# Patient Record
Sex: Female | Born: 1965 | Race: Black or African American | Hispanic: No | Marital: Married | Smoking: Never smoker
Health system: Southern US, Community
[De-identification: ages and names within clinical notes are randomized; demographics above are authoritative.]

## PROBLEM LIST (undated history)

## (undated) ENCOUNTER — Emergency Department (HOSPITAL_COMMUNITY): Admission: EM | Payer: Medicaid Other | Source: Home / Self Care

## (undated) DIAGNOSIS — N393 Stress incontinence (female) (male): Secondary | ICD-10-CM

## (undated) DIAGNOSIS — C801 Malignant (primary) neoplasm, unspecified: Secondary | ICD-10-CM

## (undated) DIAGNOSIS — N200 Calculus of kidney: Secondary | ICD-10-CM

## (undated) DIAGNOSIS — R200 Anesthesia of skin: Secondary | ICD-10-CM

## (undated) DIAGNOSIS — D869 Sarcoidosis, unspecified: Secondary | ICD-10-CM

## (undated) DIAGNOSIS — R87619 Unspecified abnormal cytological findings in specimens from cervix uteri: Secondary | ICD-10-CM

## (undated) DIAGNOSIS — E785 Hyperlipidemia, unspecified: Secondary | ICD-10-CM

## (undated) DIAGNOSIS — R269 Unspecified abnormalities of gait and mobility: Secondary | ICD-10-CM

## (undated) DIAGNOSIS — E119 Type 2 diabetes mellitus without complications: Secondary | ICD-10-CM

## (undated) DIAGNOSIS — R51 Headache: Secondary | ICD-10-CM

## (undated) DIAGNOSIS — G4700 Insomnia, unspecified: Secondary | ICD-10-CM

## (undated) DIAGNOSIS — R0789 Other chest pain: Secondary | ICD-10-CM

## (undated) DIAGNOSIS — G629 Polyneuropathy, unspecified: Secondary | ICD-10-CM

## (undated) DIAGNOSIS — M5126 Other intervertebral disc displacement, lumbar region: Secondary | ICD-10-CM

## (undated) DIAGNOSIS — R5382 Chronic fatigue, unspecified: Secondary | ICD-10-CM

## (undated) DIAGNOSIS — E669 Obesity, unspecified: Secondary | ICD-10-CM

## (undated) DIAGNOSIS — D649 Anemia, unspecified: Secondary | ICD-10-CM

## (undated) DIAGNOSIS — E039 Hypothyroidism, unspecified: Secondary | ICD-10-CM

## (undated) DIAGNOSIS — M1990 Unspecified osteoarthritis, unspecified site: Secondary | ICD-10-CM

## (undated) DIAGNOSIS — G4733 Obstructive sleep apnea (adult) (pediatric): Secondary | ICD-10-CM

## (undated) DIAGNOSIS — Z8739 Personal history of other diseases of the musculoskeletal system and connective tissue: Secondary | ICD-10-CM

## (undated) DIAGNOSIS — Z87442 Personal history of urinary calculi: Secondary | ICD-10-CM

## (undated) DIAGNOSIS — Z973 Presence of spectacles and contact lenses: Secondary | ICD-10-CM

## (undated) DIAGNOSIS — M79662 Pain in left lower leg: Secondary | ICD-10-CM

## (undated) DIAGNOSIS — R109 Unspecified abdominal pain: Secondary | ICD-10-CM

## (undated) DIAGNOSIS — I428 Other cardiomyopathies: Secondary | ICD-10-CM

## (undated) DIAGNOSIS — I10 Essential (primary) hypertension: Secondary | ICD-10-CM

## (undated) DIAGNOSIS — R9431 Abnormal electrocardiogram [ECG] [EKG]: Secondary | ICD-10-CM

## (undated) DIAGNOSIS — G43709 Chronic migraine without aura, not intractable, without status migrainosus: Secondary | ICD-10-CM

## (undated) DIAGNOSIS — K5909 Other constipation: Secondary | ICD-10-CM

## (undated) DIAGNOSIS — K219 Gastro-esophageal reflux disease without esophagitis: Secondary | ICD-10-CM

## (undated) HISTORY — PX: THYROIDECTOMY: SHX17

## (undated) HISTORY — DX: Gastro-esophageal reflux disease without esophagitis: K21.9

## (undated) HISTORY — PX: OTHER SURGICAL HISTORY: SHX169

## (undated) HISTORY — DX: Hyperlipidemia, unspecified: E78.5

## (undated) HISTORY — DX: Essential (primary) hypertension: I10

## (undated) HISTORY — DX: Obstructive sleep apnea (adult) (pediatric): G47.33

## (undated) HISTORY — DX: Unspecified abnormalities of gait and mobility: R26.9

## (undated) HISTORY — DX: Polyneuropathy, unspecified: G62.9

## (undated) HISTORY — DX: Personal history of other diseases of the musculoskeletal system and connective tissue: Z87.39

## (undated) HISTORY — DX: Type 2 diabetes mellitus without complications: E11.9

## (undated) HISTORY — PX: BREAST BIOPSY: SHX20

## (undated) HISTORY — DX: Other chest pain: R07.89

## (undated) HISTORY — DX: Unspecified abdominal pain: R10.9

## (undated) HISTORY — PX: BREAST EXCISIONAL BIOPSY: SUR124

## (undated) HISTORY — DX: Insomnia, unspecified: G47.00

## (undated) HISTORY — PX: ROTATOR CUFF REPAIR: SHX139

## (undated) HISTORY — DX: Other cardiomyopathies: I42.8

## (undated) HISTORY — DX: Anemia, unspecified: D64.9

## (undated) HISTORY — DX: Abnormal electrocardiogram (ECG) (EKG): R94.31

## (undated) HISTORY — DX: Chronic fatigue, unspecified: R53.82

## (undated) HISTORY — PX: BREAST SURGERY: SHX581

## (undated) HISTORY — PX: HYSTEROSCOPY: SHX211

## (undated) HISTORY — DX: Sarcoidosis, unspecified: D86.9

## (undated) HISTORY — DX: Unspecified abnormal cytological findings in specimens from cervix uteri: R87.619

## (undated) HISTORY — DX: Calculus of kidney: N20.0

## (undated) HISTORY — DX: Headache: R51

## (undated) HISTORY — PX: DILATION AND CURETTAGE OF UTERUS: SHX78

## (undated) HISTORY — DX: Obesity, unspecified: E66.9

---
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
## 1998-07-12 ENCOUNTER — Other Ambulatory Visit: Admission: RE | Admit: 1998-07-12 | Discharge: 1998-07-12 | Payer: Self-pay | Admitting: Obstetrics

## 1998-09-29 ENCOUNTER — Emergency Department (HOSPITAL_COMMUNITY): Admission: EM | Admit: 1998-09-29 | Discharge: 1998-09-29 | Payer: Self-pay | Admitting: *Deleted

## 1999-05-07 ENCOUNTER — Other Ambulatory Visit: Admission: RE | Admit: 1999-05-07 | Discharge: 1999-05-07 | Payer: Self-pay | Admitting: Obstetrics

## 1999-06-29 ENCOUNTER — Emergency Department (HOSPITAL_COMMUNITY): Admission: EM | Admit: 1999-06-29 | Discharge: 1999-06-29 | Payer: Self-pay | Admitting: Emergency Medicine

## 1999-08-13 ENCOUNTER — Ambulatory Visit (HOSPITAL_COMMUNITY): Admission: RE | Admit: 1999-08-13 | Discharge: 1999-08-13 | Payer: Self-pay | Admitting: Neurosurgery

## 1999-08-13 ENCOUNTER — Encounter: Payer: Self-pay | Admitting: Neurosurgery

## 1999-10-29 ENCOUNTER — Encounter: Payer: Self-pay | Admitting: Neurosurgery

## 1999-10-31 ENCOUNTER — Inpatient Hospital Stay (HOSPITAL_COMMUNITY): Admission: RE | Admit: 1999-10-31 | Discharge: 1999-11-13 | Payer: Self-pay | Admitting: Neurosurgery

## 1999-10-31 ENCOUNTER — Encounter (INDEPENDENT_AMBULATORY_CARE_PROVIDER_SITE_OTHER): Payer: Self-pay | Admitting: *Deleted

## 1999-10-31 ENCOUNTER — Encounter: Payer: Self-pay | Admitting: Neurosurgery

## 1999-12-02 HISTORY — PX: BRAIN SURGERY: SHX531

## 1999-12-17 ENCOUNTER — Ambulatory Visit (HOSPITAL_COMMUNITY): Admission: RE | Admit: 1999-12-17 | Discharge: 1999-12-17 | Payer: Self-pay | Admitting: Neurosurgery

## 1999-12-17 ENCOUNTER — Encounter: Payer: Self-pay | Admitting: Neurosurgery

## 1999-12-21 ENCOUNTER — Emergency Department (HOSPITAL_COMMUNITY): Admission: EM | Admit: 1999-12-21 | Discharge: 1999-12-21 | Payer: Self-pay | Admitting: Emergency Medicine

## 1999-12-22 ENCOUNTER — Encounter: Payer: Self-pay | Admitting: Emergency Medicine

## 2000-01-08 ENCOUNTER — Encounter: Admission: RE | Admit: 2000-01-08 | Discharge: 2000-01-08 | Payer: Self-pay | Admitting: Psychiatry

## 2000-01-09 ENCOUNTER — Emergency Department (HOSPITAL_COMMUNITY): Admission: EM | Admit: 2000-01-09 | Discharge: 2000-01-09 | Payer: Self-pay | Admitting: Emergency Medicine

## 2000-01-10 ENCOUNTER — Emergency Department (HOSPITAL_COMMUNITY): Admission: EM | Admit: 2000-01-10 | Discharge: 2000-01-10 | Payer: Self-pay | Admitting: Emergency Medicine

## 2000-01-10 ENCOUNTER — Encounter: Payer: Self-pay | Admitting: Emergency Medicine

## 2000-01-13 ENCOUNTER — Encounter: Admission: RE | Admit: 2000-01-13 | Discharge: 2000-01-13 | Payer: Self-pay | Admitting: Internal Medicine

## 2000-01-21 ENCOUNTER — Encounter: Admission: RE | Admit: 2000-01-21 | Discharge: 2000-04-20 | Payer: Self-pay | Admitting: Psychiatry

## 2000-01-24 ENCOUNTER — Emergency Department (HOSPITAL_COMMUNITY): Admission: EM | Admit: 2000-01-24 | Discharge: 2000-01-24 | Payer: Self-pay | Admitting: *Deleted

## 2000-01-24 ENCOUNTER — Encounter: Payer: Self-pay | Admitting: Emergency Medicine

## 2000-02-03 ENCOUNTER — Encounter: Admission: RE | Admit: 2000-02-03 | Discharge: 2000-02-03 | Payer: Self-pay | Admitting: Internal Medicine

## 2000-02-10 ENCOUNTER — Encounter: Admission: RE | Admit: 2000-02-10 | Discharge: 2000-05-10 | Payer: Self-pay

## 2000-02-17 ENCOUNTER — Ambulatory Visit (HOSPITAL_COMMUNITY): Admission: RE | Admit: 2000-02-17 | Discharge: 2000-02-17 | Payer: Self-pay | Admitting: Internal Medicine

## 2000-02-17 ENCOUNTER — Encounter: Payer: Self-pay | Admitting: Internal Medicine

## 2000-02-17 ENCOUNTER — Encounter: Admission: RE | Admit: 2000-02-17 | Discharge: 2000-02-17 | Payer: Self-pay | Admitting: Internal Medicine

## 2000-02-21 ENCOUNTER — Ambulatory Visit (HOSPITAL_COMMUNITY): Admission: RE | Admit: 2000-02-21 | Discharge: 2000-02-21 | Payer: Self-pay | Admitting: *Deleted

## 2000-02-28 ENCOUNTER — Ambulatory Visit (HOSPITAL_COMMUNITY): Admission: RE | Admit: 2000-02-28 | Discharge: 2000-02-28 | Payer: Self-pay | Admitting: Pulmonary Disease

## 2000-04-02 ENCOUNTER — Encounter: Admission: RE | Admit: 2000-04-02 | Discharge: 2000-04-02 | Payer: Self-pay | Admitting: Hematology and Oncology

## 2000-04-24 ENCOUNTER — Ambulatory Visit (HOSPITAL_COMMUNITY): Admission: RE | Admit: 2000-04-24 | Discharge: 2000-04-24 | Payer: Self-pay | Admitting: Psychiatry

## 2000-05-13 ENCOUNTER — Encounter: Admission: RE | Admit: 2000-05-13 | Discharge: 2000-05-13 | Payer: Self-pay | Admitting: Hematology and Oncology

## 2000-06-01 ENCOUNTER — Encounter: Admission: RE | Admit: 2000-06-01 | Discharge: 2000-06-01 | Payer: Self-pay

## 2000-06-25 ENCOUNTER — Other Ambulatory Visit: Admission: RE | Admit: 2000-06-25 | Discharge: 2000-06-25 | Payer: Self-pay | Admitting: Obstetrics

## 2000-07-14 ENCOUNTER — Encounter: Payer: Self-pay | Admitting: Neurology

## 2000-07-14 ENCOUNTER — Ambulatory Visit (HOSPITAL_COMMUNITY): Admission: RE | Admit: 2000-07-14 | Discharge: 2000-07-14 | Payer: Self-pay | Admitting: Neurology

## 2000-08-13 ENCOUNTER — Encounter: Admission: RE | Admit: 2000-08-13 | Discharge: 2000-08-13 | Payer: Self-pay | Admitting: Hematology and Oncology

## 2000-09-01 ENCOUNTER — Encounter: Admission: RE | Admit: 2000-09-01 | Discharge: 2000-09-01 | Payer: Self-pay | Admitting: Internal Medicine

## 2000-10-01 DIAGNOSIS — D8689 Sarcoidosis of other sites: Secondary | ICD-10-CM

## 2000-10-01 HISTORY — DX: Sarcoidosis of other sites: D86.89

## 2000-10-30 HISTORY — PX: CRANIOTOMY POSTERIOR FOSSA: SUR344

## 2000-12-26 ENCOUNTER — Encounter: Payer: Self-pay | Admitting: Neurology

## 2000-12-26 ENCOUNTER — Ambulatory Visit (HOSPITAL_COMMUNITY): Admission: RE | Admit: 2000-12-26 | Discharge: 2000-12-26 | Payer: Self-pay | Admitting: Neurology

## 2001-04-17 ENCOUNTER — Ambulatory Visit (HOSPITAL_COMMUNITY): Admission: RE | Admit: 2001-04-17 | Discharge: 2001-04-17 | Payer: Self-pay | Admitting: Neurology

## 2001-04-17 ENCOUNTER — Encounter: Payer: Self-pay | Admitting: Neurology

## 2001-04-26 ENCOUNTER — Encounter: Payer: Self-pay | Admitting: Neurology

## 2001-04-26 ENCOUNTER — Ambulatory Visit (HOSPITAL_COMMUNITY): Admission: RE | Admit: 2001-04-26 | Discharge: 2001-04-26 | Payer: Self-pay | Admitting: Neurology

## 2001-07-09 ENCOUNTER — Ambulatory Visit: Admission: RE | Admit: 2001-07-09 | Discharge: 2001-07-09 | Payer: Self-pay | Admitting: Pulmonary Disease

## 2001-08-04 ENCOUNTER — Emergency Department (HOSPITAL_COMMUNITY): Admission: EM | Admit: 2001-08-04 | Discharge: 2001-08-04 | Payer: Self-pay | Admitting: Emergency Medicine

## 2001-09-09 ENCOUNTER — Encounter: Admission: RE | Admit: 2001-09-09 | Discharge: 2001-09-09 | Payer: Self-pay

## 2001-09-14 ENCOUNTER — Encounter: Payer: Self-pay | Admitting: Internal Medicine

## 2001-09-14 ENCOUNTER — Encounter: Admission: RE | Admit: 2001-09-14 | Discharge: 2001-09-14 | Payer: Self-pay | Admitting: Internal Medicine

## 2001-10-11 ENCOUNTER — Encounter: Admission: RE | Admit: 2001-10-11 | Discharge: 2001-10-11 | Payer: Self-pay

## 2001-10-12 ENCOUNTER — Encounter: Admission: RE | Admit: 2001-10-12 | Discharge: 2001-10-12 | Payer: Self-pay | Admitting: Internal Medicine

## 2001-10-21 ENCOUNTER — Ambulatory Visit (HOSPITAL_COMMUNITY): Admission: RE | Admit: 2001-10-21 | Discharge: 2001-10-21 | Payer: Self-pay | Admitting: Neurology

## 2001-10-21 ENCOUNTER — Encounter: Payer: Self-pay | Admitting: Neurology

## 2001-12-01 DIAGNOSIS — Z87898 Personal history of other specified conditions: Secondary | ICD-10-CM

## 2001-12-01 HISTORY — DX: Personal history of other specified conditions: Z87.898

## 2001-12-16 ENCOUNTER — Encounter: Admission: RE | Admit: 2001-12-16 | Discharge: 2001-12-16 | Payer: Self-pay

## 2002-01-22 ENCOUNTER — Encounter: Admission: RE | Admit: 2002-01-22 | Discharge: 2002-03-04 | Payer: Self-pay | Admitting: Internal Medicine

## 2002-02-15 ENCOUNTER — Encounter: Admission: RE | Admit: 2002-02-15 | Discharge: 2002-02-15 | Payer: Self-pay | Admitting: Internal Medicine

## 2002-05-16 ENCOUNTER — Encounter: Payer: Self-pay | Admitting: Neurology

## 2002-05-16 ENCOUNTER — Emergency Department (HOSPITAL_COMMUNITY): Admission: EM | Admit: 2002-05-16 | Discharge: 2002-05-16 | Payer: Self-pay

## 2002-05-16 ENCOUNTER — Ambulatory Visit (HOSPITAL_COMMUNITY): Admission: RE | Admit: 2002-05-16 | Discharge: 2002-05-16 | Payer: Self-pay | Admitting: Neurology

## 2002-05-16 ENCOUNTER — Encounter: Payer: Self-pay | Admitting: Emergency Medicine

## 2002-07-04 ENCOUNTER — Encounter: Admission: RE | Admit: 2002-07-04 | Discharge: 2002-07-04 | Payer: Self-pay | Admitting: Internal Medicine

## 2002-07-04 ENCOUNTER — Ambulatory Visit (HOSPITAL_COMMUNITY): Admission: RE | Admit: 2002-07-04 | Discharge: 2002-07-04 | Payer: Self-pay | Admitting: Internal Medicine

## 2002-08-17 ENCOUNTER — Ambulatory Visit (HOSPITAL_COMMUNITY): Admission: RE | Admit: 2002-08-17 | Discharge: 2002-08-17 | Payer: Self-pay | Admitting: Neurology

## 2002-08-17 ENCOUNTER — Encounter: Payer: Self-pay | Admitting: Neurology

## 2002-08-24 ENCOUNTER — Ambulatory Visit (HOSPITAL_COMMUNITY): Admission: RE | Admit: 2002-08-24 | Discharge: 2002-08-24 | Payer: Self-pay | Admitting: Cardiology

## 2002-08-24 ENCOUNTER — Inpatient Hospital Stay (HOSPITAL_COMMUNITY): Admission: AD | Admit: 2002-08-24 | Discharge: 2002-08-25 | Payer: Self-pay | Admitting: Cardiology

## 2002-08-24 ENCOUNTER — Encounter: Payer: Self-pay | Admitting: Cardiology

## 2002-11-21 ENCOUNTER — Encounter: Payer: Self-pay | Admitting: Obstetrics

## 2002-11-21 ENCOUNTER — Ambulatory Visit (HOSPITAL_COMMUNITY): Admission: RE | Admit: 2002-11-21 | Discharge: 2002-11-21 | Payer: Self-pay | Admitting: Obstetrics

## 2002-11-27 ENCOUNTER — Encounter: Payer: Self-pay | Admitting: *Deleted

## 2002-11-27 ENCOUNTER — Emergency Department (HOSPITAL_COMMUNITY): Admission: EM | Admit: 2002-11-27 | Discharge: 2002-11-27 | Payer: Self-pay | Admitting: *Deleted

## 2002-11-29 ENCOUNTER — Emergency Department (HOSPITAL_COMMUNITY): Admission: EM | Admit: 2002-11-29 | Discharge: 2002-11-29 | Payer: Self-pay | Admitting: *Deleted

## 2002-11-29 ENCOUNTER — Encounter: Payer: Self-pay | Admitting: *Deleted

## 2002-12-02 ENCOUNTER — Encounter: Admission: RE | Admit: 2002-12-02 | Discharge: 2002-12-02 | Payer: Self-pay | Admitting: Internal Medicine

## 2002-12-09 ENCOUNTER — Emergency Department (HOSPITAL_COMMUNITY): Admission: EM | Admit: 2002-12-09 | Discharge: 2002-12-09 | Payer: Self-pay | Admitting: Emergency Medicine

## 2002-12-21 ENCOUNTER — Ambulatory Visit (HOSPITAL_BASED_OUTPATIENT_CLINIC_OR_DEPARTMENT_OTHER): Admission: RE | Admit: 2002-12-21 | Discharge: 2002-12-21 | Payer: Self-pay | Admitting: Neurology

## 2003-01-06 ENCOUNTER — Ambulatory Visit (HOSPITAL_COMMUNITY): Admission: RE | Admit: 2003-01-06 | Discharge: 2003-01-06 | Payer: Self-pay | Admitting: Cardiology

## 2003-03-22 ENCOUNTER — Emergency Department (HOSPITAL_COMMUNITY): Admission: EM | Admit: 2003-03-22 | Discharge: 2003-03-22 | Payer: Self-pay | Admitting: Emergency Medicine

## 2003-03-23 ENCOUNTER — Encounter: Payer: Self-pay | Admitting: Emergency Medicine

## 2003-04-17 ENCOUNTER — Encounter: Admission: RE | Admit: 2003-04-17 | Discharge: 2003-04-17 | Payer: Self-pay | Admitting: Internal Medicine

## 2003-05-08 ENCOUNTER — Ambulatory Visit (HOSPITAL_COMMUNITY): Admission: RE | Admit: 2003-05-08 | Discharge: 2003-05-08 | Payer: Self-pay | Admitting: Neurology

## 2003-05-08 ENCOUNTER — Encounter: Payer: Self-pay | Admitting: Neurology

## 2003-05-11 ENCOUNTER — Ambulatory Visit (HOSPITAL_COMMUNITY): Admission: RE | Admit: 2003-05-11 | Discharge: 2003-05-11 | Payer: Self-pay | Admitting: Gastroenterology

## 2003-05-11 ENCOUNTER — Encounter: Payer: Self-pay | Admitting: Gastroenterology

## 2003-05-30 ENCOUNTER — Encounter: Admission: RE | Admit: 2003-05-30 | Discharge: 2003-05-30 | Payer: Self-pay | Admitting: Internal Medicine

## 2003-06-21 ENCOUNTER — Ambulatory Visit (HOSPITAL_COMMUNITY): Admission: RE | Admit: 2003-06-21 | Discharge: 2003-06-21 | Payer: Self-pay | Admitting: Gastroenterology

## 2003-06-21 ENCOUNTER — Encounter: Payer: Self-pay | Admitting: Gastroenterology

## 2003-07-19 ENCOUNTER — Encounter: Admission: RE | Admit: 2003-07-19 | Discharge: 2003-07-19 | Payer: Self-pay | Admitting: Internal Medicine

## 2003-08-23 ENCOUNTER — Encounter: Admission: RE | Admit: 2003-08-23 | Discharge: 2003-08-23 | Payer: Self-pay | Admitting: Internal Medicine

## 2003-09-19 ENCOUNTER — Emergency Department (HOSPITAL_COMMUNITY): Admission: EM | Admit: 2003-09-19 | Discharge: 2003-09-19 | Payer: Self-pay | Admitting: Emergency Medicine

## 2003-09-19 ENCOUNTER — Encounter: Payer: Self-pay | Admitting: Emergency Medicine

## 2003-09-25 ENCOUNTER — Encounter: Payer: Self-pay | Admitting: Neurology

## 2003-09-25 ENCOUNTER — Encounter: Admission: RE | Admit: 2003-09-25 | Discharge: 2003-09-25 | Payer: Self-pay | Admitting: Obstetrics

## 2003-11-11 ENCOUNTER — Emergency Department (HOSPITAL_COMMUNITY): Admission: EM | Admit: 2003-11-11 | Discharge: 2003-11-11 | Payer: Self-pay | Admitting: Emergency Medicine

## 2004-03-21 ENCOUNTER — Ambulatory Visit (HOSPITAL_COMMUNITY): Admission: RE | Admit: 2004-03-21 | Discharge: 2004-03-21 | Payer: Self-pay | Admitting: Obstetrics

## 2004-03-25 ENCOUNTER — Encounter: Admission: RE | Admit: 2004-03-25 | Discharge: 2004-03-25 | Payer: Self-pay | Admitting: Internal Medicine

## 2004-05-08 ENCOUNTER — Ambulatory Visit (HOSPITAL_COMMUNITY): Admission: RE | Admit: 2004-05-08 | Discharge: 2004-05-08 | Payer: Self-pay | Admitting: Internal Medicine

## 2004-05-08 ENCOUNTER — Encounter: Admission: RE | Admit: 2004-05-08 | Discharge: 2004-05-08 | Payer: Self-pay | Admitting: Internal Medicine

## 2004-07-15 ENCOUNTER — Ambulatory Visit (HOSPITAL_COMMUNITY): Admission: RE | Admit: 2004-07-15 | Discharge: 2004-07-15 | Payer: Self-pay | Admitting: Neurology

## 2004-07-24 ENCOUNTER — Encounter: Admission: RE | Admit: 2004-07-24 | Discharge: 2004-07-24 | Payer: Self-pay | Admitting: Internal Medicine

## 2004-07-24 ENCOUNTER — Ambulatory Visit (HOSPITAL_COMMUNITY): Admission: RE | Admit: 2004-07-24 | Discharge: 2004-07-24 | Payer: Self-pay | Admitting: Internal Medicine

## 2004-10-15 ENCOUNTER — Ambulatory Visit: Payer: Self-pay | Admitting: Internal Medicine

## 2004-11-19 ENCOUNTER — Ambulatory Visit: Payer: Self-pay | Admitting: Cardiology

## 2004-12-17 ENCOUNTER — Ambulatory Visit: Payer: Self-pay | Admitting: Pulmonary Disease

## 2004-12-19 ENCOUNTER — Ambulatory Visit: Payer: Self-pay | Admitting: Internal Medicine

## 2005-01-01 ENCOUNTER — Ambulatory Visit: Payer: Self-pay | Admitting: Cardiology

## 2005-01-10 ENCOUNTER — Emergency Department (HOSPITAL_COMMUNITY): Admission: EM | Admit: 2005-01-10 | Discharge: 2005-01-10 | Payer: Self-pay | Admitting: Emergency Medicine

## 2005-01-17 ENCOUNTER — Ambulatory Visit: Payer: Self-pay | Admitting: Pulmonary Disease

## 2005-02-04 ENCOUNTER — Emergency Department (HOSPITAL_COMMUNITY): Admission: EM | Admit: 2005-02-04 | Discharge: 2005-02-04 | Payer: Self-pay | Admitting: *Deleted

## 2005-04-13 ENCOUNTER — Ambulatory Visit (HOSPITAL_COMMUNITY): Admission: RE | Admit: 2005-04-13 | Discharge: 2005-04-13 | Payer: Self-pay | Admitting: Neurology

## 2005-04-16 ENCOUNTER — Encounter: Admission: RE | Admit: 2005-04-16 | Discharge: 2005-05-07 | Payer: Self-pay | Admitting: Neurology

## 2005-04-22 ENCOUNTER — Ambulatory Visit: Payer: Self-pay | Admitting: Internal Medicine

## 2005-04-22 ENCOUNTER — Ambulatory Visit (HOSPITAL_COMMUNITY): Admission: RE | Admit: 2005-04-22 | Discharge: 2005-04-22 | Payer: Self-pay | Admitting: Internal Medicine

## 2005-04-23 ENCOUNTER — Ambulatory Visit: Payer: Self-pay | Admitting: Internal Medicine

## 2005-06-05 ENCOUNTER — Ambulatory Visit: Payer: Self-pay | Admitting: Cardiology

## 2005-06-10 ENCOUNTER — Ambulatory Visit: Payer: Self-pay

## 2005-06-12 ENCOUNTER — Emergency Department (HOSPITAL_COMMUNITY): Admission: EM | Admit: 2005-06-12 | Discharge: 2005-06-12 | Payer: Self-pay | Admitting: Emergency Medicine

## 2005-08-01 ENCOUNTER — Ambulatory Visit: Payer: Self-pay | Admitting: Internal Medicine

## 2005-09-15 ENCOUNTER — Ambulatory Visit: Payer: Self-pay | Admitting: Internal Medicine

## 2005-11-22 ENCOUNTER — Emergency Department (HOSPITAL_COMMUNITY): Admission: EM | Admit: 2005-11-22 | Discharge: 2005-11-22 | Payer: Self-pay | Admitting: Emergency Medicine

## 2005-11-23 ENCOUNTER — Emergency Department (HOSPITAL_COMMUNITY): Admission: EM | Admit: 2005-11-23 | Discharge: 2005-11-23 | Payer: Self-pay | Admitting: Emergency Medicine

## 2005-11-26 ENCOUNTER — Ambulatory Visit: Payer: Self-pay | Admitting: Pulmonary Disease

## 2005-12-01 ENCOUNTER — Emergency Department (HOSPITAL_COMMUNITY): Admission: EM | Admit: 2005-12-01 | Discharge: 2005-12-01 | Payer: Self-pay | Admitting: Emergency Medicine

## 2005-12-04 ENCOUNTER — Ambulatory Visit: Payer: Self-pay | Admitting: Internal Medicine

## 2005-12-11 ENCOUNTER — Ambulatory Visit: Payer: Self-pay | Admitting: Cardiology

## 2006-02-01 ENCOUNTER — Emergency Department (HOSPITAL_COMMUNITY): Admission: EM | Admit: 2006-02-01 | Discharge: 2006-02-01 | Payer: Self-pay | Admitting: Emergency Medicine

## 2006-02-05 ENCOUNTER — Ambulatory Visit: Payer: Self-pay | Admitting: Internal Medicine

## 2006-02-06 ENCOUNTER — Ambulatory Visit: Payer: Self-pay | Admitting: Internal Medicine

## 2006-02-19 ENCOUNTER — Ambulatory Visit: Payer: Self-pay | Admitting: Hospitalist

## 2006-02-20 ENCOUNTER — Emergency Department (HOSPITAL_COMMUNITY): Admission: EM | Admit: 2006-02-20 | Discharge: 2006-02-20 | Payer: Self-pay | Admitting: Emergency Medicine

## 2006-03-13 ENCOUNTER — Ambulatory Visit (HOSPITAL_BASED_OUTPATIENT_CLINIC_OR_DEPARTMENT_OTHER): Admission: RE | Admit: 2006-03-13 | Discharge: 2006-03-13 | Payer: Self-pay | Admitting: Urology

## 2006-03-13 HISTORY — PX: CYSTOSCOPY/RETROGRADE/URETEROSCOPY/STONE EXTRACTION WITH BASKET: SHX5317

## 2006-03-17 ENCOUNTER — Encounter: Admission: RE | Admit: 2006-03-17 | Discharge: 2006-03-17 | Payer: Self-pay | Admitting: Neurology

## 2006-03-25 ENCOUNTER — Ambulatory Visit: Payer: Self-pay | Admitting: Cardiology

## 2006-04-01 ENCOUNTER — Ambulatory Visit: Payer: Self-pay

## 2006-04-22 ENCOUNTER — Ambulatory Visit: Payer: Self-pay | Admitting: Pulmonary Disease

## 2006-05-06 ENCOUNTER — Ambulatory Visit: Payer: Self-pay | Admitting: Pulmonary Disease

## 2006-05-14 ENCOUNTER — Ambulatory Visit: Payer: Self-pay | Admitting: Internal Medicine

## 2006-05-14 ENCOUNTER — Ambulatory Visit (HOSPITAL_COMMUNITY): Admission: RE | Admit: 2006-05-14 | Discharge: 2006-05-14 | Payer: Self-pay | Admitting: Internal Medicine

## 2006-07-02 ENCOUNTER — Ambulatory Visit: Payer: Self-pay | Admitting: Pulmonary Disease

## 2006-08-04 ENCOUNTER — Ambulatory Visit: Payer: Self-pay | Admitting: Hospitalist

## 2006-08-05 ENCOUNTER — Ambulatory Visit (HOSPITAL_COMMUNITY): Admission: RE | Admit: 2006-08-05 | Discharge: 2006-08-05 | Payer: Self-pay | Admitting: Internal Medicine

## 2006-08-18 ENCOUNTER — Ambulatory Visit: Payer: Self-pay | Admitting: Internal Medicine

## 2006-08-20 ENCOUNTER — Ambulatory Visit (HOSPITAL_COMMUNITY): Admission: RE | Admit: 2006-08-20 | Discharge: 2006-08-20 | Payer: Self-pay | Admitting: Internal Medicine

## 2006-09-05 ENCOUNTER — Emergency Department (HOSPITAL_COMMUNITY): Admission: EM | Admit: 2006-09-05 | Discharge: 2006-09-05 | Payer: Self-pay | Admitting: Emergency Medicine

## 2006-09-07 ENCOUNTER — Ambulatory Visit: Payer: Self-pay | Admitting: Internal Medicine

## 2006-09-07 ENCOUNTER — Encounter: Admission: RE | Admit: 2006-09-07 | Discharge: 2006-09-07 | Payer: Self-pay | Admitting: Neurology

## 2006-09-26 ENCOUNTER — Encounter: Admission: RE | Admit: 2006-09-26 | Discharge: 2006-09-26 | Payer: Self-pay | Admitting: Neurology

## 2006-10-20 ENCOUNTER — Ambulatory Visit: Payer: Self-pay | Admitting: Internal Medicine

## 2006-10-20 LAB — CONVERTED CEMR LAB
ALT: 16 units/L (ref 0–35)
AST: 16 units/L (ref 0–37)
Albumin: 3.8 g/dL (ref 3.5–5.2)
Alkaline Phosphatase: 68 units/L (ref 39–117)
BUN: 15 mg/dL (ref 6–23)
Basophils Absolute: 0 10*3/uL (ref 0.0–0.1)
Basophils Relative: 1 % (ref 0–1)
CO2: 27 meq/L (ref 19–32)
Calcium: 8.8 mg/dL (ref 8.4–10.5)
Chloride: 105 meq/L (ref 96–112)
Creatinine, Ser: 0.74 mg/dL (ref 0.40–1.20)
Eosinophils Relative: 3 % (ref 0–4)
Glucose, Bld: 106 mg/dL — ABNORMAL HIGH (ref 70–99)
HCT: 29.2 % — ABNORMAL LOW (ref 34.4–43.3)
Hemoglobin: 8.4 g/dL — ABNORMAL LOW (ref 11.7–14.8)
Lymphocytes Relative: 35 % (ref 15–43)
Lymphs Abs: 2 10*3/uL (ref 0.8–3.1)
MCHC: 28.8 g/dL — ABNORMAL LOW (ref 33.1–35.4)
MCV: 73.7 fL — ABNORMAL LOW (ref 78.8–100.0)
Monocytes Absolute: 0.5 10*3/uL (ref 0.2–0.7)
Monocytes Relative: 9 % (ref 3–11)
Neutro Abs: 3.1 10*3/uL (ref 1.8–6.8)
Neutrophils Relative %: 53 % (ref 47–77)
Platelets: 525 10*3/uL — ABNORMAL HIGH (ref 152–374)
Potassium: 4.8 meq/L (ref 3.5–5.3)
RBC: 3.96 M/uL (ref 3.79–4.96)
RDW: 17.5 % — ABNORMAL HIGH (ref 11.5–15.3)
Sodium: 141 meq/L (ref 135–145)
Total Bilirubin: 0.2 mg/dL — ABNORMAL LOW (ref 0.3–1.2)
Total Protein: 7.4 g/dL (ref 6.0–8.3)
WBC: 5.8 10*3/uL (ref 3.7–10.0)

## 2006-10-29 ENCOUNTER — Ambulatory Visit: Payer: Self-pay | Admitting: Internal Medicine

## 2006-10-29 ENCOUNTER — Encounter (INDEPENDENT_AMBULATORY_CARE_PROVIDER_SITE_OTHER): Payer: Self-pay | Admitting: Internal Medicine

## 2006-10-29 LAB — CONVERTED CEMR LAB
Basophils Absolute: 0 10*3/uL (ref 0.0–0.1)
Basophils Relative: 0 % (ref 0–1)
Eosinophils Relative: 2 % (ref 0–4)
Ferritin: 2 ng/mL — ABNORMAL LOW (ref 10–291)
HCT: 25.9 % — ABNORMAL LOW (ref 34.4–43.3)
Hemoglobin: 8 g/dL — ABNORMAL LOW (ref 11.7–14.8)
Iron: 16 ug/dL — ABNORMAL LOW (ref 42–145)
LDH: 170 units/L (ref 94–250)
Lymphocytes Relative: 28 % (ref 15–43)
Lymphs Abs: 1.5 10*3/uL (ref 0.8–3.1)
MCHC: 30.9 g/dL — ABNORMAL LOW (ref 33.1–35.4)
MCV: 69.7 fL — ABNORMAL LOW (ref 78.8–100.0)
Monocytes Absolute: 0.3 10*3/uL (ref 0.2–0.7)
Monocytes Relative: 6 % (ref 3–11)
Neutro Abs: 3.6 10*3/uL (ref 1.8–6.8)
Neutrophils Relative %: 64 % (ref 47–77)
Platelets: 509 10*3/uL — ABNORMAL HIGH (ref 152–374)
RBC Folate: 1239 ng/mL — ABNORMAL HIGH (ref 180–600)
RBC: 3.72 M/uL — ABNORMAL LOW (ref 3.79–4.96)
RDW: 18.7 % — ABNORMAL HIGH (ref 11.5–15.3)
Saturation Ratios: 4 % — ABNORMAL LOW (ref 20–55)
TIBC: 396 ug/dL (ref 250–470)
UIBC: 380 ug/dL
Vitamin B-12: 793 pg/mL (ref 211–911)
WBC: 5.6 10*3/uL (ref 3.7–10.0)

## 2006-11-02 ENCOUNTER — Ambulatory Visit: Payer: Self-pay | Admitting: Internal Medicine

## 2006-11-05 ENCOUNTER — Ambulatory Visit (HOSPITAL_COMMUNITY): Admission: RE | Admit: 2006-11-05 | Discharge: 2006-11-05 | Payer: Self-pay | Admitting: Internal Medicine

## 2006-11-06 ENCOUNTER — Ambulatory Visit (HOSPITAL_COMMUNITY): Admission: RE | Admit: 2006-11-06 | Discharge: 2006-11-06 | Payer: Self-pay | Admitting: Internal Medicine

## 2006-11-19 ENCOUNTER — Ambulatory Visit: Payer: Self-pay | Admitting: Pulmonary Disease

## 2006-12-02 ENCOUNTER — Emergency Department (HOSPITAL_COMMUNITY): Admission: EM | Admit: 2006-12-02 | Discharge: 2006-12-02 | Payer: Self-pay | Admitting: Emergency Medicine

## 2006-12-11 ENCOUNTER — Ambulatory Visit: Payer: Self-pay | Admitting: Cardiology

## 2006-12-24 ENCOUNTER — Telehealth (INDEPENDENT_AMBULATORY_CARE_PROVIDER_SITE_OTHER): Payer: Self-pay | Admitting: *Deleted

## 2007-01-01 ENCOUNTER — Ambulatory Visit: Payer: Self-pay | Admitting: Pulmonary Disease

## 2007-03-17 ENCOUNTER — Ambulatory Visit: Payer: Self-pay | Admitting: Cardiology

## 2007-03-26 ENCOUNTER — Ambulatory Visit: Payer: Self-pay

## 2007-04-15 ENCOUNTER — Encounter: Admission: RE | Admit: 2007-04-15 | Discharge: 2007-04-15 | Payer: Self-pay | Admitting: Neurology

## 2007-05-05 ENCOUNTER — Ambulatory Visit: Payer: Self-pay | Admitting: Pulmonary Disease

## 2007-05-05 LAB — CONVERTED CEMR LAB
ALT: 24 units/L (ref 0–40)
AST: 22 units/L (ref 0–37)
Albumin: 3.7 g/dL (ref 3.5–5.2)
Alkaline Phosphatase: 64 units/L (ref 39–117)
BUN: 12 mg/dL (ref 6–23)
Basophils Absolute: 0.2 10*3/uL — ABNORMAL HIGH (ref 0.0–0.1)
Basophils Relative: 3.9 % — ABNORMAL HIGH (ref 0.0–1.0)
Bilirubin, Direct: 0.1 mg/dL (ref 0.0–0.3)
CO2: 26 meq/L (ref 19–32)
Calcium: 9 mg/dL (ref 8.4–10.5)
Chloride: 105 meq/L (ref 96–112)
Cortisol, Plasma: 7.7 ug/dL
Creatinine, Ser: 0.6 mg/dL (ref 0.4–1.2)
Eosinophils Absolute: 0.2 10*3/uL (ref 0.0–0.6)
Eosinophils Relative: 2.9 % (ref 0.0–5.0)
Free T4: 0.9 ng/dL (ref 0.6–1.6)
GFR calc Af Amer: 143 mL/min
GFR calc non Af Amer: 118 mL/min
Glucose, Bld: 116 mg/dL — ABNORMAL HIGH (ref 70–99)
HCT: 33.6 % — ABNORMAL LOW (ref 36.0–46.0)
Hemoglobin: 11.2 g/dL — ABNORMAL LOW (ref 12.0–15.0)
Lymphocytes Relative: 25.3 % (ref 12.0–46.0)
MCHC: 33.2 g/dL (ref 30.0–36.0)
MCV: 80 fL (ref 78.0–100.0)
Monocytes Absolute: 0.5 10*3/uL (ref 0.2–0.7)
Monocytes Relative: 8.9 % (ref 3.0–11.0)
Neutro Abs: 3.1 10*3/uL (ref 1.4–7.7)
Neutrophils Relative %: 59 % (ref 43.0–77.0)
Platelets: 310 10*3/uL (ref 150–400)
Potassium: 3.9 meq/L (ref 3.5–5.1)
RBC: 4.2 M/uL (ref 3.87–5.11)
RDW: 17.5 % — ABNORMAL HIGH (ref 11.5–14.6)
Sodium: 139 meq/L (ref 135–145)
TSH: 0.6 microintl units/mL (ref 0.35–5.50)
Total Bilirubin: 0.5 mg/dL (ref 0.3–1.2)
Total Protein: 7.1 g/dL (ref 6.0–8.3)
WBC: 5.4 10*3/uL (ref 4.5–10.5)

## 2007-05-25 ENCOUNTER — Ambulatory Visit: Payer: Self-pay | Admitting: Internal Medicine

## 2007-05-25 ENCOUNTER — Ambulatory Visit (HOSPITAL_COMMUNITY): Admission: RE | Admit: 2007-05-25 | Discharge: 2007-05-25 | Payer: Self-pay | Admitting: Obstetrics

## 2007-05-25 DIAGNOSIS — R9431 Abnormal electrocardiogram [ECG] [EKG]: Secondary | ICD-10-CM | POA: Insufficient documentation

## 2007-05-25 DIAGNOSIS — R5381 Other malaise: Secondary | ICD-10-CM | POA: Insufficient documentation

## 2007-05-25 DIAGNOSIS — D509 Iron deficiency anemia, unspecified: Secondary | ICD-10-CM | POA: Insufficient documentation

## 2007-05-25 DIAGNOSIS — R5383 Other fatigue: Secondary | ICD-10-CM

## 2007-05-25 DIAGNOSIS — E119 Type 2 diabetes mellitus without complications: Secondary | ICD-10-CM | POA: Insufficient documentation

## 2007-05-25 DIAGNOSIS — Z8739 Personal history of other diseases of the musculoskeletal system and connective tissue: Secondary | ICD-10-CM | POA: Insufficient documentation

## 2007-05-25 DIAGNOSIS — Z87442 Personal history of urinary calculi: Secondary | ICD-10-CM | POA: Insufficient documentation

## 2007-05-25 DIAGNOSIS — D8689 Sarcoidosis of other sites: Secondary | ICD-10-CM | POA: Insufficient documentation

## 2007-05-25 DIAGNOSIS — I10 Essential (primary) hypertension: Secondary | ICD-10-CM | POA: Insufficient documentation

## 2007-05-25 DIAGNOSIS — K219 Gastro-esophageal reflux disease without esophagitis: Secondary | ICD-10-CM | POA: Insufficient documentation

## 2007-05-25 DIAGNOSIS — G4733 Obstructive sleep apnea (adult) (pediatric): Secondary | ICD-10-CM | POA: Insufficient documentation

## 2007-05-25 HISTORY — DX: Personal history of other diseases of the musculoskeletal system and connective tissue: Z87.39

## 2007-05-25 LAB — CONVERTED CEMR LAB: Blood Glucose, Fingerstick: 93

## 2007-05-26 ENCOUNTER — Encounter (INDEPENDENT_AMBULATORY_CARE_PROVIDER_SITE_OTHER): Payer: Self-pay | Admitting: *Deleted

## 2007-06-01 ENCOUNTER — Ambulatory Visit: Payer: Self-pay | Admitting: Pulmonary Disease

## 2007-06-01 ENCOUNTER — Ambulatory Visit (HOSPITAL_COMMUNITY): Admission: RE | Admit: 2007-06-01 | Discharge: 2007-06-01 | Payer: Self-pay | Admitting: Obstetrics

## 2007-06-02 ENCOUNTER — Ambulatory Visit (HOSPITAL_BASED_OUTPATIENT_CLINIC_OR_DEPARTMENT_OTHER): Admission: RE | Admit: 2007-06-02 | Discharge: 2007-06-02 | Payer: Self-pay | Admitting: Pulmonary Disease

## 2007-07-15 ENCOUNTER — Telehealth: Payer: Self-pay | Admitting: *Deleted

## 2007-07-18 ENCOUNTER — Emergency Department (HOSPITAL_COMMUNITY): Admission: EM | Admit: 2007-07-18 | Discharge: 2007-07-18 | Payer: Self-pay | Admitting: Emergency Medicine

## 2007-07-28 ENCOUNTER — Ambulatory Visit (HOSPITAL_COMMUNITY): Admission: RE | Admit: 2007-07-28 | Discharge: 2007-07-28 | Payer: Self-pay | Admitting: Obstetrics

## 2007-07-28 ENCOUNTER — Encounter (INDEPENDENT_AMBULATORY_CARE_PROVIDER_SITE_OTHER): Payer: Self-pay | Admitting: Obstetrics

## 2007-07-28 HISTORY — PX: HYSTEROSCOPY W/ ENDOMETRIAL ABLATION: SUR665

## 2007-07-29 ENCOUNTER — Inpatient Hospital Stay (HOSPITAL_COMMUNITY): Admission: AD | Admit: 2007-07-29 | Discharge: 2007-07-31 | Payer: Self-pay | Admitting: Obstetrics

## 2007-08-03 ENCOUNTER — Inpatient Hospital Stay (HOSPITAL_COMMUNITY): Admission: AD | Admit: 2007-08-03 | Discharge: 2007-08-04 | Payer: Self-pay | Admitting: Obstetrics

## 2007-08-24 ENCOUNTER — Encounter (INDEPENDENT_AMBULATORY_CARE_PROVIDER_SITE_OTHER): Payer: Self-pay | Admitting: Internal Medicine

## 2007-08-24 ENCOUNTER — Ambulatory Visit: Payer: Self-pay | Admitting: Internal Medicine

## 2007-08-24 DIAGNOSIS — K5900 Constipation, unspecified: Secondary | ICD-10-CM | POA: Insufficient documentation

## 2007-08-24 LAB — CONVERTED CEMR LAB
ALT: 33 units/L (ref 0–35)
AST: 26 units/L (ref 0–37)
Albumin: 4.2 g/dL (ref 3.5–5.2)
Alkaline Phosphatase: 52 units/L (ref 39–117)
BUN: 14 mg/dL (ref 6–23)
Blood Glucose, Fingerstick: 88
CO2: 25 meq/L (ref 19–32)
Calcium: 9.1 mg/dL (ref 8.4–10.5)
Chloride: 104 meq/L (ref 96–112)
Cholesterol: 130 mg/dL (ref 0–200)
Creatinine, Ser: 0.71 mg/dL (ref 0.40–1.20)
Glucose, Bld: 69 mg/dL — ABNORMAL LOW (ref 70–99)
HCT: 38.8 % (ref 36.0–46.0)
HDL: 40 mg/dL (ref >39)
Hemoglobin: 11.9 g/dL — ABNORMAL LOW (ref 12.0–15.0)
Hgb A1c MFr Bld: 6.4 %
LDL Cholesterol: 77 mg/dL (ref 0–99)
MCHC: 30.7 g/dL (ref 30.0–36.0)
MCV: 84.7 fL (ref 78.0–100.0)
Platelets: 344 10*3/uL (ref 150–400)
Potassium: 4.3 meq/L (ref 3.5–5.3)
RBC: 4.58 M/uL (ref 3.87–5.11)
RDW: 16.8 % — ABNORMAL HIGH (ref 11.5–14.0)
Sodium: 138 meq/L (ref 135–145)
Total Bilirubin: 0.4 mg/dL (ref 0.3–1.2)
Total CHOL/HDL Ratio: 3.3
Total Protein: 7.5 g/dL (ref 6.0–8.3)
Triglycerides: 64 mg/dL (ref <150)
VLDL: 13 mg/dL (ref 0–40)
WBC: 4.9 10*3/uL (ref 4.0–10.5)

## 2007-10-07 ENCOUNTER — Telehealth: Payer: Self-pay | Admitting: *Deleted

## 2007-10-14 ENCOUNTER — Ambulatory Visit: Payer: Self-pay | Admitting: Pulmonary Disease

## 2007-10-20 ENCOUNTER — Encounter (INDEPENDENT_AMBULATORY_CARE_PROVIDER_SITE_OTHER): Payer: Self-pay | Admitting: *Deleted

## 2007-10-20 ENCOUNTER — Ambulatory Visit (HOSPITAL_COMMUNITY): Admission: RE | Admit: 2007-10-20 | Discharge: 2007-10-20 | Payer: Self-pay | Admitting: *Deleted

## 2007-10-20 ENCOUNTER — Ambulatory Visit: Payer: Self-pay | Admitting: *Deleted

## 2007-10-20 DIAGNOSIS — M5386 Other specified dorsopathies, lumbar region: Secondary | ICD-10-CM | POA: Insufficient documentation

## 2007-10-20 LAB — CONVERTED CEMR LAB
Basophils Absolute: 0.1 10*3/uL (ref 0.0–0.1)
Basophils Relative: 1 % (ref 0–1)
Blood Glucose, Fingerstick: 90
Eosinophils Absolute: 0.2 10*3/uL (ref 0.2–0.7)
Eosinophils Relative: 3 % (ref 0–5)
HCT: 35.9 % — ABNORMAL LOW (ref 36.0–46.0)
Hemoglobin: 11.6 g/dL — ABNORMAL LOW (ref 12.0–15.0)
Lymphocytes Relative: 26 % (ref 12–46)
Lymphs Abs: 1.6 10*3/uL (ref 0.7–4.0)
MCHC: 32.3 g/dL (ref 30.0–36.0)
MCV: 81.6 fL (ref 78.0–100.0)
Monocytes Absolute: 0.4 10*3/uL (ref 0.1–1.0)
Monocytes Relative: 7 % (ref 3–12)
Neutro Abs: 4.1 10*3/uL (ref 1.7–7.7)
Neutrophils Relative %: 64 % (ref 43–77)
Platelets: 389 10*3/uL (ref 150–400)
RBC: 4.4 M/uL (ref 3.87–5.11)
RDW: 15.6 % — ABNORMAL HIGH (ref 11.5–15.5)
WBC: 6.4 10*3/uL (ref 4.0–10.5)

## 2007-12-09 ENCOUNTER — Ambulatory Visit: Payer: Self-pay | Admitting: Infectious Disease

## 2007-12-09 LAB — CONVERTED CEMR LAB
Blood Glucose, Fingerstick: 280
Hgb A1c MFr Bld: 6 %

## 2008-01-08 ENCOUNTER — Encounter: Admission: RE | Admit: 2008-01-08 | Discharge: 2008-01-08 | Payer: Self-pay | Admitting: Neurology

## 2008-01-17 ENCOUNTER — Telehealth: Payer: Self-pay | Admitting: *Deleted

## 2008-03-28 ENCOUNTER — Ambulatory Visit: Payer: Self-pay | Admitting: Cardiology

## 2008-03-28 ENCOUNTER — Observation Stay (HOSPITAL_COMMUNITY): Admission: EM | Admit: 2008-03-28 | Discharge: 2008-03-29 | Payer: Self-pay | Admitting: Emergency Medicine

## 2008-04-04 ENCOUNTER — Encounter (INDEPENDENT_AMBULATORY_CARE_PROVIDER_SITE_OTHER): Payer: Self-pay | Admitting: *Deleted

## 2008-04-04 ENCOUNTER — Ambulatory Visit: Payer: Self-pay

## 2008-04-12 ENCOUNTER — Ambulatory Visit: Payer: Self-pay | Admitting: Pulmonary Disease

## 2008-04-14 ENCOUNTER — Ambulatory Visit: Payer: Self-pay | Admitting: *Deleted

## 2008-04-14 LAB — CONVERTED CEMR LAB: Hgb A1c MFr Bld: 8.7 %

## 2008-04-19 ENCOUNTER — Ambulatory Visit: Payer: Self-pay | Admitting: Cardiology

## 2008-04-21 ENCOUNTER — Ambulatory Visit: Payer: Self-pay | Admitting: Pulmonary Disease

## 2008-04-25 ENCOUNTER — Encounter: Admission: RE | Admit: 2008-04-25 | Discharge: 2008-04-25 | Payer: Self-pay | Admitting: Orthopedic Surgery

## 2008-04-27 ENCOUNTER — Telehealth: Payer: Self-pay | Admitting: Pulmonary Disease

## 2008-04-27 ENCOUNTER — Telehealth (INDEPENDENT_AMBULATORY_CARE_PROVIDER_SITE_OTHER): Payer: Self-pay | Admitting: *Deleted

## 2008-05-01 ENCOUNTER — Telehealth (INDEPENDENT_AMBULATORY_CARE_PROVIDER_SITE_OTHER): Payer: Self-pay | Admitting: *Deleted

## 2008-05-03 ENCOUNTER — Ambulatory Visit: Payer: Self-pay | Admitting: *Deleted

## 2008-05-09 ENCOUNTER — Ambulatory Visit: Payer: Self-pay | Admitting: Internal Medicine

## 2008-05-29 ENCOUNTER — Telehealth (INDEPENDENT_AMBULATORY_CARE_PROVIDER_SITE_OTHER): Payer: Self-pay | Admitting: *Deleted

## 2008-05-30 ENCOUNTER — Encounter (INDEPENDENT_AMBULATORY_CARE_PROVIDER_SITE_OTHER): Payer: Self-pay | Admitting: Internal Medicine

## 2008-05-30 ENCOUNTER — Ambulatory Visit: Payer: Self-pay | Admitting: Internal Medicine

## 2008-06-15 ENCOUNTER — Ambulatory Visit (HOSPITAL_COMMUNITY): Admission: RE | Admit: 2008-06-15 | Discharge: 2008-06-15 | Payer: Self-pay | Admitting: Obstetrics

## 2008-07-24 ENCOUNTER — Encounter: Admission: RE | Admit: 2008-07-24 | Discharge: 2008-08-24 | Payer: Self-pay | Admitting: Neurology

## 2008-08-28 ENCOUNTER — Ambulatory Visit: Payer: Self-pay | Admitting: Internal Medicine

## 2008-08-28 ENCOUNTER — Ambulatory Visit: Payer: Self-pay | Admitting: Pulmonary Disease

## 2008-08-30 ENCOUNTER — Telehealth: Payer: Self-pay | Admitting: Internal Medicine

## 2008-09-18 ENCOUNTER — Encounter: Payer: Self-pay | Admitting: Pulmonary Disease

## 2008-09-20 ENCOUNTER — Encounter: Payer: Self-pay | Admitting: Pulmonary Disease

## 2008-12-04 ENCOUNTER — Telehealth: Payer: Self-pay | Admitting: *Deleted

## 2008-12-11 ENCOUNTER — Telehealth: Payer: Self-pay | Admitting: Internal Medicine

## 2009-01-02 ENCOUNTER — Ambulatory Visit: Payer: Self-pay | Admitting: Internal Medicine

## 2009-01-02 ENCOUNTER — Encounter: Payer: Self-pay | Admitting: Internal Medicine

## 2009-01-02 LAB — CONVERTED CEMR LAB
HDL: 38 mg/dL — ABNORMAL LOW (ref >39)
LDL Cholesterol: 71 mg/dL (ref 0–99)
Total CHOL/HDL Ratio: 3.3
Triglycerides: 75 mg/dL (ref <150)
VLDL: 15 mg/dL (ref 0–40)

## 2009-01-14 ENCOUNTER — Encounter: Admission: RE | Admit: 2009-01-14 | Discharge: 2009-01-14 | Payer: Self-pay | Admitting: Neurology

## 2009-01-25 ENCOUNTER — Telehealth (INDEPENDENT_AMBULATORY_CARE_PROVIDER_SITE_OTHER): Payer: Self-pay | Admitting: Internal Medicine

## 2009-01-29 ENCOUNTER — Telehealth: Payer: Self-pay | Admitting: Internal Medicine

## 2009-01-30 ENCOUNTER — Ambulatory Visit: Payer: Self-pay | Admitting: Cardiology

## 2009-02-16 ENCOUNTER — Encounter: Admission: RE | Admit: 2009-02-16 | Discharge: 2009-02-16 | Payer: Self-pay | Admitting: Gastroenterology

## 2009-03-21 ENCOUNTER — Ambulatory Visit: Payer: Self-pay | Admitting: Infectious Disease

## 2009-03-21 ENCOUNTER — Encounter: Payer: Self-pay | Admitting: Internal Medicine

## 2009-03-21 ENCOUNTER — Ambulatory Visit (HOSPITAL_COMMUNITY): Admission: RE | Admit: 2009-03-21 | Discharge: 2009-03-21 | Payer: Self-pay | Admitting: Infectious Disease

## 2009-03-21 DIAGNOSIS — J069 Acute upper respiratory infection, unspecified: Secondary | ICD-10-CM | POA: Insufficient documentation

## 2009-03-21 LAB — CONVERTED CEMR LAB
Alkaline Phosphatase: 69 units/L (ref 39–117)
BUN: 8 mg/dL (ref 6–23)
Basophils Absolute: 0 10*3/uL (ref 0.0–0.1)
Eosinophils Absolute: 0.1 10*3/uL (ref 0.0–0.7)
GFR calc non Af Amer: >60 mL/min (ref >60)
Glucose, Bld: 54 mg/dL — ABNORMAL LOW (ref 70–99)
Lymphocytes Relative: 25 % (ref 12–46)
Lymphs Abs: 1.7 10*3/uL (ref 0.7–4.0)
MCV: 81.7 fL (ref 78.0–100.0)
Microalb Creat Ratio: 4.8 mg/g (ref 0.0–30.0)
Microalb, Ur: 0.57 mg/dL (ref 0.00–1.89)
Neutrophils Relative %: 62 % (ref 43–77)
Platelets: 371 10*3/uL (ref 150–400)
RDW: 15.3 % (ref 11.5–15.5)
Total Bilirubin: 0.2 mg/dL — ABNORMAL LOW (ref 0.3–1.2)
WBC: 6.7 10*3/uL (ref 4.0–10.5)

## 2009-04-04 ENCOUNTER — Emergency Department (HOSPITAL_COMMUNITY): Admission: EM | Admit: 2009-04-04 | Discharge: 2009-04-04 | Payer: Self-pay | Admitting: Emergency Medicine

## 2009-04-24 ENCOUNTER — Ambulatory Visit: Payer: Self-pay | Admitting: *Deleted

## 2009-07-04 ENCOUNTER — Ambulatory Visit (HOSPITAL_COMMUNITY): Admission: RE | Admit: 2009-07-04 | Discharge: 2009-07-04 | Payer: Self-pay | Admitting: Obstetrics

## 2009-07-04 ENCOUNTER — Encounter: Payer: Self-pay | Admitting: Internal Medicine

## 2009-07-18 ENCOUNTER — Encounter: Payer: Self-pay | Admitting: Internal Medicine

## 2009-07-23 ENCOUNTER — Ambulatory Visit (HOSPITAL_COMMUNITY): Admission: RE | Admit: 2009-07-23 | Discharge: 2009-07-23 | Payer: Self-pay | Admitting: Obstetrics

## 2009-08-10 ENCOUNTER — Ambulatory Visit (HOSPITAL_COMMUNITY): Admission: RE | Admit: 2009-08-10 | Discharge: 2009-08-10 | Payer: Self-pay | Admitting: Obstetrics

## 2009-09-05 ENCOUNTER — Ambulatory Visit: Payer: Self-pay | Admitting: Internal Medicine

## 2009-09-05 LAB — CONVERTED CEMR LAB: Hgb A1c MFr Bld: 5.7 %

## 2009-09-06 LAB — CONVERTED CEMR LAB
Basophils Absolute: 0 10*3/uL (ref 0.0–0.1)
Basophils Relative: 0 % (ref 0–1)
Eosinophils Absolute: 0.2 10*3/uL (ref 0.0–0.7)
Eosinophils Relative: 2 % (ref 0–5)
Hemoglobin: 11.5 g/dL — ABNORMAL LOW (ref 12.0–15.0)
MCHC: 31 g/dL (ref 30.0–36.0)
MCV: 82.1 fL (ref >=78.0)
Monocytes Absolute: 0.5 10*3/uL (ref 0.1–1.0)
Monocytes Relative: 8 % (ref 3–12)
Neutro Abs: 3.7 10*3/uL (ref 1.7–7.7)
RDW: 15.8 % — ABNORMAL HIGH (ref 11.5–15.5)

## 2009-09-20 ENCOUNTER — Telehealth: Payer: Self-pay | Admitting: *Deleted

## 2009-09-21 ENCOUNTER — Emergency Department (HOSPITAL_COMMUNITY): Admission: EM | Admit: 2009-09-21 | Discharge: 2009-09-21 | Payer: Self-pay | Admitting: Emergency Medicine

## 2009-10-11 ENCOUNTER — Ambulatory Visit: Payer: Self-pay | Admitting: Internal Medicine

## 2009-12-12 ENCOUNTER — Telehealth: Payer: Self-pay | Admitting: Internal Medicine

## 2010-01-27 ENCOUNTER — Emergency Department (HOSPITAL_COMMUNITY): Admission: EM | Admit: 2010-01-27 | Discharge: 2010-01-27 | Payer: Self-pay | Admitting: Emergency Medicine

## 2010-01-28 DIAGNOSIS — E785 Hyperlipidemia, unspecified: Secondary | ICD-10-CM | POA: Insufficient documentation

## 2010-01-29 ENCOUNTER — Telehealth: Payer: Self-pay | Admitting: Internal Medicine

## 2010-01-29 ENCOUNTER — Emergency Department (HOSPITAL_COMMUNITY): Admission: EM | Admit: 2010-01-29 | Discharge: 2010-01-29 | Payer: Self-pay | Admitting: Emergency Medicine

## 2010-02-07 ENCOUNTER — Telehealth: Payer: Self-pay | Admitting: Internal Medicine

## 2010-02-14 ENCOUNTER — Telehealth: Payer: Self-pay | Admitting: Internal Medicine

## 2010-02-27 ENCOUNTER — Ambulatory Visit (HOSPITAL_COMMUNITY): Admission: RE | Admit: 2010-02-27 | Discharge: 2010-02-27 | Payer: Self-pay | Admitting: Infectious Diseases

## 2010-02-27 ENCOUNTER — Ambulatory Visit: Payer: Self-pay | Admitting: Infectious Diseases

## 2010-02-27 DIAGNOSIS — R079 Chest pain, unspecified: Secondary | ICD-10-CM | POA: Insufficient documentation

## 2010-02-27 LAB — CONVERTED CEMR LAB
Albumin: 3.6 g/dL (ref 3.5–5.2)
Alkaline Phosphatase: 72 units/L (ref 39–117)
BUN: 10 mg/dL (ref 6–23)
Basophils Absolute: 0 10*3/uL (ref 0.0–0.1)
Basophils Relative: 0 % (ref 0–1)
CO2: 29 meq/L (ref 19–32)
Eosinophils Relative: 2 % (ref 0–5)
Glucose, Bld: 138 mg/dL — ABNORMAL HIGH (ref 70–99)
HCT: 38 % (ref 36.0–46.0)
Hemoglobin: 12.1 g/dL (ref 12.0–15.0)
MCHC: 31.8 g/dL (ref 30.0–36.0)
MCV: 84.9 fL (ref >=78.0)
Monocytes Absolute: 0.5 10*3/uL (ref 0.1–1.0)
Monocytes Relative: 6 % (ref 3–12)
RBC: 4.47 M/uL (ref 3.87–5.11)
RDW: 15.3 % (ref 11.5–15.5)
Sodium: 140 meq/L (ref 135–145)
Total Bilirubin: 0.3 mg/dL (ref 0.3–1.2)
Total Protein: 6.8 g/dL (ref 6.0–8.3)

## 2010-02-28 ENCOUNTER — Encounter: Payer: Self-pay | Admitting: Internal Medicine

## 2010-03-26 ENCOUNTER — Ambulatory Visit: Payer: Self-pay | Admitting: Cardiology

## 2010-04-17 ENCOUNTER — Ambulatory Visit: Payer: Self-pay | Admitting: Infectious Disease

## 2010-04-17 LAB — CONVERTED CEMR LAB
Blood Glucose, Fingerstick: 71
Hgb A1c MFr Bld: 5.7 %

## 2010-04-25 ENCOUNTER — Encounter: Admission: RE | Admit: 2010-04-25 | Discharge: 2010-04-25 | Payer: Self-pay | Admitting: Neurology

## 2010-06-25 ENCOUNTER — Ambulatory Visit: Payer: Self-pay | Admitting: Internal Medicine

## 2010-06-25 DIAGNOSIS — R109 Unspecified abdominal pain: Secondary | ICD-10-CM

## 2010-06-25 HISTORY — DX: Unspecified abdominal pain: R10.9

## 2010-07-03 ENCOUNTER — Telehealth: Payer: Self-pay | Admitting: Internal Medicine

## 2010-07-05 ENCOUNTER — Ambulatory Visit (HOSPITAL_COMMUNITY): Admission: RE | Admit: 2010-07-05 | Discharge: 2010-07-05 | Payer: Self-pay | Admitting: Obstetrics

## 2010-08-12 ENCOUNTER — Ambulatory Visit (HOSPITAL_COMMUNITY): Admission: RE | Admit: 2010-08-12 | Discharge: 2010-08-12 | Payer: Self-pay | Admitting: Obstetrics

## 2010-08-14 ENCOUNTER — Encounter: Payer: Self-pay | Admitting: Internal Medicine

## 2010-08-27 ENCOUNTER — Encounter: Admission: RE | Admit: 2010-08-27 | Discharge: 2010-08-27 | Payer: Self-pay | Admitting: Obstetrics

## 2010-08-31 ENCOUNTER — Encounter: Admission: RE | Admit: 2010-08-31 | Discharge: 2010-08-31 | Payer: Self-pay | Admitting: Obstetrics

## 2010-09-17 ENCOUNTER — Encounter: Admission: RE | Admit: 2010-09-17 | Discharge: 2010-09-17 | Payer: Self-pay | Admitting: Diagnostic Radiology

## 2010-09-26 ENCOUNTER — Ambulatory Visit: Payer: Self-pay | Admitting: Internal Medicine

## 2010-09-26 LAB — CONVERTED CEMR LAB
AST: 13 units/L (ref 0–37)
Alkaline Phosphatase: 66 units/L (ref 39–117)
BUN: 14 mg/dL (ref 6–23)
Glucose, Bld: 194 mg/dL — ABNORMAL HIGH (ref 70–99)
HDL: 35 mg/dL — ABNORMAL LOW (ref >39)
Hgb A1c MFr Bld: 7.3 %
LDL Cholesterol: 83 mg/dL (ref 0–99)
Potassium: 4.2 meq/L (ref 3.5–5.3)
Sodium: 140 meq/L (ref 135–145)
Total Bilirubin: 0.3 mg/dL (ref 0.3–1.2)
Triglycerides: 63 mg/dL (ref <150)
VLDL: 13 mg/dL (ref 0–40)

## 2010-09-26 LAB — HM DIABETES FOOT EXAM

## 2010-09-27 ENCOUNTER — Telehealth: Payer: Self-pay | Admitting: Internal Medicine

## 2010-09-30 ENCOUNTER — Telehealth: Payer: Self-pay | Admitting: Internal Medicine

## 2010-10-04 ENCOUNTER — Encounter: Payer: Self-pay | Admitting: Internal Medicine

## 2010-10-04 LAB — HM DIABETES EYE EXAM: HM Diabetic Eye Exam: NEGATIVE

## 2010-10-17 ENCOUNTER — Emergency Department (HOSPITAL_COMMUNITY): Admission: EM | Admit: 2010-10-17 | Discharge: 2010-10-18 | Payer: Self-pay | Admitting: Emergency Medicine

## 2010-10-18 ENCOUNTER — Telehealth: Payer: Self-pay | Admitting: Internal Medicine

## 2010-10-21 ENCOUNTER — Ambulatory Visit: Payer: Self-pay | Admitting: Internal Medicine

## 2010-10-21 LAB — CONVERTED CEMR LAB: Blood Glucose, Fingerstick: 174

## 2010-10-29 ENCOUNTER — Encounter: Admission: RE | Admit: 2010-10-29 | Discharge: 2010-10-29 | Payer: Self-pay | Admitting: Internal Medicine

## 2010-10-29 ENCOUNTER — Other Ambulatory Visit
Admission: RE | Admit: 2010-10-29 | Discharge: 2010-10-29 | Payer: Self-pay | Source: Home / Self Care | Admitting: Interventional Radiology

## 2010-10-29 ENCOUNTER — Ambulatory Visit (HOSPITAL_COMMUNITY)
Admission: RE | Admit: 2010-10-29 | Discharge: 2010-10-29 | Payer: Self-pay | Source: Home / Self Care | Admitting: Diagnostic Radiology

## 2010-10-29 ENCOUNTER — Encounter: Payer: Self-pay | Admitting: Internal Medicine

## 2010-10-31 ENCOUNTER — Ambulatory Visit (HOSPITAL_COMMUNITY)
Admission: RE | Admit: 2010-10-31 | Discharge: 2010-11-01 | Payer: Self-pay | Source: Home / Self Care | Admitting: Diagnostic Radiology

## 2010-11-02 ENCOUNTER — Emergency Department (HOSPITAL_COMMUNITY)
Admission: EM | Admit: 2010-11-02 | Discharge: 2010-11-03 | Payer: Self-pay | Source: Home / Self Care | Admitting: Emergency Medicine

## 2010-11-06 ENCOUNTER — Ambulatory Visit: Payer: Self-pay | Admitting: Internal Medicine

## 2010-11-06 DIAGNOSIS — D259 Leiomyoma of uterus, unspecified: Secondary | ICD-10-CM | POA: Insufficient documentation

## 2010-11-06 LAB — CONVERTED CEMR LAB: Blood Glucose, Fingerstick: 161

## 2010-11-18 ENCOUNTER — Encounter
Admission: RE | Admit: 2010-11-18 | Discharge: 2010-11-18 | Payer: Self-pay | Source: Home / Self Care | Attending: Diagnostic Radiology | Admitting: Diagnostic Radiology

## 2010-11-19 ENCOUNTER — Ambulatory Visit: Payer: Self-pay

## 2010-11-19 ENCOUNTER — Encounter: Payer: Self-pay | Admitting: Cardiology

## 2010-12-01 DIAGNOSIS — E890 Postprocedural hypothyroidism: Secondary | ICD-10-CM

## 2010-12-01 DIAGNOSIS — Z85850 Personal history of malignant neoplasm of thyroid: Secondary | ICD-10-CM

## 2010-12-01 HISTORY — DX: Postprocedural hypothyroidism: E89.0

## 2010-12-01 HISTORY — DX: Personal history of malignant neoplasm of thyroid: Z85.850

## 2010-12-05 ENCOUNTER — Ambulatory Visit: Admission: RE | Admit: 2010-12-05 | Discharge: 2010-12-05 | Payer: Self-pay | Source: Home / Self Care

## 2010-12-05 LAB — CBC
HCT: 38.9 % (ref 36.0–46.0)
Hemoglobin: 12.1 g/dL (ref 12.0–15.0)
MCH: 26.8 pg (ref 26.0–34.0)
MCHC: 31.1 g/dL (ref 30.0–36.0)
MCV: 86.3 fL (ref 78.0–100.0)
Platelets: 268 10*3/uL (ref 150–400)
RBC: 4.51 MIL/uL (ref 3.87–5.11)
RDW: 13.6 % (ref 11.5–15.5)
WBC: 4.3 10*3/uL (ref 4.0–10.5)

## 2010-12-05 LAB — URINALYSIS, ROUTINE W REFLEX MICROSCOPIC
Bilirubin Urine: NEGATIVE
Hemoglobin, Urine: NEGATIVE
Ketones, ur: NEGATIVE mg/dL
Nitrite: NEGATIVE
Protein, ur: NEGATIVE mg/dL
Specific Gravity, Urine: 1.022 (ref 1.005–1.030)
Urine Glucose, Fasting: 100 mg/dL — AB
Urobilinogen, UA: 0.2 mg/dL (ref 0.0–1.0)
pH: 6 (ref 5.0–8.0)

## 2010-12-05 LAB — DIFFERENTIAL
Basophils Absolute: 0 10*3/uL (ref 0.0–0.1)
Basophils Relative: 0 % (ref 0–1)
Eosinophils Absolute: 0.1 10*3/uL (ref 0.0–0.7)
Eosinophils Relative: 3 % (ref 0–5)
Lymphocytes Relative: 33 % (ref 12–46)
Lymphs Abs: 1.4 10*3/uL (ref 0.7–4.0)
Monocytes Absolute: 0.3 10*3/uL (ref 0.1–1.0)
Monocytes Relative: 7 % (ref 3–12)
Neutro Abs: 2.4 10*3/uL (ref 1.7–7.7)
Neutrophils Relative %: 57 % (ref 43–77)

## 2010-12-05 LAB — COMPREHENSIVE METABOLIC PANEL
ALT: 17 U/L (ref 0–35)
AST: 17 U/L (ref 0–37)
Albumin: 3.9 g/dL (ref 3.5–5.2)
Alkaline Phosphatase: 69 U/L (ref 39–117)
BUN: 10 mg/dL (ref 6–23)
CO2: 29 mEq/L (ref 19–32)
Calcium: 9.5 mg/dL (ref 8.4–10.5)
Chloride: 102 mEq/L (ref 96–112)
Creatinine, Ser: 0.77 mg/dL (ref 0.4–1.2)
GFR calc Af Amer: >60 mL/min (ref >60)
GFR calc non Af Amer: >60 mL/min (ref >60)
Glucose, Bld: 270 mg/dL — ABNORMAL HIGH (ref 70–99)
Potassium: 4.6 mEq/L (ref 3.5–5.1)
Sodium: 138 mEq/L (ref 135–145)
Total Bilirubin: 0.3 mg/dL (ref 0.3–1.2)
Total Protein: 6.8 g/dL (ref 6.0–8.3)

## 2010-12-05 LAB — TSH: TSH: 0.398 u[IU]/mL (ref 0.350–4.500)

## 2010-12-05 LAB — T4: T4, Total: 10.9 ug/dL (ref 5.0–12.5)

## 2010-12-05 LAB — GLUCOSE, CAPILLARY: Glucose-Capillary: 264 mg/dL — ABNORMAL HIGH (ref 70–99)

## 2010-12-05 LAB — CONVERTED CEMR LAB: Blood Glucose, Fingerstick: 264

## 2010-12-09 ENCOUNTER — Encounter (INDEPENDENT_AMBULATORY_CARE_PROVIDER_SITE_OTHER): Payer: Self-pay | Admitting: General Surgery

## 2010-12-09 ENCOUNTER — Ambulatory Visit (HOSPITAL_COMMUNITY)
Admission: RE | Admit: 2010-12-09 | Discharge: 2010-12-10 | Payer: Self-pay | Source: Home / Self Care | Attending: General Surgery | Admitting: General Surgery

## 2010-12-09 HISTORY — PX: TOTAL THYROIDECTOMY: SHX2547

## 2010-12-16 LAB — GLUCOSE, CAPILLARY
Glucose-Capillary: 120 mg/dL — ABNORMAL HIGH (ref 70–99)
Glucose-Capillary: 178 mg/dL — ABNORMAL HIGH (ref 70–99)
Glucose-Capillary: 203 mg/dL — ABNORMAL HIGH (ref 70–99)
Glucose-Capillary: 204 mg/dL — ABNORMAL HIGH (ref 70–99)
Glucose-Capillary: 247 mg/dL — ABNORMAL HIGH (ref 70–99)
Glucose-Capillary: 273 mg/dL — ABNORMAL HIGH (ref 70–99)
Glucose-Capillary: 94 mg/dL (ref 70–99)

## 2010-12-16 LAB — GLUCOSE, POCT (MANUAL RESULT ENTRY)
Glucose, Bld: 120 mg/dL — ABNORMAL HIGH (ref 70–99)
Operator id: 114401

## 2010-12-16 LAB — CALCIUM
Calcium: 8.3 mg/dL — ABNORMAL LOW (ref 8.4–10.5)
Calcium: 8.9 mg/dL (ref 8.4–10.5)

## 2010-12-19 ENCOUNTER — Ambulatory Visit: Admit: 2010-12-19 | Discharge: 2010-12-19 | Payer: Self-pay

## 2010-12-23 ENCOUNTER — Encounter: Payer: Self-pay | Admitting: Obstetrics

## 2010-12-26 ENCOUNTER — Encounter: Payer: Self-pay | Admitting: Cardiology

## 2010-12-26 LAB — SURGICAL PCR SCREEN
MRSA, PCR: NEGATIVE
Staphylococcus aureus: NEGATIVE

## 2010-12-31 NOTE — Assessment & Plan Note (Signed)
Summary: per dr Gwenlyn Perking, see note/pcp-vega/hla   Vital Signs:  Patient profile:   45 year old female Height:      67 inches (170.18 cm) Weight:      204.6 pounds (93.00 kg) BMI:     32.16 Temp:     97.5 degrees F Pulse rate:   92 / minute BP sitting:   130 / 88  (left arm) Cuff size:   large  Vitals Entered By: Dorie Rank RN (October 21, 2010 1:22 PM) CC: throat sore at times, "glands swollen"- did not take meds or eat yet today Is Patient Diabetic? Yes Did you bring your meter with you today? No Pain Assessment Patient in pain? yes     Location: neck and upper left arm Intensity: 4 Type: aching Onset of pain  couple of monthsfor arm, throat at least 2-3 months Nutritional Status BMI of > 30 = obese CBG Result 174  Have you ever been in a relationship where you felt threatened, hurt or afraid?No   Does patient need assistance? Functional Status Self care Ambulation Normal   Primary Care Provider:  Laren Everts MD  CC:  throat sore at times and "glands swollen"- did not take meds or eat yet today.  History of Present Illness: Pt is a 45 yo AAF with PMH of DM, HTN and HLD who came here for neck mass. She noticed the left side thyroid mass for 3 months and found it is gradually enlarged, associated with mild swallowing difficulty. Denies sweating, palpiatation, diarrhea, weight loss or eye change. No fever, abdominal pain or dysuria. She went to ED 4 days ago and CT of neck showed a 2.9 x 3.2 x 4.6 cm  mass in left thyroid. Sh ehas been taking her meds as instructed and CBG runs 120s.   Preventive Screening-Counseling & Management  Alcohol-Tobacco     Smoking Status: never  Caffeine-Diet-Exercise     Does Patient Exercise: yes     Type of exercise: walking     Times/week: 3  Problems Prior to Update: 1)  Upper Respiratory Infection  (ICD-465.9) 2)  Abdominal Pain, Chronic  (ICD-789.00) 3)  Chest Pain Unspecified  (ICD-786.50) 4)  Hyperlipidemia   (ICD-272.4) 5)  Hypercholesterolemia  (ICD-272.0) 6)  Diabetes Mellitus, Type II  (ICD-250.00) 7)  Hypertension  (ICD-401.9) 8)  Sarcoidosis  (ICD-135) 9)  Fatigue, Chronic  (ICD-780.79) 10)  Obstructive Sleep Apnea  (ICD-327.23) 11)  Gerd  (ICD-530.81) 12)  Hip Pain, Right, Chronic  (ICD-719.45) 13)  Constipation Nos  (ICD-564.00) 14)  Nephrolithiasis, Hx of  (ICD-V13.01) 15)  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 Take 1 Tablet By Mouth Once A Day 2)  Metformin Hcl 500 Mg Tabs (Metformin Hcl) .... Take 1 Tablet By Mouth Two Times A Day 3)  Lantus 100 Unit/ml  Soln (Insulin Glargine) .... Take 35 Untis Morning 4)  Ferrous Sulfate 325 (65 Fe) Mg  Tbec (Ferrous Sulfate) .... Take 1 Tablet By Mouth Three Times A Day 5)  Methotrexate 2.5 Mg  Tabs (Methotrexate Sodium) .... 4 Tablets Every Thursday 6)  Furosemide 40 Mg  Tabs (Furosemide) .... Take 1 Tablet By Mouth Once Daily 7)  Folic Acid 1 Mg  Tabs (Folic Acid) .... Take  1 Tablet By Mouth Once Daily 8)  Amlodipine Besylate 5 Mg  Tabs (Amlodipine Besylate) .... Take 2 Tablets By Mouth Once Daily 9)  Adult Aspirin Ec Low Strength 81 Mg  Tbec (Aspirin) .... Take 1 Tablet By Mouth Once A Day 10)  Nexium 40 Mg Cpdr (Esomeprazole Magnesium) .... One Tablet By Mouth Daily 11)  Ambien 5 Mg Tabs (Zolpidem Tartrate) .... One Tablet By Mouth At Night Before Bed 12)  Advair Diskus 100-50 Mcg/dose Aepb (Fluticasone-Salmeterol) .... 2 Puffs Twice Daily. 13)  Mobic 7.5 Mg Tabs (Meloxicam) .... Take 1-2  Tablets By Mouth Once A Day 14)  Calcium 500/d 500-400 Mg-Unit  Chew (Calcium-Vitamin D) .... Take 1 Tablet By Mouth Two Times A Day 15)  Prodigy Autocode Blood Glucose  Devi (Blood Glucose  Monitoring Suppl) .... One Device 16)  Prodigy Twist Top Lancets 28g  Misc (Lancets) .... Use To Check Blood Sugar  Times A Day 17)  Prodigy Insulin Syringe 28g X 1/2" 1 Ml Misc (Insulin Syringe-Needle U-100) .... Use To Inject Insulin Once Daily 18)  Prodigy Autocode Blood Glucose  Strp (Glucose Blood) .... Use To Check Blood Sugar 3x Daily 19)  Prodigy Autocode Blood Glucose  Devi (Blood Glucose Monitoring Suppl) .... Use To Check Blood Sugar 3x A Day 20)  Prodigy Pocket Blood Glucose W/device Kit (Blood Glucose Monitoring Suppl) .... Test Once A Day  Current Medications (verified): 1)  Lipitor 20 Mg Tabs (Atorvastatin Calcium) .... Take 1 Tablet By Mouth Once A Day 2)  Metformin Hcl 500 Mg Tabs (Metformin Hcl) .... Take 1 Tablet By Mouth Two Times A Day 3)  Lantus 100 Unit/ml  Soln (Insulin Glargine) .... Take 35 Untis Morning 4)  Ferrous Sulfate 325 (65 Fe) Mg  Tbec (Ferrous Sulfate) .... Take 1 Tablet By Mouth Three Times A Day 5)  Methotrexate 2.5 Mg  Tabs (Methotrexate Sodium) .... 4 Tablets Every Thursday 6)  Furosemide 40 Mg  Tabs (Furosemide) .... Take 1 Tablet By Mouth Once Daily 7)  Folic Acid 1 Mg  Tabs (Folic Acid) .... Take 1 Tablet By Mouth Once Daily 8)  Amlodipine Besylate 5 Mg  Tabs (Amlodipine Besylate) .... Take 2 Tablets By Mouth Once Daily 9)  Adult Aspirin Ec Low Strength 81 Mg  Tbec (Aspirin) .... Take 1 Tablet By Mouth Once A Day 10)  Nexium 40 Mg Cpdr (Esomeprazole Magnesium) .... One Tablet By Mouth Daily 11)  Ambien 5 Mg Tabs (Zolpidem Tartrate) .... One Tablet By Mouth At Night Before Bed 12)  Advair Diskus 100-50 Mcg/dose Aepb (Fluticasone-Salmeterol) .... 2 Puffs Twice Daily. 13)  Mobic 7.5 Mg Tabs (Meloxicam) .... Take 1-2  Tablets By Mouth Once A Day 14)  Calcium 500/d 500-400 Mg-Unit  Chew (Calcium-Vitamin D) .... Take 1 Tablet By Mouth Two Times A Day 15)  Prodigy Autocode Blood Glucose  Devi (Blood Glucose Monitoring Suppl) .... One Device 16)  Prodigy  Twist Top Lancets 28g  Misc (Lancets) .... Use To Check Blood Sugar  Times A Day 17)  Prodigy Insulin Syringe 28g X 1/2" 1 Ml Misc (Insulin Syringe-Needle U-100) .... Use To Inject Insulin Once Daily 18)  Prodigy Autocode Blood Glucose  Strp (Glucose Blood) .... Use To Check Blood Sugar 3x Daily 19)  Prodigy Autocode Blood Glucose  Devi (Blood Glucose Monitoring Suppl) .... Use To Check Blood Sugar 3x A Day 20)  Prodigy Pocket Blood Glucose W/device Kit (Blood Glucose Monitoring Suppl) .... Test Once A Day  Allergies: No Known  Drug Allergies  Past History:  Past Medical History: Last updated: 01/28/2010 Sarcoidosis with brain involvement Obstructive sleep apnea Prolonged QT Osteoporosis Iron deficiency anemia gastroesophageal reflux disease Diabetes mellitus type II Peripheral neuropathy Hypertension Hx of nephrolithiasis Chronic fatigue  kidney  stone  Past Surgical History: Last updated: 01/28/2010  Hysteroscopy, dilation and curettage, Novasure  ablation.    Cystoscopy, ureteroscopy with stone extraction, left retrograde pyelogram.  Family History: Last updated: 01/28/2010   Questionable coronary disease; arrhythmia in her   mother.   Social History: Last updated: 06/25/2010  Lives in [place] with 2 sons and her husband;   disabled; no use of tobacco products nor alcohol.   Educaion- Highschool  Risk Factors: Smoking Status: never (10/21/2010)  Family History: Reviewed history from 01/28/2010 and no changes required.   Questionable coronary disease; arrhythmia in her   mother.   Social History: Reviewed history from 06/25/2010 and no changes required.  Lives in [place] with 2 sons and her husband;   disabled; no use of tobacco products nor alcohol.   Educaion- Highschool  Review of Systems  The patient denies fever, decreased hearing, hoarseness, chest pain, syncope, dyspnea on exertion, peripheral edema, prolonged cough, headaches, hemoptysis,  abdominal pain, melena, and hematochezia.    Physical Exam  General:  alert, well-developed, well-nourished, and well-hydrated.   Eyes:  vision grossly intact, pupils equal, pupils round, pupils reactive to light, and no nystagmus.   Nose:  no nasal discharge.   Mouth:  pharynx pink and moist.   Neck:  supple and L neck mass, no tenderness or bruits.   Lungs:  normal respiratory effort, no intercostal retractions, no crackles, and no wheezes.   Heart:  normal rate, regular rhythm, no murmur, and no JVD.   Abdomen:  soft, non-tender, and normal bowel sounds.   Msk:  normal ROM, no joint tenderness, no joint swelling, and no joint warmth.   Pulses:  2+ Extremities:  No edema.  Neurologic:  alert & oriented X3.     Impression & Recommendations:  Problem # 1:  THYROID MASS (ICD-240.9) Assessment New She has newly found left thyroid nodule, w/o symptoms. Will check TSH and free T4. If TSH low, will have radioactive uptaling and scanning study for hyperhyroidism or functional ademoma; If TSH WNL, will do thyroid biopsy under US guidance to r/o any malignancy or non-functional adenoma.   Orders: T-TSH [phone]) T-T4, Free [phone])  Problem # 2:  DIABETES MELLITUS, TYPE II (ICD-250.00) Assessment: Unchanged Her CBG now seems well controlled with CBg runs 120s and will continue these meds.  Her updated medication list for this problem includes:    Metformin Hcl 500 Mg Tabs (Metformin hcl) .Marland Kitchen... Take 1 tablet by mouth two times a day    Lantus 100 Unit/ml Soln (Insulin glargine) .Marland Kitchen... Take 35 untis morning    Adult Aspirin Ec Low Strength 81 Mg Tbec (Aspirin) .Marland Kitchen... Take 1 tablet by mouth once a day  Orders: T-Urine Microalbumin w/creat. ratio [phone]) Capillary Blood Glucose/CBG [phone])  Labs Reviewed: Creat: 0.69 (09/26/2010)     Last Eye Exam: Neg for diabetic retinopathy (10/04/2010) Reviewed HgBA1c results: 7.3 (09/26/2010)  5.7 (04/17/2010)  Problem # 3:   HYPERTENSION (ICD-401.9) Assessment: Improved BP well controlled and will continue current meds.  Her updated medication list for this problem includes:    Furosemide 40 Mg Tabs (Furosemide) .Marland Kitchen... Take 1 tablet by mouth once daily    Amlodipine Besylate 5 Mg Tabs (Amlodipine besylate) .Marland Kitchen... Take 2 tablets by  mouth once daily  BP today: 130/88 Prior BP: 145/86 (09/26/2010)  Labs Reviewed: K+: 4.2 (09/26/2010) Creat: : 0.69 (09/26/2010)   Chol: 131 (09/26/2010)   HDL: 35 (09/26/2010)   LDL: 83 (09/26/2010)   TG: 63 (09/26/2010)  Complete Medication List: 1)  Lipitor 20 Mg Tabs (Atorvastatin calcium) .... Take 1 tablet by mouth once a day 2)  Metformin Hcl 500 Mg Tabs (Metformin hcl) .... Take 1 tablet by mouth two times a day 3)  Lantus 100 Unit/ml Soln (Insulin glargine) .... Take 35 untis morning 4)  Ferrous Sulfate 325 (65 Fe) Mg Tbec (Ferrous sulfate) .... Take 1 tablet by mouth three times a day 5)  Methotrexate 2.5 Mg Tabs (Methotrexate sodium) .... 4 tablets every thursday 6)  Furosemide 40 Mg Tabs (Furosemide) .... Take 1 tablet by mouth once daily 7)  Folic Acid 1 Mg Tabs (Folic acid) .... Take 1 tablet by mouth once daily 8)  Amlodipine Besylate 5 Mg Tabs (Amlodipine besylate) .... Take 2 tablets by mouth once daily 9)  Adult Aspirin Ec Low Strength 81 Mg Tbec (Aspirin) .... Take 1 tablet by mouth once a day 10)  Nexium 40 Mg Cpdr (Esomeprazole magnesium) .... One tablet by mouth daily 11)  Ambien 5 Mg Tabs (Zolpidem tartrate) .... One tablet by mouth at night before bed 12)  Advair Diskus 100-50 Mcg/dose Aepb (Fluticasone-salmeterol) .... 2 puffs twice daily. 13)  Mobic 7.5 Mg Tabs (Meloxicam) .... Take 1-2  tablets by mouth once a day 14)  Calcium 500/d 500-400 Mg-unit Chew (Calcium-vitamin d) .... Take 1 tablet by mouth two times a day 15)  Prodigy Autocode Blood Glucose Devi (Blood glucose monitoring suppl) .... One device 16)  Prodigy Twist Top Lancets 28g Misc (Lancets)  .... Use to check blood sugar  times a day 17)  Prodigy Insulin Syringe 28g X 1/2" 1 Ml Misc (Insulin syringe-needle u-100) .... Use to inject insulin once daily 18)  Prodigy Autocode Blood Glucose Strp (Glucose blood) .... Use to check blood sugar 3x daily 19)  Prodigy Autocode Blood Glucose Devi (Blood glucose monitoring suppl) .... Use to check blood sugar 3x a day 20)  Prodigy Pocket Blood Glucose W/device Kit (Blood glucose monitoring suppl) .... Test once a day  Patient Instructions: 1)  Please schedule a follow-up appointment in 1-2 weeks. 2)  We will call you if any abnormal labs. 3)  Check your blood sugars regularly. If your readings are usually above : or below 70 you should contact our office. 4)  See your eye doctor yearly to check for diabetic eye damage. 5)  Check your feet each night for sore areas, calluses or signs of infection.   Orders Added: 1)  T-Urine Microalbumin w/creat. ratio [[id number]] 2)  Capillary Blood Glucose/CBG [82948] 3)  T-TSH [[id number]] 4)  T-T4, Free [[id number]] 5)  Est. Patient Level IV [42595]   Process Orders Check Orders Results:     [facility]: ABN not required for this insurance Tests Sent for requisitioning (October 21, 2010 9:14 PM):     10/21/2010: [facility] -- T-Urine Microalbumin w/creat. ratio [[id number]] (signed)     10/21/2010: [facility] -- T-TSH [phone] (signed)     10/21/2010: [facility] -- T-T4, [place] [[id number]] (signed)     Prevention & Chronic Care Immunizations   Influenza vaccine: Fluvax Non-MCR  (09/26/2010)    Tetanus booster: Not documented   Td booster deferral: Deferred  (02/27/2010)    Pneumococcal vaccine: Not documented  Other Screening   Pap smear: Not documented   Pap smear action/deferral: Deferred-2 yr interval  (09/05/2009)    Mammogram: Not documented   Mammogram action/deferral: Deferred-2 yr interval   (09/05/2009)   Smoking status: never  (10/21/2010)  Diabetes Mellitus   HgbA1C: 7.3  (09/26/2010)    Eye exam: Neg for diabetic retinopathy  (10/04/2010)   Diabetic eye exam action/deferral: Ophthalmology referral  (09/26/2010)    Foot exam: yes  (09/26/2010)   High risk foot: No  (09/26/2010)   Foot care education: Done  (09/26/2010)    Urine microalbumin/creatinine ratio: 4.8  (03/21/2009)   Urine microalbumin action/deferral: Ordered    Diabetes flowsheet reviewed?: Yes   Progress toward A1C goal: Deteriorated  Lipids   Total Cholesterol: 131  (09/26/2010)   Lipid panel action/deferral: Lipid Panel ordered   LDL: 83  (09/26/2010)   LDL Direct: Not documented   HDL: 35  (09/26/2010)   Triglycerides: 63  (09/26/2010)    SGOT (AST): 13  (09/26/2010)   SGPT (ALT): 15  (09/26/2010)   Alkaline phosphatase: 66  (09/26/2010)   Total bilirubin: 0.3  (09/26/2010)    Lipid flowsheet reviewed?: Yes   Progress toward LDL goal: Unchanged  Hypertension   Last Blood Pressure: 130 / 88  (10/21/2010)   Serum creatinine: 0.69  (09/26/2010)   Serum potassium 4.2  (09/26/2010)    Hypertension flowsheet reviewed?: Yes   Progress toward BP goal: Improved  Self-Management Support :   Personal Goals (by the next clinic visit) :     Personal A1C goal: 6  (10/11/2009)     Personal blood pressure goal: 130/80  (10/11/2009)     Personal LDL goal: 100  (10/11/2009)    Patient will work on the following items until the next clinic visit to reach self-care goals:     Medications and monitoring: take my medicines every day, check my blood sugar, bring all of my medications to every visit, examine my feet every day  (10/21/2010)     Eating: drink diet soda or water instead of juice or soda, eat more vegetables, use fresh or frozen vegetables, eat baked foods instead of fried foods, limit or avoid alcohol  (10/21/2010)     Activity: take a 30 minute walk every day  (10/21/2010)     Other:  walks 2 miles about 3-4 times a week  (10/21/2010)    Diabetes self-management support: Pre-printed educational material, Written self-care plan  (10/21/2010)   Diabetes care plan printed    Hypertension self-management support: Written self-care plan  (10/21/2010)   Hypertension self-care plan printed.    Lipid self-management support: Written self-care plan  (10/21/2010)   Lipid self-care plan printed.

## 2010-12-31 NOTE — Progress Notes (Signed)
Summary: Refill/gh  Phone Note Refill Request Message from:  Fax from Pharmacy  Refills Requested: Medication #1:  METFORMIN HCL 500 MG TABS Take 1 tablet by mouth two times a day   Last Refilled: 01/10/2010  Method Requested: Electronic Initial call taken by: Angelina Ok RN,  September 30, 2010 11:46 AM    Prescriptions: METFORMIN HCL 500 MG TABS (METFORMIN HCL) Take 1 tablet by mouth two times a day  #60 x 3   Entered and Authorized by:   Laren Everts MD   Signed by:   Laren Everts MD on 09/30/2010   Method used:   Electronically to        [facility] [address] [phone]* (retail)       [address]       [place], [place]  [address]       Ph: [phone] or [phone]       Fax: [phone]   RxID:   [id number]

## 2010-12-31 NOTE — Assessment & Plan Note (Signed)
Summary: BIOPSY RESULTS/VEGA/BUT Braedon Sjogren ORDERED AND WANTED TO FOLLOWUP/DS   Vital Signs:  Patient profile:   45 year old female Height:      67 inches (170.18 cm) Weight:      201.06 pounds (91.39 kg) BMI:     31.60 Temp:     98.2 degrees F (36.78 degrees C) oral Pulse rate:   78 / minute BP sitting:   158 / 95  (left arm)  Vitals Entered By: Angelina Ok RN (November 06, 2010 2:52 PM) CC: Depression Is Patient Diabetic? Yes Did you bring your meter with you today? No Pain Assessment Patient in pain? yes     Location: right side CBG Result 161  Have you ever been in a relationship where you felt threatened, hurt or afraid?No   Does patient need assistance? Functional Status Self care Ambulation Normal Comments Constipation.  Had a small BM last week before surgery.  Appetite is decreased.  Had a Uterine fibroid embolization.  Has had vomiting is on something for.  Needs refill on pain med.  Needs results of studies   Primary Care Provider:  Laren Everts MD  CC:  Depression.  History of Present Illness: Pt is a 45 yo AAF with PMH of left thyroid mass s/p biopsy, fibroid s/p uterine embolization, DM, HTN and HLD who came here for check thyroid nodule biopsy results. She noticed the left side thyroid mass for 3 months and found it is gradually enlarged, associated with mild swallowing difficulty. 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 She has run out norvasc for several days and wants to refill. Her CBG has runs about 120s.   Depression History:      The patient denies a depressed mood most of the day and a diminished interest in her usual daily activities.         Preventive Screening-Counseling & Management  Alcohol-Tobacco     Smoking Status: never  Problems Prior to Update: 1)  Thyroid  Mass  (ICD-240.9) 2)  Upper Respiratory Infection  (ICD-465.9) 3)  Abdominal Pain, Chronic  (ICD-789.00) 4)  Chest Pain Unspecified  (ICD-786.50) 5)  Hyperlipidemia  (ICD-272.4) 6)  Hypercholesterolemia  (ICD-272.0) 7)  Diabetes Mellitus, Type II  (ICD-250.00) 8)  Hypertension  (ICD-401.9) 9)  Sarcoidosis  (ICD-135) 10)  Fatigue, Chronic  (ICD-780.79) 11)  Obstructive Sleep Apnea  (ICD-327.23) 12)  Gerd  (ICD-530.81) 13)  Hip Pain, Right, Chronic  (ICD-719.45) 14)  Constipation Nos  (ICD-564.00) 15)  Nephrolithiasis, Hx of  (ICD-V13.01) 16)  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 Take 1 Tablet By Mouth Once A Day 2)  Metformin Hcl 500 Mg Tabs (Metformin Hcl) .... Take 1 Tablet By Mouth Two Times A Day 3)  Lantus 100 Unit/ml  Soln (Insulin Glargine) .... Take 35 Untis Morning 4)  Ferrous Sulfate 325 (65 Fe) Mg  Tbec (Ferrous Sulfate) .... Take 1 Tablet By Mouth Three Times A Day 5)  Methotrexate 2.5 Mg  Tabs (Methotrexate Sodium) .... 4 Tablets Every Thursday 6)  Furosemide 40 Mg  Tabs (Furosemide) .Marland KitchenMarland KitchenMarland Kitchen  Take 1 Tablet By Mouth Once Daily 7)  Folic Acid 1 Mg  Tabs (Folic Acid) .... Take 1 Tablet By Mouth Once Daily 8)  Amlodipine Besylate 5 Mg  Tabs (Amlodipine Besylate) .... Take 2 Tablets By Mouth Once Daily 9)  Adult Aspirin Ec Low Strength 81 Mg  Tbec (Aspirin) .... Take 1 Tablet By Mouth Once A Day 10)  Nexium 40 Mg Cpdr (Esomeprazole Magnesium) .... One Tablet By Mouth Daily 11)  Ambien 5 Mg Tabs (Zolpidem Tartrate) .... One Tablet By Mouth At Night Before Bed 12)  Advair Diskus 100-50 Mcg/dose Aepb (Fluticasone-Salmeterol) .... 2 Puffs Twice Daily. 13)  Mobic 7.5 Mg Tabs (Meloxicam) .... Take 1-2  Tablets By Mouth Once A  Day 14)  Calcium 500/d 500-400 Mg-Unit  Chew (Calcium-Vitamin D) .... Take 1 Tablet By Mouth Two Times A Day 15)  Prodigy Autocode Blood Glucose  Devi (Blood Glucose Monitoring Suppl) .... One Device 16)  Prodigy Twist Top Lancets 28g  Misc (Lancets) .... Use To Check Blood Sugar  Times A Day 17)  Prodigy Insulin Syringe 28g X 1/2" 1 Ml Misc (Insulin Syringe-Needle U-100) .... Use To Inject Insulin Once Daily 18)  Prodigy Autocode Blood Glucose  Strp (Glucose Blood) .... Use To Check Blood Sugar 3x Daily 19)  Prodigy Autocode Blood Glucose  Devi (Blood Glucose Monitoring Suppl) .... Use To Check Blood Sugar 3x A Day 20)  Prodigy Pocket Blood Glucose W/device Kit (Blood Glucose Monitoring Suppl) .... Test Once A Day  Current Medications (verified): 1)  Lipitor 20 Mg Tabs (Atorvastatin Calcium) .... Take 1 Tablet By Mouth Once A Day 2)  Metformin Hcl 500 Mg Tabs (Metformin Hcl) .... Take 1 Tablet By Mouth Two Times A Day 3)  Lantus 100 Unit/ml  Soln (Insulin Glargine) .... Take 35 Untis Morning 4)  Ferrous Sulfate 325 (65 Fe) Mg  Tbec (Ferrous Sulfate) .... Take 1 Tablet By Mouth Three Times A Day 5)  Methotrexate 2.5 Mg  Tabs (Methotrexate Sodium) .... 4 Tablets Every Thursday 6)  Furosemide 40 Mg  Tabs (Furosemide) .... Take 1 Tablet By Mouth Once Daily 7)  Folic Acid 1 Mg  Tabs (Folic Acid) .... Take 1 Tablet By Mouth Once Daily 8)  Amlodipine Besylate 5 Mg  Tabs (Amlodipine Besylate) .... Take 2 Tablets By Mouth Once Daily 9)  Adult Aspirin Ec Low Strength 81 Mg  Tbec (Aspirin) .... Take 1 Tablet By Mouth Once A Day 10)  Nexium 40 Mg Cpdr (Esomeprazole Magnesium) .... One Tablet By Mouth Daily 11)  Ambien 5 Mg Tabs (Zolpidem Tartrate) .... One Tablet By Mouth At Night Before Bed 12)  Advair Diskus 100-50 Mcg/dose Aepb (Fluticasone-Salmeterol) .... 2 Puffs Twice Daily. 13)  Mobic 7.5 Mg Tabs (Meloxicam) .... Take 1-2  Tablets By Mouth Once A Day 14)  Calcium 500/d 500-400 Mg-Unit  Chew  (Calcium-Vitamin D) .... Take 1 Tablet By Mouth Two Times A Day 15)  Prodigy Autocode Blood Glucose  Devi (Blood Glucose Monitoring Suppl) .... One Device 16)  Prodigy Twist Top Lancets 28g  Misc (Lancets) .... Use To Check Blood Sugar  Times A Day 17)  Prodigy Insulin Syringe 28g X 1/2" 1 Ml Misc (Insulin Syringe-Needle U-100) .... Use To Inject Insulin Once Daily 18)  Prodigy Autocode Blood Glucose  Strp (Glucose Blood) .... Use To Check Blood Sugar 3x Daily 19)  Prodigy Autocode Blood Glucose  Devi (Blood Glucose Monitoring Suppl) .... Use To Check Blood Sugar 3x A Day 20)  Prodigy Pocket Blood Glucose W/device Kit (Blood Glucose Monitoring Suppl) .... Test Once A Day 21)  Percocet 10-650 Mg Tabs (Oxycodone-Acetaminophen) .... Take 1 Tablet By Mouth Three Times A Day As Needed  Allergies (verified): No Known Drug Allergies  Past History:  Past Medical History: Last updated: 01/28/2010 Sarcoidosis with brain involvement Obstructive sleep apnea Prolonged QT Osteoporosis Iron deficiency anemia gastroesophageal reflux disease Diabetes mellitus type II Peripheral neuropathy Hypertension Hx of nephrolithiasis Chronic fatigue  kidney  stone  Past Surgical History: Last updated: 01/28/2010  Hysteroscopy, dilation and curettage, Novasure  ablation.    Cystoscopy, ureteroscopy with stone extraction, left retrograde pyelogram.  Social History: Last updated: 06/25/2010  Lives in [place] with 2 sons and her husband;   disabled; no use of tobacco products nor alcohol.   Educaion- Highschool  Risk Factors: Smoking Status: never (11/06/2010)  Family History: Reviewed history from 01/28/2010 and no changes required.   Questionable coronary disease; arrhythmia in her   mother.   Social History: Reviewed history from 06/25/2010 and no changes required.  Lives in [place] with 2 sons and her husband;   disabled; no use of tobacco products nor alcohol.   Educaion-  Highschool  Review of Systems       The patient complains of abdominal pain.  The patient denies fever, chest pain, peripheral edema, prolonged cough, headaches, hemoptysis, melena, and hematochezia.    Physical Exam  General:  alert, well-developed, well-nourished, and well-hydrated.   Nose:  no nasal discharge.   Mouth:  pharynx pink and moist.   Neck:  supple and L neck mass, no tenderness or bruits.   Lungs:  normal respiratory effort, no accessory muscle use, normal breath sounds, no crackles, and no wheezes.   Heart:  normal rate, regular rhythm, no murmur, and no JVD.   Abdomen:  soft, normal bowel sounds, no distention, no masses, and RLQ tenderness.   Msk:  normal ROM, no joint tenderness, no joint swelling, and no joint warmth.   Neurologic:  alert & oriented X3 and gait normal.     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 We are concernd with potential malignancy, so will have [facility] referral for management, including surgery vs monitoring.  Orders: Surgical Referral (Surgery)  Problem # 2:  HYPERTENSION (ICD-401.9) Assessment: Deteriorated Her BP worse than before, likely due to running out of her meds. So will refill this for her and recheck BP at next visit.  Her updated medication list for this problem includes:    Furosemide 40 Mg Tabs (Furosemide) .Marland Kitchen... Take 1 tablet by mouth once daily    Amlodipine Besylate 5 Mg Tabs (Amlodipine besylate) .Marland Kitchen... Take 2 tablets by mouth once daily  BP today: 158/95 Prior BP: 130/88 (10/21/2010)  Labs Reviewed: K+: 4.2 (09/26/2010) Creat: : 0.69 (09/26/2010)   Chol: 131 (09/26/2010)   HDL: 35 (09/26/2010)   LDL: 83 (09/26/2010)   TG: 63 (09/26/2010)  Problem # 3:  DIABETES MELLITUS, TYPE II (ICD-250.00) Assessment: Improved CBG well  controlled and Continue current regimen. Will recheck at next visit.  Her updated medication list for this problem includes:    Metformin Hcl 500 Mg Tabs (Metformin hcl) .Marland Kitchen... Take 1 tablet by mouth two times a day    Lantus 100 Unit/ml Soln (Insulin glargine) .Marland Kitchen... Take 35 untis morning  Adult Aspirin Ec Low Strength 81 Mg Tbec (Aspirin) .Marland Kitchen... Take 1 tablet by mouth once a day  Orders: Capillary Blood Glucose/CBG [phone])  Labs Reviewed: Creat: 0.69 (09/26/2010)     Last Eye Exam: Neg for diabetic retinopathy (10/04/2010) Reviewed HgBA1c results: 7.3 (09/26/2010)  5.7 (04/17/2010)  Problem # 4:  FIBROIDS, UTERUS (ICD-218.9) Assessment: Comment Only She had embolization on 10/31/2010 and had abdominal pain, controlled well with percocet. Will give her pain med refill.   Complete Medication List: 1)  Lipitor 20 Mg Tabs (Atorvastatin calcium) .... Take 1 tablet by mouth once a day 2)  Metformin Hcl 500 Mg Tabs (Metformin hcl) .... Take 1 tablet by mouth two times a day 3)  Lantus 100 Unit/ml Soln (Insulin glargine) .... Take 35 untis morning 4)  Ferrous Sulfate 325 (65 Fe) Mg Tbec (Ferrous sulfate) .... Take 1 tablet by mouth three times a day 5)  Methotrexate 2.5 Mg Tabs (Methotrexate sodium) .... 4 tablets every thursday 6)  Furosemide 40 Mg Tabs (Furosemide) .... Take 1 tablet by mouth once daily 7)  Folic Acid 1 Mg Tabs (Folic acid) .... Take 1 tablet by mouth once daily 8)  Amlodipine Besylate 5 Mg Tabs (Amlodipine besylate) .... Take 2 tablets by mouth once daily 9)  Adult Aspirin Ec Low Strength 81 Mg Tbec (Aspirin) .... Take 1 tablet by mouth once a day 10)  Nexium 40 Mg Cpdr (Esomeprazole magnesium) .... One tablet by mouth daily 11)  Ambien 5 Mg Tabs (Zolpidem tartrate) .... One tablet by mouth at night before bed 12)  Advair Diskus 100-50 Mcg/dose Aepb (Fluticasone-salmeterol) .... 2 puffs twice daily. 13)  Mobic 7.5 Mg Tabs (Meloxicam) .... Take 1-2  tablets by mouth once a  day 14)  Calcium 500/d 500-400 Mg-unit Chew (Calcium-vitamin d) .... Take 1 tablet by mouth two times a day 15)  Prodigy Autocode Blood Glucose Devi (Blood glucose monitoring suppl) .... One device 16)  Prodigy Twist Top Lancets 28g Misc (Lancets) .... Use to check blood sugar  times a day 17)  Prodigy Insulin Syringe 28g X 1/2" 1 Ml Misc (Insulin syringe-needle u-100) .... Use to inject insulin once daily 18)  Prodigy Autocode Blood Glucose Strp (Glucose blood) .... Use to check blood sugar 3x daily 19)  Prodigy Autocode Blood Glucose Devi (Blood glucose monitoring suppl) .... Use to check blood sugar 3x a day 20)  Prodigy Pocket Blood Glucose W/device Kit (Blood glucose monitoring suppl) .... Test once a day 21)  Percocet 10-650 Mg Tabs (Oxycodone-acetaminophen) .... Take 1 tablet by mouth three times a day as needed  Patient Instructions: 1)  Please schedule a follow-up appointment in 3 months. 2)  Please follow-up the surgery appointment on 11/19/2010 @9 :40 AM.  Prescriptions: AMLODIPINE BESYLATE 5 MG  TABS (AMLODIPINE BESYLATE) take 2 tablets by mouth once daily  #60 x 3   Entered and Authorized by:   Jackson Latino MD   Signed by:   Jackson Latino MD on 11/06/2010   Method used:   Print then Give to Patient   RxID:   [id number] PERCOCET 10-650 MG TABS (OXYCODONE-ACETAMINOPHEN) Take 1 tablet by mouth three times a day as needed  #30 x 0   Entered and Authorized by:   Jackson Latino MD   Signed by:   Jackson Latino MD on 11/06/2010   Method used:   Print then Give to Patient   RxID:   [id number]    Orders Added: 1)  Capillary Blood Glucose/CBG [24401] 2)  Surgical Referral [Surgery] 3)  Est. Patient Level IV [81191]    Prevention & Chronic Care Immunizations   Influenza vaccine: Fluvax Non-MCR  (09/26/2010)    Tetanus booster: Not documented   Td booster deferral: Deferred  (02/27/2010)    Pneumococcal vaccine: Not documented  Other Screening   Pap  smear: Not documented   Pap smear action/deferral: Deferred-2 yr interval  (09/05/2009)    Mammogram: Not documented   Mammogram action/deferral: Deferred-2 yr interval  (09/05/2009)   Smoking status: never  (11/06/2010)  Diabetes Mellitus   HgbA1C: 7.3  (09/26/2010)    Eye exam: Neg for diabetic retinopathy  (10/04/2010)   Diabetic eye exam action/deferral: Ophthalmology referral  (09/26/2010)    Foot exam: yes  (09/26/2010)   High risk foot: No  (09/26/2010)   Foot care education: Done  (09/26/2010)    Urine microalbumin/creatinine ratio: 4.5  (10/21/2010)   Urine microalbumin action/deferral: Ordered    Diabetes flowsheet reviewed?: Yes   Progress toward A1C goal: Deteriorated  Lipids   Total Cholesterol: 131  (09/26/2010)   Lipid panel action/deferral: Lipid Panel ordered   LDL: 83  (09/26/2010)   LDL Direct: Not documented   HDL: 35  (09/26/2010)   Triglycerides: 63  (09/26/2010)    SGOT (AST): 13  (09/26/2010)   SGPT (ALT): 15  (09/26/2010)   Alkaline phosphatase: 66  (09/26/2010)   Total bilirubin: 0.3  (09/26/2010)    Lipid flowsheet reviewed?: Yes   Progress toward LDL goal: At goal  Hypertension   Last Blood Pressure: 158 / 95  (11/06/2010)   Serum creatinine: 0.69  (09/26/2010)   Serum potassium 4.2  (09/26/2010)    Hypertension flowsheet reviewed?: Yes   Progress toward BP goal: Deteriorated  Self-Management Support :   Personal Goals (by the next clinic visit) :     Personal A1C goal: 6  (10/11/2009)     Personal blood pressure goal: 130/80  (10/11/2009)     Personal LDL goal: 100  (10/11/2009)    Diabetes self-management support: Pre-printed educational material, Written self-care plan  (10/21/2010)    Hypertension self-management support: Written self-care plan  (10/21/2010)    Lipid self-management support: Written self-care plan  (10/21/2010)

## 2010-12-31 NOTE — Assessment & Plan Note (Signed)
Summary: ACUTE-SHOULDER PAIN (VEGA)/CFB   Vital Signs:  Patient profile:   45 year old female Height:      68 inches (172.72 cm) Weight:      237.6 pounds (108.00 kg) BMI:     36.26 Temp:     98.1 degrees F (36.72 degrees C) oral Pulse rate:   97 / minute BP sitting:   155 / 91  (right arm) Cuff size:   large  Vitals Entered By: Theotis Barrio NT II (February 27, 2010 10:13 AM) CC: RIGHT SIDE PAIN FOR  ABOUT A WEEK /RIGHT GROIN PAIN BACK AGAIN / SOB FOR ABOUT  2 DAYS Is Patient Diabetic? Yes Did you bring your meter with you today? No Pain Assessment Patient in pain? yes     Location: RIGHT SIDE Intensity:      4 Type: dull Onset of pain  FOR ABOUT 2 WEEKS  Nutritional Status BMI of > 30 = obese CBG Result 113  Have you ever been in a relationship where you felt threatened, hurt or afraid?No   Does patient need assistance? Functional Status Self care Ambulation Normal   Primary Care Provider:  Laren Everts MD  CC:  RIGHT SIDE PAIN FOR  ABOUT A WEEK /RIGHT GROIN PAIN BACK AGAIN / SOB FOR ABOUT  2 DAYS.  History of Present Illness: [name] [name] is a 45 year old Female with PMH/problems as outlined in the EMR, who presents to the [facility] with chief complaint(s) of:    1. Right chest pain: for the past week or so. Pain on the right side of chest, some difficulty breathing and dry cough; no fever, chills or sick contact. Similar pain last year that went away on its own. No leg  swelling or immobility. NO h/o dvt, pulmonary emboli.   2. Constipation and lower abdominal pain: this is a chronic problem. She recently went to the ER for the same, got abd x-ray and was given meds for constipation that are not helping much. Wants something different.   Preventive Screening-Counseling & Management  Alcohol-Tobacco     Smoking Status: never  Caffeine-Diet-Exercise     Does Patient Exercise: yes     Type of exercise: walking     Times/week: 3  Current Medications  (verified): 1)  Lipitor 20 Mg Tabs (Atorvastatin Calcium) .... Take 1 Tablet By Mouth Once A Day 2)  Metformin Hcl 500 Mg Tabs (Metformin Hcl) .... Take 1 Tablet By Mouth Two Times A Day 3)  Lantus 100 Unit/ml  Soln (Insulin Glargine) .... Take 45 Units At Bedtime 4)  Ferrous Sulfate 325 (65 Fe) Mg  Tbec (Ferrous Sulfate) .... Take 1 Tablet By Mouth Three Times A Day 5)  Methotrexate 2.5 Mg  Tabs (Methotrexate Sodium) .... 4 Tablets Every Thursday 6)  Furosemide 40 Mg  Tabs (Furosemide) .... Take 1 Tablet By Mouth Once Daily 7)  Folic Acid 1 Mg  Tabs (Folic Acid) .... Take 1 Tablet By Mouth Once Daily 8)  Amlodipine Besylate 5 Mg  Tabs (Amlodipine Besylate) .... Take 1 Tablet By Mouth Once Daily 9)  Adult Aspirin Ec Low Strength 81 Mg  Tbec (Aspirin) .... Take 1 Tablet By Mouth Once A Day 10)  Prednisone 10 Mg Tabs (Prednisone) .... One Tablet By Mouth Every Other Day 11)  Nexium 40 Mg Cpdr (Esomeprazole Magnesium) .... One Tablet By Mouth Daily 12)  Vicodin 5-500 Mg Tabs (Hydrocodone-Acetaminophen) .... One Tablet By Mouth Every 6 Hours As Needed For Pain 13)  Ambien 5 Mg Tabs (Zolpidem Tartrate) .... One Tablet By Mouth At Night Before Bed 14)  Advair Diskus 100-50 Mcg/dose Aepb (Fluticasone-Salmeterol) .... 2 Puffs Twice Daily. 15)  Mobic 7.5 Mg Tabs (Meloxicam) .... Take 1-2  Tablets By Mouth Once A Day 16)  Calcium 500/d 500-400 Mg-Unit  Chew (Calcium-Vitamin D) .... Take 1 Tablet By Mouth Two Times A Day 17)  Prodigy Autocode Blood Glucose  Devi (Blood Glucose Monitoring Suppl) .... One Device 18)  Prodigy Twist Top Lancets 28g  Misc (Lancets) .... Use To Check Blood Sugar  Times A Day 19)  Prodigy Insulin Syringe 28g X 1/2" 1 Ml Misc (Insulin Syringe-Needle U-100) .... Use To Inject Insulin Once Daily 20)  Prodigy Autocode Blood Glucose  Strp (Glucose Blood) .... Use To Check Blood Sugar 3x Daily 21)  Prodigy Autocode Blood Glucose  Devi (Blood Glucose Monitoring Suppl) .... Use To Check  Blood Sugar 3x A Day 22)  Prodigy Pocket Blood Glucose W/device Kit (Blood Glucose Monitoring Suppl) .... Test Once A Day  Allergies (verified): No Known Drug Allergies  Past History:  Past Medical History: Last updated: 01/28/2010 Sarcoidosis with brain involvement Obstructive sleep apnea Prolonged QT Osteoporosis Iron deficiency anemia gastroesophageal reflux disease Diabetes mellitus type II Peripheral neuropathy Hypertension Hx of nephrolithiasis Chronic fatigue  kidney  stone  Past Surgical History: Last updated: 01/28/2010  Hysteroscopy, dilation and curettage, Novasure  ablation.    Cystoscopy, ureteroscopy with stone extraction, left retrograde pyelogram.  Family History: Last updated: 01/28/2010   Questionable coronary disease; arrhythmia in her   mother.   Social History: Last updated: 01/28/2010  Lives in [place] with 2 sons and her husband;   disabled; no use of tobacco products nor alcohol.   Risk Factors: Exercise: yes (02/27/2010)  Risk Factors: Smoking Status: never (02/27/2010)  Review of Systems       as per HPI  Physical Exam  General:  alert and well-developed.   Head:  normocephalic and atraumatic.   Eyes:  pupils round and pupils reactive to light.   Mouth:  pharynx pink and moist and no erythema.   Lungs:  normal respiratory effort and normal breath sounds.   Heart:  normal rate and regular rhythm.   Abdomen:  soft and non-tender.   Pulses:  normal peripheral pulses  Extremities:  no cyanosis, clubbing or edema  Neurologic:  non focal   Impression & Recommendations:  Problem # 1:  CHEST PAIN UNSPECIFIED (ICD-786.50) Diff incl pna, pe or musculoskeletal pain. Will get CXR to look for pna. Low pretest prob for PE, will check d-dimer. Will treat symptomatically.   Orders: T-Comprehensive Metabolic Panel [id number]) T-D-[facility]) [phone]) CXR- 2view (CXR) T-CBC w/Diff ([id number])  Problem # 2:  HYPERTENSION  (ICD-401.9) 155/91 today. Patient in pain, will review when pain free.   Her updated medication list for this problem includes:    Furosemide 40 Mg Tabs (Furosemide) .Marland Kitchen... Take 1 tablet by mouth once daily    Amlodipine Besylate 5 Mg Tabs (Amlodipine besylate) .Marland Kitchen... Take 1 tablet by mouth once daily  Problem # 3:  HYPERLIPIDEMIA (ICD-272.4) Chol: 124 (01/02/2009 8:17:00 PM)HDL:  38 (01/02/2009 8:17:00 PM)LDL:  71 (01/02/2009 8:17:00 PM)Tri:   AST:  24 (03/21/2009 3:35:00 PM) ALT:  20 (03/21/2009 3:35:00 PM)T. Bili:  0.2 (03/21/2009 3:35:00 PM) AP:  69 (03/21/2009 3:35:00 PM)  LDL @ goal.  Her updated medication list for this problem includes:    Lipitor 20 Mg Tabs (Atorvastatin calcium) .Marland Kitchen... Take 1  tablet by mouth once a day  Problem # 4:  DIABETES MELLITUS, TYPE II (ICD-250.00) No changes today; data reviewed: A1c: 5.7 (02/27/2010 10:09:21 AM)  MICROALB/CR: 4.8 (03/21/2009 5:14:00 PM) LDL: 71 (01/02/2009 8:17:00 PM) EYE: will need referral.  FOOT: yes (01/02/2009 3:16:40 PM)   Her updated medication list for this problem includes:    Metformin Hcl 500 Mg Tabs (Metformin hcl) .Marland Kitchen... Take 1 tablet by mouth two times a day    Lantus 100 Unit/ml Soln (Insulin glargine) .Marland Kitchen... Take 45 units at bedtime    Adult Aspirin Ec Low Strength 81 Mg Tbec (Aspirin) .Marland Kitchen... Take 1 tablet by mouth once a day  Problem # 5:  SARCOIDOSIS (ICD-135) Neurosarcoid follows up with neuro. Will check cbc, c-met today (on methotrexate).   Complete Medication List: 1)  Lipitor 20 Mg Tabs (Atorvastatin calcium) .... Take 1 tablet by mouth once a day 2)  Metformin Hcl 500 Mg Tabs (Metformin hcl) .... Take 1 tablet by mouth two times a day 3)  Lantus 100 Unit/ml Soln (Insulin glargine) .... Take 45 units at bedtime 4)  Ferrous Sulfate 325 (65 Fe) Mg Tbec (Ferrous sulfate) .... Take 1 tablet by mouth three times a day 5)  Methotrexate 2.5 Mg Tabs (Methotrexate sodium) .... 4 tablets every thursday 6)  Furosemide 40 Mg  Tabs (Furosemide) .... Take 1 tablet by mouth once daily 7)  Folic Acid 1 Mg Tabs (Folic acid) .... Take 1 tablet by mouth once daily 8)  Amlodipine Besylate 5 Mg Tabs (Amlodipine besylate) .... Take 1 tablet by mouth once daily 9)  Adult Aspirin Ec Low Strength 81 Mg Tbec (Aspirin) .... Take 1 tablet by mouth once a day 10)  Prednisone 10 Mg Tabs (Prednisone) .... One tablet by mouth every other day 11)  Nexium 40 Mg Cpdr (Esomeprazole magnesium) .... One tablet by mouth daily 12)  Vicodin 5-500 Mg Tabs (Hydrocodone-acetaminophen) .... One tablet by mouth every 6 hours as needed for pain 13)  Ambien 5 Mg Tabs (Zolpidem tartrate) .... One tablet by mouth at night before bed 14)  Advair Diskus 100-50 Mcg/dose Aepb (Fluticasone-salmeterol) .... 2 puffs twice daily. 15)  Mobic 7.5 Mg Tabs (Meloxicam) .... Take 1-2  tablets by mouth once a day 16)  Calcium 500/d 500-400 Mg-unit Chew (Calcium-vitamin d) .... Take 1 tablet by mouth two times a day 17)  Prodigy Autocode Blood Glucose Devi (Blood glucose monitoring suppl) .... One device 18)  Prodigy Twist Top Lancets 28g Misc (Lancets) .... Use to check blood sugar  times a day 19)  Prodigy Insulin Syringe 28g X 1/2" 1 Ml Misc (Insulin syringe-needle u-100) .... Use to inject insulin once daily 20)  Prodigy Autocode Blood Glucose Strp (Glucose blood) .... Use to check blood sugar 3x daily 21)  Prodigy Autocode Blood Glucose Devi (Blood glucose monitoring suppl) .... Use to check blood sugar 3x a day 22)  Prodigy Pocket Blood Glucose W/device Kit (Blood glucose monitoring suppl) .... Test once a day  Other Orders: T- Capillary Blood Glucose ([id number]) T-Hgb A1C (in-house) ([id number])  Patient Instructions: 1)  We will let you know if anything wrong with your lab work.   2)  Please schedule a follow-up appointment in 1 month. Process Orders Check Orders Results:     [facility]: ABN not required for this insurance Tests Sent for  requisitioning (February 27, 2010 11:18 AM):     02/27/2010: [facility] -- T-Comprehensive Metabolic Panel [phone] (signed)  02/27/2010: [facility] -- T-CBC w/Diff [[id number]] (signed)    Prevention & Chronic Care Immunizations   Influenza vaccine: Fluvax 3+  (09/05/2009)    Tetanus booster: Not documented   Td booster deferral: Deferred  (02/27/2010)    Pneumococcal vaccine: Not documented  Other Screening   Pap smear: Not documented   Pap smear action/deferral: Deferred-2 yr interval  (09/05/2009)    Mammogram: Not documented   Mammogram action/deferral: Deferred-2 yr interval  (09/05/2009)   Smoking status: never  (02/27/2010)  Diabetes Mellitus   HgbA1C: 5.7  (02/27/2010)    Eye exam: Not documented   Diabetic eye exam action/deferral: Ophthalmology referral  (10/11/2009)    Foot exam: yes  (01/02/2009)   High risk foot: No  (01/02/2009)   Foot care education: Not documented    Urine microalbumin/creatinine ratio: 4.8  (03/21/2009)    Diabetes flowsheet reviewed?: Yes   Progress toward A1C goal: At goal  Lipids   Total Cholesterol: 124  (01/02/2009)   LDL: 71  (01/02/2009)   LDL Direct: Not documented   HDL: 38  (01/02/2009)   Triglycerides: 75  (01/02/2009)    SGOT (AST): 24  (03/21/2009)   SGPT (ALT): 20  (03/21/2009) CMP ordered    Alkaline phosphatase: 69  (03/21/2009)   Total bilirubin: 0.2  (03/21/2009)    Lipid flowsheet reviewed?: Yes   Progress toward LDL goal: At goal  Hypertension   Last Blood Pressure: 155 / 91  (02/27/2010)   Serum creatinine: 0.71  (03/21/2009)   Serum potassium 4.2  (03/21/2009) CMP ordered     Hypertension flowsheet reviewed?: Yes   Progress toward BP goal: Unchanged  Self-Management Support :   Personal Goals (by the next clinic visit) :     Personal A1C goal: 6  (10/11/2009)     Personal blood pressure goal: 130/80  (10/11/2009)     Personal LDL goal: 100   (10/11/2009)    Patient will work on the following items until the next clinic visit to reach self-care goals:     Medications and monitoring: take my medicines every day, check my blood sugar, bring all of my medications to every visit, examine my feet every day  (02/27/2010)     Eating: drink diet soda or water instead of juice or soda, eat more vegetables, use fresh or frozen vegetables, eat foods that are low in salt, eat baked foods instead of fried foods, eat fruit for snacks and desserts, limit or avoid alcohol  (02/27/2010)     Activity: take a 30 minute walk every day  (02/27/2010)    Diabetes self-management support: Resources for patients handout  (02/27/2010)    Hypertension self-management support: Resources for patients handout  (02/27/2010)    Lipid self-management support: Resources for patients handout  (02/27/2010)        Resource handout printed.   Laboratory Results   Blood Tests   Date/Time Received: February 27, 2010 10:42 AM  Date/Time Reported: Burke Keels  February 27, 2010 10:42 AM   HGBA1C: 5.7%   (Normal Range: Non-Diabetic - 3-6%   Control Diabetic - 6-8%) CBG Random:: 113mg /dL      Patient Instructions: 1)  We will let you know if anything wrong with your lab work.   2)  Please schedule a follow-up appointment in 1 month.  Appended Document: Orders Update    Clinical Lists Changes  Orders: Added new Test order of T-D-Dimer Fibrin Derivatives Quantitive [phone]) - Signed  Process Orders Check Orders Results:     [facility]: ABN not required for this insurance Tests Sent for requisitioning (February 27, 2010 2:14 PM):     02/27/2010: [facility] -- T-D-Dimer Fibrin Derivatives Quantitive [phone] (signed)

## 2010-12-31 NOTE — Progress Notes (Signed)
Summary: Refill/gh  Phone Note Refill Request Message from:  Fax from Pharmacy on January 29, 2010 9:55 AM  Refills Requested: Medication #1:  NEXIUM 40 MG CPDR one tablet by mouth daily   Last Refilled: 12/26/2009  Medication #2:  PRODIGY AUTOCODE BLOOD GLUCOSE  STRP use to check blood sugar 3x daily [BMN]   Last Refilled: 12/22/2009  Medication #3:  MOBIC 7.5 MG TABS Take 1-2  tablets by mouth once a day.   Last Refilled: 12/10/2009  Method Requested: Electronic Initial call taken by: Angelina Ok RN,  January 29, 2010 9:55 AM    Prescriptions: PRODIGY AUTOCODE BLOOD GLUCOSE  STRP (GLUCOSE BLOOD) use to check blood sugar 3x daily Brand medically necessary #150 x 10   Entered and Authorized by:   Laren Everts MD   Signed by:   Laren Everts MD on 01/30/2010   Method used:   Electron[facility] to        [facility] [address] [phone]* (retail)       [address]       [place], [place]  [address]       Ph: [phone] or [phone]       Fax: [phone]   RxID:   [id number] NEXIUM 40 MG CPDR (ESOMEPRAZOLE MAGNESIUM) one tablet by mouth daily  #30 x 6   Entered and Authorized by:   Laren Everts MD   Signed by:   Laren Everts MD on 01/30/2010   Method used:   [facility]        [facility] [address] [phone]* (retail)       [address]       [place], [place]  [address]       Ph: [phone] or [phone]       Fax: [phone]   RxID:   [id number]

## 2010-12-31 NOTE — Progress Notes (Signed)
Summary: med refil/gp  Phone Note Refill Request Message from:  Fax from Pharmacy on September 27, 2010 11:37 AM  Refills Requested: Medication #1:  NEXIUM 40 MG CPDR one tablet by mouth daily   Last Refilled: 08/27/2010 Last appt 10/27.   Method Requested: Electronic Initial call taken by: Chinita Pester RN,  September 27, 2010 11:37 AM    Prescriptions: NEXIUM 40 MG CPDR (ESOMEPRAZOLE MAGNESIUM) one tablet by mouth daily  #30 x 6   Entered and Authorized by:   Laren Everts MD   Signed by:   Laren Everts MD on 09/27/2010   Method used:   [facility]        [facility] [address] [phone]* (retail)       [address]       [place], [place]  [address]       Ph: [phone] or [phone]       Fax: [phone]   RxID:   [id number]

## 2010-12-31 NOTE — Progress Notes (Signed)
Summary: med refill/gp  Phone Note Refill Request Message from:  Fax from Pharmacy on February 14, 2010 9:04 AM  Refills Requested: Medication #1:  METFORMIN HCL 500 MG TABS Take 1 tablet by mouth two times a day   Last Refilled: 01/10/2010 Last OV 10/11/09.   Method Requested: Electronic Initial call taken by: Chinita Pester RN,  February 14, 2010 9:04 AM    Prescriptions: METFORMIN HCL 500 MG TABS (METFORMIN HCL) Take 1 tablet by mouth two times a day  #60 x 3   Entered and Authorized by:   Laren Everts MD   Signed by:   Laren Everts MD on 02/14/2010   Method used:   [facility]        [facility] [address] [phone]* (retail)       [address]       [place], [place]  [address]       Ph: [phone] or [phone]       Fax: [phone]   RxID:   [id number]

## 2010-12-31 NOTE — Assessment & Plan Note (Signed)
Summary: Prior Approval- Lipitor/ Advair  Prior Authorization was approved for Lipitor 20 mg tablets 02/28/2010 thru 02/27/2010.  Prior Authorization had been approved for Advair 100-5- micrograms 01/20/2009 thur 01/19/2011.  According to agent pt has never picked up the Advair. Angelina Ok RN  February 28, 2010 9:51 AM  Noted. Thank you.

## 2010-12-31 NOTE — Progress Notes (Signed)
Summary: refill/ hla  Phone Note Refill Request Message from:  Fax from Pharmacy on December 12, 2009 5:02 PM  Refills Requested: Medication #1:  MOBIC 7.5 MG TABS Take 1-2  tablets by mouth once a day.   Last Refilled: 12/10 Initial call taken by: Marin Roberts RN,  December 12, 2009 5:02 PM    Denied. If patient has continued hip pain she needs to return to the clinic to discuss further management.

## 2010-12-31 NOTE — Assessment & Plan Note (Signed)
Summary: EST-1 MONTH RECHECK/CH   Vital Signs:  Patient profile:   45 year old female Height:      68 inches Weight:      234.1 pounds BMI:     35.72 Temp:     97.4 degrees F oral Pulse rate:   97 / minute BP sitting:   140 / 70  (right arm)  Vitals Entered By: Filomena Jungling NT II (Apr 17, 2010 2:58 PM) Is Patient Diabetic? Yes Did you bring your meter with you today? No Nutritional Status BMI of > 30 = obese CBG Result 71  Have you ever been in a relationship where you felt threatened, hurt or afraid?No   Does patient need assistance? Functional Status Self care Ambulation Normal   Primary Care Provider:  Laren Everts MD   History of Present Illness: 45 yr old woman with pmhx as described below comes to the clinic for follow up. Reports that she has had some episodes of hypoglycemic events which she attributes to taking insulin but not eating well. She did not bring meter.   Complains of constipation although she has been using miralax, and stool softeners. She went to see Dr. Elnoria Howard but would like to see another Gastroenterologist. Last bowel movement was 3 days ago. Denies any abdominal pain at this moment.   Preventive Screening-Counseling & Management  Alcohol-Tobacco     Smoking Status: never  Caffeine-Diet-Exercise     Does Patient Exercise: yes     Type of exercise: walking     Times/week: 3  Problems Prior to Update: 1)  Chest Pain Unspecified  (ICD-786.50) 2)  Hyperlipidemia  (ICD-272.4) 3)  Hypercholesterolemia  (ICD-272.0) 4)  Diabetes Mellitus, Type II  (ICD-250.00) 5)  Hypertension  (ICD-401.9) 6)  Sarcoidosis  (ICD-135) 7)  Fatigue, Chronic  (ICD-780.79) 8)  Obstructive Sleep Apnea  (ICD-327.23) 9)  Gerd  (ICD-530.81) 10)  Hip Pain, Right, Chronic  (ICD-719.45) 11)  Constipation Nos  (ICD-564.00) 12)  Nephrolithiasis, Hx of  (ICD-V13.01) 13)  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 Take 1 Tablet By Mouth Once A Day 2)  Metformin Hcl 500 Mg Tabs (Metformin Hcl) .... Take 1 Tablet By Mouth Two Times A Day 3)  Lantus 100 Unit/ml  Soln (Insulin Glargine) .... Take 50 Untis Morning 4)  Ferrous Sulfate 325 (65 Fe) Mg  Tbec (Ferrous Sulfate) .... Take 1 Tablet By Mouth Three Times A Day 5)  Methotrexate 2.5 Mg  Tabs (Methotrexate Sodium) .... 4 Tablets Every Thursday 6)  Furosemide 40 Mg  Tabs (Furosemide) .... Take 1 Tablet By Mouth Once Daily 7)  Folic Acid 1 Mg  Tabs (Folic Acid) .... Take 1 Tablet By Mouth Once Daily 8)  Amlodipine Besylate 5 Mg  Tabs (Amlodipine Besylate) .... Take 1 Tablet By Mouth Once Daily 9)  Adult Aspirin Ec Low Strength 81 Mg  Tbec (Aspirin) .... Take 1 Tablet By Mouth Once A Day 10)  Nexium 40 Mg Cpdr (Esomeprazole Magnesium) .... One Tablet By Mouth Daily 11)  Ambien 5 Mg Tabs (Zolpidem Tartrate) .... One Tablet By Mouth At Night Before Bed 12)  Advair Diskus 100-50 Mcg/dose Aepb (Fluticasone-Salmeterol) .... 2 Puffs Twice Daily. 13)  Mobic 7.5 Mg Tabs (Meloxicam) .Marland KitchenMarland KitchenMarland Kitchen  Take 1-2  Tablets By Mouth Once A Day 14)  Calcium 500/d 500-400 Mg-Unit  Chew (Calcium-Vitamin D) .... Take 1 Tablet By Mouth Two Times A Day 15)  Prodigy Autocode Blood Glucose  Devi (Blood Glucose Monitoring Suppl) .... One Device 16)  Prodigy Twist Top Lancets 28g  Misc (Lancets) .... Use To Check Blood Sugar  Times A Day 17)  Prodigy Insulin Syringe 28g X 1/2" 1 Ml Misc (Insulin Syringe-Needle U-100) .... Use To Inject Insulin Once Daily 18)  Prodigy Autocode Blood Glucose  Strp (Glucose Blood) .... Use To Check Blood Sugar 3x Daily 19)  Prodigy Autocode Blood Glucose  Devi (Blood Glucose Monitoring Suppl) .... Use To Check Blood Sugar 3x A Day 20)  Prodigy Pocket Blood  Glucose W/device Kit (Blood Glucose Monitoring Suppl) .... Test Once A Day  Current Medications (verified): 1)  Lipitor 20 Mg Tabs (Atorvastatin Calcium) .... Take 1 Tablet By Mouth Once A Day 2)  Metformin Hcl 500 Mg Tabs (Metformin Hcl) .... Take 1 Tablet By Mouth Two Times A Day 3)  Lantus 100 Unit/ml  Soln (Insulin Glargine) .... Take 50 Untis Morning 4)  Ferrous Sulfate 325 (65 Fe) Mg  Tbec (Ferrous Sulfate) .... Take 1 Tablet By Mouth Three Times A Day 5)  Methotrexate 2.5 Mg  Tabs (Methotrexate Sodium) .... 4 Tablets Every Thursday 6)  Furosemide 40 Mg  Tabs (Furosemide) .... Take 1 Tablet By Mouth Once Daily 7)  Folic Acid 1 Mg  Tabs (Folic Acid) .... Take 1 Tablet By Mouth Once Daily 8)  Amlodipine Besylate 5 Mg  Tabs (Amlodipine Besylate) .... Take 1 Tablet By Mouth Once Daily 9)  Adult Aspirin Ec Low Strength 81 Mg  Tbec (Aspirin) .... Take 1 Tablet By Mouth Once A Day 10)  Nexium 40 Mg Cpdr (Esomeprazole Magnesium) .... One Tablet By Mouth Daily 11)  Ambien 5 Mg Tabs (Zolpidem Tartrate) .... One Tablet By Mouth At Night Before Bed 12)  Advair Diskus 100-50 Mcg/dose Aepb (Fluticasone-Salmeterol) .... 2 Puffs Twice Daily. 13)  Mobic 7.5 Mg Tabs (Meloxicam) .... Take 1-2  Tablets By Mouth Once A Day 14)  Calcium 500/d 500-400 Mg-Unit  Chew (Calcium-Vitamin D) .... Take 1 Tablet By Mouth Two Times A Day 15)  Prodigy Autocode Blood Glucose  Devi (Blood Glucose Monitoring Suppl) .... One Device 16)  Prodigy Twist Top Lancets 28g  Misc (Lancets) .... Use To Check Blood Sugar  Times A Day 17)  Prodigy Insulin Syringe 28g X 1/2" 1 Ml Misc (Insulin Syringe-Needle U-100) .... Use To Inject Insulin Once Daily 18)  Prodigy Autocode Blood Glucose  Strp (Glucose Blood) .... Use To Check Blood Sugar 3x Daily 19)  Prodigy Autocode Blood Glucose  Devi (Blood Glucose Monitoring Suppl) .... Use To Check Blood Sugar 3x A Day 20)  Prodigy Pocket Blood Glucose W/device Kit (Blood Glucose Monitoring Suppl)  .... Test Once A Day  Allergies: No Known Drug Allergies  Past History:  Past Medical History: Last updated: 01/28/2010 Sarcoidosis with brain involvement Obstructive sleep apnea Prolonged QT Osteoporosis Iron deficiency anemia gastroesophageal reflux disease Diabetes mellitus type II Peripheral neuropathy Hypertension Hx of nephrolithiasis Chronic fatigue  kidney  stone  Past Surgical History: Last updated: 01/28/2010  Hysteroscopy, dilation and curettage, Novasure  ablation.    Cystoscopy, ureteroscopy with stone extraction, left retrograde pyelogram.  Family History: Last updated: 01/28/2010   Questionable coronary disease; arrhythmia in her   mother.   Social History: Last updated: 01/28/2010  Lives in  [place] with 2 sons and her husband;   disabled; no use of tobacco products nor alcohol.   Risk Factors: Exercise: yes (04/17/2010)  Risk Factors: Smoking Status: never (04/17/2010)  Family History: Reviewed history from 01/28/2010 and no changes required.   Questionable coronary disease; arrhythmia in her   mother.   Social History: Reviewed history from 01/28/2010 and no changes required.  Lives in [place] with 2 sons and her husband;   disabled; no use of tobacco products nor alcohol.   Review of Systems       The patient complains of headaches.  The patient denies fever, chest pain, dyspnea on exertion, peripheral edema, hemoptysis, abdominal pain, melena, hematochezia, hematuria, and muscle weakness.    Physical Exam  General:  alert and well-developed.   Mouth:  MMM Neck:  supple  Lungs:  normal respiratory effort and normal breath sounds.   Heart:  normal rate and regular rhythm.   Abdomen:  soft and non-tender.  normal bowel sounds, no distention, no masses, no guarding, no rigidity, and no rebound tenderness.   Msk:    Extremities:  no cyanosis, clubbing or edema  Neurologic:  Nonfocal Skin:  C-section scar noted   Impression &  Recommendations:  Problem # 1:  DIABETES MELLITUS, TYPE II (ICD-250.00) Patient having hypoglycemic events. HgA1c 5.7. Patient instructed to decrease Lantus dose to 35 units. Instructed to check blood glucose levels at least twice a day and bring meter with her on follow up for further evaluation.  Her updated medication list for this problem includes:    Metformin Hcl 500 Mg Tabs (Metformin hcl) .Marland Kitchen... Take 1 tablet by mouth two times a day    Lantus 100 Unit/ml Soln (Insulin glargine) .Marland Kitchen... Take 35 untis morning    Adult Aspirin Ec Low Strength 81 Mg Tbec (Aspirin) .Marland Kitchen... Take 1 tablet by mouth once a day  Orders: T- Capillary Blood Glucose (11914) T-Hgb A1C (in-house) ([id number]) T-Urine Microalbumin w/creat. ratio [phone])  Labs Reviewed: Creat: 0.75 (02/27/2010)    Reviewed HgBA1c results: 5.7 (04/17/2010)  5.7 (02/27/2010)  Problem # 2:  HYPERTENSION (ICD-401.9) Controlled. Continue current regimen.  Her updated medication list for this problem includes:    Furosemide 40 Mg Tabs (Furosemide) .Marland Kitchen... Take 1 tablet by mouth once daily    Amlodipine Besylate 5 Mg Tabs (Amlodipine besylate) .Marland Kitchen... Take 1 tablet by mouth once daily  BP today: 140/70 Prior BP: 139/83 (03/26/2010)  Labs Reviewed: K+: 4.6 (02/27/2010) Creat: : 0.75 (02/27/2010)   Chol: 124 (01/02/2009)   HDL: 38 (01/02/2009)   LDL: 71 (01/02/2009)   TG: 75 (01/02/2009)  Problem # 3:  CONSTIPATION NOS (ICD-564.00) Patient has been evaluation for constipation which is not responsive to medical management by Dr. Elnoria Howard but would like to be referred for a second option. Instructed to continue taking miralax, increase fiber and fluid intake. Will follow up.  Orders: Gastroenterology Referral (GI)  Problem # 4:  SARCOIDOSIS (ICD-135) Neurosarcoid per Dr. Anne Hahn. On methotrexate. Check cmet and cbd w/ diff on follow up to monitor for adverse reactions.  Problem # 5:  HYPERLIPIDEMIA (ICD-272.4) At goal. Recheck FLP  on follow up.  Her updated medication list for this problem includes:    Lipitor 20 Mg Tabs (Atorvastatin calcium) .Marland Kitchen... Take 1 tablet by mouth once a day  Labs Reviewed: SGOT: 19 (02/27/2010)   SGPT: 15 (02/27/2010)   HDL:38 (01/02/2009), 40 (08/24/2007)  LDL:71 (01/02/2009), 77 (08/24/2007)  Chol:124 (01/02/2009), 130 (08/24/2007)  Trig:75 (01/02/2009), 64 (08/24/2007)  Problem # 6:  Preventive Health Care (ICD-V70.0) Will discuss need for Pap smear and Mammogram on follow up.  Complete Medication List: 1)  Lipitor 20 Mg Tabs (Atorvastatin calcium) .... Take 1 tablet by mouth once a day 2)  Metformin Hcl 500 Mg Tabs (Metformin hcl) .... Take 1 tablet by mouth two times a day 3)  Lantus 100 Unit/ml Soln (Insulin glargine) .... Take 35 untis morning 4)  Ferrous Sulfate 325 (65 Fe) Mg Tbec (Ferrous sulfate) .... Take 1 tablet by mouth three times a day 5)  Methotrexate 2.5 Mg Tabs (Methotrexate sodium) .... 4 tablets every thursday 6)  Furosemide 40 Mg Tabs (Furosemide) .... Take 1 tablet by mouth once daily 7)  Folic Acid 1 Mg Tabs (Folic acid) .... Take 1 tablet by mouth once daily 8)  Amlodipine Besylate 5 Mg Tabs (Amlodipine besylate) .... Take 1 tablet by mouth once daily 9)  Adult Aspirin Ec Low Strength 81 Mg Tbec (Aspirin) .... Take 1 tablet by mouth once a day 10)  Nexium 40 Mg Cpdr (Esomeprazole magnesium) .... One tablet by mouth daily 11)  Ambien 5 Mg Tabs (Zolpidem tartrate) .... One tablet by mouth at night before bed 12)  Advair Diskus 100-50 Mcg/dose Aepb (Fluticasone-salmeterol) .... 2 puffs twice daily. 13)  Mobic 7.5 Mg Tabs (Meloxicam) .... Take 1-2  tablets by mouth once a day 14)  Calcium 500/d 500-400 Mg-unit Chew (Calcium-vitamin d) .... Take 1 tablet by mouth two times a day 15)  Prodigy Autocode Blood Glucose Devi (Blood glucose monitoring suppl) .... One device 16)  Prodigy Twist Top Lancets 28g Misc (Lancets) .... Use to check blood sugar  times a day 17)   Prodigy Insulin Syringe 28g X 1/2" 1 Ml Misc (Insulin syringe-needle u-100) .... Use to inject insulin once daily 18)  Prodigy Autocode Blood Glucose Strp (Glucose blood) .... Use to check blood sugar 3x daily 19)  Prodigy Autocode Blood Glucose Devi (Blood glucose monitoring suppl) .... Use to check blood sugar 3x a day 20)  Prodigy Pocket Blood Glucose W/device Kit (Blood glucose monitoring suppl) .... Test once a day  Patient Instructions: 1)  Please schedule a follow-up appointment in 2 months. 2)  Take all medication as directed. 3)  Remember decrease Lantus dose to 35 units in am. 4)  Go to Gastroenterologist referral.   Process Orders Check Orders Results:     [facility]: Order checked:     Laren Everts MD NOT AUTHORIZED TO ORDER Tests Sent for requisitioning (Apr 20, 2010 1:12 PM):     04/17/2010: [facility] -- T-Urine Microalbumin w/creat. ratio [[id number]] (signed)    Laboratory Results   Blood Tests   Date/Time Received: Apr 17, 2010 3:29 PM Date/Time Reported: Burke Keels  Apr 17, 2010 3:29 PM   HGBA1C: 5.7%   (Normal Range: Non-Diabetic - 3-6%   Control Diabetic - 6-8%) CBG Random:: 71mg /dL     Process Orders Check Orders Results:     [facility]: Order checked:     Laren Everts MD NOT AUTHORIZED TO ORDER Tests Sent for requisitioning (Apr 20, 2010 1:12 PM):     04/17/2010: [facility] -- T-Urine Microalbumin w/creat. ratio [[id number]] (signed)    Prevention & Chronic Care Immunizations   Influenza vaccine: Fluvax 3+  (09/05/2009)    Tetanus booster: Not documented   Td booster deferral: Deferred  (02/27/2010)    Pneumococcal vaccine: Not documented  Other Screening   Pap smear: Not  documented   Pap smear action/deferral: Deferred-2 yr interval  (09/05/2009)    Mammogram: Not documented   Mammogram action/deferral: Deferred-2 yr interval   (09/05/2009)   Smoking status: never  (04/17/2010)  Diabetes Mellitus   HgbA1C: 5.7  (04/17/2010)    Eye exam: Not documented   Diabetic eye exam action/deferral: Ophthalmology referral  (10/11/2009)    Foot exam: yes  (01/02/2009)   High risk foot: No  (01/02/2009)   Foot care education: Not documented    Urine microalbumin/creatinine ratio: 4.8  (03/21/2009)   Urine microalbumin action/deferral: Ordered    Diabetes flowsheet reviewed?: Yes   Progress toward A1C goal: At goal  Lipids   Total Cholesterol: 124  (01/02/2009)   LDL: 71  (01/02/2009)   LDL Direct: Not documented   HDL: 38  (01/02/2009)   Triglycerides: 75  (01/02/2009)    SGOT (AST): 19  (02/27/2010)   SGPT (ALT): 15  (02/27/2010)   Alkaline phosphatase: 72  (02/27/2010)   Total bilirubin: 0.3  (02/27/2010)    Lipid flowsheet reviewed?: Yes   Progress toward LDL goal: At goal  Hypertension   Last Blood Pressure: 140 / 70  (04/17/2010)   Serum creatinine: 0.75  (02/27/2010)   Serum potassium 4.6  (02/27/2010)    Hypertension flowsheet reviewed?: Yes   Progress toward BP goal: At goal  Self-Management Support :   Personal Goals (by the next clinic visit) :     Personal A1C goal: 6  (10/11/2009)     Personal blood pressure goal: 130/80  (10/11/2009)     Personal LDL goal: 100  (10/11/2009)    Patient will work on the following items until the next clinic visit to reach self-care goals:     Medications and monitoring: take my medicines every day, check my blood sugar, examine my feet every day  (04/17/2010)     Eating: drink diet soda or water instead of juice or soda, eat more vegetables, use fresh or frozen vegetables, eat foods that are low in salt, eat baked foods instead of fried foods, eat fruit for snacks and desserts, limit or avoid alcohol  (04/17/2010)     Activity: take a 30 minute walk every day  (04/17/2010)    Diabetes self-management support: Written self-care plan  (04/17/2010)    Diabetes care plan printed    Hypertension self-management support: Written self-care plan  (04/17/2010)   Hypertension self-care plan printed.    Lipid self-management support: Written self-care plan  (04/17/2010)   Lipid self-care plan printed.

## 2010-12-31 NOTE — Progress Notes (Signed)
Summary: refill/gg  Phone Note Refill Request  on July 03, 2010 11:19 AM  Refills Requested: Medication #1:  METFORMIN HCL 500 MG TABS Take 1 tablet by mouth two times a day   Last Refilled: 06/05/2010  Method Requested: Electronic Initial call taken by: Merrie Roof RN,  July 03, 2010 11:20 AM    Prescriptions: METFORMIN HCL 500 MG TABS (METFORMIN HCL) Take 1 tablet by mouth two times a day  #60 x 3   Entered and Authorized by:   Laren Everts MD   Signed by:   Laren Everts MD on 07/03/2010   Method used:   Electronically to        [facility] [address] [phone]* (retail)       [address]       [place], [place]  [address]       Ph: [phone] or [phone]       Fax: [phone]   RxID:   [id number]

## 2010-12-31 NOTE — Consult Note (Signed)
Summary: [facility]   Imported By: Louretta Parma 10/18/2010 10:46:13  _____________________________________________________________________  External Attachment:    Type:   Image     Comment:   External Document  Appended Document: [facility]    Clinical Lists Changes  Observations: Added new observation of DIAB EYE EX: Neg for diabetic retinopathy (10/04/2010 14:38)

## 2010-12-31 NOTE — Assessment & Plan Note (Signed)
Summary: acute-bad cold-(vega)/cfb   Vital Signs:  Patient profile:   45 year old female Height:      68 inches (172.72 cm) Weight:      215.3 pounds (97.86 kg) BMI:     32.85 Temp:     97.7 degrees F (36.50 degrees C) oral Pulse rate:   84 / minute BP sitting:   131 / 88  (right arm) Cuff size:   large  Vitals Entered By: Cynda Familia Duncan Dull) (June 25, 2010 11:06 AM) CC: pt c/o cold-like symptoms x 5 days,  pt c/o right lower abd pain that feels like it radiating down  Is Patient Diabetic? Yes Did you bring your meter with you today? No Pain Assessment Patient in pain? yes     Location: lower abd Intensity: 7 Type: sharp Onset of pain  off and on for 1wk Nutritional Status BMI of > 30 = obese  Does patient need assistance? Functional Status Self care Ambulation Normal   Primary Care Provider:  Laren Everts MD  CC:  pt c/o cold-like symptoms x 5 days and pt c/o right lower abd pain that feels like it radiating down .  History of Present Illness: Pt is a 45 y/o woman with PMH of DM 2, HTN , Hyperlipidemia, Sarcoidosis( also neurosarcoid), GERD, OSA. She comes to the clinic today with c/o   - Cough and sore throat x 5 days.    She says her throat is sore and fees scratchy and she has cough w/ yellowish sputum. The cough gets worse as the day progresses, more in evening and night when she coughs a lot. Its not getting any worse. She also had nasal congestion with the cough which got improved and she has none now.    C/o some dull pain in her chest B/L with severe coughing, which is intermittent and comes only with coughing.    Denies any fever, redness of eye, headache, SOB.  -Lower Abd pain: dull, constant pain, starts in hypogastrium and goes to RLQ anto to the back upto R lower lumbar area and also goes to R upper thigh. This is a chronic pain which she has since years. She points out the cause to be her C-section done before 18 yrs. She had  post-op wound  complications as dehicensce, infection. She has the similar kind of intermittent pain since then. There is a big vertical scar in midline below umbilicus, which the pt was shy to show as she mentioned its ugly.   Denies any fever,cough,chest pain,SOB, abd pain, N/V,diarrhea, urinary abn, anorexia, wt loss, headache.    Preventive Screening-Counseling & Management  Alcohol-Tobacco     Smoking Status: never  Problems Prior to Update: 1)  Chest Pain Unspecified  (ICD-786.50) 2)  Hyperlipidemia  (ICD-272.4) 3)  Hypercholesterolemia  (ICD-272.0) 4)  Diabetes Mellitus, Type II  (ICD-250.00) 5)  Hypertension  (ICD-401.9) 6)  Sarcoidosis  (ICD-135) 7)  Fatigue, Chronic  (ICD-780.79) 8)  Obstructive Sleep Apnea  (ICD-327.23) 9)  Gerd  (ICD-530.81) 10)  Hip Pain, Right, Chronic  (ICD-719.45) 11)  Constipation Nos  (ICD-564.00) 12)  Nephrolithiasis, Hx of  (ICD-V13.01) 13)  Long Qt Syndrome  (ICD-794.31) 14)  Peroneal Neuropathy  (ICD-956.3) 15)  Osteoporosis  (ICD-733.00) 16)  Anemia, Iron Deficiency Nos  (ICD-280.9) 17)  Hx of Contact With or Exposure To Venereal Diseases  (ICD-V01.6)  Medications Prior to Update: 1)  Lipitor 20 Mg Tabs (Atorvastatin Calcium) .... Take 1 Tablet By Mouth Once A Day  2)  Metformin Hcl 500 Mg Tabs (Metformin Hcl) .... Take 1 Tablet By Mouth Two Times A Day 3)  Lantus 100 Unit/ml  Soln (Insulin Glargine) .... Take 35 Untis Morning 4)  Ferrous Sulfate 325 (65 Fe) Mg  Tbec (Ferrous Sulfate) .... Take 1 Tablet By Mouth Three Times A Day 5)  Methotrexate 2.5 Mg  Tabs (Methotrexate Sodium) .... 4 Tablets Every Thursday 6)  Furosemide 40 Mg  Tabs (Furosemide) .... Take 1 Tablet By Mouth Once Daily 7)  Folic Acid 1 Mg  Tabs (Folic Acid) .... Take 1 Tablet By Mouth Once Daily 8)  Amlodipine Besylate 5 Mg  Tabs (Amlodipine Besylate) .... Take 1 Tablet By Mouth Once Daily 9)  Adult Aspirin Ec Low Strength 81 Mg  Tbec (Aspirin) .... Take 1 Tablet By Mouth Once A  Day 10)  Nexium 40 Mg Cpdr (Esomeprazole Magnesium) .... One Tablet By Mouth Daily 11)  Ambien 5 Mg Tabs (Zolpidem Tartrate) .... One Tablet By Mouth At Night Before Bed 12)  Advair Diskus 100-50 Mcg/dose Aepb (Fluticasone-Salmeterol) .... 2 Puffs Twice Daily. 13)  Mobic 7.5 Mg Tabs (Meloxicam) .... Take 1-2  Tablets By Mouth Once A Day 14)  Calcium 500/d 500-400 Mg-Unit  Chew (Calcium-Vitamin D) .... Take 1 Tablet By Mouth Two Times A Day 15)  Prodigy Autocode Blood Glucose  Devi (Blood Glucose Monitoring Suppl) .... One Device 16)  Prodigy Twist Top Lancets 28g  Misc (Lancets) .... Use To Check Blood Sugar  Times A Day 17)  Prodigy Insulin Syringe 28g X 1/2" 1 Ml Misc (Insulin Syringe-Needle U-100) .... Use To Inject Insulin Once Daily 18)  Prodigy Autocode Blood Glucose  Strp (Glucose Blood) .... Use To Check Blood Sugar 3x Daily 19)  Prodigy Autocode Blood Glucose  Devi (Blood Glucose Monitoring Suppl) .... Use To Check Blood Sugar 3x A Day 20)  Prodigy Pocket Blood Glucose W/device Kit (Blood Glucose Monitoring Suppl) .... Test Once A Day  Current Medications (verified): 1)  Lipitor 20 Mg Tabs (Atorvastatin Calcium) .... Take 1 Tablet By Mouth Once A Day 2)  Metformin Hcl 500 Mg Tabs (Metformin Hcl) .... Take 1 Tablet By Mouth Two Times A Day 3)  Lantus 100 Unit/ml  Soln (Insulin Glargine) .... Take 35 Untis Morning 4)  Ferrous Sulfate 325 (65 Fe) Mg  Tbec (Ferrous Sulfate) .... Take 1 Tablet By Mouth Three Times A Day 5)  Methotrexate 2.5 Mg  Tabs (Methotrexate Sodium) .... 4 Tablets Every Thursday 6)  Furosemide 40 Mg  Tabs (Furosemide) .... Take 1 Tablet By Mouth Once Daily 7)  Folic Acid 1 Mg  Tabs (Folic Acid) .... Take 1 Tablet By Mouth Once Daily 8)  Amlodipine Besylate 5 Mg  Tabs (Amlodipine Besylate) .... Take 1 Tablet By Mouth Once Daily 9)  Adult Aspirin Ec Low Strength 81 Mg  Tbec (Aspirin) .... Take 1 Tablet By Mouth Once A Day 10)  Nexium 40 Mg Cpdr (Esomeprazole Magnesium)  .... One Tablet By Mouth Daily 11)  Ambien 5 Mg Tabs (Zolpidem Tartrate) .... One Tablet By Mouth At Night Before Bed 12)  Advair Diskus 100-50 Mcg/dose Aepb (Fluticasone-Salmeterol) .... 2 Puffs Twice Daily. 13)  Mobic 7.5 Mg Tabs (Meloxicam) .... Take 1-2  Tablets By Mouth Once A Day 14)  Calcium 500/d 500-400 Mg-Unit  Chew (Calcium-Vitamin D) .... Take 1 Tablet By Mouth Two Times A Day 15)  Prodigy Autocode Blood Glucose  Devi (Blood Glucose Monitoring Suppl) .... One Device 16)  Prodigy Twist  Top Lancets 28g  Misc (Lancets) .... Use To Check Blood Sugar  Times A Day 17)  Prodigy Insulin Syringe 28g X 1/2" 1 Ml Misc (Insulin Syringe-Needle U-100) .... Use To Inject Insulin Once Daily 18)  Prodigy Autocode Blood Glucose  Strp (Glucose Blood) .... Use To Check Blood Sugar 3x Daily 19)  Prodigy Autocode Blood Glucose  Devi (Blood Glucose Monitoring Suppl) .... Use To Check Blood Sugar 3x A Day 20)  Prodigy Pocket Blood Glucose W/device Kit (Blood Glucose Monitoring Suppl) .... Test Once A Day  Allergies (verified): No Known Drug Allergies  Social History:  Lives in [place] with 2 sons and her husband;   disabled; no use of tobacco products nor alcohol.   Educaion- Highschool  Review of Systems       as per HPI.Marland Kitchen  Physical Exam  General:  alert , well-developed and well-nourished.   Head:  normocephalic and atraumatic.   Eyes:  pupils round and pupils reactive to light.   no conjuctivitis Ears:  no external deformities.   Mouth:  MMM Lungs:  normal respiratory effort and normal breath sounds.   Heart:  normal rate and regular rhythm.   Abdomen:  soft and non-tender.  normal bowel sounds. Has a vertical scar below umbilicus from C-section 17 yrs before.  tenderness to palpation in hypogastrium around the scar and also in RLQ and lower lumbar region. Pulses:  normal peripheral pulses  Extremities:  no cyanosis, clubbing or edema  Neurologic:  Nonfocal   Impression &  Recommendations:  Problem # 1:  ABDOMINAL PAIN, CHRONIC (ICD-789.00) Assessment Comment Only Pt has chronic abd pain as described in HPI. She has seen Dr. Elnoria Howard, Gastroenterologist, and also an Engineer, petroleum for this problem. Her last visit with Dr. Elnoria Howard was just a week ago. He just advised her to relieve her constipation.  The Plastic surgeon whom she saw a year before said her that she has an hernia which needs to be operated. Advised her to see him again and see what can be done for her abd pain. As the pain seems to be evaluated extensively and she sounded reasonably aware of this pain been chronic and not a new problem, will reassess at next visit.  Problem # 2:  UPPER RESPIRATORY INFECTION (ICD-465.9) She complaints of URI since last 5 days as per HPI. She probably has a viral bronchitis. Advised her to take Delsym cough syrup if her cough gets more disterssing. Will reassess at next visit. Her updated medication list for this problem includes:    Adult Aspirin Ec Low Strength 81 Mg Tbec (Aspirin) .Marland Kitchen... Take 1 tablet by mouth once a day    Mobic 7.5 Mg Tabs (Meloxicam) .Marland Kitchen... Take 1-2  tablets by mouth once a day  Problem # 3:  DIABETES MELLITUS, TYPE II (ICD-250.00)  She didn't bring her meter but says her glucose is well controlled. Her few previous HbA1c are around 5.7. So will continue the same Rx as of now and will reassess at next visit. Her updated medication list for this problem includes:    Metformin Hcl 500 Mg Tabs (Metformin hcl) .Marland Kitchen... Take 1 tablet by mouth two times a day    Lantus 100 Unit/ml Soln (Insulin glargine) .Marland Kitchen... Take 35 untis morning    Adult Aspirin Ec Low Strength 81 Mg Tbec (Aspirin) .Marland Kitchen... Take 1 tablet by mouth once a day  Labs Reviewed: Creat: 0.75 (02/27/2010)    Reviewed HgBA1c results: 5.7 (04/17/2010)  5.7 (02/27/2010)  Problem # 4:  HYPERTENSION (ICD-401.9) Her BP is just above the goal. Will continue the same Rx and reassess at next  visit. Her updated medication list for this problem includes:    Furosemide 40 Mg Tabs (Furosemide) .Marland Kitchen... Take 1 tablet by mouth once daily    Amlodipine Besylate 5 Mg Tabs (Amlodipine besylate) .Marland Kitchen... Take 1 tablet by mouth once daily  BP today: 131/88 Prior BP: 140/70 (04/17/2010)  Labs Reviewed: K+: 4.6 (02/27/2010) Creat: : 0.75 (02/27/2010)   Chol: 124 (01/02/2009)   HDL: 38 (01/02/2009)   LDL: 71 (01/02/2009)   TG: 75 (01/02/2009)  Complete Medication List: 1)  Lipitor 20 Mg Tabs (Atorvastatin calcium) .... Take 1 tablet by mouth once a day 2)  Metformin Hcl 500 Mg Tabs (Metformin hcl) .... Take 1 tablet by mouth two times a day 3)  Lantus 100 Unit/ml Soln (Insulin glargine) .... Take 35 untis morning 4)  Ferrous Sulfate 325 (65 Fe) Mg Tbec (Ferrous sulfate) .... Take 1 tablet by mouth three times a day 5)  Methotrexate 2.5 Mg Tabs (Methotrexate sodium) .... 4 tablets every thursday 6)  Furosemide 40 Mg Tabs (Furosemide) .... Take 1 tablet by mouth once daily 7)  Folic Acid 1 Mg Tabs (Folic acid) .... Take 1 tablet by mouth once daily 8)  Amlodipine Besylate 5 Mg Tabs (Amlodipine besylate) .... Take 1 tablet by mouth once daily 9)  Adult Aspirin Ec Low Strength 81 Mg Tbec (Aspirin) .... Take 1 tablet by mouth once a day 10)  Nexium 40 Mg Cpdr (Esomeprazole magnesium) .... One tablet by mouth daily 11)  Ambien 5 Mg Tabs (Zolpidem tartrate) .... One tablet by mouth at night before bed 12)  Advair Diskus 100-50 Mcg/dose Aepb (Fluticasone-salmeterol) .... 2 puffs twice daily. 13)  Mobic 7.5 Mg Tabs (Meloxicam) .... Take 1-2  tablets by mouth once a day 14)  Calcium 500/d 500-400 Mg-unit Chew (Calcium-vitamin d) .... Take 1 tablet by mouth two times a day 15)  Prodigy Autocode Blood Glucose Devi (Blood glucose monitoring suppl) .... One device 16)  Prodigy Twist Top Lancets 28g Misc (Lancets) .... Use to check blood sugar  times a day 17)  Prodigy Insulin Syringe 28g X 1/2" 1 Ml Misc  (Insulin syringe-needle u-100) .... Use to inject insulin once daily 18)  Prodigy Autocode Blood Glucose Strp (Glucose blood) .... Use to check blood sugar 3x daily 19)  Prodigy Autocode Blood Glucose Devi (Blood glucose monitoring suppl) .... Use to check blood sugar 3x a day 20)  Prodigy Pocket Blood Glucose W/device Kit (Blood glucose monitoring suppl) .... Test once a day  Patient Instructions: 1)  Please schedule a follow-up appointment in 3 months. 2)  For your cough you can take DELSYM cough syrup from pharmacy. And also you can take allegy meds from pharmacy if you still have sore throat and nose congestion. 3)  Make an early appointment if condition worsens. 4)  Also you can see the Plastic surgeon for your stomach pain.   Prevention & Chronic Care Immunizations   Influenza vaccine: Fluvax 3+  (09/05/2009)    Tetanus booster: Not documented   Td booster deferral: Deferred  (02/27/2010)    Pneumococcal vaccine: Not documented  Other Screening   Pap smear: Not documented   Pap smear action/deferral: Deferred-2 yr interval  (09/05/2009)    Mammogram: Not documented   Mammogram action/deferral: Deferred-2 yr interval  (09/05/2009)   Smoking status: never  (06/25/2010)    Screening comments: mammo already  scheuduled for aug 5th,   Diabetes Mellitus   HgbA1C: 5.7  (04/17/2010)    Eye exam: Not documented   Diabetic eye exam action/deferral: Ophthalmology referral  (10/11/2009)    Foot exam: yes  (01/02/2009)   High risk foot: No  (01/02/2009)   Foot care education: Not documented    Urine microalbumin/creatinine ratio: 4.8  (03/21/2009)   Urine microalbumin action/deferral: Ordered    Diabetes flowsheet reviewed?: Yes   Progress toward A1C goal: At goal  Lipids   Total Cholesterol: 124  (01/02/2009)   LDL: 71  (01/02/2009)   LDL Direct: Not documented   HDL: 38  (01/02/2009)   Triglycerides: 75  (01/02/2009)    SGOT (AST): 19  (02/27/2010)   SGPT (ALT): 15   (02/27/2010)   Alkaline phosphatase: 72  (02/27/2010)   Total bilirubin: 0.3  (02/27/2010)    Lipid flowsheet reviewed?: Yes   Progress toward LDL goal: Unchanged  Hypertension   Last Blood Pressure: 131 / 88  (06/25/2010)   Serum creatinine: 0.75  (02/27/2010)   Serum potassium 4.6  (02/27/2010)    Hypertension flowsheet reviewed?: Yes   Progress toward BP goal: Improved  Self-Management Support :   Personal Goals (by the next clinic visit) :     Personal A1C goal: 6  (10/11/2009)     Personal blood pressure goal: 130/80  (10/11/2009)     Personal LDL goal: 100  (10/11/2009)    Patient will work on the following items until the next clinic visit to reach self-care goals:     Medications and monitoring: take my medicines every day, check my blood sugar  (06/25/2010)     Eating: drink diet soda or water instead of juice or soda, eat foods that are low in salt, eat baked foods instead of fried foods  (06/25/2010)     Activity: join a walking program  (06/25/2010)    Diabetes self-management support: Pre-printed educational material, Resources for patients handout  (06/25/2010)    Hypertension self-management support: Pre-printed educational material, Resources for patients handout  (06/25/2010)    Lipid self-management support: Pre-printed educational material, Resources for patients handout  (06/25/2010)        Resource handout printed.

## 2010-12-31 NOTE — Progress Notes (Signed)
Summary: refill/gg  Phone Note Refill Request  on February 07, 2010 12:33 PM  Refills Requested: Medication #1:  MOBIC 7.5 MG TABS Take 1-2  tablets by mouth once a day.   Last Refilled: 11/09/2009  Method Requested: Electronic Initial call taken by: Merrie Roof RN,  February 07, 2010 12:34 PM    Prescriptions: MOBIC 7.5 MG TABS (MELOXICAM) Take 1-2  tablets by mouth once a day  #60 x 0   Entered and Authorized by:   Laren Everts MD   Signed by:   Laren Everts MD on 02/07/2010   Method used:   Electronically to        [facility] [address] [phone]* (retail)       [address]       [place], [place]  [address]       Ph: [phone] or [phone]       Fax: [phone]   RxID:   [id number]

## 2010-12-31 NOTE — Consult Note (Signed)
Summary: [facility]   Imported By: Margie Billet 09/05/2010 12:10:28  _____________________________________________________________________  External Attachment:    Type:   Image     Comment:   External Document

## 2010-12-31 NOTE — Assessment & Plan Note (Signed)
Summary: yearly f/u /cy  Medications Added LANTUS 100 UNIT/ML  SOLN (INSULIN GLARGINE) Take 50 untis morning        Primary Provider:  Laren Everts MD  CC:  sob.  History of Present Illness: [name] [name] is a pleasant female with long history of atypical chest pain, sarcoidosis, diabetes, and previously diagnosed long QT.  Since I last saw her, she is doing reasonably well.  Her last Myoview was performed on Apr 04, 2008.  At that time, she was found to have an ejection fraction of 55% with normal perfusion. Echocardiogram in July 2005 showed normal LV function. I last saw her in March of 2010. Since then she denies significant dyspnea on exertion, orthopnea, PND, palpitations, syncope or chest pain. She patient has mild pedal edema.  Current Medications (verified): 1)  Lipitor 20 Mg Tabs (Atorvastatin Calcium) .... Take 1 Tablet By Mouth Once A Day 2)  Metformin Hcl 500 Mg Tabs (Metformin Hcl) .... Take 1 Tablet By Mouth Two Times A Day 3)  Lantus 100 Unit/ml  Soln (Insulin Glargine) .... Take 50 Untis Morning 4)  Ferrous Sulfate 325 (65 Fe) Mg  Tbec (Ferrous Sulfate) .... Take 1 Tablet By Mouth Three Times A Day 5)  Methotrexate 2.5 Mg  Tabs (Methotrexate Sodium) .... 4 Tablets Every Thursday 6)  Furosemide 40 Mg  Tabs (Furosemide) .... Take 1 Tablet By Mouth Once Daily 7)  Folic Acid 1 Mg  Tabs (Folic Acid) .... Take 1 Tablet By Mouth Once Daily 8)  Amlodipine Besylate 5 Mg  Tabs (Amlodipine Besylate) .... Take 1 Tablet By Mouth Once Daily 9)  Adult Aspirin Ec Low Strength 81 Mg  Tbec (Aspirin) .... Take 1 Tablet By Mouth Once A Day 10)  Nexium 40 Mg Cpdr (Esomeprazole Magnesium) .... One Tablet By Mouth Daily 11)  Ambien 5 Mg Tabs (Zolpidem Tartrate) .... One Tablet By Mouth At Night Before Bed 12)  Advair Diskus 100-50 Mcg/dose Aepb (Fluticasone-Salmeterol) .... 2 Puffs Twice Daily. 13)  Mobic 7.5 Mg Tabs (Meloxicam) .... Take 1-2  Tablets By Mouth Once A Day 14)  Calcium  500/d 500-400 Mg-Unit  Chew (Calcium-Vitamin D) .... Take 1 Tablet By Mouth Two Times A Day 15)  Prodigy Autocode Blood Glucose  Devi (Blood Glucose Monitoring Suppl) .... One Device 16)  Prodigy Twist Top Lancets 28g  Misc (Lancets) .... Use To Check Blood Sugar  Times A Day 17)  Prodigy Insulin Syringe 28g X 1/2" 1 Ml Misc (Insulin Syringe-Needle U-100) .... Use To Inject Insulin Once Daily 18)  Prodigy Autocode Blood Glucose  Strp (Glucose Blood) .... Use To Check Blood Sugar 3x Daily 19)  Prodigy Autocode Blood Glucose  Devi (Blood Glucose Monitoring Suppl) .... Use To Check Blood Sugar 3x A Day 20)  Prodigy Pocket Blood Glucose W/device Kit (Blood Glucose Monitoring Suppl) .... Test Once A Day  Allergies: No Known Drug Allergies  Past History:  Past Medical History: Reviewed history from 01/28/2010 and no changes required. Sarcoidosis with brain involvement Obstructive sleep apnea Prolonged QT Osteoporosis Iron deficiency anemia gastroesophageal reflux disease Diabetes mellitus type II Peripheral neuropathy Hypertension Hx of nephrolithiasis Chronic fatigue  kidney  stone  Past Surgical History: Reviewed history from 01/28/2010 and no changes required.  Hysteroscopy, dilation and curettage, Novasure  ablation.    Cystoscopy, ureteroscopy with stone extraction, left retrograde pyelogram.  Social History: Reviewed history from 01/28/2010 and no changes required.  Lives in [place] with 2 sons and her husband;   disabled;  no use of tobacco products nor alcohol.   Review of Systems       Some abdominal pain that no fevers or chills, productive cough, hemoptysis, dysphasia, odynophagia, melena, hematochezia, dysuria, hematuria, rash, seizure activity, orthopnea, PND,  claudication. Remaining systems are negative.   Vital Signs:  Patient profile:   45 year old female Height:      68 inches Weight:      236 pounds BMI:     36.01 Pulse rate:   97 / minute Resp:      14 per minute BP sitting:   139 / 83  (left arm)  Vitals Entered By: Kem Parkinson (March 26, 2010 3:27 PM)  Physical Exam  General:  Well-developed well-nourished in no acute distress.  Skin is warm and dry.  HEENT is normal.  Neck is supple. No thyromegaly.  Chest is clear to auscultation with normal expansion.  Cardiovascular exam is regular rate and rhythm.  Abdominal exam nontender or distended. No masses palpated. Extremities show trace edema. neuro grossly intact    EKG  Procedure date:  03/26/2010  Findings:      Sinus rhythm at a rate of 97. Axis normal. Nonspecific ST changes.  Impression & Recommendations:  Problem # 1:  CHEST PAIN UNSPECIFIED (ICD-786.50) No recent symptoms. Most recent Myoview negative. No further ischemia evaluation. Her updated medication list for this problem includes:    Amlodipine Besylate 5 Mg Tabs (Amlodipine besylate) .Marland Kitchen... Take 1 tablet by mouth once daily    Adult Aspirin Ec Low Strength 81 Mg Tbec (Aspirin) .Marland Kitchen... Take 1 tablet by mouth once a day  Orders: EKG w/ Interpretation (93000)  Problem # 2:  HYPERLIPIDEMIA (ICD-272.4) Continue statin. Lipids and liver monitored by primary care. Her updated medication list for this problem includes:    Lipitor 20 Mg Tabs (Atorvastatin calcium) .Marland Kitchen... Take 1 tablet by mouth once a day  Problem # 3:  DIABETES MELLITUS, TYPE II (ICD-250.00) Management per primary care. Her updated medication list for this problem includes:    Metformin Hcl 500 Mg Tabs (Metformin hcl) .Marland Kitchen... Take 1 tablet by mouth two times a day    Lantus 100 Unit/ml Soln (Insulin glargine) .Marland Kitchen... Take 50 untis morning    Adult Aspirin Ec Low Strength 81 Mg Tbec (Aspirin) .Marland Kitchen... Take 1 tablet by mouth once a day  Problem # 4:  HYPERTENSION (ICD-401.9) Blood pressure controlled on present medications. Will continue. Renal function and potassium monitored by primary care. Her updated medication list for this problem  includes:    Furosemide 40 Mg Tabs (Furosemide) .Marland Kitchen... Take 1 tablet by mouth once daily    Amlodipine Besylate 5 Mg Tabs (Amlodipine besylate) .Marland Kitchen... Take 1 tablet by mouth once daily    Adult Aspirin Ec Low Strength 81 Mg Tbec (Aspirin) .Marland Kitchen... Take 1 tablet by mouth once a day  Problem # 5:  SARCOIDOSIS (ICD-135)  Problem # 6:  OBSTRUCTIVE SLEEP APNEA (ICD-327.23)  Problem # 7:  GERD (ICD-530.81)  Her updated medication list for this problem includes:    Nexium 40 Mg Cpdr (Esomeprazole magnesium) ..... One tablet by mouth daily  Problem # 8:  LONG QT SYNDROME (ICD-794.31) QT is normal. No history of syncope. Her updated medication list for this problem includes:    Amlodipine Besylate 5 Mg Tabs (Amlodipine besylate) .Marland Kitchen... Take 1 tablet by mouth once daily    Adult Aspirin Ec Low Strength 81 Mg Tbec (Aspirin) .Marland Kitchen... Take 1 tablet by mouth once a day  Patient Instructions: 1)  Your physician recommends that you schedule a follow-up appointment in: 12 months.

## 2010-12-31 NOTE — Progress Notes (Signed)
  Phone Note Other Incoming   Caller: ED physician Reason for Call: Confirm/change Appt Summary of Call: Patient seen in the Ed today secondary to increased neck swelling and discomfort. Patient in the ED had an CT of her neck, which demonstrated a left thyroid nodule enlarged lymph node (please see CT scan results from 10/17/10, for further details); patient need to be seen ASAP and will required thyroid function studies, Uptake scan and biopsy. Please arrange followup, if possible for next week and call patient with appoinment details....       Appended Document:  called pt and scheduled for mon 11/21

## 2010-12-31 NOTE — Assessment & Plan Note (Signed)
Summary: EST-3 MONTH F/U VISIT/CH   Vital Signs:  Patient profile:   45 year old female Height:      68 inches (172.72 cm) Weight:      206.02 pounds (93.65 kg) BMI:     31.44 Temp:     98 degrees F (36.67 degrees C) oral Pulse rate:   87 / minute BP sitting:   145 / 86  (right arm)  Vitals Entered By: Angelina Ok RN (September 26, 2010 10:19 AM) Is Patient Diabetic? Yes Did you bring your meter with you today? No Pain Assessment Patient in pain? yes     Location: head Intensity: 4 Type: aching Onset of pain  Constant Nutritional Status BMI of > 30 = obese  Have you ever been in a relationship where you felt threatened, hurt or afraid?No   Does patient need assistance? Functional Status Self care Ambulation Normal Comments Refills on Nexium and Insulin.  Check up.   Primary Care Provider:  Laren Everts MD   History of Present Illness: 45 yr old woman with pmhx as described below comes to the clinic for regular check up.   Abdominal Pain: Patient reports that she was found to have a large uterine fibroid recently. Procedure going to be scheduled soon.  Diabetes: Reports to be taking her medication as directed. Patient has lost 30 lbs.   Migraines: Headaches have actually been causing insomnia recently. She takes vicodin for pain which relieves the pain.   GERD: Well controlled on current regimen.  Depression History:      The patient denies a depressed mood most of the day and a diminished interest in her usual daily activities.         Preventive Screening-Counseling & Management  Alcohol-Tobacco     Smoking Status: never  Problems Prior to Update: 1)  Upper Respiratory Infection  (ICD-465.9) 2)  Abdominal Pain, Chronic  (ICD-789.00) 3)  Chest Pain Unspecified  (ICD-786.50) 4)  Hyperlipidemia  (ICD-272.4) 5)  Hypercholesterolemia  (ICD-272.0) 6)  Diabetes Mellitus, Type II  (ICD-250.00) 7)  Hypertension  (ICD-401.9) 8)  Sarcoidosis   (ICD-135) 9)  Fatigue, Chronic  (ICD-780.79) 10)  Obstructive Sleep Apnea  (ICD-327.23) 11)  Gerd  (ICD-530.81) 12)  Hip Pain, Right, Chronic  (ICD-719.45) 13)  Constipation Nos  (ICD-564.00) 14)  Nephrolithiasis, Hx of  (ICD-V13.01) 15)  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 Take 1 Tablet By Mouth Once A Day 2)  Metformin Hcl 500 Mg Tabs (Metformin Hcl) .... Take 1 Tablet By Mouth Two Times A Day 3)  Lantus 100 Unit/ml  Soln (Insulin Glargine) .... Take 35 Untis Morning 4)  Ferrous Sulfate 325 (65 Fe) Mg  Tbec (Ferrous Sulfate) .... Take 1 Tablet By Mouth Three Times A Day 5)  Methotrexate 2.5 Mg  Tabs (Methotrexate Sodium) .... 4 Tablets Every Thursday 6)  Furosemide 40 Mg  Tabs (Furosemide) .... Take 1 Tablet By Mouth Once Daily 7)  Folic Acid 1 Mg  Tabs (Folic Acid) .... Take 1 Tablet By Mouth Once Daily 8)  Amlodipine Besylate 5 Mg  Tabs (Amlodipine Besylate) .... Take 1 Tablet By Mouth Once Daily 9)  Adult Aspirin Ec Low Strength 81 Mg  Tbec (Aspirin) .... Take 1 Tablet  By Mouth Once A Day 10)  Nexium 40 Mg Cpdr (Esomeprazole Magnesium) .... One Tablet By Mouth Daily 11)  Ambien 5 Mg Tabs (Zolpidem Tartrate) .... One Tablet By Mouth At Night Before Bed 12)  Advair Diskus 100-50 Mcg/dose Aepb (Fluticasone-Salmeterol) .... 2 Puffs Twice Daily. 13)  Mobic 7.5 Mg Tabs (Meloxicam) .... Take 1-2  Tablets By Mouth Once A Day 14)  Calcium 500/d 500-400 Mg-Unit  Chew (Calcium-Vitamin D) .... Take 1 Tablet By Mouth Two Times A Day 15)  Prodigy Autocode Blood Glucose  Devi (Blood Glucose Monitoring Suppl) .... One Device 16)  Prodigy Twist Top Lancets 28g  Misc (Lancets) .... Use To Check Blood Sugar  Times A Day 17)  Prodigy Insulin  Syringe 28g X 1/2" 1 Ml Misc (Insulin Syringe-Needle U-100) .... Use To Inject Insulin Once Daily 18)  Prodigy Autocode Blood Glucose  Strp (Glucose Blood) .... Use To Check Blood Sugar 3x Daily 19)  Prodigy Autocode Blood Glucose  Devi (Blood Glucose Monitoring Suppl) .... Use To Check Blood Sugar 3x A Day 20)  Prodigy Pocket Blood Glucose W/device Kit (Blood Glucose Monitoring Suppl) .... Test Once A Day  Current Medications (verified): 1)  Lipitor 20 Mg Tabs (Atorvastatin Calcium) .... Take 1 Tablet By Mouth Once A Day 2)  Metformin Hcl 500 Mg Tabs (Metformin Hcl) .... Take 1 Tablet By Mouth Two Times A Day 3)  Lantus 100 Unit/ml  Soln (Insulin Glargine) .... Take 35 Untis Morning 4)  Ferrous Sulfate 325 (65 Fe) Mg  Tbec (Ferrous Sulfate) .... Take 1 Tablet By Mouth Three Times A Day 5)  Methotrexate 2.5 Mg  Tabs (Methotrexate Sodium) .... 4 Tablets Every Thursday 6)  Furosemide 40 Mg  Tabs (Furosemide) .... Take 1 Tablet By Mouth Once Daily 7)  Folic Acid 1 Mg  Tabs (Folic Acid) .... Take 1 Tablet By Mouth Once Daily 8)  Amlodipine Besylate 5 Mg  Tabs (Amlodipine Besylate) .... Take 1 Tablet By Mouth Once Daily 9)  Adult Aspirin Ec Low Strength 81 Mg  Tbec (Aspirin) .... Take 1 Tablet By Mouth Once A Day 10)  Nexium 40 Mg Cpdr (Esomeprazole Magnesium) .... One Tablet By Mouth Daily 11)  Ambien 5 Mg Tabs (Zolpidem Tartrate) .... One Tablet By Mouth At Night Before Bed 12)  Advair Diskus 100-50 Mcg/dose Aepb (Fluticasone-Salmeterol) .... 2 Puffs Twice Daily. 13)  Mobic 7.5 Mg Tabs (Meloxicam) .... Take 1-2  Tablets By Mouth Once A Day 14)  Calcium 500/d 500-400 Mg-Unit  Chew (Calcium-Vitamin D) .... Take 1 Tablet By Mouth Two Times A Day 15)  Prodigy Autocode Blood Glucose  Devi (Blood Glucose Monitoring Suppl) .... One Device 16)  Prodigy Twist Top Lancets 28g  Misc (Lancets) .... Use To Check Blood Sugar  Times A Day 17)  Prodigy Insulin Syringe 28g X 1/2" 1 Ml Misc (Insulin Syringe-Needle  U-100) .... Use To Inject Insulin Once Daily 18)  Prodigy Autocode Blood Glucose  Strp (Glucose Blood) .... Use To Check Blood Sugar 3x Daily 19)  Prodigy Autocode Blood Glucose  Devi (Blood Glucose Monitoring Suppl) .... Use To Check Blood Sugar 3x A Day 20)  Prodigy Pocket Blood Glucose W/device Kit (Blood Glucose Monitoring Suppl) .... Test Once A Day  Allergies: No Known Drug Allergies  Past History:  Past Medical History: Last updated: 01/28/2010 Sarcoidosis with brain involvement Obstructive sleep apnea Prolonged QT Osteoporosis Iron deficiency anemia gastroesophageal reflux disease Diabetes mellitus type II Peripheral neuropathy Hypertension Hx of nephrolithiasis Chronic fatigue  kidney  stone  Past Surgical History: Last updated: 01/28/2010  Hysteroscopy, dilation and curettage, Novasure  ablation.    Cystoscopy, ureteroscopy with stone extraction, left retrograde pyelogram.  Family History: Last updated: 01/28/2010   Questionable coronary disease; arrhythmia in her   mother.   Social History: Last updated: 06/25/2010  Lives in [place] with 2 sons and her husband;   disabled; no use of tobacco products nor alcohol.   Educaion- Highschool  Risk Factors: Exercise: yes (04/17/2010)  Risk Factors: Smoking Status: never (09/26/2010)  Family History: Reviewed history from 01/28/2010 and no changes required.   Questionable coronary disease; arrhythmia in her   mother.   Social History: Reviewed history from 06/25/2010 and no changes required.  Lives in [place] with 2 sons and her husband;   disabled; no use of tobacco products nor alcohol.   Educaion- Highschool  Review of Systems  The patient denies fever, chest pain, dyspnea on exertion, hemoptysis, melena, hematochezia, hematuria, muscle weakness, and difficulty walking.    Physical Exam  General:  alert , well-developed and well-nourished.   Mouth:  MMM Neck:  supple  Lungs:  normal  respiratory effort and normal breath sounds.   Heart:  normal rate and regular rhythm.   Abdomen:  soft and normal bowel sounds.   Msk:  normal ROM, no joint swelling, no joint warmth, no redness over joints, no joint deformities, and no joint instability.   Extremities:  no cyanosis, clubbing or edema  Neurologic:  Nonfocal  Diabetes Management Exam:    Foot Exam (with socks and/or shoes not present):       Sensory-Monofilament:          Left foot: normal          Right foot: normal   Impression & Recommendations:  Problem # 1:  HYPERTENSION (ICD-401.9) Not at goal. Will increase Norvasc and follow up.  Her updated medication list for this problem includes:    Furosemide 40 Mg Tabs (Furosemide) .Marland Kitchen... Take 1 tablet by mouth once daily    Amlodipine Besylate 5 Mg Tabs (Amlodipine besylate) .Marland Kitchen... Take 2 tablets by mouth once daily  BP today: 145/86 Prior BP: 131/88 (06/25/2010)  Labs Reviewed: K+: 4.6 (02/27/2010) Creat: : 0.75 (02/27/2010)   Chol: 124 (01/02/2009)   HDL: 38 (01/02/2009)   LDL: 71 (01/02/2009)   TG: 75 (01/02/2009)  Problem # 2:  DIABETES MELLITUS, TYPE II (ICD-250.00) Good control. Continue current regimen. Foot exam done today. Diabetic Eye Exam ordered today. If HgA1c increasing on follow up will consider increaseing Metformin.  Her updated medication list for this problem includes:    Metformin Hcl 500 Mg Tabs (Metformin hcl) .Marland Kitchen... Take 1 tablet by mouth two times a day    Lantus 100 Unit/ml Soln (Insulin glargine) .Marland Kitchen... Take 35 untis morning    Adult Aspirin Ec Low Strength 81 Mg Tbec (Aspirin) .Marland Kitchen... Take 1 tablet by mouth once a day  Orders: T- Capillary Blood Glucose (60109) T-Hgb A1C (in-house) (32355DD) Ophthalmology Referral (Ophthalmology)  Labs Reviewed: Creat: 0.75 (02/27/2010)    Reviewed HgBA1c results: 7.3 (09/26/2010)  5.7 (04/17/2010)  Problem # 3:  HYPERCHOLESTEROLEMIA (ICD-272.0) Recheck FLP, cmet today and reassess.  Her updated  medication list for this problem includes:    Lipitor 20 Mg Tabs (Atorvastatin calcium) .Marland Kitchen... Take 1 tablet by mouth once a day    Labs Reviewed: SGOT: 19 (02/27/2010)   SGPT: 15 (02/27/2010)   HDL:38 (01/02/2009), 40 (08/24/2007)  LDL:71 (01/02/2009), 77 (08/24/2007)  Chol:124 (01/02/2009), 130 (08/24/2007)  Trig:75 (01/02/2009), 64 (08/24/2007)  Orders: T-Lipid Profile ([id number])  Problem # 4:  ABDOMINAL PAIN, CHRONIC (ICD-789.00) Patient was found to have a large uterine fibroid recently per MRI. Patient saw Gyn 2 days ago. The plan is to embolize fibroid. Procedure is going to be scheduled in the next couple of weeks. Will follow up.  Problem # 5:  Preventive Health Care (ICD-V70.0) Recieved flut shot today.   Complete Medication List: 1)  Lipitor 20 Mg Tabs (Atorvastatin calcium) .... Take 1 tablet by mouth once a day 2)  Metformin Hcl 500 Mg Tabs (Metformin hcl) .... Take 1 tablet by mouth two times a day 3)  Lantus 100 Unit/ml Soln (Insulin glargine) .... Take 35 untis morning 4)  Ferrous Sulfate 325 (65 Fe) Mg Tbec (Ferrous sulfate) .... Take 1 tablet by mouth three times a day 5)  Methotrexate 2.5 Mg Tabs (Methotrexate sodium) .... 4 tablets every thursday 6)  Furosemide 40 Mg Tabs (Furosemide) .... Take 1 tablet by mouth once daily 7)  Folic Acid 1 Mg Tabs (Folic acid) .... Take 1 tablet by mouth once daily 8)  Amlodipine Besylate 5 Mg Tabs (Amlodipine besylate) .... Take 2 tablets by mouth once daily 9)  Adult Aspirin Ec Low Strength 81 Mg Tbec (Aspirin) .... Take 1 tablet by mouth once a day 10)  Nexium 40 Mg Cpdr (Esomeprazole magnesium) .... One tablet by mouth daily 11)  Ambien 5 Mg Tabs (Zolpidem tartrate) .... One tablet by mouth at night before bed 12)  Advair Diskus 100-50 Mcg/dose Aepb (Fluticasone-salmeterol) .... 2 puffs twice daily. 13)  Mobic 7.5 Mg Tabs (Meloxicam) .... Take 1-2  tablets by mouth once a day 14)  Calcium 500/d 500-400 Mg-unit Chew  (Calcium-vitamin d) .... Take 1 tablet by mouth two times a day 15)  Prodigy Autocode Blood Glucose Devi (Blood glucose monitoring suppl) .... One device 16)  Prodigy Twist Top Lancets 28g Misc (Lancets) .... Use to check blood sugar  times a day 17)  Prodigy Insulin Syringe 28g X 1/2" 1 Ml Misc (Insulin syringe-needle u-100) .... Use to inject insulin once daily 18)  Prodigy Autocode Blood Glucose Strp (Glucose blood) .... Use to check blood sugar 3x daily 19)  Prodigy Autocode Blood Glucose Devi (Blood glucose monitoring suppl) .... Use to check blood sugar 3x a day 20)  Prodigy Pocket Blood Glucose W/device Kit (Blood glucose monitoring suppl) .... Test once a day  Other Orders: Influenza Vaccine NON MCR (95638) T-CMP with Estimated GFR ([id number])  Patient Instructions: 1)  Please schedule a follow-up appointment in 3 months. 2)  Remember to increase Norvasc to two tablets daily. 3)  Take all medication as directed. 4)  You will be called with any abnormalities in the tests scheduled or performed today.  If you don't hear from [name] within a week from when the test was performed, you can assume that your test was normal.  Prescriptions: AMBIEN 5 MG TABS (ZOLPIDEM TARTRATE) one tablet by mouth at night before bed  #30 x 0   Entered and Authorized by:   Laren Everts MD   Signed by:   Laren Everts MD on 09/26/2010   Method used:   Print then Give to Patient   RxID:   [id number]    Orders Added: 1)  T- Capillary Blood Glucose [82948] 2)  T-Hgb A1C (in-house) [93235TD] 3)  Ophthalmology Referral [Ophthalmology] 4)  Influenza Vaccine NON MCR [00028] 5)  T-Lipid Profile [80061-22930] 6)  T-CMP with Estimated GFR [[address]] 7)  Est. Patient Level IV [16109]   Immunizations Administered:  Influenza Vaccine # 1:    Vaccine Type: Fluvax Non-MCR    Site: right deltoid    Mfr: GlaxoSmithKline    Dose: 0.5 ml    Route: IM    Given by: Angelina Ok RN     Exp. Date: 05/31/2011    Lot #: [id number]    VIS given: 06/25/10 version given September 26, 2010.  Flu Vaccine Consent Questions:    Do you have a history of severe allergic reactions to this vaccine? no    Any prior history of allergic reactions to egg and/or gelatin? no    Do you have a sensitivity to the preservative Thimersol? no    Do you have a past history of Guillan-Barre Syndrome? no    Do you currently have an acute febrile illness? no    Have you ever had a severe reaction to latex? no    Vaccine information given and explained to patient? yes    Are you currently pregnant? no   Immunizations Administered:  Influenza Vaccine # 1:    Vaccine Type: Fluvax Non-MCR    Site: right deltoid    Mfr: GlaxoSmithKline    Dose: 0.5 ml    Route: IM    Given by: Angelina Ok RN    Exp. Date: 05/31/2011    Lot #: [id number]    VIS given: 06/25/10 version given September 26, 2010.   Vital Signs:  Patient profile:   45 year old female Height:      68 inches (172.72 cm) Weight:      206.02 pounds (93.65 kg) BMI:     31.44 Temp:     98 degrees F (36.67 degrees C) oral Pulse rate:   87 / minute BP sitting:   145 / 86  (right arm)  Vitals Entered By: Angelina Ok RN (September 26, 2010 10:19 AM)   Prevention & Chronic Care Immunizations   Influenza vaccine: Fluvax Non-MCR  (09/26/2010)    Tetanus booster: Not documented   Td booster deferral: Deferred  (02/27/2010)    Pneumococcal vaccine: Not documented  Other Screening   Pap smear: Not documented   Pap smear action/deferral: Deferred-2 yr interval  (09/05/2009)    Mammogram: Not documented   Mammogram action/deferral: Deferred-2 yr interval  (09/05/2009)   Smoking status: never  (09/26/2010)  Diabetes Mellitus   HgbA1C: 7.3  (09/26/2010)    Eye exam: Not documented   Diabetic eye exam action/deferral: Ophthalmology referral  (09/26/2010)    Foot exam: yes  (09/26/2010)   High risk foot: No  (09/26/2010)   Foot  care education: Done  (09/26/2010)    Urine microalbumin/creatinine ratio: 4.8  (03/21/2009)   Urine microalbumin action/deferral: Ordered    Diabetes flowsheet reviewed?: Yes   Progress toward A1C goal: Deteriorated  Lipids   Total Cholesterol: 124  (01/02/2009)   Lipid panel action/deferral: Lipid Panel ordered   LDL: 71  (01/02/2009)   LDL Direct: Not documented   HDL: 38  (01/02/2009)   Triglycerides: 75  (01/02/2009)    SGOT (AST): 19  (02/27/2010)   SGPT (ALT): 15  (02/27/2010)   Alkaline phosphatase: 72  (02/27/2010)   Total bilirubin: 0.3  (02/27/2010)    Lipid flowsheet reviewed?: Yes   Progress toward LDL goal: At goal  Hypertension   Last Blood Pressure: 145 / 86  (09/26/2010)   Serum creatinine: 0.75  (02/27/2010)  Serum potassium 4.6  (02/27/2010)    Hypertension flowsheet reviewed?: Yes   Progress toward BP goal: Deteriorated  Self-Management Support :   Personal Goals (by the next clinic visit) :     Personal A1C goal: 6  (10/11/2009)     Personal blood pressure goal: 130/80  (10/11/2009)     Personal LDL goal: 100  (10/11/2009)    Patient will work on the following items until the next clinic visit to reach self-care goals:     Medications and monitoring: take my medicines every day, check my blood sugar, bring all of my medications to every visit, weigh myself weekly  (09/26/2010)     Eating: drink diet soda or water instead of juice or soda, eat more vegetables, use fresh or frozen vegetables, eat foods that are low in salt, eat baked foods instead of fried foods, eat fruit for snacks and desserts, limit or avoid alcohol  (09/26/2010)     Activity: take a 30 minute walk every day  (09/26/2010)    Diabetes self-management support: Written self-care plan, Education handout, Pre-printed educational material, Resources for patients handout  (09/26/2010)   Diabetes care plan printed   Diabetes education handout printed    Hypertension self-management  support: Written self-care plan, Education handout, Pre-printed educational material, Resources for patients handout  (09/26/2010)   Hypertension self-care plan printed.   Hypertension education handout printed    Lipid self-management support: Written self-care plan, Education handout, Pre-printed educational material, Resources for patients handout  (09/26/2010)   Lipid self-care plan printed.   Lipid education handout printed      Resource handout printed.   Nursing Instructions: Give Flu vaccine today Refer for screening diabetic eye exam (see order)     Last LDL:                                                 71 (01/02/2009 8:17:00 PM)          Diabetic Foot Exam Last Podiatry Exam Date: 04/14/2008 Foot Inspection Is there a history of a foot ulcer?              No Is there a foot ulcer now?              No Can the patient see the bottom of their feet?          Yes Are the shoes appropriate in style and fit?          Yes Is there swelling or an abnormal foot shape?          Yes Are the toenails long?                No Are the toenails thick?                No Are the toenails ingrown?              No Is there heavy callous build-up?              No Is there a claw toe deformity?                          No Is there elevated skin temperature?            No Is there limited ankle  dorsiflexion?            No Is there foot or ankle muscle weakness?            No Do you have pain in calf while walking?           No      Diabetic Foot Care Education :Patient educated on appropriate care of diabetic feet.  Pulse Check          Right Foot          Left Foot Posterior Tibial:        3+            3+ Dorsalis Pedis:        3+            3+ Comments: Small amount of callus build up ball of left foot. Small toe left foot small hardened are. High Risk Feet? No   10-g (5.07) Semmes-Weinstein Monofilament Test Performed by: Angelina Ok RN          Right Foot           Left Foot Visual Inspection               Test Control      normal         normal Site 1         normal         normal Site 2         normal         normal Site 3         normal         normal Site 4         normal         normal Site 5         normal         normal Site 6         normal         normal Site 7         normal         normal Site 8         normal         normal Site 9         normal         normal Site 10         normal         normal  Impression      normal         normal      Laboratory Results   Blood Tests   Date/Time Received: September 26, 2010 10:38 AM Date/Time Reported: Burke Keels  September 26, 2010 10:38 AM   HGBA1C: 7.3%   (Normal Range: Non-Diabetic - 3-6%   Control Diabetic - 6-8%) CBG Fasting:: 205mg /dL     Process Orders Check Orders Results:     [facility]: ABN not required for this insurance Tests Sent for requisitioning (September 26, 2010 11:26 AM):     09/26/2010: [facility] -- T-Lipid Profile [phone] (signed)     09/26/2010: [facility] -- T-CMP with Estimated GFR [[id number]] (signed)

## 2011-01-02 NOTE — Assessment & Plan Note (Signed)
Summary: EST-3 MONTH CHECKUP/CH   Vital Signs:  Patient profile:   45 year old female Height:      67 inches (170.18 cm) Weight:      199.06 pounds (90.48 kg) BMI:     31.29 Temp:     97.5 degrees F (36.39 degrees C) oral Pulse rate:   96 / minute BP sitting:   137 / 76  (right arm)  Vitals Entered By: Angelina Ok RN (December 05, 2010 1:26 PM) Is Patient Diabetic? Yes Did you bring your meter with you today? No Pain Assessment Patient in pain? no      Nutritional Status BMI of > 30 = obese CBG Result 264  Have you ever been in a relationship where you felt threatened, hurt or afraid?No   Does patient need assistance? Functional Status Self care Ambulation Normal Comments THyroid surgery on Monday.  Check up.   Primary Care Provider:  Laren Everts MD   History of Present Illness: 45 yr old woman with pmhx as described comes to the clinic for follow up of thyroid mass. Patient reports that she will get thryoid mass removed on monday of next week. Patient has no complains.   HTN: Patient taking medication as directed.  Depression History:      The patient denies a depressed mood most of the day and a diminished interest in her usual daily activities.         Preventive Screening-Counseling & Management  Alcohol-Tobacco     Smoking Status: never  Problems Prior to Update: 1)  Fibroids, Uterus  (ICD-218.9) 2)  Thyroid Mass  (ICD-240.9) 3)  Upper Respiratory Infection  (ICD-465.9) 4)  Abdominal Pain, Chronic  (ICD-789.00) 5)  Chest Pain Unspecified  (ICD-786.50) 6)  Hyperlipidemia  (ICD-272.4) 7)  Hypercholesterolemia  (ICD-272.0) 8)  Diabetes Mellitus, Type II  (ICD-250.00) 9)  Hypertension  (ICD-401.9) 10)  Sarcoidosis  (ICD-135) 11)  Fatigue, Chronic  (ICD-780.79) 12)  Obstructive Sleep Apnea  (ICD-327.23) 13)  Gerd  (ICD-530.81) 14)  Hip Pain, Right, Chronic  (ICD-719.45) 15)  Constipation Nos  (ICD-564.00) 16)  Nephrolithiasis, Hx of   (ICD-V13.01) 17)  Long Qt Syndrome  (ICD-794.31) 18)  Peroneal Neuropathy  (ICD-956.3) 19)  Osteoporosis  (ICD-733.00) 20)  Anemia, Iron Deficiency Nos  (ICD-280.9) 21)  Hx of Contact With or Exposure To Venereal Diseases  (ICD-V01.6)  Medications Prior to Update: 1)  Lipitor 20 Mg Tabs (Atorvastatin Calcium) .... Take 1 Tablet By Mouth Once A Day 2)  Metformin Hcl 500 Mg Tabs (Metformin Hcl) .... Take 1 Tablet By Mouth Two Times A Day 3)  Lantus 100 Unit/ml  Soln (Insulin Glargine) .... Take 35 Untis Morning 4)  Ferrous Sulfate 325 (65 Fe) Mg  Tbec (Ferrous Sulfate) .... Take 1 Tablet By Mouth Three Times A Day 5)  Methotrexate 2.5 Mg  Tabs (Methotrexate Sodium) .... 4 Tablets Every Thursday 6)  Furosemide 40 Mg  Tabs (Furosemide) .... Take 1 Tablet By Mouth Once Daily 7)  Folic Acid 1 Mg  Tabs (Folic Acid) .... Take 1 Tablet By Mouth Once Daily 8)  Amlodipine Besylate 5 Mg  Tabs (Amlodipine Besylate) .... Take 2 Tablets By Mouth Once Daily 9)  Adult Aspirin Ec Low Strength 81 Mg  Tbec (Aspirin) .... Take 1 Tablet By Mouth Once A Day 10)  Nexium 40 Mg Cpdr (Esomeprazole Magnesium) .... One Tablet By Mouth Daily 11)  Ambien 5 Mg Tabs (Zolpidem Tartrate) .... One Tablet By Mouth At Night  Before Bed 12)  Advair Diskus 100-50 Mcg/dose Aepb (Fluticasone-Salmeterol) .... 2 Puffs Twice Daily. 13)  Mobic 7.5 Mg Tabs (Meloxicam) .... Take 1-2  Tablets By Mouth Once A Day 14)  Calcium 500/d 500-400 Mg-Unit  Chew (Calcium-Vitamin D) .... Take 1 Tablet By Mouth Two Times A Day 15)  Prodigy Autocode Blood Glucose  Devi (Blood Glucose Monitoring Suppl) .... One Device 16)  Prodigy Twist Top Lancets 28g  Misc (Lancets) .... Use To Check Blood Sugar  Times A Day 17)  Prodigy Insulin Syringe 28g X 1/2" 1 Ml Misc (Insulin Syringe-Needle U-100) .... Use To Inject Insulin Once Daily 18)  Prodigy Autocode Blood Glucose  Strp (Glucose Blood) .... Use To Check Blood Sugar 3x Daily 19)  Prodigy Autocode Blood  Glucose  Devi (Blood Glucose Monitoring Suppl) .... Use To Check Blood Sugar 3x A Day 20)  Prodigy Pocket Blood Glucose W/device Kit (Blood Glucose Monitoring Suppl) .... Test Once A Day 21)  Percocet 10-650 Mg Tabs (Oxycodone-Acetaminophen) .... Take 1 Tablet By Mouth Three Times A Day As Needed  Current Medications (verified): 1)  Lipitor 20 Mg Tabs (Atorvastatin Calcium) .... Take 1 Tablet By Mouth Once A Day 2)  Metformin Hcl 500 Mg Tabs (Metformin Hcl) .... Take 1 Tablet By Mouth Two Times A Day 3)  Lantus 100 Unit/ml  Soln (Insulin Glargine) .... Take 35 Untis Morning 4)  Ferrous Sulfate 325 (65 Fe) Mg  Tbec (Ferrous Sulfate) .... Take 1 Tablet By Mouth Three Times A Day 5)  Methotrexate 2.5 Mg  Tabs (Methotrexate Sodium) .... 4 Tablets Every Thursday 6)  Furosemide 40 Mg  Tabs (Furosemide) .... Take 1 Tablet By Mouth Once Daily 7)  Folic Acid 1 Mg  Tabs (Folic Acid) .... Take 1 Tablet By Mouth Once Daily 8)  Amlodipine Besylate 5 Mg  Tabs (Amlodipine Besylate) .... Take 2 Tablets By Mouth Once Daily 9)  Adult Aspirin Ec Low Strength 81 Mg  Tbec (Aspirin) .... Take 1 Tablet By Mouth Once A Day 10)  Nexium 40 Mg Cpdr (Esomeprazole Magnesium) .... One Tablet By Mouth Daily 11)  Ambien 5 Mg Tabs (Zolpidem Tartrate) .... One Tablet By Mouth At Night Before Bed 12)  Advair Diskus 100-50 Mcg/dose Aepb (Fluticasone-Salmeterol) .... 2 Puffs Twice Daily. 13)  Mobic 7.5 Mg Tabs (Meloxicam) .... Take 1-2  Tablets By Mouth Once A Day 14)  Calcium 500/d 500-400 Mg-Unit  Chew (Calcium-Vitamin D) .... Take 1 Tablet By Mouth Two Times A Day 15)  Prodigy Autocode Blood Glucose  Devi (Blood Glucose Monitoring Suppl) .... One Device 16)  Prodigy Twist Top Lancets 28g  Misc (Lancets) .... Use To Check Blood Sugar  Times A Day 17)  Prodigy Insulin Syringe 28g X 1/2" 1 Ml Misc (Insulin Syringe-Needle U-100) .... Use To Inject Insulin Once Daily 18)  Prodigy Autocode Blood Glucose  Strp (Glucose Blood) .... Use  To Check Blood Sugar 3x Daily 19)  Prodigy Autocode Blood Glucose  Devi (Blood Glucose Monitoring Suppl) .... Use To Check Blood Sugar 3x A Day 20)  Prodigy Pocket Blood Glucose W/device Kit (Blood Glucose Monitoring Suppl) .... Test Once A Day 21)  Percocet 10-650 Mg Tabs (Oxycodone-Acetaminophen) .... Take 1 Tablet By Mouth Three Times A Day As Needed  Allergies: No Known Drug Allergies  Past History:  Past Medical History: Last updated: 01/28/2010 Sarcoidosis with brain involvement Obstructive sleep apnea Prolonged QT Osteoporosis Iron deficiency anemia gastroesophageal reflux disease Diabetes mellitus type II Peripheral neuropathy Hypertension Hx  of nephrolithiasis Chronic fatigue  kidney  stone  Past Surgical History: Last updated: 01/28/2010  Hysteroscopy, dilation and curettage, Novasure  ablation.    Cystoscopy, ureteroscopy with stone extraction, left retrograde pyelogram.  Family History: Last updated: 01/28/2010   Questionable coronary disease; arrhythmia in her   mother.   Social History: Last updated: 06/25/2010  Lives in [place] with 2 sons and her husband;   disabled; no use of tobacco products nor alcohol.   Educaion- Highschool  Risk Factors: Exercise: yes (10/21/2010)  Risk Factors: Smoking Status: never (12/05/2010)  Family History: Reviewed history from 01/28/2010 and no changes required.   Questionable coronary disease; arrhythmia in her   mother.   Social History: Reviewed history from 06/25/2010 and no changes required.  Lives in [place] with 2 sons and her husband;   disabled; no use of tobacco products nor alcohol.   Educaion- Highschool  Review of Systems  The patient denies fever, chest pain, syncope, dyspnea on exertion, peripheral edema, prolonged cough, headaches, hemoptysis, abdominal pain, melena, hematochezia, hematuria, muscle weakness, difficulty walking, and unusual weight change.    Physical Exam  General:   alert, well-developed, well-nourished, and well-hydrated.  vitals reviewed Mouth:  pharynx pink and moist.   Neck:  supple and L neck mass, no tenderness or bruits.   Lungs:  normal respiratory effort, no accessory muscle use, normal breath sounds, no crackles, and no wheezes.   Heart:  normal rate, regular rhythm, no murmur, and no JVD.   Abdomen:  soft, normal bowel sounds, no distention, no masses, and RLQ tenderness.   Msk:  normal ROM, no joint tenderness, no joint swelling, and no joint warmth.   Neurologic:  alert & oriented X3 and gait normal.     Impression & Recommendations:  Problem # 1:  THYROID MASS (ICD-240.9) Patient will receive surgery next week. Instructed patient to return in two weeks. At that time go over pathology results with patient and discuss thyroid hormone replacement.  Problem # 2:  HYPERTENSION (ICD-401.9) At goal. Continue current regimen.  Her updated medication list for this problem includes:    Furosemide 40 Mg Tabs (Furosemide) .Marland Kitchen... Take 1 tablet by mouth once daily    Amlodipine Besylate 5 Mg Tabs (Amlodipine besylate) .Marland Kitchen... Take 2 tablets by mouth once daily  BP today: 137/76 Prior BP: 158/95 (11/06/2010)  Labs Reviewed: K+: 4.2 (09/26/2010) Creat: : 0.69 (09/26/2010)   Chol: 131 (09/26/2010)   HDL: 35 (09/26/2010)   LDL: 83 (09/26/2010)   TG: 63 (09/26/2010)  Complete Medication List: 1)  Lipitor 20 Mg Tabs (Atorvastatin calcium) .... Take 1 tablet by mouth once a day 2)  Metformin Hcl 500 Mg Tabs (Metformin hcl) .... Take 1 tablet by mouth two times a day 3)  Lantus 100 Unit/ml Soln (Insulin glargine) .... Take 35 untis morning 4)  Ferrous Sulfate 325 (65 Fe) Mg Tbec (Ferrous sulfate) .... Take 1 tablet by mouth three times a day 5)  Methotrexate 2.5 Mg Tabs (Methotrexate sodium) .... 4 tablets every thursday 6)  Furosemide 40 Mg Tabs (Furosemide) .... Take 1 tablet by mouth once daily 7)  Folic Acid 1 Mg Tabs (Folic acid) .... Take 1 tablet  by mouth once daily 8)  Amlodipine Besylate 5 Mg Tabs (Amlodipine besylate) .... Take 2 tablets by mouth once daily 9)  Adult Aspirin Ec Low Strength 81 Mg Tbec (Aspirin) .... Take 1 tablet by mouth once a day 10)  Nexium 40 Mg Cpdr (Esomeprazole magnesium) .... One tablet  by mouth daily 11)  Ambien 5 Mg Tabs (Zolpidem tartrate) .... One tablet by mouth at night before bed 12)  Advair Diskus 100-50 Mcg/dose Aepb (Fluticasone-salmeterol) .... 2 puffs twice daily. 13)  Mobic 7.5 Mg Tabs (Meloxicam) .... Take 1-2  tablets by mouth once a day 14)  Calcium 500/d 500-400 Mg-unit Chew (Calcium-vitamin d) .... Take 1 tablet by mouth two times a day 15)  Prodigy Autocode Blood Glucose Devi (Blood glucose monitoring suppl) .... One device 16)  Prodigy Twist Top Lancets 28g Misc (Lancets) .... Use to check blood sugar  times a day 17)  Prodigy Insulin Syringe 28g X 1/2" 1 Ml Misc (Insulin syringe-needle u-100) .... Use to inject insulin once daily 18)  Prodigy Autocode Blood Glucose Strp (Glucose blood) .... Use to check blood sugar 3x daily 19)  Prodigy Autocode Blood Glucose Devi (Blood glucose monitoring suppl) .... Use to check blood sugar 3x a day 20)  Prodigy Pocket Blood Glucose W/device Kit (Blood glucose monitoring suppl) .... Test once a day 21)  Percocet 10-650 Mg Tabs (Oxycodone-acetaminophen) .... Take 1 tablet by mouth three times a day as needed  Other Orders: Capillary Blood Glucose/CBG (54098)  Patient Instructions: 1)  Please schedule a follow-up appointment in 2 weeks. 2)  Continue to take all medications as directed.   Orders Added: 1)  Capillary Blood Glucose/CBG [82948] 2)  Est. Patient Level III [11914]    Prevention & Chronic Care Immunizations   Influenza vaccine: Fluvax Non-MCR  (09/26/2010)    Tetanus booster: Not documented   Td booster deferral: Deferred  (02/27/2010)    Pneumococcal vaccine: Not documented  Other Screening   Pap smear: Not documented   Pap  smear action/deferral: Deferred-2 yr interval  (09/05/2009)    Mammogram: Not documented   Mammogram action/deferral: Deferred-2 yr interval  (09/05/2009)   Smoking status: never  (12/05/2010)  Diabetes Mellitus   HgbA1C: 7.3  (09/26/2010)    Eye exam: Neg for diabetic retinopathy  (10/04/2010)   Diabetic eye exam action/deferral: Ophthalmology referral  (09/26/2010)    Foot exam: yes  (09/26/2010)   High risk foot: No  (09/26/2010)   Foot care education: Done  (09/26/2010)    Urine microalbumin/creatinine ratio: 4.5  (10/21/2010)   Urine microalbumin action/deferral: Ordered  Lipids   Total Cholesterol: 131  (09/26/2010)   Lipid panel action/deferral: Lipid Panel ordered   LDL: 83  (09/26/2010)   LDL Direct: Not documented   HDL: 35  (09/26/2010)   Triglycerides: 63  (09/26/2010)    SGOT (AST): 13  (09/26/2010)   SGPT (ALT): 15  (09/26/2010)   Alkaline phosphatase: 66  (09/26/2010)   Total bilirubin: 0.3  (09/26/2010)  Hypertension   Last Blood Pressure: 137 / 76  (12/05/2010)   Serum creatinine: 0.69  (09/26/2010)   Serum potassium 4.2  (09/26/2010)  Self-Management Support :   Personal Goals (by the next clinic visit) :     Personal A1C goal: 6  (10/11/2009)     Personal blood pressure goal: 130/80  (10/11/2009)     Personal LDL goal: 100  (10/11/2009)    Patient will work on the following items until the next clinic visit to reach self-care goals:     Medications and monitoring: take my medicines every day, check my blood sugar, bring all of my medications to every visit, examine my feet every day  (12/05/2010)     Eating: drink diet soda or water instead of juice or soda, eat more vegetables,  use fresh or frozen vegetables, eat foods that are low in salt, eat baked foods instead of fried foods, eat fruit for snacks and desserts, limit or avoid alcohol  (12/05/2010)     Activity: take a 30 minute walk every day  (12/05/2010)     Other: walks 2 miles about 3-4  times a week  (10/21/2010)    Diabetes self-management support: CBG self-monitoring log, Written self-care plan, Education handout, Resources for patients handout  (12/05/2010)   Diabetes care plan printed   Diabetes education handout printed    Hypertension self-management support: Written self-care plan, Education handout, Pre-printed educational material, Resources for patients handout  (12/05/2010)   Hypertension self-care plan printed.   Hypertension education handout printed    Lipid self-management support: Written self-care plan, Education handout, Pre-printed educational material, Resources for patients handout  (12/05/2010)   Lipid self-care plan printed.   Lipid education handout printed      Resource handout printed.    Vital Signs:  Patient profile:   45 year old female Height:      67 inches (170.18 cm) Weight:      199.06 pounds (90.48 kg) BMI:     31.29 Temp:     97.5 degrees F (36.39 degrees C) oral Pulse rate:   96 / minute BP sitting:   137 / 76  (right arm)  Vitals Entered By: Angelina Ok RN (December 05, 2010 1:26 PM)

## 2011-01-02 NOTE — Letter (Signed)
Summary: [facility] Office Visit Note   [facility] Office Visit Note   Imported By: Roderic Ovens 12/27/2010 13:46:21  _____________________________________________________________________  External Attachment:    Type:   Image     Comment:   External Document

## 2011-01-16 NOTE — Letter (Signed)
Summary: Dr Perrin Maltese Office Visit Note   Dr Perrin Maltese Office Visit Note   Imported By: Roderic Ovens 01/09/2011 12:52:45  _____________________________________________________________________  External Attachment:    Type:   Image     Comment:   External Document

## 2011-02-10 LAB — DIFFERENTIAL
Basophils Relative: 0 % (ref 0–1)
Lymphocytes Relative: 19 % (ref 12–46)
Lymphs Abs: 1.7 10*3/uL (ref 0.7–4.0)
Monocytes Absolute: 0.7 10*3/uL (ref 0.1–1.0)
Monocytes Relative: 8 % (ref 3–12)
Neutro Abs: 6.5 10*3/uL (ref 1.7–7.7)
Neutrophils Relative %: 72 % (ref 43–77)

## 2011-02-10 LAB — URINALYSIS, ROUTINE W REFLEX MICROSCOPIC
Bilirubin Urine: NEGATIVE
Ketones, ur: 40 mg/dL — AB
Nitrite: NEGATIVE
Protein, ur: 30 mg/dL — AB
pH: 7 (ref 5.0–8.0)

## 2011-02-10 LAB — URINE MICROSCOPIC-ADD ON

## 2011-02-10 LAB — CBC
HCT: 37 % (ref 36.0–46.0)
Hemoglobin: 12.5 g/dL (ref 12.0–15.0)
MCHC: 33.8 g/dL (ref 30.0–36.0)
WBC: 9 10*3/uL (ref 4.0–10.5)

## 2011-02-10 LAB — URINE CULTURE
Colony Count: >=100000
Culture  Setup Time: 201112041159

## 2011-02-10 LAB — BASIC METABOLIC PANEL
BUN: 11 mg/dL (ref 6–23)
BUN: 7 mg/dL (ref 6–23)
CO2: 29 mEq/L (ref 19–32)
CO2: 29 mEq/L (ref 19–32)
Calcium: 8.8 mg/dL (ref 8.4–10.5)
Chloride: 100 mEq/L (ref 96–112)
Creatinine, Ser: 0.62 mg/dL (ref 0.4–1.2)
Creatinine, Ser: 0.7 mg/dL (ref 0.4–1.2)
Glucose, Bld: 204 mg/dL — ABNORMAL HIGH (ref 70–99)

## 2011-02-10 LAB — HEPATIC FUNCTION PANEL
ALT: 17 U/L (ref 0–35)
AST: 19 U/L (ref 0–37)
Albumin: 3.8 g/dL (ref 3.5–5.2)
Alkaline Phosphatase: 60 U/L (ref 39–117)
Bilirubin, Direct: 0.1 mg/dL (ref 0.0–0.3)
Total Bilirubin: 0.5 mg/dL (ref 0.3–1.2)

## 2011-02-10 LAB — GLUCOSE, CAPILLARY
Glucose-Capillary: 106 mg/dL — ABNORMAL HIGH (ref 70–99)
Glucose-Capillary: 143 mg/dL — ABNORMAL HIGH (ref 70–99)
Glucose-Capillary: 211 mg/dL — ABNORMAL HIGH (ref 70–99)
Glucose-Capillary: 223 mg/dL — ABNORMAL HIGH (ref 70–99)
Glucose-Capillary: 232 mg/dL — ABNORMAL HIGH (ref 70–99)

## 2011-02-11 LAB — CBC
HCT: 38.1 % (ref 36.0–46.0)
Hemoglobin: 12.5 g/dL (ref 12.0–15.0)
MCHC: 32.8 g/dL (ref 30.0–36.0)
MCV: 84.7 fL (ref 78.0–100.0)
RDW: 13.4 % (ref 11.5–15.5)

## 2011-02-11 LAB — CREATININE, SERUM: Creatinine, Ser: 0.65 mg/dL (ref 0.4–1.2)

## 2011-02-11 LAB — GLUCOSE, CAPILLARY: Glucose-Capillary: 161 mg/dL — ABNORMAL HIGH (ref 70–99)

## 2011-02-12 LAB — CBC
HCT: 38.6 % (ref 36.0–46.0)
MCV: 85.8 fL (ref 78.0–100.0)
Platelets: 272 10*3/uL (ref 150–400)
RBC: 4.5 MIL/uL (ref 3.87–5.11)
RDW: 14.1 % (ref 11.5–15.5)
WBC: 6.5 10*3/uL (ref 4.0–10.5)

## 2011-02-12 LAB — POCT I-STAT, CHEM 8
BUN: 16 mg/dL (ref 6–23)
Chloride: 104 mEq/L (ref 96–112)
HCT: 42 % (ref 36.0–46.0)
Potassium: 4.1 mEq/L (ref 3.5–5.1)

## 2011-02-12 LAB — DIFFERENTIAL
Basophils Absolute: 0 10*3/uL (ref 0.0–0.1)
Lymphocytes Relative: 36 % (ref 12–46)
Lymphs Abs: 2.3 10*3/uL (ref 0.7–4.0)
Monocytes Absolute: 0.6 10*3/uL (ref 0.1–1.0)
Neutro Abs: 3.4 10*3/uL (ref 1.7–7.7)

## 2011-02-12 LAB — RAPID STREP SCREEN (MED CTR MEBANE ONLY): Streptococcus, Group A Screen (Direct): NEGATIVE

## 2011-02-12 LAB — POCT PREGNANCY, URINE: Preg Test, Ur: NEGATIVE

## 2011-02-12 LAB — GLUCOSE, CAPILLARY: Glucose-Capillary: 174 mg/dL — ABNORMAL HIGH (ref 70–99)

## 2011-02-17 LAB — GLUCOSE, CAPILLARY: Glucose-Capillary: 71 mg/dL (ref 70–99)

## 2011-02-19 LAB — URINALYSIS, ROUTINE W REFLEX MICROSCOPIC
Ketones, ur: NEGATIVE mg/dL
Nitrite: NEGATIVE
Protein, ur: NEGATIVE mg/dL
Urobilinogen, UA: 0.2 mg/dL (ref 0.0–1.0)

## 2011-02-19 LAB — DIFFERENTIAL
Eosinophils Absolute: 0.1 10*3/uL (ref 0.0–0.7)
Eosinophils Relative: 2 % (ref 0–5)
Lymphs Abs: 1.6 10*3/uL (ref 0.7–4.0)

## 2011-02-19 LAB — BASIC METABOLIC PANEL
BUN: 9 mg/dL (ref 6–23)
CO2: 26 mEq/L (ref 19–32)
Chloride: 109 mEq/L (ref 96–112)
Glucose, Bld: 72 mg/dL (ref 70–99)
Potassium: 4 mEq/L (ref 3.5–5.1)

## 2011-02-19 LAB — POCT PREGNANCY, URINE: Preg Test, Ur: NEGATIVE

## 2011-02-19 LAB — CBC
HCT: 37.9 % (ref 36.0–46.0)
MCHC: 31.7 g/dL (ref 30.0–36.0)
MCV: 83.5 fL (ref 78.0–100.0)
Platelets: 384 10*3/uL (ref 150–400)
WBC: 5.2 10*3/uL (ref 4.0–10.5)

## 2011-02-19 LAB — GC/CHLAMYDIA PROBE AMP, GENITAL
Chlamydia, DNA Probe: NEGATIVE
GC Probe Amp, Genital: NEGATIVE

## 2011-02-21 LAB — CBC
MCV: 84 fL (ref 78.0–100.0)
Platelets: 369 10*3/uL (ref 150–400)
WBC: 6.4 10*3/uL (ref 4.0–10.5)

## 2011-02-21 LAB — URINALYSIS, ROUTINE W REFLEX MICROSCOPIC
Hgb urine dipstick: NEGATIVE
Protein, ur: NEGATIVE mg/dL
Urobilinogen, UA: 0.2 mg/dL (ref 0.0–1.0)

## 2011-02-21 LAB — POCT I-STAT, CHEM 8
Creatinine, Ser: 0.7 mg/dL (ref 0.4–1.2)
HCT: 40 % (ref 36.0–46.0)
Hemoglobin: 13.6 g/dL (ref 12.0–15.0)
Potassium: 4.2 mEq/L (ref 3.5–5.1)
Sodium: 138 mEq/L (ref 135–145)

## 2011-02-21 LAB — DIFFERENTIAL
Basophils Absolute: 0 10*3/uL (ref 0.0–0.1)
Basophils Relative: 0 % (ref 0–1)
Eosinophils Absolute: 0.1 10*3/uL (ref 0.0–0.7)
Lymphs Abs: 1.8 10*3/uL (ref 0.7–4.0)
Neutrophils Relative %: 64 % (ref 43–77)

## 2011-02-21 LAB — POCT PREGNANCY, URINE: Preg Test, Ur: NEGATIVE

## 2011-02-21 LAB — GLUCOSE, CAPILLARY: Glucose-Capillary: 94 mg/dL (ref 70–99)

## 2011-02-24 LAB — GLUCOSE, CAPILLARY: Glucose-Capillary: 113 mg/dL — ABNORMAL HIGH (ref 70–99)

## 2011-03-06 LAB — COMPREHENSIVE METABOLIC PANEL
AST: 18 U/L (ref 0–37)
Albumin: 3.2 g/dL — ABNORMAL LOW (ref 3.5–5.2)
Chloride: 105 mEq/L (ref 96–112)
Creatinine, Ser: 0.66 mg/dL (ref 0.4–1.2)
GFR calc Af Amer: >60 mL/min (ref >60)
Potassium: 3.8 mEq/L (ref 3.5–5.1)
Total Bilirubin: 0.4 mg/dL (ref 0.3–1.2)
Total Protein: 6.9 g/dL (ref 6.0–8.3)

## 2011-03-06 LAB — CBC
MCV: 82.4 fL (ref 78.0–100.0)
Platelets: 349 10*3/uL (ref 150–400)
RDW: 15.8 % — ABNORMAL HIGH (ref 11.5–15.5)
WBC: 5.8 10*3/uL (ref 4.0–10.5)

## 2011-03-06 LAB — GC/CHLAMYDIA PROBE AMP, GENITAL: Chlamydia, DNA Probe: NEGATIVE

## 2011-03-06 LAB — URINALYSIS, ROUTINE W REFLEX MICROSCOPIC
Bilirubin Urine: NEGATIVE
Glucose, UA: NEGATIVE mg/dL
Hgb urine dipstick: NEGATIVE
Ketones, ur: NEGATIVE mg/dL
pH: 6 (ref 5.0–8.0)

## 2011-03-06 LAB — DIFFERENTIAL
Basophils Relative: 0 % (ref 0–1)
Eosinophils Absolute: 0.1 10*3/uL (ref 0.0–0.7)
Eosinophils Relative: 2 % (ref 0–5)
Lymphocytes Relative: 27 % (ref 12–46)
Neutro Abs: 3.6 10*3/uL (ref 1.7–7.7)
Neutrophils Relative %: 63 % (ref 43–77)

## 2011-03-06 LAB — WET PREP, GENITAL
Trich, Wet Prep: NONE SEEN
Yeast Wet Prep HPF POC: NONE SEEN

## 2011-03-06 LAB — GLUCOSE, CAPILLARY
Glucose-Capillary: 48 mg/dL — ABNORMAL LOW (ref 70–99)
Glucose-Capillary: 54 mg/dL — ABNORMAL LOW (ref 70–99)

## 2011-03-06 LAB — PREGNANCY, URINE: Preg Test, Ur: NEGATIVE

## 2011-03-11 ENCOUNTER — Other Ambulatory Visit: Payer: Self-pay | Admitting: Internal Medicine

## 2011-03-11 DIAGNOSIS — I10 Essential (primary) hypertension: Secondary | ICD-10-CM

## 2011-03-11 LAB — GLUCOSE, CAPILLARY: Glucose-Capillary: 159 mg/dL — ABNORMAL HIGH (ref 70–99)

## 2011-03-12 ENCOUNTER — Other Ambulatory Visit: Payer: Self-pay | Admitting: *Deleted

## 2011-03-12 LAB — GLUCOSE, CAPILLARY: Glucose-Capillary: 102 mg/dL — ABNORMAL HIGH (ref 70–99)

## 2011-03-14 MED ORDER — METFORMIN HCL 500 MG PO TABS: 500.0000 mg | ORAL_TABLET | Freq: Two times a day (BID) | ORAL | Status: DC

## 2011-03-18 LAB — GLUCOSE, CAPILLARY: Glucose-Capillary: 183 mg/dL — ABNORMAL HIGH (ref 70–99)

## 2011-03-21 ENCOUNTER — Encounter: Payer: Self-pay | Admitting: Cardiology

## 2011-03-21 ENCOUNTER — Encounter: Payer: Self-pay | Admitting: *Deleted

## 2011-03-24 ENCOUNTER — Ambulatory Visit (INDEPENDENT_AMBULATORY_CARE_PROVIDER_SITE_OTHER): Payer: Medicaid Other | Admitting: Cardiology

## 2011-03-24 ENCOUNTER — Encounter: Payer: Self-pay | Admitting: Cardiology

## 2011-03-24 VITALS — BP 138/82 | HR 89 | Resp 14 | Ht 69.0 in | Wt 206.0 lb

## 2011-03-24 DIAGNOSIS — R9431 Abnormal electrocardiogram [ECG] [EKG]: Secondary | ICD-10-CM

## 2011-03-24 DIAGNOSIS — I10 Essential (primary) hypertension: Secondary | ICD-10-CM

## 2011-03-24 DIAGNOSIS — E7800 Pure hypercholesterolemia, unspecified: Secondary | ICD-10-CM

## 2011-03-24 DIAGNOSIS — R079 Chest pain, unspecified: Secondary | ICD-10-CM

## 2011-03-24 DIAGNOSIS — R072 Precordial pain: Secondary | ICD-10-CM

## 2011-03-24 NOTE — Assessment & Plan Note (Signed)
Blood pressure controlled. 

## 2011-03-24 NOTE — Progress Notes (Signed)
HPI:[name] [name] is a pleasant female with long history of atypical chest pain, sarcoidosis, diabetes, and previously diagnosed long QT.  Her last Myoview was performed on Apr 04, 2008.  At that time, she was found to have an ejection fraction of 55% with normal perfusion.Echocardiogram in July 2005 showed normal LV function. I last saw her in April of 2011. Since then she  She had her thyroid removed. She describes increased fatigue and some dyspnea on exertion but no orthopnea, PND, pedal edema, palpitations or syncope. She has had occasional chest pain. It is substernal without radiation. No associated symptoms. Not pleuritic or positional and not exertional. Duration 5-10 minutes and resolves spontaneously.  Current Outpatient Prescriptions  Medication Sig Dispense Refill  . amLODipine (NORVASC) 10 MG tablet Take 1 tablet (10 mg total) by mouth daily.  30 tablet  11  . aspirin 81 MG tablet Take 81 mg by mouth daily.        Marland Kitchen atorvastatin (LIPITOR) 20 MG tablet Take 20 mg by mouth daily.        Marland Kitchen esomeprazole (NEXIUM) 40 MG capsule Take 40 mg by mouth daily before breakfast.        . ferrous sulfate 325 (65 FE) MG tablet Take 325 mg by mouth daily with breakfast.        . furosemide (LASIX) 40 MG tablet Take 40 mg by mouth 2 (two) times daily.        . insulin glargine (LANTUS) 100 UNIT/ML injection 50 units daily       . meloxicam (MOBIC) 7.5 MG tablet Take 7.5 mg by mouth daily.        . metFORMIN (GLUCOPHAGE) 500 MG tablet Take 1 tablet (500 mg total) by mouth 2 (two) times daily with a meal.  180 tablet  0  . methotrexate (RHEUMATREX) 2.5 MG tablet 4 tabs every thursday       . zolpidem (AMBIEN) 5 MG tablet Take 5 mg by mouth at bedtime as needed.           Past Medical History  Diagnosis Date  . Sarcoidosis   . OSA (obstructive sleep apnea)   . Prolonged QT interval   . Osteoporosis   . Anemia   . GERD (gastroesophageal reflux disease)   . DM (diabetes mellitus)   . Peripheral  neuropathy   . HTN (hypertension)   . Nephrolithiasis     Past Surgical History  Procedure Date  . Hysteroscopy   . Dilation and curettage of uterus     History   Social History  . Marital Status: Married    Spouse Name: N/A    Number of Children: N/A  . Years of Education: N/A   Occupational History  . Not on file.   Social History Main Topics  . Smoking status: Never Smoker   . Smokeless tobacco: Not on file  . Alcohol Use: No  . Drug Use: No  . Sexually Active: Not on file   Other Topics Concern  . Not on file   Social History Narrative  . No narrative on file    ROS: Fatigue but no fevers or chills, productive cough, hemoptysis, dysphasia, odynophagia, melena, hematochezia, dysuria, hematuria, rash, seizure activity, orthopnea, PND, pedal edema, claudication. Remaining systems are negative.  Physical Exam: Well-developed well-nourished in no acute distress.  Skin is warm and dry.  HEENT is normal.  Neck is supple. Status post thyroidectomy Chest is clear to auscultation with normal expansion.  Cardiovascular exam  is regular rate and rhythm.  Abdominal exam nontender or distended. No masses palpated. Extremities show no edema. neuro grossly intact  ECG Sinus rhythm at a rate of 89. Axis normal. Nonspecific ST changes. Prolonged QT.

## 2011-03-24 NOTE — Assessment & Plan Note (Signed)
Continue statin. Lipids and liver monitored by primary care. 

## 2011-03-24 NOTE — Assessment & Plan Note (Addendum)
Symptoms atypical. Multiple risk factors. Schedule Myoview. 

## 2011-03-24 NOTE — Assessment & Plan Note (Signed)
No history of syncope. ?

## 2011-03-24 NOTE — Patient Instructions (Signed)
Your physician recommends that you schedule a follow-up appointment in: one year  Your physician has requested that you have en exercise stress myoview. For further information please visit [email or web address]. Please follow instruction sheet, as given.

## 2011-04-01 ENCOUNTER — Ambulatory Visit (HOSPITAL_COMMUNITY): Payer: Medicaid Other | Attending: Cardiology | Admitting: Radiology

## 2011-04-01 VITALS — Ht 68.0 in | Wt 201.0 lb

## 2011-04-01 DIAGNOSIS — R0789 Other chest pain: Secondary | ICD-10-CM

## 2011-04-01 DIAGNOSIS — E119 Type 2 diabetes mellitus without complications: Secondary | ICD-10-CM

## 2011-04-01 DIAGNOSIS — R0609 Other forms of dyspnea: Secondary | ICD-10-CM

## 2011-04-01 DIAGNOSIS — R072 Precordial pain: Secondary | ICD-10-CM | POA: Insufficient documentation

## 2011-04-01 DIAGNOSIS — I4949 Other premature depolarization: Secondary | ICD-10-CM

## 2011-04-01 MED ORDER — TECHNETIUM TC 99M TETROFOSMIN IV KIT
10.0000 | PACK | Freq: Once | INTRAVENOUS | Status: AC | PRN
  Administered 2011-04-01: 10 via INTRAVENOUS

## 2011-04-01 MED ORDER — TECHNETIUM TC 99M TETROFOSMIN IV KIT
33.0000 | PACK | Freq: Once | INTRAVENOUS | Status: AC | PRN
  Administered 2011-04-01: 33 via INTRAVENOUS

## 2011-04-01 NOTE — Progress Notes (Signed)
[facility] SITE 3 NUCLEAR MED [address] [place] [address] [phone]  Cardiology Nuclear Med Study  [name] [name] is a 45 y.o. female [id number] 21-Jul-1966   Nuclear Med Background Indication for Stress Test:  Evaluation for Ischemia History:  '05 Echo:normal LVF, '09 OZH:[id number], EF=55%, h/o long QT syndrome Cardiac Risk Factors: Hypertension, IDDM Type 2 and Lipids  Symptoms:  Chest Pain/Pressure.  (last episode of chest discomfort was sometime last week), DOE, Fatigue and Fatigue with Exertion   Nuclear Pre-Procedure Caffeine/Decaff Intake:  None NPO After: 11:30pm   Lungs:  Clear.   IV 0.9% NS with Angio Cath:  22g  IV Site: R Antecubital  IV Started by:  Irean Hong, RN  Chest Size (in):  38 Cup Size: D  Height: 5\' 8"  (1.727 m)  Weight:  201 lb (91.173 kg)  BMI:  Body mass index is 30.56 kg/(m^2). Tech Comments:  No diabetic meds this am and BS 141 @ 1130.    Nuclear Med Study 1 or 2 day study: 1 day  Stress Test Type:  Stress  Reading MD: Arvilla Meres, MD  Order Authorizing Provider:  Olga Millers, MD  Resting Radionuclide: Technetium 65m Tetrofosmin  Resting Radionuclide Dose: 10 mCi   Stress Radionuclide:  Technetium 46m Tetrofosmin  Stress Radionuclide Dose: 33 mCi           Stress Protocol Rest HR: 77 Stress HR: 157  Rest BP: 118/82 Stress BP: 178  Exercise Time (min): 8:00 METS: 10.1   Predicted Max HR: 177 bpm % Max HR: 88.7 bpm Rate Pressure Product: 46962   Dose of Adenosine (mg):  n/a Dose of Lexiscan: n/a mg  Dose of Atropine (mg): n/a Dose of Dobutamine: n/a mcg/kg/min (at max HR)  Stress Test Technologist: Duke Salvia, CMA-N  Nuclear Technologist:  Domenic Polite, CNMT     Rest Procedure:  Myocardial perfusion imaging was performed at rest 45 minutes following the intravenous administration of Technetium 76m Tetrofosmin. Rest ECG: Nonspecific T-wave changes.  Stress Procedure:  The patient  exercised for eight minutes on the treadmill utilizing the Bruce protocol.  The patient stopped due to fatigue and denied any chest pain.  There were no diagnostic ST-T wave changes.  Isolated PVC in recovery.  Technetium 47m Tetrofosmin was injected at peak exercise and myocardial perfusion imaging was performed after a brief delay. Stress ECG: No significant ST segment change suggestive of ischemia.  QPS Raw Data Images:  Soft tissue artifact. Stress Images:  There is decreased uptake in the apex. Rest Images:  There is decreased uptake in the apex. Soft-tissue attenuation over inferior wall. Subtraction (SDS):  Small fixed apical defect. Likely soft-tissue attenuation. No ischemia. Transient Ischemic Dilatation (Normal <1.22):  1.12 Lung/Heart Ratio (Normal <0.45):  0.32  Quantitative Gated Spect Images QGS EDV:  111 ml QGS ESV:  57 ml QGS cine images:  Mild apical hypokinesis QGS EF: 49%  Impression Exercise Capacity:  Fair exercise capacity. BP Response:  Normal blood pressure response. Clinical Symptoms:  No chest pain. ECG Impression:  No significant ST segment change suggestive of ischemia. Comparison with Prior Nuclear Study: Fixed apical defect new since previous study.  Overall Impression:  Abnormal stress nuclear study. Small fixed apical defect. Likely soft-tissue attenuation. Cannot exclude previous infarct. No ischemia       Arvilla Meres

## 2011-04-02 NOTE — Progress Notes (Signed)
COPY ROUTED TO DR.CRENSHAW.Falecha Clark ° °

## 2011-04-07 NOTE — Progress Notes (Signed)
pt aware of results [name] [name]  

## 2011-04-15 NOTE — H&P (Signed)
NAMESHILEE, BIGGS              ACCOUNT NO.:  [id number]   MEDICAL RECORD NO.:  [id number]          PATIENT TYPE:  OBV   LOCATION:  4713                         FACILITY:  [facility]   PHYSICIAN:  Gerrit Friends. Dietrich Pates, MD, FACCDATE OF BIRTH:  May 26, 1966   DATE OF ADMISSION:  03/28/2008  DATE OF DISCHARGE:                              HISTORY & PHYSICAL   PRIMARY CARE PHYSICIAN:  [facility].   PRIMARY CARDIOLOGIST:  Madolyn Frieze. Jens Som, MD, [facility]   HISTORY OF PRESENT ILLNESS:  A 45 year old woman presenting with  recurrent episode of chest discomfort.  Ms. Axton's cardiac history  dates to at least 2003 when she was admitted with chest pain.  Her  myocardial infarction was ruled out.  She subsequently has been followed  in the office, most recently undergoing stress testing in 2007, that was  technically difficult, but demonstrated a possible small area of apical  ischemia.  This was managed medically.  She was last seen in mid 2008,  at which time another stress test was performed, but not apparently  carried out.  Ms. Percival has continued to have mild and intermittent  chest discomfort until the morning of admission when she awakened with  more severe symptoms.  She describes intermittent episodes of moderately  severe left upper chest discomfort that is progressive over the course  of a few seconds and then disappears.  There are no associated symptoms.  There is no radiation.  There is no change with movement or body  position.  The discomfort is characterized as a dull ache.  She has  never tried nitroglycerin for this.  She was given aspirin in the  emergency department with some improvement, but the relationship between  these two events are uncertain.   CARDIOVASCULAR RISK FACTORS:  Include diabetes, hyperlipidemia, and  hypertension.  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  There is a history of long QT  syndrome.  She also has had GERD, meningioma, and nephrolithiasis.  Surgeries have included a D&C, uterine ablation, craniotomy with  meningioma resection, and C-section.   MEDICATIONS:  1. A 70/30 insulin 20 units a.m. and 20-30 units p.m.  2. Atorvastatin 20 mg daily.  3. Methotrexate 10 mg daily.  4. Glucotrol 10 mg b.i.d.  5. KCl 20 mEq daily.  6. Folate.  7. Iron.  8. Multivitamin.  9. Aspirin 81 mg daily.  10.Nexium 40 mg daily.  11.She previously took metformin, but this is not among her current      medications.   No known drug allergies.   SOCIAL HISTORY:  Lives in [place] with 2 sons and her husband;  disabled; no use of tobacco products nor alcohol.   FAMILY HISTORY:  Questionable coronary disease; arrhythmia in her  mother.   REVIEW OF SYSTEMS:  Notable for occasional headaches, photophobia,  intermittent dyspnea, dizziness and balance problems, arthralgias of  left hip, and GERD.  All other systems reviewed and are negative.   PHYSICAL EXAMINATION:  GENERAL:  A pleasant, somewhat overweight woman  in no acute distress.  VITAL SIGNS:  The heart rate is 95 and regular, respirations 20, and  blood pressure 130/70.  HEENT:  Anicteric sclerae; EOMs full; normal oral mucosa.  NECK:  No jugular venous distention; no carotid bruits.  ENDOCRINE:  No thyromegaly.  HEMATOPOIETIC:  No adenopathy.  PSYCHIATRIC:  Alert and oriented; normal affect.  LUNGS:  Clear.  CARDIAC:  Normal first and second heart sounds; fourth heart sound  present.  ABDOMEN:  Soft and nontender; no organomegaly.  EXTREMITIES:  No edema; distal pulses intact.  NEUROLOGICAL:  Symmetric strength and tone; normal cranial nerves.   Chest x-ray:  No acute abnormality.   EKG:  Normal sinus rhythm; minor nonspecific ST-T-wave abnormality.  CBC  is normal as his chemistry profile except for glucose of 193.  D-dimer  is normal.  Point of care markers are negative.   IMPRESSION:  Ms. Teschner presents  with recurrent atypical chest discomfort  and initially benign exam and set of laboratory studies.  She will be  placed in observation status to rule out myocardial infarction.  The  possibility of cardiac catheterization was discussed with her.  She is  quite worried about potential complications and would be more inclined  to agree to a repeat stress test, which might not be of any great  benefit.  I will defer this decision to Dr. Jens Som, who has followed  the patient and knows her well and will see her in the a.m.  Her usual  diabetic medications will be continued with monitoring of capillary  blood glucose values.  We will also monitor blood pressure, as she is  not currently on any antihypertensive agent.      Gerrit Friends. Dietrich Pates, MD, [facility]  Electronically Signed     RMR/MEDQ  D:  03/28/2008  T:  03/29/2008  Job:  [id number]

## 2011-04-15 NOTE — Procedures (Signed)
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  She was referred  to the sleep lab for evaluation of obstructive sleep apnea.   EPWORTH SLEEPINESS SCORE:  2   MEDICATIONS:  Insulin, Lipitor, methotrexate, Lasix, glipizide,  potassium, Fosamax, folic acid, ferrous sulfate, calcium, Metformin,  amlodipine, prednisone, Lyrica, and omeprazole.  The patient took 2  Tylenol at 11:55 p.m.   SLEEP ARCHITECTURE:  Total recording time was 425 minutes.  Total sleep  time was 299 minutes.  Sleep efficiency was 70%, which is reduced.  Sleep latency was 68.5 minutes, which is prolonged.  REM latency was 128  minutes.  The patient was observed in all stages of sleep but had a  reduction in the percentage of slow wave sleep to 5% of this study.  The  patient slept predominantly in the nonsupine position.   RESPIRATORY DATA:  The average respiratory rate was 19.  The overall  apnea-hypopnea index was 0.2.  The events were exclusively obstructive  in nature.  The supine apnea-hypopnea index was 0.  The nonsupine apnea-  hypopnea index was 0.2.  The REM apnea-hypopnea index was 1.3.  The non-  REM apnea-hypopnea index was 0.  Occasional snoring was noted by the  technician.   OXYGEN DATA:  The baseline oxygenation was 98%.  The patient spent the  entire test with an oxygen saturation greater than 90%.  The oxygen  saturation nadir was 90%.   CARDIAC DATA:  The average heart rate was 94, and the rhythm strip  showed normal sinus rhythm.   MOVEMENT-PARASOMNIA:  The  periodic limb movement index was 2.8.  No  other abnormal behaviors were noted during the test.   IMPRESSIONS-RECOMMENDATIONS:  This study did not show evidence for sleep  disordered breathing, as her overall apnea-hypopnea index was 0.2 with  an oxygen saturation nadir of 90%.  Please note that she had a very  minimal amount of supine sleep; therefore, any degree of sleep  disordered breathing may be underestimated as a result of this.      Coralyn Helling, MD  Diplomat, American Board of Sleep Medicine  Electronically Signed     VS/MEDQ  D:  06/03/2007 09:40:42  T:  06/03/2007 16:47:15  Job:  [id number]   cc:   Oretha Milch, MD  [address] [place] [address]

## 2011-04-15 NOTE — Assessment & Plan Note (Signed)
[facility]                            CARDIOLOGY OFFICE NOTE   NAME:[name], [name] MUTCH                     MRN:          [id number]  DATE:01/30/2009                            DOB:          [birth year]    [name] [name] is a 45 year old female with long history of atypical chest  pain, sarcoidosis, diabetes, and previously diagnosed long QT.  Since I  last saw her, she is doing reasonably well.  She did state that she  continues to have occasional chest pain.  It is in the left breast and  epigastric area.  That has been a longstanding problem and unchanged in  frequency compared to Apr 19, 2008.  It does not radiate.  It is not  pleuritic or positional nor is related to food.  It is not exertional.  It lasts less than 1 minute and resolves spontaneously.  There is no  associated nausea, vomiting, shortness of breath, or diaphoresis.  Note,  she does have some dyspnea on exertion, but there is no orthopnea or  PND.  She occasionally has mild pedal edema.  There are no palpitations  and she has no history of syncope.  Her last Myoview was performed on  Apr 04, 2008.  At that time, she was found to have an ejection fraction  of 55% with normal perfusion.   PRESENT MEDICATIONS:  1. Glipizide 5 mg p.o. daily.  2. Prednisone 5 mg p.o. every other day.  3. Lipitor 20 mg p.o. daily.  4. Methotrexate 2.5 mg tablets 4 on Thursdays.  5. Lasix 40 mg p.o. daily.  6. Nexium.  7. Folate.  8. Iron sulfate.  9. Glucotrol.  10.Multivitamin.  11.Amlodipine 5 mg p.o. daily.  12.Aspirin 81 mg p.o. daily.  13.Calcium.  14.Metformin 1000 mg p.o. b.i.d.   PHYSICAL EXAMINATION:  VITAL SIGNS:  Today shows a blood pressure of  126/67 and her pulse is 103.  She weighs 239 pounds.  HEENT:  Normal.  NECK:  Supple with no bruits.  CHEST:  Clear.  CARDIOVASCULAR:  Tachycardic rate with regular rhythm.  There are no  murmurs, rubs, or gallops noted.  ABDOMEN:  No tenderness.  EXTREMITIES:  No edema.   Her electrocardiogram shows a sinus tachycardia at rate of 101.  The  axis is normal.  There are nonspecific ST changes.   DIAGNOSES:  1. Atypical chest pain - [name] [name]'s recent Myoview showed normal      perfusion.  Her symptoms are atypical.  An electrocardiogram does      not show any acute ST changes.  We will not pursue this further at      this point.  We could consider catheterization in the future if she      would agree.  2. History of long QT syndrome - her QT is not prolonged today and      there is no history of syncope.  We will not pursue this further at      this point.  3. History of nephrolithiasis.  4. History of sarcoid.  5. Diabetes mellitus.  6. Hyperlipidemia -  her primary care physician is managing this and I      have asked her to follow up with him concerning lipids and liver.   We will see her back in 12 months.     Madolyn Frieze Jens Som, MD, [facility]  Electronically Signed    BSC/MedQ  DD: 01/30/2009  DT: 01/31/2009  Job #: [id number]

## 2011-04-15 NOTE — Assessment & Plan Note (Signed)
[facility]                            CARDIOLOGY OFFICE NOTE   NAME:[name], [name] NEVARES                     MRN:          [id number]  DATE:04/19/2008                            DOB:          [birth year]    [name] [name] is a 45 year old female who has a history of atypical chest  pain, sarcoid, diabetes and long QT.  She was recently admitted to [facility] with what was felt to be atypical chest pain.  She did  rule out myocardial infarction with serial enzymes and a D-dimer was  normal.  We discuss cardiac catheterization given her recurrent  symptoms, but she refused.  She preferred a noninvasive evaluation and  only catheterization if it was positive.  She therefore had an  outpatient Myoview which was performed on Apr 04, 2008.  Ejection  fraction was 55% and there was no ischemia or infarction.  She did have  some brief chest pain last evening.  It was in the left chest area.  Note she has had these symptoms intermittently for years.  The pain is  not exertional.  It is not pleuritic or positional.  There is no  associated nausea, vomiting, shortness of breath or diaphoresis and it  does not radiate.  She denies any dyspnea on exertion.  There is no  syncope.   MEDICATIONS:  1. Potassium.  2. Insulin.  3. Lipitor 20 mg p.o. daily.  4. Methotrexate 2.5 mg p.o. on Thursdays.  5. Lasix 40 mg p.o. daily.  6. Nexium 40 mg daily.  7. Folate.  8. Iron sulfate.  9. Glucotrol.  10.Multivitamin.  11.Amlodipine 5 mg p.o. daily.  12.Aspirin 81 mg daily.  13.Calcium.  14.Metformin 1000 mg p.o. b.i.d.   PHYSICAL EXAMINATION:  VITAL SIGNS:  Today, blood pressure of 111/80 and  pulse is 95.  She weighs 229 pounds.  HEENT:  Normal.  NECK:  Supple.  CHEST:  Clear.  CARDIOVASCULAR:  Regular rate and rhythm.  ABDOMEN:  No tenderness.  EXTREMITIES:  No edema.   Her electrocardiogram shows a sinus rhythm at a rate of 87.  The axis is  normal.   The QT is not prolonged.   DIAGNOSES:  1. History of atypical chest pain - [name] [name]'s Myoview was normal.      Her symptoms are also very atypical and chronic.  We will not      pursue further ischemia evaluation at this time.  We could consider      cardiac catheterization in the future if she would agree.  2. History of long QT syndrome - her QT is not prolonged today and      there is no history of syncope.  3. History of nephrolithiasis.  4. History of sarcoid.  5. Diabetes mellitus - management per primary care.  6. Hyperlipidemia - management per primary care.   We will see back in 9 months.     Madolyn Frieze Jens Som, MD, [facility]  Electronically Signed    BSC/MedQ  DD: 04/19/2008  DT: 04/19/2008  Job #: [id number]

## 2011-04-15 NOTE — Assessment & Plan Note (Signed)
[facility]                             PULMONARY OFFICE NOTE   NAME:[name] [name], [name] [name]                     MRN:          [id number]  DATE:05/05/2007                            DOB:          August 19, 1966    This is a 45 year old [place] American female who I follow here for  sarcoidosis which basically has had little to no pulmonary  manifestation, but mostly manifested as neuro sarcoid. The patient is  being followed for her neuro sarcoid by Dr. Lesia Sago. I am, however,  uncertain of when the last time this patient saw Dr. Anne Hahn. The patient  was last seen in February of 2008, prior to that only sporadically. She  also has issues with long QT syndrome, for which she is being followed  at [facility]. The patient has been a patient of the internal  medicine clinic at [facility] several years back, however she neglected  to follow up and therefore has had no contact with a primary care  physician.   When I asked the patient about her medications, she notes that she is on  insulin, Lipitor, methotrexate, Lasix, Glucophage, K-Dur, Nexium, folic  acid, Ferro sulfate, Glucotrol, multivitamin, Amlodipine, and aspirin.  She is also on Rhinocort p.r.n. I asked her again about prednisone and  the patient states that she is taking her every other day dose. She  has stopped these medications in the past abruptly and has developed  symptoms of frank adrenal insufficiency. The prednisone is mainly being  prescribed for her neuro sarcoid and not for pulmonary symptoms.   The patient presents today stating that she has been waking up gasping  for air. She has been noted also to snore. She also describes cough  productive of yellowish sputum sporadically, but she denies any  hemoptysis, and for the most part has had no discoloration. She does  complain of chronic nasal drainage, mostly in the mornings, and her main  complaint today, however, is that of  chronic constipation and bloating.  She, again, has not seen her primary care doctor for this. I am  astounded that she has been getting refills on her medications given  that no primary care physician has been really managing these.   PHYSICAL EXAMINATION:  VITAL SIGNS:  As noted. Oxygen saturation is 99%  on room air.  GENERAL:  This is an obese, African American female who is no acute  distress.  HEENT:  Significant for nasal turbinate edema.  NECK:  Supple, no adenopathy noted, no JVD.  LUNGS:  Clear to auscultation bilaterally.  CARDIAC:  Regular rate and rhythm, no murmurs, rubs, or gallops heard.  ABDOMEN:  Obese, otherwise unremarkable, note nontender.  EXTREMITIES:  No cyanosis, clubbing, or edema noted.   IMPRESSION:  1. Sarcoidosis. 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  2. Episodes of nocturnal awakenings, unexplained, query obstructive      sleep apnea.  3. Chronic constipation, fatigue, and malaise of uncertain etiology,      query hypothyroidism.  4. Fatigue. Query whether this  is related to potential relative      adrenal insufficiency triggered by the patient's abrupt      discontinuation of prednisone. The plan therefore will be to obtain      cortisol level today, TSH, FreeT4, B-Met, and CBC.  5. The patient was treated with Amitiza 24 mg b.i.d. for her chronic      constipation.  6. The patient was urged to follow up with a primary care doctor. An      appointment will be made for her at the primary care clinic at      [facility].  7. With regards to her sleep study, the patient will be scheduled at      the [facility] to evaluate for potential obstructive      sleep apnea.  8. Follow up will be with Dr. Vassie Loll or any of the pulmonary physicians      in approximately 4-6 weeks time. She has been made aware that I am      leaving the practice.     Gailen Shelter, MD  Electronically Signed    CLG/MedQ  DD:  05/05/2007  DT: 05/06/2007  Job #: [id number]

## 2011-04-15 NOTE — Discharge Summary (Signed)
[name] [name], [name] [name]              ACCOUNT NO.:  [id number]   MEDICAL RECORD NO.:  [id number]          PATIENT TYPE:  OBV   LOCATION:  4713                         FACILITY:  [facility]   PHYSICIAN:  Madolyn Frieze. Jens Som, MD, FACCDATE OF BIRTH:  1966/07/14   DATE OF ADMISSION:  03/28/2008  DATE OF DISCHARGE:  03/29/2008                               DISCHARGE SUMMARY   PROCEDURES:  None.   PRIMARY FINAL DIAGNOSES:  Chest pain, cardiac enzymes negative for  myocardial infarction, and outpatient Myoview planned.   SECONDARY DIAGNOSES:  1. Diabetes.  2. Hyperlipidemia.  3. Hypertension.  4. 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  5. History of long QT.  6. Gastroesophageal reflux disease.  7. History of meningioma.  8. History of nephrolithiasis.  9. Status post Myoview in 2007, showing possible apical ischemia, low-      risk study and an EF of 54%.  10.Status post craniectomy with sarcoid tumor resection.   TIME OF DISCHARGE:  33 minutes.   HOSPITAL COURSE:  [name] [name] is a 45 year old female with no previous  history of coronary artery disease, although she has been evaluated for  chest pain.  She was wakened by chest pain on the day of admission and  when her symptoms did not improve, she came to the hospital where she  was admitted for further evaluation.   Her symptoms would wax and wane without medical therapy, but eventually  resolved.  Her cardiac enzymes were negative for MI and a D-dimer was  negative for PE.  Her vital signs were stable with a systolic blood  pressure in the 120s-130s.  A lipid profile showed a total cholesterol  of 126, triglycerides 86, HDL 32, and LDL 77.  TSH was within normal  limits at 0.505.  Hemoglobin A1c was abnormal at 9.9.  Her CBGs is in  the hospital were elevated at times between 105 and 309.   On March 29, 2008, Dr. Jens Som evaluated [name] [name].  She was offered a  cardiac catheterization because previous Myoview had  been mildly  abnormal and she has multiple cardiac risk factors.  She is preferring a  noninvasive evaluation at this time, but understands that if her Myoview  is negative, she will need a heart cath.  Dr. Jens Som felt she could be  discharged home with an outpatient Myoview scheduled.   DISCHARGE INSTRUCTIONS:  Her activity level is to be increased  gradually.  She is to stick to a low-fat, diabetic diet.  She is to  follow up with Dr. Jens Som on Apr 19, 2008,  at 10:30.  She is to get a  stress test on Apr 04, 2008, 09:30 with no food or drink after midnight  before, no caffeine or decaf for 24 hours before and decrease her  insulin dose by half the day of the procedure.  She is to follow up with  the [facility] clinic and keep her appointment in May.   DISCHARGE MEDICATIONS:  1. Insulin 70/30,  20 units a.m. and 20-30 units p.m. depending on      intake.  2.  Lipitor 20 mg a day.  3. Methotrexate 2.5 mg 4 tablets on Thursday.  4. Glucophage 1000 mg b.i.d.  5. K-Dur is on hold.  6. Folic acid, iron, and vitamins as prior to admission.  7. Glipizide 5 mg daily.  8. Aspirin 81 mg daily.  9. Nexium 40 mg daily.   Of note, she had been on K-Dur 20 mEq a day prior to admission, but had  run out according to the patient several days prior to admission.  As  her potassium was within normal limits at 3.8 and 3.6 on a recheck, this  is currently on hold, but may need to be restarted as an outpatient.      Theodore Demark, PA-C      Madolyn Frieze. Jens Som, MD, [facility]  Electronically Signed    RB/MEDQ  D:  03/29/2008  T:  03/30/2008  Job:  [id number]

## 2011-04-15 NOTE — Assessment & Plan Note (Signed)
 [facility]                             PULMONARY OFFICE NOTE   NAME:[name] [name] UMALI                     MRN:          [id number]  DATE:10/14/2007                            DOB:          [birth year]    HISTORY OF PRESENT ILLNESS:  This is a 45 year old, African-American  female patient previously seen by Dr. Danice Goltz, who has a history  of sarcoidosis mainly manifested as neurosarcoid with little to no  pulmonary manifestation.  The patient has not been seen in the office in  greater than five months.  She was supposed to have followed up with Dr.  Vassie Loll, but reports that she missed her appointment.  The patient presents  today for an acute office visit.  The patient complains of a three-week  history of nasal congestion, sinus pain and pressure, a productive cough  with thick, green sputum, and shortness of breath.  The patient denies  any hemoptysis, orthopnea, PND, or leg swelling.   PAST MEDICAL HISTORY:  Reviewed.   CURRENT MEDICATIONS:  Reviewed.   PHYSICAL EXAMINATION:  GENERAL:  The patient is a pleasant, obese female  in no acute distress.  VITAL SIGNS:  She is afebrile with stable vital signs.  O2 saturation is  99% on room air.  HEENT:  Nasal mucosa is erythematous.  Maxillary sinus tenderness to  percussion.  The posterior pharynx is clear.  NECK:  Supple without adenopathy.  Negative nuchal rigidity.  LUNGS:  Some coarse breath sounds without any wheezing or crackles.  CARDIAC:  Regular rate.  ABDOMEN:  Soft and nontender and obese.  EXTREMITIES:  Warm without any edema.   IMPRESSION AND PLAN:  Acute rhinosinusitis and tracheobronchitis.  The  patient has been given Omnicef x10 days.  Mucinex-DM twice daily.  Nasal  hygiene regimen with saline and Afrin nose spray x5 days.  Instructional  sheet given.  The patient was given a Xopenex nebulizer treatment today  in the office.  The patient will return back in two weeks with Dr.  Vassie Loll  or sooner if needed.     Rubye Oaks, NP  Electronically Signed      Oretha Milch, MD  Electronically Signed   TP/MedQ  DD: 10/14/2007  DT: 10/14/2007  Job #: [id number]

## 2011-04-15 NOTE — Op Note (Signed)
[name] [name], [name] [name]              ACCOUNT NO.:  [id number]   MEDICAL RECORD NO.:  [id number]          PATIENT TYPE:  AMB   LOCATION:  SDC                           FACILITY:  [facility]   PHYSICIAN:  Kathreen Cosier, M.D.DATE OF BIRTH:  1966/07/11   DATE OF PROCEDURE:  07/28/2007  DATE OF DISCHARGE:                               OPERATIVE REPORT   PREOPERATIVE DIAGNOSIS:  Dysfunctional uterine bleeding.   POSTOPERATIVE DIAGNOSIS:   OPERATION PERFORMED:  Hysteroscopy, dilation and curettage, Novasure  ablation.   SURGEON:  Kathreen Cosier, M.D.   ANESTHESIA:  General.   DESCRIPTION OF PROCEDURE:  Under general anesthesia, patient in  lithotomy position, perineum and vagina prepped and draped.  Bladder  emptied with straight catheter.  Uterus was top normal size, adherent to  the anterior abdominal wall, pulled up out of the pelvis.  Speculum  placed in vagina and the cervix injected with 10 mL of 1% Xylocaine.  The endometrial cavity was sounded to 11 cm.  The cervical length was  measured at 6 cm  and the cervical curettage done.  Small amount of  tissue obtained.  The cervix dilated to #27 Shawnie Pons and hysteroscope  inserted.  Cavity was normal except for one polyp.  The procedure  terminated and a sharp curettage performed.  Large amounts of tissue  obtained.  The Novasure device was then inserted and the cavity  integrity test passed.  Ablation was performed at 138 W for one minute  40 seconds.  The fluid deficit was 135 and the cavity width was 5 cm.  Repeat hysteroscopy showed total ablation of the cavity.  The patient  tolerated the procedure well, taken to recovery room in good condition.           ______________________________  Kathreen Cosier, M.D.     BAM/MEDQ  D:  07/28/2007  T:  07/28/2007  Job:  [id number]

## 2011-04-18 NOTE — Op Note (Signed)
[place]. [facility]  Patient:    [name] [name]                      MRN: [id number] Proc. Date: 10/31/99 Adm. Date:  [id number] Attending:  Barton Fanny CC:         Hewitt Shorts, M.D.                           Operative Report  PREOPERATIVE DIAGNOSIS:  Posterior ______ tumor.  POSTOPERATIVE DIAGNOSIS:  Posterior ______ tumor.  PROCEDURE:  Suboccipital craniectomy and resection of tumor.  SURGEON:  Hewitt Shorts, M.D.  ASSISTANT:  Danae Orleans. Venetia Maxon, M.D.  ANESTHESIA:  General endotracheal.  INDICATIONS:  The patient is a 45 year old woman who presented with episodic dizziness, nausea, vomiting, and headache, and who was found by MRI scan to have an enhancing mass lesion in the right side of the inferior roof of the fourth ventricle.  A decision was made to proceed with a craniectomy and resection of tumor.  FINDINGS:  Intraoperatively, the lesion was found in the fourth ventricle.  It as adherent to the posteroinferior cerebellar artery with a fibrous adhesion to the artery.  Portions of the lesion itself were fleshy.  Other portions were a bit gritty.  A near total resection of the lesion was achieve, although, a portion f the lesion that was densely adherent to the artery was left so as to leave the artery itself undisturbed..  Frozen section diagnosis was of a ______ granuloma. Permanent pathology is pending.  The specimen was sent, not only for permanent pathology, but also for AFB and fungal smears and culture.  DESCRIPTION OF PROCEDURE:  The patient was brought to the operating room and placed under general endotracheal anesthesia.  The three-prong Mayfield head holder was applied, and the patient was turned to a prone position with the neck gently flexed.  The patient had already shaved the posterior aspect of her head.  A further closure shave was performed, and then the scalp and neck were prepped with Betadine  soap and solution and draped in a sterile fashion.  The midline was infiltrated with local anesthetic with epinephrine, and a midline incision was ade and carried down through the subcutaneous tissue.  Bipolar cautery and electrocautery were used to maintain hemostasis.  Dissection was carried down to the occipital bone, and then the scalp was elevated, exposing the inion, the suboccipital portion of the occipital bone, and the ring of C1 and the foramen magnum.  Then using a Midas Rex drill with an M2 burr, an M3 burr, and an AM8 burr, a suboccipital craniectomy was performed.  Additionally, we used double action rongeurs and Kerrison punches.  The foramen magnum was removed posteriorly, and  exposure superiorly was carried out such that we could visualize the transverse  sinuses bilaterally.  We then opened the dura in a Y-shaped fashion, hinging the flap superiorly towards the torcular, and flaps inferolaterally to either side. We assembled the Budde halo self-retaining retractor system and placed retractors ith underlying cottonoids on the cerebellar hemispheres.  The microscope was draped and brought into the field for additional magnification and illumination and visualization, and the remainder of the procedure was performed using microdissection technique.  We carefully opened the arachnoid between the hemispheres, taking care to leave the vascular structures undisturbed., and in particular, the arterial structures.  We identified the foramen of Magendie and  divided the inferior portion of the vermis.  We identified the inferior aspect f the floor of the fourth ventricle and gradually dissected superiorly, identifying the choroid plexus, as well as the PICA arteries as we dissected rostrally. Ventral to the PICA arteries, we encountered a fleshy, pinkish-gray tissue. This mass was densely adherent to the PICA arteries and was gradually dissected from the surrounding  structures.  Extreme care was taken to leave the floor of the fourth ventricle undisturbed..  We were able to gradually mobilize the mass.  A specimen was sent for frozen section, which Dr. Clotilde Dieter described as showing granuloma suggesting ______ granuloma with possible diagnosis of sarcoidosis. n the end, we were able to remove the great majority of the mass leaving only small portions of tissue adherent to the PICA arteries.  In the end, over 95% of the ass was removed.  Small pieces were separated and sent for AFB and fungal cultures nd smears, but the great majority of the mass was sent for permanent pathology. The resection cavity was carefully checked for hemostasis, which was established with bipolar cautery.  We did lay a small amount of Surgicel in the resection cavity, which was removed once hemostasis was established.  We then again gently irrigated with saline solution.  Hemostasis was established and confirmed, and then we proceed with closure.  The retractors were gently removed, as were the cottonoid padding that laid under them.  The intracranial cavity was filled with saline, nd then the dura was closed with interrupted with 4-0 Nurolon sutures.  There had een some shrinkage of the dura due to coagulation, and therefore, a dural patch was  performed using ______.  We cut a 4 x 4 cm patch in a diagonal fashion.  It was  sutured in place with multiple interrupted 4-0 Nurolon sutures.  Surgicel and Gelfoam were placed around the margins of the craniectomy, and Gelfoam over the  craniectomy defect.  We then closed the musculature in multiple layers with interrupted undyed #0 Vicryl sutures.  The subcutaneous and subcuticular were closed with interrupted inverted #0 and 2-0 interrupted Vicryl sutures, and the  skin was reapproximated with surgical staples.  The wound was dressed with Adaptic and sterile gauze.  The patient tolerated the procedure well.   The estimated blood loss was 100 cc.  Sponge and needle counts were correct.  Following surgery, the patient was turned back to a supine position.  The three-prong Mayfield head holder  was removed, and the patient was reversed from the anesthetic and transferred to the recovery room for further care. DD:  10/31/99 TD:  11/03/99 Job: [id number]

## 2011-04-18 NOTE — H&P (Signed)
NAME:  [name] [name], [name] [name]                        ACCOUNT NO.:  [id number]   MEDICAL RECORD NO.:  [id number]                   PATIENT TYPE:  INP   LOCATION:  4743                                 FACILITY:  [facility]   PHYSICIAN:  Learta Codding, M.D. [facility]             DATE OF BIRTH:  22-Aug-1966   DATE OF ADMISSION:  08/24/2002  DATE OF DISCHARGE:                                HISTORY & PHYSICAL   PRIMARY CARE PHYSICIAN:  Dante Gang, M.D., [facility].   CURRENT COMPLAINT:  Substernal chest pain, rule out possible pulmonary  embolism.   HISTORY OF PRESENT ILLNESS:  The patient is a 45 year old African-American  female with a past medical history of neurosarcoidosis, diabetes mellitus  and hyperlipidemia, who has been evaluated over the last several weeks by  Dr. Jens Som for recurrent substernal chest pain going on for several  months. The patient had an extensive cardiac workup including  echocardiography and stress Thallium. Both studies were within normal  limits. The patient's chest pain is rather atypical for angina.   She has this pain intermittent now for several months. She states the pain  is across her chest and is described as severe pain with occasional  radiation to the right upper extremity. The pain is mainly right-sided in  location. The pain does not appear to be exertional, but does appear to be  past pleuritic in nature. There is occasional associated shortness of  breath. Typically the pain  lasts for 30 minutes to an hour, but always  seems to resolve spontaneously.   As part of the  workup, Dr. Jens Som obtained a D-dimer which was negative  at 0.27. However, due to continued unexplained chest pain a spiral CT scan  was ordered. This was read today by the radiologist who saw possibly  positive for pulmonary embolism, although there were no large pulmonary  emboli at the level of the left or right pulmonary artery, nor at the  segmental level, but there was mention made of one small subsegmental  filling defect. The results were related to Dr. Jens Som and he requested  admission for the patient for further workup. I was asked also to review the  CT scan by Dr. Jens Som.  The patient's  pretest probability for pulmonary  embolism is rather low, as she is ambulatory and has not had a prior history  of DVT, nor does she have an underlying malignancy. The patient does have a  history of neurosarcoidosis, and also had brain surgery for  neurosarcoidosis. She also reports numbness and tingling in both lower  extremities. She had a recent evaluation of a CT scan of the  spine but  results are still pending.   ALLERGIES:  No known drug allergies.   MEDICATIONS:  1. Aspirin 325 mg q.d.  2. Topimax 100 mg p.o. b.i.d.  3. Insulin 70/30, 50 units q. a.m.  and 30  units q. p.m.  4. Lipitor 20 mg p.o. q.h.s.  5. Methotrexate 2.5 mg  4 tablets each week.  6. Prednisone 30 mg q.o.d.  7. Rhinocort.  8. Singulair 10 mg q.d.  9. Lasix 40 mg q.d.  10.      Glucophage 500 mg 2 tablets p.o. b.i.d.  11.      K-Dur 20 mEq 2 tablets q.d.  12.      Nexium 40 mg q.d.  13.      Folic acid 100 mg p.o. q.d.  14.      Advair.  15.      Iron sulfate.  16.      Glucotrol.   SOCIAL HISTORY:  The patient does not smoke. She does not drink alcohol.  There is no history of  cocaine use.   FAMILY HISTORY:  Positive for congestive heart failure in grandmother and  mother. Also a history of coronary artery disease in grandmother.   PAST MEDICAL HISTORY:  1. History of neurosarcoidosis, on chronic steroid therapy.  2. Diabetes mellitus.  3. Hypertension.  4. Gastroesophageal reflux disease.  5. Hyperlipidemia.  6. Negative cardiac workup with negative stress Cardiolite on July 29, 2002, and negative echocardiogram.  7. Rule out pulmonary sarcoidosis, followed by Dr. Jayme Cloud.  8. History of prior cesarean sections.   REVIEW  OF SYSTEMS:  Neurological symptoms associated with sarcoid,  headaches and dizziness, no palpitations, no fever or chills. Occasional  hemoptysis, evaluated by Dr. Jayme Cloud in the past. No dysphagia or  odynophagia. No hematochezia. No dysuria or hematuria. No orthopnea or PND  and no pedal edema.   PHYSICAL EXAMINATION:  VITAL SIGNS:  Blood pressure 114/78,  heart rate 68  beats per minute, temperature 98.9.  GENERAL:  An African-American female in no acute distress.  HEENT:  Pupils equally round and reactive to light.  Conjunctivae clear.  NECK:  Supple,  normal carotid upstroke, no  carotid bruits, no JVD.  HEART:  Regular rate and rhythm, tachycardic, no murmurs, however.  LUNGS:  Clear breath sounds bilaterally.  SKIN:  Warm and dry.  ABDOMEN:  Soft, nontender, no rebound or guarding. Good bowel sounds.  GU,  RECTAL:  Deferred.  EXTREMITIES:  No edema, 2+ peripheral pulses.  NEUROLOGIC:  The patient was  alert and oriented and grossly nonfocal.   LABORATORY DATA:  A chest x-ray is pending. An EKG demonstrates sinus  tachycardia, short PR interval and delta waves are noted, particularly in  leads 2, 3, AVF and lead V6, consistent with WPW syndrome/accessory pathway.   IMPRESSION AND PLAN:  1. Rule out pulmonary embolism. The patient was admitted due to the concern     of pulmonary embolism after a report was called to Dr. Jens Som. However,     I have carefully reviewed the CT scan with the reading radiologist. It     appears in retrospect, and I certainly agree with this, that this small     filling defect is unlikely to present a pulmonary embolism. The patient     has a remote pretest probability for PE and had a negative D-dimer.     However, we will repeat the D-dimer and will obtain lower extremity     Dopplers, given the complaints of lower extremity pain. However, if both    studies are negative, no further workup is indicated, then I certainly do     not advocate a  pulmonary angiogram at this point in  time.  2. Chest pain. Causa ignota. However, I suspect I suspect this slightly     secondary to the patient's underlying neurosarcoidosis and /or due to     neuropathic  pain associated with diabetes mellitus. The patient may well     need to see the pain center for further evaluation and treatment. There     is no evidence that this chest pain is secondary to ischemic heart     disease at this point, although the patient certainly has multiple risk     factors.  3. Sinus tachycardia. The etiology is not clear, but will obtain a free T4     and TSH. I do not think the patient has inappropriate sinus tachycardia,     although this could be a possibility and may require EP to see the     patient. However, she has no symptoms of palpitations, presyncope or     syncope.  4. Rule out WPW.  I suspect this again is an accidental finding, as the     patient is asymptomatic from this perspective. She has no palpitations or     presyncope or syncope.   PLAN:  Obtain D-dimer and venous Dopplers. If negative, would refer the  patient for further neurological evaluation and followup  also on her CT  scan of the  spine. The patient may need further management from the pain  clinic/pain center, as I suspect this may be secondary to neurosarcoidosis  or some type of neuropathy related to diabetes mellitus.                                               Learta Codding, M.D. [facility]    GED/MEDQ  D:  08/24/2002  T:  08/24/2002  Job:  [id number]   cc:   Madolyn Frieze. Jens Som, M.D. [facility]

## 2011-04-18 NOTE — Discharge Summary (Signed)
[place]. [facility]  Patient:    [name] [name], [name] [name]                     MRN: [id number] Adm. Date:  [id number] Disc. Date: [id number] Attending:  Carmelina Peal Dictator:   Danae Orleans. Venetia Maxon, M.D.                           Discharge Summary  REASON FOR ADMISSION:  Posterior fossa mass.  DISCHARGE DIAGNOSIS:  Sarcoidosis.  ADDITIONAL DIAGNOSES: 1. Nausea and vomiting. 2. Diplopia.  HISTORY OF PRESENT ILLNESS AND HOSPITAL COURSE:  [name] [name] is a 45 year old woman with a mass in the region of the fourth ventricle.  She underwent suboccipital craniectomy and resection of tumor on 10/31/99.  She had a hospital course which was complicated by significant postoperative nausea along with diplopia.  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  The patient was seen in consultation by neurology service.  She gradually had improvement in her nausea, and was started on steroids for sarcoidosis.  She was doing well as of the morning 12/13, at which point she was discharged on 40 mg of prednisone a day with Darvocet-N 100 q.4h. p.r.n. pain, Pepcid 20 mg b.i.d..  She was instructed to follow up with Dr. Newell Coral in the following week for staple removal. DD:  02/14/00 TD:  02/14/00 Job: [id number]

## 2011-04-18 NOTE — Assessment & Plan Note (Signed)
[facility]                            CARDIOLOGY OFFICE NOTE   NAME:[name], [name] [name] [name]                     MRN:          [id number]  DATE:12/11/2006                            DOB:          [birth year]    [name] [name] is a 45 year old female with history of atypical chest pain,  sarcoid, diabetes, and long QT syndrome.  Since I last saw her, she  continues to have occasional chest pain.  It is in the center of her  chest, and also occasionally in upper left chest area.  The pain is no  pleuritic or positional, nor is it related to food.  It is not  exertional.  It can last from several minutes to 30 minutes.  It is  unchanged, and she has had this for quite some time.  Note, a most  recent nuclear study was performed on Apr 01, 2006.  At that time her  ejection fraction was 54%.  There was possible small mild apical  ischemia, but it was felt to be low risk, and we have continued with  medical therapy.  Note, she does have some dyspnea at times related to  her sarcoid.   MEDICATIONS:  1. Insulin.  2. Lipitor 20 mg p.o. daily.  3. Glucophage 1000 mg p.o. b.i.d.  4. Nexium 40 mg p.o. daily.  5. Folate.  6. Iron sulfate.  7. Glucotrol.  8. Multivitamin.  9. Lyrica.   PHYSICAL EXAMINATION:  Today, shows a blood pressure of 170/80 and her  pulse is 92.  She weighs 232 pounds.  CHEST:  Clear.  CARDIOVASCULAR EXAM:  Is regular rate and rhythm.  EXTREMITIES:  Show no edema.  ELECTROCARDIOGRAM:  Shows a sinus rhythm at a rate of 92.  The axis is  normal.  There are nonspecific ST changes in a mildly prolonged QT.   DIAGNOSIS:  1. Continued atypical chest pain.  2. History of long QT.  3. Recent nephrolithiasis.  4. History of sarcoid, predominantly neuro sarcoid.  5. Diabetes mellitus.   PLAN:  [name] [name] continues to have chest pain that does not sound  cardiac.  Her electrocardiogram is unchanged.  Her recent nuclear study  was low risk.  We will  continue with medical therapy, and I have asked  her to take enteric coated aspirin 81 mg p.o. daily.  Her blood pressure  is also elevated, and we will ask Norvasc 5 mg p.o. daily.  I will see  her back in 3 months to follow her blood pressure.  We will most likely  repeat her Myoview at that time.  This is not the location that we would  typically see sarcoid involvement from the heart.  Also of note, she  does have a history of long QT, but has never had a syncopal episode.  Her lipids and liver are being followed by the primary care physician at  [facility].     Madolyn Frieze. Jens Som, MD, [facility]  Electronically Signed    BSC/MedQ  DD: 12/11/2006  DT: 12/11/2006  Job #: [id number]

## 2011-04-18 NOTE — Discharge Summary (Signed)
NAME:  [name] [name], [name] [name]                        ACCOUNT NO.:  [id number]   MEDICAL RECORD NO.:  [id number]                   PATIENT TYPE:  INP   LOCATION:  4743                                 FACILITY:  [facility]   PHYSICIAN:  Learta Codding, M.D. [facility]             DATE OF BIRTH:  01-15-1966   DATE OF ADMISSION:  08/24/2002  DATE OF DISCHARGE:  08/25/2002                                 DISCHARGE SUMMARY   DISCHARGE DIAGNOSES:  1. Chest pain.  2. Neurosarcoidosis.  3. Diabetes mellitus.  4. Hyperlipidemia.   HOSPITAL COURSE:  The patient is a 45 year old female, patient of Dr.  Ludwig Clarks.  She had recently had a D. dimer which was negative at 0.27.  However, due to continued chest pain a spiral CT was ordered.  On the day of  admission, this was read by radiologist who felt it was questionably  positive for a pulmonary embolus.  These results were relayed to Dr.  Jens Som and he requested admission for further workup.  The patient was  seen and admitted by Learta Codding, M.D. [facility].  Dr. Andee Lineman reviewed the CT  scan with the radiologist and it appeared that a single small filling defect  was unlikely to represent a pulmonary embolus.  Along with a negative D.  dimer and a remote pretest probability for pulmonary embolus, this was felt  to lessen the probability of existence of a pulmonary embolus.  However, Dr.  Andee Lineman planned to repeat the D. dimer.  He also thought that the patient's  chest pain was while the etiology was unclear, he felt that one of the  annoying causes may have well been the patient's neurosarcoidosis, plus or  minus neuropathic pain associated with diabetes.  He felt that there was no  evidence that the chest pain was ischemic in nature.  He also noted the  patient was in sinus tachycardia, again the etiology was unclear.  He  ordered thyroid function tests.  He also felt that there was a possibility  that the patient may have WPW.  He thought this was an  accidental finding as  the patient was basically asymptomatic.  He would question an EP  consultation for further evaluation.   On the next day, the patient was seen by Dr. Clerance Lav.  The patient  had no further chest pain or palpitations.  Dr. Ulyess Mort noted that the  patient's D. dimer was again negative and her TSH was within normal limits.  Serial cardiac enzymes were negative for myocardial infarction.  The patient  was then seen by Dr. Berton Mount at the [facility].  After reviewing the patient's electrocardiogram in telemetry, it was felt  that there were some signs of ventricular preexcitation.  He then performed  a bedside Adenosine infusion.  The results were negative and he felt she was  stable for discharge with close follow-up by Dr. Jens Som.  DISCHARGE MEDICATIONS:  1. Aspirin 325 mg q.d.  2. Topamax 100 mg b.i.d.  3. Insulin 70/30, 60 units in the morning and 30 units in the evening.  4. Lipitor 20 mg q.h.s.  5. Methotrexate 2.5 mg 4 times 4 every week.  6. Prednisone 30 mg q.o.d.  7. Rhinocort as needed.  8. Singulair 10 mg q.d.  9. Lasix 40 mg q.d.  10.      Glucophage 1000 mg b.i.d.  11.      K-Dur approximately 40 mEq q.d.  12.      Nexium 40 mg q.d.  13.      Folic acid 100 mg p.o. q.d.  14.      Advair as previously taken.  15.      Iron as previously taken.  16.      Glucotrol as previously taken.   LABORATORY VALUES:  White count 8.1, hemoglobin 11.3, hematocrit 35.2,  platelets 327.  Sodium 147, potassium 3.9, chloride 110.  CO2 25, BUN 14,  creatinine 0.9, glucose 132.  D. dimer was negative at 0.36.  TSH 0.907.   DISCHARGE INSTRUCTIONS:  1. The patient will return to a normal level of activity.  2. She is to follow a low fat, low cholesterol diabetic diet.  3. She is to follow-up with Dr. Alfonse Alpers as needed or scheduled.  4. She will follow-up with Dr. Jens Som in the office in about 2-3 weeks and     the office will  call.     Annett Fabian, P.A. [facility]                  Learta Codding, M.D. [facility]    CKM/MEDQ  D:  08/25/2002  T:  08/29/2002  Job:  [id number]   cc:   Dante Gang, M.D.  [facility]   C. Rudell Cobb McNeer, P.A. [facility]  [address] [place], [place] [address]  Fax: 1   Madolyn Frieze. Jens Som, M.D. [facility]

## 2011-04-18 NOTE — Assessment & Plan Note (Signed)
[facility]                            CARDIOLOGY OFFICE NOTE   NAME:[name] [name], [name] [name]                     MRN:          [id number]  DATE:03/17/2007                            DOB:          December 23, 1965    [name] [name] is a pleasant female and has a history of atypical chest  pain, sarcoid, diabetes and long-QT syndrome.  Her most recent Myoview  was performed on Apr 01, 2006.  At that time, she was thought to have a  small reversible defect with the apex but it was a difficult study.  Her  ejection fraction was 54%.  She has been treated medically.  Since I  last saw her, she continues to have atypical chest pain.  It is in the  substernal area and also in the left axillary area.  It is described as  sharp pain.  It is not clearly exertional nor is it __________  positional.  It lasts for minutes and resolves spontaneously.  She also  occasionally has dyspnea which is unchanged.  She has not had  palpitations or syncope.  Her medications include:  1. Insulin.  2. Lipitor 20 mg p.o. daily.  3. Methotrexate 2.5 mg on Thursdays.  4. Lasix 40 mg p.o. daily.  5. Glucophage 1000 mg p.o. b.i.d.  6. Potassium 40 mEq p.o. daily.  7. Folate.  8. Iron.  9. Glucotrol.  10.Multivitamin.  11.Amlodipine 5 mg p.o. daily.  12.Aspirin 81 mg p.o. daily.   PHYSICAL EXAMINATION:  Shows a blood pressure of 130/80 and her pulse is  87.  NECK:  Supple no bruits.  CHEST:  Clear.  CARDIOVASCULAR EXAMINATION:  Regular rate and rhythm.  EXTREMITIES:  No edema.  Her electrocardiogram shows a sinus rhythm at a rate of 87.  The axis is  normal.  There are nonspecific ST changes.  Her PR interval is short.   DIAGNOSES:  1. Atypical chest pain - The patient continues to have atypical chest      pain but does not sound cardiac.  However, given her questionable      abnormality on previous Myoview and risk factors, we will plan to      repeat her functional study.  If it is low  risk, we will continue      with medical therapy.  She will continue on her aspirin and statin.  2. History of long QT - no history of syncope.  3. Recent nephrolithiasis.  4. History of sarcoid.  5. Diabetes mellitus - managed per primary care.   We will see her back in nine months.     [name] Frieze Jens Som, MD, [facility]  Electronically Signed    BSC/MedQ  DD: 03/17/2007  DT: 03/17/2007  Job #: [id number]

## 2011-04-18 NOTE — Op Note (Signed)
[name] [name], [name] [name]              ACCOUNT NO.:  [id number]   MEDICAL RECORD NO.:  [id number]           PATIENT TYPE:   LOCATION:                                 FACILITY:   PHYSICIAN:  Lindaann Slough, M.D.       DATE OF BIRTH:   DATE OF PROCEDURE:  03/13/2006  DATE OF DISCHARGE:                                 OPERATIVE REPORT   PREOPERATIVE DIAGNOSIS:  Left ureteral stone.   POSTOPERATIVE DIAGNOSIS:  Left ureteral stone.   PROCEDURE DONE:  Cystoscopy, ureteroscopy with stone extraction, left  retrograde pyelogram.   SURGEON:  Danae Chen, M.D.   ANESTHESIA:  General.   INDICATION:  The patient is 45 year old female who is known to have a left  distal ureteral stone.  She has not passed the stone and has been having  pain on and off for the past 2-3 months.  She was seen in the emergency room  about a week ago and the stone is still in the distal ureter.  It measures  about 6 mm.  She is scheduled today for stone manipulation.   Under general anesthesia, the patient was prepped and draped and placed in  the dorsal lithotomy position.  A #22 Wappler cystoscope was inserted in the  bladder.  The bladder mucosa is normal.  There is no stone or tumor in the  bladder..  The stone can be seen at the ureteral orifice.  A sensor tip  guidewire was passed through the cystoscope and into the left ureter.  The  cystoscope was then removed.  A number 6.5 French rigid ureteroscope was  then passed in the bladder and into the distal ureter.  With a nitinol stone  basket, the stone was extracted.   Retrograde pyelogram:   The ureteroscope was then reinserted in the bladder and in the ureter.  Contrast was then injected through the ureteroscope.  The renal pelvis and  ureter appear normal.  There is no evidence of filling defect in the ureter.  There is no evidence of extravasation of contrast.  The ureteroscope was  then removed.  The guidewire was removed.   The cystoscope was then  reinserted in the bladder and the bladder was  emptied.  The cystoscope was then removed.   The patient tolerated the procedure well and left the OR in satisfactory  condition to post anesthesia care unit.      Lindaann Slough, M.D.  Electronically Signed     MN/MEDQ  D:  03/13/2006  T:  03/13/2006  Job:  [id number]

## 2011-04-18 NOTE — Assessment & Plan Note (Signed)
[facility]                             PULMONARY OFFICE NOTE   NAME:[name] [name], [name] [name] IHNEN                     MRN:          [id number]  DATE:01/01/2007                            DOB:          Nov 16, 1966    This is a 45 year old African-American female with a diagnosis of  sarcoidosis, who follows here for the same.  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 Anne Hahn, and  cardiac, followed by [facility].  She has known long QT  syndrome.  She presents today, again not having taken her prednisone for  approximately a week.  She has been prescribed prednisone by Dr. Anne Hahn  for her neurosarcoid.  I have stressed to her, back in August of 2007,  when she abruptly discontinued steroids, not to do this without  discussing it with a physician first.  She presents with complaints of  fatigue and she also has been out of her diuretics and has noted  increased edema.  Her weight reflects this, being up to 232 pounds from  her baseline of 216 pounds.  She also notes cough, productive of yellow  sputum; increased nasal congestion.  She has no fevers, chills or  sweats.   CURRENT MEDICATIONS:  As noted on the intake sheet.  She is out of her  Lasix, K-Dur and prednisone.  She is still supposed to be taking it 30  mg every other day, according to her.  She has been, again, out for one  week.   PHYSICAL EXAMINATION:  VITAL SIGNS:  Noted.  Her oxygen saturation is  93% on room air.  IN GENERAL:  This is an obese, African-American female, who is in no  acute respiratory distress.  HEENT EXAMINATION:  Reveals nasal turbinate edema bilaterally.  She has  mucopurulent nasal discharge.  Pharynx is clear.  NECK:  Supple.  No adenopathy noted, no JVD.  LUNGS:  Clear to auscultation bilaterally.  CARDIAC EXAMINATION:  Regular rate and rhythm, no rubs, murmurs or  gallops heard.  ABDOMEN:  Benign.  EXTREMITIES:  The patient  has 1 to 2+ edema.   IMPRESSION:  1. Acute sinusitis.  2. Sarcoidosis, mostly with neuro and cardiac involvement.  3. Noncompliance with medications.  4. Edema.   PLAN:  1. Plan will be for the patient to resume Lasix and K-Dur as previous,      40 mg daily and 40 mEq potassium daily.  She was given a month's      worth, but needs to get this re-evaluated and prescribed by her      primary care physician at the internal medicine clinic at [facility].  2. She is to resume prednisone and discuss with Dr. Anne Hahn whether      this dose needs to be changed.  I again advised her against      discontinuing this medication in an abrupt fashion.  3. We will treat her sinusitis with Augmentin one tablet twice a day      times ten days.  4. Followup will be  in two weeks' time with our nurse practitioner.      She has to contact us prior to that time, should any new problems      arise.     Gailen Shelter, MD  Electronically Signed    CLG/MedQ  DD: 01/01/2007  DT: 01/01/2007  Job #: [id number]   cc:   Marlan Palau, M.D.  [facility], [facility]

## 2011-04-18 NOTE — Assessment & Plan Note (Signed)
[facility]                             PULMONARY OFFICE NOTE   NAME:[name], [name] GORES                     MRN:          [id number]  DATE:11/19/2006                            DOB:          Dec 11, 1965    HISTORY OF PRESENT ILLNESS:  The patient is a 45 year old African-  American female patient of Dr. 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  The  patient also has a history of mild asthma and chronic rhinitis.  The  patient presents for an acute office visit, complaining of a one-week  history of productive cough with thick yellowish green sputum, sore  throat and nasal congestion.  The patient denies any hemoptysis,  orthopnea, PND or leg swelling.   PAST MEDICAL HISTORY:  Reviewed.   CURRENT MEDICATIONS:  Reviewed.   PHYSICAL EXAMINATION:  The patient is a pleasant female, in no acute  distress.  She is afebrile with stable vital signs.  Her O2 saturation  is 100% on room air.  HEENT:  Nasal mucosa is noted with some redness and mild turbinate  edema.  Nontender sinuses.  TMs normal.  NECK:  The neck is supple without adenopathy.  No JVD.  LUNGS:  Lung sounds reveal coarse breath sounds bilaterally.  CARDIAC:  Regular rate.  ABDOMEN:  Soft and nontender.  EXTREMITIES:  Without any edema.   IMPRESSION AND PLAN:  Acute upper respiratory infection.  The patient  was given Omnicef times seven days.  Mucinex DM twice daily.  Endal HD 8  ounces one to two teaspoons every 4-6 hours as needed for cough.  The  patient is to return back with Dr. Jayme Cloud in one month or sooner if  needed.      Rubye Oaks, NP  Electronically Signed      Gailen Shelter, MD  Electronically Signed   TP/MedQ  DD: 11/19/2006  DT: 11/20/2006  Job #: [id number]

## 2011-04-18 NOTE — Discharge Summary (Signed)
NAMEJUDA, LAJEUNESSE              ACCOUNT NO.:  [id number]   MEDICAL RECORD NO.:  [id number]          PATIENT TYPE:  INP   LOCATION:  9306                          FACILITY:  [facility]   PHYSICIAN:  Kathreen Cosier, M.D.DATE OF BIRTH:  [birth year]   DATE OF ADMISSION:  07/29/2007  DATE OF DISCHARGE:  07/31/2007                               DISCHARGE SUMMARY   The patient is a 45 year old gravida 5, para 4-0-1-4.  She had a  NovaSure ablation performed on July 28, 2007, and then since then  complained of persistent lower abdominal pain and vomiting.  She has a  history of diabetes, sarcoid, meningioma, reflux increased cholesterol.   On admission she was afebrile.  Her vital signs were normal.  She had  tender lower abdomen.  No distention.  Good bowel sounds. passing gas  with no problem.  Uterus was top normal, slightly tender.  White count  18,000.   She was admitted with a possible endometritis and started on clindamycin  and gentamicin IV.  She did well and by August 30 was discharged home  clindamycin p.o., to see me in 2 weeks for follow-up.   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           ______________________________  Kathreen Cosier, M.D.     BAM/MEDQ  D:  08/25/2007  T:  08/25/2007  Job:  [id number]

## 2011-04-24 ENCOUNTER — Encounter: Payer: Self-pay | Admitting: Internal Medicine

## 2011-04-24 ENCOUNTER — Telehealth: Payer: Self-pay | Admitting: *Deleted

## 2011-04-24 NOTE — Telephone Encounter (Signed)
Approval on Atorvastatin till 04/23/12 - pt and [facility] aware. Stanton Kidney Shelma Eiben RN 04/24/11 10:30AM

## 2011-05-08 ENCOUNTER — Other Ambulatory Visit: Payer: Self-pay | Admitting: Internal Medicine

## 2011-05-08 DIAGNOSIS — E039 Hypothyroidism, unspecified: Secondary | ICD-10-CM

## 2011-05-08 MED ORDER — LEVOTHYROXINE SODIUM 100 MCG PO TABS: 100.0000 ug | ORAL_TABLET | Freq: Every day | ORAL | Status: DC

## 2011-05-08 NOTE — Assessment & Plan Note (Signed)
Check TSH on follow up.

## 2011-05-12 ENCOUNTER — Other Ambulatory Visit: Payer: Self-pay | Admitting: *Deleted

## 2011-05-12 MED ORDER — ESOMEPRAZOLE MAGNESIUM 40 MG PO CPDR: 40.0000 mg | DELAYED_RELEASE_CAPSULE | Freq: Every day | ORAL | Status: DC

## 2011-05-15 ENCOUNTER — Other Ambulatory Visit (INDEPENDENT_AMBULATORY_CARE_PROVIDER_SITE_OTHER): Payer: Self-pay | Admitting: General Surgery

## 2011-05-15 LAB — TSH: TSH: 7.859 u[IU]/mL — ABNORMAL HIGH (ref 0.350–4.500)

## 2011-05-22 ENCOUNTER — Encounter: Payer: Self-pay | Admitting: Internal Medicine

## 2011-05-22 ENCOUNTER — Ambulatory Visit (INDEPENDENT_AMBULATORY_CARE_PROVIDER_SITE_OTHER): Payer: Medicaid Other | Admitting: Internal Medicine

## 2011-05-22 DIAGNOSIS — E785 Hyperlipidemia, unspecified: Secondary | ICD-10-CM

## 2011-05-22 DIAGNOSIS — R5383 Other fatigue: Secondary | ICD-10-CM

## 2011-05-22 DIAGNOSIS — E039 Hypothyroidism, unspecified: Secondary | ICD-10-CM

## 2011-05-22 DIAGNOSIS — S8410XA Injury of peroneal nerve at lower leg level, unspecified leg, initial encounter: Secondary | ICD-10-CM

## 2011-05-22 DIAGNOSIS — D869 Sarcoidosis, unspecified: Secondary | ICD-10-CM

## 2011-05-22 DIAGNOSIS — E119 Type 2 diabetes mellitus without complications: Secondary | ICD-10-CM

## 2011-05-22 DIAGNOSIS — R5381 Other malaise: Secondary | ICD-10-CM

## 2011-05-22 DIAGNOSIS — I10 Essential (primary) hypertension: Secondary | ICD-10-CM

## 2011-05-22 LAB — POCT GLYCOSYLATED HEMOGLOBIN (HGB A1C): Hemoglobin A1C: 6.8

## 2011-05-22 MED ORDER — LEVOTHYROXINE SODIUM 125 MCG PO TABS: 125.0000 ug | ORAL_TABLET | Freq: Every day | ORAL | Status: DC

## 2011-05-22 MED ORDER — PREGABALIN 50 MG PO CAPS: 50.0000 mg | ORAL_CAPSULE | Freq: Two times a day (BID) | ORAL | Status: DC

## 2011-05-22 NOTE — Patient Instructions (Signed)
Please make a follow up appointment in 2 months. Take all medication as directed.

## 2011-05-22 NOTE — Progress Notes (Signed)
  Subjective:    Patient ID: [name] [name], female    DOB: 1966/10/02, 45 y.o.   MRN: [id number]  HPI  45 yr old woman with  Past Medical History  Diagnosis Date  . Sarcoidosis   . OSA (obstructive sleep apnea)   . Prolonged QT interval   . Osteoporosis   . Anemia   . GERD (gastroesophageal reflux disease)   . DM (diabetes mellitus)   . Peripheral neuropathy   . HTN (hypertension)   . Nephrolithiasis   . Chronic fatigue    comes to the clinic for regular check up of hypertension, diabetes mellutis, hypothyroidism. Patient reports that she has been dieting and has lost 38 lbs. Patient has been having hypoglycemic events so she stopped taking lantus.   Hypothyroidism: Patient reports that she has been feeling fatigued. She had blood work done last week.   Neurosarcoidosis: Patient reports to have seen Dr. Anne Hahn last week. Patient is going to get repeat MRI in 4 months. Patient reports to have nerve pain that runs down her leg which prevents her from sleeping.   Review of Systems  All other systems reviewed and are negative.       Objective:   Physical Exam  Constitutional: She is oriented to person, place, and time. She appears well-developed and well-nourished.  HENT:  Head: Normocephalic and atraumatic.  Mouth/Throat: Oropharynx is clear and moist.  Eyes: Conjunctivae and EOM are normal. Pupils are equal, round, and reactive to light.  Neck: Normal range of motion. Neck supple.  Cardiovascular: Normal rate, regular rhythm and normal heart sounds.   Pulmonary/Chest: Effort normal and breath sounds normal.  Abdominal: Soft. Bowel sounds are normal.  Musculoskeletal: Normal range of motion.  Neurological: She is alert and oriented to person, place, and time.  Skin:       Healed scar noted in neck  Psychiatric: She has a normal mood and affect.          Assessment & Plan:

## 2011-05-26 ENCOUNTER — Encounter: Payer: Self-pay | Admitting: Internal Medicine

## 2011-05-26 NOTE — Assessment & Plan Note (Signed)
Started patient on lyrica. Will follow up.

## 2011-05-26 NOTE — Assessment & Plan Note (Signed)
Per Dr. Anne Hahn. Follow up neuroimaging results in 4 months.

## 2011-05-26 NOTE — Assessment & Plan Note (Signed)
Uncontrolled. TSH elevated. Will increase dose of synthroid to daily. Recheck TSH 4-6 weeks.

## 2011-05-26 NOTE — Assessment & Plan Note (Signed)
Most likely due to hypothyroidism as this time.

## 2011-05-26 NOTE — Assessment & Plan Note (Signed)
At goal. Continue current regimen. 

## 2011-05-26 NOTE — Assessment & Plan Note (Signed)
Stable. Continue current regimen. Get blood work done in Dr. Anne Hahn office.

## 2011-05-26 NOTE — Assessment & Plan Note (Signed)
Good control. Due to hypoglycemic events and patient not having a meter at this time. Will have patient hold lantus, prescribe new diabetic meter, check blood glucose levels at least three times per day, review cbg on follow up, and reassess the need to restart lantus. Diabetic eye exam and microalbumin/creatinine ratio has been done for this year.

## 2011-06-06 ENCOUNTER — Telehealth: Payer: Self-pay | Admitting: *Deleted

## 2011-06-06 NOTE — Telephone Encounter (Signed)
Contacted medicaid at [phone] after receiving prior authorization request for Ly[facility] from [facility] [phone]).  Per medicaid, pt must try and fail neurontin before they will pay for it.  Will forward to attending MD since ordering MD (DrVega) has graduated from the [employer], Charlyn Vialpando Cassady7/6/201212:16 PM

## 2011-06-16 ENCOUNTER — Other Ambulatory Visit: Payer: Self-pay | Admitting: Internal Medicine

## 2011-06-16 MED ORDER — GABAPENTIN 300 MG PO CAPS: 300.0000 mg | ORAL_CAPSULE | Freq: Two times a day (BID) | ORAL | Status: DC

## 2011-06-16 NOTE — Telephone Encounter (Signed)
Message sent to attending pool.Kingsley Spittle Cassady7/16/201211:38 AM

## 2011-06-16 NOTE — Telephone Encounter (Signed)
She has not been on neurontin so I started her on 300mg  bid and we can see how she does on it. I sent it in to her pharmacy.

## 2011-06-26 ENCOUNTER — Emergency Department (HOSPITAL_COMMUNITY): Payer: Medicaid Other

## 2011-06-26 ENCOUNTER — Encounter (HOSPITAL_COMMUNITY): Payer: Self-pay

## 2011-06-26 ENCOUNTER — Emergency Department (HOSPITAL_COMMUNITY)
Admission: EM | Admit: 2011-06-26 | Discharge: 2011-06-26 | Disposition: A | Discharge status: Home / Self Care | Payer: Medicaid Other | Attending: Emergency Medicine | Admitting: Emergency Medicine

## 2011-06-26 DIAGNOSIS — D869 Sarcoidosis, unspecified: Secondary | ICD-10-CM | POA: Insufficient documentation

## 2011-06-26 DIAGNOSIS — E119 Type 2 diabetes mellitus without complications: Secondary | ICD-10-CM | POA: Insufficient documentation

## 2011-06-26 DIAGNOSIS — N201 Calculus of ureter: Secondary | ICD-10-CM | POA: Insufficient documentation

## 2011-06-26 DIAGNOSIS — R109 Unspecified abdominal pain: Secondary | ICD-10-CM | POA: Insufficient documentation

## 2011-06-26 LAB — URINALYSIS, ROUTINE W REFLEX MICROSCOPIC
Glucose, UA: NEGATIVE mg/dL
Ketones, ur: NEGATIVE mg/dL
Nitrite: NEGATIVE
Protein, ur: NEGATIVE mg/dL
pH: 6.5 (ref 5.0–8.0)

## 2011-06-26 LAB — DIFFERENTIAL
Basophils Absolute: 0 10*3/uL (ref 0.0–0.1)
Basophils Relative: 1 % (ref 0–1)
Eosinophils Absolute: 0.2 10*3/uL (ref 0.0–0.7)
Neutro Abs: 3.2 10*3/uL (ref 1.7–7.7)
Neutrophils Relative %: 50 % (ref 43–77)

## 2011-06-26 LAB — COMPREHENSIVE METABOLIC PANEL
ALT: 20 U/L (ref 0–35)
AST: 26 U/L (ref 0–37)
Alkaline Phosphatase: 69 U/L (ref 39–117)
CO2: 28 mEq/L (ref 19–32)
GFR calc Af Amer: >60 mL/min (ref >60)
Glucose, Bld: 94 mg/dL (ref 70–99)
Potassium: 4.2 mEq/L (ref 3.5–5.1)
Sodium: 137 mEq/L (ref 135–145)
Total Protein: 8.1 g/dL (ref 6.0–8.3)

## 2011-06-26 LAB — CBC
Hemoglobin: 11.5 g/dL — ABNORMAL LOW (ref 12.0–15.0)
MCHC: 31.2 g/dL (ref 30.0–36.0)
Platelets: 318 10*3/uL (ref 150–400)
RBC: 4.25 MIL/uL (ref 3.87–5.11)

## 2011-06-26 LAB — URINE MICROSCOPIC-ADD ON

## 2011-06-26 LAB — PREGNANCY, URINE: Preg Test, Ur: NEGATIVE

## 2011-06-26 MED ORDER — IOHEXOL 300 MG/ML  SOLN
100.0000 mL | Freq: Once | INTRAMUSCULAR | Status: AC | PRN
  Administered 2011-06-26: 100 mL via INTRAVENOUS

## 2011-06-27 ENCOUNTER — Telehealth: Payer: Self-pay | Admitting: *Deleted

## 2011-06-27 DIAGNOSIS — N200 Calculus of kidney: Secondary | ICD-10-CM

## 2011-06-27 NOTE — Telephone Encounter (Signed)
Pt called in stating she was seen in ED on 7/26 and she was positive for kidney stone.  She was referred to [facility]. Pt has Medicaid and needs a referral from [name], PCP before she can get this appointment.  Will you put this in? Also she was told her  Lipase level was high (110 ).  Can f/u of visit wait until next week ?

## 2011-06-27 NOTE — Telephone Encounter (Signed)
Pt scheduled for next week  Referral done at [facility] Pt informed

## 2011-06-27 NOTE — Telephone Encounter (Signed)
Scheduled  for Monday of next week.

## 2011-06-27 NOTE — Telephone Encounter (Signed)
Yes the lipase can wait until next week.

## 2011-06-30 ENCOUNTER — Encounter: Payer: Self-pay | Admitting: Internal Medicine

## 2011-06-30 ENCOUNTER — Ambulatory Visit (INDEPENDENT_AMBULATORY_CARE_PROVIDER_SITE_OTHER): Payer: Medicaid Other | Admitting: Internal Medicine

## 2011-06-30 DIAGNOSIS — E119 Type 2 diabetes mellitus without complications: Secondary | ICD-10-CM

## 2011-06-30 DIAGNOSIS — I10 Essential (primary) hypertension: Secondary | ICD-10-CM

## 2011-06-30 DIAGNOSIS — N200 Calculus of kidney: Secondary | ICD-10-CM

## 2011-06-30 DIAGNOSIS — Z87442 Personal history of urinary calculi: Secondary | ICD-10-CM

## 2011-06-30 DIAGNOSIS — E786 Lipoprotein deficiency: Secondary | ICD-10-CM

## 2011-06-30 LAB — BASIC METABOLIC PANEL
CO2: 27 mEq/L (ref 19–32)
Calcium: 9.1 mg/dL (ref 8.4–10.5)
Chloride: 105 mEq/L (ref 96–112)
Glucose, Bld: 83 mg/dL (ref 70–99)
Sodium: 140 mEq/L (ref 135–145)

## 2011-06-30 LAB — LIPASE: Lipase: 49 U/L (ref 0–75)

## 2011-06-30 MED ORDER — OXYCODONE-ACETAMINOPHEN 5-325 MG PO TABS: 1.0000 | ORAL_TABLET | ORAL | Status: AC | PRN

## 2011-06-30 MED ORDER — ONDANSETRON HCL 4 MG PO TABS: 4.0000 mg | ORAL_TABLET | Freq: Three times a day (TID) | ORAL | Status: AC | PRN

## 2011-06-30 MED ORDER — TAMSULOSIN HCL 0.4 MG PO CAPS: 0.4000 mg | ORAL_CAPSULE | ORAL | Status: DC

## 2011-06-30 NOTE — Assessment & Plan Note (Signed)
Patient was noted to have a 3mm mid left ureter stone resulting to moderate proximal hydroureteronephrosis  on CT on 07/27. Patient was given percocet and Flomax from the ED and was asked to follow up with Dr Brunilda Payor on 08/15. Will provide Urology referral. I refilled the percocet and flomax and provided Zofran for nausea. Patient was informed to increase fluid intake as much as possible. Furthermore informed that she needs to be evaluated if she is experiencing fevers and chills and the pain is worsening. Will try to get her an appointment earlier then 8/15.  At the next office visit consider to evaluate for recurrent nephrolithiasis. No documentation present.

## 2011-06-30 NOTE — Progress Notes (Signed)
  Subjective:    Patient ID: [name] [name], female    DOB: 01/04/1966, 45 y.o.   MRN: [id number]  HPI This is a 45 year old female with PMH as underlined above who presented to the clinic for a follow up from the ED where she was found to have kidney stone located at the  3 mm mid left ureter esulting in mild to moderate proximal   Hydroureteronephrosis. She has an appointment with Dr Brunilda Payor on 8/15 and needed an Urology referral. The patient noted that she is still having flank pain. The pain is somewhat relieved with pain medication. She further mentioned that she has nausea but denies any episodes of emesis. Denies any fevers or chill. She did not pass the stone yet. She mentioned that she had similar episodes in the past and needed intervention at that time.    Review of Systems  Constitutional: Positive for appetite change. Negative for fever and chills.  Respiratory: Negative for cough.   Cardiovascular: Positive for palpitations. Negative for chest pain.  Gastrointestinal: Positive for nausea and abdominal pain. Negative for vomiting.  Genitourinary: Positive for flank pain. Negative for dysuria, frequency, difficulty urinating and dyspareunia.  Musculoskeletal: Positive for back pain.       Objective:   Physical Exam  Constitutional: She is oriented to person, place, and time. She appears well-developed.  Cardiovascular: Normal rate and regular rhythm.   Pulmonary/Chest: Effort normal and breath sounds normal.  Abdominal: Soft. She exhibits distension. There is no tenderness.  Musculoskeletal:       Arms: Neurological: She is alert and oriented to person, place, and time.          Assessment & Plan:   No problem-specific assessment & plan notes found for this encounter.

## 2011-07-01 ENCOUNTER — Telehealth: Payer: Self-pay | Admitting: Internal Medicine

## 2011-07-01 NOTE — Telephone Encounter (Signed)
Informed the patient about bmet results. The patient was not able to get an earlier appointment with Dr Brunilda Payor.

## 2011-07-16 ENCOUNTER — Other Ambulatory Visit: Payer: Self-pay | Admitting: *Deleted

## 2011-07-16 MED ORDER — METFORMIN HCL 500 MG PO TABS: 500.0000 mg | ORAL_TABLET | Freq: Two times a day (BID) | ORAL | Status: DC

## 2011-07-16 NOTE — Telephone Encounter (Signed)
Last OV 06/30/11.

## 2011-07-22 ENCOUNTER — Other Ambulatory Visit: Payer: Self-pay | Admitting: Internal Medicine

## 2011-08-03 ENCOUNTER — Other Ambulatory Visit: Payer: Self-pay | Admitting: Internal Medicine

## 2011-08-07 ENCOUNTER — Other Ambulatory Visit (HOSPITAL_COMMUNITY): Payer: Self-pay | Admitting: Obstetrics

## 2011-08-07 ENCOUNTER — Telehealth: Payer: Self-pay | Admitting: *Deleted

## 2011-08-07 DIAGNOSIS — Z1231 Encounter for screening mammogram for malignant neoplasm of breast: Secondary | ICD-10-CM

## 2011-08-07 NOTE — Telephone Encounter (Signed)
Pt calls and states her husband just called and told her he is positive for syphilis, she desires an appt and is very upset, per doris she is to come to clinic at 1400 for first available appt on 9/7.

## 2011-08-07 NOTE — Telephone Encounter (Signed)
Noted  

## 2011-08-08 ENCOUNTER — Ambulatory Visit (HOSPITAL_COMMUNITY)
Admission: RE | Admit: 2011-08-08 | Discharge: 2011-08-08 | Disposition: A | Discharge status: Home / Self Care | Payer: Medicaid Other | Source: Ambulatory Visit | Attending: Obstetrics | Admitting: Obstetrics

## 2011-08-08 ENCOUNTER — Encounter: Payer: Self-pay | Admitting: Internal Medicine

## 2011-08-08 ENCOUNTER — Ambulatory Visit (INDEPENDENT_AMBULATORY_CARE_PROVIDER_SITE_OTHER): Payer: Medicaid Other | Admitting: Internal Medicine

## 2011-08-08 VITALS — BP 129/81 | HR 81 | Temp 97.6°F | Ht 68.0 in | Wt 202.5 lb

## 2011-08-08 DIAGNOSIS — Z1231 Encounter for screening mammogram for malignant neoplasm of breast: Secondary | ICD-10-CM | POA: Insufficient documentation

## 2011-08-08 DIAGNOSIS — Z23 Encounter for immunization: Secondary | ICD-10-CM

## 2011-08-08 DIAGNOSIS — G4733 Obstructive sleep apnea (adult) (pediatric): Secondary | ICD-10-CM

## 2011-08-08 DIAGNOSIS — Z0000 Encounter for general adult medical examination without abnormal findings: Secondary | ICD-10-CM | POA: Insufficient documentation

## 2011-08-08 DIAGNOSIS — N200 Calculus of kidney: Secondary | ICD-10-CM

## 2011-08-08 DIAGNOSIS — E039 Hypothyroidism, unspecified: Secondary | ICD-10-CM

## 2011-08-08 DIAGNOSIS — E119 Type 2 diabetes mellitus without complications: Secondary | ICD-10-CM

## 2011-08-08 DIAGNOSIS — Z113 Encounter for screening for infections with a predominantly sexual mode of transmission: Secondary | ICD-10-CM

## 2011-08-08 LAB — GLUCOSE, CAPILLARY: Glucose-Capillary: 107 mg/dL — ABNORMAL HIGH (ref 70–99)

## 2011-08-08 MED ORDER — GLUCOSE BLOOD VI STRP: ORAL_STRIP | Status: DC

## 2011-08-08 MED ORDER — FLUTICASONE-SALMETEROL 100-50 MCG/DOSE IN AEPB: 2.0000 | INHALATION_SPRAY | Freq: Two times a day (BID) | RESPIRATORY_TRACT | Status: DC

## 2011-08-08 NOTE — Progress Notes (Signed)
  Subjective:    Patient ID: [name] [name], female    DOB: 26-Aug-1966, 45 y.o.   MRN: [id number]  HPI    Review of Systems     Objective:   Physical Exam        Assessment & Plan:  Pt would like to change care to Dr.  Su Hilt at [facility].

## 2011-08-08 NOTE — Assessment & Plan Note (Signed)
Flu shot given today

## 2011-08-08 NOTE — Assessment & Plan Note (Signed)
The patient's boyfriend's positive RPR was likely a remnant of his prior episode of syphilis.  However, given the lack of certainty, this patient should still be screened for syphilis. -RPR was sent, with reflex FTA-abs -HIV screening sent -patient will soon be establishing care with a new OB/GYN, and prefers to receive her pap smear and gonorrhea/chlamydia tests at that time.

## 2011-08-08 NOTE — Progress Notes (Signed)
HPI The patient is a 45 yo woman with a history of neurosarcoidosis (managed by Dr. Anne Hahn), DM, HL, iron deficiency anemia, OSA, HTN, and hypothyroidism, presenting for an STD screen.  The patient notes that her boyfriend was diagnosed with syphilis in 2009, and received treatment.  The patient also received treatment at that time, though her RPR's were negative in 2009 and 2011.  Her boyfriend has had negative RPR's since that time, but recently saw a new physician, got a positive RPR result, and was told that he had syphilis again.  He followed-up with his PCP, who informed him that he likely did not re-contract syphilis, and explained that RPR may remain positive for life.  The patient is partially relieved by this new information, but still desires to be screened for syphilis.  She is currently sexually active with only her boyfriend, and has only had 1 sexual partner in the last year.  She notes no fevers, chills, genital ulcers, or rash.  She sees an OB/GYN for yearly pap smears, though she is currently transitioning to a new OB/GYN.  The patient also has a history of hypothyroidism, s/p thyroid resection 01/2011, and was recently started on synthroid about 2.5 months ago.  She notes feelings of fatigue, constipation, cold intolerance, and difficulty concentrating since the thyroid resection, but believes these symptoms are somewhat better since starting synthroid.  The patient also has a history of diabetes, currently managed with metformin, and notes that she has instituted several diet and exercise changes that have allowed her to lose weight recently.  Her Hb A1C is 6.5 today, previously 6.8.  ROS: General: no fevers, chills, changes in appetite Skin: no rash HEENT: no blurry vision, hearing changes, sore throat Pulm: no dyspnea, coughing, wheezing CV: no chest pain, palpitations, shortness of breath Abd: no abdominal pain, nausea/vomiting, diarrhea/constipation GU: no dysuria, hematuria,  polyuria Ext: no arthralgias, myalgias Neuro: no weakness, numbness, or tingling  Filed Vitals:   08/08/11 1413  BP: 129/81  Pulse: 81  Temp: 97.6 F (36.4 C)    PEX General: alert, cooperative, and in no apparent distress HEENT: pupils equal round and reactive to light, vision grossly intact, oropharynx clear and non-erythematous  Neck: supple, no lymphadenopathy, JVD, or carotid bruits Lungs: clear to ascultation bilaterally, normal work of respiration, no wheezes, rales, ronchi Heart: regular rate and rhythm, no murmurs, gallops, or rubs Abdomen: soft, non-tender, non-distended, normal bowel sounds Msk: no joint edema, warmth, or erythema Extremities: 2+ DP/PT pulses bilaterally, no cyanosis, clubbing, or edema Neurologic: alert & oriented X3, cranial nerves II-XII intact, strength grossly intact, sensation intact to light touch  Assessment/Plan:

## 2011-08-08 NOTE — Assessment & Plan Note (Signed)
Diabetes well-controlled with metformin and diet/exercise changes -continue current regimen -patient congratulated on her weight loss and lifestyle changes!

## 2011-08-08 NOTE — Assessment & Plan Note (Signed)
The patient has a history of hypothyroidism s/p thyroid resection 01/2011.  She was initially untreated, but was started on synthroid 2.5 months ago -will check TSH today, and adjust synthroid dose as needed

## 2011-08-08 NOTE — Assessment & Plan Note (Signed)
Patient has an appointment in 1 week for further treatment of her left kidney stone

## 2011-08-08 NOTE — Patient Instructions (Signed)
The lab results drawn today should be back early next week, and I can call you with the results.  We have called your new OB/GYN today to help you set up an appointment for your yearly pap smear.  Your diabetes is very well controlled, likely due to your diet and exercise changes, GOOD JOB!!!  Please return for a follow-up visit in 3 months.

## 2011-08-09 LAB — RPR

## 2011-08-09 LAB — HIV ANTIBODY (ROUTINE TESTING W REFLEX): HIV: NONREACTIVE

## 2011-08-11 ENCOUNTER — Other Ambulatory Visit: Payer: Self-pay | Admitting: Internal Medicine

## 2011-08-11 DIAGNOSIS — E039 Hypothyroidism, unspecified: Secondary | ICD-10-CM

## 2011-08-11 MED ORDER — LEVOTHYROXINE SODIUM 137 MCG PO TABS: 137.0000 ug | ORAL_TABLET | Freq: Every day | ORAL | Status: DC

## 2011-08-11 NOTE — Progress Notes (Signed)
TSH elevated, synthroid dose increased from 125 to 137

## 2011-08-15 ENCOUNTER — Emergency Department (HOSPITAL_COMMUNITY)
Admission: EM | Admit: 2011-08-15 | Discharge: 2011-08-16 | Disposition: A | Discharge status: Home / Self Care | Payer: Medicaid Other | Attending: Emergency Medicine | Admitting: Emergency Medicine

## 2011-08-15 ENCOUNTER — Ambulatory Visit (HOSPITAL_BASED_OUTPATIENT_CLINIC_OR_DEPARTMENT_OTHER)
Admission: RE | Admit: 2011-08-15 | Discharge: 2011-08-15 | Disposition: A | Discharge status: Home / Self Care | Payer: Medicaid Other | Source: Ambulatory Visit | Attending: Urology | Admitting: Urology

## 2011-08-15 DIAGNOSIS — D869 Sarcoidosis, unspecified: Secondary | ICD-10-CM | POA: Insufficient documentation

## 2011-08-15 DIAGNOSIS — R0602 Shortness of breath: Secondary | ICD-10-CM | POA: Insufficient documentation

## 2011-08-15 DIAGNOSIS — Z87442 Personal history of urinary calculi: Secondary | ICD-10-CM | POA: Insufficient documentation

## 2011-08-15 DIAGNOSIS — I4581 Long QT syndrome: Secondary | ICD-10-CM | POA: Insufficient documentation

## 2011-08-15 DIAGNOSIS — R109 Unspecified abdominal pain: Secondary | ICD-10-CM | POA: Insufficient documentation

## 2011-08-15 DIAGNOSIS — E119 Type 2 diabetes mellitus without complications: Secondary | ICD-10-CM | POA: Insufficient documentation

## 2011-08-15 DIAGNOSIS — N201 Calculus of ureter: Secondary | ICD-10-CM | POA: Insufficient documentation

## 2011-08-15 DIAGNOSIS — E039 Hypothyroidism, unspecified: Secondary | ICD-10-CM | POA: Insufficient documentation

## 2011-08-15 DIAGNOSIS — J45909 Unspecified asthma, uncomplicated: Secondary | ICD-10-CM | POA: Insufficient documentation

## 2011-08-15 DIAGNOSIS — Z79899 Other long term (current) drug therapy: Secondary | ICD-10-CM | POA: Insufficient documentation

## 2011-08-15 DIAGNOSIS — I10 Essential (primary) hypertension: Secondary | ICD-10-CM | POA: Insufficient documentation

## 2011-08-15 DIAGNOSIS — K219 Gastro-esophageal reflux disease without esophagitis: Secondary | ICD-10-CM | POA: Insufficient documentation

## 2011-08-15 HISTORY — PX: CYSTOSCOPY WITH RETROGRADE PYELOGRAM, URETEROSCOPY AND STENT PLACEMENT: SHX5789

## 2011-08-15 LAB — POCT I-STAT 4, (NA,K, GLUC, HGB,HCT)
Glucose, Bld: 204 mg/dL — ABNORMAL HIGH (ref 70–99)
Potassium: 4.5 mEq/L (ref 3.5–5.1)
Sodium: 140 mEq/L (ref 135–145)

## 2011-08-16 LAB — URINE MICROSCOPIC-ADD ON

## 2011-08-16 LAB — URINALYSIS, ROUTINE W REFLEX MICROSCOPIC
Bilirubin Urine: NEGATIVE
Glucose, UA: NEGATIVE mg/dL
Ketones, ur: NEGATIVE mg/dL
Nitrite: NEGATIVE
Specific Gravity, Urine: 1.002 — ABNORMAL LOW (ref 1.005–1.030)
pH: 7 (ref 5.0–8.0)

## 2011-08-22 NOTE — Op Note (Addendum)
  NAMECALLI, BASHOR              ACCOUNT NO.:  [id number]  MEDICAL RECORD NO.:  [id number]  LOCATION:  [facility]LED                         FACILITY:  [facility]  PHYSICIAN:  Danae Chen, M.D.  DATE OF BIRTH:  1965-12-14  DATE OF PROCEDURE:  08/15/2011 DATE OF DISCHARGE:                              OPERATIVE REPORT   PREOPERATIVE DIAGNOSIS:  Left distal ureteral calculus.  POSTOPERATIVE DIAGNOSIS:  No ureteral calculus.  PROCEDURE DONE:  Cystoscopy, left retrograde pyelogram, ureteroscopy, and insertion of double-J stent.  SURGEON:  Danae Chen, M.D.  ANESTHESIA:  General.  INDICATIONS:  The patient is a 45 year old female who was found on CT scan of the abdomen and pelvis on June 26, 2011, to have a 3 mm stone in the left distal ureter with mild hydronephrosis.  She has not passed a stone and she has mild suprapubic tenderness frequency.  A KUB shows a calcification in the area of the distal ureter that is probably the ureteral calculus.  Since she has not passed the stone, she was advised to have a cystoscopy and stone manipulation.  The patient was identified by her wristband and proper time-out was taken.  Under general anesthesia, she was prepped and draped and placed in the dorsal lithotomy position.  A panendoscope was inserted in the bladder. The bladder mucosa was normal.  There was no stone or tumor in the bladder.  The ureteral orifices were in normal position and shape.  RETROGRADE PYELOGRAM:  A cone-tip catheter was passed through the cystoscope and through the left ureteral orifice.  10 cc of contrast were then injected through the cone-tip catheter.  There was no evidence of filling defect in the ureter.  The ureter appears normal, there was no hydronephrosis.  Renal pelvis and calyces were normal.  The cone-tip catheter was then removed.  A sensor wire was passed through the cystoscope and through the left ureteral orifice.  Then, the cystoscope was removed.  A  semi rigid ureteroscope was then passed in the bladder and through the left ureter and advanced all the way up to the proximal ureter without evidence of ureteral calculus.  The ureteroscope was removed under direct vision and there was no evidence of stone in the ureter.  The ureteroscope was then removed.  The guidewire was then back loaded into the cystoscope and a 6-French, 26 double-J stent was passed over the guidewire, the proximal curl of the double-J stent is in the renal pelvis, the distal curl is in the bladder.  Bladder was then emptied and the cystoscope and guidewire were removed.  The patient tolerated the procedure well and left the OR in satisfactory condition to post anesthesia care unit.     Danae Chen, M.D.     MN/MEDQ  D:  08/15/2011  T:  08/16/2011  Job:  [id number]  cc:   Ileana Roup, M.D. Fax: [phone]  Electronically Signed by Lindaann Slough M.D. on 08/22/2011 01:00:50 PM

## 2011-08-26 LAB — BASIC METABOLIC PANEL
BUN: 9
CO2: 26
Calcium: 8.3 — ABNORMAL LOW
Glucose, Bld: 291 — ABNORMAL HIGH
Potassium: 3.6
Sodium: 135

## 2011-08-26 LAB — LIPID PANEL
Cholesterol: 126
HDL: 32 — ABNORMAL LOW
LDL Cholesterol: 77

## 2011-08-26 LAB — COMPREHENSIVE METABOLIC PANEL
ALT: 17
AST: 19
Alkaline Phosphatase: 66
CO2: 27
Calcium: 8.9
Chloride: 104
GFR calc Af Amer: >60
GFR calc non Af Amer: >60
Glucose, Bld: 105 — ABNORMAL HIGH
Potassium: 3.8
Sodium: 139

## 2011-08-26 LAB — CBC
Hemoglobin: 11.6 — ABNORMAL LOW
MCHC: 32.8
RBC: 4.3

## 2011-08-26 LAB — POCT I-STAT, CHEM 8
BUN: 11
Calcium, Ion: 1.06 — ABNORMAL LOW
Chloride: 104
Creatinine, Ser: 0.8
Glucose, Bld: 193 — ABNORMAL HIGH
TCO2: 28

## 2011-08-26 LAB — POCT CARDIAC MARKERS: Operator id: 234501

## 2011-08-26 LAB — CARDIAC PANEL(CRET KIN+CKTOT+MB+TROPI)
CK, MB: 0.7
Total CK: 53

## 2011-08-26 LAB — CK TOTAL AND CKMB (NOT AT ARMC): Relative Index: INVALID

## 2011-08-26 LAB — PROTIME-INR: INR: 0.9

## 2011-08-26 LAB — TSH: TSH: 0.505

## 2011-08-26 LAB — D-DIMER, QUANTITATIVE: D-Dimer, Quant: 0.34

## 2011-09-08 ENCOUNTER — Telehealth: Payer: Self-pay | Admitting: *Deleted

## 2011-09-08 NOTE — Telephone Encounter (Signed)
Thank you :)

## 2011-09-08 NOTE — Telephone Encounter (Signed)
Pt called to let us know she had a f/u appointment with Dr Brunilda Payor post  kidney stones surgery. ( three weeks ago)  Pt still has pain to low abd and rectal area. Also has constipation.  Dr Brunilda Payor suggested pt may need a colonoscopy. Pain with BM's for about 2 months.  Appointment schedule for Thursday at 1:45 for evaluation.

## 2011-09-11 ENCOUNTER — Ambulatory Visit (INDEPENDENT_AMBULATORY_CARE_PROVIDER_SITE_OTHER): Payer: Medicaid Other | Admitting: Internal Medicine

## 2011-09-11 ENCOUNTER — Encounter: Payer: Self-pay | Admitting: Internal Medicine

## 2011-09-11 VITALS — BP 131/83 | HR 92 | Temp 98.2°F | Ht 68.0 in | Wt 201.4 lb

## 2011-09-11 DIAGNOSIS — K5900 Constipation, unspecified: Secondary | ICD-10-CM

## 2011-09-11 DIAGNOSIS — R109 Unspecified abdominal pain: Secondary | ICD-10-CM

## 2011-09-11 DIAGNOSIS — Z0000 Encounter for general adult medical examination without abnormal findings: Secondary | ICD-10-CM

## 2011-09-11 NOTE — Progress Notes (Signed)
Bladder scanner done - residual urine 55-70. [facility] Jordie Schreur RN 09/11/11 3:25PM

## 2011-09-11 NOTE — Patient Instructions (Signed)
Start taking the Metamucil every day.  Take Miralax or Sennakot-S once or twice daily.  The goal is to have one soft bowel movement daily.  Follow up in about 4 weeks to see how you're feeling.

## 2011-09-11 NOTE — Progress Notes (Signed)
Subjective:   Patient ID: [name] [name] female   DOB: August 24, 1966 45 y.o.   MRN: [id number]  HPI: [name] is a 45 y.o. woman who presents to clinic today for follow up from her last appointment.  She was diagnosed with a left sided kidney stone which required a Urology consult, cystoscopy, and stent placement.  She states that the left sided back and flank pain has gotten better but now she has pain in her right groin and right flank since then.  She states that she has had lower abdominal pain for "years."  The pain is a dull pain that has moments when it is sharp and does not seem to correlate with meals, bowel movements, or movement.  She states that at its worst it is 10/10 pain and is associated with nausea.  The pain seems to start in the groin, move around to the right flank and then down into the right buttock.  She denies fevers, chills, vomiting, and diarrhea.  She does state that she has been struggling with constipation and hasn't had a bowel movement in 3 days prior to a small bowel movement the day prior to this visit.    She states that she would like to get a flu shot today.   She has several other preventative health measures to address but would like to defer them until she follows up with her PCP.    Past Medical History  Diagnosis Date  . Sarcoidosis   . OSA (obstructive sleep apnea)   . Prolonged QT interval   . Osteoporosis   . Anemia   . GERD (gastroesophageal reflux disease)   . DM (diabetes mellitus)   . Peripheral neuropathy   . HTN (hypertension)   . Nephrolithiasis   . Chronic fatigue    Current Outpatient Prescriptions  Medication Sig Dispense Refill  . amLODipine (NORVASC) 10 MG tablet Take 1 tablet (10 mg total) by mouth daily.  30 tablet  11  . aspirin 81 MG tablet Take 81 mg by mouth daily.        Marland Kitchen atorvastatin (LIPITOR) 20 MG tablet Take 20 mg by mouth daily.        . Blood Glucose Monitoring Suppl (PRODIGY BLOOD GLUCOSE MONITOR) W/DEVICE  KIT by Does not apply route. Use as indicated       . esomeprazole (NEXIUM) 40 MG capsule Take 1 capsule (40 mg total) by mouth daily before breakfast.  30 capsule  3  . ferrous sulfate 325 (65 FE) MG tablet Take 325 mg by mouth daily with breakfast.        . Fluticasone-Salmeterol (ADVAIR DISKUS) 100-50 MCG/DOSE AEPB Inhale 2 puffs into the lungs every 12 (twelve) hours.  60 each  2  . furosemide (LASIX) 40 MG tablet Take 40 mg by mouth 2 (two) times daily.        Marland Kitchen gabapentin (NEURONTIN) 300 MG capsule Take 1 capsule (300 mg total) by mouth 2 (two) times daily.  62 capsule  3  . glucose blood (ACCU-CHEK AVIVA PLUS) test strip Test as directed.  100 each  11  . INS SYRINGE/NEEDLE 1CC/28G (PRODIGY INSULIN SYRINGE) 28G X 1/2" 1 ML MISC by Does not apply route. Use as instructed to inject insulin       . levothyroxine (SYNTHROID, LEVOTHROID) 137 MCG tablet Take 1 tablet (137 mcg total) by mouth daily.  30 tablet  5  . metFORMIN (GLUCOPHAGE) 500 MG tablet Take 1 tablet (500 mg total)  by mouth 2 (two) times daily with a meal.  180 tablet  0  . methotrexate (RHEUMATREX) 2.5 MG tablet 4 tabs every thursday       . pregabalin (LYRICA) 50 MG capsule Take 1 capsule (50 mg total) by mouth 2 (two) times daily.  60 capsule  2  . PRODIGY TWIST TOP LANCETS 28G MISC by Does not apply route. Use to check blood sugars as instructed       . Tamsulosin HCl (FLOMAX) 0.4 MG CAPS Take 1 capsule (0.4 mg total) by mouth daily after supper.  30 capsule  0  . zolpidem (AMBIEN) 5 MG tablet Take 5 mg by mouth at bedtime as needed.         Family History  Problem Relation Age of Onset  . Heart disease Mother     questionable CAD and arrythmias    History   Social History  . Marital Status: Married    Spouse Name: N/A    Number of Children: N/A  . Years of Education: 12   Occupational History  .     Social History Main Topics  . Smoking status: Never Smoker   . Smokeless tobacco: None  . Alcohol Use: No  .  Drug Use: No  . Sexually Active: None   Other Topics Concern  . None   Social History Narrative   Lives in [place] with 2 sons and her husband;disabled; no use of tobacco products nor alcohol.Patient given diabetes card on 12/05/2010.   Review of Systems: Constitutional: Denies fever, chills, diaphoresis, appetite change and fatigue.  HEENT: Denies photophobia, eye pain, redness, hearing loss, ear pain, congestion, sore throat, rhinorrhea, sneezing, mouth sores, trouble swallowing, neck pain, neck stiffness and tinnitus.   Respiratory: Denies SOB, DOE, cough, chest tightness,  and wheezing.   Cardiovascular: Denies chest pain, palpitations and leg swelling.  Gastrointestinal: Denies nausea, vomiting, abdominal pain, diarrhea, constipation, blood in stool and abdominal distention.  Genitourinary: Positive for flank pain.  Denies dysuria, urgency, frequency, hematuria, and difficulty urinating.  Musculoskeletal: Positive for back pain.  Denies myalgias, joint swelling, arthralgias and gait problem.  Skin: Denies pallor, rash and wound.  Neurological: Denies dizziness, seizures, syncope, weakness, light-headedness, numbness and headaches.  Hematological: Denies adenopathy. Easy bruising, personal or family bleeding history  Psychiatric/Behavioral: Denies suicidal ideation, mood changes, confusion, nervousness, sleep disturbance and agitation  Objective:  Physical Exam: Filed Vitals:   09/11/11 1407  BP: 131/83  Pulse: 92  Temp: 98.2 F (36.8 C)  TempSrc: Oral  Height: 5\' 8"  (1.727 m)  Weight: 201 lb 6.4 oz (91.354 kg)   Constitutional: Vital signs reviewed.  Patient is a well-developed and well-nourished woman in mild distress from pain and cooperative with exam. Alert and oriented x3.  Head: Normocephalic and atraumatic Ear: TM normal bilaterally Mouth: no erythema or exudates, MMM Eyes: PERRL, EOMI, conjunctivae normal, No scleral icterus.  Neck: Supple, Trachea midline normal  ROM, No JVD, mass, thyromegaly, or carotid bruit present.  Cardiovascular: RRR, S1 normal, S2 normal, no MRG, pulses symmetric and intact bilaterally Pulmonary/Chest: CTAB, no wheezes, rales, or rhonchi Abdominal: Soft. Mild RLQ tenderness to palpation.  Moderate pain to palpation over the suprapubic area.  non-distended, bowel sounds are sluggish, no masses, organomegaly, or guarding present.  GU: no CVA tenderness Musculoskeletal: There is mild tenderness to palpation over the SI joints in the right buttock.  No pain to palpation of the greater trochanter bilaterally.  SLR is normal, Hip ROM is limited  by stiffness but no pain to PROM bilaterally.  Knee, ankle, back, and foot ROM is normal and non-tender.  No joint deformities, erythema, or stiffness, Hematology: no cervical, inginal, or axillary adenopathy.  Neurological: A&O x3, Strength is normal and symmetric bilaterally, cranial nerve II-XII are grossly intact, no focal motor deficit, sensory intact to light touch bilaterally.  Skin: Warm, dry and intact. No rash, cyanosis, or clubbing.  Psychiatric: Normal mood and affect. speech and behavior is normal. Judgment and thought content normal. Cognition and memory are normal.   Assessment & Plan:

## 2011-09-12 LAB — CBC
HCT: 36.4
HCT: 32.7 — ABNORMAL LOW
HCT: 34.4 — ABNORMAL LOW
HCT: 36.1
Hemoglobin: 11.8 — ABNORMAL LOW
Hemoglobin: 10.9 — ABNORMAL LOW
Hemoglobin: 11.4 — ABNORMAL LOW
Hemoglobin: 11.8 — ABNORMAL LOW
MCHC: 32.3
MCHC: 32.7
MCHC: 33.2
MCHC: 33.2
MCV: 82.2
MCV: 82.4
MCV: 82.5
MCV: 82.7
Platelets: 444 — ABNORMAL HIGH
Platelets: 312
Platelets: 329
Platelets: 330
RBC: 4.43
RBC: 3.97
RBC: 4.16
RBC: 4.38
RDW: 16.1 — ABNORMAL HIGH
RDW: 16.3 — ABNORMAL HIGH
RDW: 16.6 — ABNORMAL HIGH
RDW: 17 — ABNORMAL HIGH
WBC: 5.1
WBC: 12.4 — ABNORMAL HIGH
WBC: 18.9 — ABNORMAL HIGH
WBC: 6.7

## 2011-09-12 LAB — COMPREHENSIVE METABOLIC PANEL
ALT: 16
ALT: 14
ALT: 16
AST: 18
AST: 13
AST: 34
Albumin: 4.1
Albumin: 3.9
Albumin: 4
Alkaline Phosphatase: 66
Alkaline Phosphatase: 55
Alkaline Phosphatase: 56
BUN: 9
BUN: 10
BUN: 10
CO2: 28
CO2: 25
CO2: 27
Calcium: 9.2
Calcium: 9
Calcium: 9.4
Chloride: 102
Chloride: 102
Chloride: 106
Creatinine, Ser: 0.79
Creatinine, Ser: 0.67
Creatinine, Ser: 0.75
GFR calc Af Amer: >60
GFR calc Af Amer: >60
GFR calc Af Amer: >60
GFR calc non Af Amer: >60
GFR calc non Af Amer: >60
GFR calc non Af Amer: >60
Glucose, Bld: 101 — ABNORMAL HIGH
Glucose, Bld: 126 — ABNORMAL HIGH
Glucose, Bld: 203 — ABNORMAL HIGH
Potassium: 3.5
Potassium: 4
Potassium: 4.9
Sodium: 139
Sodium: 135
Sodium: 139
Total Bilirubin: 0.6
Total Bilirubin: 0.4
Total Bilirubin: 1.3 — ABNORMAL HIGH
Total Protein: 7.6
Total Protein: 7.1
Total Protein: 7.8

## 2011-09-12 LAB — URINALYSIS, ROUTINE W REFLEX MICROSCOPIC
Bilirubin Urine: NEGATIVE
Glucose, UA: NEGATIVE
Hgb urine dipstick: NEGATIVE
Ketones, ur: NEGATIVE
Nitrite: NEGATIVE
Protein, ur: NEGATIVE
Specific Gravity, Urine: 1.01
Urobilinogen, UA: 0.2
pH: 5.5

## 2011-09-12 LAB — DIFFERENTIAL
Basophils Absolute: 0
Basophils Absolute: 0
Basophils Relative: 1
Basophils Relative: 0
Eosinophils Absolute: 0.2
Eosinophils Absolute: 0.4
Eosinophils Relative: 4
Eosinophils Relative: 3
Lymphocytes Relative: 37
Lymphocytes Relative: 11 — ABNORMAL LOW
Lymphs Abs: 1.9
Lymphs Abs: 1.4
Monocytes Absolute: 0.3
Monocytes Absolute: 0.4
Monocytes Relative: 6
Monocytes Relative: 3
Neutro Abs: 2.7
Neutro Abs: 10.3 — ABNORMAL HIGH
Neutrophils Relative %: 52
Neutrophils Relative %: 83 — ABNORMAL HIGH

## 2011-09-12 LAB — CULTURE, BLOOD (ROUTINE X 2)
Culture: NO GROWTH
Culture: NO GROWTH

## 2011-09-12 LAB — GC/CHLAMYDIA PROBE AMP, GENITAL
Chlamydia, DNA Probe: NEGATIVE
GC Probe Amp, Genital: NEGATIVE

## 2011-09-12 LAB — PREGNANCY, URINE
Preg Test, Ur: NEGATIVE
Preg Test, Ur: NEGATIVE

## 2011-09-14 ENCOUNTER — Other Ambulatory Visit: Payer: Self-pay | Admitting: Internal Medicine

## 2011-09-22 DIAGNOSIS — R109 Unspecified abdominal pain: Secondary | ICD-10-CM | POA: Insufficient documentation

## 2011-09-22 NOTE — Assessment & Plan Note (Addendum)
There are several different causes of her right flank and buttock pain that we need to consider.  The pain in the suprapubic area could come from bladder source vs constipation vs obstetric complaint.  We will have her void and then perform a bladder scan to see if there is residual urine.  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  Her CT scan did show a stone on the left and an overly distended and thin walled bladder which could be a possibility.    While waiting for the bladder scan Dr. Meredith Pel and I reviewed the CT scan of the abdomen with the radiologist.  He stated that there didn't appear to be an organic cause that the CT scan showed but she did have some indication that she could have Piriformis syndrome.  The right piriformis did seem to be larger then the left and appeared to extend to the siatic notch on the right which could cause her right hip, groin, and buttock.  On further questioning she did not that she broke her left foot several months ago in a fall and had walked with a limp for sometime.    Bladder scan was negative for urinary retention as documented in Debra Ditzler's note.  See Constipation for overall plans.  If treatment of her constipation does not alleviate her symptoms we will also have her get a pelvic ultrasound to assess for gynecologic problem.

## 2011-09-23 NOTE — Assessment & Plan Note (Addendum)
She has significant constipation which could contribute to the fluctuating nature of her abdominal pain.  We will have her increase her fiber intake with metamucil as well as using Sennakot or Miralax titrated to have 1 soft BM daily and see if that helps her pain.

## 2011-09-23 NOTE — Assessment & Plan Note (Signed)
She was encouraged to make an appointment with her OB/GYn for a pap smear and have the results sent to [name] for our records.  We also discussed tetanus as well as following her lipids.  She would like to defer them to her next appointment because of her pain today.

## 2011-10-10 ENCOUNTER — Encounter: Payer: Self-pay | Admitting: Internal Medicine

## 2011-10-10 ENCOUNTER — Ambulatory Visit (INDEPENDENT_AMBULATORY_CARE_PROVIDER_SITE_OTHER): Payer: Medicaid Other | Admitting: Internal Medicine

## 2011-10-10 DIAGNOSIS — E079 Disorder of thyroid, unspecified: Secondary | ICD-10-CM

## 2011-10-10 DIAGNOSIS — K219 Gastro-esophageal reflux disease without esophagitis: Secondary | ICD-10-CM

## 2011-10-10 DIAGNOSIS — E119 Type 2 diabetes mellitus without complications: Secondary | ICD-10-CM

## 2011-10-10 DIAGNOSIS — Z23 Encounter for immunization: Secondary | ICD-10-CM

## 2011-10-10 DIAGNOSIS — E785 Hyperlipidemia, unspecified: Secondary | ICD-10-CM

## 2011-10-10 DIAGNOSIS — K5900 Constipation, unspecified: Secondary | ICD-10-CM

## 2011-10-10 DIAGNOSIS — D509 Iron deficiency anemia, unspecified: Secondary | ICD-10-CM

## 2011-10-10 DIAGNOSIS — Z0000 Encounter for general adult medical examination without abnormal findings: Secondary | ICD-10-CM

## 2011-10-10 DIAGNOSIS — I10 Essential (primary) hypertension: Secondary | ICD-10-CM

## 2011-10-10 DIAGNOSIS — D869 Sarcoidosis, unspecified: Secondary | ICD-10-CM

## 2011-10-10 DIAGNOSIS — D259 Leiomyoma of uterus, unspecified: Secondary | ICD-10-CM

## 2011-10-10 LAB — LIPID PANEL
HDL: 45 mg/dL (ref >39)
LDL Cholesterol: 87 mg/dL (ref 0–99)

## 2011-10-10 LAB — TSH: TSH: 3.512 u[IU]/mL (ref 0.350–4.500)

## 2011-10-10 LAB — GLUCOSE, CAPILLARY: Glucose-Capillary: 207 mg/dL — ABNORMAL HIGH (ref 70–99)

## 2011-10-10 MED ORDER — METFORMIN HCL 500 MG PO TABS: 500.0000 mg | ORAL_TABLET | Freq: Two times a day (BID) | ORAL | Status: DC

## 2011-10-10 NOTE — Patient Instructions (Signed)
It was nice to meet you Ms. Weilbacher.  Today you received the tetanus and whooping cough vaccination.  You received the flu shot at a prior visit.  A referral has been made to Dr. Genevive Bi of [facility] for further evaluation of your abdominal pain.  We will check your cholesterol and thyroid level today.  Adjustments may be needed to your medications, if so we will contact you. Please continue to take your medications including the iron pills.  They will help with the fatigue that you are experiencing.  We would like to evaluate your glucometer readings so please bring those back to the clinic so that we may evaluate your medications.  Return to see me after your appointment with Dr. Genevive Bi.

## 2011-10-10 NOTE — Assessment & Plan Note (Signed)
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  No neurological complaints today on exam.

## 2011-10-10 NOTE — Progress Notes (Signed)
  Subjective:    Patient ID: [name] [name], female    DOB: 1965/12/19, 45 y.o.   MRN: [id number]  HPI    Review of Systems  Constitutional: Negative.   Eyes: Negative.   Respiratory: Negative.   Cardiovascular: Negative.   Gastrointestinal: Negative for abdominal distention.  Genitourinary: Positive for flank pain. Negative for dysuria and difficulty urinating.  Musculoskeletal: Positive for back pain.  Neurological: Positive for headaches. Negative for dizziness, syncope and weakness.  Psychiatric/Behavioral: Negative.        Objective:   Physical Exam        Assessment & Plan:

## 2011-10-10 NOTE — Assessment & Plan Note (Signed)
Will get TDap today.  Pt received flu shot in October and had Pap smear per Dr. Normand Sloop recently.

## 2011-10-10 NOTE — Assessment & Plan Note (Signed)
Well controlled with amlodipine 10 mg and lasix.  Will continue and continue to monitor.

## 2011-10-10 NOTE — Assessment & Plan Note (Signed)
Reports increased fatigue lately but has not been taking Ferrous sulfate as prescribed.  Pt encouraged to resume iron therapy.

## 2011-10-10 NOTE — Assessment & Plan Note (Signed)
Continues to be an issue.  Currently taking miralax and metamucil.  Adhesions in the adbomen likely playing a role as well.

## 2011-10-10 NOTE — Assessment & Plan Note (Addendum)
Will check lipid panel today. Currently tolerating lipitor 20mg  qd.

## 2011-10-10 NOTE — Assessment & Plan Note (Signed)
Recently saw Dr. Normand Sloop of [facility] s/p pelvic u/s which showed complicated fibroid encroaching on bladder along with multiple abdominal adhesions from 4 prior midline C-sections.  Pt will be referrred to Dr. Danton Sewer of [facility] ([facility]) per Dr. Normand Sloop for further evaluation.  Pt reports continued pain and discomfort not improved with ibuprofen.

## 2011-10-10 NOTE — Assessment & Plan Note (Signed)
Reports good control with nexium.  Will continue ppi tx.

## 2011-10-10 NOTE — Assessment & Plan Note (Addendum)
Pt forgot to bring glucometer but reports blood sugar has been running high for her in the 200's.  She is requesting to be put back on glypizide instead of metformin for better control but given that her last HbgA1c 2 months ago was 6.5 which corresponds to low 120's, I am concerned that she may actually be having some low readings at night. I woill therefore not make any changes to her oral antiglycemics to day and request that she bring in her glucometer reading. Had foot exam today and has eye exam scheduled with Dr. [place].

## 2011-10-26 ENCOUNTER — Emergency Department (HOSPITAL_COMMUNITY)
Admission: EM | Admit: 2011-10-26 | Discharge: 2011-10-27 | Disposition: A | Discharge status: Home / Self Care | Payer: Medicaid Other | Attending: Emergency Medicine | Admitting: Emergency Medicine

## 2011-10-26 ENCOUNTER — Encounter (HOSPITAL_COMMUNITY): Payer: Self-pay | Admitting: *Deleted

## 2011-10-26 DIAGNOSIS — E119 Type 2 diabetes mellitus without complications: Secondary | ICD-10-CM | POA: Insufficient documentation

## 2011-10-26 DIAGNOSIS — I10 Essential (primary) hypertension: Secondary | ICD-10-CM | POA: Insufficient documentation

## 2011-10-26 DIAGNOSIS — Z79899 Other long term (current) drug therapy: Secondary | ICD-10-CM | POA: Insufficient documentation

## 2011-10-26 DIAGNOSIS — M810 Age-related osteoporosis without current pathological fracture: Secondary | ICD-10-CM | POA: Insufficient documentation

## 2011-10-26 DIAGNOSIS — K219 Gastro-esophageal reflux disease without esophagitis: Secondary | ICD-10-CM | POA: Insufficient documentation

## 2011-10-26 DIAGNOSIS — G4733 Obstructive sleep apnea (adult) (pediatric): Secondary | ICD-10-CM | POA: Insufficient documentation

## 2011-10-26 DIAGNOSIS — R109 Unspecified abdominal pain: Secondary | ICD-10-CM | POA: Insufficient documentation

## 2011-10-26 NOTE — ED Notes (Signed)
Pt states that she has been having pain in her right flank since the 21 of 10/2011.  Pt stating that the pain is similar to that of kidney stones she has had in the past and she feels as if her c-section scar is hurting as well.

## 2011-10-27 ENCOUNTER — Emergency Department (HOSPITAL_COMMUNITY): Payer: Medicaid Other

## 2011-10-27 ENCOUNTER — Encounter (HOSPITAL_COMMUNITY): Payer: Self-pay | Admitting: *Deleted

## 2011-10-27 LAB — DIFFERENTIAL
Eosinophils Relative: 1 % (ref 0–5)
Lymphocytes Relative: 27 % (ref 12–46)
Lymphs Abs: 1.7 10*3/uL (ref 0.7–4.0)
Monocytes Absolute: 0.5 10*3/uL (ref 0.1–1.0)
Monocytes Relative: 8 % (ref 3–12)
Neutro Abs: 3.9 10*3/uL (ref 1.7–7.7)

## 2011-10-27 LAB — COMPREHENSIVE METABOLIC PANEL
Albumin: 3.8 g/dL (ref 3.5–5.2)
Alkaline Phosphatase: 78 U/L (ref 39–117)
BUN: 14 mg/dL (ref 6–23)
CO2: 29 mEq/L (ref 19–32)
Chloride: 99 mEq/L (ref 96–112)
Creatinine, Ser: 0.7 mg/dL (ref 0.50–1.10)
GFR calc non Af Amer: >90 mL/min (ref >90)
Potassium: 4.4 mEq/L (ref 3.5–5.1)
Total Bilirubin: 0.1 mg/dL — ABNORMAL LOW (ref 0.3–1.2)

## 2011-10-27 LAB — LIPASE, BLOOD: Lipase: 79 U/L — ABNORMAL HIGH (ref 11–59)

## 2011-10-27 LAB — URINALYSIS, ROUTINE W REFLEX MICROSCOPIC
Bilirubin Urine: NEGATIVE
Glucose, UA: >1000 mg/dL — AB
Hgb urine dipstick: NEGATIVE
Ketones, ur: NEGATIVE mg/dL
Nitrite: NEGATIVE
Specific Gravity, Urine: 1.03 (ref 1.005–1.030)
pH: 7.5 (ref 5.0–8.0)

## 2011-10-27 LAB — CBC
HCT: 38.2 % (ref 36.0–46.0)
Hemoglobin: 12.5 g/dL (ref 12.0–15.0)
MCV: 85.5 fL (ref 78.0–100.0)
RBC: 4.47 MIL/uL (ref 3.87–5.11)
RDW: 13.6 % (ref 11.5–15.5)
WBC: 6.2 10*3/uL (ref 4.0–10.5)

## 2011-10-27 LAB — URINE MICROSCOPIC-ADD ON

## 2011-10-27 MED ORDER — HYDROMORPHONE HCL PF 1 MG/ML IJ SOLN: 1.0000 mg | Freq: Once | INTRAMUSCULAR | Status: DC

## 2011-10-27 MED ORDER — SODIUM CHLORIDE 0.9 % IV SOLN
INTRAVENOUS | Status: DC
  Administered 2011-10-27 (×3): via INTRAVENOUS

## 2011-10-27 MED ORDER — MORPHINE SULFATE 4 MG/ML IJ SOLN
4.0000 mg | Freq: Once | INTRAMUSCULAR | Status: AC
  Administered 2011-10-27: 4 mg via INTRAVENOUS
  Filled 2011-10-27: qty 1

## 2011-10-27 MED ORDER — ONDANSETRON HCL 4 MG/2ML IJ SOLN
4.0000 mg | Freq: Once | INTRAMUSCULAR | Status: AC
  Administered 2011-10-27: 4 mg via INTRAVENOUS
  Filled 2011-10-27: qty 2

## 2011-10-27 MED ORDER — HYDROMORPHONE HCL PF 2 MG/ML IJ SOLN
INTRAMUSCULAR | Status: AC
  Administered 2011-10-27: 1 mg via INTRAVENOUS
  Filled 2011-10-27: qty 1

## 2011-10-27 MED ORDER — IOHEXOL 300 MG/ML  SOLN
100.0000 mL | Freq: Once | INTRAMUSCULAR | Status: AC | PRN
  Administered 2011-10-27: 100 mL via INTRAVENOUS

## 2011-10-27 MED ORDER — OXYCODONE-ACETAMINOPHEN 5-325 MG PO TABS: 2.0000 | ORAL_TABLET | ORAL | Status: AC | PRN

## 2011-10-27 NOTE — ED Provider Notes (Signed)
History     CSN: [id number] Arrival date & time: 10/26/2011 11:53 PM   First MD Initiated Contact with Patient 10/26/11 2354    The patient is a 45 year old, female, with a history of kidney stones, and C-section, who presents to the emergency department with right lower abdominal pain that radiates to her back.  This pain has been present since last Wednesday.  It is associated with nausea and has not been vomiting, diarrhea, urinary tract symptoms, or hematuria.  The pain does not change with bowel movements or food.  She denies cough, shortness of breath, fevers, chills.  Her last menstrual period was last month and was normal.  She does have a history of ovarian cysts and fibroids.  She denies vaginal bleeding or discharge.  Level V caveat for severe pain and urgent need for intervention.  eg analgesic  Chief Complaint  Patient presents with  . Nephrolithiasis     The history is provided by the patient.    Past Medical History  Diagnosis Date  . Sarcoidosis   . OSA (obstructive sleep apnea)   . Prolonged QT interval   . Osteoporosis   . Anemia   . GERD (gastroesophageal reflux disease)   . DM (diabetes mellitus)   . Peripheral neuropathy   . HTN (hypertension)   . Nephrolithiasis   . Chronic fatigue     Past Surgical History  Procedure Date  . Hysteroscopy   . Dilation and curettage of uterus     Family History  Problem Relation Age of Onset  . Heart disease Mother     questionable CAD and arrythmias     History  Substance Use Topics  . Smoking status: Never Smoker   . Smokeless tobacco: Not on file  . Alcohol Use: No    OB History    Grav Para Term Preterm Abortions TAB SAB Ect Mult Living                  Review of Systems  Constitutional: Negative for fever, chills and diaphoresis.  HENT: Negative for congestion and neck pain.   Eyes: Negative for redness.  Respiratory: Negative for cough, chest tightness and shortness of breath.     Cardiovascular: Negative for chest pain.  Gastrointestinal: Positive for nausea and abdominal pain. Negative for vomiting, diarrhea, constipation and abdominal distention.  Genitourinary: Positive for flank pain. Negative for dysuria, urgency, vaginal bleeding, vaginal discharge, vaginal pain and pelvic pain.  Musculoskeletal: Negative for back pain.  Skin: Negative for rash.  Neurological: Negative for light-headedness, numbness and headaches.  Psychiatric/Behavioral: Negative for confusion.    Allergies  Review of patient's allergies indicates no known allergies.  Home Medications   Current Outpatient Rx  Name Route Sig Dispense Refill  . AMLODIPINE BESYLATE 10 MG PO TABS Oral Take 1 tablet (10 mg total) by mouth daily. 30 tablet 11    Please note that the dose of the tablets have chan ...  . ASPIRIN 81 MG PO TABS Oral Take 81 mg by mouth daily.      . ATORVASTATIN CALCIUM 20 MG PO TABS Oral Take 20 mg by mouth daily.     Marland Kitchen PRODIGY BLOOD GLUCOSE MONITOR W/DEVICE KIT Does not apply by Does not apply route. Use as indicated     . FERROUS SULFATE 325 (65 FE) MG PO TABS Oral Take 325 mg by mouth daily with breakfast.      . FLUTICASONE-SALMETEROL 100-50 MCG/DOSE IN AEPB Inhalation Inhale 2 puffs  into the lungs every 12 (twelve) hours. 60 each 2  . FUROSEMIDE 40 MG PO TABS Oral Take 40 mg by mouth 2 (two) times daily.      Marland Kitchen GABAPENTIN 300 MG PO CAPS Oral Take 1 capsule (300 mg total) by mouth 2 (two) times daily. 62 capsule 3  . GLUCOSE BLOOD VI STRP  Test as directed. 100 each 11  . INSULIN SYRINGE/NEEDLE 28G X 1/2" 1 ML MISC Does not apply by Does not apply route. Use as instructed to inject insulin     . LEVOTHYROXINE SODIUM 137 MCG PO TABS Oral Take 1 tablet (137 mcg total) by mouth daily. 30 tablet 5  . METFORMIN HCL 500 MG PO TABS Oral Take 1 tablet (500 mg total) by mouth 2 (two) times daily with a meal. 180 tablet 11  . METHOTREXATE 2.5 MG PO TABS  4 tabs every thursday     .  NEXIUM 40 MG PO CPDR  TAKE ONE CAPSULE BY MOUTH EVERY DAY BEFORE BREAKFAST 30 capsule 11  . PREGABALIN 50 MG PO CAPS Oral Take 1 capsule (50 mg total) by mouth 2 (two) times daily. 60 capsule 2  . PRODIGY TWIST TOP LANCETS 28G MISC Does not apply by Does not apply route. Use to check blood sugars as instructed     . ZOLPIDEM TARTRATE 5 MG PO TABS Oral Take 5 mg by mouth at bedtime as needed.        BP 156/74  Pulse 75  Temp(Src) 98.6 F (37 C) (Oral)  Resp 18  SpO2 99%  LMP 10/09/2011  Physical Exam  Vitals reviewed. Constitutional: She is oriented to person, place, and time. She appears well-developed and well-nourished.       In severe discomfort  HENT:  Head: Normocephalic and atraumatic.  Eyes: Pupils are equal, round, and reactive to light.  Neck: Normal range of motion.  Cardiovascular: Normal rate, regular rhythm and normal heart sounds.   No murmur heard. Pulmonary/Chest: Effort normal and breath sounds normal. No respiratory distress. She has no wheezes. She has no rales.  Abdominal: Soft. She exhibits no distension and no mass. There is tenderness. There is guarding. There is no rebound.       Right lower quadrant tenderness, with guarding Mild right upper quadrant tenderness, with no guarding  Musculoskeletal: Normal range of motion. She exhibits no edema and no tenderness.  Neurological: She is alert and oriented to person, place, and time. No cranial nerve deficit.  Skin: Skin is warm and dry. No rash noted. No erythema.  Psychiatric: She has a normal mood and affect. Her behavior is normal.    ED Course  Procedures (including critical care time)  45 year old, female, with a past history of kidney stones, and prior abdominal surgery, complains of right lower abdominal pain for several days, associated with nausea, and no other symptoms.  She is in severe discomfort.  She has significant tenderness in the right lower abdomen with guarding.  I will perform laboratory  testing, and begin with an acute abdominal series to look for an obstruction that is negative.  We will proceed to a CAT scan of her abdomen.  We'll establish an IV and provide analgesics for her symptoms.   Labs Reviewed  CBC  DIFFERENTIAL  URINALYSIS, ROUTINE W REFLEX MICROSCOPIC  COMPREHENSIVE METABOLIC PANEL  LIPASE, BLOOD     4:11 AM Pain improved but increasing again  5:04 AM Pain controlled   MDM  Abdominal pain  Nicholes Stairs, MD 10/27/11 [phone]

## 2011-11-05 ENCOUNTER — Telehealth: Payer: Self-pay | Admitting: *Deleted

## 2011-11-05 NOTE — Telephone Encounter (Signed)
Call made to [facility] [phone] to initiate prior authorization on pt's  nexium 40mg  daily.  Per notes in EMR, pt has tried and failed omeprazole, but insurance requires her to fail 2 PPIs.  She will need to try prevacid otc or protonix.  If she fails therapy, then insurance will be willing to pay for the nexium. Will information to PCP and inform patient that there may be a change in her meds.Kingsley Spittle Cassady12/5/201212:26 PM

## 2011-11-13 ENCOUNTER — Other Ambulatory Visit: Payer: Self-pay | Admitting: Internal Medicine

## 2011-11-13 DIAGNOSIS — K219 Gastro-esophageal reflux disease without esophagitis: Secondary | ICD-10-CM

## 2011-11-13 MED ORDER — PANTOPRAZOLE SODIUM 40 MG PO TBEC: 40.0000 mg | DELAYED_RELEASE_TABLET | Freq: Every day | ORAL | Status: DC

## 2011-11-13 NOTE — Telephone Encounter (Signed)
I have added Protonix 40mg  qd to her med list and sent into her pharmacy.

## 2011-12-02 DIAGNOSIS — C801 Malignant (primary) neoplasm, unspecified: Secondary | ICD-10-CM

## 2011-12-02 HISTORY — PX: PARTIAL HYSTERECTOMY: SHX80

## 2011-12-02 HISTORY — DX: Malignant (primary) neoplasm, unspecified: C80.1

## 2012-02-14 ENCOUNTER — Other Ambulatory Visit: Payer: Self-pay | Admitting: Internal Medicine

## 2012-03-17 ENCOUNTER — Ambulatory Visit (INDEPENDENT_AMBULATORY_CARE_PROVIDER_SITE_OTHER): Payer: Medicaid Other | Admitting: Internal Medicine

## 2012-03-17 ENCOUNTER — Encounter: Payer: Self-pay | Admitting: Internal Medicine

## 2012-03-17 VITALS — BP 143/88 | HR 101 | Temp 97.1°F | Ht 68.0 in | Wt 199.2 lb

## 2012-03-17 DIAGNOSIS — D259 Leiomyoma of uterus, unspecified: Secondary | ICD-10-CM

## 2012-03-17 DIAGNOSIS — I10 Essential (primary) hypertension: Secondary | ICD-10-CM

## 2012-03-17 DIAGNOSIS — D869 Sarcoidosis, unspecified: Secondary | ICD-10-CM

## 2012-03-17 DIAGNOSIS — Z79899 Other long term (current) drug therapy: Secondary | ICD-10-CM

## 2012-03-17 DIAGNOSIS — K6289 Other specified diseases of anus and rectum: Secondary | ICD-10-CM

## 2012-03-17 DIAGNOSIS — E119 Type 2 diabetes mellitus without complications: Secondary | ICD-10-CM

## 2012-03-17 LAB — POCT GLYCOSYLATED HEMOGLOBIN (HGB A1C): Hemoglobin A1C: 7.4

## 2012-03-17 LAB — GLUCOSE, CAPILLARY: Glucose-Capillary: 119 mg/dL — ABNORMAL HIGH (ref 70–99)

## 2012-03-17 MED ORDER — FUROSEMIDE 40 MG PO TABS: 40.0000 mg | ORAL_TABLET | Freq: Two times a day (BID) | ORAL | Status: DC

## 2012-03-17 MED ORDER — GLIPIZIDE 10 MG PO TABS: 5.0000 mg | ORAL_TABLET | Freq: Two times a day (BID) | ORAL | Status: DC

## 2012-03-17 NOTE — Assessment & Plan Note (Signed)
Patient reports rectal pain that has been ongoing for over one year. Patient reports that rectal pain has intensified in nature after hysterectomy in February 2013. Patient denies blood in stool however reports that rectal pain is relieved with defecation. Patient denies constipation. No obvious hemorrhoids, anal lesions, fissures present. Unknown etiology at this point but given duration of symptoms, would refer to GI for further evaluation, as patient may need colonoscopy. Of note, no family history of colon cancer. May also be a complication of hysterectomy, advised patient to discuss this problem with her OB/GYN who performed her surgery.

## 2012-03-17 NOTE — Assessment & Plan Note (Signed)
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  CBC:    Component Value Date/Time   WBC 6.2 10/27/2011 0022   HGB 12.5 10/27/2011 0022   HCT 38.2 10/27/2011 0022   PLT 286 10/27/2011 0022   MCV 85.5 10/27/2011 0022   NEUTROABS 3.9 10/27/2011 0022   LYMPHSABS 1.7 10/27/2011 0022   MONOABS 0.5 10/27/2011 0022   EOSABS 0.1 10/27/2011 0022   BASOSABS 0.0 10/27/2011 0022   No active symptoms, will check CMET and CBC as patient is on methotrexate.

## 2012-03-17 NOTE — Patient Instructions (Signed)
Please follow up with gastroenterology as scheduled. Please continue to take all medications as directed. Please follow up in 3 months.

## 2012-03-17 NOTE — Assessment & Plan Note (Signed)
Lab Results  Component Value Date   HGBA1C 7.4 03/17/2012   HGBA1C 7.3 09/26/2010   CREATININE 0.70 10/27/2011   CREATININE 0.81 06/30/2011   MICROALBUR 0.63 10/21/2010   MICRALBCREAT 4.5 10/21/2010   CHOL 149 10/10/2011   HDL 45 10/10/2011   TRIG 87 10/10/2011    Last eye exam and foot exam:    Component Value Date/Time   HMDIABEYEEXA neg for diabetic retinopathy 10/04/2010   HMDIABFOOTEX done 09/26/2010    Assessment: Diabetes control: controlled Progress toward goals: slightly worse as last A1C 6.5 back in Sept. 2012 Barriers to meeting goals: no barriers identified  Plan: Diabetes treatment: continue current medications Start glipizide Refer to: none Instruction/counseling given: reminded to get eye exam, reminded to bring blood glucose meter & log to each visit, reminded to bring medications to each visit, discussed the need for weight loss and discussed diet

## 2012-03-17 NOTE — Progress Notes (Signed)
Subjective:     Patient ID: [name] [name], female   DOB: 11-26-1966, 46 y.o.   MRN: [id number]  HPI  Ms. 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 Patient complains of rectal pain that is worse before defecation. Pain is there even when sitting, and not there when she walks. Pain is described as dull ache and relieved after defecation. Reports that pain started shortly after hysterectomy. No blood in stool, and denies constipation. Denies history of hemorrhoids. Did not address with surgeon at [facility] because she missed her appt.   2.) DM - Takes metformin only. CBGs are usually between 100-170. No hypoglycemic episodes such as dizziness or lightheaded.   Patient has no other complaints or concerns today. She denies chest pain, cough, sob, headache, N/V, changes in abdominal and urinary character.     Review of Systems  Gastrointestinal: Positive for rectal pain. Negative for diarrhea, constipation and blood in stool.  Genitourinary: Negative for vaginal bleeding and difficulty urinating.       Objective:   Physical Exam  Constitutional: She appears well-developed.  HENT:  Head: Normocephalic.  Neck: Neck supple.  Cardiovascular: Normal rate and regular rhythm.   Pulmonary/Chest: Effort normal.  Abdominal: Soft. Bowel sounds are normal. She exhibits no distension and no mass. There is no tenderness. There is no guarding.  Genitourinary:       No hemorrhoids or rectal lesions present FOBT negative

## 2012-03-17 NOTE — Assessment & Plan Note (Signed)
Lab Results  Component Value Date   NA 137 10/27/2011   K 4.4 10/27/2011   CL 99 10/27/2011   CO2 29 10/27/2011   BUN 14 10/27/2011   CREATININE 0.70 10/27/2011   CREATININE 0.81 06/30/2011    BP Readings from Last 3 Encounters:  03/17/12 143/88  10/27/11 122/76  10/10/11 123/79    Assessment: Hypertension control:  mildly elevated  Progress toward goals:  at goal Barriers to meeting goals:  no barriers identified  Plan: Hypertension treatment:  continue current medications

## 2012-03-18 LAB — CBC
HCT: 39.1 % (ref 36.0–46.0)
Hemoglobin: 11.8 g/dL — ABNORMAL LOW (ref 12.0–15.0)
RBC: 4.36 MIL/uL (ref 3.87–5.11)

## 2012-03-18 LAB — COMPREHENSIVE METABOLIC PANEL
ALT: 18 U/L (ref 0–35)
AST: 18 U/L (ref 0–37)
Alkaline Phosphatase: 57 U/L (ref 39–117)
Creat: 0.72 mg/dL (ref 0.50–1.10)
Potassium: 4.5 mEq/L (ref 3.5–5.3)
Sodium: 141 mEq/L (ref 135–145)
Total Bilirubin: 0.3 mg/dL (ref 0.3–1.2)

## 2012-04-11 ENCOUNTER — Other Ambulatory Visit: Payer: Self-pay | Admitting: Internal Medicine

## 2012-04-12 ENCOUNTER — Telehealth: Payer: Self-pay | Admitting: Cardiology

## 2012-04-12 NOTE — Telephone Encounter (Signed)
New Problem:     I called the patient and was unable to reach them. I left a message on their voicemail with my name, the reason I called, the name of his physician, and a number to call back to schedule their appointment. 

## 2012-06-01 ENCOUNTER — Encounter: Payer: Self-pay | Admitting: Internal Medicine

## 2012-06-01 ENCOUNTER — Ambulatory Visit (INDEPENDENT_AMBULATORY_CARE_PROVIDER_SITE_OTHER): Payer: Medicaid Other | Admitting: Internal Medicine

## 2012-06-01 VITALS — BP 125/79 | HR 75 | Temp 97.3°F | Ht 68.0 in | Wt 194.1 lb

## 2012-06-01 DIAGNOSIS — I10 Essential (primary) hypertension: Secondary | ICD-10-CM

## 2012-06-01 DIAGNOSIS — Z79899 Other long term (current) drug therapy: Secondary | ICD-10-CM

## 2012-06-01 DIAGNOSIS — E785 Hyperlipidemia, unspecified: Secondary | ICD-10-CM

## 2012-06-01 DIAGNOSIS — D869 Sarcoidosis, unspecified: Secondary | ICD-10-CM

## 2012-06-01 DIAGNOSIS — E039 Hypothyroidism, unspecified: Secondary | ICD-10-CM

## 2012-06-01 DIAGNOSIS — E119 Type 2 diabetes mellitus without complications: Secondary | ICD-10-CM

## 2012-06-01 DIAGNOSIS — K219 Gastro-esophageal reflux disease without esophagitis: Secondary | ICD-10-CM

## 2012-06-01 MED ORDER — ESOMEPRAZOLE MAGNESIUM 40 MG PO CPDR: 40.0000 mg | DELAYED_RELEASE_CAPSULE | Freq: Every day | ORAL | Status: DC

## 2012-06-01 NOTE — Patient Instructions (Signed)
Try Protonix about 20 minutes before your largest meal. If doesn't get better, please let us know.  We will see if insurance coves Nexium Your diabetes and cholesterol is great.

## 2012-06-01 NOTE — Assessment & Plan Note (Addendum)
P t complains of a HA today and that that it is the manifest  ion of her neurosarcoidosis. She will F/U with neuro. Cont on her MTX.

## 2012-06-01 NOTE — Assessment & Plan Note (Signed)
Lab Results  Component Value Date   HGBA1C 6.3 06/01/2012   Her A1C is excellent! She is on Metformin and Glipizide. She just saw optho at [place] and I will request her records to update her HM. She needs a microalb - no recent and not on ACEI. Otherwise, all other DM HM are UTD.

## 2012-06-01 NOTE — Progress Notes (Signed)
  Subjective:    Patient ID: [name] [name], female    DOB: Apr 03, 1966, 46 y.o.   MRN: [id number]  HPI  Please see the A&P for the status of the pt's chronic medical problems.   Review of Systems  Constitutional: Positive for appetite change and unexpected weight change.  HENT: Negative for sore throat and trouble swallowing.   Gastrointestinal: Positive for nausea, vomiting, abdominal pain and rectal pain. Negative for diarrhea, constipation and blood in stool.  Neurological: Positive for headaches.  Psychiatric/Behavioral: Positive for disturbed wake/sleep cycle.       Objective:   Physical Exam  Constitutional: She is oriented to person, place, and time. She appears well-developed and well-nourished. No distress.  HENT:  Head: Normocephalic and atraumatic.  Right Ear: External ear normal.  Left Ear: External ear normal.  Nose: Nose normal.  Eyes: Conjunctivae and EOM are normal.  Abdominal: Soft. She exhibits no distension. There is no tenderness. There is no rebound.       Hypoactive bowel sounds  Neurological: She is alert and oriented to person, place, and time.  Skin: Skin is warm and dry. She is not diaphoretic.  Psychiatric: She has a normal mood and affect. Her behavior is normal. Judgment and thought content normal.          Assessment & Plan:

## 2012-06-01 NOTE — Assessment & Plan Note (Signed)
BP Readings from Last 3 Encounters:  06/01/12 125/79  03/17/12 143/88  10/27/11 122/76   Her BP is well controlled on her Norvasc and lasix BID. I reviewed prior notes and these is no inidcation as to why this regimen was chosen. I am checking a microalb and if elevation and remains elevated on repeated checks, she would need addition or substitution of an ACEI. Last BMP was only 2 months ago and good.

## 2012-06-01 NOTE — Assessment & Plan Note (Signed)
Reviewed her last lipid panel and she is well controlled on her statin. Cont and check FLP at her net visit.

## 2012-06-01 NOTE — Assessment & Plan Note (Signed)
Reviewed the results of her last TSH with her and it was within normal limits. I do not need to draw any other labs today so this can be rechecked at her next visit. She cont on her synthroid.

## 2012-06-01 NOTE — Assessment & Plan Note (Signed)
Pt's GERD sxs have been increasing in freq and severity for the past two weeks. She has decreased appetite 2/2 the sxs but gets hungry and eats meal at 5-7 PM. She takes her PPI at 7-8 PM. She can then feel, esp after lying down to go to sleep, a sour, burning sensation moving from her stomach to her throat. It tastes bad. She then throws up. She has lost about 5 lbs due to not wanting to eat 2/2 the sxs. She feels her sxs were better controlled when she was on Nexium but medicaid necessitated a change to protonix apparently. She denies dysphagia, gastroparesis type sxs (food not moving through GI tract, esp after fatty, heavy meal). She has never had an EGD.  She will take her protonix about 20 minutes prior to her biggest mean. I sent an Rx for nexium to the pharmacy but explained medicaid will likely not cover this even with pre-auth. If sxs progress or do not get better, she will need a GI referral for EGD.

## 2012-06-02 LAB — MICROALBUMIN / CREATININE URINE RATIO
Creatinine, Urine: 49.8 mg/dL
Microalb Creat Ratio: 10 mg/g (ref 0.0–30.0)

## 2012-06-02 NOTE — Progress Notes (Signed)
Micro alb is normal. PCP to decide on BP meds.

## 2012-06-07 ENCOUNTER — Encounter: Payer: Self-pay | Admitting: Cardiology

## 2012-06-07 ENCOUNTER — Ambulatory Visit (INDEPENDENT_AMBULATORY_CARE_PROVIDER_SITE_OTHER): Payer: Medicaid Other | Admitting: Cardiology

## 2012-06-07 VITALS — BP 129/85 | HR 90 | Ht 68.0 in | Wt 195.0 lb

## 2012-06-07 DIAGNOSIS — I10 Essential (primary) hypertension: Secondary | ICD-10-CM

## 2012-06-07 DIAGNOSIS — R079 Chest pain, unspecified: Secondary | ICD-10-CM

## 2012-06-07 DIAGNOSIS — E785 Hyperlipidemia, unspecified: Secondary | ICD-10-CM

## 2012-06-07 DIAGNOSIS — R9431 Abnormal electrocardiogram [ECG] [EKG]: Secondary | ICD-10-CM

## 2012-06-07 NOTE — Progress Notes (Signed)
HPI: [name] is a pleasant female with long history of atypical chest pain, sarcoidosis, diabetes, and previously diagnosed long QT. Her last Myoview was performed in May of 2012. At that time, she was found to have an ejection fraction of 49% with a small apical defect  Most likely from soft tissue attenuation. Small prior infarct could not be excluded. Echocardiogram in July 2005 showed normal LV function. I last saw her in April of 2012. Since then she has brief chest pain which is unchanged. It is in the left breast area. It lasts 1-2 seconds and resolves spontaneously. The pain is not exertional. There is no dyspnea or syncope. Occasional mild pedal edema.   Current Outpatient Prescriptions  Medication Sig Dispense Refill  . amLODipine (NORVASC) 10 MG tablet TAKE 1 TABLET (10 MG TOTAL) BY MOUTH DAILY.  30 tablet  11  . aspirin 81 MG tablet Take 81 mg by mouth daily.        Marland Kitchen atorvastatin (LIPITOR) 20 MG tablet TAKE 1 TABLET EVERY DAY  31 tablet  10  . ferrous sulfate 325 (65 FE) MG tablet Take 325 mg by mouth daily with breakfast.        . Fluticasone-Salmeterol (ADVAIR DISKUS) 100-50 MCG/DOSE AEPB Inhale 2 puffs into the lungs every 12 (twelve) hours.  60 each  2  . furosemide (LASIX) 40 MG tablet Take 1 tablet (40 mg total) by mouth 2 (two) times daily.  30 tablet  3  . gabapentin (NEURONTIN) 300 MG capsule Take 1 capsule (300 mg total) by mouth 2 (two) times daily.  62 capsule  3  . glipiZIDE (GLUCOTROL) 10 MG tablet Take 0.5 tablets (5 mg total) by mouth 2 (two) times daily.  60 tablet  11  . levothyroxine (SYNTHROID, LEVOTHROID) 137 MCG tablet TAKE 1 TABLET EVERY DAY  30 tablet  5  . metFORMIN (GLUCOPHAGE) 500 MG tablet Take 1 tablet (500 mg total) by mouth 2 (two) times daily with a meal.  180 tablet  11  . methotrexate (RHEUMATREX) 2.5 MG tablet 4 tabs every thursday       . pantoprazole (PROTONIX) 40 MG tablet Take 1 tablet (40 mg total) by mouth daily.  30 tablet  6  . zolpidem  (AMBIEN) 5 MG tablet Take 5 mg by mouth at bedtime as needed.           Past Medical History  Diagnosis Date  . Sarcoidosis   . OSA (obstructive sleep apnea)   . Prolonged QT interval   . Osteoporosis   . Anemia   . GERD (gastroesophageal reflux disease)   . DM (diabetes mellitus)   . Peripheral neuropathy   . HTN (hypertension)   . Nephrolithiasis   . Chronic fatigue     Past Surgical History  Procedure Date  . Hysteroscopy   . Dilation and curettage of uterus   . Partial hysterectomy 2013    History   Social History  . Marital Status: Married    Spouse Name: N/A    Number of Children: N/A  . Years of Education: 12   Occupational History  .     Social History Main Topics  . Smoking status: Never Smoker   . Smokeless tobacco: Not on file  . Alcohol Use: No  . Drug Use: No  . Sexually Active: Not on file   Other Topics Concern  . Not on file   Social History Narrative   Lives in [place] with 2 sons  and her husband;disabled; no use of tobacco products nor alcohol.Patient given diabetes card on 12/05/2010.    ROS: no fevers or chills, productive cough, hemoptysis, dysphasia, odynophagia, melena, hematochezia, dysuria, hematuria, rash, seizure activity, orthopnea, PND, pedal edema, claudication. Remaining systems are negative.  Physical Exam: Well-developed well-nourished in no acute distress.  Skin is warm and dry.  HEENT is normal.  Neck is supple. Chest is clear to auscultation with normal expansion.  Cardiovascular exam is regular rate and rhythm.  Abdominal exam nontender or distended. No masses palpated. Extremities show no edema. neuro grossly intact  ECG sinus rhythm at a rate of 87. Axis normal. Nonspecific ST changes to

## 2012-06-07 NOTE — Assessment & Plan Note (Signed)
Continue statin. Lipids and liver monitored by primary care. 

## 2012-06-07 NOTE — Assessment & Plan Note (Signed)
Blood pressure controlled. Continue present medications. 

## 2012-06-07 NOTE — Assessment & Plan Note (Signed)
No history of syncope. ?

## 2012-06-07 NOTE — Patient Instructions (Addendum)
Your physician wants you to follow-up in: ONE YEAR WITH DR CRENSHAW You will receive a reminder letter in the mail two months in advance. If you don't receive a letter, please call our office to schedule the follow-up appointment.  

## 2012-06-07 NOTE — Assessment & Plan Note (Signed)
Symptoms are atypical. Last Myoview showed no ischemia. No further evaluation at this time. Note her symptoms are chronic.

## 2012-07-14 ENCOUNTER — Other Ambulatory Visit: Payer: Self-pay | Admitting: Internal Medicine

## 2012-07-14 DIAGNOSIS — Z1231 Encounter for screening mammogram for malignant neoplasm of breast: Secondary | ICD-10-CM

## 2012-07-28 ENCOUNTER — Other Ambulatory Visit: Payer: Self-pay | Admitting: *Deleted

## 2012-07-28 DIAGNOSIS — I10 Essential (primary) hypertension: Secondary | ICD-10-CM

## 2012-07-31 MED ORDER — FUROSEMIDE 40 MG PO TABS: 40.0000 mg | ORAL_TABLET | Freq: Two times a day (BID) | ORAL | Status: DC

## 2012-08-09 ENCOUNTER — Ambulatory Visit (HOSPITAL_COMMUNITY)
Admission: RE | Admit: 2012-08-09 | Discharge: 2012-08-09 | Disposition: A | Discharge status: Home / Self Care | Payer: Medicaid Other | Source: Ambulatory Visit | Attending: Emergency Medicine | Admitting: Emergency Medicine

## 2012-08-09 DIAGNOSIS — Z1231 Encounter for screening mammogram for malignant neoplasm of breast: Secondary | ICD-10-CM | POA: Insufficient documentation

## 2012-08-11 ENCOUNTER — Other Ambulatory Visit: Payer: Self-pay | Admitting: Emergency Medicine

## 2012-08-11 DIAGNOSIS — R928 Other abnormal and inconclusive findings on diagnostic imaging of breast: Secondary | ICD-10-CM

## 2012-08-13 ENCOUNTER — Ambulatory Visit
Admission: RE | Admit: 2012-08-13 | Discharge: 2012-08-13 | Disposition: A | Discharge status: Home / Self Care | Payer: Medicaid Other | Source: Ambulatory Visit | Attending: Emergency Medicine | Admitting: Emergency Medicine

## 2012-08-13 DIAGNOSIS — R928 Other abnormal and inconclusive findings on diagnostic imaging of breast: Secondary | ICD-10-CM

## 2012-08-29 ENCOUNTER — Other Ambulatory Visit: Payer: Self-pay | Admitting: Internal Medicine

## 2012-09-13 ENCOUNTER — Ambulatory Visit (INDEPENDENT_AMBULATORY_CARE_PROVIDER_SITE_OTHER): Payer: Medicaid Other | Admitting: Internal Medicine

## 2012-09-13 ENCOUNTER — Encounter: Payer: Self-pay | Admitting: Internal Medicine

## 2012-09-13 VITALS — BP 119/74 | HR 80 | Temp 97.2°F | Ht 68.0 in | Wt 200.6 lb

## 2012-09-13 DIAGNOSIS — R5383 Other fatigue: Secondary | ICD-10-CM

## 2012-09-13 DIAGNOSIS — R5381 Other malaise: Secondary | ICD-10-CM

## 2012-09-13 DIAGNOSIS — E039 Hypothyroidism, unspecified: Secondary | ICD-10-CM

## 2012-09-13 DIAGNOSIS — Z0000 Encounter for general adult medical examination without abnormal findings: Secondary | ICD-10-CM

## 2012-09-13 DIAGNOSIS — Z23 Encounter for immunization: Secondary | ICD-10-CM

## 2012-09-13 DIAGNOSIS — G8929 Other chronic pain: Secondary | ICD-10-CM

## 2012-09-13 DIAGNOSIS — D259 Leiomyoma of uterus, unspecified: Secondary | ICD-10-CM

## 2012-09-13 DIAGNOSIS — E785 Hyperlipidemia, unspecified: Secondary | ICD-10-CM

## 2012-09-13 DIAGNOSIS — E119 Type 2 diabetes mellitus without complications: Secondary | ICD-10-CM

## 2012-09-13 DIAGNOSIS — Z79899 Other long term (current) drug therapy: Secondary | ICD-10-CM

## 2012-09-13 DIAGNOSIS — M25559 Pain in unspecified hip: Secondary | ICD-10-CM

## 2012-09-13 LAB — GLUCOSE, CAPILLARY: Glucose-Capillary: 126 mg/dL — ABNORMAL HIGH (ref 70–99)

## 2012-09-13 LAB — POCT GLYCOSYLATED HEMOGLOBIN (HGB A1C): Hemoglobin A1C: 6.2

## 2012-09-13 MED ORDER — GABAPENTIN 300 MG PO CAPS: 300.0000 mg | ORAL_CAPSULE | Freq: Two times a day (BID) | ORAL | Status: DC

## 2012-09-13 MED ORDER — TRAMADOL HCL 50 MG PO TABS: 50.0000 mg | ORAL_TABLET | Freq: Four times a day (QID) | ORAL | Status: DC | PRN

## 2012-09-13 NOTE — Assessment & Plan Note (Signed)
The patient has a history of chronic right hip pain, likely secondary to osteoarthritis -will try a course of tramadol for hip pain

## 2012-09-13 NOTE — Assessment & Plan Note (Signed)
History of hyperlipidemia, last LDL = 87 -check lipid panel today -continue lipitor

## 2012-09-13 NOTE — Patient Instructions (Signed)
Your diabetes is well-controlled, congratulations!  Continue your metformin and glipizide. -Remember to set up your yearly eye exam.  If you would like our help in making this appointment, please let us know.  We are checking a few labs today, including a thyroid level, cholesterol level, and vitamin D level.  We will call you with any abnormal results.  We are sending a referral to OB/GYN.  For your hip pain, you may try Tramadol.  Take 1-2 tablets up to every 6 hours as needed for pain.  Please return for a follow-up visit in 3-4 months.

## 2012-09-13 NOTE — Assessment & Plan Note (Signed)
The patient notes a few-week history of fatigue, likely due to difficulty sleeping due to headaches and hip pain.  May also be a symptom of hypothyroidism.  The patient is currently on iron supplementation (history of menorrhagia, s/p partial hysterectomy), making iron deficiency less likely. -check TSH, vitamin D levels

## 2012-09-13 NOTE — Assessment & Plan Note (Signed)
The patient has a history of DM.  A1C today = 6.2, indicating good glucose control with metformin and glipizide -continue metformin and glipizide

## 2012-09-13 NOTE — Assessment & Plan Note (Signed)
-  flu vaccine given today  

## 2012-09-13 NOTE — Assessment & Plan Note (Signed)
The patient has a history of fibroids, s/p partial hysterectomy.  The patient would like to continue to follow-up with OB/GYN for her gynecologic care.  She has been following with Dr. Normand Sloop, but needs a referral. -referral sent for OB/GYN

## 2012-09-13 NOTE — Progress Notes (Signed)
HPI The patient is a 46 y.o. female with a history of DM, sarcoidosis (followed by Dr. Anne Hahn for neurosarcoid), HTN, HL, hypothyroidism s/p surgical resection, presenting for a routine follow-up visit.  The patient notes fatigue for the last couple of weeks.  She thinks this may be due to difficulty sleeping recently, getting only about 4-5 hours of sleep per night due to headaches (from neurosarcoid, treated with methotrexate and vicodin prn [a couple of times per week]) and left hip pain (treated with heating pads).  The patient occasionally takes ambien, but not every night, prescribed by Dr. Anne Hahn.  She has a history of hypothyroidism, and notes some cold intolerance, and fluctuations in appetite (sometimes elevated, sometimes low).  No changes to skin/hair, no constipation.  The patient notes left hip pain, which has been "offf and on" for the last several years.  The pain is worse after long periods of movement.  She notes no history of trauma.  The patient requests referral to OB/GYN, Dr. Normand Sloop.  The patient has a history of fibroids, s/p partial hysterectomy, and would like to continue to follow with Dr. Normand Sloop for her gyn care.  ROS: General: no fevers, chills, changes in weight, changes in appetite Skin: no rash HEENT: no blurry vision, hearing changes, sore throat Pulm: no dyspnea, coughing, wheezing CV: no chest pain, palpitations, shortness of breath Abd: no abdominal pain, nausea/vomiting, diarrhea/constipation GU: no dysuria, hematuria, polyuria Ext: no arthralgias, myalgias Neuro: no weakness, numbness, or tingling  Filed Vitals:   09/13/12 1517  BP: 119/74  Pulse: 80  Temp: 97.2 F (36.2 C)    PEX General: alert, cooperative, and in no apparent distress HEENT: pupils equal round and reactive to light, vision grossly intact, oropharynx clear and non-erythematous  Neck: supple, no lymphadenopathy, thyroidectomy scar present with no palpable thyroid tissue Lungs:  clear to ascultation bilaterally, normal work of respiration, no wheezes, rales, ronchi Heart: regular rate and rhythm, no murmurs, gallops, or rubs Abdomen: soft, non-tender, non-distended, normal bowel sounds Extremities: no cyanosis, clubbing, or edema Neurologic: alert & oriented X3, cranial nerves II-XII intact, strength grossly intact, sensation intact to light touch, reflexes 1+ bilaterally, no tremor noted  Current Outpatient Prescriptions on File Prior to Visit  Medication Sig Dispense Refill  . amLODipine (NORVASC) 10 MG tablet TAKE 1 TABLET (10 MG TOTAL) BY MOUTH DAILY.  30 tablet  11  . aspirin 81 MG tablet Take 81 mg by mouth daily.        Marland Kitchen atorvastatin (LIPITOR) 20 MG tablet TAKE 1 TABLET EVERY DAY  31 tablet  10  . ferrous sulfate 325 (65 FE) MG tablet Take 325 mg by mouth daily with breakfast.        . Fluticasone-Salmeterol (ADVAIR DISKUS) 100-50 MCG/DOSE AEPB Inhale 2 puffs into the lungs every 12 (twelve) hours.  60 each  2  . furosemide (LASIX) 40 MG tablet Take 1 tablet (40 mg total) by mouth 2 (two) times daily.  180 tablet  1  . gabapentin (NEURONTIN) 300 MG capsule Take 1 capsule (300 mg total) by mouth 2 (two) times daily.  62 capsule  3  . glipiZIDE (GLUCOTROL) 10 MG tablet Take 0.5 tablets (5 mg total) by mouth 2 (two) times daily.  60 tablet  11  . levothyroxine (SYNTHROID, LEVOTHROID) 137 MCG tablet TAKE 1 TABLET EVERY DAY  30 tablet  5  . metFORMIN (GLUCOPHAGE) 500 MG tablet Take 1 tablet (500 mg total) by mouth 2 (two) times daily with  a meal.  180 tablet  11  . methotrexate (RHEUMATREX) 2.5 MG tablet 4 tabs every thursday       . pantoprazole (PROTONIX) 40 MG tablet Take 1 tablet (40 mg total) by mouth daily.  30 tablet  6  . zolpidem (AMBIEN) 5 MG tablet Take 5 mg by mouth at bedtime as needed.          Assessment/Plan

## 2012-09-14 LAB — LIPID PANEL
Cholesterol: 119 mg/dL (ref 0–200)
VLDL: 11 mg/dL (ref 0–40)

## 2012-09-14 LAB — VITAMIN D 25 HYDROXY (VIT D DEFICIENCY, FRACTURES): Vit D, 25-Hydroxy: 40 ng/mL (ref 30–89)

## 2012-09-24 ENCOUNTER — Encounter: Payer: Self-pay | Admitting: Obstetrics and Gynecology

## 2012-09-28 ENCOUNTER — Ambulatory Visit (INDEPENDENT_AMBULATORY_CARE_PROVIDER_SITE_OTHER): Payer: Medicaid Other | Admitting: Obstetrics and Gynecology

## 2012-09-28 ENCOUNTER — Encounter: Payer: Self-pay | Admitting: Obstetrics and Gynecology

## 2012-09-28 VITALS — BP 114/72 | Temp 97.9°F | Wt 195.5 lb

## 2012-09-28 DIAGNOSIS — R300 Dysuria: Secondary | ICD-10-CM

## 2012-09-28 DIAGNOSIS — R109 Unspecified abdominal pain: Secondary | ICD-10-CM

## 2012-09-28 LAB — POCT URINALYSIS DIPSTICK
Glucose, UA: NEGATIVE
Ketones, UA: NEGATIVE
Leukocytes, UA: NEGATIVE
Protein, UA: NEGATIVE

## 2012-09-28 MED ORDER — CIPROFLOXACIN HCL 500 MG PO TABS: 500.0000 mg | ORAL_TABLET | Freq: Two times a day (BID) | ORAL | Status: AC

## 2012-09-28 NOTE — Patient Instructions (Signed)
Urinary Tract Infection Urinary tract infections (UTIs) can develop anywhere along your urinary tract. Your urinary tract is your body's drainage system for removing wastes and extra water. Your urinary tract includes two kidneys, two ureters, a bladder, and a urethra. Your kidneys are a pair of bean-shaped organs. Each kidney is about the size of your fist. They are located below your ribs, one on each side of your spine. CAUSES Infections are caused by microbes, which are microscopic organisms, including fungi, viruses, and bacteria. These organisms are so small that they can only be seen through a microscope. Bacteria are the microbes that most commonly cause UTIs. SYMPTOMS  Symptoms of UTIs may vary by age and gender of the patient and by the location of the infection. Symptoms in young women typically include a frequent and intense urge to urinate and a painful, burning feeling in the bladder or urethra during urination. Older women and men are more likely to be tired, shaky, and weak and have muscle aches and abdominal pain. A fever may mean the infection is in your kidneys. Other symptoms of a kidney infection include pain in your back or sides below the ribs, nausea, and vomiting. DIAGNOSIS To diagnose a UTI, your caregiver will ask you about your symptoms. Your caregiver also will ask to provide a urine sample. The urine sample will be tested for bacteria and white blood cells. White blood cells are made by your body to help fight infection. TREATMENT  Typically, UTIs can be treated with medication. Because most UTIs are caused by a bacterial infection, they usually can be treated with the use of antibiotics. The choice of antibiotic and length of treatment depend on your symptoms and the type of bacteria causing your infection. HOME CARE INSTRUCTIONS  If you were prescribed antibiotics, take them exactly as your caregiver instructs you. Finish the medication even if you feel better after you  have only taken some of the medication.  Drink enough water and fluids to keep your urine clear or pale yellow.  Avoid caffeine, tea, and carbonated beverages. They tend to irritate your bladder.  Empty your bladder often. Avoid holding urine for long periods of time.  Empty your bladder before and after sexual intercourse.  After a bowel movement, women should cleanse from front to back. Use each tissue only once. SEEK MEDICAL CARE IF:   You have back pain.  You develop a fever.  Your symptoms do not begin to resolve within 3 days. SEEK IMMEDIATE MEDICAL CARE IF:   You have severe back pain or lower abdominal pain.  You develop chills.  You have nausea or vomiting.  You have continued burning or discomfort with urination. MAKE SURE YOU:   Understand these instructions.  Will watch your condition.  Will get help right away if you are not doing well or get worse. Document Released: 08/27/2005 Document Revised: 05/18/2012 Document Reviewed: 12/26/2011 [employer] Patient Information 2013 [employer].  

## 2012-09-28 NOTE — Progress Notes (Signed)
Vag. Discharge:no Odor:no Fever:no Irreg.Periods:no Dyspareunia:yes Dysuria:yes Frequency:yes Urgency:yes Hematuria:no Kidney stones:yes Constipation:no Diarrhea:no Rectal Bleeding: no Vomiting:no Nausea:yes Pregnant:no Fibroids:no Endometriosis:no Hx of Ovarian Cyst:no Hx IUD:no Hx STD-PID:no Appendectomy:no Gall Bladder Dz:no Results for orders placed in visit on 09/28/12  POCT URINALYSIS DIPSTICK      Component Value Range   Color, UA yellow     Clarity, UA clear     Glucose, UA negative     Bilirubin, UA negative     Ketones, UA negative     Spec Grav, UA 1.015     Blood, UA negative     pH, UA 6.0     Protein, UA negative     Urobilinogen, UA negative     Nitrite, UA negative     Leukocytes, UA Negative     BP 114/72  Temp 97.9 F (36.6 C) (Oral)  Wt 195 lb 8 oz (88.678 kg)  LMP 12/03/2011 Pt c/o of uti sxs and back pain.  She also has dysparuneia Physical Examination: General appearance - alert, well appearing, and in no distress Abdomen - soft, nontender, nondistended, no masses or organomegaly Pelvic - VULVA: normal appearing vulva with no masses, tenderness or lesions, VAGINA: normal appearing vagina with normal color and discharge, no lesions, CERVIX: normal appearing cervix without discharge or lesions, surgically absent, UTERUS: surgically absent, vaginal cuff well healed, ADNEXA: normal adnexa in size, nontender and no masses Back exam - no CVAT B Probable UTI Send urine culture and tx with cipro Pt with dysparuneia.  If still present at aex will do [name]

## 2012-09-30 LAB — URINE CULTURE
Colony Count: NO GROWTH
Organism ID, Bacteria: NO GROWTH

## 2012-10-05 ENCOUNTER — Ambulatory Visit (INDEPENDENT_AMBULATORY_CARE_PROVIDER_SITE_OTHER): Payer: Medicaid Other | Admitting: Internal Medicine

## 2012-10-05 ENCOUNTER — Encounter: Payer: Self-pay | Admitting: Internal Medicine

## 2012-10-05 ENCOUNTER — Ambulatory Visit (HOSPITAL_COMMUNITY)
Admission: RE | Admit: 2012-10-05 | Discharge: 2012-10-05 | Disposition: A | Discharge status: Home / Self Care | Payer: Medicaid Other | Source: Ambulatory Visit | Attending: Internal Medicine | Admitting: Internal Medicine

## 2012-10-05 VITALS — BP 127/83 | HR 92 | Temp 97.8°F | Ht 68.0 in | Wt 191.5 lb

## 2012-10-05 DIAGNOSIS — R0602 Shortness of breath: Secondary | ICD-10-CM | POA: Insufficient documentation

## 2012-10-05 DIAGNOSIS — R05 Cough: Secondary | ICD-10-CM | POA: Insufficient documentation

## 2012-10-05 DIAGNOSIS — R059 Cough, unspecified: Secondary | ICD-10-CM | POA: Insufficient documentation

## 2012-10-05 DIAGNOSIS — R0789 Other chest pain: Secondary | ICD-10-CM | POA: Insufficient documentation

## 2012-10-05 DIAGNOSIS — J40 Bronchitis, not specified as acute or chronic: Secondary | ICD-10-CM | POA: Insufficient documentation

## 2012-10-05 DIAGNOSIS — E119 Type 2 diabetes mellitus without complications: Secondary | ICD-10-CM

## 2012-10-05 MED ORDER — AZITHROMYCIN 250 MG PO TABS: ORAL_TABLET | ORAL | Status: AC

## 2012-10-05 NOTE — Assessment & Plan Note (Addendum)
Patient presents with symptoms of progressive worsening of her cough for last one week where she is bringing greenish to yellowish sputum associated with pleuritic chest pain. I do not know how accurate are the weights but she has lost about 4 lbs since her last visit. Differentials for her presentation include URI  vs pneumonia (typical versus atypical) versus methotrexate induced pneumonitis. -Obtain chest x-ray. -Check CBC with differential and BMET -Empirically start her on Zithromax. -Encouraged her to drink plenty of fluids -Call the clinic if she spikes fevers, her symptoms including chest pain cough or shortness of breath gets worse.  Update: CXR does not show any evidence of acute cardiopulmonary disease. Her labs are significant for mild leukopenia. Her presentation is likely consistent with URI.

## 2012-10-05 NOTE — Progress Notes (Signed)
  Subjective:    Patient ID: [name] [name], female    DOB: [birth year], 46 y.o.   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   Reports having productive cough for last week which is getting worse over this time frame. Cough is mixed dry and productive - bringing up yellowish to green phlegm occasionally. States that it hurts in her chest in her lower ribs when she coughs or takes deep breath( right >left). " I her my chest rattling sometimes"  + fatigue, + Nausea + decreased appetite, + runny nose, + sneezing. Denies any fever or chills or SOB.  She reports weight loss since the last visit.  Filed Weights   10/05/12 1452  Weight: 191 lb 8 oz (86.864 kg)                            195( 10/29)                            200( 10/14)                           195 ( 7/13) She is still taking cipro for her UTI that was prescribed on 10/29 by her OB/GYN for 7 days   Review of Systems  Constitutional: Positive for fatigue.  HENT: Positive for congestion and sneezing.   Respiratory: Positive for cough. Negative for stridor.   Cardiovascular: Positive for chest pain.  Gastrointestinal: Positive for nausea. Negative for abdominal pain, diarrhea and constipation.  Neurological: Positive for light-headedness.       Objective:   Physical Exam  Constitutional: She appears well-developed.       Uncomfortable appearing  HENT:  Head: Normocephalic.       Dry mucous membrane  Neck: Normal range of motion. Neck supple. No tracheal deviation present. No thyromegaly present.  Pulmonary/Chest: No stridor.       Coarse breath sounds bilaterally  Abdominal: Soft. Bowel sounds are normal. She exhibits no distension. There is no tenderness.  Neurological: She is alert. No cranial nerve deficit.  Skin: Skin is  warm.          Assessment & Plan:

## 2012-10-05 NOTE — Patient Instructions (Addendum)
Please schedule a follow up appointment in 1 week . Please bring your medication bottles with your next appointment. Please take your medicines as prescribed. I will call you with your lab results if anything will be abnormal. Please drink plenty of fluids. Please call us if you start having severe chest pain, worsening SOB or fevers etc.

## 2012-10-06 LAB — COMPLETE METABOLIC PANEL WITH GFR
Alkaline Phosphatase: 59 U/L (ref 39–117)
BUN: 12 mg/dL (ref 6–23)
CO2: 27 mEq/L (ref 19–32)
GFR, Est African American: >89 mL/min
GFR, Est Non African American: 87 mL/min
Glucose, Bld: 91 mg/dL (ref 70–99)
Potassium: 4.3 mEq/L (ref 3.5–5.3)
Sodium: 139 mEq/L (ref 135–145)
Total Bilirubin: 0.3 mg/dL (ref 0.3–1.2)
Total Protein: 7 g/dL (ref 6.0–8.3)

## 2012-10-06 LAB — CBC WITH DIFFERENTIAL/PLATELET
HCT: 37.4 % (ref 36.0–46.0)
Hemoglobin: 12.4 g/dL (ref 12.0–15.0)
Lymphocytes Relative: 38 % (ref 12–46)
Lymphs Abs: 1.4 10*3/uL (ref 0.7–4.0)
Monocytes Absolute: 0.4 10*3/uL (ref 0.1–1.0)
Monocytes Relative: 12 % (ref 3–12)
Neutro Abs: 1.7 10*3/uL (ref 1.7–7.7)
RBC: 4.48 MIL/uL (ref 3.87–5.11)
WBC: 3.6 10*3/uL — ABNORMAL LOW (ref 4.0–10.5)

## 2012-10-12 ENCOUNTER — Ambulatory Visit (INDEPENDENT_AMBULATORY_CARE_PROVIDER_SITE_OTHER): Payer: Medicaid Other | Admitting: Internal Medicine

## 2012-10-12 ENCOUNTER — Encounter: Payer: Self-pay | Admitting: Internal Medicine

## 2012-10-12 VITALS — BP 115/78 | HR 78 | Temp 97.5°F | Wt 196.7 lb

## 2012-10-12 DIAGNOSIS — J40 Bronchitis, not specified as acute or chronic: Secondary | ICD-10-CM

## 2012-10-12 MED ORDER — PREDNISONE 20 MG PO TABS: 20.0000 mg | ORAL_TABLET | Freq: Every day | ORAL | Status: DC

## 2012-10-12 NOTE — Patient Instructions (Addendum)
Please schedule a follow up appointment in 1-2 months. Please bring your medication bottles with your next appointment. Please take your medicines as prescribed.   

## 2012-10-12 NOTE — Progress Notes (Signed)
Subjective:   Patient ID: [name] [name] female   DOB: 1966/09/10 46 y.o.   MRN: [id number]  HPI: 46 year old woman with past medical history significant for neurosarcoid, hypertension, diabetes mellitus presents to the clinic for a followup on her URI.  She was seen in the clinic last week for URI and was prescribed azithromycin.  Still reports having cough- bringing up yellowish stuff. Still could hear rattling in her chest and complains of pain in her chest occasionally when she coughs. Also reports having nausea. Denies any fever, chills, vomiting. She has finished her course of azithromycin as directed. States that she felt a little better yesterday but again was feeling very congested again this morning. Continues to feel weak and tired.    Past Medical History  Diagnosis Date  . Sarcoidosis   . OSA (obstructive sleep apnea)   . Prolonged QT interval   . Osteoporosis   . Anemia   . GERD (gastroesophageal reflux disease)   . DM (diabetes mellitus)   . Peripheral neuropathy   . HTN (hypertension)   . Nephrolithiasis   . Chronic fatigue    Family History  Problem Relation Age of Onset  . Heart disease Mother     questionable CAD and arrythmias    History   Social History  . Marital Status: Married    Spouse Name: N/A    Number of Children: N/A  . Years of Education: 12   Occupational History  .     Social History Main Topics  . Smoking status: Never Smoker   . Smokeless tobacco: Not on file  . Alcohol Use: No  . Drug Use: No  . Sexually Active: Not on file     Comment: hysterectomy   Other Topics Concern  . Not on file   Social History Narrative   Lives in [place] with 2 sons and her husband;disabled; no use of tobacco products nor alcohol.Patient given diabetes card on 12/05/2010.   Review of Systems: General: Denies fever, chills, diaphoresis, appetite change and fatigue. HEENT: Denies photophobia, eye pain, redness, hearing loss, ear pain,  congestion, sore throat, rhinorrhea, sneezing, mouth sores, trouble swallowing, neck pain, neck stiffness and tinnitus. Respiratory: Denies SOB, DOE, , chest tightness, and wheezing, + cough Cardiovascular: Denies to chest pain,  palpitations and leg swelling. Gastrointestinal: Denies nausea, vomiting, abdominal pain, diarrhea, constipation, blood in stool and abdominal distention. Genitourinary: Denies dysuria, urgency, frequency, hematuria, flank pain and difficulty urinating. Musculoskeletal: Denies myalgias, back pain, joint swelling, arthralgias and gait problem.  Skin: Denies pallor, rash and wound. Neurological: Denies dizziness, seizures, syncope, weakness, light-headedness, numbness and headaches. Hematological: Denies adenopathy, easy bruising, personal or family bleeding history. Psychiatric/Behavioral: Denies suicidal ideation, mood changes, confusion, nervousness, sleep disturbance and agitation.    Current Outpatient Medications: Current Outpatient Prescriptions  Medication Sig Dispense Refill  . amLODipine (NORVASC) 10 MG tablet TAKE 1 TABLET (10 MG TOTAL) BY MOUTH DAILY.  30 tablet  11  . aspirin 81 MG tablet Take 81 mg by mouth daily.        Marland Kitchen atorvastatin (LIPITOR) 20 MG tablet TAKE 1 TABLET EVERY DAY  31 tablet  10  . ciprofloxacin (CIPRO) 500 MG tablet Take 1 tablet (500 mg total) by mouth 2 (two) times daily.  14 tablet  0  . Esomeprazole Magnesium (NEXIUM PO) Take by mouth.      . ferrous sulfate 325 (65 FE) MG tablet Take 325 mg by mouth daily with breakfast.        .  Fluticasone-Salmeterol (ADVAIR DISKUS) 100-50 MCG/DOSE AEPB Inhale 2 puffs into the lungs every 12 (twelve) hours.  60 each  2  . furosemide (LASIX) 40 MG tablet Take 1 tablet (40 mg total) by mouth 2 (two) times daily.  180 tablet  1  . gabapentin (NEURONTIN) 300 MG capsule Take 1 capsule (300 mg total) by mouth 2 (two) times daily.  62 capsule  3  . glipiZIDE (GLUCOTROL) 10 MG tablet Take 0.5 tablets (5  mg total) by mouth 2 (two) times daily.  60 tablet  11  . levothyroxine (SYNTHROID, LEVOTHROID) 137 MCG tablet TAKE 1 TABLET EVERY DAY  30 tablet  5  . metFORMIN (GLUCOPHAGE) 500 MG tablet Take 1 tablet (500 mg total) by mouth 2 (two) times daily with a meal.  180 tablet  11  . methotrexate (RHEUMATREX) 2.5 MG tablet 4 tabs every thursday       . pantoprazole (PROTONIX) 40 MG tablet Take 1 tablet (40 mg total) by mouth daily.  30 tablet  6  . traMADol (ULTRAM) 50 MG tablet Take 1-2 tablets (50-100 mg total) by mouth every 6 (six) hours as needed for pain.  60 tablet  0  . zolpidem (AMBIEN) 5 MG tablet Take 5 mg by mouth at bedtime as needed.          Allergies: No Known Allergies    Objective:   Physical Exam: Filed Vitals:   10/12/12 1355  BP: 115/78  Pulse: 78  Temp: 97.5 F (36.4 C)    General: Vital signs reviewed and noted. Well-developed, well-nourished, in no acute distress; alert, appropriate and cooperative throughout examination. Head: Normocephalic, atraumatic Lungs: Normal respiratory effort. Coarse breath sounds bilaterally, + rhonchi on left side Heart: RRR. S1 and S2 normal without gallop, murmur, or rubs. Abdomen:BS normoactive. Soft, Nondistended, non-tender.  No masses or organomegaly. Extremities: No pretibial edema.     Assessment & Plan:

## 2012-10-12 NOTE — Assessment & Plan Note (Signed)
She continues to have cough bringing up yellowish to green phlegm. Also reports having some pain in her chest with cough which is not present all the time and is better than last visit. On exam she was noticed to have coarse breath sounds and audible rhonchi in the left lower lung. She has completed her course of azithromycin. Her chest x-ray from last visit did not show evidence any evidence of pneumonia. I think this is most likely bronchitis. -Given her a short course of prednisone taper. -She was advised to drink plenty of fluids. -Call the clinic if she doesn't feel better in one week. -She was advised not to take methotrexate with her bronchitis this week.

## 2012-11-11 ENCOUNTER — Encounter: Payer: Self-pay | Admitting: Obstetrics and Gynecology

## 2012-11-11 ENCOUNTER — Other Ambulatory Visit: Payer: Self-pay | Admitting: Obstetrics and Gynecology

## 2012-11-11 ENCOUNTER — Ambulatory Visit (INDEPENDENT_AMBULATORY_CARE_PROVIDER_SITE_OTHER): Payer: Medicaid Other

## 2012-11-11 ENCOUNTER — Ambulatory Visit (INDEPENDENT_AMBULATORY_CARE_PROVIDER_SITE_OTHER): Payer: Medicaid Other | Admitting: Obstetrics and Gynecology

## 2012-11-11 VITALS — BP 120/68 | HR 72 | Ht 68.0 in | Wt 199.0 lb

## 2012-11-11 DIAGNOSIS — Z124 Encounter for screening for malignant neoplasm of cervix: Secondary | ICD-10-CM

## 2012-11-11 DIAGNOSIS — R1031 Right lower quadrant pain: Secondary | ICD-10-CM

## 2012-11-11 DIAGNOSIS — N83209 Unspecified ovarian cyst, unspecified side: Secondary | ICD-10-CM

## 2012-11-11 DIAGNOSIS — R109 Unspecified abdominal pain: Secondary | ICD-10-CM

## 2012-11-11 LAB — POCT URINALYSIS DIPSTICK
Ketones, UA: NEGATIVE
Leukocytes, UA: NEGATIVE
Nitrite, UA: NEGATIVE
Protein, UA: NEGATIVE
pH, UA: >=9

## 2012-11-11 NOTE — Progress Notes (Signed)
Last Pap: 10/03/11 WNL: Yes Regular Periods:no Contraception: hysterectomy   Monthly Breast exam:yes Tetanus<78yrs:yes Nl.Bladder Function:no urgency  Daily BMs:no Healthy Diet:yes Calcium:no Mammogram:yes Date of Mammogram: 08/2012 Exercise:yes Have often Exercise: walking daily  Seatbelt: yes Abuse at home: no Stressful work:no Sigmoid-colonoscopy: n/a Bone Density: Yes years ago per pt PCP: Dr. Bosie Clos  Change in PMH: none Change in [facility]: none BP 120/68  Pulse 72  Ht 5\' 8"  (1.727 m)  Wt 199 lb (90.266 kg)  BMI 30.26 kg/m2  LMP 12/03/2011 Pt with complaints:ye for the past three days she has had mild pain in her pelvis.  No dysuria.  She does have h/o kidney stones Physical Examination: General appearance - alert, well appearing, and in no distress Mental status - normal mood, behavior, speech, dress, motor activity, and thought processes Neck - supple, no significant adenopathy,  thyroid exam: thyroid is normal in size without nodules or tenderness Chest - clear to auscultation, no wheezes, rales or rhonchi, symmetric air entry Heart - normal rate and regular rhythm Abdomen - soft, nontender, nondistended, no masses or organomegaly Breasts - breasts appear normal, no suspicious masses, no skin or nipple changes or axillary nodes Pelvic - normal external genitalia, vulva, vagina,and adnexa.  Uterus and cervix are absent Rectal - normal rectal, no masses, rectal exam not indicated Back exam - full range of motion, no tenderness, palpable spasm or pain on motion Neurological - alert, oriented, normal speech, no focal findings or movement disorder noted Musculoskeletal - no joint tenderness, deformity or swelling Extremities - no edema, redness or tenderness in the calves or thighs Skin - normal coloration and turgor, no rashes, no suspicious skin lesions noted Routine exam CPP pt s/p hysterectomy.  Now with acute RLQ pain Pap sent yes Mammogram due no hysterectomy used  for contraception RT 6 weeks for repeat US .  3.2 c simple cyst seen on right ovary

## 2012-11-11 NOTE — Patient Instructions (Signed)
Ovarian Cyst The ovaries are small organs that are on each side of the uterus. The ovaries are the organs that produce the female hormones, estrogen and progesterone. An ovarian cyst is a sac filled with fluid that can vary in its size. It is normal for a small cyst to form in women who are in the childbearing age and who have menstrual periods. This type of cyst is called a follicle cyst that becomes an ovulation cyst (corpus luteum cyst) after it produces the women's egg. It later goes away on its own if the woman does not become pregnant. There are other kinds of ovarian cysts that may cause problems and may need to be treated. The most serious problem is a cyst with cancer. It should be noted that menopausal women who have an ovarian cyst are at a higher risk of it being a cancer cyst. They should be evaluated very quickly, thoroughly and followed closely. This is especially true in menopausal women because of the high rate of ovarian cancer in women in menopause. CAUSES AND TYPES OF OVARIAN CYSTS:  FUNCTIONAL CYST: The follicle/corpus luteum cyst is a functional cyst that occurs every month during ovulation with the menstrual cycle. They go away with the next menstrual cycle if the woman does not get pregnant. Usually, there are no symptoms with a functional cyst.  ENDOMETRIOMA CYST: This cyst develops from the lining of the uterus tissue. This cyst gets in or on the ovary. It grows every month from the bleeding during the menstrual period. It is also called a "chocolate cyst" because it becomes filled with blood that turns brown. This cyst can cause pain in the lower abdomen during intercourse and with your menstrual period.  CYSTADENOMA CYST: This cyst develops from the cells on the outside of the ovary. They usually are not cancerous. They can get very big and cause lower abdomen pain and pain with intercourse. This type of cyst can twist on itself, cut off its blood supply and cause severe pain. It  also can easily rupture and cause a lot of pain.  DERMOID CYST: This type of cyst is sometimes found in both ovaries. They are found to have different kinds of body tissue in the cyst. The tissue includes skin, teeth, hair, and/or cartilage. They usually do not have symptoms unless they get very big. Dermoid cysts are rarely cancerous.  POLYCYSTIC OVARY: This is a rare condition with hormone problems that produces many small cysts on both ovaries. The cysts are follicle-like cysts that never produce an egg and become a corpus luteum. It can cause an increase in body weight, infertility, acne, increase in body and facial hair and lack of menstrual periods or rare menstrual periods. Many women with this problem develop type 2 diabetes. The exact cause of this problem is unknown. A polycystic ovary is rarely cancerous.  THECA LUTEIN CYST: Occurs when too much hormone (human chorionic gonadotropin) is produced and over-stimulates the ovaries to produce an egg. They are frequently seen when doctors stimulate the ovaries for invitro-fertilization (test tube babies).  LUTEOMA CYST: This cyst is seen during pregnancy. Rarely it can cause an obstruction to the birth canal during labor and delivery. They usually go away after delivery. SYMPTOMS   Pelvic pain or pressure.  Pain during sexual intercourse.  Increasing girth (swelling) of the abdomen.  Abnormal menstrual periods.  Increasing pain with menstrual periods.  You stop having menstrual periods and you are not pregnant. DIAGNOSIS  The diagnosis can   be made during:  Routine or annual pelvic examination (common).  Ultrasound.  X-ray of the pelvis.  CT Scan.  MRI.  Blood tests. TREATMENT   Treatment may only be to follow the cyst monthly for 2 to 3 months with your caregiver. Many go away on their own, especially functional cysts.  May be aspirated (drained) with a long needle with ultrasound, or by laparoscopy (inserting a tube into  the pelvis through a small incision).  The whole cyst can be removed by laparoscopy.  Sometimes the cyst may need to be removed through an incision in the lower abdomen.  Hormone treatment is sometimes used to help dissolve certain cysts.  Birth control pills are sometimes used to help dissolve certain cysts. HOME CARE INSTRUCTIONS  Follow your caregiver's advice regarding:  Medicine.  Follow up visits to evaluate and treat the cyst.  You may need to come back or make an appointment with another caregiver, to find the exact cause of your cyst, if your caregiver is not a gynecologist.  Get your yearly and recommended pelvic examinations and Pap tests.  Let your caregiver know if you have had an ovarian cyst in the past. SEEK MEDICAL CARE IF:   Your periods are late, irregular, they stop, or are painful.  Your stomach (abdomen) or pelvic pain does not go away.  Your stomach becomes larger or swollen.  You have pressure on your bladder or trouble emptying your bladder completely.  You have painful sexual intercourse.  You have feelings of fullness, pressure, or discomfort in your stomach.  You lose weight for no apparent reason.  You feel generally ill.  You become constipated.  You lose your appetite.  You develop acne.  You have an increase in body and facial hair.  You are gaining weight, without changing your exercise and eating habits.  You think you are pregnant. SEEK IMMEDIATE MEDICAL CARE IF:   You have increasing abdominal pain.  You feel sick to your stomach (nausea) and/or vomit.  You develop a fever that comes on suddenly.  You develop abdominal pain during a bowel movement.  Your menstrual periods become heavier than usual. Document Released: 11/17/2005 Document Revised: 02/09/2012 Document Reviewed: 09/20/2009 [employer] Patient Information 2013 [employer].  

## 2012-11-12 ENCOUNTER — Telehealth: Payer: Self-pay | Admitting: Obstetrics and Gynecology

## 2012-11-12 NOTE — Telephone Encounter (Signed)
Tc to pt regarding request for Ibuprofen. Pt had appt yesterday and states that Ibuprofen was supposed to be called in to her pharmacy. Ibuprofen is not listed on rx documentation.

## 2012-11-15 ENCOUNTER — Ambulatory Visit: Payer: Medicaid Other | Admitting: Internal Medicine

## 2012-11-15 LAB — PAP IG W/ RFLX HPV ASCU

## 2012-11-16 ENCOUNTER — Telehealth: Payer: Self-pay

## 2012-11-16 MED ORDER — IBUPROFEN 600 MG PO TABS: 600.0000 mg | ORAL_TABLET | Freq: Four times a day (QID) | ORAL | Status: DC | PRN

## 2012-11-16 NOTE — Telephone Encounter (Signed)
Ok to call in Ibuprofen 600 mg per ND. Rx sent to pt pharmacy, pt notified and voiced understanding.

## 2012-11-21 NOTE — Telephone Encounter (Signed)
Pt may have motrin 600 mg q 6 hrs prn pain.  Disp 30 with 1 refill

## 2012-11-25 ENCOUNTER — Other Ambulatory Visit: Payer: Self-pay | Admitting: Internal Medicine

## 2012-11-29 ENCOUNTER — Other Ambulatory Visit: Payer: Self-pay | Admitting: Internal Medicine

## 2012-12-06 ENCOUNTER — Encounter: Payer: Self-pay | Admitting: Internal Medicine

## 2012-12-06 ENCOUNTER — Ambulatory Visit (INDEPENDENT_AMBULATORY_CARE_PROVIDER_SITE_OTHER): Payer: Medicaid Other | Admitting: Internal Medicine

## 2012-12-06 VITALS — BP 127/81 | HR 77 | Temp 97.5°F | Ht 68.0 in | Wt 200.8 lb

## 2012-12-06 DIAGNOSIS — M1990 Unspecified osteoarthritis, unspecified site: Secondary | ICD-10-CM

## 2012-12-06 DIAGNOSIS — E039 Hypothyroidism, unspecified: Secondary | ICD-10-CM

## 2012-12-06 DIAGNOSIS — E785 Hyperlipidemia, unspecified: Secondary | ICD-10-CM

## 2012-12-06 DIAGNOSIS — M810 Age-related osteoporosis without current pathological fracture: Secondary | ICD-10-CM

## 2012-12-06 DIAGNOSIS — E119 Type 2 diabetes mellitus without complications: Secondary | ICD-10-CM

## 2012-12-06 DIAGNOSIS — Z79899 Other long term (current) drug therapy: Secondary | ICD-10-CM

## 2012-12-06 DIAGNOSIS — Z6830 Body mass index (BMI) 30.0-30.9, adult: Secondary | ICD-10-CM

## 2012-12-06 DIAGNOSIS — M25559 Pain in unspecified hip: Secondary | ICD-10-CM

## 2012-12-06 DIAGNOSIS — M899 Disorder of bone, unspecified: Secondary | ICD-10-CM

## 2012-12-06 DIAGNOSIS — I10 Essential (primary) hypertension: Secondary | ICD-10-CM

## 2012-12-06 LAB — POCT GLYCOSYLATED HEMOGLOBIN (HGB A1C): Hemoglobin A1C: 6.3

## 2012-12-06 MED ORDER — DICLOFENAC SODIUM 75 MG PO TBEC: 75.0000 mg | DELAYED_RELEASE_TABLET | Freq: Two times a day (BID) | ORAL | Status: DC

## 2012-12-06 MED ORDER — DICLOFENAC SODIUM 1 % TD GEL: 4.0000 g | Freq: Four times a day (QID) | TRANSDERMAL | Status: DC

## 2012-12-06 NOTE — Assessment & Plan Note (Signed)
Cont hip pain left> right.  Tramadol and Tylenol ineffective. Osteopenia of left hip on DEXA.  Hx significant for Chemo for brain tumor in 2002 and prior chronic prednisone for neurosarcoidosis. -refer to Sports Med and Rehab

## 2012-12-06 NOTE — Assessment & Plan Note (Signed)
Voltaren gel and tablets prn, referral to Sports Medicine Rehab

## 2012-12-06 NOTE — Progress Notes (Signed)
  Subjective:    Patient ID: [name] [name], female    DOB: 11-07-1966, 47 y.o.   MRN: [id number]  HPI  Hx significant for DM, htn, hyperlipidemia, osteoarthritis with chronic hip pain, sarcoidosis, and hypothyroidism who presents for f/u of her recent bronchitis.  Cough has fully resolved.  Pt complains of continued bilateral hip pain right greater than left that persists for most of the day and is now preventing her from sleeping well at night.  She reports that the Tramadol (50-100 mg q6h) and Tylenol 600 mg has been ineffective.      Review of Systems As per HPI    Objective:   Physical Exam  Constitutional: She is oriented to person, place, and time. She appears well-developed and well-nourished. No distress.  HENT:  Head: Normocephalic and atraumatic.  Pulmonary/Chest: Effort normal and breath sounds normal.  Musculoskeletal:       Right hip: She exhibits decreased range of motion. She exhibits normal strength and no tenderness.       Left hip: She exhibits decreased range of motion. She exhibits normal strength, no tenderness and no swelling.  Neurological: She is alert and oriented to person, place, and time.  Psychiatric: She has a normal mood and affect. Her behavior is normal.          Assessment & Plan:  1. Osteoarthritis with Osteopenia of hip: with prior chemotherapy (brain tumor) and prior chronic steroid therapy for neurosarcoidosis, reluctant for steroid joint injection for worsening DM control side effects -prescribe Voltaren tablet and gel  -refer to Sports Medicine-->  appt 12/13/2012 at 11:30am -may have to escalate to opioid analgesics ie Vicoden or Percocet  2. DM2: well controlled with hgbA1c 6.3 on Metformin 500 mg bid -eye check Jan 2014 per [facility] -Foot exam at next visit  3. Hypothyroidism: controlled on current levothyroxine dosage 137 mcg qd  4. Hypertension: at goal on amlodipine 5 mg qd and lasix 40 mg bid  5. Hyperlipidemia: at goal  on atorvastatin 20 mg qd  6. Preventive Care: Paps done per GYN Dr. Normand Sloop, pt post-hysterectomy

## 2012-12-06 NOTE — Patient Instructions (Addendum)
General Instructions: An appointment has been scheduled with [facility] for your hip pain. Fill the prescriptions for the Voltaren as take/use as prescribed. Hopefully this will control your pain so that you make have a restful sleep. Follow-up with your eye doctor for your yearly exam. As we discussed, your last TSH level was normal in October.  We will not make changes to your thyroid medication today. Follow-up with me in 4 months.   Treatment Goals:  Goals (1 Years of Data) as of 12/06/2012          As of Today 11/11/12 10/12/12 10/05/12 09/28/12     Blood Pressure    . Blood Pressure < 140/90  127/81 120/68 115/78 127/83 114/72     Result Component    . HEMOGLOBIN A1C < 7.0  6.3        . LDL CALC < 100            Progress Toward Treatment Goals:  Treatment Goal 12/06/2012  Hemoglobin A1C at goal  Blood pressure at goal    Self Care Goals & Plans:  Self Care Goal 12/06/2012  Manage my medications take my medicines as prescribed; bring my medications to every visit  Eat healthy foods drink diet soda or water instead of juice or soda; eat more vegetables; eat foods that are low in salt; eat baked foods instead of fried foods  Be physically active find an activity I enjoy       Care Management & Community Referrals:   Diclofenac skin gel What is this medicine? DICLOFENAC (dye KLOE fen ak) is a non-steroidal anti-inflammatory drug (NSAID). The 1% skin gel is used to treat osteoarthritis of the hands or knees. The 3% skin gel is used to treat actinic keratosis. This medicine may be used for other purposes; ask your health care provider or pharmacist if you have questions. What should I tell my health care provider before I take this medicine? They need to know if you have any of these conditions: -asthma -bleeding problems -coronary artery bypass graft (CABG) surgery within the past 2 weeks -heart disease -high blood pressure -if you frequently drink  alcohol containing drinks -kidney disease -liver disease -open or infected skin -stomach problems -an unusual or allergic reaction to diclofenac, aspirin, other NSAIDs, other medicines, benzyl alcohol (3% gel only), foods, dyes, or preservatives -pregnant or trying to get pregnant -breast-feeding How should I use this medicine? This medicine is for external use only. Follow the directions on the prescription label. Wash hands before and after use. Do not get this medicine in your eyes. If you do, rinse out with plenty of cool tap water. Use your doses at regular intervals. Do not use your medicine more often than directed. A special MedGuide will be given to you by the pharmacist with each prescription and refill of the 1% gel. Be sure to read this information carefully each time. Talk to your pediatrician regarding the use of this medicine in children. Special care may be needed. The 3% gel is not approved for use in children. Overdosage: If you think you have taken too much of this medicine contact a poison control center or emergency room at once. NOTE: This medicine is only for you. Do not share this medicine with others. What if I miss a dose? If you miss a dose, use it as soon as you can. If it is almost time for your next dose, use only that dose. Do not use  double or extra doses. What may interact with this medicine? -aspirin -NSAIDs, medicines for pain and inflammation, like ibuprofen or naproxen Do not use any other skin products without telling your doctor or health care professional. This list may not describe all possible interactions. Give your health care provider a list of all the medicines, herbs, non-prescription drugs, or dietary supplements you use. Also tell them if you smoke, drink alcohol, or use illegal drugs. Some items may interact with your medicine. What should I watch for while using this medicine? Tell your doctor or healthcare professional if your symptoms do not  start to get better or if they get worse. You will need to follow up with your health care provider to monitor your progress. You may need to be treated for up to 3 months if you are using the 3% gel, but the full effect may not occur until 1 month after stopping treatment. If you develop a severe skin reaction, contact your doctor or health care professional immediately. This medicine can make you more sensitive to the sun. Keep out of the sun. If you cannot avoid being in the sun, wear protective clothing and use sunscreen. Do not use sun lamps or tanning beds/booths. Do not take medicines such as ibuprofen and naproxen with this medicine. Side effects such as stomach upset, nausea, or ulcers may be more likely to occur. Many medicines available without a prescription should not be taken with this medicine. This medicine does not prevent heart attack or stroke. In fact, this medicine may increase the chance of a heart attack or stroke. The chance may increase with longer use of this medicine and in people who have heart disease. If you take aspirin to prevent heart attack or stroke, talk with your doctor or health care professional. This medicine can cause ulcers and bleeding in the stomach and intestines at any time during treatment. Do not smoke cigarettes or drink alcohol. These increase irritation to your stomach and can make it more susceptible to damage from this medicine. Ulcers and bleeding can happen without warning symptoms and can cause death. You may get drowsy or dizzy. Do not drive, use machinery, or do anything that needs mental alertness until you know how this medicine affects you. Do not stand or sit up quickly, especially if you are an older patient. This reduces the risk of dizzy or fainting spells. This medicine can cause you to bleed more easily. Try to avoid damage to your teeth and gums when you brush or floss your teeth. What side effects may I notice from receiving this  medicine? Side effects that you should report to your doctor or health care professional as soon as possible: -allergic reactions like skin rash, itching or hives, swelling of the face, lips, or tongue -black or bloody stools, blood in the urine or vomit -blurred vision -chest pain -difficulty breathing or wheezing -nausea or vomiting -redness, blistering, peeling or loosening of the skin, including inside the mouth -slurred speech or weakness on one side of the body -trouble passing urine or change in the amount of urine -unexplained weight gain or swelling -unusually weak or tired -yellowing of eyes or skin Side effects that usually do not require medical attention (report to your doctor or health care professional if they continue or are bothersome): -dizziness -dry skin -headache -heartburn -increased sensitivity to the sun -stomach pain -tingling at the application site This list may not describe all possible side effects. Call your doctor for medical advice  about side effects. You may report side effects to FDA at 1-800-FDA-1088. Where should I keep my medicine? Keep out of the reach of children. Store the 1% gel at room temperature between 15 and 30 degrees C (59 and 86 degrees F). Store the 3% gel at room temperature between 20 and 25 degrees C (68 and 77 degrees F). Protect from light. Throw away any unused medicine after the expiration date. NOTE: This sheet is a summary. It may not cover all possible information. If you have questions about this medicine, talk to your doctor, pharmacist, or health care provider.  2013, Elsevier/Gold Standard. (03/20/2008 4:35:07 PM)   Diclofenac sodium enteric-coated tablets What is this medicine? DICLOFENAC (dye KLOE fen ak) is a non-steroidal anti-inflammatory drug (NSAID). It is used to reduce swelling and to treat pain. It may be used to treat osteoarthritis, rheumatoid arthritis, and ankylosing spondylitis. This medicine may be used  for other purposes; ask your health care provider or pharmacist if you have questions. What should I tell my health care provider before I take this medicine? They need to know if you have any of these conditions: -asthma, especially aspirin sensitive asthma -coronary artery bypass graft (CABG) surgery within the past 2 weeks -drink more than 3 alcohol containing drinks a day -heart disease or circulation problems like heart failure or leg edema (fluid retention) -high blood pressure -kidney disease -liver disease -stomach bleeding or ulcers -an unusual or allergic reaction to diclofenac, aspirin, other NSAIDs, other medicines, foods, dyes, or preservatives -pregnant or trying to get pregnant -breast-feeding How should I use this medicine? Take this medicine by mouth with food and with a full glass of water. Do not crush or chew the medicine. Follow the directions on the prescription label. Take your medicine at regular intervals. Do not take your medicine more often than directed. Long-term, continuous use may increase the risk of heart attack or stroke. A special MedGuide will be given to you by the pharmacist with each prescription and refill. Be sure to read this information carefully each time. Talk to your pediatrician regarding the use of this medicine in children. Special care may be needed. Elderly patients over 86 years old may have a stronger reaction and need a smaller dose. Overdosage: If you think you have taken too much of this medicine contact a poison control center or emergency room at once. NOTE: This medicine is only for you. Do not share this medicine with others. What if I miss a dose? If you miss a dose, take it as soon as you can. If it is almost time for your next dose, take only that dose. Do not take double or extra doses. What may interact with this medicine? Do not take this medicine with any of the following  medications: -cidofovir -ketorolac -methotrexate This medicine may also interact with the following medications: -alcohol -aspirin and aspirin like medicines -cyclosporine -diuretics -lithium -medicines for blood pressure -medicines for osteoporosis -medicines that affect platelets -medicines that treat or prevent blood clots like warfarin -NSAIDs, medicines for pain and inflammation, like ibuprofen or naproxen -pemetrexed -steroid medicines like prednisone or cortisone This list may not describe all possible interactions. Give your health care provider a list of all the medicines, herbs, non-prescription drugs, or dietary supplements you use. Also tell them if you smoke, drink alcohol, or use illegal drugs. Some items may interact with your medicine. What should I watch for while using this medicine? Tell your doctor or health care professional if  your pain does not get better. Talk to your doctor before taking another medicine for pain. Do not treat yourself. This medicine does not prevent heart attack or stroke. In fact, this medicine may increase the chance of a heart attack or stroke. The chance may increase with longer use of this medicine and in people who have heart disease. If you take aspirin to prevent heart attack or stroke, talk with your doctor or health care professional. Do not take medicines such as ibuprofen and naproxen with this medicine. Side effects such as stomach upset, nausea, or ulcers may be more likely to occur. Many medicines available without a prescription should not be taken with this medicine. This medicine can cause ulcers and bleeding in the stomach and intestines at any time during treatment. Do not smoke cigarettes or drink alcohol. These increase irritation to your stomach and can make it more susceptible to damage from this medicine. Ulcers and bleeding can happen without warning symptoms and can cause death. You may get drowsy or dizzy. Do not drive, use  machinery, or do anything that needs mental alertness until you know how this medicine affects you. Do not stand or sit up quickly, especially if you are an older patient. This reduces the risk of dizzy or fainting spells. This medicine can cause you to bleed more easily. Try to avoid damage to your teeth and gums when you brush or floss your teeth. What side effects may I notice from receiving this medicine? Side effects that you should report to your doctor or health care professional as soon as possible: -allergic reactions like skin rash, itching or hives, swelling of the face, lips, or tongue -black or bloody stools, blood in the urine or vomit -blurred vision -chest pain -difficulty breathing or wheezing -nausea or vomiting -slurred speech or weakness on one side of the body -unexplained weight gain or swelling -unusually weak or tired -yellowing of eyes or skin Side effects that usually do not require medical attention (report to your doctor or health care professional if they continue or are bothersome): -constipation -diarrhea -dizziness -headache -heartburn This list may not describe all possible side effects. Call your doctor for medical advice about side effects. You may report side effects to FDA at 1-800-FDA-1088. Where should I keep my medicine? Keep out of the reach of children. Store at room temperature below 30 degrees C (86 degrees F). Protect from moisture. Keep container tightly closed. Throw away any unused medicine after the expiration date. NOTE: This sheet is a summary. It may not cover all possible information. If you have questions about this medicine, talk to your doctor, pharmacist, or health care provider.  2013, Elsevier/Gold Standard. (04/06/2009 2:42:31 PM)

## 2012-12-06 NOTE — Assessment & Plan Note (Signed)
Lab Results  Component Value Date   HGBA1C 6.3 12/06/2012   HGBA1C 6.2 09/13/2012   HGBA1C 6.3 06/01/2012     Assessment:  Diabetes control: good control (HgbA1C at goal)  Progress toward A1C goal:  at goal  Comments:   Plan:  Medications:  continue current medications  Home glucose monitoring:   Frequency:     Timing:    Instruction/counseling given: reminded to get eye exam  Educational resources provided: brochure  Self management tools provided:    Other plans: f/o with [facility], cont metformin 500 mg bid

## 2012-12-06 NOTE — Assessment & Plan Note (Signed)
BP Readings from Last 3 Encounters:  12/06/12 127/81  11/11/12 120/68  10/12/12 115/78    Lab Results  Component Value Date   NA 139 10/05/2012   K 4.3 10/05/2012   CREATININE 0.82 10/05/2012    Assessment:  Blood pressure control: controlled  Progress toward BP goal:  at goal  Comments:   Plan:  Medications:  continue current medications  Educational resources provided: brochure  Self management tools provided: home blood pressure logbook  Other plans: cont amlodipine 10 mg qd

## 2012-12-07 NOTE — Assessment & Plan Note (Signed)
States that exercise restricted by hip pain.

## 2012-12-13 ENCOUNTER — Encounter: Payer: Self-pay | Admitting: Sports Medicine

## 2012-12-13 ENCOUNTER — Ambulatory Visit
Admission: RE | Admit: 2012-12-13 | Discharge: 2012-12-13 | Disposition: A | Discharge status: Home / Self Care | Payer: Medicaid Other | Source: Ambulatory Visit | Attending: Sports Medicine | Admitting: Sports Medicine

## 2012-12-13 ENCOUNTER — Other Ambulatory Visit: Payer: Self-pay | Admitting: Sports Medicine

## 2012-12-13 ENCOUNTER — Ambulatory Visit (INDEPENDENT_AMBULATORY_CARE_PROVIDER_SITE_OTHER): Payer: Medicaid Other | Admitting: Sports Medicine

## 2012-12-13 VITALS — BP 122/78 | HR 81 | Ht 68.0 in | Wt 200.0 lb

## 2012-12-13 DIAGNOSIS — M25552 Pain in left hip: Secondary | ICD-10-CM

## 2012-12-13 DIAGNOSIS — M25559 Pain in unspecified hip: Secondary | ICD-10-CM

## 2012-12-13 NOTE — Patient Instructions (Addendum)
You have been scheduled for a bone density scan on 12/16/12 at 3:30 pm at [facility]  [address] [place] 4th floor- room [phone]

## 2012-12-13 NOTE — Progress Notes (Addendum)
  Subjective:    Patient ID: [name] [name], female    DOB: January 15, 1966, 47 y.o.   MRN: [id number]  HPI chief complaint: Left hip pain  Very pleasant 47 year old female comes in today complaining of one and half years of left lateral hip pain. No trauma but rather a gradual onset of pain that is aching in quality. Pain will radiate down the front of her thigh to her knee. Intermittent numbness and tingling in the left leg. She is also began to notice some swelling in the left lower extremity over the past week or so. Swelling improves overnight and is most noticeable after standing for long periods of time. She denies any groin pain. She's been offered a cortisone injection in the past for her hip pain but once to avoid these out of fear of hyperglycemia. He has just recently got her diabetes under control to the point that she no longer requires insulin. Her hip pain is most noticeable at night. She has pain when sleeping on the affected side as well as with sleeping on her unaffected right hip. No prior hip surgery.  Past medical history and current medications are reviewed. Medical history is positive for sarcoidosis. 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 This was done by Dr. Newell Coral. She is currently on Voltaren gel and Ultram for her hip pain. Voltaren gel does seem to be helpful. Ultram is ineffective. Socially she does not smoke, she denies any alcohol use, and is disabled.    Review of Systems     Objective:   Physical Exam Well-developed, well-nourished. No acute distress. Awake alert and oriented x3.  Left hip: Smooth painless hip range of motion. Negative log roll. Negative straight leg raise. There is diffuse tenderness to palpation along the left lateral hip. No soft tissue swelling in this area but she does have 2+ pitting edema in the left lower leg from the mid tibia down. There are no focal neurological deficits of either lower extremity. She  walks without significant limp.       Assessment & Plan:  1. Left hip pain likely secondary to tensor fascia lata strain 2. Left lower extremity edema  Given her length of symptoms I like to order a plain x-ray of the left hip. Patient is also concerned about her bone density. She has a history of osteopenia. Last bone density study was in 2007. We will go ahead and order an updated study. I've provided her with 3 tensor fascia lata/IT band stretches. She will continue with her Voltaren gel but will discontinue the Ultram since it is ineffective. Followup with me after her x-rays and bone density study are complete. Delineate further treatment based on those studies. She may benefit from formal therapy. In regards to her left lower extremity edema, this is not originating from her hip. I recommended the patient return to her PCP to discuss possible workup and treatment options.

## 2012-12-16 ENCOUNTER — Ambulatory Visit
Admission: RE | Admit: 2012-12-16 | Discharge: 2012-12-16 | Disposition: A | Discharge status: Home / Self Care | Payer: Medicaid Other | Source: Ambulatory Visit | Attending: Sports Medicine | Admitting: Sports Medicine

## 2012-12-16 DIAGNOSIS — M25552 Pain in left hip: Secondary | ICD-10-CM

## 2012-12-23 ENCOUNTER — Ambulatory Visit: Payer: Medicaid Other | Admitting: Obstetrics and Gynecology

## 2012-12-23 ENCOUNTER — Other Ambulatory Visit: Payer: Medicaid Other

## 2012-12-23 ENCOUNTER — Encounter: Payer: Self-pay | Admitting: Obstetrics and Gynecology

## 2012-12-23 VITALS — BP 118/80 | Wt 202.0 lb

## 2012-12-23 DIAGNOSIS — R109 Unspecified abdominal pain: Secondary | ICD-10-CM

## 2012-12-23 DIAGNOSIS — N83209 Unspecified ovarian cyst, unspecified side: Secondary | ICD-10-CM

## 2012-12-23 NOTE — Progress Notes (Signed)
Pt states she feel better BP 118/80  Wt 202 lb (91.627 kg)  LMP 12/03/2011 US WNL ovarian cyst resolved.  Pt need to f/u at [facility]

## 2012-12-27 ENCOUNTER — Ambulatory Visit (INDEPENDENT_AMBULATORY_CARE_PROVIDER_SITE_OTHER): Payer: Medicaid Other | Admitting: Sports Medicine

## 2012-12-27 ENCOUNTER — Encounter: Payer: Self-pay | Admitting: Sports Medicine

## 2012-12-27 VITALS — BP 124/81 | HR 85 | Ht 68.0 in | Wt 202.0 lb

## 2012-12-27 DIAGNOSIS — M25559 Pain in unspecified hip: Secondary | ICD-10-CM

## 2012-12-27 DIAGNOSIS — M7060 Trochanteric bursitis, unspecified hip: Secondary | ICD-10-CM

## 2012-12-27 DIAGNOSIS — M25552 Pain in left hip: Secondary | ICD-10-CM

## 2012-12-28 DIAGNOSIS — M7060 Trochanteric bursitis, unspecified hip: Secondary | ICD-10-CM | POA: Insufficient documentation

## 2012-12-28 NOTE — Progress Notes (Signed)
  Subjective:    Patient ID: [name] [name], female    DOB: 05-24-1966, 47 y.o.   MRN: [id number]  HPI Patient comes in today for followup. X-rays of her left hip are unremarkable. Her DEXA scan was also unremarkable. No evidence of osteopenia or osteoporosis. She continues to localize her pain to the lateral hip. Symptoms have been present now for one and a half years.    Review of Systems     Objective:   Physical Exam Well-developed, well-nourished. No acute distress. Awake alert and oriented x3  Left hip: Smooth painless hip range of motion with a negative log roll. Diffuse tenderness along the lateral hip including over the greater trochanteric bursa. Negative straight leg raise. Neurovascularly intact distally. Walking without a significant limp.      Assessment & Plan:  1. 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 She does not want to do the injection. Instead, I will refer her to physical therapy. She will wean to exercise program as tolerated. I've given her patient information regarding her diagnosis. If symptoms worsen to the point that she would like to reconsider an injection then she can call for a followup appointment. Otherwise, followup when necessary.

## 2013-01-26 ENCOUNTER — Ambulatory Visit (HOSPITAL_COMMUNITY)
Admission: RE | Admit: 2013-01-26 | Discharge: 2013-01-26 | Disposition: A | Discharge status: Home / Self Care | Payer: Medicaid Other | Source: Ambulatory Visit | Attending: Internal Medicine | Admitting: Internal Medicine

## 2013-01-26 ENCOUNTER — Ambulatory Visit (INDEPENDENT_AMBULATORY_CARE_PROVIDER_SITE_OTHER): Payer: Medicaid Other | Admitting: Internal Medicine

## 2013-01-26 ENCOUNTER — Ambulatory Visit (HOSPITAL_COMMUNITY): Payer: Medicaid Other

## 2013-01-26 VITALS — BP 132/83 | HR 84 | Temp 97.2°F | Wt 204.8 lb

## 2013-01-26 DIAGNOSIS — R0789 Other chest pain: Secondary | ICD-10-CM

## 2013-01-26 DIAGNOSIS — R079 Chest pain, unspecified: Secondary | ICD-10-CM

## 2013-01-26 DIAGNOSIS — M542 Cervicalgia: Secondary | ICD-10-CM

## 2013-01-26 DIAGNOSIS — M25511 Pain in right shoulder: Secondary | ICD-10-CM

## 2013-01-26 DIAGNOSIS — M25519 Pain in unspecified shoulder: Secondary | ICD-10-CM

## 2013-01-26 HISTORY — DX: Other chest pain: R07.89

## 2013-01-26 LAB — BASIC METABOLIC PANEL WITH GFR
CO2: 31 mEq/L (ref 19–32)
Calcium: 8.8 mg/dL (ref 8.4–10.5)
Creat: 0.74 mg/dL (ref 0.50–1.10)
Glucose, Bld: 121 mg/dL — ABNORMAL HIGH (ref 70–99)
Sodium: 144 mEq/L (ref 135–145)

## 2013-01-26 LAB — TROPONIN I: Troponin I: <0.3 ng/mL (ref <0.30)

## 2013-01-26 MED ORDER — IBUPROFEN 600 MG PO TABS: 600.0000 mg | ORAL_TABLET | Freq: Four times a day (QID) | ORAL | Status: DC | PRN

## 2013-01-26 MED ORDER — ESOMEPRAZOLE MAGNESIUM 40 MG PO CPDR: DELAYED_RELEASE_CAPSULE | ORAL | Status: DC

## 2013-01-26 MED ORDER — IOHEXOL 350 MG/ML SOLN: 100.0000 mL | Freq: Once | INTRAVENOUS | Status: AC | PRN

## 2013-01-26 NOTE — Progress Notes (Signed)
Patient ID: FRANCE LUSTY, female   DOB: December 24, 1965, 47 y.o.   MRN: [id number] History of present illness: Ms. Niese is a 47 year woman with past medical history of diabetes, sarcoidosis, hypertension, hyperlipidemia, chronic fatigue, chronic left hip pain secondary to greater trochanteric bursitis who recently saw Dr. Margaretha Sheffield with sport medicine for left hip pain presents today for:  Shoulder pain x 1 month or longer, sometimes in neck. She thinks she have might lost balance and hit the corner of the wall 2 months and hitting the right shoulder.  No fever, chills.     Chest pain: 3 days ago , she had chest pain locating on left chest, sometimes sharp, nonexertional.  Each episode lasts about 30 mins and resolve but then comes back.  4/10 in severity.  No exac or alleviation factor.  +nausea started last night but no vomiting. No diaphoresis.  No radiation.  No previous similar episodes in the past. Family history of cardiac disease but she does not know the exact age.  She has hx of GERD but she ran out of nexium in 3 wks.  She had left leg swelling about 1 month ago but the leg swelling resolve.  Has family history of blood clot in both of her sisters.  She denies any SOB.  No recent immobilization, cancer, surgery.  Review of system: As per history of present illness  Physical examination: General: alert, well-developed, and cooperative to examination.  Lungs: normal respiratory effort, no accessory muscle use, normal breath sounds, no crackles, and no wheezes. Heart: normal rate, regular rhythm, no murmur, no gallop, and no rub. NO tenderness to palpation of chest wall Abdomen: soft, non-tender, normal bowel sounds, no distention, no guarding, no rebound tenderness Neurologic: nonfocal Skin: turgor normal and no rashes. EXT: No LE edema noted, no tenderness to palpation.   Right shoulder: limited ROM due to pain. Unable to perform apley's back scratch test. Neg drop arm test.  +crepitus noted.   Neck: full ROM, no tenderness to palpation.   Psych: appropriate

## 2013-01-26 NOTE — Patient Instructions (Addendum)
Will get labs and will inform you with abnormal results. Will also get Xrays and CT chest You can take Ibuprofen as needed for pain Take NExium for your GERD Follow up with Dr. Margaretha Sheffield

## 2013-01-26 NOTE — Assessment & Plan Note (Signed)
Unclear etiology at this time. Neg drop arm test makes rotator cuff tear unlikely.  Patient did have a fall 2 months ago I doubt it is dislocated.  She reports pain started from her neck that radiates down to her arm which is concerning for impingement. -Will get neck and right shoulder x-ray -NSAID for pain control- normal Cr in November -Will need to followup with Dr. Margaretha Sheffield

## 2013-01-26 NOTE — Assessment & Plan Note (Addendum)
This is likely secondary to GERD since patient has ran out of her medication in the past 3 weeks. However patient does report a strong family history of blood clot in both of her sister and that she also had left leg swelling a bowel movement ago which has now resolved, concerning for pulmonary embolism. A revised Geneva score is 7 (3 heart rate and 4 for unilateral limb edema (last month but now resolved) which is intermediate probability. Other differential diagnoses include MI which is less likely but she does have risk factors including hypertension, diabetes, hyperlipidemia. EKG today shows new T wave inversion in V1-3.  She did have small apical defect on myoview in 04/2011.   -Resume Nexium-will need to call for prior authorization -Will check cardiac enzymes, d-dimer, EKG- new T wave inversion in V1-3.   -Will get CT angiography to rule out PE -Will refer back to cardiology for repeat stress test given new T wave changes

## 2013-01-26 NOTE — Addendum Note (Signed)
Addended by: Carrolyn Meiers T on: 01/26/2013 02:56 PM   Modules accepted: Orders

## 2013-01-27 ENCOUNTER — Encounter: Payer: Self-pay | Admitting: Internal Medicine

## 2013-01-27 NOTE — Addendum Note (Signed)
Addended by: Maura Crandall on: 01/27/2013 08:55 AM   Modules accepted: Orders

## 2013-01-28 ENCOUNTER — Telehealth: Payer: Self-pay | Admitting: *Deleted

## 2013-01-28 NOTE — Telephone Encounter (Signed)
Received PA request for pt's nexium 40 mg caps.  Pt has tried and failed protonix and omeprazole in the past.  Submitted request online through [employer].[name] [name], [name] Adamcik Cassady2/28/20148:56 AM     [employer] #: [id number] W Prior Approval #: [id number] .

## 2013-01-31 ENCOUNTER — Ambulatory Visit (HOSPITAL_COMMUNITY)
Admission: RE | Admit: 2013-01-31 | Discharge: 2013-01-31 | Disposition: A | Discharge status: Home / Self Care | Payer: Medicaid Other | Source: Ambulatory Visit | Attending: Internal Medicine | Admitting: Internal Medicine

## 2013-01-31 DIAGNOSIS — M542 Cervicalgia: Secondary | ICD-10-CM

## 2013-01-31 DIAGNOSIS — M25511 Pain in right shoulder: Secondary | ICD-10-CM

## 2013-02-09 DIAGNOSIS — R269 Unspecified abnormalities of gait and mobility: Secondary | ICD-10-CM | POA: Insufficient documentation

## 2013-02-09 DIAGNOSIS — S139XXA Sprain of joints and ligaments of unspecified parts of neck, initial encounter: Secondary | ICD-10-CM | POA: Insufficient documentation

## 2013-02-09 DIAGNOSIS — Z5181 Encounter for therapeutic drug level monitoring: Secondary | ICD-10-CM | POA: Insufficient documentation

## 2013-02-09 DIAGNOSIS — M79609 Pain in unspecified limb: Secondary | ICD-10-CM | POA: Insufficient documentation

## 2013-02-09 DIAGNOSIS — G43019 Migraine without aura, intractable, without status migrainosus: Secondary | ICD-10-CM | POA: Insufficient documentation

## 2013-02-11 ENCOUNTER — Other Ambulatory Visit: Payer: Self-pay | Admitting: Neurology

## 2013-02-11 DIAGNOSIS — G43019 Migraine without aura, intractable, without status migrainosus: Secondary | ICD-10-CM

## 2013-02-11 DIAGNOSIS — M79609 Pain in unspecified limb: Secondary | ICD-10-CM

## 2013-02-11 DIAGNOSIS — D8689 Sarcoidosis of other sites: Secondary | ICD-10-CM

## 2013-02-11 DIAGNOSIS — R269 Unspecified abnormalities of gait and mobility: Secondary | ICD-10-CM

## 2013-02-11 DIAGNOSIS — R51 Headache: Secondary | ICD-10-CM

## 2013-02-17 ENCOUNTER — Ambulatory Visit
Admission: RE | Admit: 2013-02-17 | Discharge: 2013-02-17 | Disposition: A | Discharge status: Home / Self Care | Payer: Medicaid Other | Source: Ambulatory Visit | Attending: Neurology | Admitting: Neurology

## 2013-02-17 DIAGNOSIS — R269 Unspecified abnormalities of gait and mobility: Secondary | ICD-10-CM

## 2013-02-17 DIAGNOSIS — R51 Headache: Secondary | ICD-10-CM

## 2013-02-17 DIAGNOSIS — D8689 Sarcoidosis of other sites: Secondary | ICD-10-CM

## 2013-02-17 DIAGNOSIS — M79609 Pain in unspecified limb: Secondary | ICD-10-CM

## 2013-02-17 DIAGNOSIS — G43019 Migraine without aura, intractable, without status migrainosus: Secondary | ICD-10-CM

## 2013-02-17 MED ORDER — GADOBENATE DIMEGLUMINE 529 MG/ML IV SOLN
18.0000 mL | Freq: Once | INTRAVENOUS | Status: AC | PRN
  Administered 2013-02-17: 18 mL via INTRAVENOUS

## 2013-02-22 ENCOUNTER — Telehealth: Payer: Self-pay | Admitting: Neurology

## 2013-02-22 NOTE — Telephone Encounter (Signed)
I called patient. The MRI scan of the cervical spine is unremarkable. No change from 2009. The patient will be getting into neuromuscular therapy through physical therapy. No evidence of involvement of sarcoidosis with a spinal cord.

## 2013-03-05 ENCOUNTER — Other Ambulatory Visit: Payer: Self-pay | Admitting: Neurology

## 2013-03-08 ENCOUNTER — Encounter: Payer: Self-pay | Admitting: Cardiology

## 2013-03-08 ENCOUNTER — Ambulatory Visit (INDEPENDENT_AMBULATORY_CARE_PROVIDER_SITE_OTHER): Payer: Medicaid Other | Admitting: Cardiology

## 2013-03-08 ENCOUNTER — Ambulatory Visit: Payer: Medicaid Other | Admitting: Physical Therapy

## 2013-03-08 VITALS — BP 120/74 | HR 83 | Ht 68.0 in | Wt 201.0 lb

## 2013-03-08 DIAGNOSIS — R9431 Abnormal electrocardiogram [ECG] [EKG]: Secondary | ICD-10-CM

## 2013-03-08 DIAGNOSIS — E785 Hyperlipidemia, unspecified: Secondary | ICD-10-CM

## 2013-03-08 DIAGNOSIS — I10 Essential (primary) hypertension: Secondary | ICD-10-CM

## 2013-03-08 DIAGNOSIS — R079 Chest pain, unspecified: Secondary | ICD-10-CM

## 2013-03-08 NOTE — Assessment & Plan Note (Signed)
Symptoms atypical and somewhat chronic. Plan stress echocardiogram.

## 2013-03-08 NOTE — Assessment & Plan Note (Signed)
Continue statin. Lipids and liver monitored by primary care. 

## 2013-03-08 NOTE — Assessment & Plan Note (Signed)
No history of syncope. ?

## 2013-03-08 NOTE — Assessment & Plan Note (Signed)
Blood pressure controlled. Continue present medications. 

## 2013-03-08 NOTE — Patient Instructions (Addendum)
Your physician wants you to follow-up in: ONE YEAR WITH DR CRENSHAW You will receive a reminder letter in the mail two months in advance. If you don't receive a letter, please call our office to schedule the follow-up appointment.   Your physician has requested that you have a stress echocardiogram. For further information please visit [email or web address]. Please follow instruction sheet as given.   

## 2013-03-08 NOTE — Progress Notes (Signed)
HPI: [name] [name] is a pleasant female with long history of atypical chest pain, sarcoidosis, diabetes, and previously diagnosed long QT. Her last Myoview was performed in May of 2012. At that time, she was found to have an ejection fraction of 49% with a small apical defect most likely from soft tissue attenuation. Small prior infarct could not be excluded. Echocardiogram in July 2005 showed normal LV function. I last saw her in July of 2013. Since then she was seen by primary care in February of 2014 with chest pain. D-dimer normal. Enzymes negative. Electrocardiogram was several anterior T-wave changes. Her chest pain is under her left breast and described as a sharp pain. It lasts 2 minutes and resolves spontaneously. No radiation or associated symptoms. She does not have exertional chest pain. No dyspnea or syncope.  Current Outpatient Prescriptions  Medication Sig Dispense Refill  . amLODipine (NORVASC) 10 MG tablet TAKE 1 TABLET (10 MG TOTAL) BY MOUTH DAILY.  30 tablet  11  . aspirin 81 MG tablet Take 81 mg by mouth daily.        Marland Kitchen atorvastatin (LIPITOR) 20 MG tablet TAKE 1 TABLET BY MOUTH EVERY DAY  31 tablet  10  . diclofenac sodium (VOLTAREN) 1 % GEL Apply 4 g topically 4 (four) times daily.  1 Tube  3  . esomeprazole (NEXIUM) 40 MG capsule Take one tablet 30 minutes before meal  31 capsule  3  . Fluticasone-Salmeterol (ADVAIR) 100-50 MCG/DOSE AEPB Inhale 2 puffs into the lungs as needed.      . furosemide (LASIX) 40 MG tablet Take 1 tablet (40 mg total) by mouth 2 (two) times daily.  180 tablet  1  . gabapentin (NEURONTIN) 300 MG capsule Take 1 capsule (300 mg total) by mouth 2 (two) times daily.  62 capsule  3  . glipiZIDE (GLUCOTROL) 10 MG tablet Take 5 mg by mouth as needed.      Marland Kitchen ibuprofen (ADVIL,MOTRIN) 600 MG tablet Take 1 tablet (600 mg total) by mouth every 6 (six) hours as needed for pain.  30 tablet  0  . levothyroxine (SYNTHROID, LEVOTHROID) 137 MCG tablet TAKE 1 TABLET EVERY  DAY  30 tablet  5  . metFORMIN (GLUCOPHAGE) 500 MG tablet TAKE 1 TABLET (500 MG TOTAL) BY MOUTH 2 (TWO) TIMES DAILY WITH A MEAL.  180 tablet  10  . methotrexate (RHEUMATREX) 2.5 MG tablet TAKE 4 TABLETS ON THURSDAY  16 tablet  12  . Multiple Vitamin (MULTIVITAMIN) tablet Take 1 tablet by mouth daily.      . pantoprazole (PROTONIX) 40 MG tablet Take 1 tablet (40 mg total) by mouth daily.  30 tablet  6   No current facility-administered medications for this visit.     Past Medical History  Diagnosis Date  . Sarcoidosis   . OSA (obstructive sleep apnea)   . Prolonged QT interval   . Osteoporosis   . Anemia   . GERD (gastroesophageal reflux disease)   . DM (diabetes mellitus)   . Peripheral neuropathy   . HTN (hypertension)   . Nephrolithiasis   . Chronic fatigue     Past Surgical History  Procedure Laterality Date  . Hysteroscopy    . Dilation and curettage of uterus    . Partial hysterectomy  2013    History   Social History  . Marital Status: Married    Spouse Name: N/A    Number of Children: N/A  . Years of Education: 110  Occupational History  .     Social History Main Topics  . Smoking status: Never Smoker   . Smokeless tobacco: Never Used  . Alcohol Use: No  . Drug Use: No  . Sexually Active: Not on file     Comment: hysterectomy   Other Topics Concern  . Not on file   Social History Narrative   Lives in [place] with 2 sons and her husband;disabled; no use of tobacco products nor alcohol.   Patient given diabetes card on 12/05/2010.    ROS: mild pedal edema but no fevers or chills, productive cough, hemoptysis, dysphasia, odynophagia, melena, hematochezia, dysuria, hematuria, rash, seizure activity, orthopnea, PND, claudication. Remaining systems are negative.  Physical Exam: Well-developed well-nourished in no acute distress.  Skin is warm and dry.  HEENT is normal.  Neck is supple.  Chest is clear to auscultation with normal expansion.    Cardiovascular exam is regular rate and rhythm.  Abdominal exam nontender or distended. No masses palpated. Extremities show trace edema. neuro grossly intact  ECG sinus rhythm at a rate of 83. Nonspecific ST changes.

## 2013-03-11 ENCOUNTER — Other Ambulatory Visit: Payer: Self-pay | Admitting: Internal Medicine

## 2013-03-14 ENCOUNTER — Ambulatory Visit: Payer: Medicaid Other | Attending: Neurology | Admitting: Physical Therapy

## 2013-03-14 DIAGNOSIS — IMO0001 Reserved for inherently not codable concepts without codable children: Secondary | ICD-10-CM | POA: Insufficient documentation

## 2013-03-14 DIAGNOSIS — M25519 Pain in unspecified shoulder: Secondary | ICD-10-CM | POA: Insufficient documentation

## 2013-03-14 DIAGNOSIS — M542 Cervicalgia: Secondary | ICD-10-CM | POA: Insufficient documentation

## 2013-03-15 ENCOUNTER — Ambulatory Visit (HOSPITAL_COMMUNITY): Payer: Medicaid Other | Attending: Cardiology | Admitting: Radiology

## 2013-03-15 ENCOUNTER — Ambulatory Visit (HOSPITAL_BASED_OUTPATIENT_CLINIC_OR_DEPARTMENT_OTHER): Payer: Medicaid Other

## 2013-03-15 ENCOUNTER — Encounter: Payer: Self-pay | Admitting: Cardiology

## 2013-03-15 DIAGNOSIS — R0989 Other specified symptoms and signs involving the circulatory and respiratory systems: Secondary | ICD-10-CM

## 2013-03-15 DIAGNOSIS — R9431 Abnormal electrocardiogram [ECG] [EKG]: Secondary | ICD-10-CM

## 2013-03-15 DIAGNOSIS — R072 Precordial pain: Secondary | ICD-10-CM

## 2013-03-15 DIAGNOSIS — R079 Chest pain, unspecified: Secondary | ICD-10-CM | POA: Insufficient documentation

## 2013-03-15 NOTE — Progress Notes (Signed)
Stress Echocardiogram performed.  

## 2013-03-24 ENCOUNTER — Ambulatory Visit: Payer: Medicaid Other

## 2013-03-28 ENCOUNTER — Encounter: Payer: Self-pay | Admitting: Internal Medicine

## 2013-03-28 ENCOUNTER — Ambulatory Visit (INDEPENDENT_AMBULATORY_CARE_PROVIDER_SITE_OTHER): Payer: Medicaid Other | Admitting: Internal Medicine

## 2013-03-28 VITALS — BP 114/67 | HR 82 | Temp 98.1°F | Ht 68.0 in | Wt 196.1 lb

## 2013-03-28 DIAGNOSIS — E119 Type 2 diabetes mellitus without complications: Secondary | ICD-10-CM

## 2013-03-28 DIAGNOSIS — K219 Gastro-esophageal reflux disease without esophagitis: Secondary | ICD-10-CM

## 2013-03-28 DIAGNOSIS — J309 Allergic rhinitis, unspecified: Secondary | ICD-10-CM | POA: Insufficient documentation

## 2013-03-28 DIAGNOSIS — K5900 Constipation, unspecified: Secondary | ICD-10-CM

## 2013-03-28 DIAGNOSIS — I10 Essential (primary) hypertension: Secondary | ICD-10-CM

## 2013-03-28 DIAGNOSIS — M7061 Trochanteric bursitis, right hip: Secondary | ICD-10-CM

## 2013-03-28 DIAGNOSIS — D8689 Sarcoidosis of other sites: Secondary | ICD-10-CM

## 2013-03-28 DIAGNOSIS — E039 Hypothyroidism, unspecified: Secondary | ICD-10-CM

## 2013-03-28 DIAGNOSIS — M76899 Other specified enthesopathies of unspecified lower limb, excluding foot: Secondary | ICD-10-CM

## 2013-03-28 DIAGNOSIS — M25511 Pain in right shoulder: Secondary | ICD-10-CM

## 2013-03-28 DIAGNOSIS — M25519 Pain in unspecified shoulder: Secondary | ICD-10-CM

## 2013-03-28 DIAGNOSIS — M542 Cervicalgia: Secondary | ICD-10-CM

## 2013-03-28 LAB — GLUCOSE, CAPILLARY: Glucose-Capillary: 102 mg/dL — ABNORMAL HIGH (ref 70–99)

## 2013-03-28 LAB — POCT GLYCOSYLATED HEMOGLOBIN (HGB A1C): Hemoglobin A1C: 6.6

## 2013-03-28 MED ORDER — DOCUSATE SODIUM 100 MG PO CAPS: 100.0000 mg | ORAL_CAPSULE | Freq: Every day | ORAL | Status: DC | PRN

## 2013-03-28 MED ORDER — SODIUM CHLORIDE 0.65 % NA SOLN: 1.0000 | NASAL | Status: DC | PRN

## 2013-03-28 MED ORDER — FLUTICASONE PROPIONATE 50 MCG/ACT NA SUSP: 2.0000 | Freq: Every day | NASAL | Status: DC

## 2013-03-28 MED ORDER — CHLORPHENIRAMINE MALEATE 4 MG PO TABS: 4.0000 mg | ORAL_TABLET | Freq: Two times a day (BID) | ORAL | Status: DC | PRN

## 2013-03-28 NOTE — Assessment & Plan Note (Signed)
Lab Results  Component Value Date   HGBA1C 6.6 03/28/2013   HGBA1C 6.3 12/06/2012   HGBA1C 6.2 09/13/2012     Assessment: Diabetes control: good control (HgbA1C at goal) Progress toward A1C goal:  at goal Comments: secondary to chronic steroids from sarcoidosis  Plan: Medications:  continue current medications Home glucose monitoring: Frequency:   Timing:   Instruction/counseling given: reminded to bring medications to each visit Educational resources provided: brochure Self management tools provided:   Other plans: cont metformin 500 mg bid, and glucotrol 5 mg

## 2013-03-28 NOTE — Progress Notes (Signed)
Subjective:    Patient ID: [name] [name], female    DOB: 1966-02-20, 47 y.o.   MRN: [id number]  HPI  [name] [name] has history significant for steroid-induced diabetes mellitus well controlled, hypertension, hyperlipidemia, osteoporosis, h/o Neurosarcoidosis. She presents today with complaints of nasal congestion, runny nose, cough she denies previous seasonal allergies but states that over the past week the symptoms have caused her to feel fatigued and sick.   Currently involved in physical therapy for right shoulder neck pain but states that there has not been significant improvement and continues to have deep discomfort. She is unable to move her right arm above horizontal and has positive Neer"s and Hawkins maneuver concerning for shoulder impingment. MRI of cervical spine March 2014 notable for trivial disc bulge C4-5 and C5-6 but no spinal or foraminal stenosis. She has many concerns about the possibility of return of her neurosarcoidosis and states that she is followed by Dr. Anne Hahn of [facility].  Review of Systems  Constitutional: Positive for fatigue. Negative for fever.  HENT: Positive for congestion, rhinorrhea, neck pain and postnasal drip. Negative for nosebleeds, sore throat, sneezing, trouble swallowing and neck stiffness.   Gastrointestinal: Negative for nausea.  Musculoskeletal:       + neck and right shoulder pain  Neurological: Positive for headaches. Negative for syncope, speech difficulty, weakness and light-headedness.  Psychiatric/Behavioral: Negative for dysphoric mood.       Objective:   Physical Exam  Constitutional: She is oriented to person, place, and time. She appears well-developed and well-nourished. She appears distressed.  HENT:  Head: Normocephalic and atraumatic.  Right Ear: Hearing, tympanic membrane, external ear and ear canal normal.  Left Ear: Hearing, tympanic membrane, external ear and ear canal normal.  Nose: Mucosal edema and rhinorrhea  present. No sinus tenderness or nasal deformity. No epistaxis. Right sinus exhibits no maxillary sinus tenderness and no frontal sinus tenderness. Left sinus exhibits no maxillary sinus tenderness and no frontal sinus tenderness.  Mouth/Throat: No oropharyngeal exudate, posterior oropharyngeal edema or posterior oropharyngeal erythema.    Eyes: Conjunctivae and EOM are normal. Pupils are equal, round, and reactive to light.  Neck: Trachea normal. Muscular tenderness present. No spinous process tenderness present. No rigidity. Decreased range of motion present. No mass present.    Tenderness and decreased neck rotation bilateral directions  Musculoskeletal:       Right shoulder: She exhibits decreased range of motion and tenderness. She exhibits no swelling, no effusion and no crepitus.       Arms: Positive Neer's, Positive Hawking's signs, cannot move should towards horizontal w/o whincing  Neurological: She is alert and oriented to person, place, and time.  Skin: Skin is warm and dry. No erythema.  Psychiatric: She has a normal mood and affect.          Assessment & Plan:  1. Neck/Shoulder pain: concerning for shoulder impingement, pt already received MRI of cervical spine with minimal disc bulge, she is currently receiving Physical therapy but states that he symptoms only improve when the heat is applied and quickly returns thereafter.  Pt is hesitent re: possibililty of steroid injections due to diabetes concern -refer Ortho for further evaluation  2. H/o Neurosarcoid: pt with many questions concerning her status and whether her symptoms may be related, explained to pt that there are no findings thus far but will defer to Dr. Anne Hahn (Neurology) for further assessment -appt Dr. Anne Hahn  3. Allergic Rhinitis: no worrisome sx in diabetic such as Mucor, no  sinus tenderness, no purulent drainage visualized -will give short course chlorpheniramine x 7 days with nasal fluticasone and saline  nasal spray -may need to be on long term antihistamine ie Zyrtec for control  4. Diabetes Mellitus: controlled with HgbA1c 6.6.   5. Hypertension: controlled on CCB and diuretic  6. Hypopthyroidism: controlled, next TSH check due Oct 2014

## 2013-03-28 NOTE — Assessment & Plan Note (Signed)
BP Readings from Last 3 Encounters:  03/28/13 114/67  03/08/13 120/74  01/26/13 132/83    Lab Results  Component Value Date   NA 144 01/26/2013   K 3.5 01/26/2013   CREATININE 0.74 01/26/2013    Assessment: Blood pressure control: controlled Progress toward BP goal:  at goal Comments: on ccb, loop diuretic  Plan: Medications:  continue current medications Educational resources provided: brochure Self management tools provided: home blood pressure logbook Other plans: cont amlodipine 10 mg qd and lasix 40 mg qd

## 2013-03-28 NOTE — Patient Instructions (Addendum)
General Instructions: For your sinuses, we have prescribed an antihistamine pill and antihistamine nasal spray.  Please use the saline (water) nasal spray as well. We have put in for another referral to Dr. Anne Hahn of Neurology who can go over the MRI results with you as I did today in addition to further options for your neck pain. We will schedule an appointment with Orthopedics to evaluate your continued right shoulder pain since it has not improved with Physical Therapy. Continue to take your medications as prescribed and follow-up with me in 4 months or earlier if needed. We will do a foot check and schedule you for eye exam at that time.   Treatment Goals:  Goals (1 Years of Data) as of 03/28/13         As of Today 03/08/13 01/26/13 12/27/12 12/23/12     Blood Pressure    . Blood Pressure < 140/90  114/67 120/74 132/83 124/81 118/80     Result Component    . HEMOGLOBIN A1C < 7.0  6.6        . LDL CALC < 100            Progress Toward Treatment Goals:  Treatment Goal 03/28/2013  Hemoglobin A1C at goal  Blood pressure at goal    Self Care Goals & Plans:  Self Care Goal 03/28/2013  Manage my medications take my medicines as prescribed; bring my medications to every visit; refill my medications on time  Eat healthy foods drink diet soda or water instead of juice or soda; eat more vegetables; eat foods that are low in salt; eat baked foods instead of fried foods  Be physically active -       Care Management & Community Referrals:

## 2013-03-29 ENCOUNTER — Ambulatory Visit: Payer: Medicaid Other | Admitting: Physical Therapy

## 2013-04-04 NOTE — Progress Notes (Signed)
Case discussed with Dr. Karen Schooler  at the time of the visit, immediately after the resident saw the patient.  I reviewed the resident's history and exam and pertinent patient test results.  I agree with the assessment, diagnosis and plan of care documented in the resident's note.  

## 2013-04-07 ENCOUNTER — Ambulatory Visit: Payer: Medicaid Other | Attending: Neurology | Admitting: Physical Therapy

## 2013-04-07 DIAGNOSIS — M25519 Pain in unspecified shoulder: Secondary | ICD-10-CM | POA: Insufficient documentation

## 2013-04-07 DIAGNOSIS — IMO0001 Reserved for inherently not codable concepts without codable children: Secondary | ICD-10-CM | POA: Insufficient documentation

## 2013-04-07 DIAGNOSIS — M542 Cervicalgia: Secondary | ICD-10-CM | POA: Insufficient documentation

## 2013-04-12 ENCOUNTER — Other Ambulatory Visit: Payer: Self-pay | Admitting: Internal Medicine

## 2013-04-14 ENCOUNTER — Ambulatory Visit: Payer: Medicaid Other

## 2013-04-15 ENCOUNTER — Encounter: Payer: Self-pay | Admitting: Neurology

## 2013-04-15 ENCOUNTER — Ambulatory Visit (INDEPENDENT_AMBULATORY_CARE_PROVIDER_SITE_OTHER): Payer: Medicaid Other | Admitting: Neurology

## 2013-04-15 VITALS — BP 133/82 | HR 70 | Wt 200.0 lb

## 2013-04-15 DIAGNOSIS — D869 Sarcoidosis, unspecified: Secondary | ICD-10-CM

## 2013-04-15 DIAGNOSIS — R609 Edema, unspecified: Secondary | ICD-10-CM

## 2013-04-15 DIAGNOSIS — G43019 Migraine without aura, intractable, without status migrainosus: Secondary | ICD-10-CM

## 2013-04-15 DIAGNOSIS — R269 Unspecified abnormalities of gait and mobility: Secondary | ICD-10-CM

## 2013-04-15 DIAGNOSIS — Z5181 Encounter for therapeutic drug level monitoring: Secondary | ICD-10-CM

## 2013-04-15 DIAGNOSIS — D8689 Sarcoidosis of other sites: Secondary | ICD-10-CM

## 2013-04-15 DIAGNOSIS — S139XXD Sprain of joints and ligaments of unspecified parts of neck, subsequent encounter: Secondary | ICD-10-CM

## 2013-04-15 DIAGNOSIS — M79609 Pain in unspecified limb: Secondary | ICD-10-CM

## 2013-04-15 LAB — CBC WITH DIFFERENTIAL
Basophils Absolute: 0 10*3/uL (ref 0.0–0.2)
Basos: 1 % (ref 0–3)
Eosinophils Absolute: 0.1 10*3/uL (ref 0.0–0.4)
Hemoglobin: 11.9 g/dL (ref 11.1–15.9)
Lymphs: 36 % (ref 14–46)
MCHC: 33.7 g/dL (ref 31.5–35.7)
Monocytes Absolute: 0.4 10*3/uL (ref 0.1–0.9)
Neutrophils Relative %: 52 % (ref 40–74)
Platelets: 266 10*3/uL (ref 155–379)

## 2013-04-15 LAB — COMPREHENSIVE METABOLIC PANEL
ALT: 19 IU/L (ref 0–32)
AST: 18 IU/L (ref 0–40)
Albumin/Globulin Ratio: 1.6 (ref 1.1–2.5)
Alkaline Phosphatase: 58 IU/L (ref 39–117)
BUN/Creatinine Ratio: 19 (ref 9–23)
CO2: 27 mmol/L (ref 19–28)
Calcium: 8.9 mg/dL (ref 8.7–10.2)
Potassium: 3.9 mmol/L (ref 3.5–5.2)
Sodium: 141 mmol/L (ref 134–144)

## 2013-04-15 MED ORDER — TRAZODONE HCL 150 MG PO TABS: 150.0000 mg | ORAL_TABLET | Freq: Every day | ORAL | Status: DC

## 2013-04-15 NOTE — Progress Notes (Signed)
Reason for visit: Neurosarcoidosis  [name] [name] is an 47 y.o. female  History of present illness:  [name] [name] is a 47 year old right-handed black female with a history of neurosarcoidosis. The patient continues to have some headaches, but she also describes some neck discomfort and right shoulder discomfort. The patient indicates that she has gone to an orthopedic surgeon, and they have set her up for a MRI of the right shoulder. The patient has some restriction of movement of the right shoulder. The patient has had MRI evaluation of the cervical spine recently that was unremarkable. No evidence of enhancing lesions are seen, and there is no evidence of spinal cord or nerve root impingement. The patient is sleeping better with the trazodone at 100 mg at night. The patient indicates that she did use a slightly higher dose. The patient returns for an evaluation. The patient indicates that she has had significant swelling of the left lower leg and foot more so than the right over the last 4 weeks. The swelling may go down some at night, but the swelling becomes quite severe towards the late afternoon.  Past Medical History  Diagnosis Date  . Sarcoidosis   . OSA (obstructive sleep apnea)   . Prolonged QT interval   . Osteoporosis   . Anemia   . GERD (gastroesophageal reflux disease)   . DM (diabetes mellitus)   . Peripheral neuropathy   . HTN (hypertension)   . Nephrolithiasis   . Chronic fatigue   . Obesity   . Headache   . Gait disturbance     Past Surgical History  Procedure Laterality Date  . Hysteroscopy    . Dilation and curettage of uterus    . Partial hysterectomy  2013  . Suboccipital craniotomy    . Thyroidectomy      Precancerous lesion    Family History  Problem Relation Age of Onset  . Heart disease Mother     questionable CAD and arrythmias   . Cancer Mother     Breast cancer  . Venous thrombosis Sister   . Diabetes Sister   . Heart disease Sister   .  Diabetes Brother   . Heart disease Brother   . Cancer Maternal Aunt   . Venous thrombosis Sister   . Diabetes Sister   . Heart disease Sister     Social history:  reports that she has never smoked. She has never used smokeless tobacco. She reports that she does not drink alcohol or use illicit drugs.  Allergies: No Known Allergies  Medications:  Current Outpatient Prescriptions on File Prior to Visit  Medication Sig Dispense Refill  . amLODipine (NORVASC) 10 MG tablet TAKE 1 TABLET (10 MG TOTAL) BY MOUTH DAILY.  30 tablet  11  . aspirin 81 MG tablet Take 81 mg by mouth daily.        Marland Kitchen atorvastatin (LIPITOR) 20 MG tablet TAKE 1 TABLET BY MOUTH EVERY DAY  31 tablet  10  . chlorpheniramine (CHLOR-TRIMETON) 4 MG tablet Take 1 tablet (4 mg total) by mouth 2 (two) times daily as needed for allergies.  14 tablet  0  . diclofenac sodium (VOLTAREN) 1 % GEL Apply 4 g topically 4 (four) times daily.  1 Tube  3  . docusate sodium (COLACE) 100 MG capsule Take 1 capsule (100 mg total) by mouth daily as needed for constipation.  30 capsule  1  . esomeprazole (NEXIUM) 40 MG capsule Take one tablet 30 minutes before  meal  31 capsule  3  . fluticasone (FLONASE) 50 MCG/ACT nasal spray Place 2 sprays into the nose daily.  16 g  2  . Fluticasone-Salmeterol (ADVAIR) 100-50 MCG/DOSE AEPB Inhale 2 puffs into the lungs as needed.      . furosemide (LASIX) 40 MG tablet TAKE 1 TABLET (40 MG TOTAL) BY MOUTH 2 (TWO) TIMES DAILY.  180 tablet  1  . gabapentin (NEURONTIN) 300 MG capsule Take 1 capsule (300 mg total) by mouth 2 (two) times daily.  62 capsule  3  . ibuprofen (ADVIL,MOTRIN) 600 MG tablet Take 1 tablet (600 mg total) by mouth every 6 (six) hours as needed for pain.  30 tablet  0  . levothyroxine (SYNTHROID, LEVOTHROID) 137 MCG tablet TAKE 1 TABLET EVERY DAY  30 tablet  5  . metFORMIN (GLUCOPHAGE) 500 MG tablet TAKE 1 TABLET (500 MG TOTAL) BY MOUTH 2 (TWO) TIMES DAILY WITH A MEAL.  180 tablet  10  .  methotrexate (RHEUMATREX) 2.5 MG tablet TAKE 4 TABLETS ON THURSDAY  16 tablet  12  . Multiple Vitamin (MULTIVITAMIN) tablet Take 1 tablet by mouth daily.      . sodium chloride (OCEAN) 0.65 % nasal spray Place 1 spray into the nose as needed for congestion.  15 mL  12  . glipiZIDE (GLUCOTROL) 10 MG tablet Take 5 mg by mouth as needed.      . pantoprazole (PROTONIX) 40 MG tablet Take 1 tablet (40 mg total) by mouth daily.  30 tablet  6   No current facility-administered medications on file prior to visit.    ROS:  Out of a complete 14 system review of symptoms, the patient complains only of the following symptoms, and all other reviewed systems are negative.  Fatigue Swelling of the legs, left greater than right Joint pain Headache Sleepiness  Blood pressure 133/82, pulse 70, weight 200 lb (90.719 kg), last menstrual period 12/03/2011.  Physical Exam  General: The patient is alert and cooperative at the time of the examination. The patient is moderately obese.  Neuromuscular: The patient has incomplete abduction of the right arm, unassociated with pain. The patient has significant pain with internal and external rotation of the right arm with pain at the shoulder.  Skin: 2+ edema is noted in the left ankle and leg, 1+ edema is noted on the right.   Neurologic Exam  Cranial nerves: Facial symmetry is present. Speech is normal, no aphasia or dysarthria is noted. Extraocular movements are full. Visual fields are full.  Motor: The patient has good strength in all 4 extremities.  Coordination: The patient has good finger-nose-finger and heel-to-shin bilaterally.  Gait and station: The patient has a normal gait. Tandem gait is unsteady. Romberg is positive, the patient tends to go backwards. No drift is seen.  Reflexes: Deep tendon reflexes are symmetric.   Assessment/Plan:  One. Neurosarcoidosis  2. Right shoulder discomfort  3. Insomnia  4. Lower extremity edema  The  patient has noted new onset edema involving the left greater right leg. The patient will be set up for a venous doppler study. The patient will go up on the trazodone taking 150 mg at night. Blood work will be done today. The patient remains on methotrexate taking 10 mg once a week.  Marlan Palau MD 04/17/2013 7:08 PM  [facility] [address] [place], [place] [address]  Phone [phone] Fax [phone]

## 2013-04-18 ENCOUNTER — Telehealth: Payer: Self-pay

## 2013-04-18 NOTE — Telephone Encounter (Signed)
Spoked to pt, gave normal lab results.

## 2013-04-18 NOTE — Telephone Encounter (Signed)
Message copied by Doree Barthel on Mon Apr 18, 2013 10:06 AM ------      Message from: Stephanie Acre      Created: Fri Apr 15, 2013  4:56 PM       Please call the patient. The blood work results are unremarkable. Thank you.            ----- Message -----         From: [facility] Lab Results In Interface         Sent: 04/15/2013   4:39 PM           To: York Spaniel, MD                   ------

## 2013-04-21 ENCOUNTER — Ambulatory Visit: Payer: Medicaid Other | Admitting: Physical Therapy

## 2013-04-27 ENCOUNTER — Ambulatory Visit: Payer: Medicaid Other | Admitting: Physical Therapy

## 2013-04-28 ENCOUNTER — Ambulatory Visit: Payer: Medicaid Other | Admitting: Physical Therapy

## 2013-05-19 ENCOUNTER — Other Ambulatory Visit: Payer: Self-pay | Admitting: *Deleted

## 2013-05-20 MED ORDER — AMLODIPINE BESYLATE 10 MG PO TABS: ORAL_TABLET | ORAL | Status: DC

## 2013-06-02 ENCOUNTER — Telehealth: Payer: Self-pay | Admitting: *Deleted

## 2013-06-02 NOTE — Telephone Encounter (Signed)
Return call to pt stated that she will need to have surgery on her shoulder.  Needs Surgical Clearance for-pt has been scheduled for an appointment with her PCP.  [facility] was contacted and message left to fax over form needed for the Surgical Clearance.  Angelina Ok, RN 06/02/2013 3:00 PM.

## 2013-06-03 ENCOUNTER — Other Ambulatory Visit: Payer: Self-pay | Admitting: Internal Medicine

## 2013-06-09 ENCOUNTER — Other Ambulatory Visit: Payer: Self-pay

## 2013-06-09 ENCOUNTER — Other Ambulatory Visit: Payer: Self-pay | Admitting: *Deleted

## 2013-06-10 MED ORDER — ESOMEPRAZOLE MAGNESIUM 20 MG PO CPDR: 20.0000 mg | DELAYED_RELEASE_CAPSULE | Freq: Every day | ORAL | Status: DC

## 2013-06-13 ENCOUNTER — Encounter: Payer: Self-pay | Admitting: Internal Medicine

## 2013-06-13 ENCOUNTER — Ambulatory Visit (INDEPENDENT_AMBULATORY_CARE_PROVIDER_SITE_OTHER): Payer: Medicaid Other | Admitting: Internal Medicine

## 2013-06-13 ENCOUNTER — Ambulatory Visit (HOSPITAL_COMMUNITY)
Admission: RE | Admit: 2013-06-13 | Discharge: 2013-06-13 | Disposition: A | Discharge status: Home / Self Care | Payer: Medicaid Other | Source: Ambulatory Visit | Attending: Internal Medicine | Admitting: Internal Medicine

## 2013-06-13 VITALS — BP 117/82 | HR 67 | Temp 97.0°F | Ht 68.0 in | Wt 200.7 lb

## 2013-06-13 DIAGNOSIS — Z01810 Encounter for preprocedural cardiovascular examination: Secondary | ICD-10-CM | POA: Insufficient documentation

## 2013-06-13 DIAGNOSIS — R9431 Abnormal electrocardiogram [ECG] [EKG]: Secondary | ICD-10-CM | POA: Insufficient documentation

## 2013-06-13 DIAGNOSIS — G4700 Insomnia, unspecified: Secondary | ICD-10-CM

## 2013-06-13 DIAGNOSIS — M25511 Pain in right shoulder: Secondary | ICD-10-CM

## 2013-06-13 DIAGNOSIS — M25519 Pain in unspecified shoulder: Secondary | ICD-10-CM

## 2013-06-13 DIAGNOSIS — Z01818 Encounter for other preprocedural examination: Secondary | ICD-10-CM

## 2013-06-13 DIAGNOSIS — E119 Type 2 diabetes mellitus without complications: Secondary | ICD-10-CM

## 2013-06-13 LAB — GLUCOSE, CAPILLARY: Glucose-Capillary: 83 mg/dL (ref 70–99)

## 2013-06-13 MED ORDER — ZOLPIDEM TARTRATE 5 MG PO TABS: 5.0000 mg | ORAL_TABLET | Freq: Every evening | ORAL | Status: DC | PRN

## 2013-06-13 NOTE — Patient Instructions (Addendum)
We will fax the surgical clearance to Orthopaedics.  If you develop chest pain, shortness of breath or swelling of the legs that does not resolve with elevation, please call the clinic. We have refilled you Ambien and discontinued the Trazodone. Follow-up in 3-4 months with this clinic.

## 2013-06-13 NOTE — Progress Notes (Signed)
  Subjective:    Patient ID: [name] [name], female    DOB: Oct 25, 1966, 47 y.o.   MRN: [id number]  HPI  Pt presents for pre-op clearance for shoulder surgery.  Hx is significant for steroid induced diabetes mellitus, Neurosarcoidosis, hypertension, hypothyroidism and prolonged QT on previous EKG. Denies chest pain, shortness of breath, palpitations,  Light-headedness or syncope.   Review of Systems  Constitutional: Negative for fever and fatigue.  Respiratory: Negative for chest tightness and shortness of breath.   Cardiovascular: Negative for chest pain, palpitations and leg swelling.  Gastrointestinal: Negative for constipation.  Genitourinary: Negative for dysuria.  Musculoskeletal:       Right shoulder pain and tenderness on ROM  Skin: Negative for rash.  Neurological: Negative for weakness.       Objective:   Physical Exam  Constitutional: She is oriented to person, place, and time. She appears well-developed and well-nourished.  HENT:  Head: Normocephalic and atraumatic.  Cardiovascular: Normal rate, regular rhythm and normal heart sounds.   Pulmonary/Chest: Effort normal and breath sounds normal.  Abdominal: Soft. Bowel sounds are normal.  Musculoskeletal: Normal range of motion.  Neurological: She is alert and oriented to person, place, and time.  Skin: Skin is warm and dry.  Psychiatric: She has a normal mood and affect.          Assessment & Plan:  1. Pre-op clearance: Intermediate Risk surgery, no h/o ischemic heart disease, no h/o CVA, well controlled diabetes which is not insulin-dependent -EKG without ischemic changes and stable mild QTC prolongation -pt denies chest pain or shortness of breath -faxed clearance form to Dr. Ranell Patrick office with recommendation to hold Metformin day of surgery   2. Insomnia: renewed prescription for Ambien 5 mg qhs and discontinued trazodone which pt reports wasn't effective

## 2013-06-27 NOTE — Progress Notes (Signed)
Case discussed with Dr. Schooler (at time of visit, soon after the resident saw the patient).  We reviewed the resident's history and exam and pertinent patient test results.  I agree with the assessment, diagnosis, and plan of care documented in the resident's note. 

## 2013-06-30 ENCOUNTER — Ambulatory Visit: Payer: Self-pay | Admitting: Neurology

## 2013-07-01 HISTORY — PX: ROTATOR CUFF REPAIR: SHX139

## 2013-07-06 ENCOUNTER — Other Ambulatory Visit: Payer: Self-pay | Admitting: Obstetrics and Gynecology

## 2013-07-06 DIAGNOSIS — Z1231 Encounter for screening mammogram for malignant neoplasm of breast: Secondary | ICD-10-CM

## 2013-07-21 ENCOUNTER — Ambulatory Visit: Payer: Medicaid Other | Attending: Orthopedic Surgery | Admitting: Physical Therapy

## 2013-07-21 DIAGNOSIS — M25619 Stiffness of unspecified shoulder, not elsewhere classified: Secondary | ICD-10-CM | POA: Insufficient documentation

## 2013-07-21 DIAGNOSIS — IMO0001 Reserved for inherently not codable concepts without codable children: Secondary | ICD-10-CM | POA: Insufficient documentation

## 2013-07-21 DIAGNOSIS — M25519 Pain in unspecified shoulder: Secondary | ICD-10-CM | POA: Insufficient documentation

## 2013-07-26 ENCOUNTER — Ambulatory Visit: Payer: Medicaid Other | Admitting: Physical Therapy

## 2013-07-28 ENCOUNTER — Encounter: Payer: Medicaid Other | Admitting: Physical Therapy

## 2013-08-02 ENCOUNTER — Ambulatory Visit: Payer: Medicaid Other | Attending: Orthopedic Surgery | Admitting: Physical Therapy

## 2013-08-02 DIAGNOSIS — IMO0001 Reserved for inherently not codable concepts without codable children: Secondary | ICD-10-CM | POA: Insufficient documentation

## 2013-08-02 DIAGNOSIS — M25619 Stiffness of unspecified shoulder, not elsewhere classified: Secondary | ICD-10-CM | POA: Insufficient documentation

## 2013-08-02 DIAGNOSIS — M25519 Pain in unspecified shoulder: Secondary | ICD-10-CM | POA: Insufficient documentation

## 2013-08-04 ENCOUNTER — Encounter: Payer: Medicaid Other | Admitting: Physical Therapy

## 2013-08-16 ENCOUNTER — Ambulatory Visit: Payer: Medicaid Other | Admitting: Physical Therapy

## 2013-08-30 ENCOUNTER — Ambulatory Visit (HOSPITAL_COMMUNITY): Payer: Medicaid Other

## 2013-09-07 ENCOUNTER — Ambulatory Visit (HOSPITAL_COMMUNITY)
Admission: RE | Admit: 2013-09-07 | Discharge: 2013-09-07 | Disposition: A | Discharge status: Home / Self Care | Payer: Medicaid Other | Source: Ambulatory Visit | Attending: Obstetrics and Gynecology | Admitting: Obstetrics and Gynecology

## 2013-09-07 DIAGNOSIS — Z1231 Encounter for screening mammogram for malignant neoplasm of breast: Secondary | ICD-10-CM | POA: Insufficient documentation

## 2013-09-19 ENCOUNTER — Other Ambulatory Visit: Payer: Self-pay | Admitting: *Deleted

## 2013-09-19 MED ORDER — LEVOTHYROXINE SODIUM 137 MCG PO TABS: ORAL_TABLET | ORAL | Status: DC

## 2013-10-06 ENCOUNTER — Encounter: Payer: Self-pay | Admitting: Neurology

## 2013-10-06 ENCOUNTER — Ambulatory Visit (INDEPENDENT_AMBULATORY_CARE_PROVIDER_SITE_OTHER): Payer: Medicaid Other | Admitting: Neurology

## 2013-10-06 VITALS — BP 110/78 | HR 66 | Ht 69.0 in | Wt 193.7 lb

## 2013-10-06 DIAGNOSIS — D8689 Sarcoidosis of other sites: Secondary | ICD-10-CM

## 2013-10-06 DIAGNOSIS — G43019 Migraine without aura, intractable, without status migrainosus: Secondary | ICD-10-CM

## 2013-10-06 DIAGNOSIS — S139XXD Sprain of joints and ligaments of unspecified parts of neck, subsequent encounter: Secondary | ICD-10-CM

## 2013-10-06 DIAGNOSIS — Z5181 Encounter for therapeutic drug level monitoring: Secondary | ICD-10-CM

## 2013-10-06 DIAGNOSIS — D869 Sarcoidosis, unspecified: Secondary | ICD-10-CM

## 2013-10-06 LAB — CBC WITH DIFFERENTIAL
Basophils Absolute: 0 10*3/uL (ref 0.0–0.2)
Eos: 2 %
HCT: 37.7 % (ref 34.0–46.6)
Hemoglobin: 12.8 g/dL (ref 11.1–15.9)
Lymphocytes Absolute: 1.9 10*3/uL (ref 0.7–3.1)
Lymphs: 35 %
MCV: 84 fL (ref 79–97)
Monocytes: 6 %
Neutrophils Absolute: 3.1 10*3/uL (ref 1.4–7.0)
RBC: 4.51 x10E6/uL (ref 3.77–5.28)
RDW: 14 % (ref 12.3–15.4)
WBC: 5.4 10*3/uL (ref 3.4–10.8)

## 2013-10-06 LAB — COMPREHENSIVE METABOLIC PANEL
ALT: 25 IU/L (ref 0–32)
Albumin: 4.8 g/dL (ref 3.5–5.5)
BUN: 17 mg/dL (ref 6–24)
CO2: 29 mmol/L (ref 18–29)
Calcium: 8.9 mg/dL (ref 8.7–10.2)
Chloride: 102 mmol/L (ref 96–108)
GFR calc Af Amer: 105 mL/min/{1.73_m2} (ref >59)
Glucose: 107 mg/dL — ABNORMAL HIGH (ref 65–99)
Potassium: 3.8 mmol/L (ref 3.5–5.2)
Total Protein: 8 g/dL (ref 6.0–8.5)

## 2013-10-06 MED ORDER — GABAPENTIN 300 MG PO CAPS: ORAL_CAPSULE | ORAL | Status: DC

## 2013-10-06 NOTE — Progress Notes (Signed)
Reason for visit: Neurosarcoidosis  [name] [name] is an 47 y.o. female  History of present illness:  [name] [name] is a 47 year old right-handed black female with a history of neurosarcoidosis. The patient has been relatively stable, but she has frequent headaches that are between the eyes, and the headaches also come up from the back of the head. The patient recently had right shoulder surgery, and she has had ongoing discomfort involving the right shoulder, lack of mobility of the right shoulder, and some pain spreading up from the shoulder into the right neck. The patient has been on gabapentin, but she ran out of the medication one month ago. The patient is no longer in therapy for her shoulder. The patient denies any falls. The patient has difficulty sleeping, and she cannot roll over on her right side to sleep. The patient has to sleep on her back. The patient returns for an evaluation.  Past Medical History  Diagnosis Date  . Sarcoidosis   . OSA (obstructive sleep apnea)   . Prolonged QT interval   . Osteoporosis   . Anemia   . GERD (gastroesophageal reflux disease)   . DM (diabetes mellitus)   . Peripheral neuropathy   . HTN (hypertension)   . Nephrolithiasis   . Chronic fatigue   . Obesity   . Headache(784.0)   . Gait disturbance     Past Surgical History  Procedure Laterality Date  . Hysteroscopy    . Dilation and curettage of uterus    . Partial hysterectomy  2013  . Suboccipital craniotomy    . Thyroidectomy      Precancerous lesion  . Rotator cuff repair Right     07/2013    Family History  Problem Relation Age of Onset  . Heart disease Mother     questionable CAD and arrythmias   . Cancer Mother     Breast cancer  . Venous thrombosis Sister   . Diabetes Sister   . Heart disease Sister   . Diabetes Brother   . Heart disease Brother   . Cancer Maternal Aunt   . Venous thrombosis Sister   . Diabetes Sister   . Heart disease Sister     Social  history:  reports that she has never smoked. She has never used smokeless tobacco. She reports that she does not drink alcohol or use illicit drugs.   No Known Allergies  Medications:  Current Outpatient Prescriptions on File Prior to Visit  Medication Sig Dispense Refill  . amLODipine (NORVASC) 10 MG tablet TAKE 1 TABLET (10 MG TOTAL) BY MOUTH DAILY.  30 tablet  11  . aspirin 81 MG tablet Take 81 mg by mouth daily.        Marland Kitchen atorvastatin (LIPITOR) 20 MG tablet TAKE 1 TABLET BY MOUTH EVERY DAY  31 tablet  10  . chlorpheniramine (CHLOR-TRIMETON) 4 MG tablet Take 1 tablet (4 mg total) by mouth 2 (two) times daily as needed for allergies.  14 tablet  0  . diclofenac sodium (VOLTAREN) 1 % GEL Apply 4 g topically 4 (four) times daily.  1 Tube  3  . fluticasone (FLONASE) 50 MCG/ACT nasal spray Place 2 sprays into the nose daily.  16 g  2  . Fluticasone-Salmeterol (ADVAIR) 100-50 MCG/DOSE AEPB Inhale 2 puffs into the lungs as needed.      . furosemide (LASIX) 40 MG tablet TAKE 1 TABLET (40 MG TOTAL) BY MOUTH 2 (TWO) TIMES DAILY.  180 tablet  1  .  levothyroxine (SYNTHROID, LEVOTHROID) 137 MCG tablet TAKE 1 TABLET EVERY DAY  90 tablet  3  . metFORMIN (GLUCOPHAGE) 500 MG tablet TAKE 1 TABLET (500 MG TOTAL) BY MOUTH 2 (TWO) TIMES DAILY WITH A MEAL.  180 tablet  10  . methotrexate (RHEUMATREX) 2.5 MG tablet TAKE 4 TABLETS ON THURSDAY  16 tablet  12  . Multiple Vitamin (MULTIVITAMIN) tablet Take 1 tablet by mouth daily.      . sodium chloride (OCEAN) 0.65 % nasal spray Place 1 spray into the nose as needed for congestion.  15 mL  12  . zolpidem (AMBIEN) 5 MG tablet Take 1 tablet (5 mg total) by mouth at bedtime as needed.  30 tablet  3  . esomeprazole (NEXIUM) 20 MG capsule Take 1 capsule (20 mg total) by mouth daily before breakfast.  90 capsule  4  . glipiZIDE (GLUCOTROL) 10 MG tablet Take 5 mg by mouth as needed.       No current facility-administered medications on file prior to visit.     ROS:  Out of a complete 14 system review of symptoms, the patient complains only of the following symptoms, and all other reviewed systems are negative.  Fatigue Swelling in the legs Dizziness Joint pain, muscle cramps, achy muscles Headache Sleepiness Decreased energy  Blood pressure 110/78, pulse 66, height 5\' 9"  (1.753 m), weight 193 lb 11.2 oz (87.862 kg), last menstrual period 12/03/2011.  Physical Exam  General: The patient is alert and cooperative at the time of the examination. The patient is minimally obese.  Neuromuscular: The patient has good range of movement of the cervical spine.  Skin: No significant peripheral edema is noted.   Neurologic Exam  Mental status: The patient is oriented x 3.  Cranial nerves: Facial symmetry is present. Speech is normal, no aphasia or dysarthria is noted. Extraocular movements are full. Visual fields are full.  Motor: The patient has good strength in all 4 extremities.  Sensory examination: Soft touch sensation is symmetric on the face, arms, and legs.  Coordination: The patient has good finger-nose-finger and heel-to-shin bilaterally.  Gait and station: The patient has a normal gait. Tandem gait is unsteady. Romberg is negative, but is unsteady. No drift is seen.  Reflexes: Deep tendon reflexes are symmetric.   Assessment/Plan:  1. Neurosarcoidosis  2. Cervicogenic headache  3. Right shoulder discomfort  The patient will be placed back on gabapentin, and the dose will be increased taking 300 mg in the morning, 600 mg in the evening. Blood work will be done for the methotrexate to include a comprehensive metabolic profile, and a CBC. The patient will followup in 6 months.  Marlan Palau MD 10/06/2013 7:00 PM  [facility] [address] [place], [place] [address]  Phone [phone] Fax [phone]

## 2013-10-06 NOTE — Patient Instructions (Signed)
Headache and Arthritis °Headaches and arthritis are common problems. This causes an interest in the possible role of arthritis in causing headaches. Several major forms of arthritis exist. Two of the most common types are: °· Rheumatoid arthritis. °· Osteoarthritis. °Rheumatoid arthritis may begin at any age. It is a condition in which the body attacks some of its own tissues, thinking they do not belong. This leads to destruction of the bony areas around the joints. This condition may afflict any of the body's joints. It usually produces a deformity of the joint. The hands and fingers no longer appear straight but often appear angled towards one side. In some cases, the spine may be involved. Most often it is the vertebrae of the neck (cervicalspine). The areas of the neck most commonly afflicted by rheumatoid arthritis are the first and second cervical vertebrae. Curiously, rheumatoid arthritis, though it often produces severe deformities, is not always painful.  °The more common form of arthritis is osteoarthritis. It is a wear-and-tear form of arthritis. It usually does not produce deformity of the joints or destruction of the bony tissues. Rather the ligaments weaken. They may be calcified due to the body's attempt to heal the damage. The larger joints of the body and those joints that take the most stress and strain are the most often affected. In the neck region this osteoarthritis usually involves the fifth, sixth and seventh vertebrae. This is because the effects of posture produce the most fatigue on them. Osteoarthritis is often more painful than rheumatoid arthritis.  °During workups for arthritis, a test evaluating inflammation, (the sedimentation rate) often is performed. In rheumatoid arthritis, this test will usually be elevated. Other tests for inflammation may also be elevated. In patients with osteoarthritis, x-rays of the neck or jaw joints will show changes from "lipping" of the vertebrae. This  is caused by calcium deposits in the ligaments. Or they may show narrowing of the space between the vertebrae, or spur formation (from calcium deposits). If severe, it may cause obstruction of the holes where the nerves pass from the spine to the body. In rheumatoid arthritis, dislocation of vertebrae may occur in the upper neck. CT scan and MRI in patients with osteoarthritis may show bulging of the discs that cushion the vertebrae. In the most severe cases, herniation of the discs may occur.  °Headaches, felt as a pain in the neck, may be caused by arthritis if the first, second or third vertebrae are involved. This condition is due to the nerves that supply the scalp only originating from this area of the spine. Neck pain itself, whether alone or coupled with headaches, can involve any portion of the neck. If the jaw is involved, the symptoms are similar to those of Temporomandibular Joint Syndrome (TMJ).  °The progressive severity of rheumatoid arthritis may be slowed by a variety of potent medications. In osteoarthritis, its progression is not usually hindered by medication. The following may be helpful in slowing the advancement of the disorder: °· Lifestyle adjustment. °· Exercise. °· Rest. °· Weight loss. °Medications, such as the nonsteroidal anti-inflammatory agents (NSAIDs), are useful. They may reduce the pain and improve the reduced motion which occurs in joints afflicted by arthritis. From some studies, the use of acetaminophen appears to be as effective in controlling the pain of arthritis as the NSAIDs. Physical modalities may also be useful for arthritis. They include: °· Heat. °· Massage. °· Exercise. °But physical therapy must be prescribed by a caregiver, just as most medications for   arthritis.  °Document Released: 02/07/2004 Document Revised: 02/09/2012 Document Reviewed: 07/06/2008 °[employer]® Patient Information ©2014 [employer]. ° °

## 2013-10-07 NOTE — Progress Notes (Signed)
Quick Note:  I called and gave the results of labs to pt. She verbalized understanding. ______

## 2013-10-10 ENCOUNTER — Encounter: Payer: Self-pay | Admitting: Internal Medicine

## 2013-10-10 ENCOUNTER — Ambulatory Visit (INDEPENDENT_AMBULATORY_CARE_PROVIDER_SITE_OTHER): Payer: Medicaid Other | Admitting: Internal Medicine

## 2013-10-10 VITALS — BP 136/85 | HR 77 | Temp 97.2°F | Ht 68.0 in | Wt 197.3 lb

## 2013-10-10 DIAGNOSIS — I10 Essential (primary) hypertension: Secondary | ICD-10-CM

## 2013-10-10 DIAGNOSIS — Z8739 Personal history of other diseases of the musculoskeletal system and connective tissue: Secondary | ICD-10-CM

## 2013-10-10 DIAGNOSIS — G43019 Migraine without aura, intractable, without status migrainosus: Secondary | ICD-10-CM

## 2013-10-10 DIAGNOSIS — Z0000 Encounter for general adult medical examination without abnormal findings: Secondary | ICD-10-CM

## 2013-10-10 DIAGNOSIS — K219 Gastro-esophageal reflux disease without esophagitis: Secondary | ICD-10-CM

## 2013-10-10 DIAGNOSIS — M25519 Pain in unspecified shoulder: Secondary | ICD-10-CM

## 2013-10-10 DIAGNOSIS — E785 Hyperlipidemia, unspecified: Secondary | ICD-10-CM

## 2013-10-10 DIAGNOSIS — Z23 Encounter for immunization: Secondary | ICD-10-CM

## 2013-10-10 DIAGNOSIS — E039 Hypothyroidism, unspecified: Secondary | ICD-10-CM

## 2013-10-10 DIAGNOSIS — E119 Type 2 diabetes mellitus without complications: Secondary | ICD-10-CM

## 2013-10-10 LAB — GLUCOSE, CAPILLARY: Glucose-Capillary: 93 mg/dL (ref 70–99)

## 2013-10-10 MED ORDER — ESOMEPRAZOLE MAGNESIUM 40 MG PO CPDR: 40.0000 mg | DELAYED_RELEASE_CAPSULE | Freq: Every day | ORAL | Status: DC

## 2013-10-10 MED ORDER — IBUPROFEN 800 MG PO TABS: 800.0000 mg | ORAL_TABLET | Freq: Three times a day (TID) | ORAL | Status: DC | PRN

## 2013-10-10 NOTE — Assessment & Plan Note (Signed)
Cervicogenic headaches, Neurology started gabapentin 300 mg am and 600 mg pm.  Pt did not resume gabapentin bc thought it was same dosage as before.  I discussed with pt that Dr. Anne Hahn would like her to double the evening dose to 600 mg.

## 2013-10-10 NOTE — Assessment & Plan Note (Signed)
BP Readings from Last 3 Encounters:  10/10/13 136/85  10/06/13 110/78  06/13/13 117/82    Lab Results  Component Value Date   NA 142 10/06/2013   K 3.8 10/06/2013   CREATININE 0.79 10/06/2013    Assessment: Blood pressure control: controlled Progress toward BP goal:  at goal Comments:   Plan: Medications:  continue current medications Educational resources provided:   Self management tools provided:   Other plans: cont lasix 40 mg and amlodipine 10 mg qd

## 2013-10-10 NOTE — Assessment & Plan Note (Addendum)
S/p right shoulder surgery. -ibuprofen 800 mg tid for pain management -f/u Dr. Ranell Patrick Dec 2104

## 2013-10-10 NOTE — Patient Instructions (Signed)
General Instructions: Fill the prescription for the high dose ibuprofen and take as directed. Start taking the gabapentin again.  300 mg in the morning and 600 mg in the evening. Let Dr. Anne Hahn office know if it isnt helping your pain. We will check your thyroid and cholesterol today. If we need to increase your medications, we will call you. Follow-up with me in 3-4 months.   Treatment Goals:  Goals (1 Years of Data) as of 10/10/13         As of Today 10/06/13 06/13/13 04/15/13 03/28/13     Blood Pressure    . Blood Pressure < 140/90  136/85 110/78 117/82 133/82 114/67     Result Component    . HEMOGLOBIN A1C < 7.0      6.6    . LDL CALC < 100            Progress Toward Treatment Goals:  Treatment Goal 10/10/2013  Hemoglobin A1C at goal  Blood pressure at goal    Self Care Goals & Plans:  Self Care Goal 10/10/2013  Manage my medications take my medicines as prescribed  Eat healthy foods eat foods that are low in salt; drink diet soda or water instead of juice or soda  Be physically active take a walk every day    No flowsheet data found.   Care Management & Community Referrals:  No flowsheet data found.

## 2013-10-10 NOTE — Assessment & Plan Note (Signed)
Flu and Pneumo shot today

## 2013-10-10 NOTE — Assessment & Plan Note (Addendum)
Lab Results  Component Value Date   HGBA1C 6.6 03/28/2013   HGBA1C 6.3 12/06/2012   HGBA1C 6.2 09/13/2012     Assessment: Diabetes control: good control (HgbA1C at goal) Progress toward A1C goal:  at goal Comments:   Plan: Medications:  continue current medications Home glucose monitoring: Frequency:   Timing:   Instruction/counseling given: reminded to get eye exam Educational resources provided: brochure Self management tools provided:   Other plans: cont metformin 500 mg bid

## 2013-10-10 NOTE — Progress Notes (Signed)
  Subjective:    Patient ID: [name] [name], female    DOB: 06-05-1966, 47 y.o.   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 Presents for routine f/u of diabetes and hypertension. Had right shoulder surgery in August 2014 but continues to be in a lot of discomfort and interferes with sleep. Was recently seen by Neurology for cervicogenic headaches with recommendations to up-titrate gabapentin. States that she has not been able to get the Nexium secondary to need for pre-approval.   Review of Systems  Constitutional: Negative for fever and fatigue.  Respiratory: Negative.   Cardiovascular: Negative.   Gastrointestinal: Negative.   Genitourinary: Negative.   Neurological: Positive for headaches.       Objective:   Physical Exam  Constitutional: She is oriented to person, place, and time. She appears well-developed and well-nourished. No distress.  HENT:  Head: Normocephalic and atraumatic.  Eyes: Conjunctivae and EOM are normal. Pupils are equal, round, and reactive to light.  Neck: Neck supple. No thyromegaly present.  Cardiovascular: Normal rate, regular rhythm, normal heart sounds and intact distal pulses.   No murmur heard. Pulmonary/Chest: Effort normal and breath sounds normal.  Abdominal: Soft. Bowel sounds are normal. There is no tenderness.  Musculoskeletal: She exhibits no edema.       Right shoulder: She exhibits decreased range of motion and tenderness.  Neurological: She is alert and oriented to person, place, and time.  Skin: Skin is warm and dry.  Psychiatric: She has a normal mood and affect.          Assessment & Plan:  See separate problem list charting:  #1 Diabetes Mellitus, steroid induced: controlled, cont metformin 500 mg bid -foot exam today -urine microalbumin  #2 shoulder pain: s/p right surgery -ibuprofen 800 mg  q8h -f/u Dr. Ranell Patrick in Dec 2014  #3 hypertension: at goal -cont amlodipine and lasix  #4 headaches, cervicogenic: pt to start taking gabapentin -ibuprofen should also help  #5 Preventative Care: pneumo and flu shot today  #6 GERD: pre-approval submitted some time ago, pt to check back with her pharmacy, Nexium prescription re-ordered  #7 H/o osteopenia and osteoporosis: on 2 prior DEXA scans (2002 & 2007). Most recent scan Jan 2014 not indicative of osteoporosis or osteopenia.

## 2013-10-11 LAB — LIPID PANEL
Cholesterol: 129 mg/dL (ref 0–200)
HDL: 48 mg/dL (ref >39)
Total CHOL/HDL Ratio: 2.7 Ratio

## 2013-10-11 LAB — MICROALBUMIN / CREATININE URINE RATIO
Creatinine, Urine: 9 mg/dL
Microalb Creat Ratio: 55.6 mg/g — ABNORMAL HIGH (ref 0.0–30.0)

## 2013-10-11 LAB — TSH: TSH: 0.759 u[IU]/mL (ref 0.350–4.500)

## 2013-10-12 NOTE — Progress Notes (Signed)
Case discussed with Dr. Schooler soon after the resident saw the patient.  We reviewed the resident's history and exam and pertinent patient test results.  I agree with the assessment, diagnosis and plan of care documented in the resident's note. 

## 2013-10-29 ENCOUNTER — Other Ambulatory Visit: Payer: Self-pay | Admitting: Internal Medicine

## 2013-11-29 ENCOUNTER — Other Ambulatory Visit: Payer: Self-pay | Admitting: Internal Medicine

## 2013-12-02 ENCOUNTER — Emergency Department (HOSPITAL_COMMUNITY)
Admission: EM | Admit: 2013-12-02 | Discharge: 2013-12-03 | Disposition: A | Discharge status: Home / Self Care | Payer: Medicaid Other | Attending: Emergency Medicine | Admitting: Emergency Medicine

## 2013-12-02 ENCOUNTER — Encounter (HOSPITAL_COMMUNITY): Payer: Self-pay | Admitting: Emergency Medicine

## 2013-12-02 DIAGNOSIS — E669 Obesity, unspecified: Secondary | ICD-10-CM | POA: Insufficient documentation

## 2013-12-02 DIAGNOSIS — Z79899 Other long term (current) drug therapy: Secondary | ICD-10-CM | POA: Insufficient documentation

## 2013-12-02 DIAGNOSIS — Z862 Personal history of diseases of the blood and blood-forming organs and certain disorders involving the immune mechanism: Secondary | ICD-10-CM | POA: Insufficient documentation

## 2013-12-02 DIAGNOSIS — E119 Type 2 diabetes mellitus without complications: Secondary | ICD-10-CM | POA: Insufficient documentation

## 2013-12-02 DIAGNOSIS — Z8669 Personal history of other diseases of the nervous system and sense organs: Secondary | ICD-10-CM | POA: Insufficient documentation

## 2013-12-02 DIAGNOSIS — K219 Gastro-esophageal reflux disease without esophagitis: Secondary | ICD-10-CM | POA: Insufficient documentation

## 2013-12-02 DIAGNOSIS — R071 Chest pain on breathing: Secondary | ICD-10-CM | POA: Insufficient documentation

## 2013-12-02 DIAGNOSIS — Z7982 Long term (current) use of aspirin: Secondary | ICD-10-CM | POA: Insufficient documentation

## 2013-12-02 DIAGNOSIS — I10 Essential (primary) hypertension: Secondary | ICD-10-CM | POA: Insufficient documentation

## 2013-12-02 DIAGNOSIS — Z8739 Personal history of other diseases of the musculoskeletal system and connective tissue: Secondary | ICD-10-CM | POA: Insufficient documentation

## 2013-12-02 DIAGNOSIS — R079 Chest pain, unspecified: Secondary | ICD-10-CM

## 2013-12-02 DIAGNOSIS — Z87442 Personal history of urinary calculi: Secondary | ICD-10-CM | POA: Insufficient documentation

## 2013-12-02 DIAGNOSIS — Z8619 Personal history of other infectious and parasitic diseases: Secondary | ICD-10-CM | POA: Insufficient documentation

## 2013-12-02 MED ORDER — MORPHINE SULFATE 4 MG/ML IJ SOLN
4.0000 mg | Freq: Once | INTRAMUSCULAR | Status: AC
  Administered 2013-12-03: 4 mg via INTRAVENOUS
  Filled 2013-12-02: qty 1

## 2013-12-02 MED ORDER — KETOROLAC TROMETHAMINE 30 MG/ML IJ SOLN
30.0000 mg | Freq: Once | INTRAMUSCULAR | Status: AC
  Administered 2013-12-03: 30 mg via INTRAVENOUS
  Filled 2013-12-02: qty 1

## 2013-12-02 NOTE — ED Notes (Signed)
Pt c/o right sided/middle chest pain radiating to right shoulder/right neck; no change in normal activities; denies shortness of breath; pain x 3 days; increased pain with inspiration

## 2013-12-02 NOTE — ED Provider Notes (Signed)
CSN: [id number]     Arrival date & time 12/02/13  2306 History   First MD Initiated Contact with Patient 12/02/13 2341     Chief Complaint  Patient presents with  . Chest Pain    The history is provided by the patient.   patient reports right-sided chest pain has been constant in the past 2-3 days.  She denies shortness of breath.  She states there is a pleuritic component to it.  She also reports worsening of the pain with movement and palpation of her anterior chest.  No rash.  She did have upper respiratory symptoms 2 weeks ago that seemed to be improving.  Her cough is persistent however.  She does have a history of sarcoidosis.  No history of DVT or pulmonary nose.  No history of cardiac disease.  Past Medical History  Diagnosis Date  . Sarcoidosis   . OSA (obstructive sleep apnea)   . Prolonged QT interval   . Osteoporosis   . Anemia   . GERD (gastroesophageal reflux disease)   . DM (diabetes mellitus)   . Peripheral neuropathy   . HTN (hypertension)   . Nephrolithiasis   . Chronic fatigue   . Obesity   . Headache(784.0)   . Gait disturbance    Past Surgical History  Procedure Laterality Date  . Hysteroscopy    . Dilation and curettage of uterus    . Partial hysterectomy  2013  . Suboccipital craniotomy    . Thyroidectomy      Precancerous lesion  . Rotator cuff repair Right     07/2013   Family History  Problem Relation Age of Onset  . Heart disease Mother     questionable CAD and arrythmias   . Cancer Mother     Breast cancer  . Venous thrombosis Sister   . Diabetes Sister   . Heart disease Sister   . Diabetes Brother   . Heart disease Brother   . Cancer Maternal Aunt   . Venous thrombosis Sister   . Diabetes Sister   . Heart disease Sister    History  Substance Use Topics  . Smoking status: Never Smoker   . Smokeless tobacco: Never Used  . Alcohol Use: No   OB History   Grav Para Term Preterm Abortions TAB SAB Ect Mult Living   5 4        4       Review of Systems  All other systems reviewed and are negative.    Allergies  Review of patient's allergies indicates no known allergies.  Home Medications   Current Outpatient Rx  Name  Route  Sig  Dispense  Refill  . amLODipine (NORVASC) 10 MG tablet   Oral   Take 10 mg by mouth every morning.          Marland Kitchen aspirin 81 MG tablet   Oral   Take 81 mg by mouth every morning.          Marland Kitchen atorvastatin (LIPITOR) 20 MG tablet   Oral   Take 20 mg by mouth every evening.          Marland Kitchen esomeprazole (NEXIUM) 40 MG capsule   Oral   Take 40 mg by mouth every evening.         . furosemide (LASIX) 40 MG tablet   Oral   Take 40 mg by mouth 2 (two) times daily.         Marland Kitchen levothyroxine (SYNTHROID, LEVOTHROID) 137 MCG  tablet   Oral   Take 137 mcg by mouth daily before breakfast.         . metFORMIN (GLUCOPHAGE) 500 MG tablet   Oral   Take 500 mg by mouth 2 (two) times daily with a meal.         . methotrexate (RHEUMATREX) 2.5 MG tablet   Oral   Take 10 mg by mouth once a week. On Saturdays   Caution:Chemotherapy. Protect from light.         . Multiple Vitamin (MULTIVITAMIN WITH MINERALS) TABS tablet   Oral   Take 1 tablet by mouth every evening.           BP 124/75  Pulse 93  Temp(Src) 98 F (36.7 C) (Oral)  Resp 20  SpO2 100%  LMP 12/03/2011 Physical Exam  Nursing note and vitals reviewed. Constitutional: She is oriented to person, place, and time. She appears well-developed and well-nourished. No distress.  HENT:  Head: Normocephalic and atraumatic.  Eyes: EOM are normal.  Neck: Normal range of motion.  Cardiovascular: Normal rate, regular rhythm and normal heart sounds.   Pulmonary/Chest: Effort normal and breath sounds normal.  Mild tenderness to right anterior chest wall  Abdominal: Soft. She exhibits no distension. There is no tenderness.  Musculoskeletal: Normal range of motion.  Neurological: She is alert and oriented to person, place, and  time.  Skin: Skin is warm and dry.  Psychiatric: She has a normal mood and affect. Judgment normal.    ED Course  Procedures (including critical care time) Labs Review Labs Reviewed  BASIC METABOLIC PANEL - Abnormal; Notable for the following:    Glucose, Bld 106 (*)    Creatinine, Ser 1.11 (*)    GFR calc non Af Amer 59 (*)    GFR calc Af Amer 68 (*)    All other components within normal limits  D-DIMER, QUANTITATIVE - Abnormal; Notable for the following:    D-Dimer, Quant 0.59 (*)    All other components within normal limits  CBC WITH DIFFERENTIAL  TROPONIN I   Imaging Review Dg Chest 2 View  12/03/2013   CLINICAL DATA:  Shortness of breath.  EXAM: CHEST  2 VIEW  COMPARISON:  10/05/2012  FINDINGS: The cardiac silhouette, mediastinal and hilar contours are normal and stable. The lungs are clear. No pleural effusion. The bony thorax is intact. Stable surgical changes from a distal right clavicle resection and stable sclerotic lesion in the 4th right anterior rib.  IMPRESSION: No acute cardiopulmonary findings.   Electronically Signed   By: Kalman Jewels M.D.   On: 12/03/2013 00:58  I personally reviewed the imaging tests through PACS system I reviewed available ER/hospitalization records through the EMR   EKG Interpretation    Date/Time:  Friday December 02 2013 23:18:29 EST Ventricular Rate:  89 PR Interval:  119 QRS Duration: 84 QT Interval:  478 QTC Calculation: 582 R Axis:   21 Text Interpretation:  Sinus rhythm Borderline short PR interval Prolonged QT interval No significant change since Confirmed by Jeneen Rinks  MD, [place] ([id number]) on 12/02/2013 11:27:33 PM            MDM   1. Chest pain    Low suspicion for pulmonary embolism.  D-dimer very mildly elevated but less than 1.  This is reasonable for his the low risk nature of this patient.  Her pain is reproducible.  Likely chest wall pain.    Hoy Morn, MD 12/03/13 [phone]

## 2013-12-03 ENCOUNTER — Emergency Department (HOSPITAL_COMMUNITY): Payer: Medicaid Other

## 2013-12-03 LAB — CBC WITH DIFFERENTIAL/PLATELET
Basophils Absolute: 0 10*3/uL (ref 0.0–0.1)
Basophils Relative: 1 % (ref 0–1)
EOS PCT: 3 % (ref 0–5)
Eosinophils Absolute: 0.2 10*3/uL (ref 0.0–0.7)
HCT: 37.4 % (ref 36.0–46.0)
HEMOGLOBIN: 12.3 g/dL (ref 12.0–15.0)
LYMPHS ABS: 2.8 10*3/uL (ref 0.7–4.0)
LYMPHS PCT: 45 % (ref 12–46)
MCH: 28.5 pg (ref 26.0–34.0)
MCHC: 32.9 g/dL (ref 30.0–36.0)
MCV: 86.6 fL (ref 78.0–100.0)
Monocytes Absolute: 0.5 10*3/uL (ref 0.1–1.0)
Monocytes Relative: 9 % (ref 3–12)
Neutro Abs: 2.7 10*3/uL (ref 1.7–7.7)
Neutrophils Relative %: 44 % (ref 43–77)
PLATELETS: 286 10*3/uL (ref 150–400)
RBC: 4.32 MIL/uL (ref 3.87–5.11)
RDW: 14.6 % (ref 11.5–15.5)
WBC: 6.2 10*3/uL (ref 4.0–10.5)

## 2013-12-03 LAB — BASIC METABOLIC PANEL
BUN: 21 mg/dL (ref 6–23)
CALCIUM: 9.2 mg/dL (ref 8.4–10.5)
CO2: 26 mEq/L (ref 19–32)
Chloride: 99 mEq/L (ref 96–112)
Creatinine, Ser: 1.11 mg/dL — ABNORMAL HIGH (ref 0.50–1.10)
GFR calc Af Amer: 68 mL/min — ABNORMAL LOW (ref >90)
GFR calc non Af Amer: 59 mL/min — ABNORMAL LOW (ref >90)
GLUCOSE: 106 mg/dL — AB (ref 70–99)
POTASSIUM: 4 meq/L (ref 3.7–5.3)
SODIUM: 139 meq/L (ref 137–147)

## 2013-12-03 LAB — D-DIMER, QUANTITATIVE (NOT AT ARMC): D DIMER QUANT: 0.59 ug{FEU}/mL — AB (ref 0.00–0.48)

## 2013-12-03 LAB — TROPONIN I: Troponin I: <0.3 ng/mL (ref <0.30)

## 2013-12-03 MED ORDER — HYDROCODONE-ACETAMINOPHEN 5-325 MG PO TABS: 1.0000 | ORAL_TABLET | ORAL | Status: DC | PRN

## 2013-12-03 MED ORDER — IBUPROFEN 600 MG PO TABS: 600.0000 mg | ORAL_TABLET | Freq: Three times a day (TID) | ORAL | Status: DC | PRN

## 2013-12-03 NOTE — ED Notes (Signed)
Unable to access IV or meds ,attempted by3 staff, IV Team Nurse paged.

## 2013-12-23 ENCOUNTER — Other Ambulatory Visit: Payer: Self-pay | Admitting: Internal Medicine

## 2013-12-23 ENCOUNTER — Telehealth: Payer: Self-pay | Admitting: *Deleted

## 2013-12-23 DIAGNOSIS — G479 Sleep disorder, unspecified: Secondary | ICD-10-CM

## 2013-12-23 MED ORDER — ZOLPIDEM TARTRATE 5 MG PO TABS: 5.0000 mg | ORAL_TABLET | Freq: Every evening | ORAL | Status: DC | PRN

## 2013-12-23 NOTE — Telephone Encounter (Signed)
Refill request from patient for Zolpidem 5 mg at bedtime as needed.  This was removed from the med list during ED visit on 12/02/13 by Med Tech. Will you refill?

## 2013-12-23 NOTE — Telephone Encounter (Signed)
Yes, I will refill

## 2013-12-23 NOTE — Telephone Encounter (Signed)
Rx faxed in.

## 2013-12-29 ENCOUNTER — Telehealth: Payer: Self-pay | Admitting: Neurology

## 2013-12-29 MED ORDER — HYDROCODONE-ACETAMINOPHEN 5-325 MG PO TABS: 1.0000 | ORAL_TABLET | Freq: Four times a day (QID) | ORAL | Status: DC | PRN

## 2013-12-29 NOTE — Telephone Encounter (Signed)
I called patient. The patient has been having headaches over the last 4 or 5 days. The patient gets hydrocodone every once in a while, this generally will help her headache. I'll write a prescription for this.

## 2013-12-29 NOTE — Telephone Encounter (Signed)
Patient calling to state that she has been having bad headaches since the weekend and would like something called in for the pain. Please call patient back and advise.

## 2013-12-30 ENCOUNTER — Other Ambulatory Visit: Payer: Self-pay | Admitting: Neurology

## 2013-12-30 NOTE — Telephone Encounter (Signed)
Called patient to inform her that her Rx was ready to be picked up at the front desk and if she has any other problems, questions or concerns to call the office. Patient verbalized understanding. °

## 2014-01-01 ENCOUNTER — Other Ambulatory Visit: Payer: Self-pay | Admitting: Internal Medicine

## 2014-01-11 ENCOUNTER — Ambulatory Visit (INDEPENDENT_AMBULATORY_CARE_PROVIDER_SITE_OTHER): Payer: Medicaid Other | Admitting: Internal Medicine

## 2014-01-11 ENCOUNTER — Ambulatory Visit: Payer: Medicaid Other | Admitting: Internal Medicine

## 2014-01-11 ENCOUNTER — Encounter: Payer: Self-pay | Admitting: Internal Medicine

## 2014-01-11 VITALS — BP 123/83 | HR 93 | Temp 97.3°F | Ht 68.0 in | Wt 197.7 lb

## 2014-01-11 DIAGNOSIS — G8929 Other chronic pain: Secondary | ICD-10-CM

## 2014-01-11 DIAGNOSIS — R51 Headache: Secondary | ICD-10-CM

## 2014-01-11 DIAGNOSIS — I10 Essential (primary) hypertension: Secondary | ICD-10-CM

## 2014-01-11 DIAGNOSIS — J309 Allergic rhinitis, unspecified: Secondary | ICD-10-CM

## 2014-01-11 DIAGNOSIS — G479 Sleep disorder, unspecified: Secondary | ICD-10-CM

## 2014-01-11 DIAGNOSIS — D8689 Sarcoidosis of other sites: Secondary | ICD-10-CM

## 2014-01-11 DIAGNOSIS — E119 Type 2 diabetes mellitus without complications: Secondary | ICD-10-CM

## 2014-01-11 DIAGNOSIS — Z0000 Encounter for general adult medical examination without abnormal findings: Secondary | ICD-10-CM

## 2014-01-11 LAB — BASIC METABOLIC PANEL WITH GFR
BUN: 15 mg/dL (ref 6–23)
CO2: 28 mEq/L (ref 19–32)
Calcium: 8.9 mg/dL (ref 8.4–10.5)
Chloride: 103 mEq/L (ref 96–112)
Creat: 0.79 mg/dL (ref 0.50–1.10)
Glucose, Bld: 127 mg/dL — ABNORMAL HIGH (ref 70–99)
POTASSIUM: 4.6 meq/L (ref 3.5–5.3)
Sodium: 139 mEq/L (ref 135–145)

## 2014-01-11 LAB — GLUCOSE, CAPILLARY: Glucose-Capillary: 142 mg/dL — ABNORMAL HIGH (ref 70–99)

## 2014-01-11 LAB — POCT GLYCOSYLATED HEMOGLOBIN (HGB A1C): HEMOGLOBIN A1C: 6.9

## 2014-01-11 MED ORDER — FLUTICASONE PROPIONATE 50 MCG/ACT NA SUSP: 2.0000 | Freq: Every day | NASAL | Status: DC

## 2014-01-11 MED ORDER — IBUPROFEN 600 MG PO TABS: 600.0000 mg | ORAL_TABLET | Freq: Three times a day (TID) | ORAL | Status: DC | PRN

## 2014-01-11 MED ORDER — MELATONIN 3 MG PO TABS: 1.0000 | ORAL_TABLET | Freq: Every day | ORAL | Status: DC

## 2014-01-11 NOTE — Assessment & Plan Note (Signed)
Patient complaining of chronic difficulty staying asleep resulting in fatigue.  She has been diagnosed with OSA but does not use CPAP.  She has been prescribed Ambien but no longer thinks it works.  Sent rx for melatonin today.

## 2014-01-11 NOTE — Assessment & Plan Note (Signed)
Lab Results  Component Value Date   HGBA1C 6.9 01/11/2014   HGBA1C 6.1 10/10/2013   HGBA1C 6.6 03/28/2013     Assessment: Diabetes control:  controlled  Progress toward A1C goal:   at goal   Plan: Medications:  continue current medications metformin 500 mg BID Instruction/counseling given: reminded to bring blood glucose meter & log to each visit

## 2014-01-11 NOTE — Progress Notes (Signed)
Patient ID: [name] [name], female   DOB: 28-Mar-1966, 48 y.o.   MRN: [id number]   Subjective:   Patient ID: [name] [name] female   DOB: 06-09-1966 48 y.o.   MRN: [id number]  HPI: [name] [name] is a 48 y.o. 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  Patient states that she is having daily headaches in her forehead and temples.  She was seen by neurology and PCP for same complaint in November and instructed to take ibuprofen and gabapentin.  However, she has not been taking either of these medications for some time due to gabapentin making her sleepy and "running out" of ibuprofen. No change in quality from her chronic headaches.  Denies seizures, numbness, tingling, focal weakness, slurred speech, fever.  Denies photophonophobia, aura, nausea.    She also reports difficulty staying asleep at night for months.  As a result, she feels tired in the day.  She has been diagnosed with OSA and has CPAP machine but does not use it.  She has taken Ambien in the past but does not feel that it helps.    She is requesting a refill on her Advair inhaler which she has not been prescribed in years because "now that the heater is running I need it."  She believes that her heater causes the air in her home to be different resulting in "tighter and heavier breathing."  She is also going to be traveling to urban [place] to see her husband's family and is asking if there are any vaccinations she needs to get before she leaves.   She states that she had eye exam last month.    Past Medical History  Diagnosis Date  . Sarcoidosis   . OSA (obstructive sleep apnea)   . Prolonged QT interval   . Osteoporosis   . Anemia   . GERD (gastroesophageal reflux disease)   . DM (diabetes mellitus)   . Peripheral neuropathy   . HTN (hypertension)   . Nephrolithiasis   . Chronic fatigue   .  Obesity   . Headache(784.0)   . Gait disturbance    Current Outpatient Prescriptions  Medication Sig Dispense Refill  . amLODipine (NORVASC) 10 MG tablet Take 10 mg by mouth every morning.       Marland Kitchen aspirin 81 MG tablet Take 81 mg by mouth every morning.       Marland Kitchen atorvastatin (LIPITOR) 20 MG tablet Take 20 mg by mouth every evening.       Marland Kitchen esomeprazole (NEXIUM) 40 MG capsule Take 40 mg by mouth every evening.      . furosemide (LASIX) 40 MG tablet Take 40 mg by mouth 2 (two) times daily.      Marland Kitchen levothyroxine (SYNTHROID, LEVOTHROID) 137 MCG tablet Take 137 mcg by mouth daily before breakfast.      . metFORMIN (GLUCOPHAGE) 500 MG tablet TAKE 1 TABLET (500 MG TOTAL) BY MOUTH 2 (TWO) TIMES DAILY WITH A MEAL.  180 tablet  3  . methotrexate (RHEUMATREX) 2.5 MG tablet Take 10 mg by mouth once a week. On Saturdays   Caution:Chemotherapy. Protect from light.      . Multiple Vitamin (MULTIVITAMIN WITH MINERALS) TABS tablet Take 1 tablet by mouth every evening.       . zolpidem (AMBIEN) 5 MG tablet Take 1 tablet (5 mg total) by mouth at bedtime as needed for sleep.  30 tablet  5  . fluticasone (FLONASE) 50 MCG/ACT nasal spray Place 2 sprays into both nostrils daily.  16 g  0  . HYDROcodone-acetaminophen (NORCO/VICODIN) 5-325 MG per tablet Take 1 tablet by mouth every 6 (six) hours as needed for moderate pain.  40 tablet  0  . ibuprofen (ADVIL,MOTRIN) 600 MG tablet Take 1 tablet (600 mg total) by mouth every 8 (eight) hours as needed.  15 tablet  0  . Melatonin 3 MG TABS Take 1 tablet (3 mg total) by mouth at bedtime.  30 tablet  0   No current facility-administered medications for this visit.   Family History  Problem Relation Age of Onset  . Heart disease Mother     questionable CAD and arrythmias   . Cancer Mother     Breast cancer  . Venous thrombosis Sister   . Diabetes Sister   . Heart disease Sister   . Diabetes Brother   . Heart disease Brother   . Cancer Maternal Aunt   . Venous  thrombosis Sister   . Diabetes Sister   . Heart disease Sister    History   Social History  . Marital Status: Married    Spouse Name: N/A    Number of Children: 4  . Years of Education: 12   Occupational History  .     Social History Main Topics  . Smoking status: Never Smoker   . Smokeless tobacco: Never Used  . Alcohol Use: No  . Drug Use: No  . Sexual Activity: None     Comment: hysterectomy   Other Topics Concern  . None   Social History Narrative   Lives in [place] with 2 sons and her husband;disabled; no use of tobacco products nor alcohol.   Patient given diabetes card on 12/05/2010.   Review of Systems: Review of Systems  Constitutional: Negative for fever and weight loss.  HENT: Negative for congestion.   Eyes: Negative for blurred vision.  Respiratory: Negative for cough and shortness of breath.   Cardiovascular: Negative for chest pain and leg swelling.  Gastrointestinal: Negative for nausea, vomiting, abdominal pain, diarrhea and constipation.  Genitourinary: Negative for dysuria.  Musculoskeletal: Negative for falls.  Neurological: Positive for headaches. Negative for dizziness, loss of consciousness and weakness.    Objective:  Physical Exam: Filed Vitals:   01/11/14 1401  BP: 123/83  Pulse: 93  Temp: 97.3 F (36.3 C)  TempSrc: Oral  Height: 5\' 8"  (1.727 m)  Weight: 197 lb 11.2 oz (89.676 kg)  SpO2: 97%    General: alert, cooperative, and in no apparent distress HEENT: NCAT, vision grossly intact, oropharynx clear and non-erythematous  Neck: supple, no lymphadenopathy Lungs: clear to ascultation bilaterally, normal work of respiration, no wheezes, rales, ronchi Heart: regular rate and rhythm, no murmurs, gallops, or rubs Abdomen: soft, non-tender, non-distended, normal bowel sounds Extremities: 2+ DP/PT pulses bilaterally, no cyanosis, clubbing, or edema Neurologic: alert & oriented X3, cranial nerves II-XII intact, strength grossly  intact, sensation intact to light touch  Assessment & Plan:  Patient discussed with Dr. Murlean Caller.  Please see problem-based assessment and plan.

## 2014-01-11 NOTE — Assessment & Plan Note (Signed)
Patient requesting Advair inhaler but do not see indication for this medication. Sent refill for Flonase today.

## 2014-01-11 NOTE — Assessment & Plan Note (Addendum)
Patient traveling to urban area of [place] in March, asked about vaccinations prior to travel.  Reviewed CDC website and informed patient that only routine vaccinations required as long as she is not going to rural area.  She reportedly had an ophthalmology exam in 12/2013.

## 2014-01-11 NOTE — Assessment & Plan Note (Signed)
BP Readings from Last 3 Encounters:  01/11/14 123/83  12/03/13 118/65  10/10/13 136/85    Lab Results  Component Value Date   NA 139 01/11/2014   K 4.6 01/11/2014   CREATININE 0.79 01/11/2014    Assessment: Blood pressure control:  controlled Progress toward BP goal:   at goal   Plan: Medications:  continue current medications

## 2014-01-11 NOTE — Assessment & Plan Note (Signed)
Patient complaining of her chronic headache today, no red flags.  She has not been taking ibuprofen or gabapentin as instructed by her neurologist and PCP, sent in new rx for ibuprofen 600 mg q12h prn.  Continue methotrexate.  Will make neurology aware in case they would like to schedule closer follow-up.

## 2014-01-11 NOTE — Patient Instructions (Addendum)
Follow-up with Dr. Michail Sermon in 3 months.   Continue taking all of your medicines as prescribed.  We sent in a prescription for ibuprofen 600 mg tabs, you may take this up to twice daily as needed but only take it as needed.  You should also start taking gabapentin 300 mg in the morning, 600 mg at night as recommended by your neurologist.   We are calling in a prescriptions for Flonase (for congestion) as well as melatonin (for sleep).   You do not need any further vaccines to travel in [place] unless you are planning to be in a rural area.  You should only drink bottled water while you are there.  You may call [phone] for further information if you wish.   Traveling Outside the [place]  See your doctor at least 4 - 6 weeks before your trip. This allows time for immunizations to take effect. If it is less than 4 weeks before you leave, you should still see your caregiver. You might still benefit from shots or medicines and information about how to protect yourself while traveling.  Your caregiver will ask you where you intend to travel, how long you intend to stay, and whether you may visit rural areas. This determines what vaccinations should be considered. Know your travel schedule when you visit your caregiver.  Adolescents and children should seek guidance on their vaccination status from their caregiver. So should women who are breastfeeding or pregnant, and people with altered immunity (HIV/AIDS, diabetes). CDC RECOMMENDS THE FOLLOWING VACCINES (AS NEEDED BY AGE AND BY WORLD REGION):  Routine Vaccines: Be up to date on your routine vaccinations. Get boosters, if needed. These include diphtheria, tetanus, and pertussis (DPT), measles, mumps, and rubella (MMR), influenza (flu), and varicella (chickenpox). Also, meningococcal, if you are 44 to 47 years of age, and zoster (shingles) if over age 37. Possibly pneumococcal, if you are a smoker or have long-term (chronic) lung or heart  disease.  Typhoid: If you are visiting low income or developing countries.  Yellow Fever: If traveling to an area where the disease is prevalent (endemic).  HPV: (Human Papilloma Virus), if you are 30 years old or younger and intend to be sexually active.  Rabies: If you might be exposed to wild or domestic animals. Pre-exposure rabies vaccine is urged for people doing more than short-term travel in countries where rabies is common (including [place]).  Polio: A single one-time booster is advised for travel to [place] and [place].  Hepatitis A: This is routinely given to children beginning at age 54 years. It is often advised for most foreign travel, including [place].  Hepatitis B: This is given routinely to infants, children, and adolescents.  Meningococcal: This is advised for travel to developing countries, where risk is high. For example, parts of sub-Saharan [place] ("meningitis belt"). [place] requires vaccine for all pilgrims attending the Hajj (religious travel to [place]).  Malaria: A vaccine does not yet exist. Oral medicines can prevent the usual types of malaria and drug-resistant strains. The most common medicine prescribed is LARIAM (mefloquine). It is taken once weekly before, during, and after travel. Other drugs are also used for malaria prevention, including chloroquine and doxycycline.  Japanese B Encephalitis (JE): This is a moderately toxic vaccine. Use is generally limited to travelers to [place], who will have long rural exposure to mosquitoes, in areas with high likelihood of disease spreading (such as, rice paddies). TO STAY HEALTHY, DO:  Wash hands often, with soap and water.  Drink only bottled or boiled water, or carbonated (bubbly) drinks in cans or bottles. Avoid tap water, fountain drinks, and ice cubes. If not possible, make water safer by BOTH filtering through an "absolute 1-micron or less" filter AND adding iodine tablets. Such filters are found in  camping and outdoor supply stores.  When buying carbonated drinks or bottled water, always inspect the bottle seal. Make sure it has not been previously opened. This could mean it was refilled with unclean beverages or water. If you suspect a bottle seal has been tampered with, return or discard it.  Eat only thoroughly cooked food from a reputable restaurant or food service provider, who routinely caters to foreign travelers. Only eat meat, fish, or shellfish that have been thoroughly cooked. Otherwise, they can infect you and cause gastroenteritis.  Avoid foods that have been prepared and left standing at room temperature. These often support bacterial growth that can make you ill.  If you will be visiting an area where there is risk for malaria, take your malaria prevention medicine before, during, and after travel, as directed. (See your doctor for a prescription.)  Protect yourself from mosquito bites:  Pay special attention to mosquito protection between dusk and dawn. This is when malaria-carrying mosquitos are active.  Wear long-sleeved shirts, long pants, and hats.  Use insect repellants that contain DEET (diethylmethyltoluamide).  Read and follow the directions and precautions on the product label.  Apply insect repellent to all exposed skin.  Do not put repellent on wounds or broken skin.  Do not breathe in, swallow, or get DEET in your eyes. DEET is toxic if swallowed. If using a spray product, apply DEET to your face by spraying your hands and rubbing the product carefully over the face. Avoid your eyes and mouth.  Purchase a bed net impregnated (treated) with the insecticide permethrin or deltamethrin. Or, spray the bed net with one of these insecticides. This is not needed if you are staying in air-conditioned or well-screened housing.  DEET may be used on adults, children, and infants older than 93 months of age. Protect infants by using a carrier draped with mosquito  netting, with an elastic edge for a tight fit.  Children under 59 years old should not apply insect repellent themselves. Do not apply to young children's hands or around their eyes and mouth.  If you are visiting areas where malaria occurs, read the malaria prevention recommendations on the CDC malaria website ([employer]). Your caregiver will guide you on the selection and use of an anti-malaria preventive medicine that you may need to take before, during, and after your visit.  To prevent fungal and parasitic infections, keep feet clean and dry. Do not go barefoot.  Always use condoms to reduce the risk of HIV and other sexually transmitted diseases.  Try to travel in vehicles that have seat belts, whenever possible. If renting a vehicle, try to rent a larger one for added protection. Wear helmets whenever bicycling or motorcycling. Avoid alcohol when operating any vehicle, even a bicycle. Avoid overcrowded, over-weighted, or top heavy buses or mini-vans. Be aware that pedestrian patterns vary greatly by country. TO AVOID GETTING SICK:  Do not eat food purchased from street vendors.  Eat at restaurants that often cater to foreign travelers (leading hotels, hotel chains).  Do not drink beverages with ice.  Do not eat dairy products, unless you know they have been pasteurized.  Do not share needles with anyone.  Do not handle animals (especially monkeys,  dogs, and cats). Avoid bites and serious diseases (including rabies and plague).  Do not swim in fresh water. Salt water is often safer. Avoid swimming pools that are not chlorinated.  Do not have unprotected sex. WHAT YOU NEED TO BRING WITH YOU:  Long-sleeved shirt, long pants, and a hat to wear outside. This is to prevent illnesses carried by insects (malaria, dengue, filariasis, leishmaniasis, onchocerciasis).  Contact information card, for use in urgent situations. This should list the names, addresses, and telephone  numbers of family member(s) or contact(s) in your country, your primary caregivers, important specialty home caregivers, area hospitals and clinics where you will travel, and your national consulate or embassy.  Purchase a pre-packaged travel health kit from a reputable source, or create one yourself. This should include a first aid kit and commonly needed medicines. ITEMS TO INCLUDE IN A TRAVEL HEALTH KIT:  Insect repellent containing DEET.  Bed nets impregnated with permethrin. (Can be purchased in camping or [employer] supply stores. Overseas, permethrin or another insecticide, deltamethrin, may be purchased to treat bed nets and clothes.)  Flying-insect spray or mosquito coils, to help clear rooms of mosquitoes. The product should contain a pyrethroid insecticide. These insecticides quickly kill flying insects, including mosquitoes.  Over-the-counter anti-diarrhea medicine, to take if you have diarrhea.  Iodine tablets and water filters to purify water, if bottled water is not available.  Sunblock and sunglasses.  Antibacterial hand wipes or an alcohol-based hand sanitizer. Must contain at least 60% alcohol.  Extra pair of contacts or prescription glasses, or both, for people who wear corrective lenses.  Prescription medicines. Make sure you have enough to last during your trip, as well as a copy of the prescription(s).  Destination-related medicines, if applicable:  Anti-malaria medicines.  Medicine to prevent or treat high-altitude illness.  Pain or fever medicines (acetaminophen, aspirin, ibuprofen).  Stomach upset or diarrhea medicines:  Over-the-counter anti-diarrhea medicine (loperamide, bismuth subsalicylate).  Antibiotic for self-treatment of moderate to severe diarrhea.  Oral rehydration solution packets.  Mild laxative.  Antacid.  Items to treat throat and respiratory symptoms:  Antihistamine.  Decongestant, alone or combined with antihistamine.  Cough  suppressant or expectorant (promotes the expulsion of mucus).  Throat lozenges.  Anti-motion sickness medicine.  Epinephrine auto-injector (such as an EpiPen), if you have a history of severe allergic reaction. Smaller dose packages are available for children.  Any medicines, prescription or over-the-counter, taken on a regular basis at home. For Basic First Aid  Disposable gloves (at least two pairs).  Adhesive bandages, multiple sizes.  Gauze.  Adhesive tape.  Elastic bandage wrap for sprains and strains.  Antiseptic.  Cotton swabs.  Tweezers.  Scissors.  Antifungal and antibacterial ointments or creams.  1% hydrocortisone cream.  Anti-itch gel or cream, for insect bites and stings.  Aloe gel for sunburns.  Moleskin or molefoam for blisters.  Digital thermometer.  Saline eye drops.  First-aid quick reference card.  Commercial suture and syringe kits, to be used by a local caregiver. (These items will also require a letter from the prescribing physician, on official letterhead stationery.) Note that some of the above items are considered sharp. They will need to be packed in your checked luggage, not in a carry on.  AFTER YOU RETURN HOME: If you have visited a malaria risk area, continue taking your anti-malaria drug for 4 weeks (chloroquine, doxycycline, or mefloquine) or 7 days (atovaquone/proguanil) after leaving the risk area. Malaria is always a serious disease and may be a deadly  illness. If you become ill with a fever or flu-like illness, while traveling or after you return home (for up to 1 year), you should seek immediate medical attention. Tell the caregiver your travel history. This information is courtesy of the Center for Disease Control (CDC).  Document Released: 10/29/2004 Document Revised: 02/09/2012 Document Reviewed: 09/17/2009 [facility] Patient Information 2014 [place].

## 2014-01-12 NOTE — Progress Notes (Signed)
INTERNAL MEDICINE TEACHING ATTENDING ADDENDUM - Dominic Pea, DO: I reviewed with the resident Dr. Stann Mainland, at the time of visit,  the medical history, physical examination, diagnosis and results of tests and treatment and I agree with the patient's care as documented.

## 2014-02-06 ENCOUNTER — Telehealth: Payer: Self-pay | Admitting: *Deleted

## 2014-02-06 NOTE — Telephone Encounter (Signed)
Received request from pt's pharmacy for her nexium 40mg  caps once daily.  Pt has tried and failed omeprazole and protonix in the past.  Request submitted online via [place] [employer].  Request approved 02/06/14-02/06/15. Pharmacy aware.Despina Hidden Cassady3/9/20158:38 AM

## 2014-03-26 ENCOUNTER — Other Ambulatory Visit: Payer: Self-pay | Admitting: Internal Medicine

## 2014-03-27 ENCOUNTER — Encounter: Payer: Self-pay | Admitting: Internal Medicine

## 2014-03-27 ENCOUNTER — Ambulatory Visit (INDEPENDENT_AMBULATORY_CARE_PROVIDER_SITE_OTHER): Payer: Medicaid Other | Admitting: Internal Medicine

## 2014-03-27 VITALS — BP 133/79 | HR 88 | Temp 97.6°F | Ht 68.0 in | Wt 198.4 lb

## 2014-03-27 DIAGNOSIS — R109 Unspecified abdominal pain: Secondary | ICD-10-CM

## 2014-03-27 DIAGNOSIS — G8929 Other chronic pain: Secondary | ICD-10-CM

## 2014-03-27 DIAGNOSIS — E119 Type 2 diabetes mellitus without complications: Secondary | ICD-10-CM

## 2014-03-27 DIAGNOSIS — R110 Nausea: Secondary | ICD-10-CM

## 2014-03-27 LAB — COMPREHENSIVE METABOLIC PANEL
ALBUMIN: 4.1 g/dL (ref 3.5–5.2)
ALT: 21 U/L (ref 0–35)
AST: 18 U/L (ref 0–37)
Alkaline Phosphatase: 56 U/L (ref 39–117)
BUN: 15 mg/dL (ref 6–23)
CALCIUM: 9 mg/dL (ref 8.4–10.5)
CHLORIDE: 102 meq/L (ref 96–112)
CO2: 29 mEq/L (ref 19–32)
Creat: 0.77 mg/dL (ref 0.50–1.10)
Glucose, Bld: 92 mg/dL (ref 70–99)
POTASSIUM: 4 meq/L (ref 3.5–5.3)
Sodium: 139 mEq/L (ref 135–145)
Total Bilirubin: 0.4 mg/dL (ref 0.2–1.2)
Total Protein: 7 g/dL (ref 6.0–8.3)

## 2014-03-27 LAB — CBC
HCT: 36.6 % (ref 36.0–46.0)
Hemoglobin: 12.4 g/dL (ref 12.0–15.0)
MCH: 28.2 pg (ref 26.0–34.0)
MCHC: 33.9 g/dL (ref 30.0–36.0)
MCV: 83.2 fL (ref 78.0–100.0)
PLATELETS: 363 10*3/uL (ref 150–400)
RBC: 4.4 MIL/uL (ref 3.87–5.11)
RDW: 14.8 % (ref 11.5–15.5)
WBC: 5.3 10*3/uL (ref 4.0–10.5)

## 2014-03-27 LAB — GLUCOSE, CAPILLARY: Glucose-Capillary: 99 mg/dL (ref 70–99)

## 2014-03-27 LAB — GAMMA GT: GGT: 9 U/L (ref 7–51)

## 2014-03-27 MED ORDER — PROMETHAZINE HCL 12.5 MG PO TABS: 12.5000 mg | ORAL_TABLET | Freq: Four times a day (QID) | ORAL | Status: DC | PRN

## 2014-03-27 MED ORDER — METFORMIN HCL 500 MG PO TABS: ORAL_TABLET | ORAL | Status: DC

## 2014-03-27 NOTE — Assessment & Plan Note (Addendum)
Reportedly has been having various abdominal pains for years.  Localizes pain to right periumbilical to mid RUQ. States that it has been associated with nausea which is now "stronger".  Pain worsened when she needs to have bowel movement. On daily Nexium for GERD. No red flag for appendicitis or salpingitis given s/p TAH SBO and chronic quality of pain. No hematuria on urine dipstick. Possible presentation of IBS. -CMET -check Hep panel -refer to GI

## 2014-03-27 NOTE — Assessment & Plan Note (Signed)
Unclear etiology. Will manage with Phenergan for now and refer to GI for work up of abd pain which occurs concurrently with the nausea.

## 2014-03-27 NOTE — Assessment & Plan Note (Signed)
Lab Results  Component Value Date   HGBA1C 6.9 01/11/2014   HGBA1C 6.1 10/10/2013   HGBA1C 6.6 03/28/2013     Assessment: Diabetes control: good control (HgbA1C at goal) Progress toward A1C goal:  at goal Comments:refilled metformin  Plan: Medications:  continue current medications Home glucose monitoring: Frequency: once a day Timing:   Instruction/counseling given: reminded to bring medications to each visit Educational resources provided: brochure Self management tools provided:   Other plans: cont metformin, gastroparesis causing her abd pain and nausea is a consideration

## 2014-03-27 NOTE — Progress Notes (Signed)
   Subjective:    Patient ID: [name] [name], female    DOB: [birth year], 48 y.o.   MRN: [id number]  HPI  Follow-up for cervicogenic headaches , neurology will call for appt because brain scan needed (q49yr) for neurosarcoidosis. She also reports of daily nausea and shooting abdominal pain worsening over the past "couple of months", worsened by bowel movements but denies constipation. Hx significant for hysterectomy, recent [place] trip in March but states that pain started before the trip. On review of her chart she has had chronic abdominal pain documented since at least 2011.  Never evaluated by Gastroenterologist. She has steroid induced Diabetes mellitus controlled with metformin, hypertension, GERD on daily Nexium, and hypothyroidism. No fever, chills, blood or mucus in stools, dysuria, blood in urine.   Review of Systems  Constitutional: Negative for fever and unexpected weight change.  Eyes: Negative.   Respiratory: Negative for chest tightness and shortness of breath.   Cardiovascular: Negative for chest pain and palpitations.  Gastrointestinal: Positive for nausea and abdominal pain. Negative for constipation and blood in stool.  Genitourinary: Negative for dysuria, hematuria, vaginal pain and pelvic pain.  Neurological: Positive for headaches.  Psychiatric/Behavioral: Negative.        Objective:   Physical Exam  Constitutional: She is oriented to person, place, and time. She appears well-developed and well-nourished.  HENT:  Head: Normocephalic and atraumatic.  Eyes: Conjunctivae and EOM are normal. Pupils are equal, round, and reactive to light.  Neck: Normal range of motion. Neck supple. No thyromegaly present.  Cardiovascular: Normal rate, regular rhythm, normal heart sounds and intact distal pulses.   Pulmonary/Chest: Effort normal and breath sounds normal.  Abdominal: Soft. Bowel sounds are normal. She exhibits no distension and no mass. There is no tenderness. There is no  rebound and no guarding.  Neurological: She is alert and oriented to person, place, and time.  Skin: Skin is warm and dry.          Assessment & Plan:  See problem-list charting for full details.  1. Subacute on chronic Abd pain: worsening over past few months, right upper to mid quadrant, NO RLQ or pelvic pain,  -check cmet, hep panel -refer to GI  2. DM steroid induced: refill metformin

## 2014-03-27 NOTE — Patient Instructions (Signed)
General Instructions: For your abdominal pain, we will refer you to a gastroenterologist since you have had various episodes of abdominal pain over the years. They can also address your nausea which is likely related. I have prescribed phenergan to help with the nausea.  Please bring your medicines with you each time you come.   Medicines may be  Eye drops  Herbal   Vitamins  Pills  Seeing these help [name] take care of you.    Treatment Goals:  Goals (1 Years of Data) as of 03/27/14         As of Today 01/11/14 12/03/13 12/02/13 10/10/13     Blood Pressure    . Blood Pressure < 140/90  133/79 123/83 118/65 124/75 136/85     Result Component    . HEMOGLOBIN A1C < 7.0   6.9   6.1    . LDL CALC < 100      72      Progress Toward Treatment Goals:  Treatment Goal 03/27/2014  Hemoglobin A1C at goal  Blood pressure at goal    Self Care Goals & Plans:  Self Care Goal 03/27/2014  Manage my medications take my medicines as prescribed; bring my medications to every visit; refill my medications on time  Eat healthy foods drink diet soda or water instead of juice or soda; eat more vegetables; eat foods that are low in salt; eat baked foods instead of fried foods; eat fruit for snacks and desserts  Be physically active -    Home Blood Glucose Monitoring 03/27/2014  Check my blood sugar once a day     Care Management & Community Referrals:  No flowsheet data found.

## 2014-03-28 LAB — HEPATITIS PANEL, ACUTE
HCV Ab: NEGATIVE
HEP B C IGM: NONREACTIVE
Hep A IgM: NONREACTIVE
Hepatitis B Surface Ag: NEGATIVE

## 2014-03-28 NOTE — Progress Notes (Signed)
INTERNAL MEDICINE TEACHING ATTENDING ADDENDUM - Aldine Contes, MD: I reviewed and discussed at the time of visit with the resident Dr. Michail Sermon, the patient's medical history, physical examination, diagnosis and results of tests and treatment and I agree with the patient's care as documented.

## 2014-04-05 ENCOUNTER — Telehealth: Payer: Self-pay | Admitting: *Deleted

## 2014-04-05 ENCOUNTER — Emergency Department (HOSPITAL_COMMUNITY)
Admission: EM | Admit: 2014-04-05 | Discharge: 2014-04-05 | Disposition: A | Discharge status: Home / Self Care | Payer: Medicaid Other | Attending: Emergency Medicine | Admitting: Emergency Medicine

## 2014-04-05 ENCOUNTER — Encounter: Payer: Self-pay | Admitting: Internal Medicine

## 2014-04-05 ENCOUNTER — Emergency Department (HOSPITAL_COMMUNITY): Payer: Medicaid Other

## 2014-04-05 ENCOUNTER — Encounter (HOSPITAL_COMMUNITY): Payer: Self-pay | Admitting: Emergency Medicine

## 2014-04-05 ENCOUNTER — Ambulatory Visit (INDEPENDENT_AMBULATORY_CARE_PROVIDER_SITE_OTHER): Payer: Medicaid Other | Admitting: Internal Medicine

## 2014-04-05 VITALS — BP 122/74 | HR 72 | Temp 98.3°F | Ht 68.0 in | Wt 198.4 lb

## 2014-04-05 DIAGNOSIS — R5381 Other malaise: Secondary | ICD-10-CM | POA: Insufficient documentation

## 2014-04-05 DIAGNOSIS — M7989 Other specified soft tissue disorders: Secondary | ICD-10-CM

## 2014-04-05 DIAGNOSIS — M25473 Effusion, unspecified ankle: Secondary | ICD-10-CM | POA: Insufficient documentation

## 2014-04-05 DIAGNOSIS — I10 Essential (primary) hypertension: Secondary | ICD-10-CM | POA: Insufficient documentation

## 2014-04-05 DIAGNOSIS — Z7982 Long term (current) use of aspirin: Secondary | ICD-10-CM | POA: Insufficient documentation

## 2014-04-05 DIAGNOSIS — M79609 Pain in unspecified limb: Secondary | ICD-10-CM

## 2014-04-05 DIAGNOSIS — M25472 Effusion, left ankle: Secondary | ICD-10-CM

## 2014-04-05 DIAGNOSIS — M810 Age-related osteoporosis without current pathological fracture: Secondary | ICD-10-CM | POA: Insufficient documentation

## 2014-04-05 DIAGNOSIS — D649 Anemia, unspecified: Secondary | ICD-10-CM | POA: Insufficient documentation

## 2014-04-05 DIAGNOSIS — R269 Unspecified abnormalities of gait and mobility: Secondary | ICD-10-CM | POA: Insufficient documentation

## 2014-04-05 DIAGNOSIS — R5383 Other fatigue: Secondary | ICD-10-CM

## 2014-04-05 DIAGNOSIS — Z87442 Personal history of urinary calculi: Secondary | ICD-10-CM | POA: Insufficient documentation

## 2014-04-05 DIAGNOSIS — E119 Type 2 diabetes mellitus without complications: Secondary | ICD-10-CM | POA: Insufficient documentation

## 2014-04-05 DIAGNOSIS — M25476 Effusion, unspecified foot: Principal | ICD-10-CM | POA: Insufficient documentation

## 2014-04-05 DIAGNOSIS — K219 Gastro-esophageal reflux disease without esophagitis: Secondary | ICD-10-CM | POA: Insufficient documentation

## 2014-04-05 DIAGNOSIS — G4733 Obstructive sleep apnea (adult) (pediatric): Secondary | ICD-10-CM | POA: Insufficient documentation

## 2014-04-05 DIAGNOSIS — G609 Hereditary and idiopathic neuropathy, unspecified: Secondary | ICD-10-CM | POA: Insufficient documentation

## 2014-04-05 DIAGNOSIS — R9431 Abnormal electrocardiogram [ECG] [EKG]: Secondary | ICD-10-CM | POA: Insufficient documentation

## 2014-04-05 DIAGNOSIS — Z79899 Other long term (current) drug therapy: Secondary | ICD-10-CM | POA: Insufficient documentation

## 2014-04-05 DIAGNOSIS — D869 Sarcoidosis, unspecified: Secondary | ICD-10-CM | POA: Insufficient documentation

## 2014-04-05 DIAGNOSIS — R51 Headache: Secondary | ICD-10-CM | POA: Insufficient documentation

## 2014-04-05 DIAGNOSIS — E669 Obesity, unspecified: Secondary | ICD-10-CM | POA: Insufficient documentation

## 2014-04-05 LAB — CBC
HEMATOCRIT: 37.6 % (ref 36.0–46.0)
HEMOGLOBIN: 12.4 g/dL (ref 12.0–15.0)
MCH: 28.6 pg (ref 26.0–34.0)
MCHC: 33 g/dL (ref 30.0–36.0)
MCV: 86.8 fL (ref 78.0–100.0)
Platelets: 283 10*3/uL (ref 150–400)
RBC: 4.33 MIL/uL (ref 3.87–5.11)
RDW: 14.1 % (ref 11.5–15.5)
WBC: 4.9 10*3/uL (ref 4.0–10.5)

## 2014-04-05 LAB — BASIC METABOLIC PANEL
BUN: 16 mg/dL (ref 6–23)
CO2: 26 mEq/L (ref 19–32)
Calcium: 8.8 mg/dL (ref 8.4–10.5)
Chloride: 100 mEq/L (ref 96–112)
Creatinine, Ser: 0.74 mg/dL (ref 0.50–1.10)
GFR calc Af Amer: >90 mL/min (ref >90)
GFR calc non Af Amer: >90 mL/min (ref >90)
GLUCOSE: 96 mg/dL (ref 70–99)
Potassium: 3.8 mEq/L (ref 3.7–5.3)
Sodium: 141 mEq/L (ref 137–147)

## 2014-04-05 LAB — PROTIME-INR
INR: 0.95 (ref 0.00–1.49)
PROTHROMBIN TIME: 12.5 s (ref 11.6–15.2)

## 2014-04-05 NOTE — Progress Notes (Signed)
Left lower extremity venous duplex completed.  Left:  No evidence of DVT, superficial thrombosis, or Baker's cyst.  Right:  Negative for DVT in the common femoral vein.  

## 2014-04-05 NOTE — ED Notes (Signed)
Pt reporting swelling to left lower leg and foot x 2 days. Has family hx of DVTs. Can bear wt. Sent here from clinic to DVT r/o. Pt is a x 4. VSS

## 2014-04-05 NOTE — Progress Notes (Signed)
Subjective:    Patient ID: [name] [name], female    DOB: Aug 05, 1966, 48 y.o.   MRN: [id number]  HPI   [name] [name] is a 48 y.o. 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  Patient reports that 2 days ago she woke up with a swollen left ankle. There was NO preceding trauma, the swelling happened spontaneously. This has never happened before. It hurts when she walks on the ankle. She says the pain originates from the ankle but radiates up her calf.  Of note she reports that she has 2 sisters with a history of blood clots requiring blood thinners. She has no personal history of DVT or PE and has never had a lower extremity venous duplex study before.   Current Outpatient Prescriptions on File Prior to Visit  Medication Sig Dispense Refill  . amLODipine (NORVASC) 10 MG tablet Take 10 mg by mouth every morning.       Marland Kitchen aspirin 81 MG tablet Take 81 mg by mouth every morning.       Marland Kitchen atorvastatin (LIPITOR) 20 MG tablet Take 20 mg by mouth every evening.       Marland Kitchen esomeprazole (NEXIUM) 40 MG capsule Take 40 mg by mouth every evening.      . fluticasone (FLONASE) 50 MCG/ACT nasal spray Place 2 sprays into both nostrils daily.  16 g  0  . furosemide (LASIX) 40 MG tablet Take 40 mg by mouth 2 (two) times daily.      Marland Kitchen HYDROcodone-acetaminophen (NORCO/VICODIN) 5-325 MG per tablet Take 1 tablet by mouth every 6 (six) hours as needed for moderate pain.  40 tablet  0  . ibuprofen (ADVIL,MOTRIN) 600 MG tablet Take 1 tablet (600 mg total) by mouth every 8 (eight) hours as needed.  15 tablet  0  . levothyroxine (SYNTHROID, LEVOTHROID) 137 MCG tablet Take 137 mcg by mouth daily before breakfast.      . Melatonin 3 MG TABS Take 1 tablet (3 mg total) by mouth at bedtime.  30 tablet  0  . metFORMIN (GLUCOPHAGE) 500 MG tablet TAKE 1 TABLET (500 MG TOTAL) BY MOUTH 2 (TWO) TIMES DAILY WITH A MEAL.   180 tablet  3  . metFORMIN (GLUCOPHAGE) 500 MG tablet TAKE 1 TABLET (500 MG TOTAL) BY MOUTH 2 (TWO) TIMES DAILY WITH A MEAL.  180 tablet  3  . methotrexate (RHEUMATREX) 2.5 MG tablet Take 10 mg by mouth once a week. On Saturdays   Caution:Chemotherapy. Protect from light.      . Multiple Vitamin (MULTIVITAMIN WITH MINERALS) TABS tablet Take 1 tablet by mouth every evening.       . promethazine (PHENERGAN) 12.5 MG tablet Take 1 tablet (12.5 mg total) by mouth every 6 (six) hours as needed for nausea or vomiting.  30 tablet  0  . zolpidem (AMBIEN) 5 MG tablet Take 1 tablet (5 mg total) by mouth at bedtime as needed for sleep.  30 tablet  5    Review of Systems  Constitutional: Negative for fever and chills.  Respiratory: Negative for shortness of breath.   Cardiovascular: Negative for chest pain.  Musculoskeletal: Positive for gait problem (Left ankle when walking) and joint swelling (Left ankle, spontaneous).  Skin: Negative for color change.  Hematological: Bruises/bleeds easily (Spontaneous bruising left ankle).      Objective:   Physical Exam  Constitutional: She is oriented to person, place,  and time. She appears well-developed and well-nourished.  HENT:  Head: Normocephalic and atraumatic.  Eyes: Conjunctivae and EOM are normal. Pupils are equal, round, and reactive to light.  Cardiovascular: Normal rate and regular rhythm.  Exam reveals no gallop and no friction rub.   No murmur heard. Pulmonary/Chest: Effort normal and breath sounds normal. No respiratory distress. She has no wheezes. She has no rales. She exhibits no tenderness.  Musculoskeletal:       Left ankle: She exhibits swelling (Left ankle has a circumference of 26 cm compared to 24 cm at the right ankle) and ecchymosis (Bruising noted at medial malleolus). She exhibits normal range of motion and no deformity.  No calf swelling proximally - largest part of gastrocnemius muscle measures 35 cm bilaterally. No warmth or  erythema. Homans sign is positive.  Neurological: She is alert and oriented to person, place, and time. No cranial nerve deficit.  Skin: Skin is warm and dry.    Filed Vitals:   04/05/14 1543  BP: 122/74  Pulse: 72  Temp: 98.3 F (36.8 C)      Assessment & Plan:   Please see problem based charting.

## 2014-04-05 NOTE — Telephone Encounter (Signed)
Pt calls and states she has one calf/ankle that is swollen, painful, cant hardly walk, she states "my 2 sisters have had blood clots and im worried" appt today at 45 dr cater. Denies shortness of breath, chest pain.

## 2014-04-05 NOTE — Telephone Encounter (Signed)
I'll take care of her. Thanks!  Lesly Dukes, MD  [email or web address] Pager # [phone] After hours and weekends # [phone] Office # [phone]

## 2014-04-05 NOTE — ED Provider Notes (Signed)
CSN: [id number]     Arrival date & time 04/05/14  1620 History   First MD Initiated Contact with Patient 04/05/14 1652  This chart was scribed for non-physician practitioner Monico Blitz, PA-C working with Arbie Cookey, * by Anastasia Pall, ED scribe. This patient was seen in room TR10C/TR10C and the patient's care was started at 5:06 PM.    Chief Complaint  Patient presents with  . Leg Swelling   (Consider location/radiation/quality/duration/timing/severity/associated sxs/prior Treatment) The history is provided by the patient. No language interpreter was used.   HPI Comments: [name] [name] is a 48 y.o. female with family h/o DVTs who presents to the Emergency Department complaining of constant swelling in her left lower leg and foot, onset 2-3 days ago, with an associated tingling sensation to her bilateral calf. She reports the tingling sensation in her calf is more significant than her pain. She denies being on birth control pills. She states she is able to bear weight on her left leg. She denies having taken any pain medications PTA. She denies chest pain, SOB, cough, and any other associated symptoms. She denies allergies to medications.   PCP - Dorian Heckle, MD  Past Medical History  Diagnosis Date  . Sarcoidosis   . OSA (obstructive sleep apnea)   . Prolonged QT interval   . Osteoporosis   . Anemia   . GERD (gastroesophageal reflux disease)   . DM (diabetes mellitus)   . Peripheral neuropathy   . HTN (hypertension)   . Nephrolithiasis   . Chronic fatigue   . Obesity   . Headache(784.0)   . Gait disturbance    Past Surgical History  Procedure Laterality Date  . Hysteroscopy    . Dilation and curettage of uterus    . Partial hysterectomy  2013  . Suboccipital craniotomy    . Thyroidectomy      Precancerous lesion  . Rotator cuff repair Right     07/2013   Family History  Problem Relation Age of Onset  . Heart disease Mother     questionable CAD and  arrythmias   . Cancer Mother     Breast cancer  . Venous thrombosis Sister   . Diabetes Sister   . Heart disease Sister   . Diabetes Brother   . Heart disease Brother   . Cancer Maternal Aunt   . Venous thrombosis Sister   . Diabetes Sister   . Heart disease Sister    History  Substance Use Topics  . Smoking status: Never Smoker   . Smokeless tobacco: Never Used  . Alcohol Use: No   OB History   Grav Para Term Preterm Abortions TAB SAB Ect Mult Living   5 4        4      Review of Systems  Respiratory: Negative for cough and shortness of breath.   Cardiovascular: Positive for leg swelling (Left LE, leg and foot). Negative for chest pain.  Musculoskeletal: Negative for gait problem.  Skin: Negative for wound.   Allergies  Review of patient's allergies indicates no known allergies.  Home Medications   Prior to Admission medications   Medication Sig Start Date End Date Taking? Authorizing Provider  amLODipine (NORVASC) 10 MG tablet Take 10 mg by mouth every morning.     Historical Provider, MD  aspirin 81 MG tablet Take 81 mg by mouth every morning.     Historical Provider, MD  atorvastatin (LIPITOR) 20 MG tablet Take 20 mg by  mouth every evening.     Historical Provider, MD  esomeprazole (NEXIUM) 40 MG capsule Take 40 mg by mouth every evening.    Historical Provider, MD  fluticasone (FLONASE) 50 MCG/ACT nasal spray Place 2 sprays into both nostrils daily. 01/11/14 01/11/15  Ivin Poot, MD  furosemide (LASIX) 40 MG tablet Take 40 mg by mouth 2 (two) times daily.    Historical Provider, MD  HYDROcodone-acetaminophen (NORCO/VICODIN) 5-325 MG per tablet Take 1 tablet by mouth every 6 (six) hours as needed for moderate pain. 12/29/13   Kathrynn Ducking, MD  ibuprofen (ADVIL,MOTRIN) 600 MG tablet Take 1 tablet (600 mg total) by mouth every 8 (eight) hours as needed. 01/11/14   Ivin Poot, MD  levothyroxine (SYNTHROID, LEVOTHROID) 137 MCG tablet Take 137 mcg by mouth daily  before breakfast.    Historical Provider, MD  Melatonin 3 MG TABS Take 1 tablet (3 mg total) by mouth at bedtime. 01/11/14   Ivin Poot, MD  metFORMIN (GLUCOPHAGE) 500 MG tablet TAKE 1 TABLET (500 MG TOTAL) BY MOUTH 2 (TWO) TIMES DAILY WITH A MEAL.    Jeralene Huff, MD  methotrexate (RHEUMATREX) 2.5 MG tablet Take 10 mg by mouth once a week. On Saturdays   Caution:Chemotherapy. Protect from light.    Historical Provider, MD  Multiple Vitamin (MULTIVITAMIN WITH MINERALS) TABS tablet Take 1 tablet by mouth every evening.     Historical Provider, MD  promethazine (PHENERGAN) 12.5 MG tablet Take 1 tablet (12.5 mg total) by mouth every 6 (six) hours as needed for nausea or vomiting. 03/27/14   Jeralene Huff, MD  zolpidem (AMBIEN) 5 MG tablet Take 1 tablet (5 mg total) by mouth at bedtime as needed for sleep. 12/23/13   Jeralene Huff, MD   BP 132/73  Pulse 76  Temp(Src) 98.3 F (36.8 C) (Oral)  Resp 16  Wt 198 lb (89.812 kg)  SpO2 100%  LMP 12/03/2011  Physical Exam  Nursing note and vitals reviewed. Constitutional: She is oriented to person, place, and time. She appears well-developed and well-nourished. No distress.  HENT:  Head: Normocephalic and atraumatic.  Eyes: EOM are normal.  Neck: Neck supple.  Cardiovascular: Normal rate.   Pulmonary/Chest: Effort normal. No respiratory distress.  Musculoskeletal: Normal range of motion. She exhibits edema.  Ecchymosis to medial left ankle. 2 + pitting edema. No calf asymmetry. No superficial collateral. Negative Homen's sign.   Neurological: She is alert and oriented to person, place, and time.  Skin: Skin is warm and dry.  Psychiatric: She has a normal mood and affect. Her behavior is normal.   ED Course  Procedures (including critical care time)  DIAGNOSTIC STUDIES: Oxygen Saturation is 100% on room air, normal by my interpretation.    COORDINATION OF CARE: 5:09 PM-Discussed treatment plan which includes [name]  left leg and DG left ankle with pt at bedside and pt agreed to plan.   Results for orders placed during the hospital encounter of 04/05/14  [facility]      Result Value Ref Range   Prothrombin Time 12.5  11.6 - 15.2 seconds   INR 0.95  0.00 - 1.49  CBC      Result Value Ref Range   WBC 4.9  4.0 - 10.5 K/uL   RBC 4.33  3.87 - 5.11 MIL/uL   Hemoglobin 12.4  12.0 - 15.0 g/dL   HCT 37.6  36.0 - 46.0 %   MCV 86.8  78.0 - 100.0 fL   MCH 28.6  26.0 - 34.0 pg   MCHC 33.0  30.0 - 36.0 g/dL   RDW 14.1  11.5 - 15.5 %   Platelets 283  150 - 400 K/uL  BASIC METABOLIC PANEL      Result Value Ref Range   Sodium 141  137 - 147 mEq/L   Potassium 3.8  3.7 - 5.3 mEq/L   Chloride 100  96 - 112 mEq/L   CO2 26  19 - 32 mEq/L   Glucose, Bld 96  70 - 99 mg/dL   BUN 16  6 - 23 mg/dL   Creatinine, Ser 0.74  0.50 - 1.10 mg/dL   Calcium 8.8  8.4 - 10.5 mg/dL   GFR calc non Af Amer >90  >90 mL/min   GFR calc Af Amer >90  >90 mL/min   Dg Ankle Complete Left  04/05/2014   CLINICAL DATA:  Pain, swelling, bruising of through left ankle for 2-3 days with no known injury  EXAM: LEFT ANKLE COMPLETE - 3+ VIEW  COMPARISON:  None.  FINDINGS: Left ankle demonstrates no fracture or dislocation. The ankle mortise is intact. There is generalized soft tissue swelling around the left ankle.  IMPRESSION: 1. No acute osseous injury of left ankle. 2. Generalized soft tissue swelling around the ankle.   Electronically Signed   By: Kathreen Devoid   On: 04/05/2014 18:50    EKG Interpretation None     Medications - No data to display MDM   Final diagnoses:  Swollen L ankle  Pain in limb    Filed Vitals:   04/05/14 1634 04/05/14 1918  BP: 132/73 127/68  Pulse: 76 73  Temp: 98.3 F (36.8 C) 98 F (36.7 C)  TempSrc: Oral Oral  Resp: 16 16  Weight: 198 lb (89.812 kg)   SpO2: 100% 97%    Medications - No data to display  BERTICE RISSE is a 48 y.o. female presenting with left ankle bruising, swelling and  tenderness to palpation worsening over the course of 2 days. Patient does not remember any trauma to the ankle. Has family history of DVTs. Sent from family practice to rule out DVT. X-ray with no broken bones, DVT study negative. Patient given crutches, recommended RICE  Evaluation does not show pathology that would require ongoing emergent intervention or inpatient treatment. Pt is hemodynamically stable and mentating appropriately. Discussed findings and plan with patient/guardian, who agrees with care plan. All questions answered. Return precautions discussed and outpatient follow up given.   Note: Portions of this report may have been transcribed using voice recognition software. Every effort was made to ensure accuracy; however, inadvertent computerized transcription errors may be present  I personally performed the services described in this documentation, which was scribed in my presence. The recorded information has been reviewed and is accurate.    Monico Blitz, PA-C 04/06/14 [phone]

## 2014-04-05 NOTE — Assessment & Plan Note (Addendum)
Presentation is not typical for DVT. However, given her strong family history of blood clots, spontaneous appearance of this joint swelling without traumatic injury, and history of sarcoidosis*, we recommend the patient proceed to the emergency department for duplex imaging of her left lower extremity to rule out a DVT. (It is too late in the day for [name] to order the study here in clinic.)  - Patient is amenable and will proceed to the ER immediately  *Though UpToDate does not mention an association between sarcoid and hypercoagulability, according to the literature there is an association between the disease and PE (OR, 1.76; 95% CI, 1.59-1.96; P ? .0001 for women). Citation: Ray Church, DO, et al. Increased Risk of Pulmonary Embolism Among [name] Decedents With Sarcoidosis From 1988 to 2007. Chest. 2011 Nov; 140(5): 7782-4235.

## 2014-04-05 NOTE — Patient Instructions (Signed)
Thank you for your visit. - The appearance of your swollen ankle is not typical for a DVT (blood clot), but given your family history in 2 sisters of DVT requiring blood thinners, the spontaneous appearance of the swelling, and your history of sarcoidosis, we recommend you go to the ER right away for further management. - We recommend they perform a lower extremity venous duplex study of your left leg. This is an ultrasound exam for blood clots.  - Please return to the clinic next week for ER follow up if recommended by the doctor there.

## 2014-04-05 NOTE — Telephone Encounter (Signed)
Agree. Thanks

## 2014-04-05 NOTE — Discharge Instructions (Signed)
Rest, Ice intermittently (in the first 24-48 hours), Gentle compression with an Ace wrap, and elevate (Limb above the level of the heart)   Take up to 800mg of ibuprofen (that is usually 4 over the counter pills)  3 times a day for 5 days. Take with food.  

## 2014-04-06 NOTE — Progress Notes (Signed)
I saw and evaluated the patient.  I personally confirmed the key portions of the history and exam documented by Dr. Cater and I reviewed pertinent patient test results.  The assessment, diagnosis, and plan were formulated together and I agree with the documentation in the resident's note. 

## 2014-04-07 ENCOUNTER — Ambulatory Visit (INDEPENDENT_AMBULATORY_CARE_PROVIDER_SITE_OTHER): Payer: Medicaid Other | Admitting: Neurology

## 2014-04-07 ENCOUNTER — Encounter: Payer: Self-pay | Admitting: Neurology

## 2014-04-07 ENCOUNTER — Other Ambulatory Visit: Payer: Self-pay | Admitting: Neurology

## 2014-04-07 VITALS — BP 122/75 | HR 79 | Wt 197.0 lb

## 2014-04-07 DIAGNOSIS — R269 Unspecified abnormalities of gait and mobility: Secondary | ICD-10-CM

## 2014-04-07 DIAGNOSIS — Z5181 Encounter for therapeutic drug level monitoring: Secondary | ICD-10-CM

## 2014-04-07 DIAGNOSIS — D8689 Sarcoidosis of other sites: Secondary | ICD-10-CM

## 2014-04-07 DIAGNOSIS — D869 Sarcoidosis, unspecified: Secondary | ICD-10-CM

## 2014-04-07 MED ORDER — HYDROCODONE-ACETAMINOPHEN 5-325 MG PO TABS: 1.0000 | ORAL_TABLET | Freq: Four times a day (QID) | ORAL | Status: DC | PRN

## 2014-04-07 NOTE — Progress Notes (Signed)
Reason for visit: Sarcoidosis  [name] [name] is an 48 y.o. female  History of present illness:  [name] [name] is a 48 year old right-handed black female with a history of neurosarcoidosis. The patient has frequent headaches, and she indicates that she is having headaches at least 16 days a month. She reports some photophobia and some visual disturbance to one side or the other with the headaches. She has some nausea associated with the headache. Medications in the past as been ineffective, the patient currently takes hydrocodone when the headache is severe. She continues to have some issues with gait instability, but she denies any falls. There have been no new issues with visual disturbance, numbness or weakness of extremities she returns for an evaluation.  Past Medical History  Diagnosis Date  . Sarcoidosis   . OSA (obstructive sleep apnea)   . Prolonged QT interval   . Osteoporosis   . Anemia   . GERD (gastroesophageal reflux disease)   . DM (diabetes mellitus)   . Peripheral neuropathy   . HTN (hypertension)   . Nephrolithiasis   . Chronic fatigue   . Obesity   . Headache(784.0)   . Gait disturbance     Past Surgical History  Procedure Laterality Date  . Hysteroscopy    . Dilation and curettage of uterus    . Partial hysterectomy  2013  . Suboccipital craniotomy    . Thyroidectomy      Precancerous lesion  . Rotator cuff repair Right     07/2013    Family History  Problem Relation Age of Onset  . Heart disease Mother     questionable CAD and arrythmias   . Cancer Mother     Breast cancer  . Venous thrombosis Sister   . Diabetes Sister   . Heart disease Sister   . Diabetes Brother   . Heart disease Brother   . Cancer Maternal Aunt   . Venous thrombosis Sister   . Diabetes Sister   . Heart disease Sister     Social history:  reports that she has never smoked. She has never used smokeless tobacco. She reports that she does not drink alcohol or use illicit  drugs.   No Known Allergies  Medications:  Current Outpatient Prescriptions on File Prior to Visit  Medication Sig Dispense Refill  . amLODipine (NORVASC) 10 MG tablet Take 10 mg by mouth every morning.       Marland Kitchen aspirin 81 MG tablet Take 81 mg by mouth every morning.       Marland Kitchen atorvastatin (LIPITOR) 20 MG tablet Take 20 mg by mouth every evening.       Marland Kitchen esomeprazole (NEXIUM) 40 MG capsule Take 40 mg by mouth every evening.      . fluticasone (FLONASE) 50 MCG/ACT nasal spray Place 2 sprays into both nostrils daily.  16 g  0  . furosemide (LASIX) 40 MG tablet Take 40 mg by mouth 2 (two) times daily.      Marland Kitchen levothyroxine (SYNTHROID, LEVOTHROID) 137 MCG tablet Take 137 mcg by mouth daily before breakfast.      . Melatonin 3 MG TABS Take 1 tablet (3 mg total) by mouth at bedtime.  30 tablet  0  . metFORMIN (GLUCOPHAGE) 500 MG tablet TAKE 1 TABLET (500 MG TOTAL) BY MOUTH 2 (TWO) TIMES DAILY WITH A MEAL.  180 tablet  3  . methotrexate (RHEUMATREX) 2.5 MG tablet Take 10 mg by mouth once a week. On Saturdays  Caution:Chemotherapy. Protect from light.      . Multiple Vitamin (MULTIVITAMIN WITH MINERALS) TABS tablet Take 1 tablet by mouth every evening.       . promethazine (PHENERGAN) 12.5 MG tablet Take 1 tablet (12.5 mg total) by mouth every 6 (six) hours as needed for nausea or vomiting.  30 tablet  0  . zolpidem (AMBIEN) 5 MG tablet Take 1 tablet (5 mg total) by mouth at bedtime as needed for sleep.  30 tablet  5  . ibuprofen (ADVIL,MOTRIN) 600 MG tablet Take 1 tablet (600 mg total) by mouth every 8 (eight) hours as needed.  15 tablet  0   No current facility-administered medications on file prior to visit.    ROS:  Out of a complete 14 system review of symptoms, the patient complains only of the following symptoms, and all other reviewed systems are negative.  Fatigue Leg swelling Cold intolerance Abdominal pain, nausea Insomnia, frequent waking Achy muscles Headache  Blood pressure  122/75, pulse 79, weight 197 lb (89.359 kg), last menstrual period 12/03/2011.  Physical Exam  General: The patient is alert and cooperative at the time of the examination.  Skin: 1+ edema in the legs below the knees is seen bilaterally.   Neurologic Exam  Mental status: The patient is oriented x 3.  Cranial nerves: Facial symmetry is present. Speech is normal, no aphasia or dysarthria is noted. Extraocular movements are full. Visual fields are full. Pupils are equal, round, and reactive to light. Discs are flat bilaterally.  Motor: The patient has good strength in all 4 extremities.  Sensory examination: Soft touch sensation is symmetric on the face, arms, and legs.  Coordination: The patient has good finger-nose-finger and heel-to-shin bilaterally.  Gait and station: The patient has a normal gait. Tandem gait is unsteady. Romberg is positive. No drift is seen.  Reflexes: Deep tendon reflexes are symmetric.   Assessment/Plan:  1. Neurosarcoidosis  2. Headache  3. Gait disturbance  The patient should be an excellent candidate for Botox injections as she has not done well with medical therapy previously. She is having frequent headaches, and he did have features of migraine. The patient will be given information regarding Botox, and if she decides that she wants to pursue this therapy, she is to contact our office. Blood work will be done today. Methotrexate therapy will be continued. She will followup in 6 months. We will need to check blood work again in 3 months.  Jill Alexanders MD 04/09/2014 5:22 PM  [facility] [address] [place], [place] [address]  Phone [phone] Fax [phone]

## 2014-04-07 NOTE — ED Provider Notes (Signed)
Medical screening examination/treatment/procedure(s) were performed by non-physician practitioner and as supervising physician I was immediately available for consultation/collaboration.   Houston Siren III, MD 04/07/14 [phone]

## 2014-04-07 NOTE — Patient Instructions (Signed)

## 2014-04-11 ENCOUNTER — Other Ambulatory Visit (INDEPENDENT_AMBULATORY_CARE_PROVIDER_SITE_OTHER): Payer: Medicaid Other

## 2014-04-11 DIAGNOSIS — Z0289 Encounter for other administrative examinations: Secondary | ICD-10-CM

## 2014-04-11 LAB — COMPREHENSIVE METABOLIC PANEL
ALK PHOS: 66 IU/L (ref 39–117)
ALT: 21 IU/L (ref 0–32)
AST: 19 IU/L (ref 0–40)
Albumin/Globulin Ratio: 1.5 (ref 1.1–2.5)
Albumin: 4.1 g/dL (ref 3.5–5.5)
BILIRUBIN TOTAL: 0.2 mg/dL (ref 0.0–1.2)
BUN / CREAT RATIO: 20 (ref 9–23)
BUN: 16 mg/dL (ref 6–24)
CO2: 29 mmol/L (ref 18–29)
CREATININE: 0.79 mg/dL (ref 0.57–1.00)
Calcium: 8.6 mg/dL — ABNORMAL LOW (ref 8.7–10.2)
Chloride: 99 mmol/L (ref 96–108)
GFR calc non Af Amer: 90 mL/min/{1.73_m2} (ref >59)
GFR, EST AFRICAN AMERICAN: 104 mL/min/{1.73_m2} (ref >59)
GLOBULIN, TOTAL: 2.7 g/dL (ref 1.5–4.5)
Glucose: 127 mg/dL — ABNORMAL HIGH (ref 65–99)
Potassium: 4.1 mmol/L (ref 3.5–5.2)
SODIUM: 140 mmol/L (ref 134–144)
Total Protein: 6.8 g/dL (ref 6.0–8.5)

## 2014-04-11 LAB — CBC WITH DIFFERENTIAL
Basophils Absolute: 0 10*3/uL (ref 0.0–0.2)
Basos: 0 %
EOS ABS: 0.1 10*3/uL (ref 0.0–0.4)
EOS: 1 %
HCT: 35.2 % (ref 34.0–46.6)
Hemoglobin: 12 g/dL (ref 11.1–15.9)
LYMPHS ABS: 2.2 10*3/uL (ref 0.7–3.1)
Lymphs: 38 %
MCH: 28.6 pg (ref 26.6–33.0)
MCHC: 34.1 g/dL (ref 31.5–35.7)
MCV: 84 fL (ref 79–97)
Monocytes Absolute: 0.5 10*3/uL (ref 0.1–0.9)
Monocytes: 8 %
Neutrophils Absolute: 3 10*3/uL (ref 1.4–7.0)
Neutrophils Relative %: 53 %
PLATELETS: 265 10*3/uL (ref 150–379)
RBC: 4.2 x10E6/uL (ref 3.77–5.28)
RDW: 13.6 % (ref 12.3–15.4)
WBC: 5.8 10*3/uL (ref 3.4–10.8)

## 2014-04-15 ENCOUNTER — Ambulatory Visit
Admission: RE | Admit: 2014-04-15 | Discharge: 2014-04-15 | Disposition: A | Discharge status: Home / Self Care | Payer: Medicaid Other | Source: Ambulatory Visit | Attending: Neurology | Admitting: Neurology

## 2014-04-15 DIAGNOSIS — R269 Unspecified abnormalities of gait and mobility: Secondary | ICD-10-CM

## 2014-04-15 DIAGNOSIS — D8689 Sarcoidosis of other sites: Secondary | ICD-10-CM

## 2014-04-15 MED ORDER — GADOBENATE DIMEGLUMINE 529 MG/ML IV SOLN
19.0000 mL | Freq: Once | INTRAVENOUS | Status: AC | PRN
  Administered 2014-04-15: 19 mL via INTRAVENOUS

## 2014-04-18 ENCOUNTER — Telehealth: Payer: Self-pay | Admitting: Neurology

## 2014-04-18 NOTE — Telephone Encounter (Signed)
MRI of the brain was normal, I called the patient.    MRI brain 04/17/2014:  IMPRESSION: Normal MRI brain w/wo

## 2014-05-01 ENCOUNTER — Other Ambulatory Visit: Payer: Self-pay | Admitting: Internal Medicine

## 2014-06-05 ENCOUNTER — Ambulatory Visit (INDEPENDENT_AMBULATORY_CARE_PROVIDER_SITE_OTHER): Payer: Medicaid Other | Admitting: Internal Medicine

## 2014-06-05 VITALS — BP 116/81 | HR 86 | Temp 98.2°F | Wt 194.7 lb

## 2014-06-05 DIAGNOSIS — G8929 Other chronic pain: Secondary | ICD-10-CM

## 2014-06-05 DIAGNOSIS — R109 Unspecified abdominal pain: Secondary | ICD-10-CM

## 2014-06-05 MED ORDER — KETOROLAC TROMETHAMINE 30 MG/ML IJ SOLN: 30.0000 mg | Freq: Once | INTRAMUSCULAR | Status: DC

## 2014-06-05 MED ORDER — POLYETHYLENE GLYCOL 3350 17 G PO PACK: 17.0000 g | PACK | Freq: Every day | ORAL | Status: DC | PRN

## 2014-06-05 MED ORDER — KETOROLAC TROMETHAMINE 30 MG/ML IJ SOLN
30.0000 mg | Freq: Once | INTRAMUSCULAR | Status: AC
  Administered 2014-06-05: 30 mg via INTRAMUSCULAR

## 2014-06-05 NOTE — Assessment & Plan Note (Signed)
Long-standing abdominal pain associated with bowel movements, following with GI, Dr. Benson Norway at [facility].  Tried to contact Dr. Benson Norway, but he is out the office this week.  Reports bowel movements 1-2x per week, slightly better on Relistor. Not currently taking opioids.  Recent labs including BMP, LFTs, CBC normal. Pain in RLQ not typical for appendicitis, but no recent abdominal CT scans to rule out infection, mass, ovarian problems.  Will treat pain today and image, but will have patient follow up with Dr. Benson Norway if no etiology found. -Toradol 30 mg IM given in clinic. -Miralax 17 g daily to help with constipation. -Continue Relistor for constipation. -CT abdomen/pelvis with contrast, likely tomorrow. -Follow up with Dr. Benson Norway of GI with possible referral to surgery.

## 2014-06-05 NOTE — Progress Notes (Signed)
Subjective:    Patient ID: [name] [name], female    DOB: Oct 12, 1966, 48 y.o.   MRN: [id number]  HPI Comments: [name] [name] is a 48 year old woman with a history of DM2, HLD, HTN, GERD, hypothyroidis, uterine fibroids s/p partial hysterectomy, and neurosarcoidosis s/p suboccipital craniotomy presenting with continued abdominal pain.  She has had chronic abdominal pain for years and was last seen for abdominal pain in clinic on 03/27/14.  She was referred to her GI doctor Dr. Benson Norway who she has followed with for several years and has tried various treatments with minimal relief.  She saw him on 05/10/14, and he prescribed Relistor 12 mg qod.  He noted that a referral to surgery would be the next step if Relistor did not help.  She has been using the medication as prescribed.  It helped initially but has not been helping since.  Abdominal Pain This is a recurrent problem. The current episode started more than 1 month ago (2 months ago.). The onset quality is sudden (noticed when she woke up.). The problem occurs constantly. The problem has been gradually worsening. The pain is located in the right flank and left flank (Flanks and back, feels like it is deep inside stomach.). The pain is at a severity of 7/10. The pain is moderate. The quality of the pain is aching. The abdominal pain radiates to the back. Associated symptoms include anorexia (Eating less due to nausea.), constipation (Last bowel movement yesterday, having 1-2 week.  Has been a problem in the past.), headaches, nausea and weight loss (Three pounds.). Pertinent negatives include no belching, diarrhea, fever, flatus, hematochezia, hematuria, melena or vomiting. Exacerbated by: No bowel movements makes it worse. The pain is relieved by bowel movements. She has tried oral narcotic analgesics and proton pump inhibitors (Took sisters Vicodin twice which helped.) for the symptoms. The treatment provided mild relief. Prior diagnostic workup  includes GI consult. Her past medical history is significant for abdominal surgery (Partial hysterectomy, uterus removed, ovaries intact.), GERD, pancreatitis and PUD. There is no history of colon cancer, Crohn's disease, gallstones, irritable bowel syndrome or ulcerative colitis.      Review of Systems  Constitutional: Positive for weight loss (Three pounds.). Negative for fever.  Gastrointestinal: Positive for nausea, abdominal pain, constipation (Last bowel movement yesterday, having 1-2 week.  Has been a problem in the past.) and anorexia (Eating less due to nausea.). Negative for vomiting, diarrhea, melena, hematochezia and flatus.  Genitourinary: Negative for hematuria.  Neurological: Positive for headaches.       Objective:   Physical Exam  Constitutional: She is oriented to person, place, and time. She appears well-developed and well-nourished. She appears distressed.  HENT:  Head: Normocephalic and atraumatic.  Eyes: Conjunctivae and EOM are normal. Pupils are equal, round, and reactive to light. No scleral icterus.  Neck: Normal range of motion. Neck supple.  Cardiovascular: Normal rate, regular rhythm and normal heart sounds.  Exam reveals no gallop and no friction rub.   No murmur heard. Pulmonary/Chest: Effort normal and breath sounds normal. No respiratory distress. She has no wheezes. She has no rales.  Abdominal: Soft. She exhibits distension. She exhibits no mass. There is tenderness (Bilateral flanks, greatest in RLQ.). There is no rebound and no guarding.  Decreased bowel sounds.  Musculoskeletal: Normal range of motion. She exhibits no edema and no tenderness.  Neurological: She is alert and oriented to person, place, and time. No cranial nerve deficit. Coordination normal.  Skin:  Skin is warm and dry. No rash noted. She is not diaphoretic. No erythema.  Psychiatric: She has a normal mood and affect.          Assessment & Plan:  Please see problem-based  assessment and plan.

## 2014-06-05 NOTE — Patient Instructions (Addendum)
Thank you for coming to clinic today [name] [name].  General instructions: -We gave you a shot of Toradol today to help with your abdominal pain. -It appears that your abdominal pain is due to constipation, but we would like to get a CT scan of abdomen to make sure. -We will schedule at CT for you tomorrow, and I will follow up on the results. -Continue to take your Relistor. -I sent you a prescription for Miralax to your pharmacy that may help with your constipation.  Take one packet once per day. -If your abdominal pain becomes unbearable, please go to the ER. -Schedule a follow up appointment with your GI doctor, Dr. Benson Norway.  He has been following this issue long term and has the best idea of how to proceed in the future. -Schedule a follow up appointment with [name] in two weeks.  Please try to bring all your medicines next time. This helps [name] take good care of you and stops mistakes from medicines that could hurt you.  Polyethylene Glycol powder What is this medicine? POLYETHYLENE GLYCOL 3350 (pol ee ETH i leen; GLYE col) powder is a laxative used to treat constipation. It increases the amount of water in the stool. Bowel movements become easier and more frequent. This medicine may be used for other purposes; ask your health care provider or pharmacist if you have questions. COMMON BRAND NAME(S): GaviLax, GlycoLax, MiraLax, [employer] What should I tell my health care provider before I take this medicine? They need to know if you have any of these conditions: -a history of blockage of the stomach or intestine -current abdomen distension or pain -difficulty swallowing -diverticulitis, ulcerative colitis, or other chronic bowel disease -phenylketonuria -an unusual or allergic reaction to polyethylene glycol, other medicines, dyes, or preservatives -pregnant or trying to get pregnant -breast-feeding How should I use this medicine? Take this medicine by mouth. The bottle has a measuring cap  that is marked with a line. Pour the powder into the cap up to the marked line (the dose is about 1 heaping tablespoon). Add the powder in the cap to a full glass (4 to 8 ounces or 120 to 240 ml) of water, juice, soda, coffee or tea. Mix the powder well. Drink the solution. Take exactly as directed. Do not take your medicine more often than directed. Talk to your pediatrician regarding the use of this medicine in children. Special care may be needed. Overdosage: If you think you have taken too much of this medicine contact a poison control center or emergency room at once. NOTE: This medicine is only for you. Do not share this medicine with others. What if I miss a dose? If you miss a dose, take it as soon as you can. If it is almost time for your next dose, take only that dose. Do not take double or extra doses. What may interact with this medicine? Interactions are not expected. This list may not describe all possible interactions. Give your health care provider a list of all the medicines, herbs, non-prescription drugs, or dietary supplements you use. Also tell them if you smoke, drink alcohol, or use illegal drugs. Some items may interact with your medicine. What should I watch for while using this medicine? Do not use for more than 2 weeks without advice from your doctor or health care professional. It can take 2 to 4 days to have a bowel movement and to experience improvement in constipation. See your health care professional for any changes in  bowel habits, including constipation, that are severe or last longer than three weeks. Always take this medicine with plenty of water. What side effects may I notice from receiving this medicine? Side effects that you should report to your doctor or health care professional as soon as possible: -diarrhea -difficulty breathing -itching of the skin, hives, or skin rash -severe bloating, pain, or distension of the stomach -vomiting Side effects that  usually do not require medical attention (report to your doctor or health care professional if they continue or are bothersome): -bloating or gas -lower abdominal discomfort or cramps -nausea This list may not describe all possible side effects. Call your doctor for medical advice about side effects. You may report side effects to FDA at 1-800-FDA-1088. Where should I keep my medicine? Keep out of the reach of children. Store between 15 and 30 degrees C (59 and 86 degrees F). Throw away any unused medicine after the expiration date. NOTE: This sheet is a summary. It may not cover all possible information. If you have questions about this medicine, talk to your doctor, pharmacist, or health care provider.  2015, Elsevier/Gold Standard. (2008-06-19 16:50:45) Ketorolac injection What is this medicine? KETOROLAC (kee toe ROLE ak) is a non-steroidal anti-inflammatory drug (NSAID). It is used to treat moderate to severe pain for up to 5 days. It is commonly used after surgery. This medicine should not be used for more than 5 days. This medicine may be used for other purposes; ask your health care provider or pharmacist if you have questions. COMMON BRAND NAME(S): Toradol What should I tell my health care provider before I take this medicine? They need to know if you have any of these conditions: -asthma, especially aspirin-sensitive asthma -bleeding problems -kidney disease -stomach bleed, ulcer, or other problem -taking aspirin, other NSAID, or probenecid -an unusual or allergic reaction to ketorolac, tromethamine, aspirin, other NSAIDs, other medicines, foods, dyes or preservatives -pregnant or trying to get pregnant -breast-feeding How should I use this medicine? This medicine is for injection into a muscle or into a vein. It is given by a health care professional in a hospital or clinic setting. Talk to your pediatrician regarding the use of this medicine in children. While this drug may be  prescribed for children as young as 37 years old for selected conditions, precautions do apply. Patients over 49 years old may have a stronger reaction and need a smaller dose. Overdosage: If you think you have taken too much of this medicine contact a poison control center or emergency room at once. NOTE: This medicine is only for you. Do not share this medicine with others. What if I miss a dose? This does not apply. What may interact with this medicine? Do not take this medicine with any of the following medications: -aspirin and aspirin-like medicines -cidofovir -methotrexate -NSAIDs, medicines for pain and inflammation, like ibuprofen or naproxen -pentoxifylline -probenecid This medicine may also interact with the following medications: -alcohol -alendronate -alprazolam -carbamazepine -diuretics -flavocoxid -fluoxetine -ginkgo -lithium -medicines for blood pressure like enalapril -medicines that affect platelets like pentoxifylline -medicines that treat or prevent blood clots like heparin, warfarin -muscle relaxants -pemetrexed -phenytoin -thiothixene This list may not describe all possible interactions. Give your health care provider a list of all the medicines, herbs, non-prescription drugs, or dietary supplements you use. Also tell them if you smoke, drink alcohol, or use illegal drugs. Some items may interact with your medicine. What should I watch for while using this medicine? Tell your doctor  or healthcare professional if your symptoms do not start to get better or if they get worse. This medicine does not prevent heart attack or stroke. In fact, this medicine may increase the chance of a heart attack or stroke. The chance may increase with longer use of this medicine and in people who have heart disease. If you take aspirin to prevent heart attack or stroke, talk with your doctor or health care professional. Do not take medicines such as ibuprofen and naproxen with this  medicine. Side effects such as stomach upset, nausea, or ulcers may be more likely to occur. Many medicines available without a prescription should not be taken with this medicine. This medicine can cause ulcers and bleeding in the stomach and intestines at any time during treatment. Do not smoke cigarettes or drink alcohol. These increase irritation to your stomach and can make it more susceptible to damage from this medicine. Ulcers and bleeding can happen without warning symptoms and can cause death. This medicine can cause you to bleed more easily. Try to avoid damage to your teeth and gums when you brush or floss your teeth. What side effects may I notice from receiving this medicine? Side effects that you should report to your doctor or health care professional as soon as possible: -allergic reactions like skin rash, itching or hives, swelling of the face, lips, or tongue -breathing problems -high blood pressure -nausea, vomiting -redness, blistering, peeling or loosening of the skin, including inside the mouth -severe stomach pain -signs and symptoms of bleeding such as bloody or black, tarry stools; red or dark-brown urine; spitting up blood or brown material that looks like coffee grounds; red spots on the skin; unusual bruising or bleeding from the eye, gums, or nose -signs and symptoms of a blood clot changes in vision; chest pain; severe, sudden headache; trouble speaking; sudden numbness or weakness of the face, arm, or leg -trouble passing urine or change in the amount of urine -unexplained weight gain or swelling -unusually weak or tired -yellowing of eyes or skin Side effects that usually do not require medical attention (report to your doctor or health care professional if they continue or are bothersome): -diarrhea -dizziness -headache -heartburn This list may not describe all possible side effects. Call your doctor for medical advice about side effects. You may report side  effects to FDA at 1-800-FDA-1088. Where should I keep my medicine? This drug is given in a hospital or clinic and will not be stored at home. NOTE: This sheet is a summary. It may not cover all possible information. If you have questions about this medicine, talk to your doctor, pharmacist, or health care provider.  2015, Elsevier/Gold Standard. (2013-04-05 16:34:56)

## 2014-06-06 NOTE — Progress Notes (Signed)
INTERNAL MEDICINE TEACHING ATTENDING ADDENDUM - Aldine Contes, MD: I personally saw and evaluated [name] [name] in this clinic visit in conjunction with the resident, Dr. Trudee Kuster. I have discussed patient's plan of care with medical resident during this visit. I have confirmed the physical exam findings and have read and agree with the clinic note including the plan with the following addition: - Patient with mild diffuse abd tenderness worse in the RLQ. No rebound/guarding - Pain is likely secondary to chronic constipation for which she follows up with Dr. Benson Norway - C/w relistor per GI - Will give toradol * 1 dose for pain here - Will attempt to obtain CT abd/pelvis pending insurance approval - Patient will need outpt GI follow up with possible surgery referral

## 2014-06-09 ENCOUNTER — Other Ambulatory Visit: Payer: Self-pay | Admitting: *Deleted

## 2014-06-09 MED ORDER — AMLODIPINE BESYLATE 10 MG PO TABS: 10.0000 mg | ORAL_TABLET | Freq: Every morning | ORAL | Status: DC

## 2014-06-29 ENCOUNTER — Encounter: Payer: Self-pay | Admitting: Internal Medicine

## 2014-06-29 ENCOUNTER — Ambulatory Visit (INDEPENDENT_AMBULATORY_CARE_PROVIDER_SITE_OTHER): Payer: Medicaid Other | Admitting: Internal Medicine

## 2014-06-29 VITALS — BP 107/71 | HR 75 | Temp 97.8°F | Ht 68.0 in | Wt 196.3 lb

## 2014-06-29 DIAGNOSIS — I10 Essential (primary) hypertension: Secondary | ICD-10-CM

## 2014-06-29 DIAGNOSIS — G479 Sleep disorder, unspecified: Secondary | ICD-10-CM

## 2014-06-29 DIAGNOSIS — G8929 Other chronic pain: Secondary | ICD-10-CM

## 2014-06-29 DIAGNOSIS — R110 Nausea: Secondary | ICD-10-CM

## 2014-06-29 DIAGNOSIS — R109 Unspecified abdominal pain: Secondary | ICD-10-CM

## 2014-06-29 DIAGNOSIS — E119 Type 2 diabetes mellitus without complications: Secondary | ICD-10-CM

## 2014-06-29 LAB — CBC WITH DIFFERENTIAL/PLATELET
Basophils Absolute: 0 10*3/uL (ref 0.0–0.1)
Basophils Relative: 0 % (ref 0–1)
Eosinophils Absolute: 0.1 10*3/uL (ref 0.0–0.7)
Eosinophils Relative: 2 % (ref 0–5)
HCT: 39.8 % (ref 36.0–46.0)
Hemoglobin: 13 g/dL (ref 12.0–15.0)
LYMPHS ABS: 1.7 10*3/uL (ref 0.7–4.0)
LYMPHS PCT: 35 % (ref 12–46)
MCH: 28 pg (ref 26.0–34.0)
MCHC: 32.7 g/dL (ref 30.0–36.0)
MCV: 85.6 fL (ref 78.0–100.0)
Monocytes Absolute: 0.3 10*3/uL (ref 0.1–1.0)
Monocytes Relative: 7 % (ref 3–12)
NEUTROS PCT: 56 % (ref 43–77)
Neutro Abs: 2.7 10*3/uL (ref 1.7–7.7)
Platelets: 316 10*3/uL (ref 150–400)
RBC: 4.65 MIL/uL (ref 3.87–5.11)
RDW: 14.6 % (ref 11.5–15.5)
WBC: 4.9 10*3/uL (ref 4.0–10.5)

## 2014-06-29 LAB — POCT GLYCOSYLATED HEMOGLOBIN (HGB A1C): HEMOGLOBIN A1C: 6.9

## 2014-06-29 LAB — COMPLETE METABOLIC PANEL WITH GFR
ALT: 24 U/L (ref 0–35)
AST: 20 U/L (ref 0–37)
Albumin: 4.3 g/dL (ref 3.5–5.2)
Alkaline Phosphatase: 59 U/L (ref 39–117)
BUN: 19 mg/dL (ref 6–23)
CALCIUM: 9.2 mg/dL (ref 8.4–10.5)
CHLORIDE: 103 meq/L (ref 96–112)
CO2: 31 mEq/L (ref 19–32)
Creat: 0.84 mg/dL (ref 0.50–1.10)
GFR, Est Non African American: 84 mL/min
Glucose, Bld: 124 mg/dL — ABNORMAL HIGH (ref 70–99)
Potassium: 3.9 mEq/L (ref 3.5–5.3)
Sodium: 142 mEq/L (ref 135–145)
Total Bilirubin: 0.3 mg/dL (ref 0.2–1.2)
Total Protein: 7.3 g/dL (ref 6.0–8.3)

## 2014-06-29 LAB — GLUCOSE, CAPILLARY: Glucose-Capillary: 123 mg/dL — ABNORMAL HIGH (ref 70–99)

## 2014-06-29 MED ORDER — PROMETHAZINE HCL 12.5 MG PO TABS: 12.5000 mg | ORAL_TABLET | Freq: Four times a day (QID) | ORAL | Status: DC | PRN

## 2014-06-29 MED ORDER — IBUPROFEN 200 MG PO TABS: 400.0000 mg | ORAL_TABLET | Freq: Four times a day (QID) | ORAL | Status: DC | PRN

## 2014-06-29 MED ORDER — KETOROLAC TROMETHAMINE 30 MG/ML IM SOLN: 30.0000 mg | Freq: Once | INTRAMUSCULAR | Status: DC

## 2014-06-29 MED ORDER — KETOROLAC TROMETHAMINE 30 MG/ML IJ SOLN
30.0000 mg | Freq: Once | INTRAMUSCULAR | Status: AC
  Administered 2014-06-29: 30 mg via INTRAMUSCULAR

## 2014-06-29 MED ORDER — ZOLPIDEM TARTRATE 5 MG PO TABS: 5.0000 mg | ORAL_TABLET | Freq: Every evening | ORAL | Status: DC | PRN

## 2014-06-29 NOTE — Assessment & Plan Note (Signed)
Lab Results  Component Value Date   HGBA1C 6.9 06/29/2014   HGBA1C 6.9 01/11/2014   HGBA1C 6.1 10/10/2013     Assessment: Diabetes control: good control (HgbA1C at goal) Progress toward A1C goal:  at goal  Plan: Medications:  continue current medications: metformin 500 mg BID. Instruction/counseling given: reminded to bring blood glucose meter & log to each visit and reminded to bring medications to each visit

## 2014-06-29 NOTE — Patient Instructions (Addendum)
Thank you for coming to clinic today [name] [name].  General instructions: -Please go to your scheduled appointment tomorrow to get a CT of your abdomen.  We will let you know if anything is abnormal. -It is likely that your back and stomach pain is due to your chronic constipation.  Please schedule an appointment to follow up with you GI doctor, Dr. Benson Norway, to discuss the next best step. -You received an injection of Toradol today to help with your pain.  Do not take any ibuprofen (Motrin) for two days to avoid taking too much non-steroidal anti-inflammatory drugs. -You can resume taking ibuprofen 400 mg every six hours after two days for pain. -You can take phenergan 12.5 mg every six hours if needed for nausea. -Good job on your blood sugar control. Continue to take your metformin twice a day. -Please make a follow up appointment to return to clinic in 3 months.  Please bring your medicines with you each time you come.  Medicines may be  Eye drops  Herbal   Vitamins  Pills  Seeing these help [name] take care of you.

## 2014-06-29 NOTE — Progress Notes (Addendum)
   Subjective:    Patient ID: [name] [name], female    DOB: June 27, 1966, 48 y.o.   MRN: [id number]  HPI Comments:  [name] [name] is a 48 year old woman with a history of DM2, HLD, HTN, GERD, hypothyroid, uterine fibroids s/p partial hysterectomy, and neurosarcoidosis s/p suboccipital craniotomy presenting for follow up of abdominal pain.  She was last seen in clinic on 06/05/14 for chronic abdominal pain that has occurred for several years.  She is followed by her GI doctor, Dr. Benson Norway, who has tried several different treatments with minimal relief, including Relistor 12 mg every other day currently.  She last saw him on 05/10/14, and he noted that a referral to surgery would be the next step.  At her last visit with me, she was given some Miralax to try for constipation and a Toradol shot.  An abdominal CT was ordered, but she did has not gotten it at this point.  She says that her abdominal pain improved for a couple of days after receiving Toradol, but has returned and is the same.  She has been trying Voltaren gel that she was prescribed for hip pain and it helps a little bit.  She describes the pain as worst in her back with pain also in her bilateral pelvis.  The pain is worse when she hasn't used the restroom.  Miralax along with other laxatives occasionally allow her to have a bowel movement that resolves the pain.  She last had a bowel movement two days ago, and she is only able to have bowel movements 1-2 times per week.  The pain is associated with nausea and she occasionally vomits (2-3x/month).     Review of Systems  Constitutional: Positive for fatigue. Negative for fever, chills, diaphoresis, activity change, appetite change and unexpected weight change.  HENT: Negative for congestion and rhinorrhea.   Respiratory: Negative for cough, chest tightness and shortness of breath.   Gastrointestinal: Positive for nausea, vomiting, constipation and abdominal distention. Negative for diarrhea  and blood in stool.  Endocrine: Negative for cold intolerance.  Genitourinary: Negative for dysuria and difficulty urinating.  Musculoskeletal: Positive for back pain and myalgias.  Skin: Negative for rash.  Neurological: Positive for headaches (Follows with neurologist.). Negative for dizziness, syncope, weakness, light-headedness and numbness.  Psychiatric/Behavioral: Negative for dysphoric mood and agitation.       Objective:   Physical Exam  Constitutional: She is oriented to person, place, and time. She appears well-developed and well-nourished. She appears distressed.  HENT:  Head: Normocephalic and atraumatic.  Eyes: Conjunctivae and EOM are normal. Pupils are equal, round, and reactive to light.  Neck: Normal range of motion. Neck supple.  Cardiovascular: Normal rate, regular rhythm and normal heart sounds.   Pulmonary/Chest: Effort normal and breath sounds normal.  Abdominal: Soft. She exhibits distension. She exhibits no mass. There is tenderness (Suprapubic, R>L). There is no rebound and no guarding.  Musculoskeletal: Normal range of motion. She exhibits tenderness (With back palpation, feels pain inside abdomen.). She exhibits no edema.  Neurological: She is alert and oriented to person, place, and time. No cranial nerve deficit. Coordination normal.  Skin: Skin is warm and dry. No rash noted. She is not diaphoretic.        Assessment & Plan:  Please see problem-based assessment and plan.

## 2014-06-29 NOTE — Assessment & Plan Note (Addendum)
Chronic abdominal pain worse over the last several months. She has followed long-term with her gastroenterologist Dr. Benson Norway for dysmotility, and he has tried multiple treatments that have not been working.  He thinks a referral to general surgery would be the next step.  The abdominal pain is more focused in the lower back than it has been previously.  It is most likely related to her constipation because she is having significant trouble having bowel movements.  She does have a history of kidney stones, but this pain is atypical, bilateral, and lower in her back than I would expect for kidney stones.  She also says this pain is not like her previous kidney stone pain.  Talked to radiologist and they recommended a contrast-enhanced scan if suspicion of kidney stones is secondary, since the majority of stones would show up on a contrast-enhanced scan.  She says that the Toradol injection helped last time, so we will try that again today. -Contrast-enhanced CT A/P scheduled for tomorrow. -Urinalysis, BMP, CBC with diff -Phenergan 12.5 mg q6h PRN nausea -Toradol 30 mg injection today.  Told patient to not take ibuprofen today or tomorrow due to injection. -Ibuprofen 400 mg q6h PRN for pain can resume in two days. -Follow up with Dr. Benson Norway. -Schedule appointment with PCP in 3 months.  --Addendum-- Arman Filter, MD, PhD Internal Medicine Intern Pager: [phone] 06/30/2014,4:42 PM  CT abdomen showed severe constipation with no other acute abdominal or vertebral process. Called patient to notify her of the results and told her to follow up with her GI doctor, Dr. Benson Norway, as soon as he can see her. -Encouraged patient to continue regular use of Miralax to help with bowel movements. -Follow up with Dr. Benson Norway.

## 2014-06-29 NOTE — Assessment & Plan Note (Signed)
BP Readings from Last 3 Encounters:  06/29/14 107/71  06/05/14 116/81  04/07/14 122/75    Lab Results  Component Value Date   NA 140 04/11/2014   K 4.1 04/11/2014   CREATININE 0.79 04/11/2014    Assessment: Blood pressure control: controlled Progress toward BP goal:  at goal  Plan: Medications:  continue current medications: amlodipine 10 mg daily.

## 2014-06-30 ENCOUNTER — Ambulatory Visit (HOSPITAL_COMMUNITY)
Admission: RE | Admit: 2014-06-30 | Discharge: 2014-06-30 | Disposition: A | Discharge status: Home / Self Care | Payer: Medicaid Other | Source: Ambulatory Visit | Attending: Internal Medicine | Admitting: Internal Medicine

## 2014-06-30 DIAGNOSIS — R109 Unspecified abdominal pain: Secondary | ICD-10-CM

## 2014-06-30 LAB — URINALYSIS, COMPLETE
Bacteria, UA: NONE SEEN
Bilirubin Urine: NEGATIVE
CRYSTALS: NONE SEEN
Casts: NONE SEEN
Glucose, UA: NEGATIVE mg/dL
Hgb urine dipstick: NEGATIVE
Ketones, ur: NEGATIVE mg/dL
LEUKOCYTES UA: NEGATIVE
NITRITE: NEGATIVE
PH: 5.5 (ref 5.0–8.0)
Protein, ur: NEGATIVE mg/dL
SPECIFIC GRAVITY, URINE: 1.024 (ref 1.005–1.030)
Urobilinogen, UA: 0.2 mg/dL (ref 0.0–1.0)

## 2014-06-30 MED ORDER — IOHEXOL 300 MG/ML  SOLN
80.0000 mL | Freq: Once | INTRAMUSCULAR | Status: AC | PRN
  Administered 2014-06-30: 80 mL via INTRAVENOUS

## 2014-07-03 ENCOUNTER — Telehealth: Payer: Self-pay | Admitting: *Deleted

## 2014-07-03 NOTE — Telephone Encounter (Signed)
Abd CT showed severe constipation, appt made for Wed Aug 5th @1 :45pm to see GI (DrHung). Pt states she has had a "small" bowel movement, but will follow up with Dr. Benson Norway in 2 days.[name] Hidden Cassady8/3/20154:58 PM

## 2014-07-03 NOTE — Progress Notes (Signed)
I saw and evaluated the patient at the time of the visit.  I personally confirmed the key portions of the history and exam documented by Dr. Trudee Kuster and I reviewed pertinent patient test results.  The assessment, diagnosis, and plan were formulated together and I agree with the documentation in the resident's note.

## 2014-08-11 ENCOUNTER — Ambulatory Visit (INDEPENDENT_AMBULATORY_CARE_PROVIDER_SITE_OTHER): Payer: Medicaid Other | Admitting: General Surgery

## 2014-09-14 ENCOUNTER — Telehealth: Payer: Self-pay | Admitting: *Deleted

## 2014-09-14 ENCOUNTER — Other Ambulatory Visit (HOSPITAL_COMMUNITY): Payer: Self-pay | Admitting: Internal Medicine

## 2014-09-14 DIAGNOSIS — Z1231 Encounter for screening mammogram for malignant neoplasm of breast: Secondary | ICD-10-CM

## 2014-09-14 NOTE — Telephone Encounter (Signed)
Received refill request for melatonin 3mg  tabs-pt informed medication is available OTC and is not covered by medicaid. Pt agreed to buy med OTC, phone call complete.Despina Hidden Cassady10/15/20152:29 PM

## 2014-09-22 ENCOUNTER — Ambulatory Visit (HOSPITAL_COMMUNITY)
Admission: RE | Admit: 2014-09-22 | Discharge: 2014-09-22 | Disposition: A | Discharge status: Home / Self Care | Payer: Medicaid Other | Source: Ambulatory Visit | Attending: Internal Medicine | Admitting: Internal Medicine

## 2014-09-22 DIAGNOSIS — Z1231 Encounter for screening mammogram for malignant neoplasm of breast: Secondary | ICD-10-CM | POA: Insufficient documentation

## 2014-10-02 ENCOUNTER — Encounter: Payer: Self-pay | Admitting: Internal Medicine

## 2014-10-05 ENCOUNTER — Other Ambulatory Visit: Payer: Self-pay | Admitting: *Deleted

## 2014-10-05 MED ORDER — LEVOTHYROXINE SODIUM 137 MCG PO TABS: 137.0000 ug | ORAL_TABLET | Freq: Every day | ORAL | Status: DC

## 2014-10-11 ENCOUNTER — Ambulatory Visit (INDEPENDENT_AMBULATORY_CARE_PROVIDER_SITE_OTHER): Payer: Medicaid Other | Admitting: Neurology

## 2014-10-11 ENCOUNTER — Encounter: Payer: Self-pay | Admitting: Neurology

## 2014-10-11 VITALS — BP 98/64 | HR 79 | Ht 68.0 in | Wt 202.8 lb

## 2014-10-11 DIAGNOSIS — D8689 Sarcoidosis of other sites: Secondary | ICD-10-CM

## 2014-10-11 DIAGNOSIS — R269 Unspecified abnormalities of gait and mobility: Secondary | ICD-10-CM

## 2014-10-11 DIAGNOSIS — Z5181 Encounter for therapeutic drug level monitoring: Secondary | ICD-10-CM

## 2014-10-11 DIAGNOSIS — G479 Sleep disorder, unspecified: Secondary | ICD-10-CM

## 2014-10-11 MED ORDER — ZOLPIDEM TARTRATE 5 MG PO TABS: 5.0000 mg | ORAL_TABLET | Freq: Every evening | ORAL | Status: DC | PRN

## 2014-10-11 MED ORDER — HYDROCODONE-ACETAMINOPHEN 5-325 MG PO TABS: 1.0000 | ORAL_TABLET | Freq: Four times a day (QID) | ORAL | Status: DC | PRN

## 2014-10-11 NOTE — Progress Notes (Signed)
Reason for visit: headache  [name] [name] is an 48 y.o. 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 The patient has frequent headaches, almost daily. The patient does not wish to pursue getting Botox treatments for her headache. The patient continues to note some problems with balance. She has been on several medications without benefit that include gabapentin. The patient has not gained benefit with tricyclic antidepressants. She has fallen on occasion. She reports no numbness arms or legs. She does have dizziness at times. She comes to this office for an evaluation.  Past Medical History  Diagnosis Date  . Sarcoidosis   . OSA (obstructive sleep apnea)   . Prolonged QT interval   . Osteoporosis   . Anemia   . GERD (gastroesophageal reflux disease)   . DM (diabetes mellitus)   . Peripheral neuropathy   . HTN (hypertension)   . Nephrolithiasis   . Chronic fatigue   . Obesity   . Headache(784.0)   . Gait disturbance     Past Surgical History  Procedure Laterality Date  . Hysteroscopy    . Dilation and curettage of uterus    . Partial hysterectomy  2013  . Suboccipital craniotomy    . Thyroidectomy      Precancerous lesion  . Rotator cuff repair Right     07/2013    Family History  Problem Relation Age of Onset  . Heart disease Mother     questionable CAD and arrythmias   . Cancer Mother     Breast cancer  . Venous thrombosis Sister   . Diabetes Sister   . Heart disease Sister   . Diabetes Brother   . Heart disease Brother   . Cancer Maternal Aunt   . Venous thrombosis Sister   . Diabetes Sister   . Heart disease Sister     Social history:  reports that she has never smoked. She has never used smokeless tobacco. She reports that she does not drink alcohol or use illicit drugs.   No Known Allergies  Medications:  Current Outpatient Prescriptions on File  Prior to Visit  Medication Sig Dispense Refill  . amLODipine (NORVASC) 10 MG tablet Take 1 tablet (10 mg total) by mouth every morning. 30 tablet 3  . aspirin 81 MG tablet Take 81 mg by mouth every morning.     Marland Kitchen atorvastatin (LIPITOR) 20 MG tablet Take 20 mg by mouth every evening.     . fluticasone (FLONASE) 50 MCG/ACT nasal spray Place 2 sprays into both nostrils daily. 16 g 0  . furosemide (LASIX) 40 MG tablet TAKE 1 TABLET (40 MG TOTAL) BY MOUTH 2 (TWO) TIMES DAILY. 180 tablet 1  . levothyroxine (SYNTHROID, LEVOTHROID) 137 MCG tablet Take 1 tablet (137 mcg total) by mouth daily before breakfast. 60 tablet 0  . metFORMIN (GLUCOPHAGE) 500 MG tablet TAKE 1 TABLET (500 MG TOTAL) BY MOUTH 2 (TWO) TIMES DAILY WITH A MEAL. 180 tablet 3  . methotrexate (RHEUMATREX) 2.5 MG tablet Take 10 mg by mouth once a week. On Saturdays   Caution:Chemotherapy. Protect from light.    . Multiple Vitamin (MULTIVITAMIN WITH MINERALS) TABS tablet Take 1 tablet by mouth every evening.     . promethazine (PHENERGAN) 12.5 MG tablet Take 1 tablet (12.5 mg total) by mouth every 6 (six) hours as needed for nausea or vomiting. 30 tablet 0  No current facility-administered medications on file prior to visit.    ROS:  Out of a complete 14 system review of symptoms, the patient complains only of the following symptoms, and all other reviewed systems are negative.  Double vision Chest pain Constipation Back pain Headache  Blood pressure 98/64, pulse 79, height 5\' 8"  (1.727 m), weight 202 lb 12.8 oz (91.989 kg), last menstrual period 12/03/2011.  Physical Exam  General: The patient is alert and cooperative at the time of the examination.  Skin: No significant peripheral edema is noted.   Neurologic Exam  Mental status: The patient is oriented x 3.  Cranial nerves: Facial symmetry is present. Speech is normal, no aphasia or dysarthria is noted. Extraocular movements are full. Visual fields are full.  Motor:  The patient has good strength in all 4 extremities.  Sensory examination: Soft touch sensation is symmetric on the face, arms, and legs.  Coordination: The patient has good finger-nose-finger and heel-to-shin bilaterally.  Gait and station: The patient has a normal gait. Tandem gait is unsteady. Romberg is negative. No drift is seen.  Reflexes: Deep tendon reflexes are symmetric.   Assessment/Plan:  1. Neurosarcoidosis  2. Headache  3. Mild gait disturbance  The patient was given a prescription for hydrocodone. She will be sent for blood work today. The patient remains on methotrexate. The patient does not wish to go on other medication such as propranolol at this time for treatment for her headache.  Jill Alexanders MD 10/11/2014 7:26 PM  [facility] [address] [place],  [address]  Phone [phone] Fax [phone]

## 2014-10-11 NOTE — Patient Instructions (Signed)

## 2014-10-12 LAB — CBC WITH DIFFERENTIAL
BASOS ABS: 0 10*3/uL (ref 0.0–0.2)
Basos: 0 %
Eos: 1 %
Eosinophils Absolute: 0.1 10*3/uL (ref 0.0–0.4)
HCT: 37.9 % (ref 34.0–46.6)
Hemoglobin: 12.4 g/dL (ref 11.1–15.9)
Immature Grans (Abs): 0 10*3/uL (ref 0.0–0.1)
Immature Granulocytes: 0 %
LYMPHS ABS: 2.2 10*3/uL (ref 0.7–3.1)
LYMPHS: 36 %
MCH: 28.1 pg (ref 26.6–33.0)
MCHC: 32.7 g/dL (ref 31.5–35.7)
MCV: 86 fL (ref 79–97)
MONOS ABS: 0.6 10*3/uL (ref 0.1–0.9)
Monocytes: 9 %
Neutrophils Absolute: 3.3 10*3/uL (ref 1.4–7.0)
Neutrophils Relative %: 54 %
Platelets: 299 10*3/uL (ref 150–379)
RBC: 4.42 x10E6/uL (ref 3.77–5.28)
RDW: 14.2 % (ref 12.3–15.4)
WBC: 6.1 10*3/uL (ref 3.4–10.8)

## 2014-10-12 LAB — COMPREHENSIVE METABOLIC PANEL
ALT: 23 IU/L (ref 0–32)
AST: 16 IU/L (ref 0–40)
Albumin/Globulin Ratio: 1.5 (ref 1.1–2.5)
Albumin: 4.1 g/dL (ref 3.5–5.5)
Alkaline Phosphatase: 72 IU/L (ref 39–117)
BUN/Creatinine Ratio: 26 — ABNORMAL HIGH (ref 9–23)
BUN: 20 mg/dL (ref 6–24)
CO2: 27 mmol/L (ref 18–29)
Calcium: 8.9 mg/dL (ref 8.7–10.2)
Chloride: 99 mmol/L (ref 97–108)
Creatinine, Ser: 0.77 mg/dL (ref 0.57–1.00)
GFR calc non Af Amer: 93 mL/min/{1.73_m2} (ref >59)
GFR, EST AFRICAN AMERICAN: 107 mL/min/{1.73_m2} (ref >59)
GLOBULIN, TOTAL: 2.7 g/dL (ref 1.5–4.5)
GLUCOSE: 127 mg/dL — AB (ref 65–99)
POTASSIUM: 4.5 mmol/L (ref 3.5–5.2)
SODIUM: 139 mmol/L (ref 134–144)
TOTAL PROTEIN: 6.8 g/dL (ref 6.0–8.5)
Total Bilirubin: <0.2 mg/dL (ref 0.0–1.2)

## 2014-10-14 ENCOUNTER — Other Ambulatory Visit: Payer: Self-pay | Admitting: Internal Medicine

## 2014-10-18 ENCOUNTER — Telehealth: Payer: Self-pay | Admitting: *Deleted

## 2014-10-18 MED ORDER — ESOMEPRAZOLE MAGNESIUM 40 MG PO CPDR: 40.0000 mg | DELAYED_RELEASE_CAPSULE | Freq: Every day | ORAL | Status: DC

## 2014-10-18 NOTE — Telephone Encounter (Signed)
[facility] is requesting refill esomprazole mag dr 40 mg #90 - last filled 09/13/14 and written 10/10/13. Hilda Blades Topher Buenaventura RN 10/18/14 10AM

## 2014-10-29 ENCOUNTER — Other Ambulatory Visit: Payer: Self-pay

## 2014-10-29 MED ORDER — METHOTREXATE 2.5 MG PO TABS: 10.0000 mg | ORAL_TABLET | ORAL | Status: DC

## 2014-11-07 ENCOUNTER — Other Ambulatory Visit: Payer: Self-pay | Admitting: *Deleted

## 2014-11-09 MED ORDER — ATORVASTATIN CALCIUM 20 MG PO TABS: 20.0000 mg | ORAL_TABLET | Freq: Every evening | ORAL | Status: DC

## 2014-11-20 ENCOUNTER — Other Ambulatory Visit: Payer: Self-pay | Admitting: *Deleted

## 2014-11-21 MED ORDER — FUROSEMIDE 40 MG PO TABS: 40.0000 mg | ORAL_TABLET | Freq: Every day | ORAL | Status: DC

## 2014-12-04 ENCOUNTER — Other Ambulatory Visit: Payer: Medicaid Other

## 2014-12-06 ENCOUNTER — Other Ambulatory Visit: Payer: Self-pay | Admitting: Internal Medicine

## 2014-12-06 ENCOUNTER — Encounter: Payer: Self-pay | Admitting: Internal Medicine

## 2014-12-06 ENCOUNTER — Ambulatory Visit (INDEPENDENT_AMBULATORY_CARE_PROVIDER_SITE_OTHER): Payer: Medicaid Other | Admitting: Internal Medicine

## 2014-12-06 VITALS — BP 127/71 | HR 91 | Temp 98.1°F | Ht 68.0 in | Wt 205.8 lb

## 2014-12-06 DIAGNOSIS — Z0000 Encounter for general adult medical examination without abnormal findings: Secondary | ICD-10-CM

## 2014-12-06 DIAGNOSIS — M1612 Unilateral primary osteoarthritis, left hip: Secondary | ICD-10-CM

## 2014-12-06 DIAGNOSIS — Z23 Encounter for immunization: Secondary | ICD-10-CM

## 2014-12-06 DIAGNOSIS — D8689 Sarcoidosis of other sites: Secondary | ICD-10-CM

## 2014-12-06 DIAGNOSIS — I10 Essential (primary) hypertension: Secondary | ICD-10-CM

## 2014-12-06 DIAGNOSIS — E039 Hypothyroidism, unspecified: Secondary | ICD-10-CM

## 2014-12-06 DIAGNOSIS — G8929 Other chronic pain: Secondary | ICD-10-CM

## 2014-12-06 DIAGNOSIS — M810 Age-related osteoporosis without current pathological fracture: Secondary | ICD-10-CM

## 2014-12-06 DIAGNOSIS — E119 Type 2 diabetes mellitus without complications: Secondary | ICD-10-CM

## 2014-12-06 DIAGNOSIS — M25552 Pain in left hip: Secondary | ICD-10-CM

## 2014-12-06 DIAGNOSIS — R1084 Generalized abdominal pain: Secondary | ICD-10-CM

## 2014-12-06 LAB — LIPID PANEL
CHOL/HDL RATIO: 2.3 ratio
CHOLESTEROL: 124 mg/dL (ref 0–200)
HDL: 53 mg/dL (ref >39)
LDL Cholesterol: 57 mg/dL (ref 0–99)
TRIGLYCERIDES: 68 mg/dL (ref <150)
VLDL: 14 mg/dL (ref 0–40)

## 2014-12-06 LAB — GLUCOSE, CAPILLARY: Glucose-Capillary: 143 mg/dL — ABNORMAL HIGH (ref 70–99)

## 2014-12-06 LAB — POCT GLYCOSYLATED HEMOGLOBIN (HGB A1C): Hemoglobin A1C: 7.6

## 2014-12-06 LAB — TSH: TSH: 1.352 u[IU]/mL (ref 0.350–4.500)

## 2014-12-06 MED ORDER — ATORVASTATIN CALCIUM 20 MG PO TABS: 20.0000 mg | ORAL_TABLET | Freq: Every evening | ORAL | Status: DC

## 2014-12-06 MED ORDER — IBUPROFEN 600 MG PO TABS: 600.0000 mg | ORAL_TABLET | Freq: Three times a day (TID) | ORAL | Status: DC | PRN

## 2014-12-06 MED ORDER — DICLOFENAC SODIUM 1 % TD GEL: 2.0000 g | Freq: Four times a day (QID) | TRANSDERMAL | Status: DC

## 2014-12-06 MED ORDER — LISINOPRIL 5 MG PO TABS: 5.0000 mg | ORAL_TABLET | Freq: Every day | ORAL | Status: DC

## 2014-12-06 NOTE — Progress Notes (Signed)
Internal Medicine Clinic Attending  The patient will eventually need higher dose than 5 mg of lisinopril to match 10 mg of amlodipine which Dr Gordy Levan has discontinued. 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 She will also need a BMP to monitor creatinine levels next visit.   Case discussed with Dr. Gordy Levan soon after the resident saw the patient.  We reviewed the resident's history and exam and pertinent patient test results.  I agree with the assessment, diagnosis, and plan of care documented in the resident's note.

## 2014-12-06 NOTE — Assessment & Plan Note (Addendum)
Pt reports abdominal pain that is constant.  She follows with Dr. Benson Norway and saw him on Monday.  He put her on a new medication for constipation and stated that she will need to have her colon removed in the future but wanted to wait ~1 year.  She has been to general surgery it appears.  She also states that she has a new pain that radiates from her right flank area into her lower abdominal area.  She has a h/o nephrolithiasis but states this does not feel the same and the pain is constant and sharp when it comes on.  Denies fever/chills.  Has nausea which is chronic.  She has had multiple CT scans and I do not feel that additional scans are warranted currently.  She denies any urinary symptoms including dysuria, frequency, hesitency, hematuria, or discharge.  Had a partial hysterectomy several years ago for uterine fibroids.   -continue to follow-up with Dr. Benson Norway  -will check UA

## 2014-12-06 NOTE — Progress Notes (Signed)
Patient ID: [name] [name], female   DOB: 11/15/1966, 49 y.o.   MRN: [id number]    Subjective:   Patient ID: [name] [name] female    DOB: Jul 15, 1966 49 y.o.    MRN: [id number] Health Maintenance Due: Health Maintenance Due  Topic Date Due  . INFLUENZA VACCINE  07/01/2014  . HEMOGLOBIN A1C  09/29/2014  . FOOT EXAM  10/10/2014  . LIPID PANEL  10/10/2014  . URINE MICROALBUMIN  10/10/2014  . OPHTHALMOLOGY EXAM  10/24/2014    _________________________________________________  HPI: [name] is a 49 y.o. female here for an acute visit for chronic abdominal [name]. Pt has a PMH outlined below.  Please see problem-based charting assessment and plan note for further details of medical issues addressed at today's visit.  PMH: Past Medical History  Diagnosis Date  . Sarcoidosis   . OSA (obstructive sleep apnea)   . Prolonged QT interval   . Osteoporosis   . Anemia   . GERD (gastroesophageal reflux disease)   . DM (diabetes mellitus)   . Peripheral neuropathy   . HTN (hypertension)   . Nephrolithiasis   . Chronic fatigue   . Obesity   . Headache(784.0)   . Gait disturbance     Medications: Current Outpatient Prescriptions on File Prior to Visit  Medication Sig Dispense Refill  . amLODipine (NORVASC) 10 MG tablet TAKE 1 TABLET (10 MG TOTAL) BY MOUTH EVERY MORNING. 30 tablet 3  . aspirin 81 MG tablet Take 81 mg by mouth every morning.     Marland Kitchen atorvastatin (LIPITOR) 20 MG tablet Take 1 tablet (20 mg total) by mouth every evening. 31 tablet 6  . esomeprazole (NEXIUM) 40 MG capsule Take 1 capsule (40 mg total) by mouth daily. 60 capsule 1  . fluticasone (FLONASE) 50 MCG/ACT nasal spray Place 2 sprays into both nostrils daily. 16 g 0  . furosemide (LASIX) 40 MG tablet Take 1 tablet (40 mg total) by mouth daily. 180 tablet 1  . levothyroxine (SYNTHROID, LEVOTHROID) 137 MCG tablet Take 1 tablet (137 mcg total) by mouth daily before breakfast. 60 tablet 0  . metFORMIN  (GLUCOPHAGE) 500 MG tablet TAKE 1 TABLET (500 MG TOTAL) BY MOUTH 2 (TWO) TIMES DAILY WITH A MEAL. 180 tablet 3  . methotrexate (RHEUMATREX) 2.5 MG tablet Take 4 tablets (10 mg total) by mouth once a week. 48 tablet 1  . Multiple Vitamin (MULTIVITAMIN WITH MINERALS) TABS tablet Take 1 tablet by mouth every evening.     . promethazine (PHENERGAN) 12.5 MG tablet Take 1 tablet (12.5 mg total) by mouth every 6 (six) hours as needed for nausea or vomiting. 30 tablet 0  . HYDROcodone-acetaminophen (NORCO/VICODIN) 5-325 MG per tablet Take 1 tablet by mouth every 6 (six) hours as needed for moderate [name]. (Patient not taking: Reported on 12/06/2014) 40 tablet 0  . zolpidem (AMBIEN) 5 MG tablet Take 1 tablet (5 mg total) by mouth at bedtime as needed for sleep. (Patient not taking: Reported on 12/06/2014) 30 tablet 5   No current facility-administered medications on file prior to visit.    Allergies: No Known Allergies  FH: Family History  Problem Relation Age of Onset  . Heart disease Mother     questionable CAD and arrythmias   . Cancer Mother     Breast cancer  . Venous thrombosis Sister   . Diabetes Sister   . Heart disease Sister   . Diabetes Brother   . Heart disease Brother   .  Cancer Maternal Aunt   . Venous thrombosis Sister   . Diabetes Sister   . Heart disease Sister     SH: History   Social History  . Marital Status: Married    Spouse Name: N/A    Number of Children: 4  . Years of Education: 12   Occupational History  .     Social History Main Topics  . Smoking status: Never Smoker   . Smokeless tobacco: Never Used  . Alcohol Use: No  . Drug Use: No  . Sexual Activity: None     Comment: hysterectomy   Other Topics Concern  . None   Social History Narrative   Lives in [place] with 2 sons and her husband;disabled; no use of tobacco products nor alcohol.   Patient given diabetes card on 12/05/2010.    Review of Systems: Constitutional: Negative for fever,  chills and weight loss.  Eyes: Negative for blurred vision.  Respiratory: Negative for cough and shortness of breath.  Cardiovascular: Negative for chest [name], palpitations and leg swelling.  Gastrointestinal: +nausea, +vomiting, +abdominal [name], -diarrhea, +constipation and -blood in stool.  Genitourinary: Negative for dysuria, urgency and frequency.  Musculoskeletal: Negative for myalgias and back [name].  Neurological: Negative for dizziness, weakness and headaches.     Objective:   Vital Signs: Filed Vitals:   12/06/14 1442  BP: 127/71  Pulse: 91  Temp: 98.1 F (36.7 C)  TempSrc: Oral  Height: 5\' 8"  (1.727 m)  Weight: 205 lb 12.8 oz (93.35 kg)  SpO2: 100%      BP Readings from Last 3 Encounters:  12/06/14 127/71  10/11/14 98/64  06/29/14 107/71    Physical Exam: Constitutional: Vital signs reviewed.  Patient is well-developed and well-nourished in NAD and cooperative with exam.  Head: Normocephalic and atraumatic. Eyes: PERRL, EOMI, conjunctivae nl, no scleral icterus.  Neck: Supple. Cardiovascular: RRR, no MRG. Pulmonary/Chest: normal effort, non-tender to palpation, CTAB, no wheezes, rales, or rhonchi. Abdominal: Soft. NT/ND +BS. Neurological: A&O x3, cranial nerves II-XII are grossly intact, moving all extremities. Extremities: 2+DP b/l; no pitting edema. Skin: Warm, dry and intact. No rash.   Assessment & Plan:   Assessment and plan was discussed and formulated with my attending.

## 2014-12-06 NOTE — Assessment & Plan Note (Signed)
Currently only on metformin bid. -check HA1c -follow up in 01/01/15

## 2014-12-06 NOTE — Assessment & Plan Note (Signed)
Pt states this pain is chronic and has been seen by SM in the past.  States that voltaren gel and ibuprofen have worked in the past.  I cautioned her on using these together as voltaren may also be absorbed systemically. -prescription for voltaren gel sent to pharmacy with ibuprofen 600mg  q8 hrs PRN

## 2014-12-06 NOTE — Assessment & Plan Note (Signed)
-  influenza vaccine given today

## 2014-12-06 NOTE — Assessment & Plan Note (Signed)
BP 127/71 on amlodpine 10mg  and lasix 40mg  daily.  She states she takes lasix for LE swelling.  She does not ever recall being on lisinopril.  I think with her DM, lisinopril would be a better choice for her and amlodipine may also be contributing to LE swelling.  She is agreeable to this. -d/c amlodipine 10mg  daily -being lisinopril 5mg  daily and continue lasix 40mg  for now -follow up with Dr. Raelene Bott on 01/01/15 for BP recheck and labs

## 2014-12-06 NOTE — Assessment & Plan Note (Addendum)
Reports compliance with synthroid 148mcg daily.  -check TSH

## 2014-12-06 NOTE — Patient Instructions (Addendum)
Thank you for your visit today.   Please return to the internal medicine clinic on 01/01/2015 to see your primary doctor, Dr. Raelene Bott. We will check your labs today. I have discontinued your amlodipine as this may contribute to leg swelling.  I have sent a prescription for lisinopril to your pharmacy-->please take this daily instead of amlodipine. We will need to repeat your labs at your next visit with Dr. Raelene Bott and your blood pressure on lisinopril.  Please be sure to bring all of your medications with you to every visit; this includes herbal supplements, vitamins, eye drops, and any over-the-counter medications.   Should you have any questions regarding your medications and/or any new or worsening symptoms, please be sure to call the clinic at [phone].   If you believe that you are suffering from a life threatening condition or one that may result in the loss of limb or function, then you should call 911 or proceed to the nearest Emergency Department.

## 2014-12-07 LAB — MICROALBUMIN / CREATININE URINE RATIO
Creatinine, Urine: 65.5 mg/dL
Microalb Creat Ratio: 7.6 mg/g (ref 0.0–30.0)
Microalb, Ur: 0.5 mg/dL (ref <2.0)

## 2014-12-07 LAB — URINALYSIS, ROUTINE W REFLEX MICROSCOPIC
Bilirubin Urine: NEGATIVE
GLUCOSE, UA: NEGATIVE mg/dL
Hgb urine dipstick: NEGATIVE
Ketones, ur: NEGATIVE mg/dL
LEUKOCYTES UA: NEGATIVE
NITRITE: NEGATIVE
PH: 6 (ref 5.0–8.0)
Protein, ur: NEGATIVE mg/dL
Specific Gravity, Urine: 1.013 (ref 1.005–1.030)
Urobilinogen, UA: 0.2 mg/dL (ref 0.0–1.0)

## 2014-12-13 ENCOUNTER — Other Ambulatory Visit: Payer: Self-pay | Admitting: Internal Medicine

## 2014-12-13 ENCOUNTER — Telehealth: Payer: Self-pay | Admitting: *Deleted

## 2014-12-13 DIAGNOSIS — Z90711 Acquired absence of uterus with remaining cervical stump: Secondary | ICD-10-CM

## 2014-12-13 NOTE — Telephone Encounter (Signed)
Pt was calling for lab results - look good per Dr Gordy Levan. Pt wants referral to GYN - has Medicaid. Pt decided to go to [facility] GYN clinic. Hilda Blades Jamaris Biernat RN 12/13/14 2:30PM

## 2014-12-19 ENCOUNTER — Emergency Department (HOSPITAL_COMMUNITY)
Admission: EM | Admit: 2014-12-19 | Discharge: 2014-12-19 | Disposition: A | Discharge status: Home / Self Care | Payer: Medicaid Other | Attending: Emergency Medicine | Admitting: Emergency Medicine

## 2014-12-19 ENCOUNTER — Encounter (HOSPITAL_COMMUNITY): Payer: Self-pay | Admitting: Emergency Medicine

## 2014-12-19 ENCOUNTER — Encounter: Payer: Self-pay | Admitting: Obstetrics and Gynecology

## 2014-12-19 ENCOUNTER — Emergency Department (HOSPITAL_COMMUNITY): Payer: Medicaid Other

## 2014-12-19 DIAGNOSIS — Z7951 Long term (current) use of inhaled steroids: Secondary | ICD-10-CM | POA: Diagnosis not present

## 2014-12-19 DIAGNOSIS — M791 Myalgia: Secondary | ICD-10-CM | POA: Diagnosis not present

## 2014-12-19 DIAGNOSIS — R5383 Other fatigue: Secondary | ICD-10-CM | POA: Diagnosis not present

## 2014-12-19 DIAGNOSIS — Z862 Personal history of diseases of the blood and blood-forming organs and certain disorders involving the immune mechanism: Secondary | ICD-10-CM | POA: Insufficient documentation

## 2014-12-19 DIAGNOSIS — R51 Headache: Secondary | ICD-10-CM | POA: Insufficient documentation

## 2014-12-19 DIAGNOSIS — Z87442 Personal history of urinary calculi: Secondary | ICD-10-CM | POA: Insufficient documentation

## 2014-12-19 DIAGNOSIS — I10 Essential (primary) hypertension: Secondary | ICD-10-CM | POA: Diagnosis not present

## 2014-12-19 DIAGNOSIS — E669 Obesity, unspecified: Secondary | ICD-10-CM | POA: Diagnosis not present

## 2014-12-19 DIAGNOSIS — Z7982 Long term (current) use of aspirin: Secondary | ICD-10-CM | POA: Insufficient documentation

## 2014-12-19 DIAGNOSIS — Z791 Long term (current) use of non-steroidal anti-inflammatories (NSAID): Secondary | ICD-10-CM | POA: Insufficient documentation

## 2014-12-19 DIAGNOSIS — Z79899 Other long term (current) drug therapy: Secondary | ICD-10-CM | POA: Insufficient documentation

## 2014-12-19 DIAGNOSIS — E119 Type 2 diabetes mellitus without complications: Secondary | ICD-10-CM | POA: Insufficient documentation

## 2014-12-19 DIAGNOSIS — J069 Acute upper respiratory infection, unspecified: Secondary | ICD-10-CM

## 2014-12-19 DIAGNOSIS — K219 Gastro-esophageal reflux disease without esophagitis: Secondary | ICD-10-CM | POA: Insufficient documentation

## 2014-12-19 DIAGNOSIS — G629 Polyneuropathy, unspecified: Secondary | ICD-10-CM | POA: Insufficient documentation

## 2014-12-19 DIAGNOSIS — Z86011 Personal history of benign neoplasm of the brain: Secondary | ICD-10-CM | POA: Insufficient documentation

## 2014-12-19 DIAGNOSIS — R0981 Nasal congestion: Secondary | ICD-10-CM | POA: Diagnosis present

## 2014-12-19 LAB — CBC WITH DIFFERENTIAL/PLATELET
Basophils Absolute: 0 10*3/uL (ref 0.0–0.1)
Basophils Relative: 0 % (ref 0–1)
Eosinophils Absolute: 0.1 10*3/uL (ref 0.0–0.7)
Eosinophils Relative: 1 % (ref 0–5)
HEMATOCRIT: 40.1 % (ref 36.0–46.0)
Hemoglobin: 12.9 g/dL (ref 12.0–15.0)
LYMPHS ABS: 1.2 10*3/uL (ref 0.7–4.0)
LYMPHS PCT: 15 % (ref 12–46)
MCH: 28.1 pg (ref 26.0–34.0)
MCHC: 32.2 g/dL (ref 30.0–36.0)
MCV: 87.4 fL (ref 78.0–100.0)
MONOS PCT: 7 % (ref 3–12)
Monocytes Absolute: 0.6 10*3/uL (ref 0.1–1.0)
NEUTROS ABS: 6.1 10*3/uL (ref 1.7–7.7)
NEUTROS PCT: 77 % (ref 43–77)
Platelets: 299 10*3/uL (ref 150–400)
RBC: 4.59 MIL/uL (ref 3.87–5.11)
RDW: 13.9 % (ref 11.5–15.5)
WBC: 7.9 10*3/uL (ref 4.0–10.5)

## 2014-12-19 LAB — BASIC METABOLIC PANEL
Anion gap: 8 (ref 5–15)
BUN: 14 mg/dL (ref 6–23)
CHLORIDE: 106 meq/L (ref 96–112)
CO2: 25 mmol/L (ref 19–32)
Calcium: 8.9 mg/dL (ref 8.4–10.5)
Creatinine, Ser: 0.7 mg/dL (ref 0.50–1.10)
GFR calc Af Amer: >90 mL/min (ref >90)
GLUCOSE: 91 mg/dL (ref 70–99)
Potassium: 3.7 mmol/L (ref 3.5–5.1)
Sodium: 139 mmol/L (ref 135–145)

## 2014-12-19 MED ORDER — SODIUM CHLORIDE 0.9 % IV BOLUS (SEPSIS)
1000.0000 mL | Freq: Once | INTRAVENOUS | Status: AC
  Administered 2014-12-19: 1000 mL via INTRAVENOUS

## 2014-12-19 MED ORDER — DM-GUAIFENESIN ER 30-600 MG PO TB12: 1.0000 | ORAL_TABLET | Freq: Two times a day (BID) | ORAL | Status: DC

## 2014-12-19 MED ORDER — ACETAMINOPHEN 500 MG PO TABS
500.0000 mg | ORAL_TABLET | Freq: Once | ORAL | Status: AC
  Administered 2014-12-19: 500 mg via ORAL
  Filled 2014-12-19: qty 1

## 2014-12-19 NOTE — ED Notes (Signed)
Pt with Hx of brain tumor s/t sarcoidosis, last chemo Tx on Saturday, cold and flu symptoms began on Sunday. Pt c/o headache, chills, nausea, fatigue, nasal congestion, productive cough with green mucus.

## 2014-12-19 NOTE — ED Provider Notes (Signed)
CSN: [id number]     Arrival date & time 12/19/14  1815 History   First MD Initiated Contact with Patient 12/19/14 1833     No chief complaint on file.  [name] [name] is a 49 y.o. female with a history of neurosarcoidosis on methotrexate, and diabetes who presents to the emergency department complaining of flulike symptoms for the past 3 days. Patient reports headache, chills, nausea, fatigue, nasal congestion and nonproductive cough. Patient reports she takes methotrexate orally at home. Patient's last treatment was 4 days ago. Patient reports receiving her flu shot this year. The patient reports she gets frequent headaches here neuro sarcoidosis and reports this headache is similar. Patient reports her pain is 7 out of 10. Patient has attempted no treatments today. The patient denies abdominal pain, nausea, vomiting, sick contacts, shortness of breath, chest pain, palpitations, change in vision, numbness or tingling.  (Consider location/radiation/quality/duration/timing/severity/associated sxs/prior Treatment) HPI  Past Medical History  Diagnosis Date  . Sarcoidosis   . OSA (obstructive sleep apnea)   . Prolonged QT interval   . Osteoporosis   . Anemia   . GERD (gastroesophageal reflux disease)   . DM (diabetes mellitus)   . Peripheral neuropathy   . HTN (hypertension)   . Nephrolithiasis   . Chronic fatigue   . Obesity   . Headache(784.0)   . Gait disturbance   . Brain tumor    Past Surgical History  Procedure Laterality Date  . Hysteroscopy    . Dilation and curettage of uterus    . Partial hysterectomy  2013  . Suboccipital craniotomy    . Thyroidectomy      Precancerous lesion  . Rotator cuff repair Right     07/2013   Family History  Problem Relation Age of Onset  . Heart disease Mother     questionable CAD and arrythmias   . Cancer Mother     Breast cancer  . Venous thrombosis Sister   . Diabetes Sister   . Heart disease Sister   . Diabetes Brother   . Heart  disease Brother   . Cancer Maternal Aunt   . Venous thrombosis Sister   . Diabetes Sister   . Heart disease Sister    History  Substance Use Topics  . Smoking status: Never Smoker   . Smokeless tobacco: Never Used  . Alcohol Use: No   OB History    Gravida Para Term Preterm AB TAB SAB Ectopic Multiple Living   5 4        4      Review of Systems  Constitutional: Positive for fever, chills and fatigue.  HENT: Positive for congestion and rhinorrhea. Negative for ear pain, sinus pressure, sore throat and trouble swallowing.   Eyes: Negative for pain and visual disturbance.  Respiratory: Positive for cough. Negative for shortness of breath and wheezing.   Cardiovascular: Negative for chest pain and palpitations.  Gastrointestinal: Negative for nausea, vomiting, abdominal pain and diarrhea.  Genitourinary: Negative for dysuria and hematuria.  Musculoskeletal: Positive for myalgias and arthralgias. Negative for back pain and neck pain.  Skin: Negative for rash.  Neurological: Positive for headaches. Negative for light-headedness and numbness.      Allergies  Review of patient's allergies indicates no known allergies.  Home Medications   Prior to Admission medications   Medication Sig Start Date End Date Taking? Authorizing Provider  aspirin 81 MG tablet Take 81 mg by mouth at bedtime.    Yes Historical Provider, MD  atorvastatin (LIPITOR) 20 MG tablet Take 1 tablet (20 mg total) by mouth every evening. 12/06/14  Yes Jones Bales, MD  esomeprazole (NEXIUM) 40 MG capsule Take 1 capsule (40 mg total) by mouth daily. 10/18/14 10/18/15 Yes Luan Moore, MD  fluticasone (FLONASE) 50 MCG/ACT nasal spray Place 2 sprays into both nostrils daily. 01/11/14 01/11/15 Yes Effie Berkshire, MD  furosemide (LASIX) 40 MG tablet Take 1 tablet (40 mg total) by mouth daily. 11/21/14  Yes Luan Moore, MD  ibuprofen (ADVIL,MOTRIN) 600 MG tablet Take 1 tablet (600 mg total) by mouth every 8 (eight)  hours as needed. 12/06/14  Yes Jones Bales, MD  levothyroxine (SYNTHROID, LEVOTHROID) 137 MCG tablet TAKE 1 TABLET (137 MCG TOTAL) BY MOUTH DAILY BEFORE BREAKFAST. 12/08/14  Yes Luan Moore, MD  lisinopril (PRINIVIL,ZESTRIL) 5 MG tablet Take 1 tablet (5 mg total) by mouth daily. 12/06/14 12/06/15 Yes Jones Bales, MD  metFORMIN (GLUCOPHAGE) 500 MG tablet TAKE 1 TABLET (500 MG TOTAL) BY MOUTH 2 (TWO) TIMES DAILY WITH A MEAL.   Yes Dorian Heckle, MD  Multiple Vitamin (MULTIVITAMIN WITH MINERALS) TABS tablet Take 1 tablet by mouth every evening.    Yes Historical Provider, MD  Probiotic Product (PROBIOTIC DAILY PO) Take 1 capsule by mouth daily as needed (prevent infection).   Yes Historical Provider, MD  promethazine (PHENERGAN) 12.5 MG tablet Take 1 tablet (12.5 mg total) by mouth every 6 (six) hours as needed for nausea or vomiting. 06/29/14  Yes Arman Filter, MD  dextromethorphan-guaiFENesin (MUCINEX DM) 30-600 MG per 12 hr tablet Take 1 tablet by mouth 2 (two) times daily. 12/19/14   Verda Cumins Hisayo Delossantos, PA-C  diclofenac sodium (VOLTAREN) 1 % GEL Apply 2 g topically 4 (four) times daily. 12/06/14   Jones Bales, MD  methotrexate (RHEUMATREX) 2.5 MG tablet Take 4 tablets (10 mg total) by mouth once a week. 10/29/14   Kathrynn Ducking, MD  zolpidem (AMBIEN) 5 MG tablet Take 1 tablet (5 mg total) by mouth at bedtime as needed for sleep. 10/11/14   Kathrynn Ducking, MD   BP 141/68 mmHg  Pulse 87  Temp(Src) 99.6 F (37.6 C) (Oral)  Resp 18  SpO2 100%  LMP 12/03/2011 Physical Exam  Constitutional: She is oriented to person, place, and time. She appears well-developed and well-nourished. No distress.  HENT:  Head: Normocephalic and atraumatic.  Right Ear: External ear normal.  Left Ear: External ear normal.  Nose: Nose normal.  Mouth/Throat: Oropharynx is clear and moist. No oropharyngeal exudate.  Bilateral tympanic membranes are pearly-gray without erythema or loss of landmarks. No  temporal tenderness or edema.  No sinus tenderness.   Eyes: Conjunctivae and EOM are normal. Pupils are equal, round, and reactive to light. Right eye exhibits no discharge. Left eye exhibits no discharge.  Neck: Neck supple.  Cardiovascular: Normal rate, regular rhythm, normal heart sounds and intact distal pulses.  Exam reveals no gallop and no friction rub.   No murmur heard. Pulmonary/Chest: Effort normal and breath sounds normal. No respiratory distress. She has no wheezes. She has no rales.  Abdominal: Soft. Bowel sounds are normal. She exhibits no distension. There is no tenderness.  Musculoskeletal: She exhibits no edema.  Lymphadenopathy:    She has no cervical adenopathy.  Neurological: She is alert and oriented to person, place, and time. No cranial nerve deficit. Coordination normal.  Skin: Skin is warm and dry. No rash noted. She is not diaphoretic. No erythema. No pallor.  Psychiatric: She has a normal mood and affect. Her behavior is normal.  Nursing note and vitals reviewed.   ED Course  Procedures (including critical care time) Labs Review Labs Reviewed  CBC WITH DIFFERENTIAL  BASIC METABOLIC PANEL    Imaging Review Dg Chest 2 View  12/19/2014   CLINICAL DATA:  Cough and congestion for 2 days  EXAM: CHEST  2 VIEW  COMPARISON:  12/03/2013  FINDINGS: The heart size and mediastinal contours are within normal limits. Both lungs are clear. The visualized skeletal structures are unremarkable.  IMPRESSION: No active cardiopulmonary disease.   Electronically Signed   By: Inez Catalina M.D.   On: 12/19/2014 18:58     EKG Interpretation None      Filed Vitals:   12/19/14 1822 12/19/14 1855 12/19/14 2030  BP: 162/77  141/68  Pulse: 99 87 87  Temp: 99.6 F (37.6 C)    TempSrc: Oral    Resp: 20  18  SpO2: 97% 100% 100%     MDM   Meds given in ED:  Medications  acetaminophen (TYLENOL) tablet 500 mg (500 mg Oral Given 12/19/14 1928)  sodium chloride 0.9 % bolus  1,000 mL (0 mLs Intravenous Stopped 12/19/14 2137)    Discharge Medication List as of 12/19/2014  9:23 PM    START taking these medications   Details  dextromethorphan-guaiFENesin (MUCINEX DM) 30-600 MG per 12 hr tablet Take 1 tablet by mouth 2 (two) times daily., Starting 12/19/2014, Until Discontinued, Print        Final diagnoses:  Upper respiratory infection, viral   This is a 49 year old female with a history of neurosarcoidosis is on methotrexate presents the emergency department complaining of flulike symptoms for the past days. Patient is afebrile and nontoxic-appearing. Patient is not hypoxic or tachycardic. Patient is not tachycardic. Patient's lungs are clear auscultation bilaterally. The patient's CBC and BMP are unremarkable. Chest x-ray is negative. Patient is tolerating by mouth liquids the ED. Patient likely has flu versus upper respiratory infection. Patient is out of window for Tamiflu. We'll discharge patient with Mucinex DM for her cough and congestion. Strict return precautions are provided. I advised the patient to follow-up with their primary care provider this week. I advised the patient to return to the emergency department with new or worsening symptoms or new concerns. The patient verbalized understanding and agreement with plan.   This patient was discussed with Dr. Alvino Chapel who agrees with assessment and plan.      Hanley Hays, PA-C 12/20/14 0011  Jasper Riling. Alvino Chapel, MD 12/22/14 2251

## 2014-12-19 NOTE — Discharge Instructions (Signed)
Upper Respiratory Infection, Adult An upper respiratory infection (URI) is also sometimes known as the common cold. The upper respiratory tract includes the nose, sinuses, throat, trachea, and bronchi. Bronchi are the airways leading to the lungs. Most people improve within 1 week, but symptoms can last up to 2 weeks. A residual cough may last even longer.  CAUSES Many different viruses can infect the tissues lining the upper respiratory tract. The tissues become irritated and inflamed and often become very moist. Mucus production is also common. A cold is contagious. You can easily spread the virus to others by oral contact. This includes kissing, sharing a glass, coughing, or sneezing. Touching your mouth or nose and then touching a surface, which is then touched by another person, can also spread the virus. SYMPTOMS  Symptoms typically develop 1 to 3 days after you come in contact with a cold virus. Symptoms vary from person to person. They may include:  Runny nose.  Sneezing.  Nasal congestion.  Sinus irritation.  Sore throat.  Loss of voice (laryngitis).  Cough.  Fatigue.  Muscle aches.  Loss of appetite.  Headache.  Low-grade fever. DIAGNOSIS  You might diagnose your own cold based on familiar symptoms, since most people get a cold 2 to 3 times a year. Your caregiver can confirm this based on your exam. Most importantly, your caregiver can check that your symptoms are not due to another disease such as strep throat, sinusitis, pneumonia, asthma, or epiglottitis. Blood tests, throat tests, and X-rays are not necessary to diagnose a common cold, but they may sometimes be helpful in excluding other more serious diseases. Your caregiver will decide if any further tests are required. RISKS AND COMPLICATIONS  You may be at risk for a more severe case of the common cold if you smoke cigarettes, have chronic heart disease (such as heart failure) or lung disease (such as asthma), or if  you have a weakened immune system. The very young and very old are also at risk for more serious infections. Bacterial sinusitis, middle ear infections, and bacterial pneumonia can complicate the common cold. The common cold can worsen asthma and chronic obstructive pulmonary disease (COPD). Sometimes, these complications can require emergency medical care and may be life-threatening. PREVENTION  The best way to protect against getting a cold is to practice good hygiene. Avoid oral or hand contact with people with cold symptoms. Wash your hands often if contact occurs. There is no clear evidence that vitamin C, vitamin E, echinacea, or exercise reduces the chance of developing a cold. However, it is always recommended to get plenty of rest and practice good nutrition. TREATMENT  Treatment is directed at relieving symptoms. There is no cure. Antibiotics are not effective, because the infection is caused by a virus, not by bacteria. Treatment may include:  Increased fluid intake. Sports drinks offer valuable electrolytes, sugars, and fluids.  Breathing heated mist or steam (vaporizer or shower).  Eating chicken soup or other clear broths, and maintaining good nutrition.  Getting plenty of rest.  Using gargles or lozenges for comfort.  Controlling fevers with ibuprofen or acetaminophen as directed by your caregiver.  Increasing usage of your inhaler if you have asthma. Zinc gel and zinc lozenges, taken in the first 24 hours of the common cold, can shorten the duration and lessen the severity of symptoms. Pain medicines may help with fever, muscle aches, and throat pain. A variety of non-prescription medicines are available to treat congestion and runny nose. Your caregiver   can make recommendations and may suggest nasal or lung inhalers for other symptoms.  HOME CARE INSTRUCTIONS   Only take over-the-counter or prescription medicines for pain, discomfort, or fever as directed by your  caregiver.  Use a warm mist humidifier or inhale steam from a shower to increase air moisture. This may keep secretions moist and make it easier to breathe.  Drink enough water and fluids to keep your urine clear or pale yellow.  Rest as needed.  Return to work when your temperature has returned to normal or as your caregiver advises. You may need to stay home longer to avoid infecting others. You can also use a face mask and careful hand washing to prevent spread of the virus. SEEK MEDICAL CARE IF:   After the first few days, you feel you are getting worse rather than better.  You need your caregiver's advice about medicines to control symptoms.  You develop chills, worsening shortness of breath, or brown or red sputum. These may be signs of pneumonia.  You develop yellow or brown nasal discharge or pain in the face, especially when you bend forward. These may be signs of sinusitis.  You develop a fever, swollen neck glands, pain with swallowing, or white areas in the back of your throat. These may be signs of strep throat. SEEK IMMEDIATE MEDICAL CARE IF:   You have a fever.  You develop severe or persistent headache, ear pain, sinus pain, or chest pain.  You develop wheezing, a prolonged cough, cough up blood, or have a change in your usual mucus (if you have chronic lung disease).  You develop sore muscles or a stiff neck. Document Released: 05/13/2001 Document Revised: 02/09/2012 Document Reviewed: 02/22/2014 [employer] Patient Information 2015 [employer]. This information is not intended to replace advice given to you by your health care provider. Make sure you discuss any questions you have with your health care provider.  

## 2015-01-01 ENCOUNTER — Telehealth: Payer: Self-pay | Admitting: Internal Medicine

## 2015-01-01 NOTE — Telephone Encounter (Signed)
Patient confirmed

## 2015-01-01 NOTE — Telephone Encounter (Signed)
Call to patient to confirm appointment for 01/02/15 at 3:45. LMTCB

## 2015-01-02 ENCOUNTER — Encounter: Payer: Self-pay | Admitting: Internal Medicine

## 2015-01-02 ENCOUNTER — Ambulatory Visit (INDEPENDENT_AMBULATORY_CARE_PROVIDER_SITE_OTHER): Payer: Medicaid Other | Admitting: Internal Medicine

## 2015-01-02 VITALS — BP 135/70 | HR 64 | Temp 98.1°F | Ht 68.0 in | Wt 207.4 lb

## 2015-01-02 DIAGNOSIS — G8929 Other chronic pain: Secondary | ICD-10-CM

## 2015-01-02 DIAGNOSIS — G4700 Insomnia, unspecified: Secondary | ICD-10-CM

## 2015-01-02 DIAGNOSIS — E039 Hypothyroidism, unspecified: Secondary | ICD-10-CM

## 2015-01-02 DIAGNOSIS — J0191 Acute recurrent sinusitis, unspecified: Secondary | ICD-10-CM

## 2015-01-02 DIAGNOSIS — J329 Chronic sinusitis, unspecified: Secondary | ICD-10-CM | POA: Insufficient documentation

## 2015-01-02 DIAGNOSIS — M25552 Pain in left hip: Secondary | ICD-10-CM

## 2015-01-02 DIAGNOSIS — D8689 Sarcoidosis of other sites: Secondary | ICD-10-CM

## 2015-01-02 DIAGNOSIS — I10 Essential (primary) hypertension: Secondary | ICD-10-CM

## 2015-01-02 DIAGNOSIS — R1084 Generalized abdominal pain: Secondary | ICD-10-CM

## 2015-01-02 DIAGNOSIS — E119 Type 2 diabetes mellitus without complications: Secondary | ICD-10-CM

## 2015-01-02 HISTORY — DX: Insomnia, unspecified: G47.00

## 2015-01-02 MED ORDER — DOXYCYCLINE HYCLATE 100 MG PO CAPS: 100.0000 mg | ORAL_CAPSULE | Freq: Two times a day (BID) | ORAL | Status: AC

## 2015-01-02 MED ORDER — METFORMIN HCL 500 MG PO TABS: 1000.0000 mg | ORAL_TABLET | Freq: Two times a day (BID) | ORAL | Status: DC

## 2015-01-02 MED ORDER — IBUPROFEN 600 MG PO TABS: 600.0000 mg | ORAL_TABLET | Freq: Three times a day (TID) | ORAL | Status: DC | PRN

## 2015-01-02 MED ORDER — ZOLPIDEM TARTRATE 5 MG PO TABS: 5.0000 mg | ORAL_TABLET | Freq: Every evening | ORAL | Status: DC | PRN

## 2015-01-02 NOTE — Assessment & Plan Note (Signed)
Patient has been following up with GI with Dr. Benson Norway. Patient apparently received a colonoscopy recently and there was some discussion of a colectomy at some point in the future. However, patient is to be trialed on a year of more conservative management before surgical therapy according to the patient. No records in our system at this point regarding this discussion or the relevant diagnoses. -We will obtain records from Dr. Benson Norway

## 2015-01-02 NOTE — Assessment & Plan Note (Signed)
Patient states that she has had a 4 week history of headaches, nausea, fatigue, nasal congestion, mostly nonproductive cough, and nasal discharge that is light green. Patient states a significant sensation of postnasal drip. Patient was evaluated in the emergency department on 12/22/2014 and was sent home as she was found to be afebrile and nontoxic. Patient states that she is getting a little better in terms of her energy but states that her cough has not improved at all. Given the time course of her symptoms, patient likely has acute bacterial rhinosinusitis. Given that she is on methotrexate therapy, she is at high risk for resistance in terms of antibiotic treatment. Additionally, her methotrexate therapy with potentially interacts with penicillin therapy. -Doxycycline 100 mg twice a day for 7 days

## 2015-01-02 NOTE — Assessment & Plan Note (Addendum)
  Lab Results  Component Value Date   HGBA1C 7.6 12/06/2014   HGBA1C 6.9 06/29/2014   HGBA1C 6.9 01/11/2014     Assessment: Diabetes control: fair control Progress toward A1C goal:  unchanged Comments: A1c from January is deteriorated. Patient is only on 500 mg of metformin twice a day.   Plan: Medications:  Uptitrate metformin to 1000 mg twice a day. Home glucose monitoring: Frequency:   Timing:   Instruction/counseling given: discussed diet Educational resources provided:   Self management tools provided:   Other plans: Foot exam today, patient to follow-up for eye exam.

## 2015-01-02 NOTE — Progress Notes (Signed)
   Subjective:    Patient ID: [name] [name], female    DOB: [birth year], 49 y.o.   MRN: [id number]  HPI  Patient is a 49 year old female with a history of hypertension, type 2 diabetes, hypothyroidism, chronic left hip pain, who presents to clinic for upper respiratory tract infection symptoms.  Please refer to separate problem-list charting for more details.   Review of Systems  Constitutional: Negative for fever and chills.  HENT: Positive for congestion, postnasal drip and sneezing. Negative for dental problem, ear discharge, ear pain, facial swelling, hearing loss, mouth sores, nosebleeds, rhinorrhea, sinus pressure, sore throat and tinnitus.   Eyes: Negative for visual disturbance.  Respiratory: Negative for cough and shortness of breath.   Cardiovascular: Negative for chest pain and palpitations.  Gastrointestinal: Negative for nausea, vomiting, abdominal pain, diarrhea, constipation and blood in stool.  Genitourinary: Negative for dysuria and hematuria.  Neurological: Negative for syncope.       Objective:   Physical Exam  Constitutional: She is oriented to person, place, and time. She appears well-developed and well-nourished. No distress.  HENT:  Head: Normocephalic and atraumatic.  No tenderness upon frontal, maxillary sinus palpation  Eyes: EOM are normal. Pupils are equal, round, and reactive to light. Left eye exhibits no discharge.  Neck: Normal range of motion. Neck supple. No thyromegaly present.  Cardiovascular: Normal rate and regular rhythm.  Exam reveals no gallop and no friction rub.   No murmur heard. Pulmonary/Chest: Effort normal and breath sounds normal. No respiratory distress. She has no wheezes. She has no rales.  Abdominal: Soft. Bowel sounds are normal. She exhibits no distension. There is no tenderness. There is no rebound.  Musculoskeletal: She exhibits no edema.  Neurological: She is alert and oriented to person, place, and time. No cranial nerve  deficit.  Skin: Skin is warm and dry. No rash noted.  Psychiatric: She has a normal mood and affect. Thought content normal.          Assessment & Plan:  Please refer to separate problem-list charting for more details.

## 2015-01-02 NOTE — Assessment & Plan Note (Signed)
BP Readings from Last 3 Encounters:  01/02/15 135/70  12/19/14 141/68  12/06/14 127/71    Lab Results  Component Value Date   NA 139 12/19/2014   K 3.7 12/19/2014   CREATININE 0.70 12/19/2014    Assessment: Blood pressure control: controlled Progress toward BP goal:  at goal Comments: Patient states that she ran out of her Lasix and has not been taking an over the last month. Patient also states that she has been taking her lisinopril 5 mg daily. Amlodipine was recently discontinued due to lower extremity swelling. There are records back to at least 2008 indicating that the patient is on Lasix for hypertension. No other indications found upon my chart review. Patient's edema is resolved and her hypertension is well controlled.  Plan: Medications:  Continue lisinopril 5 mg daily and discontinue Lasix. Educational resources provided:   Self management tools provided:   Other plans: none

## 2015-01-02 NOTE — Assessment & Plan Note (Signed)
Patient still with intermittent persistent hip pain. -Ibuprofen refilled

## 2015-01-02 NOTE — Assessment & Plan Note (Signed)
Patient reporting chronic headaches though this could also be exacerbated by her current sinusitis. Patient is on methotrexate and patient states that she has close follow-up with neurology and may be due for a brain scan at some point soon.

## 2015-01-02 NOTE — Assessment & Plan Note (Signed)
TSH from January 2016 within normal limits at 1.4. Patient to continue current Synthroid 137 g daily.

## 2015-01-02 NOTE — Assessment & Plan Note (Signed)
Patient counseled on the importance of good sleep hygiene and Ambien was refilled.

## 2015-01-02 NOTE — Patient Instructions (Signed)
I prescribed a antibiotic on doxycycline to help with your cold symptoms, which could be caused by a bacterial sinusitis. I have also increased her metformin from 500 mg twice a day to 1000 mg twice a day. Please follow-up for an eye exam as we discussed. Also please stop taking Lasix altogether as you have over the last month. You can only take lisinopril 5 mg for now and we can check your blood pressure in the future. Your blood pressure is well controlled today. I have also written refills for your ibuprofen and your Ambien. Please follow-up in clinic in 2 months or earlier should your symptoms not resolve.  General Instructions:   Please bring your medicines with you each time you come to clinic.  Medicines may include prescription medications, over-the-counter medications, herbal remedies, eye drops, vitamins, or other pills.   Progress Toward Treatment Goals:  Treatment Goal 06/29/2014  Hemoglobin A1C at goal  Blood pressure at goal    Self Care Goals & Plans:  Self Care Goal 12/06/2014  Manage my medications take my medicines as prescribed; bring my medications to every visit; refill my medications on time; follow the sick day instructions if I am sick  Monitor my health check my feet daily  Eat healthy foods eat more vegetables; eat fruit for snacks and desserts; eat baked foods instead of fried foods; eat smaller portions; drink diet soda or water instead of juice or soda  Be physically active find an activity I enjoy    Home Blood Glucose Monitoring 03/27/2014  Check my blood sugar once a day     Care Management & Community Referrals:  No flowsheet data found.

## 2015-01-03 NOTE — Progress Notes (Signed)
Internal Medicine Clinic Attending  Case discussed with Dr. Ngo at the time of the visit.  We reviewed the resident's history and exam and pertinent patient test results.  I agree with the assessment, diagnosis, and plan of care documented in the resident's note. 

## 2015-01-10 ENCOUNTER — Other Ambulatory Visit: Payer: Self-pay | Admitting: Internal Medicine

## 2015-01-19 LAB — HM DIABETES EYE EXAM

## 2015-01-25 ENCOUNTER — Ambulatory Visit (INDEPENDENT_AMBULATORY_CARE_PROVIDER_SITE_OTHER): Payer: Medicaid Other | Admitting: Obstetrics and Gynecology

## 2015-01-25 ENCOUNTER — Encounter: Payer: Self-pay | Admitting: Obstetrics and Gynecology

## 2015-01-25 VITALS — BP 142/85 | HR 82 | Ht 68.0 in | Wt 205.9 lb

## 2015-01-25 DIAGNOSIS — Z0000 Encounter for general adult medical examination without abnormal findings: Secondary | ICD-10-CM | POA: Diagnosis not present

## 2015-01-25 DIAGNOSIS — Z01419 Encounter for gynecological examination (general) (routine) without abnormal findings: Secondary | ICD-10-CM

## 2015-01-25 DIAGNOSIS — Z118 Encounter for screening for other infectious and parasitic diseases: Secondary | ICD-10-CM

## 2015-01-25 DIAGNOSIS — Z113 Encounter for screening for infections with a predominantly sexual mode of transmission: Secondary | ICD-10-CM

## 2015-01-25 NOTE — Patient Instructions (Signed)
Preventive Care for Adults A healthy lifestyle and preventive care can promote health and wellness. Preventive health guidelines for women include the following key practices.  A routine yearly physical is a good way to check with your health care provider about your health and preventive screening. It is a chance to share any concerns and updates on your health and to receive a thorough exam.  Visit your dentist for a routine exam and preventive care every 6 months. Brush your teeth twice a day and floss once a day. Good oral hygiene prevents tooth decay and gum disease.  The frequency of eye exams is based on your age, health, family medical history, use of contact lenses, and other factors. Follow your health care provider's recommendations for frequency of eye exams.  Eat a healthy diet. Foods like vegetables, fruits, whole grains, low-fat dairy products, and lean protein foods contain the nutrients you need without too many calories. Decrease your intake of foods high in solid fats, added sugars, and salt. Eat the right amount of calories for you.Get information about a proper diet from your health care provider, if necessary.  Regular physical exercise is one of the most important things you can do for your health. Most adults should get at least 150 minutes of moderate-intensity exercise (any activity that increases your heart rate and causes you to sweat) each week. In addition, most adults need muscle-strengthening exercises on 2 or more days a week.  Maintain a healthy weight. The body mass index (BMI) is a screening tool to identify possible weight problems. It provides an estimate of body fat based on height and weight. Your health care provider can find your BMI and can help you achieve or maintain a healthy weight.For adults 20 years and older:  A BMI below 18.5 is considered underweight.  A BMI of 18.5 to 24.9 is normal.  A BMI of 25 to 29.9 is considered overweight.  A BMI of  30 and above is considered obese.  Maintain normal blood lipids and cholesterol levels by exercising and minimizing your intake of saturated fat. Eat a balanced diet with plenty of fruit and vegetables. Blood tests for lipids and cholesterol should begin at age 35 and be repeated every 5 years. If your lipid or cholesterol levels are high, you are over 50, or you are at high risk for heart disease, you may need your cholesterol levels checked more frequently.Ongoing high lipid and cholesterol levels should be treated with medicines if diet and exercise are not working.  If you smoke, find out from your health care provider how to quit. If you do not use tobacco, do not start.  Lung cancer screening is recommended for adults aged 41-80 years who are at high risk for developing lung cancer because of a history of smoking. A yearly low-dose CT scan of the lungs is recommended for people who have at least a 30-pack-year history of smoking and are a current smoker or have quit within the past 15 years. A pack year of smoking is smoking an average of 1 pack of cigarettes a day for 1 year (for example: 1 pack a day for 30 years or 2 packs a day for 15 years). Yearly screening should continue until the smoker has stopped smoking for at least 15 years. Yearly screening should be stopped for people who develop a health problem that would prevent them from having lung cancer treatment.  If you are pregnant, do not drink alcohol. If you are breastfeeding,  be very cautious about drinking alcohol. If you are not pregnant and choose to drink alcohol, do not have more than 1 drink per day. One drink is considered to be 12 ounces (355 mL) of beer, 5 ounces (148 mL) of wine, or 1.5 ounces (44 mL) of liquor.  Avoid use of street drugs. Do not share needles with anyone. Ask for help if you need support or instructions about stopping the use of drugs.  High blood pressure causes heart disease and increases the risk of  stroke. Your blood pressure should be checked at least every 1 to 2 years. Ongoing high blood pressure should be treated with medicines if weight loss and exercise do not work.  If you are 75-52 years old, ask your health care provider if you should take aspirin to prevent strokes.  Diabetes screening involves taking a blood sample to check your fasting blood sugar level. This should be done once every 3 years, after age 15, if you are within normal weight and without risk factors for diabetes. Testing should be considered at a younger age or be carried out more frequently if you are overweight and have at least 1 risk factor for diabetes.  Breast cancer screening is essential preventive care for women. You should practice "breast self-awareness." This means understanding the normal appearance and feel of your breasts and may include breast self-examination. Any changes detected, no matter how small, should be reported to a health care provider. Women in their 58s and 30s should have a clinical breast exam (CBE) by a health care provider as part of a regular health exam every 1 to 3 years. After age 16, women should have a CBE every year. Starting at age 53, women should consider having a mammogram (breast X-ray test) every year. Women who have a family history of breast cancer should talk to their health care provider about genetic screening. Women at a high risk of breast cancer should talk to their health care providers about having an MRI and a mammogram every year.  Breast cancer gene (BRCA)-related cancer risk assessment is recommended for women who have family members with BRCA-related cancers. BRCA-related cancers include breast, ovarian, tubal, and peritoneal cancers. Having family members with these cancers may be associated with an increased risk for harmful changes (mutations) in the breast cancer genes BRCA1 and BRCA2. Results of the assessment will determine the need for genetic counseling and  BRCA1 and BRCA2 testing.  Routine pelvic exams to screen for cancer are no longer recommended for nonpregnant women who are considered low risk for cancer of the pelvic organs (ovaries, uterus, and vagina) and who do not have symptoms. Ask your health care provider if a screening pelvic exam is right for you.  If you have had past treatment for cervical cancer or a condition that could lead to cancer, you need Pap tests and screening for cancer for at least 20 years after your treatment. If Pap tests have been discontinued, your risk factors (such as having a new sexual partner) need to be reassessed to determine if screening should be resumed. Some women have medical problems that increase the chance of getting cervical cancer. In these cases, your health care provider may recommend more frequent screening and Pap tests.  The HPV test is an additional test that may be used for cervical cancer screening. The HPV test looks for the virus that can cause the cell changes on the cervix. The cells collected during the Pap test can be  tested for HPV. The HPV test could be used to screen women aged 30 years and older, and should be used in women of any age who have unclear Pap test results. After the age of 30, women should have HPV testing at the same frequency as a Pap test.  Colorectal cancer can be detected and often prevented. Most routine colorectal cancer screening begins at the age of 50 years and continues through age 75 years. However, your health care provider may recommend screening at an earlier age if you have risk factors for colon cancer. On a yearly basis, your health care provider may provide home test kits to check for hidden blood in the stool. Use of a small camera at the end of a tube, to directly examine the colon (sigmoidoscopy or colonoscopy), can detect the earliest forms of colorectal cancer. Talk to your health care provider about this at age 50, when routine screening begins. Direct  exam of the colon should be repeated every 5-10 years through age 75 years, unless early forms of pre-cancerous polyps or small growths are found.  People who are at an increased risk for hepatitis B should be screened for this virus. You are considered at high risk for hepatitis B if:  You were born in a country where hepatitis B occurs often. Talk with your health care provider about which countries are considered high risk.  Your parents were born in a high-risk country and you have not received a shot to protect against hepatitis B (hepatitis B vaccine).  You have HIV or AIDS.  You use needles to inject street drugs.  You live with, or have sex with, someone who has hepatitis B.  You get hemodialysis treatment.  You take certain medicines for conditions like cancer, organ transplantation, and autoimmune conditions.  Hepatitis C blood testing is recommended for all people born from 1945 through 1965 and any individual with known risks for hepatitis C.  Practice safe sex. Use condoms and avoid high-risk sexual practices to reduce the spread of sexually transmitted infections (STIs). STIs include gonorrhea, chlamydia, syphilis, trichomonas, herpes, HPV, and human immunodeficiency virus (HIV). Herpes, HIV, and HPV are viral illnesses that have no cure. They can result in disability, cancer, and death.  You should be screened for sexually transmitted illnesses (STIs) including gonorrhea and chlamydia if:  You are sexually active and are younger than 24 years.  You are older than 24 years and your health care provider tells you that you are at risk for this type of infection.  Your sexual activity has changed since you were last screened and you are at an increased risk for chlamydia or gonorrhea. Ask your health care provider if you are at risk.  If you are at risk of being infected with HIV, it is recommended that you take a prescription medicine daily to prevent HIV infection. This is  called preexposure prophylaxis (PrEP). You are considered at risk if:  You are a heterosexual woman, are sexually active, and are at increased risk for HIV infection.  You take drugs by injection.  You are sexually active with a partner who has HIV.  Talk with your health care provider about whether you are at high risk of being infected with HIV. If you choose to begin PrEP, you should first be tested for HIV. You should then be tested every 3 months for as long as you are taking PrEP.  Osteoporosis is a disease in which the bones lose minerals and strength   with aging. This can result in serious bone fractures or breaks. The risk of osteoporosis can be identified using a bone density scan. Women ages 65 years and over and women at risk for fractures or osteoporosis should discuss screening with their health care providers. Ask your health care provider whether you should take a calcium supplement or vitamin D to reduce the rate of osteoporosis.  Menopause can be associated with physical symptoms and risks. Hormone replacement therapy is available to decrease symptoms and risks. You should talk to your health care provider about whether hormone replacement therapy is right for you.  Use sunscreen. Apply sunscreen liberally and repeatedly throughout the day. You should seek shade when your shadow is shorter than you. Protect yourself by wearing long sleeves, pants, a wide-brimmed hat, and sunglasses year round, whenever you are outdoors.  Once a month, do a whole body skin exam, using a mirror to look at the skin on your back. Tell your health care provider of new moles, moles that have irregular borders, moles that are larger than a pencil eraser, or moles that have changed in shape or color.  Stay current with required vaccines (immunizations).  Influenza vaccine. All adults should be immunized every year.  Tetanus, diphtheria, and acellular pertussis (Td, Tdap) vaccine. Pregnant women should  receive 1 dose of Tdap vaccine during each pregnancy. The dose should be obtained regardless of the length of time since the last dose. Immunization is preferred during the 27th-36th week of gestation. An adult who has not previously received Tdap or who does not know her vaccine status should receive 1 dose of Tdap. This initial dose should be followed by tetanus and diphtheria toxoids (Td) booster doses every 10 years. Adults with an unknown or incomplete history of completing a 3-dose immunization series with Td-containing vaccines should begin or complete a primary immunization series including a Tdap dose. Adults should receive a Td booster every 10 years.  Varicella vaccine. An adult without evidence of immunity to varicella should receive 2 doses or a second dose if she has previously received 1 dose. Pregnant females who do not have evidence of immunity should receive the first dose after pregnancy. This first dose should be obtained before leaving the health care facility. The second dose should be obtained 4-8 weeks after the first dose.  Human papillomavirus (HPV) vaccine. Females aged 13-26 years who have not received the vaccine previously should obtain the 3-dose series. The vaccine is not recommended for use in pregnant females. However, pregnancy testing is not needed before receiving a dose. If a female is found to be pregnant after receiving a dose, no treatment is needed. In that case, the remaining doses should be delayed until after the pregnancy. Immunization is recommended for any person with an immunocompromised condition through the age of 26 years if she did not get any or all doses earlier. During the 3-dose series, the second dose should be obtained 4-8 weeks after the first dose. The third dose should be obtained 24 weeks after the first dose and 16 weeks after the second dose.  Zoster vaccine. One dose is recommended for adults aged 60 years or older unless certain conditions are  present.  Measles, mumps, and rubella (MMR) vaccine. Adults born before 1957 generally are considered immune to measles and mumps. Adults born in 1957 or later should have 1 or more doses of MMR vaccine unless there is a contraindication to the vaccine or there is laboratory evidence of immunity to   each of the three diseases. A routine second dose of MMR vaccine should be obtained at least 28 days after the first dose for students attending postsecondary schools, health care workers, or international travelers. People who received inactivated measles vaccine or an unknown type of measles vaccine during 1963-1967 should receive 2 doses of MMR vaccine. People who received inactivated mumps vaccine or an unknown type of mumps vaccine before 1979 and are at high risk for mumps infection should consider immunization with 2 doses of MMR vaccine. For females of childbearing age, rubella immunity should be determined. If there is no evidence of immunity, females who are not pregnant should be vaccinated. If there is no evidence of immunity, females who are pregnant should delay immunization until after pregnancy. Unvaccinated health care workers born before 1957 who lack laboratory evidence of measles, mumps, or rubella immunity or laboratory confirmation of disease should consider measles and mumps immunization with 2 doses of MMR vaccine or rubella immunization with 1 dose of MMR vaccine.  Pneumococcal 13-valent conjugate (PCV13) vaccine. When indicated, a person who is uncertain of her immunization history and has no record of immunization should receive the PCV13 vaccine. An adult aged 19 years or older who has certain medical conditions and has not been previously immunized should receive 1 dose of PCV13 vaccine. This PCV13 should be followed with a dose of pneumococcal polysaccharide (PPSV23) vaccine. The PPSV23 vaccine dose should be obtained at least 8 weeks after the dose of PCV13 vaccine. An adult aged 19  years or older who has certain medical conditions and previously received 1 or more doses of PPSV23 vaccine should receive 1 dose of PCV13. The PCV13 vaccine dose should be obtained 1 or more years after the last PPSV23 vaccine dose.  Pneumococcal polysaccharide (PPSV23) vaccine. When PCV13 is also indicated, PCV13 should be obtained first. All adults aged 65 years and older should be immunized. An adult younger than age 65 years who has certain medical conditions should be immunized. Any person who resides in a nursing home or long-term care facility should be immunized. An adult smoker should be immunized. People with an immunocompromised condition and certain other conditions should receive both PCV13 and PPSV23 vaccines. People with human immunodeficiency virus (HIV) infection should be immunized as soon as possible after diagnosis. Immunization during chemotherapy or radiation therapy should be avoided. Routine use of PPSV23 vaccine is not recommended for American Indians, [place] Natives, or people younger than 65 years unless there are medical conditions that require PPSV23 vaccine. When indicated, people who have unknown immunization and have no record of immunization should receive PPSV23 vaccine. One-time revaccination 5 years after the first dose of PPSV23 is recommended for people aged 19-64 years who have chronic kidney failure, nephrotic syndrome, asplenia, or immunocompromised conditions. People who received 1-2 doses of PPSV23 before age 65 years should receive another dose of PPSV23 vaccine at age 65 years or later if at least 5 years have passed since the previous dose. Doses of PPSV23 are not needed for people immunized with PPSV23 at or after age 65 years.  Meningococcal vaccine. Adults with asplenia or persistent complement component deficiencies should receive 2 doses of quadrivalent meningococcal conjugate (MenACWY-D) vaccine. The doses should be obtained at least 2 months apart.  Microbiologists working with certain meningococcal bacteria, military recruits, people at risk during an outbreak, and people who travel to or live in countries with a high rate of meningitis should be immunized. A first-year college student up through age   21 years who is living in a residence hall should receive a dose if she did not receive a dose on or after her 16th birthday. Adults who have certain high-risk conditions should receive one or more doses of vaccine.  Hepatitis A vaccine. Adults who wish to be protected from this disease, have certain high-risk conditions, work with hepatitis A-infected animals, work in hepatitis A research labs, or travel to or work in countries with a high rate of hepatitis A should be immunized. Adults who were previously unvaccinated and who anticipate close contact with an international adoptee during the first 60 days after arrival in the [place] from a country with a high rate of hepatitis A should be immunized.  Hepatitis B vaccine. Adults who wish to be protected from this disease, have certain high-risk conditions, may be exposed to blood or other infectious body fluids, are household contacts or sex partners of hepatitis B positive people, are clients or workers in certain care facilities, or travel to or work in countries with a high rate of hepatitis B should be immunized.  Haemophilus influenzae type b (Hib) vaccine. A previously unvaccinated person with asplenia or sickle cell disease or having a scheduled splenectomy should receive 1 dose of Hib vaccine. Regardless of previous immunization, a recipient of a hematopoietic stem cell transplant should receive a 3-dose series 6-12 months after her successful transplant. Hib vaccine is not recommended for adults with HIV infection. Preventive Services / Frequency Ages 64 to 68 years  Blood pressure check.** / Every 1 to 2 years.  Lipid and cholesterol check.** / Every 5 years beginning at age  22.  Clinical breast exam.** / Every 3 years for women in their 88s and 53s.  BRCA-related cancer risk assessment.** / For women who have family members with a BRCA-related cancer (breast, ovarian, tubal, or peritoneal cancers).  Pap test.** / Every 2 years from ages 90 through 51. Every 3 years starting at age 21 through age 56 or 3 with a history of 3 consecutive normal Pap tests.  HPV screening.** / Every 3 years from ages 24 through ages 1 to 46 with a history of 3 consecutive normal Pap tests.  Hepatitis C blood test.** / For any individual with known risks for hepatitis C.  Skin self-exam. / Monthly.  Influenza vaccine. / Every year.  Tetanus, diphtheria, and acellular pertussis (Tdap, Td) vaccine.** / Consult your health care provider. Pregnant women should receive 1 dose of Tdap vaccine during each pregnancy. 1 dose of Td every 10 years.  Varicella vaccine.** / Consult your health care provider. Pregnant females who do not have evidence of immunity should receive the first dose after pregnancy.  HPV vaccine. / 3 doses over 6 months, if 72 and younger. The vaccine is not recommended for use in pregnant females. However, pregnancy testing is not needed before receiving a dose.  Measles, mumps, rubella (MMR) vaccine.** / You need at least 1 dose of MMR if you were born in 1957 or later. You may also need a 2nd dose. For females of childbearing age, rubella immunity should be determined. If there is no evidence of immunity, females who are not pregnant should be vaccinated. If there is no evidence of immunity, females who are pregnant should delay immunization until after pregnancy.  Pneumococcal 13-valent conjugate (PCV13) vaccine.** / Consult your health care provider.  Pneumococcal polysaccharide (PPSV23) vaccine.** / 1 to 2 doses if you smoke cigarettes or if you have certain conditions.  Meningococcal vaccine.** /  1 dose if you are age 76 to 72 years and a [place] living in a residence hall, or have one of several medical conditions, you need to get vaccinated against meningococcal disease. You may also need additional booster doses.  Hepatitis A vaccine.** / Consult your health care provider.  Hepatitis B vaccine.** / Consult your health care provider.  Haemophilus influenzae type b (Hib) vaccine.** / Consult your health care provider. Ages 59 to 75 years  Blood pressure check.** / Every 1 to 2 years.  Lipid and cholesterol check.** / Every 5 years beginning at age 7 years.  Lung cancer screening. / Every year if you are aged 82-80 years and have a 30-pack-year history of smoking and currently smoke or have quit within the past 15 years. Yearly screening is stopped once you have quit smoking for at least 15 years or develop a health problem that would prevent you from having lung cancer treatment.  Clinical breast exam.** / Every year after age 74 years.  BRCA-related cancer risk assessment.** / For women who have family members with a BRCA-related cancer (breast, ovarian, tubal, or peritoneal cancers).  Mammogram.** / Every year beginning at age 75 years and continuing for as long as you are in good health. Consult with your health care provider.  Pap test.** / Every 3 years starting at age 69 years through age 75 or 34 years with a history of 3 consecutive normal Pap tests.  HPV screening.** / Every 3 years from ages 70 years through ages 52 to 12 years with a history of 3 consecutive normal Pap tests.  Fecal occult blood test (FOBT) of stool. / Every year beginning at age 8 years and continuing until age 46 years. You may not need to do this test if you get a colonoscopy every 10 years.  Flexible sigmoidoscopy or colonoscopy.** / Every 5 years for a flexible sigmoidoscopy or every 10 years for a colonoscopy beginning at age 24 years and continuing until age 51 years.  Hepatitis C blood test.** / For all people born from [birth year] through  1965 and any individual with known risks for hepatitis C.  Skin self-exam. / Monthly.  Influenza vaccine. / Every year.  Tetanus, diphtheria, and acellular pertussis (Tdap/Td) vaccine.** / Consult your health care provider. Pregnant women should receive 1 dose of Tdap vaccine during each pregnancy. 1 dose of Td every 10 years.  Varicella vaccine.** / Consult your health care provider. Pregnant females who do not have evidence of immunity should receive the first dose after pregnancy.  Zoster vaccine.** / 1 dose for adults aged 8 years or older.  Measles, mumps, rubella (MMR) vaccine.** / You need at least 1 dose of MMR if you were born in 1957 or later. You may also need a 2nd dose. For females of childbearing age, rubella immunity should be determined. If there is no evidence of immunity, females who are not pregnant should be vaccinated. If there is no evidence of immunity, females who are pregnant should delay immunization until after pregnancy.  Pneumococcal 13-valent conjugate (PCV13) vaccine.** / Consult your health care provider.  Pneumococcal polysaccharide (PPSV23) vaccine.** / 1 to 2 doses if you smoke cigarettes or if you have certain conditions.  Meningococcal vaccine.** / Consult your health care provider.  Hepatitis A vaccine.** / Consult your health care provider.  Hepatitis B vaccine.** / Consult your health care provider.  Haemophilus influenzae type b (Hib) vaccine.** / Consult your health care provider. Ages 37  years and over  Blood pressure check.** / Every 1 to 2 years.  Lipid and cholesterol check.** / Every 5 years beginning at age 22 years.  Lung cancer screening. / Every year if you are aged 73-80 years and have a 30-pack-year history of smoking and currently smoke or have quit within the past 15 years. Yearly screening is stopped once you have quit smoking for at least 15 years or develop a health problem that would prevent you from having lung cancer  treatment.  Clinical breast exam.** / Every year after age 4 years.  BRCA-related cancer risk assessment.** / For women who have family members with a BRCA-related cancer (breast, ovarian, tubal, or peritoneal cancers).  Mammogram.** / Every year beginning at age 40 years and continuing for as long as you are in good health. Consult with your health care provider.  Pap test.** / Every 3 years starting at age 9 years through age 34 or 91 years with 3 consecutive normal Pap tests. Testing can be stopped between 65 and 70 years with 3 consecutive normal Pap tests and no abnormal Pap or HPV tests in the past 10 years.  HPV screening.** / Every 3 years from ages 57 years through ages 64 or 45 years with a history of 3 consecutive normal Pap tests. Testing can be stopped between 65 and 70 years with 3 consecutive normal Pap tests and no abnormal Pap or HPV tests in the past 10 years.  Fecal occult blood test (FOBT) of stool. / Every year beginning at age 15 years and continuing until age 17 years. You may not need to do this test if you get a colonoscopy every 10 years.  Flexible sigmoidoscopy or colonoscopy.** / Every 5 years for a flexible sigmoidoscopy or every 10 years for a colonoscopy beginning at age 86 years and continuing until age 71 years.  Hepatitis C blood test.** / For all people born from [birth year] through 1964 and any individual with known risks for hepatitis C.  Osteoporosis screening.** / A one-time screening for women ages 83 years and over and women at risk for fractures or osteoporosis.  Skin self-exam. / Monthly.  Influenza vaccine. / Every year.  Tetanus, diphtheria, and acellular pertussis (Tdap/Td) vaccine.** / 1 dose of Td every 10 years.  Varicella vaccine.** / Consult your health care provider.  Zoster vaccine.** / 1 dose for adults aged 61 years or older.  Pneumococcal 13-valent conjugate (PCV13) vaccine.** / Consult your health care provider.  Pneumococcal  polysaccharide (PPSV23) vaccine.** / 1 dose for all adults aged 28 years and older.  Meningococcal vaccine.** / Consult your health care provider.  Hepatitis A vaccine.** / Consult your health care provider.  Hepatitis B vaccine.** / Consult your health care provider.  Haemophilus influenzae type b (Hib) vaccine.** / Consult your health care provider. ** Family history and personal history of risk and conditions may change your health care provider's recommendations. Document Released: 01/13/2002 Document Revised: 04/03/2014 Document Reviewed: 04/14/2011 [facility] Patient Information 2015 [place], [place]. This information is not intended to replace advice given to you by your health care provider. Make sure you discuss any questions you have with your health care provider.

## 2015-01-25 NOTE — Progress Notes (Signed)
Subjective:     [name] [name] is a 49 y.o. female [phone] s/p hysterectomy in 2013 who is here for a comprehensive physical exam. The patient reports no problems.  History   Social History  . Marital Status: Married    Spouse Name: N/A  . Number of Children: 4  . Years of Education: 12   Occupational History  .     Social History Main Topics  . Smoking status: Never Smoker   . Smokeless tobacco: Never Used  . Alcohol Use: No  . Drug Use: No  . Sexual Activity: Not on file     Comment: hysterectomy   Other Topics Concern  . Not on file   Social History Narrative   Lives in [place] with 2 sons and her husband;disabled; no use of tobacco products nor alcohol.   Patient given diabetes card on 12/05/2010.   Health Maintenance  Topic Date Due  . HIV Screening  11/20/1982  . OPHTHALMOLOGY EXAM  10/24/2014  . HEMOGLOBIN A1C  03/07/2015  . INFLUENZA VACCINE  07/02/2015  . PAP SMEAR  11/12/2015  . LIPID PANEL  12/07/2015  . URINE MICROALBUMIN  12/07/2015  . FOOT EXAM  01/03/2016  . TETANUS/TDAP  10/09/2021  . PNEUMOCOCCAL POLYSACCHARIDE VACCINE  Completed   Past Medical History  Diagnosis Date  . Sarcoidosis   . OSA (obstructive sleep apnea)   . Prolonged QT interval   . Osteoporosis   . Anemia   . GERD (gastroesophageal reflux disease)   . DM (diabetes mellitus)   . Peripheral neuropathy   . HTN (hypertension)   . Nephrolithiasis   . Chronic fatigue   . Obesity   . Headache(784.0)   . Gait disturbance   . Brain tumor    Past Surgical History  Procedure Laterality Date  . Hysteroscopy    . Dilation and curettage of uterus    . Partial hysterectomy  2013  . Suboccipital craniotomy    . Thyroidectomy      Precancerous lesion  . Rotator cuff repair Right     07/2013   Family History  Problem Relation Age of Onset  . Heart disease Mother     questionable CAD and arrythmias   . Cancer Mother     Breast cancer  . Venous thrombosis Sister   .  Diabetes Sister   . Heart disease Sister   . Diabetes Brother   . Heart disease Brother   . Cancer Maternal Aunt   . Venous thrombosis Sister   . Diabetes Sister   . Heart disease Sister        Review of Systems A comprehensive review of systems was negative.   Objective:      GENERAL: Well-developed, well-nourished female in no acute distress.  HEENT: Normocephalic, atraumatic. Sclerae anicteric.  NECK: Supple. Normal thyroid.  LUNGS: Clear to auscultation bilaterally.  HEART: Regular rate and rhythm. BREASTS: Symmetric in size. No palpable masses or lymphadenopathy, skin changes, or nipple drainage. ABDOMEN: Soft, nontender, nondistended. No organomegaly. PELVIC: Normal external female genitalia. Vagina is pink and rugated.  Vaginal vault is intact. Normal discharge. No adnexal mass or tenderness. EXTREMITIES: No cyanosis, clubbing, or edema, 2+ distal pulses.    Assessment:    Healthy female exam.      Plan:    No need for pap smears Screening mammogram is due October 2016 Patient desires full STD testing. Cultures and blood work ordered Patient will be contacted with any abnormal results See After  Visit Summary for Counseling Recommendations

## 2015-01-26 LAB — GC/CHLAMYDIA PROBE AMP
CT Probe RNA: NEGATIVE
GC Probe RNA: NEGATIVE

## 2015-01-26 LAB — RPR

## 2015-01-26 LAB — HEPATITIS B SURFACE ANTIGEN: HEP B S AG: NEGATIVE

## 2015-01-26 LAB — HEPATITIS C ANTIBODY: HCV Ab: NEGATIVE

## 2015-01-26 LAB — HIV ANTIBODY (ROUTINE TESTING W REFLEX): HIV 1&2 Ab, 4th Generation: NONREACTIVE

## 2015-01-29 ENCOUNTER — Telehealth: Payer: Self-pay | Admitting: Internal Medicine

## 2015-01-29 NOTE — Telephone Encounter (Signed)
LOV 01/19/2015 from [facility]-exam being faxed

## 2015-01-30 ENCOUNTER — Encounter: Payer: Self-pay | Admitting: *Deleted

## 2015-02-06 ENCOUNTER — Other Ambulatory Visit: Payer: Self-pay | Admitting: Internal Medicine

## 2015-02-07 NOTE — Progress Notes (Signed)
Zolpidem was ordered during visit but never called in.  I called in today with 4 refills.

## 2015-03-08 ENCOUNTER — Telehealth: Payer: Self-pay | Admitting: *Deleted

## 2015-03-08 NOTE — Telephone Encounter (Signed)
Carolin Sicks Duty sent to [facility] and Dr Jannifer Franklin 03-08-15.

## 2015-03-09 ENCOUNTER — Telehealth: Payer: Self-pay | Admitting: *Deleted

## 2015-03-09 NOTE — Telephone Encounter (Signed)
Letter [employer] received from [place] an Dr Jannifer Franklin at front desk for patient 03-09-15.

## 2015-03-09 NOTE — Telephone Encounter (Signed)
Letter signed and returned to [place].

## 2015-03-16 ENCOUNTER — Other Ambulatory Visit: Payer: Self-pay | Admitting: Internal Medicine

## 2015-04-10 ENCOUNTER — Encounter: Payer: Self-pay | Admitting: *Deleted

## 2015-04-15 ENCOUNTER — Other Ambulatory Visit: Payer: Self-pay | Admitting: Internal Medicine

## 2015-04-28 ENCOUNTER — Other Ambulatory Visit: Payer: Self-pay | Admitting: Neurology

## 2015-05-09 ENCOUNTER — Telehealth: Payer: Self-pay | Admitting: Internal Medicine

## 2015-05-09 NOTE — Telephone Encounter (Signed)
Call to patient to confirm appointment for 05/10/15 at 1:45 lmtcb

## 2015-05-10 ENCOUNTER — Ambulatory Visit (INDEPENDENT_AMBULATORY_CARE_PROVIDER_SITE_OTHER): Payer: Medicaid Other | Admitting: Internal Medicine

## 2015-05-10 ENCOUNTER — Encounter: Payer: Self-pay | Admitting: Internal Medicine

## 2015-05-10 VITALS — BP 139/73 | HR 80 | Temp 98.1°F | Ht 68.0 in | Wt 205.8 lb

## 2015-05-10 DIAGNOSIS — R109 Unspecified abdominal pain: Secondary | ICD-10-CM | POA: Diagnosis not present

## 2015-05-10 DIAGNOSIS — I10 Essential (primary) hypertension: Secondary | ICD-10-CM

## 2015-05-10 DIAGNOSIS — E119 Type 2 diabetes mellitus without complications: Secondary | ICD-10-CM

## 2015-05-10 DIAGNOSIS — R110 Nausea: Secondary | ICD-10-CM

## 2015-05-10 DIAGNOSIS — R1084 Generalized abdominal pain: Secondary | ICD-10-CM

## 2015-05-10 LAB — POCT GLYCOSYLATED HEMOGLOBIN (HGB A1C): HEMOGLOBIN A1C: 6.3

## 2015-05-10 LAB — GLUCOSE, CAPILLARY: GLUCOSE-CAPILLARY: 108 mg/dL — AB (ref 65–99)

## 2015-05-10 MED ORDER — PROMETHAZINE HCL 12.5 MG PO TABS: 12.5000 mg | ORAL_TABLET | Freq: Four times a day (QID) | ORAL | Status: DC | PRN

## 2015-05-10 NOTE — Progress Notes (Signed)
   Subjective:    Patient ID: [name] [name], female    DOB: Oct 23, 1966, 49 y.o.   MRN: [id number]  HPI  Patient is a 49 year old with history of hypertension, type 2 diabetes, hypothyroidism, chronic left hip pain, who presents to clinic for routine follow-up.  Please see problem based charting for more details.  Review of Systems  Constitutional: Negative for fever and chills.  HENT: Negative for rhinorrhea and sore throat.   Eyes: Negative for visual disturbance.  Respiratory: Negative for cough and shortness of breath.   Cardiovascular: Negative for chest pain and palpitations.  Gastrointestinal: Positive for nausea and constipation. Negative for vomiting, abdominal pain, diarrhea and blood in stool.  Genitourinary: Negative for dysuria and hematuria.  Neurological: Negative for syncope.       Objective:   Physical Exam  Constitutional: She is oriented to person, place, and time. She appears well-developed and well-nourished. No distress.  HENT:  Head: Normocephalic and atraumatic.  Eyes: EOM are normal. Pupils are equal, round, and reactive to light.  Neck: Normal range of motion. Neck supple. No thyromegaly present.  Cardiovascular: Normal rate and regular rhythm.  Exam reveals no gallop and no friction rub.   No murmur heard. Pulmonary/Chest: Effort normal and breath sounds normal. No respiratory distress. She has no wheezes. She has no rales.  Abdominal: Soft. Bowel sounds are normal. She exhibits no distension. There is no tenderness. There is no rebound.  Musculoskeletal: She exhibits no edema.  Neurological: She is alert and oriented to person, place, and time. No cranial nerve deficit.  Skin: Skin is warm and dry. No rash noted.  Psychiatric: She has a normal mood and affect. Thought content normal.          Assessment & Plan:  Please see problem based charting for more details.

## 2015-05-10 NOTE — Progress Notes (Signed)
Internal Medicine Clinic Attending  Case discussed with Dr. Ngo at the time of the visit.  We reviewed the resident's history and exam and pertinent patient test results.  I agree with the assessment, diagnosis, and plan of care documented in the resident's note. 

## 2015-05-10 NOTE — Assessment & Plan Note (Signed)
Lab Results  Component Value Date   HGBA1C 6.3 05/10/2015   HGBA1C 7.6 12/06/2014   HGBA1C 6.9 06/29/2014     Assessment: Diabetes control: good control (HgbA1C at goal) Progress toward A1C goal:  at goal Comments: Patient has been compliant with her metformin 1000 mg twice a day. Recently up titrated from 500 mg twice a day.  Plan: Medications:  continue current medications Home glucose monitoring: Frequency:   Timing:   Instruction/counseling given: reminded to get eye exam Other plans: none

## 2015-05-10 NOTE — Assessment & Plan Note (Addendum)
Patient continuing to follow-up with Dr. Benson Norway of GI. Patient receiving monthly bowel cleanse regimens reportedly. No records in our system still. Possible discussion of colectomy at some point in the future. Working diagnosis from GI standpoint is unclear given lack of records. -We will obtain records from Dr. Benson Norway -Refilled Phenergan for nausea

## 2015-05-10 NOTE — Patient Instructions (Signed)
Continue doing a great job in managing your blood pressure and diabetes. I have prescribed a medication called Phenergan to help with her nausea.  General Instructions:   Please bring your medicines with you each time you come to clinic.  Medicines may include prescription medications, over-the-counter medications, herbal remedies, eye drops, vitamins, or other pills.   Progress Toward Treatment Goals:  Treatment Goal 05/10/2015  Hemoglobin A1C at goal  Blood pressure at goal    Self Care Goals & Plans:  Self Care Goal 01/02/2015  Manage my medications take my medicines as prescribed  Monitor my health -  Eat healthy foods -  Be physically active -    Home Blood Glucose Monitoring 03/27/2014  Check my blood sugar once a day     Care Management & Community Referrals:  No flowsheet data found.

## 2015-05-10 NOTE — Assessment & Plan Note (Signed)
BP Readings from Last 3 Encounters:  05/10/15 139/73  01/25/15 142/85  01/02/15 135/70    Lab Results  Component Value Date   NA 139 12/19/2014   K 3.7 12/19/2014   CREATININE 0.70 12/19/2014    Assessment: Blood pressure control: controlled Progress toward BP goal:  at goal Comments: Patient has been compliant with her lisinopril 5 mg daily.  Plan: Medications:  continue current medications Educational resources provided: brochure (denies) Self management tools provided:   Other plans: none

## 2015-05-16 ENCOUNTER — Encounter: Payer: Self-pay | Admitting: Internal Medicine

## 2015-05-16 ENCOUNTER — Ambulatory Visit (INDEPENDENT_AMBULATORY_CARE_PROVIDER_SITE_OTHER): Payer: Medicaid Other | Admitting: Internal Medicine

## 2015-05-16 VITALS — BP 130/73 | HR 70 | Temp 98.2°F | Ht 68.0 in | Wt 204.0 lb

## 2015-05-16 DIAGNOSIS — G8929 Other chronic pain: Secondary | ICD-10-CM | POA: Diagnosis not present

## 2015-05-16 DIAGNOSIS — M25552 Pain in left hip: Secondary | ICD-10-CM | POA: Diagnosis not present

## 2015-05-16 MED ORDER — KETOROLAC TROMETHAMINE 30 MG/ML IM SOLN: 30.0000 mg | Freq: Once | INTRAMUSCULAR | Status: DC

## 2015-05-16 MED ORDER — KETOROLAC TROMETHAMINE 30 MG/ML IJ SOLN
30.0000 mg | Freq: Once | INTRAMUSCULAR | Status: AC
  Administered 2015-05-16: 30 mg via INTRAMUSCULAR

## 2015-05-16 MED ORDER — HYDROCODONE-ACETAMINOPHEN 5-325 MG PO TABS: 1.0000 | ORAL_TABLET | Freq: Two times a day (BID) | ORAL | Status: DC | PRN

## 2015-05-16 NOTE — Patient Instructions (Signed)
General Instructions: Please take Norco 5 mg every 12 hrs as needed for pain Please use heating pad or ice compression on your back  Follow up in 2 weeks  Please bring your medicines with you each time you come to clinic.  Medicines may include prescription medications, over-the-counter medications, herbal remedies, eye drops, vitamins, or other pills. Back Pain, Adult Back pain is very common. The pain often gets better over time. The cause of back pain is usually not dangerous. Most people can learn to manage their back pain on their own.  HOME CARE   Stay active. Start with short walks on flat ground if you can. Try to walk farther each day.  Do not sit, drive, or stand in one place for more than 30 minutes. Do not stay in bed.  Do not avoid exercise or work. Activity can help your back heal faster.  Be careful when you bend or lift an object. Bend at your knees, keep the object close to you, and do not twist.  Sleep on a firm mattress. Lie on your side, and bend your knees. If you lie on your back, put a pillow under your knees.  Only take medicines as told by your doctor.  Put ice on the injured area.  Put ice in a plastic bag.  Place a towel between your skin and the bag.  Leave the ice on for 15-20 minutes, 03-04 times a day for the first 2 to 3 days. After that, you can switch between ice and heat packs.  Ask your doctor about back exercises or massage.  Avoid feeling anxious or stressed. Find good ways to deal with stress, such as exercise. GET HELP RIGHT AWAY IF:   Your pain does not go away with rest or medicine.  Your pain does not go away in 1 week.  You have new problems.  You do not feel well.  The pain spreads into your legs.  You cannot control when you poop (bowel movement) or pee (urinate).  Your arms or legs feel weak or lose feeling (numbness).  You feel sick to your stomach (nauseous) or throw up (vomit).  You have belly (abdominal) pain.  You  feel like you may pass out (faint). MAKE SURE YOU:   Understand these instructions.  Will watch your condition.  Will get help right away if you are not doing well or get worse. Document Released: 05/05/2008 Document Revised: 02/09/2012 Document Reviewed: 03/21/2014 [facility] Patient Information 2015 [place], [place]. This information is not intended to replace advice given to you by your health care provider. Make sure you discuss any questions you have with your health care provider.    Progress Toward Treatment Goals:  Treatment Goal 05/10/2015  Hemoglobin A1C at goal  Blood pressure at goal    Self Care Goals & Plans:  Self Care Goal 05/16/2015  Manage my medications take my medicines as prescribed; bring my medications to every visit; refill my medications on time; follow the sick day instructions if I am sick  Monitor my health check my feet daily  Eat healthy foods eat more vegetables; eat fruit for snacks and desserts; eat baked foods instead of fried foods; eat smaller portions; drink diet soda or water instead of juice or soda  Be physically active find an activity I enjoy    Home Blood Glucose Monitoring 03/27/2014  Check my blood sugar once a day     Care Management & Community Referrals:  No flowsheet data found.

## 2015-05-17 NOTE — Progress Notes (Signed)
Patient ID: [name] [name], female   DOB: 11/02/1966, 49 y.o.   MRN: [id number]   Subjective:   HPI: [name] [name] is a 49 y.o. woman with past medical history below presents for an acute visit.   Left hip pain and low back pain: Patient states that she had a fall last week when she stepped on an uneven surface. She fell on the left side of her body and had bruises on the left knee. Since the fall, she has been experiencing increased pain in the left hip. She rates her pain as 6/10, sharp, radiating down to the left leg, increased with walking, and particularly very disturbing during nighttime. She has been able to ambulate with minor difficulties otherwise. Pain is associated with low back pain since the fall. She does not have neurological symptoms. She has tried ibuprofen 1200 mg without much relief. She does not take any other pain medications. Of note, patient has chronic bilateral hip pain and prior DEXA scan in 2014 revealed bilateral hip osteopenia. No history of hip fractures. Previously, she has requested injection with Toradol which produces pain relief sustainably for several weeks/months. She is requesting for IM Toradol again..  Past Medical History  Diagnosis Date  . Sarcoidosis   . OSA (obstructive sleep apnea)   . Prolonged QT interval   . Osteoporosis   . Anemia   . GERD (gastroesophageal reflux disease)   . DM (diabetes mellitus)   . Peripheral neuropathy   . HTN (hypertension)   . Nephrolithiasis   . Chronic fatigue   . Obesity   . Headache(784.0)   . Gait disturbance   . Brain tumor     ROS: Constitutional:  Denies fevers, chills, diaphoresis, appetite change and fatigue.  Respiratory: Denies SOB, DOE, cough, chest tightness, and wheezing.  CVS: No chest pain, palpitations and leg swelling.  GI: No abdominal pain, nausea, vomiting, bloody stools GU: No dysuria, frequency, hematuria, or flank pain.  Psych: No depression symptoms. No SI or SA.     Objective:  Physical Exam: Filed Vitals:   05/16/15 1350  BP: 130/73  Pulse: 70  Temp: 98.2 F (36.8 C)  TempSrc: Oral  Height: 5\' 8"  (1.727 m)  Weight: 204 lb (92.534 kg)  SpO2: 97%   General: Well nourished. No acute distress.  HEENT: Normal oral mucosa. MMM.  Lungs: CTA bilaterally. No wheezing. Heart: RRR; no extra sounds or murmurs  Abdomen: Non-distended, normal bowel sounds, soft, nontender; no hepatosplenomegaly  Extremities: No pedal edema. No joint swelling or tenderness. She does have reproducible tenderness on extreme external rotation of the left leg. Straight leg raising sign is positive on the left side. Right lower extremity exam is normal. Back: Mild tenderness in the low back lumbar region Neurologic: Normal EOM,  Alert and oriented x3. No obvious neurologic/cranial nerve deficits.  Assessment & Plan:  Discussed case with Dr Daryll Drown  See problem based charting for assessment and plan.

## 2015-05-17 NOTE — Assessment & Plan Note (Addendum)
There is acute worsening of her chronic left hip pain since the fall a week ago. She has been seen by SM in the past.  States that voltaren gel and Ibuprofen have worked in the past, but these have not been very effective since recent fall. Her exam reveals tenderness on extreme external rotation of the left hip. She is able to ambulate. I do not suspect fractures. She is requesting for IM Toradol. Plan -IM Toradol 30 mg -Norco 5-325mg  every 8-12 hours as needed for pain #30pills. This is intended for very short to use in the setting of her acute pain. Encouraged her to continue using ibuprofen as well for breakthrough pain. -Continue with Voltaren gel as well. Also recommended heating compression, ice. If the pain persists for more than a few weeks, will consider referral to physical therapy. -Imaging is not indicated at this point. However, this can be considered if her pain persists. She will follow-up in 2 weeks for reevaluation.

## 2015-05-22 ENCOUNTER — Ambulatory Visit (INDEPENDENT_AMBULATORY_CARE_PROVIDER_SITE_OTHER): Payer: Medicaid Other | Admitting: Adult Health

## 2015-05-22 ENCOUNTER — Encounter: Payer: Self-pay | Admitting: Adult Health

## 2015-05-22 VITALS — BP 138/85 | HR 81 | Ht 68.0 in | Wt 205.0 lb

## 2015-05-22 DIAGNOSIS — R42 Dizziness and giddiness: Secondary | ICD-10-CM | POA: Diagnosis not present

## 2015-05-22 DIAGNOSIS — R51 Headache: Secondary | ICD-10-CM | POA: Diagnosis not present

## 2015-05-22 DIAGNOSIS — Z5181 Encounter for therapeutic drug level monitoring: Secondary | ICD-10-CM | POA: Diagnosis not present

## 2015-05-22 DIAGNOSIS — R519 Headache, unspecified: Secondary | ICD-10-CM

## 2015-05-22 DIAGNOSIS — D8689 Sarcoidosis of other sites: Secondary | ICD-10-CM | POA: Diagnosis not present

## 2015-05-22 NOTE — Progress Notes (Signed)
[facility] Medicine Clinic Attending  Case discussed with Dr. Kazibwe soon after the resident saw the patient.  We reviewed the resident's history and exam and pertinent patient test results.  I agree with the assessment, diagnosis, and plan of care documented in the resident's note. 
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
## 2015-05-22 NOTE — Patient Instructions (Signed)
We will repeat MRI of the brain.  If symptoms worsen let us know.

## 2015-05-22 NOTE — Progress Notes (Signed)
I have read the note, and I agree with the clinical assessment and plan.  Tremel Setters KEITH   

## 2015-05-23 ENCOUNTER — Telehealth: Payer: Self-pay

## 2015-05-23 LAB — COMPREHENSIVE METABOLIC PANEL
A/G RATIO: 1.5 (ref 1.1–2.5)
ALT: 21 IU/L (ref 0–32)
AST: 17 IU/L (ref 0–40)
Albumin: 3.9 g/dL (ref 3.5–5.5)
Alkaline Phosphatase: 73 IU/L (ref 39–117)
BUN/Creatinine Ratio: 19 (ref 9–23)
BUN: 14 mg/dL (ref 6–24)
Bilirubin Total: 0.2 mg/dL (ref 0.0–1.2)
CO2: 25 mmol/L (ref 18–29)
CREATININE: 0.75 mg/dL (ref 0.57–1.00)
Calcium: 9 mg/dL (ref 8.7–10.2)
Chloride: 103 mmol/L (ref 97–108)
GFR calc Af Amer: 110 mL/min/{1.73_m2} (ref >59)
GFR, EST NON AFRICAN AMERICAN: 95 mL/min/{1.73_m2} (ref >59)
Globulin, Total: 2.6 g/dL (ref 1.5–4.5)
Glucose: 104 mg/dL — ABNORMAL HIGH (ref 65–99)
Potassium: 5 mmol/L (ref 3.5–5.2)
Sodium: 143 mmol/L (ref 134–144)
Total Protein: 6.5 g/dL (ref 6.0–8.5)

## 2015-05-23 LAB — CBC WITH DIFFERENTIAL/PLATELET
Basophils Absolute: 0 10*3/uL (ref 0.0–0.2)
Basos: 0 %
EOS (ABSOLUTE): 0.1 10*3/uL (ref 0.0–0.4)
EOS: 3 %
HEMATOCRIT: 37.7 % (ref 34.0–46.6)
Hemoglobin: 11.8 g/dL (ref 11.1–15.9)
Immature Grans (Abs): 0 10*3/uL (ref 0.0–0.1)
Immature Granulocytes: 0 %
Lymphocytes Absolute: 1.9 10*3/uL (ref 0.7–3.1)
Lymphs: 39 %
MCH: 27.2 pg (ref 26.6–33.0)
MCHC: 31.3 g/dL — ABNORMAL LOW (ref 31.5–35.7)
MCV: 87 fL (ref 79–97)
MONOS ABS: 0.4 10*3/uL (ref 0.1–0.9)
Monocytes: 8 %
NEUTROS PCT: 50 %
Neutrophils Absolute: 2.4 10*3/uL (ref 1.4–7.0)
Platelets: 284 10*3/uL (ref 150–379)
RBC: 4.34 x10E6/uL (ref 3.77–5.28)
RDW: 15.2 % (ref 12.3–15.4)
WBC: 4.8 10*3/uL (ref 3.4–10.8)

## 2015-05-23 NOTE — Telephone Encounter (Signed)
Called and spoke to patient relayed normal labs. Patient understood.

## 2015-05-23 NOTE — Telephone Encounter (Signed)
-----   Message from Ward Givens, NP sent at 05/23/2015  7:28 AM EDT ----- Lab work is normal. Please call patient.

## 2015-05-30 ENCOUNTER — Ambulatory Visit (INDEPENDENT_AMBULATORY_CARE_PROVIDER_SITE_OTHER): Payer: Medicaid Other | Admitting: Internal Medicine

## 2015-05-30 ENCOUNTER — Encounter: Payer: Self-pay | Admitting: Internal Medicine

## 2015-05-30 VITALS — BP 143/75 | HR 74 | Temp 98.1°F | Ht 68.0 in | Wt 203.9 lb

## 2015-05-30 DIAGNOSIS — M25552 Pain in left hip: Secondary | ICD-10-CM

## 2015-05-30 DIAGNOSIS — G8929 Other chronic pain: Secondary | ICD-10-CM | POA: Diagnosis present

## 2015-05-30 MED ORDER — KETOROLAC TROMETHAMINE 60 MG/2ML IM SOLN
60.0000 mg | Freq: Once | INTRAMUSCULAR | Status: AC
  Administered 2015-05-30: 60 mg via INTRAMUSCULAR

## 2015-05-30 NOTE — Progress Notes (Signed)
INTERNAL MEDICINE TEACHING ATTENDING ADDENDUM - Jaxson Keener, MD: I reviewed and discussed at the time of visit with the resident Dr. Hoffman, the patient's medical history, physical examination, diagnosis and results of pertinent tests and treatment and I agree with the patient's care as documented.  
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
## 2015-05-30 NOTE — Assessment & Plan Note (Addendum)
-   Given the acute nature of this pain and it was after a fall I will go ahead and get a Xray of her pelvis, I think that this may just represent an exacerbation of her chronic pain but it would be important to rule out a fracture (especially since she has a history of osteoporosis). - I will place a referral back to sports medicine given the chronic nature of the the pain as I feel that she may benefit from alternative modalities and obviously is not responding greatly to NSAIDs. -She does request another Toradol injection which I will provide today.

## 2015-05-30 NOTE — Patient Instructions (Signed)
General Instructions:  I will get an xray of your hip, I have placed a referral to sports medicine.  Please bring your medicines with you each time you come to clinic.  Medicines may include prescription medications, over-the-counter medications, herbal remedies, eye drops, vitamins, or other pills.   Progress Toward Treatment Goals:  Treatment Goal 05/10/2015  Hemoglobin A1C at goal  Blood pressure at goal    Self Care Goals & Plans:  Self Care Goal 05/16/2015  Manage my medications take my medicines as prescribed; bring my medications to every visit; refill my medications on time; follow the sick day instructions if I am sick  Monitor my health check my feet daily  Eat healthy foods eat more vegetables; eat fruit for snacks and desserts; eat baked foods instead of fried foods; eat smaller portions; drink diet soda or water instead of juice or soda  Be physically active find an activity I enjoy    Home Blood Glucose Monitoring 03/27/2014  Check my blood sugar once a day     Care Management & Community Referrals:  No flowsheet data found.

## 2015-06-01 ENCOUNTER — Other Ambulatory Visit: Payer: Self-pay | Admitting: Internal Medicine

## 2015-06-01 ENCOUNTER — Ambulatory Visit (HOSPITAL_COMMUNITY)
Admission: RE | Admit: 2015-06-01 | Discharge: 2015-06-01 | Disposition: A | Discharge status: Home / Self Care | Payer: Medicaid Other | Source: Ambulatory Visit | Attending: Internal Medicine | Admitting: Internal Medicine

## 2015-06-01 DIAGNOSIS — M25552 Pain in left hip: Secondary | ICD-10-CM | POA: Insufficient documentation

## 2015-06-01 DIAGNOSIS — G8929 Other chronic pain: Secondary | ICD-10-CM | POA: Insufficient documentation

## 2015-06-08 ENCOUNTER — Telehealth: Payer: Self-pay | Admitting: Neurology

## 2015-06-08 NOTE — Telephone Encounter (Signed)
Patient called and states that she was supposed to have an MRI conducted but has not been contacted about this. Please call and advise.

## 2015-06-18 ENCOUNTER — Encounter: Payer: Self-pay | Admitting: Sports Medicine

## 2015-06-18 ENCOUNTER — Ambulatory Visit (INDEPENDENT_AMBULATORY_CARE_PROVIDER_SITE_OTHER): Payer: Medicaid Other | Admitting: Sports Medicine

## 2015-06-18 ENCOUNTER — Other Ambulatory Visit: Payer: Self-pay | Admitting: Internal Medicine

## 2015-06-18 VITALS — BP 151/77 | Ht 68.0 in | Wt 197.0 lb

## 2015-06-18 DIAGNOSIS — M25552 Pain in left hip: Secondary | ICD-10-CM

## 2015-06-18 DIAGNOSIS — M25562 Pain in left knee: Secondary | ICD-10-CM | POA: Diagnosis present

## 2015-06-18 MED ORDER — NAPROXEN 500 MG PO TABS: ORAL_TABLET | ORAL | Status: DC

## 2015-06-18 NOTE — Progress Notes (Signed)
   Subjective:    Patient ID: [name] [name], female    DOB: 1966-08-13, 50 y.o.   MRN: [id number]  HPI chief complaint: Left knee and left hip pain  49 year old female comes in today complaining of 3 weeks of left knee and left hip pain. She fell landing directly on the left knee. She had immediate swelling throughout the knee. Swelling has improved but not resolved. She localizes all of her pain to the anterior knee. Her hip pain is all lateral. No groin pain. She has had problems with greater trochanteric bursitis in the past. She does have some numbness and tingling in the knee as well. She's been taking over-the-counter ibuprofen with minimal symptom improvement. No imaging has been done.  Interim medical history reviewed Medications reviewed Allergies reviewed    Review of Systems    as above Objective:   Physical Exam  Well-developed, well-nourished. No acute distress. Awake alert and oriented 3.  Left hip: Smooth painless range of motion with a negative logroll. Diffuse tenderness to palpation along the lateral hip. No soft tissue swelling  Left knee: Full range of motion. There is moderate soft tissue swelling over the anterior knee. Diffuse tenderness to palpation especially at the tibial tubercle. Extensor mechanism is intact. No obvious effusion. Negative anterior drawer, negative posterior drawer. Negative McMurray's. Knee is stable to valgus and varus stressing. Neurovascular intact distally. Walking with a slight limp.      Assessment & Plan:  Left hip pain likely secondary to posttraumatic greater trochanteric bursitis Left knee pain likely secondary to contusion  X-rays of both the left hip and the left knee. I will call her with those results once available. I will start her on naproxen sodium 500 mg twice daily with food for 7 days then as needed. Ace wrap for compression. We'll delineate further treatment based on her x-ray findings.

## 2015-06-19 ENCOUNTER — Other Ambulatory Visit: Payer: Self-pay | Admitting: Sports Medicine

## 2015-06-19 ENCOUNTER — Ambulatory Visit
Admission: RE | Admit: 2015-06-19 | Discharge: 2015-06-19 | Disposition: A | Discharge status: Home / Self Care | Payer: Medicaid Other | Source: Ambulatory Visit | Attending: Sports Medicine | Admitting: Sports Medicine

## 2015-06-19 DIAGNOSIS — M25552 Pain in left hip: Secondary | ICD-10-CM

## 2015-06-19 DIAGNOSIS — M25562 Pain in left knee: Secondary | ICD-10-CM

## 2015-06-21 ENCOUNTER — Telehealth: Payer: Self-pay | Admitting: Sports Medicine

## 2015-06-21 NOTE — Telephone Encounter (Signed)
I spoke with the patient on the phone today after reviewing x-rays of her left hip and left knee. No fracture is seen. I recommended she continue with compression for her left knee and continue on her oral anti-inflammatory medicine. I've reassured her that her knee pain should subside over the next few weeks. If her left hip pain persists we could send her back to physical therapy. Follow-up with me in 3 weeks if symptoms persist.

## 2015-06-22 NOTE — Telephone Encounter (Signed)
Pt called / informed of refill. 

## 2015-07-02 ENCOUNTER — Encounter: Payer: Self-pay | Admitting: Internal Medicine

## 2015-07-02 ENCOUNTER — Ambulatory Visit (INDEPENDENT_AMBULATORY_CARE_PROVIDER_SITE_OTHER): Payer: Medicaid Other | Admitting: Internal Medicine

## 2015-07-02 VITALS — BP 152/78 | HR 72 | Temp 98.2°F | Wt 206.9 lb

## 2015-07-02 DIAGNOSIS — M25552 Pain in left hip: Secondary | ICD-10-CM

## 2015-07-02 DIAGNOSIS — J029 Acute pharyngitis, unspecified: Secondary | ICD-10-CM | POA: Diagnosis not present

## 2015-07-02 DIAGNOSIS — G8929 Other chronic pain: Secondary | ICD-10-CM

## 2015-07-02 DIAGNOSIS — G4700 Insomnia, unspecified: Secondary | ICD-10-CM | POA: Diagnosis not present

## 2015-07-02 DIAGNOSIS — I10 Essential (primary) hypertension: Secondary | ICD-10-CM | POA: Diagnosis not present

## 2015-07-02 DIAGNOSIS — R058 Other specified cough: Secondary | ICD-10-CM

## 2015-07-02 DIAGNOSIS — R05 Cough: Secondary | ICD-10-CM

## 2015-07-02 MED ORDER — LISINOPRIL 10 MG PO TABS: 10.0000 mg | ORAL_TABLET | Freq: Every day | ORAL | Status: DC

## 2015-07-02 NOTE — Assessment & Plan Note (Signed)
Cough for 2 weeks, with associated sore throat and chest pain for 1.5 weeks. Cough is nonproductive. She denies any shortness of breath. No fever, sinus pressure, or GI symptoms present. Most likely a viral URI either now resolving or resolved with remaining post-infectious cough causing pain due to prolonged irritation.  - Recommend OTC antitussive PRN - Confirm symptom resolution by next appointment

## 2015-07-02 NOTE — Progress Notes (Signed)
Patient ID: [name] [name], female   DOB: 02-07-1966, 49 y.o.   MRN: [id number]   Subjective:   Patient ID: [name] [name] female   DOB: 03/12/1966 49 y.o.   MRN: [id number]  HPI: [name] is a 49 y.o. with a history of neurosarcoidosis, T2DM, hypothyroidism, HTN, GERD, osteoporosis, HLD, migraines, and insomnia presenting for follow up of her left knee and hip pain present this month also a new cough for the past 2 weeks. Her leg pain is secondary to a fall onto her left knee. She was seen in sports medicine clinic with no bone fracture on plain film of the knee. Currently taking naproxen 500mg  for antiinflammatory and analgesic treatment, and pain is improving. She is able to walk and has full ROM of the leg. Her cough started 2 weeks ago with sore throat and chest pain starting 1.5 weeks ago. This cough is nonproductive. Her throat and chest pain are worsened after prolonged coughing. She denies any shortness of breath, lightheadedness, or persistence of chest pain without coughing. No fever, nausea, diarrhea, or constipation.   Past Medical History  Diagnosis Date  . Sarcoidosis   . OSA (obstructive sleep apnea)   . Prolonged QT interval   . Osteoporosis   . Anemia   . GERD (gastroesophageal reflux disease)   . DM (diabetes mellitus)   . Peripheral neuropathy   . HTN (hypertension)   . Nephrolithiasis   . Chronic fatigue   . Obesity   . Headache(784.0)   . Gait disturbance   . Brain tumor    Current Outpatient Prescriptions  Medication Sig Dispense Refill  . aspirin 81 MG tablet Take 81 mg by mouth at bedtime.     Marland Kitchen atorvastatin (LIPITOR) 20 MG tablet Take 1 tablet (20 mg total) by mouth every evening. 31 tablet 6  . diclofenac sodium (VOLTAREN) 1 % GEL Apply 2 g topically 4 (four) times daily. 1 Tube 0  . esomeprazole (NEXIUM) 40 MG capsule TAKE ONE CAPSULE BY MOUTH EVERY DAY 60 capsule 1  . HYDROcodone-acetaminophen (NORCO/VICODIN) 5-325 MG per tablet Take 1  tablet by mouth every 12 (twelve) hours as needed for moderate pain. 30 tablet 0  . ibuprofen (ADVIL,MOTRIN) 600 MG tablet Take 1 tablet (600 mg total) by mouth every 8 (eight) hours as needed. 90 tablet 1  . levothyroxine (SYNTHROID, LEVOTHROID) 137 MCG tablet TAKE 1 TABLET BY MOUTH EVERY DAY BEFORE BREAKFAST 60 tablet 2  . lisinopril (PRINIVIL,ZESTRIL) 10 MG tablet Take 1 tablet (10 mg total) by mouth daily. 60 tablet 5  . metFORMIN (GLUCOPHAGE) 500 MG tablet Take 2 tablets (1,000 mg total) by mouth 2 (two) times daily with a meal. 240 tablet 3  . Multiple Vitamin (MULTIVITAMIN WITH MINERALS) TABS tablet Take 1 tablet by mouth every evening.     . naproxen (NAPROSYN) 500 MG tablet Take one pill with food twice a day for 7 days then prn 60 tablet 0  . Parenteral Therapy Supplies (WINTHROP [place]) [place] See admin instructions.  0  . promethazine (PHENERGAN) 12.5 MG tablet Take 1 tablet (12.5 mg total) by mouth every 6 (six) hours as needed for nausea or vomiting. 30 tablet 0  . RHEUMATREX 2.5 MG tablet TAKE 4 TABLETS (10 MG TOTAL) BY MOUTH ONCE A WEEK. 48 tablet 0  . zolpidem (AMBIEN) 5 MG tablet Take 1 tablet (5 mg total) by mouth at bedtime as needed for sleep. 30 tablet 5   No current facility-administered medications  for this visit.   Family History  Problem Relation Age of Onset  . Heart disease Mother     questionable CAD and arrythmias   . Cancer Mother     Breast cancer  . Venous thrombosis Sister   . Diabetes Sister   . Heart disease Sister   . Diabetes Brother   . Heart disease Brother   . Cancer Maternal Aunt   . Venous thrombosis Sister   . Diabetes Sister   . Heart disease Sister    History   Social History  . Marital Status: Married    Spouse Name: N/A  . Number of Children: 4  . Years of Education: 12   Occupational History  .     Social History Main Topics  . Smoking status: Never Smoker   . Smokeless tobacco: Never Used  . Alcohol Use: No  . Drug  Use: No  . Sexual Activity: Not on file     Comment: hysterectomy   Other Topics Concern  . None   Social History Narrative   Lives in [place] with 2 sons and her husband;disabled; no use of tobacco products nor alcohol.   Patient given diabetes card on 12/05/2010.   Review of Systems: Review of Systems  Constitutional: Negative for fever and chills.  Eyes: Negative for blurred vision and double vision.  Respiratory: Positive for cough. Negative for sputum production, shortness of breath and wheezing.   Cardiovascular: Positive for chest pain. Negative for palpitations and leg swelling.  Gastrointestinal: Negative for nausea, vomiting, diarrhea and constipation.  Genitourinary: Negative for dysuria.  Musculoskeletal: Positive for myalgias and joint pain.  Skin: Negative for rash.  Neurological: Positive for headaches. Negative for dizziness and focal weakness.    Objective:  Physical Exam: Filed Vitals:   07/02/15 1343  BP: 152/78  Pulse: 72  Temp: 98.2 F (36.8 C)  TempSrc: Oral  Weight: 206 lb 14.4 oz (93.849 kg)  SpO2: 99%   GENERAL- alert, co-operative, NAD HEENT- Wearing wig, no tenderness to sinus or neck palpation, oropharynx erythematous without exudate, no cervical lymphadenopathy, moist oral mucosa CARDIAC- RRR, no murmurs, rubs or gallops. RESP- CTAB, no wheezes or crackles. ABDOMEN- Soft, nontender, no guarding or rebound EXTREMITIES- No pedal edema, prominence of left tibial tuberosity, tender to palpation superior and inferior of left knee, without pain on ROM, no effusion SKIN- Warm, dry, no rash or lesion  Assessment & Plan:

## 2015-07-02 NOTE — Patient Instructions (Signed)
Continue to take naproxen as previously prescribed for your hip and knee pain. Take this medicine with meals to minimize stomach irritation.  Increase lisinopril to 10mg  daily for blood pressure control.  If cough remains a problem try OTC cough medicine such as robitussin, nyquil, or lozenges to reduce chest irritation until your cough resolves.  Follow up with our clinic at 8 weeks for blood pressure recheck, flu vaccination. If your symptoms worsen significantly before that time please call sooner.

## 2015-07-02 NOTE — Assessment & Plan Note (Signed)
Taking ambien only some nights, does not require it to sleep all the time.  - Continue ambien 5mg  qHS as needed, will discuss frequency required at next follow up visit

## 2015-07-02 NOTE — Assessment & Plan Note (Signed)
BP Readings from Last 3 Encounter: 07/02/15: 152/78 05/10/15: 139/73 01/25/15: 142/85  Blood pressure not at goal, worsened from previous values. Currently on only 5mg  lisinopril qdaily. Was previously controlled on amlodipine 10mg  and lasix 40mg . Leg swelling was a side effect of amlodipine.  ACE-I therapy highly desirable in patient with T2DM and HTN.  - Increase lisinopril to 10mg , continue to titrate as appropriate  - F/U at 8 weeks

## 2015-07-03 NOTE — Progress Notes (Signed)
Internal Medicine Clinic Attending  I saw and evaluated the patient.  I personally confirmed the key portions of the history and exam documented by Dr. Rice and I reviewed pertinent patient test results.  The assessment, diagnosis, and plan were formulated together and I agree with the documentation in the resident's note.  

## 2015-07-20 ENCOUNTER — Encounter: Payer: Medicaid Other | Admitting: Internal Medicine

## 2015-08-04 ENCOUNTER — Other Ambulatory Visit: Payer: Self-pay | Admitting: Neurology

## 2015-08-04 NOTE — Telephone Encounter (Signed)
Received phone call from patient about her Rx Rheumatrex, I ordered 2.5mg  4 tab once a week, 48 tabs, no refill.

## 2015-09-04 ENCOUNTER — Other Ambulatory Visit: Payer: Self-pay

## 2015-09-04 ENCOUNTER — Telehealth: Payer: Self-pay | Admitting: Internal Medicine

## 2015-09-04 ENCOUNTER — Telehealth: Payer: Self-pay | Admitting: Neurology

## 2015-09-04 DIAGNOSIS — G8929 Other chronic pain: Secondary | ICD-10-CM

## 2015-09-04 DIAGNOSIS — Z1231 Encounter for screening mammogram for malignant neoplasm of breast: Secondary | ICD-10-CM

## 2015-09-04 DIAGNOSIS — M25552 Pain in left hip: Principal | ICD-10-CM

## 2015-09-04 DIAGNOSIS — Z803 Family history of malignant neoplasm of breast: Secondary | ICD-10-CM

## 2015-09-04 MED ORDER — HYDROCODONE-ACETAMINOPHEN 5-325 MG PO TABS: 1.0000 | ORAL_TABLET | Freq: Four times a day (QID) | ORAL | Status: DC | PRN

## 2015-09-04 MED ORDER — HYDROCODONE-ACETAMINOPHEN 5-325 MG PO TABS: 1.0000 | ORAL_TABLET | Freq: Two times a day (BID) | ORAL | Status: DC | PRN

## 2015-09-04 NOTE — Telephone Encounter (Signed)
Patient returned your call about medication for her headache.  Please call.  Thanks!

## 2015-09-04 NOTE — Telephone Encounter (Signed)
Pt called and would like something for a headache. She has had headache for almost two days. Please call and advise [phone]

## 2015-09-04 NOTE — Telephone Encounter (Signed)
I called back again.  Spoke with patient.  Says she's had a headache for the past 2 days.  The only medication she has tried is Tylenol Extra Strength, and it has not been beneficial.  She has not taken any NSAID's; indicated she does not have any of this medication on hand.  Would like a message sent to the provider asking if something can be prescribed/recommended.  Please advise.  Thank you.

## 2015-09-04 NOTE — Telephone Encounter (Signed)
Pt stated she is having bad headache and would like to know what she should do. Please call back.

## 2015-09-04 NOTE — Telephone Encounter (Signed)
I called the patient. She has had a headache for 2-1/2 days. She states the headache is located in the frontal region and down in the shoulders and the base of the neck. She does have photophobia. Denies phonophobia. She does have nausea but no vomiting. In the past she has been given hydrocodone and this relieves her discomfort. I have offered to do a Toradol injection for the patient however she states that she's had this in the past without any benefit. I am amendable to giving the patient hydrocodone however I will only give her 10 tablets to help with her acute headache.

## 2015-09-04 NOTE — Telephone Encounter (Signed)
I called the patient back to verify if she has taken anything for headaches, OTC or Rx.  Got no answer.  Left message.

## 2015-09-04 NOTE — Telephone Encounter (Signed)
Talked with pt - brain tumor is coming back. Doing chemo.  C/O of h/a. Sees Dr Jannifer Franklin - needs pain contract. When h/a are bad she takes Vicodin. Pt wants PCP to give pain med. Has appt 09/07/15. Appt made for 09/05/15 8:45AM with Dr Shawnie Pons. Prefers not to go to ER. If any change suggest ER. Hilda Blades Jaclyne Haverstick RN 09/04/15 4:10PM

## 2015-09-05 ENCOUNTER — Ambulatory Visit: Payer: Medicaid Other | Admitting: Internal Medicine

## 2015-09-06 ENCOUNTER — Encounter: Payer: Self-pay | Admitting: Internal Medicine

## 2015-09-07 ENCOUNTER — Encounter: Payer: Self-pay | Admitting: Internal Medicine

## 2015-09-07 ENCOUNTER — Ambulatory Visit (INDEPENDENT_AMBULATORY_CARE_PROVIDER_SITE_OTHER): Payer: Medicaid Other | Admitting: Internal Medicine

## 2015-09-07 VITALS — BP 134/68 | HR 74 | Temp 98.2°F | Wt 207.8 lb

## 2015-09-07 DIAGNOSIS — Z5181 Encounter for therapeutic drug level monitoring: Secondary | ICD-10-CM | POA: Diagnosis not present

## 2015-09-07 DIAGNOSIS — D8689 Sarcoidosis of other sites: Secondary | ICD-10-CM | POA: Diagnosis not present

## 2015-09-07 DIAGNOSIS — Z23 Encounter for immunization: Secondary | ICD-10-CM

## 2015-09-07 DIAGNOSIS — I10 Essential (primary) hypertension: Secondary | ICD-10-CM | POA: Diagnosis not present

## 2015-09-07 DIAGNOSIS — Z0000 Encounter for general adult medical examination without abnormal findings: Secondary | ICD-10-CM

## 2015-09-07 MED ORDER — METFORMIN HCL 1000 MG PO TABS: 1000.0000 mg | ORAL_TABLET | Freq: Two times a day (BID) | ORAL | Status: DC

## 2015-09-07 MED ORDER — FOLIC ACID 1 MG PO TABS: 1.0000 mg | ORAL_TABLET | Freq: Every day | ORAL | Status: DC

## 2015-09-07 MED ORDER — LOSARTAN POTASSIUM 50 MG PO TABS: 50.0000 mg | ORAL_TABLET | Freq: Every day | ORAL | Status: DC

## 2015-09-07 NOTE — Assessment & Plan Note (Signed)
Recurrent headaches resistant to most therapies likely due to neurosarcoidosis. Continues to take methotrexate as directed. On review, patient has already had opiate pain medication prescribed to pharmacy for this and we will not duplicate this now. She would really benefit from getting into Neuro clinic to establish pain contract if this is now a primary treatment option for these headaches - Staff will call on 10/10 to schedule Neurology Clinic appt

## 2015-09-07 NOTE — Assessment & Plan Note (Signed)
BP Readings from Last 3 Encounters:  09/07/15 134/68  07/02/15 152/78  06/18/15 151/77    Lab Results  Component Value Date   NA 143 05/22/2015   K 5.0 05/22/2015   CREATININE 0.75 05/22/2015    Assessment: Blood pressure control: At goal Progress toward BP goal:  Good progress Comments: Increase in lisinopril seems to have a beneficial effect. Patient also with small weight loss. However now has dry cough worst at night likely 2/2 ACE-I  Plan: Medications:  Replace lisinopril with losartan 50mg  Educational resources provided:   Self management tools provided: Pt has BP cuff Other plans: F/U at approximately 1 month, for medication tolerance, BP control

## 2015-09-07 NOTE — Progress Notes (Signed)
Subjective:   Patient ID: [name] [name] female   DOB: [birth year] 49 y.o.   MRN: [id number]  HPI: [name] is a 49 y.o. with a history of neurosarcoidosis, T2DM, hypothyroidism, HTN, GERD, osteoporosis, HLD, migraines, and insomnia presenting for worsening headache for the past 4 days. This headache is unilateral on the right frontal area but bilateral across the occipital area. There is associated photophobia, but no nausea, vomiting, tinnitus, or dizziness. Pain was not significantly relived with tylenol. She called her neurology clinic that has been managing her chronic headache and was given a prescription for hydrocodone 5mg . However she was also instructed that a pain contract will be required for continued opiate prescribing but has been unable to arrange an appointment since May of this year.  See problem based assessment and plan below for additional details.  Past Medical History  Diagnosis Date  . Sarcoidosis ([place])   . OSA (obstructive sleep apnea)   . Prolonged QT interval   . Osteoporosis   . Anemia   . GERD (gastroesophageal reflux disease)   . DM (diabetes mellitus) ([place])   . Peripheral neuropathy ([place])   . HTN (hypertension)   . Nephrolithiasis   . Chronic fatigue   . Obesity   . Headache(784.0)   . Gait disturbance   . Brain tumor [facility])    Current Outpatient Prescriptions  Medication Sig Dispense Refill  . aspirin 81 MG tablet Take 81 mg by mouth at bedtime.     Marland Kitchen atorvastatin (LIPITOR) 20 MG tablet Take 1 tablet (20 mg total) by mouth every evening. 31 tablet 6  . diclofenac sodium (VOLTAREN) 1 % GEL Apply 2 g topically 4 (four) times daily. 1 Tube 0  . esomeprazole (NEXIUM) 40 MG capsule TAKE ONE CAPSULE BY MOUTH EVERY DAY 60 capsule 1  . folic acid (FOLVITE) 1 MG tablet Take 1 tablet (1 mg total) by mouth daily. 100 tablet 3  . HYDROcodone-acetaminophen (NORCO/VICODIN) 5-325 MG tablet Take 1 tablet by mouth every 6 (six) hours as needed for  moderate pain. For headache 10 tablet 0  . ibuprofen (ADVIL,MOTRIN) 600 MG tablet Take 1 tablet (600 mg total) by mouth every 8 (eight) hours as needed. 90 tablet 1  . levothyroxine (SYNTHROID, LEVOTHROID) 137 MCG tablet TAKE 1 TABLET BY MOUTH EVERY DAY BEFORE BREAKFAST 60 tablet 2  . losartan (COZAAR) 50 MG tablet Take 1 tablet (50 mg total) by mouth daily. 30 tablet 11  . metFORMIN (GLUCOPHAGE) 1000 MG tablet Take 1 tablet (1,000 mg total) by mouth 2 (two) times daily with a meal. 120 tablet 2  . Multiple Vitamin (MULTIVITAMIN WITH MINERALS) TABS tablet Take 1 tablet by mouth every evening.     . naproxen (NAPROSYN) 500 MG tablet Take one pill with food twice a day for 7 days then prn 60 tablet 0  . Parenteral Therapy Supplies (WINTHROP [place]) [place] See admin instructions.  0  . promethazine (PHENERGAN) 12.5 MG tablet Take 1 tablet (12.5 mg total) by mouth every 6 (six) hours as needed for nausea or vomiting. 30 tablet 0  . RHEUMATREX 2.5 MG tablet TAKE 4 TABLETS (10 MG TOTAL) BY MOUTH ONCE A WEEK. 48 tablet 0  . zolpidem (AMBIEN) 5 MG tablet Take 1 tablet (5 mg total) by mouth at bedtime as needed for sleep. 30 tablet 5   No current facility-administered medications for this visit.   Family History  Problem Relation Age of Onset  . Heart disease Mother  questionable CAD and arrythmias   . Cancer Mother     Breast cancer  . Venous thrombosis Sister   . Diabetes Sister   . Heart disease Sister   . Diabetes Brother   . Heart disease Brother   . Cancer Maternal Aunt   . Venous thrombosis Sister   . Diabetes Sister   . Heart disease Sister    Social History   Social History  . Marital Status: Married    Spouse Name: N/A  . Number of Children: 4  . Years of Education: 12   Occupational History  .     Social History Main Topics  . Smoking status: Never Smoker   . Smokeless tobacco: Never Used  . Alcohol Use: No  . Drug Use: No  . Sexual Activity: Not Asked      Comment: hysterectomy   Other Topics Concern  . None   Social History Narrative   Lives in [place] with 2 sons and her husband;disabled; no use of tobacco products nor alcohol.   Patient given diabetes card on 12/05/2010.   Review of Systems: Review of Systems  Constitutional: Negative for fever.  HENT: Negative for hearing loss.   Eyes: Positive for photophobia.  Respiratory: Negative for cough.   Cardiovascular: Negative for chest pain.  Gastrointestinal: Negative for nausea and vomiting.  Skin: Negative for rash.  Neurological: Positive for headaches. Negative for dizziness.   Objective:  Physical Exam: Filed Vitals:   09/07/15 1357  BP: 134/68  Pulse: 74  Temp: 98.2 F (36.8 C)  TempSrc: Oral  Weight: 207 lb 12.8 oz (94.257 kg)  SpO2: 100%   GENERAL- alert, co-operative, NAD HEENT- Wearing a wig, PERRL,EOMI, oropharynx nonerythematous, no cervical LN enlargement. CARDIAC- RRR, no murmurs, rubs or gallops. RESP- CTAB, no wheezes or crackles. NEURO- No obvious Cr N abnormality SKIN- Warm, dry, No rash or lesion. PSYCH- Normal mood and affect, appropriate thought content and speech.   Assessment & Plan:

## 2015-09-07 NOTE — Assessment & Plan Note (Signed)
-  Influenza vaccine given today -Prevnar vaccine given today

## 2015-09-07 NOTE — Patient Instructions (Addendum)
Today we made the following changes to your medications:  Stop taking lisinopril 10mg . Start taking 50mg  losartan (Cozaar). Start taking folic acid (leukovorin) once daily.  We will be calling your Neurologist on Monday to arrange an appointment as soon as possible. It is best if the headache specialist is managing your pain medication for headache.  We are also scheduling you for pulmonary function test. You will be called about this appointment information.  In the meantime I will provide a short course of pain medications, take up to 1 per 6 hrs as needed for headache relief.

## 2015-09-07 NOTE — Assessment & Plan Note (Signed)
Low overall dose of methotrexate, 10mg  qweekly, however still may benefit from folate supplementation. LFTs should be monitored q60months during therapy. PFTs would be warranted as on chart review she does not have documented values. - Start folic acid 1mg  tablet daily - LFTs today - Schedule for future PFTs, nonurgent

## 2015-09-08 LAB — HEPATIC FUNCTION PANEL
ALBUMIN: 4.3 g/dL (ref 3.5–5.5)
ALT: 22 IU/L (ref 0–32)
AST: 16 IU/L (ref 0–40)
Alkaline Phosphatase: 69 IU/L (ref 39–117)
Bilirubin Total: 0.3 mg/dL (ref 0.0–1.2)
Bilirubin, Direct: 0.08 mg/dL (ref 0.00–0.40)
Total Protein: 7.2 g/dL (ref 6.0–8.5)

## 2015-09-10 ENCOUNTER — Telehealth: Payer: Self-pay

## 2015-09-10 ENCOUNTER — Other Ambulatory Visit: Payer: Self-pay | Admitting: Internal Medicine

## 2015-09-10 NOTE — Telephone Encounter (Signed)
Advised patient MRI order system, ready for initial scheduling. Gave GI phone # to contact and proceed.

## 2015-09-14 NOTE — Progress Notes (Signed)
Internal Medicine Clinic Attending  I saw and evaluated the patient.  I personally confirmed the key portions of the history and exam documented by Dr. Rice and I reviewed pertinent patient test results.  The assessment, diagnosis, and plan were formulated together and I agree with the documentation in the resident's note.  

## 2015-09-17 ENCOUNTER — Ambulatory Visit (HOSPITAL_COMMUNITY)
Admission: RE | Admit: 2015-09-17 | Discharge: 2015-09-17 | Disposition: A | Discharge status: Home / Self Care | Payer: Medicaid Other | Source: Ambulatory Visit | Attending: Student in an Organized Health Care Education/Training Program | Admitting: Student in an Organized Health Care Education/Training Program

## 2015-09-17 DIAGNOSIS — R0609 Other forms of dyspnea: Secondary | ICD-10-CM | POA: Diagnosis not present

## 2015-09-17 DIAGNOSIS — R05 Cough: Secondary | ICD-10-CM | POA: Diagnosis not present

## 2015-09-17 DIAGNOSIS — D8689 Sarcoidosis of other sites: Secondary | ICD-10-CM

## 2015-09-17 LAB — PULMONARY FUNCTION TEST
DL/VA % pred: 90 %
DL/VA: 4.75 ml/min/mmHg/L
DLCO UNC: 21.91 ml/min/mmHg
DLCO unc % pred: 73 %
FEF 25-75 Pre: 2.86 L/sec
FEF2575-%Pred-Pre: 101 %
FEV1-%Pred-Pre: 96 %
FEV1-Pre: 2.64 L
FEV1FVC-%PRED-PRE: 99 %
FEV6-%Pred-Pre: 97 %
FEV6-PRE: 3.23 L
FEV6FVC-%Pred-Pre: 103 %
FVC-%PRED-PRE: 94 %
FVC-Pre: 3.23 L
PRE FEV6/FVC RATIO: 100 %
Pre FEV1/FVC ratio: 82 %
RV % pred: 103 %
RV: 2 L
TLC % PRED: 91 %
TLC: 5.19 L

## 2015-09-24 ENCOUNTER — Ambulatory Visit
Admission: RE | Admit: 2015-09-24 | Discharge: 2015-09-24 | Disposition: A | Discharge status: Home / Self Care | Payer: Medicaid Other | Source: Ambulatory Visit

## 2015-09-24 DIAGNOSIS — Z803 Family history of malignant neoplasm of breast: Secondary | ICD-10-CM

## 2015-09-24 DIAGNOSIS — Z1231 Encounter for screening mammogram for malignant neoplasm of breast: Secondary | ICD-10-CM

## 2015-09-27 ENCOUNTER — Ambulatory Visit
Admission: RE | Admit: 2015-09-27 | Discharge: 2015-09-27 | Disposition: A | Discharge status: Home / Self Care | Payer: Medicaid Other | Source: Ambulatory Visit | Attending: Adult Health | Admitting: Adult Health

## 2015-09-27 DIAGNOSIS — D8689 Sarcoidosis of other sites: Secondary | ICD-10-CM

## 2015-09-27 DIAGNOSIS — R51 Headache: Secondary | ICD-10-CM | POA: Diagnosis not present

## 2015-09-27 DIAGNOSIS — R42 Dizziness and giddiness: Secondary | ICD-10-CM

## 2015-09-27 DIAGNOSIS — R519 Headache, unspecified: Secondary | ICD-10-CM

## 2015-09-27 MED ORDER — GADOBENATE DIMEGLUMINE 529 MG/ML IV SOLN
19.0000 mL | Freq: Once | INTRAVENOUS | Status: AC | PRN
  Administered 2015-09-27: 19 mL via INTRAVENOUS

## 2015-10-01 ENCOUNTER — Telehealth: Payer: Self-pay

## 2015-10-01 NOTE — Telephone Encounter (Signed)
Pt called returning Kristen's call. This operator relayed to pt MRI was stable per skype from [place]

## 2015-10-01 NOTE — Telephone Encounter (Signed)
Advise patient that I am willing to refill her hydrocodone with 30 tablets. However she will need to sign a narcotic contract with our office. I have reviewed this with her before. Please let me know what she decides.

## 2015-10-01 NOTE — Telephone Encounter (Signed)
Called pt to tell her that her MRI was stable when compared to previous scans. No answer, left a message asking her to call me back.

## 2015-10-01 NOTE — Telephone Encounter (Signed)
Called pt back to ask her if she has any questions regarding her MRI being stable when compared to previous scans. Pt denies any questions regarding her MRI.  Pt is requesting more hydrocodone-acetaminophen for her headaches. Pt was only given 10 tablets on 09/04/15, and she has since used them all up and has "been just dealing with my headaches".  She states that she was not given a pain contract through our office because she was told that she doesn't "call all the time asking for more pain medication".  I advised pt that I would ask Jinny Blossom, NP if she would be amenable to giving pt more pain medications. Pt verbalized understanding.

## 2015-10-01 NOTE — Telephone Encounter (Signed)
-----   Message from Ward Givens, NP sent at 10/01/2015  7:56 AM EDT ----- MRI stable when compared to previous scans. Please call patient. Thanks.

## 2015-10-01 NOTE — Telephone Encounter (Signed)
I called the patient. I explained Megan's message to her. She will come in to sign a narcotic agreement.

## 2015-10-02 ENCOUNTER — Telehealth: Payer: Self-pay | Admitting: Adult Health

## 2015-10-02 DIAGNOSIS — G8929 Other chronic pain: Secondary | ICD-10-CM

## 2015-10-02 DIAGNOSIS — M25552 Pain in left hip: Principal | ICD-10-CM

## 2015-10-02 MED ORDER — HYDROCODONE-ACETAMINOPHEN 5-325 MG PO TABS: 1.0000 | ORAL_TABLET | Freq: Four times a day (QID) | ORAL | Status: DC | PRN

## 2015-10-02 NOTE — Telephone Encounter (Signed)
The patient's headaches have increased in the month of October. She states that in the past she will have periods where her headaches will increase and she can take hydrocodone and will received good benefit. Her headaches will typically resolve and she may not have a recurrent headache for several weeks or even months. I will provide the patient with a prescription for hydrocodone for 30 tablets. She did sign a narcotic agreement with our office. Patient advised that if her headaches do not improve she should let us know. Patient verbalized understanding.

## 2015-10-05 ENCOUNTER — Encounter: Payer: Self-pay | Admitting: Internal Medicine

## 2015-10-05 ENCOUNTER — Ambulatory Visit (INDEPENDENT_AMBULATORY_CARE_PROVIDER_SITE_OTHER): Payer: Medicaid Other | Admitting: Internal Medicine

## 2015-10-05 VITALS — BP 137/66 | HR 78 | Temp 98.1°F | Wt 208.7 lb

## 2015-10-05 DIAGNOSIS — G8929 Other chronic pain: Secondary | ICD-10-CM | POA: Diagnosis not present

## 2015-10-05 DIAGNOSIS — D8689 Sarcoidosis of other sites: Secondary | ICD-10-CM | POA: Diagnosis not present

## 2015-10-05 DIAGNOSIS — Z7984 Long term (current) use of oral hypoglycemic drugs: Secondary | ICD-10-CM | POA: Diagnosis not present

## 2015-10-05 DIAGNOSIS — M7062 Trochanteric bursitis, left hip: Secondary | ICD-10-CM

## 2015-10-05 DIAGNOSIS — M25552 Pain in left hip: Secondary | ICD-10-CM | POA: Diagnosis not present

## 2015-10-05 DIAGNOSIS — E119 Type 2 diabetes mellitus without complications: Secondary | ICD-10-CM

## 2015-10-05 DIAGNOSIS — I10 Essential (primary) hypertension: Secondary | ICD-10-CM | POA: Diagnosis present

## 2015-10-05 DIAGNOSIS — Z79899 Other long term (current) drug therapy: Secondary | ICD-10-CM | POA: Diagnosis not present

## 2015-10-05 LAB — POCT GLYCOSYLATED HEMOGLOBIN (HGB A1C): Hemoglobin A1C: 7.1

## 2015-10-05 LAB — GLUCOSE, CAPILLARY: GLUCOSE-CAPILLARY: 152 mg/dL — AB (ref 65–99)

## 2015-10-05 MED ORDER — KETOROLAC TROMETHAMINE 60 MG/2ML IM SOLN: 60.0000 mg | Freq: Once | INTRAMUSCULAR | Status: DC

## 2015-10-05 MED ORDER — KETOROLAC TROMETHAMINE 30 MG/ML IM SOLN
30.0000 mg | Freq: Once | INTRAMUSCULAR | Status: AC
  Administered 2015-10-05: 30 mg via INTRAMUSCULAR

## 2015-10-05 NOTE — Assessment & Plan Note (Signed)
Most recent MRI shows stable disease, follows with neurology as well and her headaches are currently well-controlled. She is on MTX currently. -Will obtain CBC for MTX monitoring at next visit.

## 2015-10-05 NOTE — Assessment & Plan Note (Signed)
Lab Results  Component Value Date   HGBA1C 7.1 10/05/2015   HGBA1C 6.3 05/10/2015   HGBA1C 7.6 12/06/2014     Assessment: Diabetes control:  well-controlled Progress toward A1C goal:   near goal Comments: on metformin 1000mg  BID  Plan: Medications:  continue current medications Home glucose monitoring: Frequency:   Timing:   Instruction/counseling given: reminded to bring medications to each visit, discussed foot care, discussed the need for weight loss and discussed diet Educational resources provided:   Self management tools provided:   Other plans:

## 2015-10-05 NOTE — Assessment & Plan Note (Signed)
BP Readings from Last 3 Encounters:  10/05/15 137/66  09/07/15 134/68  07/02/15 152/78    Lab Results  Component Value Date   NA 143 05/22/2015   K 5.0 05/22/2015   CREATININE 0.75 05/22/2015    Assessment: Blood pressure control:  well-controlled Progress toward BP goal:   at goal Comments: On losartan 50mg  QD  Plan: Medications:  continue current medications Educational resources provided:   Self management tools provided:   Other plans:

## 2015-10-05 NOTE — Progress Notes (Signed)
   Patient ID: [name] [name] female   DOB: 11/04/1966 49 y.o.   MRN: [id number]  Subjective:   HPI: [name] is a 49 y.o. with PMH of neurosarcoidosis, T2DM, hypothyroidism, HTN, GERD, migraines, and L trochanteric pain syndrome who presents to [facility] today for routine follow-up. She says that her only new issue currently is that her chronic L hip pain has started to recur. She says it is sharp in nature, worst when she is lying down for bed. It occasionally radiates the her back and buttocks. She is already on opioids for her occasional headaches but she says that these don't help her hip pain and that toradol injections in the past have relieved it. She denies fever, changes in weight, chest pain, SOB, abdominal pain, changes in urinary or bowel function, rashes, or any focal numbness or weakness.  Please see problem-based charting for further pertinent information.  Review of Systems: Pertinent items noted in HPI and remainder of comprehensive ROS otherwise negative.  Objective:  Physical Exam: Filed Vitals:   10/05/15 1412  BP: 137/66  Pulse: 78  Temp: 98.1 F (36.7 C)  TempSrc: Oral  Weight: 208 lb 11.2 oz (94.666 kg)  SpO2: 100%   Gen: Well-appearing, alert and oriented to person, place, and time HEENT: Oropharynx clear without erythema or exudate.  Neck: No cervical LAD, no thyromegaly or nodules, no JVD noted. CV: Normal rate, regular rhythm, no murmurs, rubs, or gallops Pulmonary: Normal effort, CTA bilaterally, no wheezing, rales, or rhonchi Abdominal: Soft, non-tender, non-distended, without rebound, guarding, or masses Extremities: Distal pulses 2+ in upper and lower extremities bilaterally, no erythema or edema. There is mild tenderness to palpation over the L hip. ROM intact. Normal neurovascular exam. Skin: No atypical appearing moles. No rashes  Assessment & Plan:  Please see problem-based charting for assessment and plan.  Blane Ohara, MD Resident  Physician, PGY-1 Department of Internal Medicine [employer]

## 2015-10-05 NOTE — Assessment & Plan Note (Addendum)
Patient says that she has had a recurrence of her characteristic hip pain that is responsive to toradol injections. She is already on a pain contract with her neurologists. -Toradol 60mg  IM x1 today

## 2015-10-07 NOTE — Progress Notes (Signed)
Internal Medicine Clinic Attending  I saw and evaluated the patient.  I personally confirmed the key portions of the history and exam documented by Dr. Kennedy and I reviewed pertinent patient test results.  The assessment, diagnosis, and plan were formulated together and I agree with the documentation in the resident's note.  

## 2015-10-08 NOTE — Progress Notes (Signed)
Toradol 60 mg (2 30 mg vials used) total given; only 30 mg charted.

## 2015-10-24 ENCOUNTER — Other Ambulatory Visit: Payer: Self-pay | Admitting: Neurology

## 2015-11-01 NOTE — Progress Notes (Signed)
HPI: FU long history of atypical chest pain, sarcoidosis, diabetes, and previously diagnosed long QT. Her last Myoview was performed in May of 2012. At that time, she was found to have an ejection fraction of 49% with a small apical defect most likely from soft tissue attenuation. Small prior infarct could not be excluded. Echocardiogram in July 2005 showed normal LV function. Stress echocardiogram April 2014 normal. I last saw her in April 2014 Since then She denies dyspnea on exertion, orthopnea or PND. Occasional mild pedal edema. She continues to have chest pain. It can be sharp or like something sitting on her chest. It last 1-2 days at a time. It increases with certain movements. She does not have exertional chest pain. No syncope.  Current Outpatient Prescriptions  Medication Sig Dispense Refill  . aspirin 81 MG tablet Take 81 mg by mouth at bedtime.     Marland Kitchen atorvastatin (LIPITOR) 20 MG tablet TAKE 1 TABLET (20 MG TOTAL) BY MOUTH EVERY EVENING. 31 tablet 6  . diclofenac sodium (VOLTAREN) 1 % GEL Apply 2 g topically 4 (four) times daily. 1 Tube 0  . folic acid (FOLVITE) 1 MG tablet Take 1 tablet (1 mg total) by mouth daily. 100 tablet 3  . levothyroxine (SYNTHROID, LEVOTHROID) 137 MCG tablet TAKE 1 TABLET BY MOUTH EVERY DAY BEFORE BREAKFAST 60 tablet 2  . metFORMIN (GLUCOPHAGE) 1000 MG tablet Take 1 tablet (1,000 mg total) by mouth 2 (two) times daily with a meal. 120 tablet 2  . Multiple Vitamin (MULTIVITAMIN WITH MINERALS) TABS tablet Take 1 tablet by mouth every evening.     . Parenteral Therapy Supplies ([facility]THROP [place]) [place] See admin instructions.  0  . promethazine (PHENERGAN) 12.5 MG tablet Take 1 tablet (12.5 mg total) by mouth every 6 (six) hours as needed for nausea or vomiting. 30 tablet 0  . RHEUMATREX 2.5 MG tablet TAKE 4 TABLETS (10 MG TOTAL) BY MOUTH ONCE A WEEK. 48 tablet 0  . zolpidem (AMBIEN) 5 MG tablet Take 1 tablet (5 mg total) by mouth at bedtime as needed  for sleep. 30 tablet 5   No current facility-administered medications for this visit.     Past Medical History  Diagnosis Date  . Sarcoidosis ([place])   . OSA (obstructive sleep apnea)   . Prolonged QT interval   . Osteoporosis   . Anemia   . GERD (gastroesophageal reflux disease)   . DM (diabetes mellitus) ([place])   . Peripheral neuropathy ([place])   . HTN (hypertension)   . Nephrolithiasis   . Chronic fatigue   . Obesity   . Headache(784.0)   . Gait disturbance   . Brain tumor [facility])     Past Surgical History  Procedure Laterality Date  . Hysteroscopy    . Dilation and curettage of uterus    . Partial hysterectomy  2013  . Suboccipital craniotomy    . Thyroidectomy      Precancerous lesion  . Rotator cuff repair Right     07/2013    Social History   Social History  . Marital Status: Married    Spouse Name: N/A  . Number of Children: 4  . Years of Education: 12   Occupational History  .     Social History Main Topics  . Smoking status: Never Smoker   . Smokeless tobacco: Never Used  . Alcohol Use: No  . Drug Use: No  . Sexual Activity: Not on file  Comment: hysterectomy   Other Topics Concern  . Not on file   Social History Narrative   Lives in [place] with 2 sons and her husband;disabled; no use of tobacco products nor alcohol.   Patient given diabetes card on 12/05/2010.    ROS: no fevers or chills, productive cough, hemoptysis, dysphasia, odynophagia, melena, hematochezia, dysuria, hematuria, rash, seizure activity, orthopnea, PND, pedal edema, claudication. Remaining systems are negative.  Physical Exam: Well-developed well-nourished in no acute distress.  Skin is warm and dry.  HEENT is normal.  Neck is supple.  Chest is clear to auscultation with normal expansion.  Cardiovascular exam is regular rate and rhythm.  Abdominal exam nontender or distended. No masses palpated. Extremities show no edema. neuro grossly intact  ECG Sinus rhythm  with short PR interval, normal axis, nonspecific ST changes.

## 2015-11-05 ENCOUNTER — Ambulatory Visit (INDEPENDENT_AMBULATORY_CARE_PROVIDER_SITE_OTHER): Payer: Medicaid Other | Admitting: Cardiology

## 2015-11-05 ENCOUNTER — Encounter: Payer: Self-pay | Admitting: Cardiology

## 2015-11-05 VITALS — BP 130/90 | HR 89 | Ht 68.0 in | Wt 207.0 lb

## 2015-11-05 DIAGNOSIS — R072 Precordial pain: Secondary | ICD-10-CM | POA: Diagnosis not present

## 2015-11-05 DIAGNOSIS — I10 Essential (primary) hypertension: Secondary | ICD-10-CM | POA: Diagnosis not present

## 2015-11-05 DIAGNOSIS — Z01818 Encounter for other preprocedural examination: Secondary | ICD-10-CM | POA: Diagnosis not present

## 2015-11-05 NOTE — Assessment & Plan Note (Signed)
Continue statin. 

## 2015-11-05 NOTE — Patient Instructions (Signed)
Your physician wants you to follow-up in: ONE YEAR WITH DR CRENSHAW You will receive a reminder letter in the mail two months in advance. If you don't receive a letter, please call our office to schedule the follow-up appointment.   If you need a refill on your cardiac medications before your next appointment, please call your pharmacy.  

## 2015-11-05 NOTE — Assessment & Plan Note (Signed)
Patient states she may need to undergo surgery. Her last stress test was normal. Her chest pain does not sound cardiac. She does not have exertional symptoms. She may proceed without further cardiac testing.

## 2015-11-05 NOTE — Assessment & Plan Note (Signed)
Blood pressure controlled. Continue present medications. 

## 2015-11-05 NOTE — Assessment & Plan Note (Signed)
Symptoms are chronic and atypical. Last stress test normal.I do not think further cardiac workup is indicated.

## 2015-11-07 ENCOUNTER — Encounter: Payer: Self-pay | Admitting: Internal Medicine

## 2015-11-07 ENCOUNTER — Ambulatory Visit (INDEPENDENT_AMBULATORY_CARE_PROVIDER_SITE_OTHER): Payer: Medicaid Other | Admitting: Internal Medicine

## 2015-11-07 VITALS — BP 132/69 | HR 80 | Temp 98.0°F | Wt 211.0 lb

## 2015-11-07 DIAGNOSIS — M25552 Pain in left hip: Secondary | ICD-10-CM

## 2015-11-07 DIAGNOSIS — G8929 Other chronic pain: Secondary | ICD-10-CM | POA: Diagnosis present

## 2015-11-07 MED ORDER — NAPROXEN 500 MG PO TABS: 500.0000 mg | ORAL_TABLET | Freq: Two times a day (BID) | ORAL | Status: DC

## 2015-11-07 MED ORDER — KETOROLAC TROMETHAMINE 30 MG/ML IJ SOLN
60.0000 mg | Freq: Once | INTRAMUSCULAR | Status: AC
  Administered 2015-11-07: 60 mg via INTRAMUSCULAR

## 2015-11-07 NOTE — Assessment & Plan Note (Signed)
Chronic left hip pain that is worse again today one month after last office visit and Toradol injection. This pain occurred first and long-standing however seems to be exacerbating more quickly after her workup for fall in June of this year. On physical exam is not extremely characteristic of trochanteric bursitis due to diffuseness of the tenderness to palpation and joint pain reproducible on both inversion and eversion of the joint. Could be radicular symptoms although straight leg raise was unimpressive. Also seems this has been responsive to NSAIDs in the past. Occult fracture seems pretty unlikely at this time point despite her known osteoporosis and history of fall several months ago. 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 Based on this being the fourth office visit for left hip pain within the past 6 months with symptoms occurring more frequently between visits is probably worth evaluating further if there is underlying reversible disease.  Refer patient for CT scan of left hip to rule out avascular necrosis or occult hip fracture IM Toradol 60 mg today  Prescribed naproxen 500 mg twice a day with meals Recommend Tylenol OTC up to maximum of 3 g per day for additional analgesia We'll await CT results for possible ambulatory referral to physical therapy if appropriate

## 2015-11-07 NOTE — Progress Notes (Signed)
Subjective:   Patient ID: [name] [name] female   DOB: [birth year] 49 y.o.   MRN: [id number]  HPI: [name] is a 49 y.o. woman with past medical history as described below who is presenting for worsening of her chronic left hip pain. She has a long-standing history of pain in this hip just been treated numerous times at this clinic with ibuprofen and Toradol injections. However she was seen in July of this year after a fall onto her left hip which greatly worsened her pain and since then she has been developing severe symptoms again within a month after each evaluation. This pain is worse when she lies on her hip but is better when sleeping on her right side. She has pain on weightbearing but is walking without difficulty. There is no associated numbness or weakness in her leg.  See problem based assessment and plan below for additional details.   Past Medical History  Diagnosis Date  . Sarcoidosis ([place])   . OSA (obstructive sleep apnea)   . Prolonged QT interval   . Osteoporosis   . Anemia   . GERD (gastroesophageal reflux disease)   . DM (diabetes mellitus) ([place])   . Peripheral neuropathy ([place])   . HTN (hypertension)   . Nephrolithiasis   . Chronic fatigue   . Obesity   . Headache(784.0)   . Gait disturbance    Current Outpatient Prescriptions  Medication Sig Dispense Refill  . aspirin 81 MG tablet Take 81 mg by mouth at bedtime.     Marland Kitchen atorvastatin (LIPITOR) 20 MG tablet TAKE 1 TABLET (20 MG TOTAL) BY MOUTH EVERY EVENING. 31 tablet 6  . diclofenac sodium (VOLTAREN) 1 % GEL Apply 2 g topically 4 (four) times daily. 1 Tube 0  . folic acid (FOLVITE) 1 MG tablet Take 1 tablet (1 mg total) by mouth daily. 100 tablet 3  . levothyroxine (SYNTHROID, LEVOTHROID) 137 MCG tablet TAKE 1 TABLET BY MOUTH EVERY DAY BEFORE BREAKFAST 60 tablet 2  . metFORMIN (GLUCOPHAGE) 1000 MG tablet Take 1 tablet (1,000 mg total) by mouth 2 (two) times daily with a meal. 120 tablet 2  . Multiple  Vitamin (MULTIVITAMIN WITH MINERALS) TABS tablet Take 1 tablet by mouth every evening.     . Parenteral Therapy Supplies (WINTHROP [place]) [place] See admin instructions.  0  . promethazine (PHENERGAN) 12.5 MG tablet Take 1 tablet (12.5 mg total) by mouth every 6 (six) hours as needed for nausea or vomiting. 30 tablet 0  . RHEUMATREX 2.5 MG tablet TAKE 4 TABLETS (10 MG TOTAL) BY MOUTH ONCE A WEEK. 48 tablet 0  . zolpidem (AMBIEN) 5 MG tablet Take 1 tablet (5 mg total) by mouth at bedtime as needed for sleep. 30 tablet 5   No current facility-administered medications for this visit.   Family History  Problem Relation Age of Onset  . Heart disease Mother     questionable CAD and arrythmias   . Cancer Mother     Breast cancer  . Venous thrombosis Sister   . Diabetes Sister   . Heart disease Sister   . Diabetes Brother   . Heart disease Brother   . Cancer Maternal Aunt   . Venous thrombosis Sister   . Diabetes Sister   . Heart disease Sister    Social History   Social History  . Marital Status: Married    Spouse Name: N/A  . Number of Children: 4  . Years of Education: 23  Occupational History  .     Social History Main Topics  . Smoking status: Never Smoker   . Smokeless tobacco: Never Used  . Alcohol Use: No  . Drug Use: No  . Sexual Activity: Not Asked     Comment: hysterectomy   Other Topics Concern  . None   Social History Narrative   Lives in [place] with 2 sons and her husband;disabled; no use of tobacco products nor alcohol.   Patient given diabetes card on 12/05/2010.   Review of Systems: Review of Systems  Respiratory: Negative for shortness of breath.   Cardiovascular: Negative for leg swelling.  Gastrointestinal: Negative for heartburn, abdominal pain and diarrhea.  Genitourinary: Negative for flank pain.  Musculoskeletal: Positive for joint pain. Negative for falls.  Neurological: Positive for headaches. Negative for focal weakness and  weakness.  Psychiatric/Behavioral: The patient has insomnia.     Objective:  Physical Exam: Filed Vitals:   11/07/15 1333  BP: 132/69  Pulse: 80  Temp: 98 F (36.7 C)  TempSrc: Oral  Weight: 211 lb (95.709 kg)  SpO2: 100%   GENERAL- alert, co-operative, NAD HEENT- Atraumatic, oral mucosa appears moist, good and intact dentition. Pain is recurring again a day just 1 month from her last office visit and Toradol injection. Chronic left hip pain that is recurrent again today just 1 month after last office visit and Toradol injection. Chronic left hip pain chronic BACK- Normal curvature, mild paraspinal tenderness to palpation NEURO- 5 out of 5 strength in bilateral lower extremities, sensation intact throughout, 2+ patellar and Achilles reflexes bilaterally EXTREMITIES- tenderness to palpation over the lateral left hip, pain worsened most on inversion of the left hip and less so on eversion of left hip, straight leg raise was negative past 100, no crepitus appreciated, DP pulse 2+, symmetric, no pedal edema, bruising on left shin PSYCH- Normal mood and affect, appropriate thought content and speech.   Assessment & Plan:

## 2015-11-07 NOTE — Patient Instructions (Addendum)
Today we gave a toradol injection for chronic left hip pain which has recently worsened over the past 5 months particular since a fall in July of this year. I also prescribed naproxen 500 mg twice daily with meals to take as an anti-inflammatory after leaving the clinic today. I recommend stopping naproxen that she find herself with worsening stomach pain as this can occasionally be an irritant. Additionally may take Tylenol up to a maximum of 3 g per day for additional pain control if this is beneficial.  We recommend you for a CT scan of your hip and lower back to better work up this pain in particular to see if there is a small fracture that was not visualized on x-ray or bony degeneration of the hip and lower back given your history of steroids and lower than average bone density. Based on the results of this image we might be able improve treatment for your pain or refer you for physical therapy if appropriate. Please present to [facility] radiology to have this imaging done or call back if you have any questions. [phone].

## 2015-11-19 NOTE — Progress Notes (Signed)
I saw and evaluated the patient.  I personally confirmed the key portions of Dr. Rice's history and exam and reviewed pertinent patient test results.  The assessment, diagnosis, and plan were formulated together and I agree with the documentation in the resident's note. 

## 2015-11-26 ENCOUNTER — Emergency Department (HOSPITAL_COMMUNITY)
Admission: EM | Admit: 2015-11-26 | Discharge: 2015-11-26 | Disposition: A | Discharge status: Home / Self Care | Payer: Medicaid Other | Attending: Emergency Medicine | Admitting: Emergency Medicine

## 2015-11-26 ENCOUNTER — Encounter (HOSPITAL_COMMUNITY): Payer: Self-pay | Admitting: Emergency Medicine

## 2015-11-26 ENCOUNTER — Emergency Department (HOSPITAL_COMMUNITY): Payer: Medicaid Other

## 2015-11-26 DIAGNOSIS — Z8669 Personal history of other diseases of the nervous system and sense organs: Secondary | ICD-10-CM | POA: Insufficient documentation

## 2015-11-26 DIAGNOSIS — R05 Cough: Secondary | ICD-10-CM | POA: Diagnosis present

## 2015-11-26 DIAGNOSIS — Z8719 Personal history of other diseases of the digestive system: Secondary | ICD-10-CM | POA: Diagnosis not present

## 2015-11-26 DIAGNOSIS — I10 Essential (primary) hypertension: Secondary | ICD-10-CM | POA: Insufficient documentation

## 2015-11-26 DIAGNOSIS — E669 Obesity, unspecified: Secondary | ICD-10-CM | POA: Diagnosis not present

## 2015-11-26 DIAGNOSIS — Z87442 Personal history of urinary calculi: Secondary | ICD-10-CM | POA: Diagnosis not present

## 2015-11-26 DIAGNOSIS — Z7982 Long term (current) use of aspirin: Secondary | ICD-10-CM | POA: Diagnosis not present

## 2015-11-26 DIAGNOSIS — Z7984 Long term (current) use of oral hypoglycemic drugs: Secondary | ICD-10-CM | POA: Diagnosis not present

## 2015-11-26 DIAGNOSIS — Z8739 Personal history of other diseases of the musculoskeletal system and connective tissue: Secondary | ICD-10-CM | POA: Diagnosis not present

## 2015-11-26 DIAGNOSIS — J069 Acute upper respiratory infection, unspecified: Secondary | ICD-10-CM | POA: Diagnosis not present

## 2015-11-26 DIAGNOSIS — R1013 Epigastric pain: Secondary | ICD-10-CM | POA: Diagnosis not present

## 2015-11-26 DIAGNOSIS — R1011 Right upper quadrant pain: Secondary | ICD-10-CM | POA: Insufficient documentation

## 2015-11-26 DIAGNOSIS — D649 Anemia, unspecified: Secondary | ICD-10-CM | POA: Insufficient documentation

## 2015-11-26 DIAGNOSIS — E119 Type 2 diabetes mellitus without complications: Secondary | ICD-10-CM | POA: Diagnosis not present

## 2015-11-26 DIAGNOSIS — Z79899 Other long term (current) drug therapy: Secondary | ICD-10-CM | POA: Insufficient documentation

## 2015-11-26 DIAGNOSIS — R52 Pain, unspecified: Secondary | ICD-10-CM

## 2015-11-26 LAB — COMPREHENSIVE METABOLIC PANEL
ALT: 23 U/L (ref 14–54)
ANION GAP: 7 (ref 5–15)
AST: 22 U/L (ref 15–41)
Albumin: 4.3 g/dL (ref 3.5–5.0)
Alkaline Phosphatase: 73 U/L (ref 38–126)
BILIRUBIN TOTAL: 0.6 mg/dL (ref 0.3–1.2)
BUN: 15 mg/dL (ref 6–20)
CO2: 27 mmol/L (ref 22–32)
Calcium: 8.9 mg/dL (ref 8.9–10.3)
Chloride: 108 mmol/L (ref 101–111)
Creatinine, Ser: 0.79 mg/dL (ref 0.44–1.00)
Glucose, Bld: 191 mg/dL — ABNORMAL HIGH (ref 65–99)
POTASSIUM: 4 mmol/L (ref 3.5–5.1)
Sodium: 142 mmol/L (ref 135–145)
TOTAL PROTEIN: 7.8 g/dL (ref 6.5–8.1)

## 2015-11-26 LAB — URINALYSIS, ROUTINE W REFLEX MICROSCOPIC
Bilirubin Urine: NEGATIVE
GLUCOSE, UA: NEGATIVE mg/dL
Hgb urine dipstick: NEGATIVE
KETONES UR: NEGATIVE mg/dL
LEUKOCYTES UA: NEGATIVE
NITRITE: NEGATIVE
PROTEIN: NEGATIVE mg/dL
Specific Gravity, Urine: 1.014 (ref 1.005–1.030)
pH: 6.5 (ref 5.0–8.0)

## 2015-11-26 LAB — CBC WITH DIFFERENTIAL/PLATELET
BASOS ABS: 0 10*3/uL (ref 0.0–0.1)
Basophils Relative: 0 %
Eosinophils Absolute: 0.1 10*3/uL (ref 0.0–0.7)
Eosinophils Relative: 1 %
HEMATOCRIT: 38.9 % (ref 36.0–46.0)
Hemoglobin: 12.2 g/dL (ref 12.0–15.0)
Lymphocytes Relative: 23 %
Lymphs Abs: 1.3 10*3/uL (ref 0.7–4.0)
MCH: 28.2 pg (ref 26.0–34.0)
MCHC: 31.4 g/dL (ref 30.0–36.0)
MCV: 90 fL (ref 78.0–100.0)
MONO ABS: 0.6 10*3/uL (ref 0.1–1.0)
Monocytes Relative: 11 %
NEUTROS ABS: 3.8 10*3/uL (ref 1.7–7.7)
Neutrophils Relative %: 65 %
PLATELETS: 257 10*3/uL (ref 150–400)
RBC: 4.32 MIL/uL (ref 3.87–5.11)
RDW: 13.7 % (ref 11.5–15.5)
WBC: 5.8 10*3/uL (ref 4.0–10.5)

## 2015-11-26 LAB — TROPONIN I: Troponin I: <0.03 ng/mL (ref <0.031)

## 2015-11-26 LAB — I-STAT CG4 LACTIC ACID, ED: Lactic Acid, Venous: 1.46 mmol/L (ref 0.5–2.0)

## 2015-11-26 LAB — LIPASE, BLOOD: Lipase: 57 U/L — ABNORMAL HIGH (ref 11–51)

## 2015-11-26 MED ORDER — KETOROLAC TROMETHAMINE 15 MG/ML IJ SOLN
15.0000 mg | Freq: Once | INTRAMUSCULAR | Status: AC
  Administered 2015-11-26: 15 mg via INTRAVENOUS
  Filled 2015-11-26: qty 1

## 2015-11-26 MED ORDER — GI COCKTAIL ~~LOC~~
30.0000 mL | Freq: Once | ORAL | Status: AC
  Administered 2015-11-26: 30 mL via ORAL
  Filled 2015-11-26: qty 30

## 2015-11-26 NOTE — ED Provider Notes (Signed)
CSN: [id number]     Arrival date & time 11/26/15  1558 History   First MD Initiated Contact with Patient 11/26/15 1723     Chief Complaint  Patient presents with  . Cough  . Generalized Body Aches     Patient is a 49 y.o. female presenting with cough. The history is provided by the patient. No language interpreter was used.  Cough  [name] [name] is a 49 y.o. 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  She reports 3 days of nasal congestion and URI type symptoms. For the last week she's had right-sided chest, abdomen, back pain. The pain is constant and worse with movement. She has occasional nausea, constipation, no vomiting, dysuria. She has a mild dry cough. No sore throat. Symptoms are moderate, constant, worsening.   Past Medical History  Diagnosis Date  . Sarcoidosis ([place])   . OSA (obstructive sleep apnea)   . Prolonged QT interval   . Osteoporosis   . Anemia   . GERD (gastroesophageal reflux disease)   . DM (diabetes mellitus) ([place])   . Peripheral neuropathy ([place])   . HTN (hypertension)   . Nephrolithiasis   . Chronic fatigue   . Obesity   . Headache(784.0)   . Gait disturbance    Past Surgical History  Procedure Laterality Date  . Hysteroscopy    . Dilation and curettage of uterus    . Partial hysterectomy  2013  . Suboccipital craniotomy    . Thyroidectomy      Precancerous lesion  . Rotator cuff repair Right     07/2013   Family History  Problem Relation Age of Onset  . Heart disease Mother     questionable CAD and arrythmias   . Cancer Mother     Breast cancer  . Venous thrombosis Sister   . Diabetes Sister   . Heart disease Sister   . Diabetes Brother   . Heart disease Brother   . Cancer Maternal Aunt   . Venous thrombosis Sister   . Diabetes Sister   . Heart disease Sister    Social History  Substance Use Topics  . Smoking status: Never Smoker   . Smokeless  tobacco: Never Used  . Alcohol Use: No   OB History    Gravida Para Term Preterm AB TAB SAB Ectopic Multiple Living   5 4        4      Review of Systems  Respiratory: Positive for cough.   All other systems reviewed and are negative.      Allergies  Review of patient's allergies indicates no known allergies.  Home Medications   Prior to Admission medications   Medication Sig Start Date End Date Taking? Authorizing Provider  aspirin 81 MG tablet Take 81 mg by mouth at bedtime.    Yes Historical Provider, MD  atorvastatin (LIPITOR) 20 MG tablet TAKE 1 TABLET (20 MG TOTAL) BY MOUTH EVERY EVENING. 09/11/15  Yes Collier [place], MD  folic acid (FOLVITE) 1 MG tablet Take 1 tablet (1 mg total) by mouth daily. 09/07/15 09/06/16 Yes Collier [place], MD  levothyroxine (SYNTHROID, LEVOTHROID) 137 MCG tablet TAKE 1 TABLET BY MOUTH EVERY DAY BEFORE BREAKFAST 06/22/15  Yes Sid Falcon, MD  losartan (COZAAR) 50 MG tablet Take 50 mg by mouth daily. 11/13/15  Yes Historical Provider, MD  metFORMIN (GLUCOPHAGE) 1000 MG tablet Take 1 tablet (1,000  mg total) by mouth 2 (two) times daily with a meal. 09/07/15  Yes Collier [place]ina, MD  Multiple Vitamin (MULTIVITAMIN WITH MINERALS) TABS tablet Take 1 tablet by mouth every evening.    Yes Historical Provider, MD  naproxen (NAPROSYN) 500 MG tablet Take 1 tablet (500 mg total) by mouth 2 (two) times daily with a meal. 11/07/15 11/06/16 Yes Collier Salina, MD  diclofenac sodium (VOLTAREN) 1 % GEL Apply 2 g topically 4 (four) times daily. Patient not taking: Reported on 11/26/2015 12/06/14   Jones Bales, MD  Parenteral Therapy Supplies [facility] [employer] [facility]) [place] See admin instructions. 06/01/15   Historical Provider, MD  promethazine (PHENERGAN) 12.5 MG tablet Take 1 tablet (12.5 mg total) by mouth every 6 (six) hours as needed for nausea or vomiting. Patient not taking: Reported on 11/26/2015 05/10/15   Luan Moore, MD  RHEUMATREX 2.5 MG  tablet TAKE 4 TABLETS (10 MG TOTAL) BY MOUTH ONCE A WEEK. 10/24/15   Marcial Pacas, MD  zolpidem (AMBIEN) 5 MG tablet Take 1 tablet (5 mg total) by mouth at bedtime as needed for sleep. Patient not taking: Reported on 11/26/2015 01/02/15   Luan Moore, MD   BP 137/74 mmHg  Pulse 87  Temp(Src) 98.8 F (37.1 C) (Oral)  Resp 16  SpO2 92%  LMP 12/03/2011 Physical Exam  Constitutional: She is oriented to person, place, and time. She appears well-developed and well-nourished.  HENT:  Head: Normocephalic and atraumatic.  Cardiovascular: Normal rate and regular rhythm.   No murmur heard. Pulmonary/Chest: Effort normal and breath sounds normal. No respiratory distress. She exhibits tenderness.  Abdominal: Soft. There is no rebound and no guarding.  Mild epigastric and right upper quadrant tenderness  Musculoskeletal: She exhibits no edema or tenderness.  Neurological: She is alert and oriented to person, place, and time.  Skin: Skin is warm and dry.  Psychiatric: She has a normal mood and affect. Her behavior is normal.  Nursing note and vitals reviewed.   ED Course  Procedures (including critical care time) Labs Review Labs Reviewed  COMPREHENSIVE METABOLIC PANEL - Abnormal; Notable for the following:    Glucose, Bld 191 (*)    All other components within normal limits  LIPASE, BLOOD - Abnormal; Notable for the following:    Lipase 57 (*)    All other components within normal limits  CULTURE, BLOOD (ROUTINE X 2)  CULTURE, BLOOD (ROUTINE X 2)  URINE CULTURE  URINALYSIS, ROUTINE W REFLEX MICROSCOPIC (NOT AT [facility])  CBC WITH DIFFERENTIAL/PLATELET  TROPONIN I  I-STAT CG4 LACTIC ACID, ED    Imaging Review Dg Chest 2 View  11/26/2015  CLINICAL DATA:  Generalized aches and weakness, last chemotherapy on Friday. EXAM: CHEST  2 VIEW COMPARISON:  Radiograph 12/19/2014 FINDINGS: Normal mediastinum and cardiac silhouette. Normal pulmonary vasculature. No evidence of effusion, infiltrate, or  pneumothorax. No acute bony abnormality. IMPRESSION: No acute cardiopulmonary process. Electronically Signed   By: Suzy Bouchard M.D.   On: 11/26/2015 17:33   I have personally reviewed and evaluated these images and lab results as part of my medical decision-making.   EKG Interpretation None      MDM   Final diagnoses:  Acute URI  Body aches    Patient he is here for evaluation of congestion, body aches, chest and abdominal pain. 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 She is nontoxic appearing on examination with no respiratory distress. No evidence of acute bacterial process at this time.  Chest and abdominal pain is not consistent with acute PE, dissection, cholecystitis. Discussed with patient home care for myalgias URI, outpatient follow-up, return precautions.  [name] Reichert, MD 11/27/15 [phone]

## 2015-11-26 NOTE — ED Notes (Signed)
Pt complaint of generalized aches and cough onset Friday; last chemo treatment Saturday sarcoidosis of the brain.

## 2015-11-26 NOTE — Discharge Instructions (Signed)
Upper Respiratory Infection, Adult Most upper respiratory infections (URIs) are a viral infection of the air passages leading to the lungs. A URI affects the nose, throat, and upper air passages. The most common type of URI is nasopharyngitis and is typically referred to as "the common cold." URIs run their course and usually go away on their own. Most of the time, a URI does not require medical attention, but sometimes a bacterial infection in the upper airways can follow a viral infection. This is called a secondary infection. Sinus and middle ear infections are common types of secondary upper respiratory infections. Bacterial pneumonia can also complicate a URI. A URI can worsen asthma and chronic obstructive pulmonary disease (COPD). Sometimes, these complications can require emergency medical care and may be life threatening.  CAUSES Almost all URIs are caused by viruses. A virus is a type of germ and can spread from one person to another.  RISKS FACTORS You may be at risk for a URI if:   You smoke.   You have chronic heart or lung disease.  You have a weakened defense (immune) system.   You are very young or very old.   You have nasal allergies or asthma.  You work in crowded or poorly ventilated areas.  You work in health care facilities or schools. SIGNS AND SYMPTOMS  Symptoms typically develop 2-3 days after you come in contact with a cold virus. Most viral URIs last 7-10 days. However, viral URIs from the influenza virus (flu virus) can last 14-18 days and are typically more severe. Symptoms may include:   Runny or stuffy (congested) nose.   Sneezing.   Cough.   Sore throat.   Headache.   Fatigue.   Fever.   Loss of appetite.   Pain in your forehead, behind your eyes, and over your cheekbones (sinus pain).  Muscle aches.  DIAGNOSIS  Your health care provider may diagnose a URI by:  Physical exam.  Tests to check that your symptoms are not due to  another condition such as:  Strep throat.  Sinusitis.  Pneumonia.  Asthma. TREATMENT  A URI goes away on its own with time. It cannot be cured with medicines, but medicines may be prescribed or recommended to relieve symptoms. Medicines may help:  Reduce your fever.  Reduce your cough.  Relieve nasal congestion. HOME CARE INSTRUCTIONS   Take medicines only as directed by your health care provider.   Gargle warm saltwater or take cough drops to comfort your throat as directed by your health care provider.  Use a warm mist humidifier or inhale steam from a shower to increase air moisture. This may make it easier to breathe.  Drink enough fluid to keep your urine clear or pale yellow.   Eat soups and other clear broths and maintain good nutrition.   Rest as needed.   Return to work when your temperature has returned to normal or as your health care provider advises. You may need to stay home longer to avoid infecting others. You can also use a face mask and careful hand washing to prevent spread of the virus.  Increase the usage of your inhaler if you have asthma.   Do not use any tobacco products, including cigarettes, chewing tobacco, or electronic cigarettes. If you need help quitting, ask your health care provider. PREVENTION  The best way to protect yourself from getting a cold is to practice good hygiene.   Avoid oral or hand contact with people with cold  symptoms.   Wash your hands often if contact occurs.  There is no clear evidence that vitamin C, vitamin E, echinacea, or exercise reduces the chance of developing a cold. However, it is always recommended to get plenty of rest, exercise, and practice good nutrition.  SEEK MEDICAL CARE IF:   You are getting worse rather than better.   Your symptoms are not controlled by medicine.   You have chills.  You have worsening shortness of breath.  You have brown or red mucus.  You have yellow or brown nasal  discharge.  You have pain in your face, especially when you bend forward.  You have a fever.  You have swollen neck glands.  You have pain while swallowing.  You have white areas in the back of your throat. SEEK IMMEDIATE MEDICAL CARE IF:   You have severe or persistent:  Headache.  Ear pain.  Sinus pain.  Chest pain.  You have chronic lung disease and any of the following:  Wheezing.  Prolonged cough.  Coughing up blood.  A change in your usual mucus.  You have a stiff neck.  You have changes in your:  Vision.  Hearing.  Thinking.  Mood. MAKE SURE YOU:   Understand these instructions.  Will watch your condition.  Will get help right away if you are not doing well or get worse.   This information is not intended to replace advice given to you by your health care provider. Make sure you discuss any questions you have with your health care provider.   Document Released: 05/13/2001 Document Revised: 04/03/2015 Document Reviewed: 02/22/2014 Elsevier Interactive Patient Education 2016 Elsevier Inc.  Musculoskeletal Pain Musculoskeletal pain is muscle and boney aches and pains. These pains can occur in any part of the body. Your caregiver may treat you without knowing the cause of the pain. They may treat you if blood or urine tests, X-rays, and other tests were normal.  CAUSES There is often not a definite cause or reason for these pains. These pains may be caused by a type of germ (virus). The discomfort may also come from overuse. Overuse includes working out too hard when your body is not fit. Boney aches also come from weather changes. Bone is sensitive to atmospheric pressure changes. HOME CARE INSTRUCTIONS   Ask when your test results will be ready. Make sure you get your test results.  Only take over-the-counter or prescription medicines for pain, discomfort, or fever as directed by your caregiver. If you were given medications for your condition,  do not drive, operate machinery or power tools, or sign legal documents for 24 hours. Do not drink alcohol. Do not take sleeping pills or other medications that may interfere with treatment.  Continue all activities unless the activities cause more pain. When the pain lessens, slowly resume normal activities. Gradually increase the intensity and duration of the activities or exercise.  During periods of severe pain, bed rest may be helpful. Lay or sit in any position that is comfortable.  Putting ice on the injured area.  Put ice in a bag.  Place a towel between your skin and the bag.  Leave the ice on for 15 to 20 minutes, 3 to 4 times a day.  Follow up with your caregiver for continued problems and no reason can be found for the pain. If the pain becomes worse or does not go away, it may be necessary to repeat tests or do additional testing. Your caregiver may need  to look further for a possible cause. SEEK IMMEDIATE MEDICAL CARE IF:  You have pain that is getting worse and is not relieved by medications.  You develop chest pain that is associated with shortness or breath, sweating, feeling sick to your stomach (nauseous), or throw up (vomit).  Your pain becomes localized to the abdomen.  You develop any new symptoms that seem different or that concern you. MAKE SURE YOU:   Understand these instructions.  Will watch your condition.  Will get help right away if you are not doing well or get worse.   This information is not intended to replace advice given to you by your health care provider. Make sure you discuss any questions you have with your health care provider.   Document Released: 11/17/2005 Document Revised: 02/09/2012 Document Reviewed: 07/22/2013 Elsevier Interactive Patient Education [employer].

## 2015-11-26 NOTE — ED Notes (Signed)
Pt ambulated to the bathroom without incident. In and out not needed, RN notified.

## 2015-11-29 LAB — URINE CULTURE

## 2015-11-30 ENCOUNTER — Telehealth (HOSPITAL_COMMUNITY): Payer: Self-pay

## 2015-11-30 NOTE — Progress Notes (Signed)
ED Antimicrobial Stewardship Positive Culture Follow Up   [name] [name] is an 49 y.o. female who presented to [facility] on 11/26/2015 with a chief complaint of  Chief Complaint  Patient presents with  . Cough  . Generalized Body Aches    Recent Results (from the past 720 hour(s))  Culture, blood (routine x 2)     Status: None (Preliminary result)   Collection Time: 11/26/15  5:20 PM  Result Value Ref Range Status   Specimen Description BLOOD LEFT ANTECUBITAL  Final   Special Requests BOTTLES DRAWN AEROBIC AND ANAEROBIC 5CC  Final   Culture   Final    NO GROWTH 3 DAYS Performed at [facility]    Report Status PENDING  Incomplete  Culture, blood (routine x 2)     Status: None (Preliminary result)   Collection Time: 11/26/15  5:27 PM  Result Value Ref Range Status   Specimen Description BLOOD RIGHT FOREARM  Final   Special Requests BOTTLES DRAWN AEROBIC AND ANAEROBIC 5CC  Final   Culture   Final    NO GROWTH 3 DAYS Performed at [facility]    Report Status PENDING  Incomplete  Urine culture     Status: None   Collection Time: 11/26/15  7:40 PM  Result Value Ref Range Status   Specimen Description URINE, CATHETERIZED  Final   Special Requests NONE  Final   Culture   Final    20,000 COLONIES/mL PROTEUS MIRABILIS 20,000 COLONIES/mL KLEBSIELLA PNEUMONIAE Performed at [facility]    Report Status 11/29/2015 FINAL  Final   Organism ID, Bacteria PROTEUS MIRABILIS  Final   Organism ID, Bacteria KLEBSIELLA PNEUMONIAE  Final      Susceptibility   Klebsiella pneumoniae - MIC*    AMPICILLIN >=32 RESISTANT Resistant     CEFAZOLIN <=4 SENSITIVE Sensitive     CEFTRIAXONE <=1 SENSITIVE Sensitive     CIPROFLOXACIN <=0.25 SENSITIVE Sensitive     GENTAMICIN <=1 SENSITIVE Sensitive     IMIPENEM <=0.25 SENSITIVE Sensitive     NITROFURANTOIN 64 INTERMEDIATE Intermediate     TRIMETH/SULFA <=20 SENSITIVE Sensitive     AMPICILLIN/SULBACTAM 4 SENSITIVE Sensitive      PIP/TAZO 8 SENSITIVE Sensitive     * 20,000 COLONIES/mL KLEBSIELLA PNEUMONIAE   Proteus mirabilis - MIC*    AMPICILLIN <=2 SENSITIVE Sensitive     CEFAZOLIN <=4 SENSITIVE Sensitive     CEFTRIAXONE <=1 SENSITIVE Sensitive     CIPROFLOXACIN <=0.25 SENSITIVE Sensitive     GENTAMICIN <=1 SENSITIVE Sensitive     IMIPENEM 2 SENSITIVE Sensitive     NITROFURANTOIN 64 RESISTANT Resistant     TRIMETH/SULFA <=20 SENSITIVE Sensitive     AMPICILLIN/SULBACTAM <=2 SENSITIVE Sensitive     PIP/TAZO <=4 SENSITIVE Sensitive     * 20,000 COLONIES/mL PROTEUS MIRABILIS   Antibiotic treatment is not indicated as patient does not have signs of a UTI and has only grown 20k bacteria in urine culture.   ED Provider: Bernerd Limbo, PA-C  [name] 11/30/2015, 8:18 AM Infectious Diseases Pharmacist Phone# [phone]

## 2015-11-30 NOTE — Telephone Encounter (Signed)
Post ED Visit - Positive Culture Follow-up  Culture report reviewed by antimicrobial stewardship pharmacist:  []  Elenor Quinones, Pharm.D. []  Heide Guile, Pharm.D., BCPS [x]  Parks Neptune, Pharm.D. []  Alycia Rossetti, Pharm.D., BCPS []  [place], Pharm.D., BCPS, AAHIVP []  Legrand Como, Pharm.D., BCPS, AAHIVP []  Milus Glazier, Pharm.D. []  Rob [place], Pharm.D.  Positive urine culture, 20,000 colonies klebsiella pneumoniae and proteus mirabilis  Chart reviewed by V. Pickering NP -> No tx  Dortha Kern 11/30/2015, 2:29 PM

## 2015-12-01 LAB — CULTURE, BLOOD (ROUTINE X 2)
Culture: NO GROWTH
Culture: NO GROWTH

## 2015-12-07 ENCOUNTER — Ambulatory Visit (HOSPITAL_COMMUNITY)
Admission: RE | Admit: 2015-12-07 | Discharge: 2015-12-07 | Disposition: A | Discharge status: Home / Self Care | Payer: Medicaid Other | Source: Ambulatory Visit | Attending: Student in an Organized Health Care Education/Training Program | Admitting: Student in an Organized Health Care Education/Training Program

## 2015-12-07 DIAGNOSIS — M25552 Pain in left hip: Secondary | ICD-10-CM | POA: Insufficient documentation

## 2015-12-07 DIAGNOSIS — G8929 Other chronic pain: Secondary | ICD-10-CM | POA: Diagnosis not present

## 2015-12-19 ENCOUNTER — Ambulatory Visit (INDEPENDENT_AMBULATORY_CARE_PROVIDER_SITE_OTHER): Payer: Medicaid Other | Admitting: Internal Medicine

## 2015-12-19 ENCOUNTER — Encounter: Payer: Self-pay | Admitting: Internal Medicine

## 2015-12-19 ENCOUNTER — Other Ambulatory Visit: Payer: Self-pay | Admitting: Internal Medicine

## 2015-12-19 VITALS — BP 151/86 | HR 78 | Temp 98.3°F | Wt 210.6 lb

## 2015-12-19 DIAGNOSIS — M25552 Pain in left hip: Secondary | ICD-10-CM

## 2015-12-19 DIAGNOSIS — G8929 Other chronic pain: Secondary | ICD-10-CM

## 2015-12-19 DIAGNOSIS — E119 Type 2 diabetes mellitus without complications: Secondary | ICD-10-CM

## 2015-12-19 MED ORDER — MELOXICAM 7.5 MG PO TABS: 15.0000 mg | ORAL_TABLET | Freq: Every day | ORAL | Status: DC

## 2015-12-19 NOTE — Assessment & Plan Note (Signed)
Patient continues to complain of left hip pain for the last two years.  She had a CT hip performed which showed mild degenerative changes of SI joint without hip pathology.  She continues to describe sharp, achy pains and numbness/tingling that extend down the left leg.  Pain worse with lying flat or sitting up.  No change with bending forward.  Slight decrease in pain with Tylenol/Aleve.  She has had the most relief with Toradol injections, with relief lasting up to 2 weeks.  She was seen by Dr. Micheline Chapman in July 2016, who diagnosed Posttraumatic Greater Trochanteric Bursitis worsened after a recent fall.  She has previously attempted Diclofenac, Ibuprofen, Naproxen, Gabapentin, Lyrica, Percocet, and Tramadol without relief.  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    A/P: Probable arthritis, though etiology unclear.  CT negative for acute pathology without evidence of fracture.  SI pathology may be 2/2 weight with possible nerve impingement accounting for numbness.  Trochanteric bursitis is inconsistent with patient's description, as she has pain superior to the greater trochanter, most prominent over iliac wing.  Repeat Toradol injections with relief only lasting 2 weeks is not a longterm solution.  Patient has tried multiple pain medications already, but has not tried Meloxicam or Salsalate.  Will attempt Meloxicam, but if patient fails, her last choice is Salsalate.  - Meloxicam 15 mg daily for 7 days, then PRN - Tylenol 1 g TID PRN

## 2015-12-19 NOTE — Progress Notes (Signed)
Patient ID: [name] [name], female   DOB: May 25, 1966, 50 y.o.   MRN: [id number]    Subjective:   Patient ID: [name] [name] female   DOB: 1966-08-14 50 y.o.   MRN: [id number]  HPI: [name] [name] is a 50 y.o. female with PMH as below, here for f/u of left hip pain.  Please see Problem-Based charting for the status of the patient's chronic medical issues.     Past Medical History  Diagnosis Date  . Sarcoidosis ([place])   . OSA (obstructive sleep apnea)   . Prolonged QT interval   . Osteoporosis   . Anemia   . GERD (gastroesophageal reflux disease)   . DM (diabetes mellitus) ([place])   . Peripheral neuropathy ([place])   . HTN (hypertension)   . Nephrolithiasis   . Chronic fatigue   . Obesity   . Headache(784.0)   . Gait disturbance    Current Outpatient Prescriptions  Medication Sig Dispense Refill  . aspirin 81 MG tablet Take 81 mg by mouth at bedtime.     Marland Kitchen atorvastatin (LIPITOR) 20 MG tablet TAKE 1 TABLET (20 MG TOTAL) BY MOUTH EVERY EVENING. 31 tablet 6  . diclofenac sodium (VOLTAREN) 1 % GEL Apply 2 g topically 4 (four) times daily. (Patient not taking: Reported on [id number]) 1 Tube 0  . folic acid (FOLVITE) 1 MG tablet Take 1 tablet (1 mg total) by mouth daily. 100 tablet 3  . levothyroxine (SYNTHROID, LEVOTHROID) 137 MCG tablet TAKE 1 TABLET BY MOUTH EVERY DAY BEFORE BREAKFAST 60 tablet 2  . losartan (COZAAR) 50 MG tablet Take 50 mg by mouth daily.  11  . metFORMIN (GLUCOPHAGE) 1000 MG tablet Take 1 tablet (1,000 mg total) by mouth 2 (two) times daily with a meal. 120 tablet 2  . Multiple Vitamin (MULTIVITAMIN WITH MINERALS) TABS tablet Take 1 tablet by mouth every evening.     . naproxen (NAPROSYN) 500 MG tablet Take 1 tablet (500 mg total) by mouth 2 (two) times daily with a meal. 60 tablet 2  . Parenteral Therapy Supplies (WINTHROP [place]) [facility] See admin instructions.  0  . promethazine (PHENERGAN) 12.5 MG tablet Take 1 tablet (12.5 mg total) by mouth every  6 (six) hours as needed for nausea or vomiting. (Patient not taking: Reported on 11/26/2015) 30 tablet 0  . RHEUMATREX 2.5 MG tablet TAKE 4 TABLETS (10 MG TOTAL) BY MOUTH ONCE A WEEK. 48 tablet 0  . zolpidem (AMBIEN) 5 MG tablet Take 1 tablet (5 mg total) by mouth at bedtime as needed for sleep. (Patient not taking: Reported on 11/26/2015) 30 tablet 5   No current facility-administered medications for this visit.   Family History  Problem Relation Age of Onset  . Heart disease Mother     questionable CAD and arrythmias   . Cancer Mother     Breast cancer  . Venous thrombosis Sister   . Diabetes Sister   . Heart disease Sister   . Diabetes Brother   . Heart disease Brother   . Cancer Maternal Aunt   . Venous thrombosis Sister   . Diabetes Sister   . Heart disease Sister    Social History   Social History  . Marital Status: Married    Spouse Name: N/A  . Number of Children: 4  . Years of Education: 12   Occupational History  .     Social History Main Topics  . Smoking status: Never Smoker   . Smokeless tobacco:  Never Used  . Alcohol Use: No  . Drug Use: No  . Sexual Activity: Not on file     Comment: hysterectomy   Other Topics Concern  . Not on file   Social History Narrative   Lives in [place] with 2 sons and her husband;disabled; no use of tobacco products nor alcohol.   Patient given diabetes card on 12/05/2010.   Review of Systems: No fevers, chills, dysuria, or bowel or bladder incontinence. Objective:  Physical Exam: There were no vitals filed for this visit. Physical Exam  Constitutional: She is oriented to person, place, and time and well-developed, well-nourished, and in no distress. No distress.  HENT:  Head: Normocephalic and atraumatic.  Eyes: EOM are normal. No scleral icterus.  Neck: No tracheal deviation present.  Cardiovascular: Normal rate and regular rhythm.   Pulmonary/Chest: Effort normal and breath sounds normal. No stridor.    Neurological: She is alert and oriented to person, place, and time.  Lumbar spine nontender to palpation.  Pain with palpation of left iliac wing.  No anterior hip pain to palpation.  Pain reproduced with straight leg raise on left.  Pain worsened with inversion and eversion of hip.  No pain with internal/external rotation of hip.  Patellar reflexes 1+ and symmetric.  Skin: Skin is warm and dry. She is not diaphoretic.     Assessment & Plan:   Patient and case were discussed with Dr. Eppie Gibson.  Please refer to Problem Based charting for further documentation.

## 2015-12-19 NOTE — Patient Instructions (Signed)
1. Take Meloxicam 15 mg (2 tabs) once a day with food for one week, then as needed. 2. You may also take Tylenol 1000 mg up to three times daily as needed.  3. Return to clinic in 2 months.  Meloxicam capsules What is this medicine? MELOXICAM (mel OX i cam) is a non-steroidal anti-inflammatory drug (NSAID). It is used to reduce swelling and to treat pain. It is used for osteoarthritis. This medicine may be used for other purposes; ask your health care provider or pharmacist if you have questions. What should I tell my health care provider before I take this medicine? They need to know if you have any of these conditions: -bleeding disorders -cigarette smoker -coronary artery bypass graft (CABG) surgery within the past 2 weeks -drink more than 3 alcohol-containing drinks per day -heart disease -high blood pressure -history of stomach bleeding -kidney disease -liver disease -lung or breathing disease, like asthma -stomach or intestine problems -an unusual or allergic reaction to meloxicam, aspirin, other NSAIDs, other medicines, foods, dyes, or preservatives -pregnant or trying to get pregnant -breast-feeding How should I use this medicine? Take this medicine by mouth with a full glass of water. Follow the directions on the prescription label. You can take it with or without food. If it upsets your stomach, take it with food. Take your medicine at regular intervals. Do not take it more often than directed. Do not stop taking except on your doctor's advice. A special MedGuide will be given to you by the pharmacist with each prescription and refill. Be sure to read this information carefully each time. Talk to your pediatrician regarding the use of this medicine in children. Special care may be needed. Patients over 64 years old may have a stronger reaction and need a smaller dose. Overdosage: If you think you have taken too much of this medicine contact a poison control center or emergency  room at once. NOTE: This medicine is only for you. Do not share this medicine with others. What if I miss a dose? If you miss a dose, take it as soon as you can. If it is almost time for your next dose, take only that dose. Do not take double or extra doses. What may interact with this medicine? Do not take this medicine with any of the following medications: -cidofovir -ketorolac This medicine may also interact with the following medications: -aspirin and aspirin-like medicines -certain medicines for blood pressure, heart disease, irregular heart beat -certain medicines for depression, anxiety, or psychotic disturbances -certain medicines that treat or prevent blood clots like warfarin, enoxaparin, dalteparin, apixaban, dabigatran, rivaroxaban -cyclosporine -digoxin -diuretics -methotrexate -other NSAIDs, medicines for pain and inflammation, like ibuprofen and naproxen -pemetrexed This list may not describe all possible interactions. Give your health care provider a list of all the medicines, herbs, non-prescription drugs, or dietary supplements you use. Also tell them if you smoke, drink alcohol, or use illegal drugs. Some items may interact with your medicine. What should I watch for while using this medicine? Tell your doctor or healthcare professional if your symptoms do not start to get better or if they get worse. Do not take other medicines that contain aspirin, ibuprofen, or naproxen with this medicine. Side effects such as stomach upset, nausea, or ulcers may be more likely to occur. Many medicines available without a prescription should not be taken with this medicine. This medicine can cause ulcers and bleeding in the stomach and intestines at any time during treatment. This can happen  with no warning and may cause death. There is increased risk with taking this medicine for a long time. Smoking, drinking alcohol, older age, and poor health can also increase risks. Call your doctor  right away if you have stomach pain or blood in your vomit or stool. This medicine does not prevent heart attack or stroke. In fact, this medicine may increase the chance of a heart attack or stroke. The chance may increase with longer use of this medicine and in people who have heart disease. If you take aspirin to prevent heart attack or stroke, talk with your doctor or health care professional. What side effects may I notice from receiving this medicine? Side effects that you should report to your doctor or health care professional as soon as possible: -allergic reactions like skin rash, itching or hives, swelling of the face, lips, or tongue -nausea, vomiting -signs and symptoms of a blood clot such as breathing problems; changes in vision; chest pain; severe, sudden headache; pain, swelling, warmth in the leg; trouble speaking; sudden numbness or weakness of the face, arm, or leg -signs and symptoms of bleeding such as bloody or black, tarry stools; red or dark-brown urine; spitting up blood or brown material that looks like coffee grounds; red spots on the skin; unusual bruising or bleeding from the eye, gums, or nose -signs and symptoms of liver injury like dark yellow or brown urine; general ill feeling or flu-like symptoms; light-colored stools; loss of appetite; nausea; right upper belly pain; unusually weak or tired; yellowing of the eyes or skin -signs and symptoms of stroke like changes in vision; confusion; trouble speaking or understanding; severe headaches; sudden numbness or weakness of the face, arm, or leg; trouble walking; dizziness; loss of balance or coordination Side effects that usually do not require medical attention (report these to your doctor or health care professional if they continue or are bothersome): -constipation -diarrhea -gas This list may not describe all possible side effects. Call your doctor for medical advice about side effects. You may report side effects to  FDA at 1-800-FDA-1088. Where should I keep my medicine? Keep out of the reach of children. Store at room temperature between 15 and 30 degrees C (59 and 86 degrees F). Throw away any unused medicine after the expiration date. NOTE: This sheet is a summary. It may not cover all possible information. If you have questions about this medicine, talk to your doctor, pharmacist, or health care provider.    2016, Elsevier/[employer]. (2014-11-25 00:44:17)

## 2015-12-20 ENCOUNTER — Ambulatory Visit: Payer: Self-pay | Admitting: Adult Health

## 2015-12-20 ENCOUNTER — Telehealth: Payer: Self-pay | Admitting: Adult Health

## 2015-12-20 DIAGNOSIS — G8929 Other chronic pain: Secondary | ICD-10-CM

## 2015-12-20 DIAGNOSIS — M25552 Pain in left hip: Principal | ICD-10-CM

## 2015-12-20 MED ORDER — HYDROCODONE-ACETAMINOPHEN 5-325 MG PO TABS: 1.0000 | ORAL_TABLET | Freq: Four times a day (QID) | ORAL | Status: DC | PRN

## 2015-12-20 NOTE — Telephone Encounter (Signed)
Patient called to request something for headache pain

## 2015-12-20 NOTE — Telephone Encounter (Signed)
Called patient and informed Rx ready for pick up at front desk. Patient verbalized understanding.  

## 2015-12-20 NOTE — Telephone Encounter (Signed)
Prescription printed and given to casandra

## 2015-12-20 NOTE — Addendum Note (Signed)
Addended by: Trudie Buckler on: 12/20/2015 09:29 AM   Modules accepted: Orders

## 2015-12-20 NOTE — Telephone Encounter (Addendum)
Patient having HA with no relief. 8 on pain scale. Scheduled appt for patient today w/ NP-Megan. Spoke with [employer]. Called patient back advised Jinny Blossom would refill med and she would need to schedule next available appt w/ Dr. Jannifer Franklin. Patient agreed.  Patient requesting the latest spot available on Dr.  Tobey Grim daily schedule due to work schedule. Advised would c/b with available times. Patient agreed.

## 2015-12-20 NOTE — Progress Notes (Signed)
Case discussed with Dr. Lovena Le at the time of the visit.  We reviewed the resident's history and exam and pertinent patient test results.  I agree with the assessment, diagnosis and plan of care documented in the resident's note.

## 2015-12-21 ENCOUNTER — Encounter: Payer: Self-pay | Admitting: Internal Medicine

## 2015-12-21 ENCOUNTER — Ambulatory Visit (INDEPENDENT_AMBULATORY_CARE_PROVIDER_SITE_OTHER): Payer: Medicaid Other | Admitting: Internal Medicine

## 2015-12-21 VITALS — BP 166/94 | HR 90 | Temp 98.2°F | Ht 68.0 in | Wt 215.7 lb

## 2015-12-21 DIAGNOSIS — M25552 Pain in left hip: Secondary | ICD-10-CM

## 2015-12-21 DIAGNOSIS — G8929 Other chronic pain: Secondary | ICD-10-CM | POA: Diagnosis not present

## 2015-12-21 MED ORDER — MELOXICAM 7.5 MG PO TABS: 15.0000 mg | ORAL_TABLET | Freq: Every day | ORAL | Status: DC

## 2015-12-21 NOTE — Assessment & Plan Note (Signed)
A/P: Probable arthritis.  She has only taken 3 pills of Meloxicam total and did not take a full 15 mg dose.  Unable to determine at this time whether she has failed therapy.  Will continue Meloxicam and follow up in 10 days. - Meloxicam 15 mg daily - Tylenol 1 g TID PRN

## 2015-12-21 NOTE — Patient Instructions (Signed)
1. Take 2 tablets every morning with food. 2. Return to clinic 1/30 or 1/31. 3. Set up appointment with Nutrition services.

## 2015-12-21 NOTE — Progress Notes (Signed)
Patient ID: [name] [name], female   DOB: 1966/01/07, 50 y.o.   MRN: [id number]    Subjective:   Patient ID: [name] [name] female   DOB: 09-Jun-1966 50 y.o.   MRN: [id number]  HPI: [name] [name] is a 50 y.o. female with PMH as below, here for f/u hip pain.  Please see Problem-Based charting for the status of the patient's chronic medical issues.  Patient states her hip pain is unchanged.  She took Meloxicam 7.5 mg twice on Wednesday and one 7.5 mg dose on Thursday.  She did not take any today (Friday).  She has not taken a full 15 mg dose.  She continues to have numbness/tingling over left back and down left lateral leg. She denies swelling or redness.     Past Medical History  Diagnosis Date  . Sarcoidosis ([place])   . OSA (obstructive sleep apnea)   . Prolonged QT interval   . Osteoporosis   . Anemia   . GERD (gastroesophageal reflux disease)   . DM (diabetes mellitus) ([place])   . Peripheral neuropathy ([place])   . HTN (hypertension)   . Nephrolithiasis   . Chronic fatigue   . Obesity   . Headache(784.0)   . Gait disturbance    Current Outpatient Prescriptions  Medication Sig Dispense Refill  . aspirin 81 MG tablet Take 81 mg by mouth at bedtime.     Marland Kitchen atorvastatin (LIPITOR) 20 MG tablet TAKE 1 TABLET (20 MG TOTAL) BY MOUTH EVERY EVENING. 31 tablet 6  . diclofenac sodium (VOLTAREN) 1 % GEL Apply 2 g topically 4 (four) times daily. (Patient not taking: Reported on [id number]) 1 Tube 0  . folic acid (FOLVITE) 1 MG tablet Take 1 tablet (1 mg total) by mouth daily. 100 tablet 3  . HYDROcodone-acetaminophen (NORCO/VICODIN) 5-325 MG tablet Take 1 tablet by mouth every 6 (six) hours as needed for moderate pain. For headache 30 tablet 0  . levothyroxine (SYNTHROID, LEVOTHROID) 137 MCG tablet TAKE 1 TABLET BY MOUTH EVERY DAY BEFORE BREAKFAST 60 tablet 2  . losartan (COZAAR) 50 MG tablet Take 50 mg by mouth daily.  11  . meloxicam (MOBIC) 7.5 MG tablet Take 2 tablets (15 mg total)  by mouth daily. 30 tablet 0  . metFORMIN (GLUCOPHAGE) 1000 MG tablet Take 1 tablet (1,000 mg total) by mouth 2 (two) times daily with a meal. 120 tablet 2  . Multiple Vitamin (MULTIVITAMIN WITH MINERALS) TABS tablet Take 1 tablet by mouth every evening.     . naproxen (NAPROSYN) 500 MG tablet Take 1 tablet (500 mg total) by mouth 2 (two) times daily with a meal. 60 tablet 2  . Parenteral Therapy Supplies (WINTHROP [place]) [place] See admin instructions.  0  . promethazine (PHENERGAN) 12.5 MG tablet Take 1 tablet (12.5 mg total) by mouth every 6 (six) hours as needed for nausea or vomiting. (Patient not taking: Reported on 11/26/2015) 30 tablet 0  . RHEUMATREX 2.5 MG tablet TAKE 4 TABLETS (10 MG TOTAL) BY MOUTH ONCE A WEEK. 48 tablet 0  . zolpidem (AMBIEN) 5 MG tablet Take 1 tablet (5 mg total) by mouth at bedtime as needed for sleep. (Patient not taking: Reported on 11/26/2015) 30 tablet 5   No current facility-administered medications for this visit.   Family History  Problem Relation Age of Onset  . Heart disease Mother     questionable CAD and arrythmias   . Cancer Mother     Breast cancer  . Venous  thrombosis Sister   . Diabetes Sister   . Heart disease Sister   . Diabetes Brother   . Heart disease Brother   . Cancer Maternal Aunt   . Venous thrombosis Sister   . Diabetes Sister   . Heart disease Sister    Social History   Social History  . Marital Status: Married    Spouse Name: N/A  . Number of Children: 4  . Years of Education: 12   Occupational History  .     Social History Main Topics  . Smoking status: Never Smoker   . Smokeless tobacco: Never Used  . Alcohol Use: No  . Drug Use: No  . Sexual Activity: Not Asked     Comment: hysterectomy   Other Topics Concern  . None   Social History Narrative   Lives in [place] with 2 sons and her husband;disabled; no use of tobacco products nor alcohol.   Patient given diabetes card on 12/05/2010.   Review of  Systems: Pertinent items are noted in HPI. Objective:  Physical Exam: Filed Vitals:   12/21/15 1327  BP: 166/94  Pulse: 90  Temp: 98.2 F (36.8 C)  TempSrc: Oral  Height: 5\' 8"  (1.727 m)  Weight: 215 lb 11.2 oz (97.841 kg)   Gen: WD, WN, NAD.  HEENT: [place], AT  Assessment & Plan:   Patient and case were discussed with Dr. Lynnae [name].  Please refer to Problem Based charting for further documentation.

## 2015-12-22 NOTE — Progress Notes (Signed)
Internal Medicine Clinic Attending  Case discussed with Dr. Taylor at the time of the visit.  We reviewed the resident's history and exam and pertinent patient test results.  I agree with the assessment, diagnosis, and plan of care documented in the resident's note. 

## 2015-12-25 NOTE — Telephone Encounter (Signed)
Appointment scheduled.

## 2015-12-25 NOTE — Telephone Encounter (Signed)
Pt states her HA's are the same as mentioned before in previous note. Having hip pain also. She believes b/c of hip pain, lack of sleep is causing her to have HA's. Advised patient would schedule an appt with Dr. Jannifer Franklin. Pt agreed.

## 2015-12-26 ENCOUNTER — Other Ambulatory Visit: Payer: Self-pay | Admitting: Gastroenterology

## 2015-12-26 DIAGNOSIS — R110 Nausea: Secondary | ICD-10-CM

## 2015-12-26 DIAGNOSIS — R1013 Epigastric pain: Secondary | ICD-10-CM

## 2015-12-31 ENCOUNTER — Encounter: Payer: Self-pay | Admitting: Neurology

## 2015-12-31 ENCOUNTER — Ambulatory Visit (INDEPENDENT_AMBULATORY_CARE_PROVIDER_SITE_OTHER): Payer: Medicaid Other | Admitting: Neurology

## 2015-12-31 VITALS — BP 158/93 | HR 80 | Ht 68.0 in | Wt 216.5 lb

## 2015-12-31 DIAGNOSIS — D8689 Sarcoidosis of other sites: Secondary | ICD-10-CM | POA: Diagnosis not present

## 2015-12-31 DIAGNOSIS — M25552 Pain in left hip: Secondary | ICD-10-CM | POA: Diagnosis not present

## 2015-12-31 DIAGNOSIS — G8929 Other chronic pain: Secondary | ICD-10-CM

## 2015-12-31 DIAGNOSIS — Z5181 Encounter for therapeutic drug level monitoring: Secondary | ICD-10-CM | POA: Diagnosis not present

## 2015-12-31 DIAGNOSIS — G43019 Migraine without aura, intractable, without status migrainosus: Secondary | ICD-10-CM | POA: Diagnosis not present

## 2015-12-31 MED ORDER — GABAPENTIN 300 MG PO CAPS: 300.0000 mg | ORAL_CAPSULE | Freq: Two times a day (BID) | ORAL | Status: DC

## 2015-12-31 NOTE — Patient Instructions (Signed)
Sarcoidosis  Sarcoidosis is a disease that causes inflammation in your organs and other areas of your body. The lungs are most often affected (pulmonary sarcoidosis). Sarcoidosis can also affect your lymph nodes, liver, eyes, skin, or any other body tissue.  When you have sarcoidosis, small clumps of tissue (granulomas) form in the affected area of your body. Granulomas are made up of your body's defense (immune) cells. Inflammation results when your body reacts to a harmful substance. Normally, inflammation goes away after immune cells get rid of the harmful substance. In sarcoidosis, the immune cells form granulomas instead.  CAUSES   The exact cause of sarcoidosis is not known. Something triggers the immune system to respond, such as dust, chemicals, bacteria, or a virus.   RISK FACTORS  You may be at a greater risk for sarcoidosis if you:   · Have a family history of the disease.  · Are African American.  · Are of Northern European ancestry.  · Are 20-50 years old.  · Are female.  SIGNS AND SYMPTOMS   Many people with sarcoidosis have no symptoms. Others have very mild symptoms. Sarcoidosis most often affects the lungs. Symptoms include:  · Chest pain.  · Coughing.  · Wheezing.  · Shortness of breath.  Other common symptoms include:   · Night sweats.  · Weight loss.  · Fatigue.  · Depression.  · A sense of uneasiness.  DIAGNOSIS   Sarcoidosis may be diagnosed by:   · Medical history and physical exam.  · Chest X-ray. This looks for granulomas in your lungs.  · Lung function tests. These measure your breathing and look for problems related to sarcoidosis.  · Examining a sample of tissue under a microscope (biopsy).  TREATMENT   Sarcoidosis usually clears up without treatment. You may take medicines to reduce inflammation or relieve symptoms. These may include:  · Prednisone. This steroid reduces inflammation related to sarcoidosis.  · Chloroquine or hydroxychloroquine. These are antimalarial medicines used to  treat sarcoidosis that affects the skin or brain.  · Methotrexate, leflunomide, or azathioprine. These medicines affect the immune system and can help with sarcoidosis in the joints, eyes, skin, or lungs.  · Inhalers. Inhaled medicines can help you breathe if sarcoidosis is affecting your lungs.  HOME CARE INSTRUCTIONS  · Do not use any tobacco products, including cigarettes, chewing tobacco, or electronic cigarettes. If you need help quitting, ask your health care provider.  · Avoid secondhand smoke.  · Avoid irritating dust and chemicals. Stay indoors on days when air quality is poor in your area.  · Take medicines only as directed by your health care provider.  SEEK MEDICAL CARE IF:  · You have vision problems.  · You have shortness of breath.  · You have a dry, persistent cough.  · You have an irregular heartbeat.  · You have pain or ache in your joints, hands, or feet.  · You have an unexplained rash.  SEEK IMMEDIATE MEDICAL CARE IF:   You have chest pain.     This information is not intended to replace advice given to you by your health care provider. Make sure you discuss any questions you have with your health care provider.     Document Released: 09/17/2004 Document Revised: 12/08/2014 Document Reviewed: 03/15/2014  Elsevier Interactive Patient Education ©2016 Elsevier Inc.

## 2015-12-31 NOTE — Progress Notes (Signed)
Reason for visit: Headache  [name] [name] is an 50 y.o. female  History of present illness:  [name] [name] is a 50 year old right-handed black female with a history of neurosarcoidosis. The patient has had very stable MRI brain evaluations in the past. He is on low-dose methotrexate, she continues to have fairly frequent headaches. The patient has noted some increase in headaches recently. She has hydrocodone to take if needed. She has also been bothered by low back and left hip discomfort, and pain down the left leg. She indicates that the pain is worse when she is lying down in bed at night, she is not sleeping well because of this. She feels somewhat better when she is up and moving. The patient has had difficulty with chronic constipation, she has had a GI workup, she was told she may need to have a colectomy. She does have some back pain when the constipation is worse, but she does not correlate the leg discomfort with this issue. She has had CT evaluation of the left hip that did not show significant hip arthritis, there was some mild involvement of the left SI joint. She returns for an evaluation.  Past Medical History  Diagnosis Date  . Sarcoidosis ([place])   . OSA (obstructive sleep apnea)   . Prolonged QT interval   . Osteoporosis   . Anemia   . GERD (gastroesophageal reflux disease)   . DM (diabetes mellitus) ([place])   . Peripheral neuropathy ([place])   . HTN (hypertension)   . Nephrolithiasis   . Chronic fatigue   . Obesity   . Headache(784.0)   . Gait disturbance     Past Surgical History  Procedure Laterality Date  . Hysteroscopy    . Dilation and curettage of uterus    . Partial hysterectomy  2013  . Suboccipital craniotomy    . Thyroidectomy      Precancerous lesion  . Rotator cuff repair Right     07/2013    Family History  Problem Relation Age of Onset  . Heart disease Mother     questionable CAD and arrythmias   . Cancer Mother     Breast cancer  . Venous  thrombosis Sister   . Diabetes Sister   . Heart disease Sister   . Diabetes Brother   . Heart disease Brother   . Cancer Maternal Aunt   . Venous thrombosis Sister   . Diabetes Sister   . Heart disease Sister     Social history:  reports that she has never smoked. She has never used smokeless tobacco. She reports that she does not drink alcohol or use illicit drugs.   No Known Allergies  Medications:  Prior to Admission medications   Medication Sig Start Date End Date Taking? Authorizing Provider  aspirin 81 MG tablet Take 81 mg by mouth at bedtime.    Yes Historical Provider, MD  atorvastatin (LIPITOR) 20 MG tablet TAKE 1 TABLET (20 MG TOTAL) BY MOUTH EVERY EVENING. 09/11/15  Yes Collier Salina, MD  diclofenac sodium (VOLTAREN) 1 % GEL Apply 2 g topically 4 (four) times daily. 12/06/14  Yes Jones Bales, MD  folic acid (FOLVITE) 1 MG tablet Take 1 tablet (1 mg total) by mouth daily. 09/07/15 09/06/16 Yes Collier Salina, MD  HYDROcodone-acetaminophen (NORCO/VICODIN) 5-325 MG tablet Take 1 tablet by mouth every 6 (six) hours as needed for moderate pain. For headache 12/20/15  Yes Ward Givens, NP  levothyroxine (SYNTHROID, LEVOTHROID) 137  MCG tablet TAKE 1 TABLET BY MOUTH EVERY DAY BEFORE BREAKFAST 12/20/15  Yes Collier Salina, MD  losartan (COZAAR) 50 MG tablet Take 50 mg by mouth daily. 11/13/15  Yes Historical Provider, MD  meloxicam (MOBIC) 7.5 MG tablet Take 2 tablets (15 mg total) by mouth daily. 12/21/15  Yes Iline Oven, MD  metFORMIN (GLUCOPHAGE) 1000 MG tablet Take 1 tablet (1,000 mg total) by mouth 2 (two) times daily with a meal. 09/07/15  Yes Collier Salina, MD  Multiple Vitamin (MULTIVITAMIN WITH MINERALS) TABS tablet Take 1 tablet by mouth every evening.    Yes Historical Provider, MD  naproxen (NAPROSYN) 500 MG tablet Take 1 tablet (500 mg total) by mouth 2 (two) times daily with a meal. 11/07/15 11/06/16 Yes Collier Salina, MD  RHEUMATREX 2.5 MG  tablet TAKE 4 TABLETS (10 MG TOTAL) BY MOUTH ONCE A WEEK. 10/24/15  Yes Marcial Pacas, MD    ROS:  Out of a complete 14 system review of symptoms, the patient complains only of the following symptoms, and all other reviewed systems are negative.  Swollen abdomen, abdominal pain, constipation, nausea Leg swelling Insomnia, frequent waking Headache  Blood pressure 158/93, pulse 80, height 5\' 8"  (1.727 m), weight 216 lb 8 oz (98.204 kg), last menstrual period 12/03/2011.  Physical Exam  General: The patient is alert and cooperative at the time of the examination. The patient is moderately obese.  Neuromuscular: Range of movement of the low back is relatively full. The patient has minimal tenderness with palpation the low back. No significant tenderness with palpation over the SI joints is noted on either side.  Skin: No significant peripheral edema is noted.   Neurologic Exam  Mental status: The patient is alert and oriented x 3 at the time of the examination. The patient has apparent normal recent and remote memory, with an apparently normal attention span and concentration ability.   Cranial nerves: Facial symmetry is present. Speech is normal, no aphasia or dysarthria is noted. Extraocular movements are full. Visual fields are full.  Motor: The patient has good strength in all 4 extremities. The patient is able to walk on heels and the toes bilaterally.  Sensory examination: Soft touch sensation is symmetric on the face, arms, and legs. Vibration sensation is symmetric throughout.  Coordination: The patient has good finger-nose-finger and heel-to-shin bilaterally.  Gait and station: The patient has a normal gait. Tandem gait is unsteady. Romberg is negative. No drift is seen.  Reflexes: Deep tendon reflexes are symmetric, but are depressed.   MRI brain 09/28/15:  IMPRESSION:  Abnormal MRI brain (with and without) demonstrating: 1. Stable foci of non-specific gliosis in the  right frontal subcortical region and adjacent to the 4th ventricle on the left side. These are stable and unchanged from MRIs on 04/15/14 and back to 01/14/09.  2. No abnormal enhancing lesions or acute findings.  * MRI scan images were reviewed online. I agree with the written report.    Assessment/Plan:  1. Neurosarcoidosis  2. Chronic constipation  3. Left hip and leg discomfort  4. Chronic frequent headache  The patient will be placed on gabapentin for the headache and for the low back pain. She will be set up for nerve conduction studies on both legs, EMG of the left leg. We may consider MRI evaluation of the low back in the future. The patient is having increasing discomfort involving the left leg, this is keeping her awake at night. She will follow-up for  the EMG evaluation. She will have blood work done today. A prescription was called in for the gabapentin.  Jill Alexanders MD 12/31/2015 12:34 PM  [facility] [address] [place], [place] [address]  Phone [phone] Fax [phone]

## 2016-01-01 ENCOUNTER — Encounter: Payer: Self-pay | Admitting: Neurology

## 2016-01-01 ENCOUNTER — Encounter: Payer: Self-pay | Admitting: Internal Medicine

## 2016-01-01 ENCOUNTER — Encounter (INDEPENDENT_AMBULATORY_CARE_PROVIDER_SITE_OTHER): Payer: Self-pay

## 2016-01-01 ENCOUNTER — Ambulatory Visit (INDEPENDENT_AMBULATORY_CARE_PROVIDER_SITE_OTHER): Payer: Medicaid Other | Admitting: Neurology

## 2016-01-01 ENCOUNTER — Ambulatory Visit (INDEPENDENT_AMBULATORY_CARE_PROVIDER_SITE_OTHER): Payer: Self-pay | Admitting: Neurology

## 2016-01-01 ENCOUNTER — Ambulatory Visit (INDEPENDENT_AMBULATORY_CARE_PROVIDER_SITE_OTHER): Payer: Medicaid Other | Admitting: Dietician

## 2016-01-01 ENCOUNTER — Ambulatory Visit (INDEPENDENT_AMBULATORY_CARE_PROVIDER_SITE_OTHER): Payer: Medicaid Other | Admitting: Internal Medicine

## 2016-01-01 ENCOUNTER — Telehealth: Payer: Self-pay | Admitting: Neurology

## 2016-01-01 VITALS — BP 160/95 | HR 87 | Temp 98.1°F | Ht 68.0 in | Wt 212.6 lb

## 2016-01-01 DIAGNOSIS — G8929 Other chronic pain: Secondary | ICD-10-CM

## 2016-01-01 DIAGNOSIS — K5900 Constipation, unspecified: Secondary | ICD-10-CM

## 2016-01-01 DIAGNOSIS — R1013 Epigastric pain: Secondary | ICD-10-CM | POA: Diagnosis not present

## 2016-01-01 DIAGNOSIS — M25552 Pain in left hip: Secondary | ICD-10-CM

## 2016-01-01 DIAGNOSIS — K5901 Slow transit constipation: Secondary | ICD-10-CM

## 2016-01-01 DIAGNOSIS — R1084 Generalized abdominal pain: Secondary | ICD-10-CM

## 2016-01-01 DIAGNOSIS — Z713 Dietary counseling and surveillance: Secondary | ICD-10-CM

## 2016-01-01 DIAGNOSIS — E119 Type 2 diabetes mellitus without complications: Secondary | ICD-10-CM

## 2016-01-01 DIAGNOSIS — D8689 Sarcoidosis of other sites: Secondary | ICD-10-CM

## 2016-01-01 LAB — CBC WITH DIFFERENTIAL/PLATELET
BASOS: 1 %
Basophils Absolute: 0 10*3/uL (ref 0.0–0.2)
EOS (ABSOLUTE): 0.1 10*3/uL (ref 0.0–0.4)
EOS: 2 %
HEMOGLOBIN: 12.8 g/dL (ref 11.1–15.9)
Hematocrit: 39.9 % (ref 34.0–46.6)
IMMATURE GRANULOCYTES: 0 %
Immature Grans (Abs): 0 10*3/uL (ref 0.0–0.1)
LYMPHS: 35 %
Lymphocytes Absolute: 1.6 10*3/uL (ref 0.7–3.1)
MCH: 27.6 pg (ref 26.6–33.0)
MCHC: 32.1 g/dL (ref 31.5–35.7)
MCV: 86 fL (ref 79–97)
MONOCYTES: 6 %
Monocytes Absolute: 0.3 10*3/uL (ref 0.1–0.9)
Neutrophils Absolute: 2.5 10*3/uL (ref 1.4–7.0)
Neutrophils: 56 %
PLATELETS: 294 10*3/uL (ref 150–379)
RBC: 4.63 x10E6/uL (ref 3.77–5.28)
RDW: 14.7 % (ref 12.3–15.4)
WBC: 4.4 10*3/uL (ref 3.4–10.8)

## 2016-01-01 LAB — COMPREHENSIVE METABOLIC PANEL
ALT: 20 IU/L (ref 0–32)
AST: 21 IU/L (ref 0–40)
Albumin/Globulin Ratio: 1.5 (ref 1.1–2.5)
Albumin: 4.4 g/dL (ref 3.5–5.5)
Alkaline Phosphatase: 77 IU/L (ref 39–117)
BUN/Creatinine Ratio: 20 (ref 9–23)
BUN: 13 mg/dL (ref 6–24)
Bilirubin Total: <0.2 mg/dL (ref 0.0–1.2)
CALCIUM: 9 mg/dL (ref 8.7–10.2)
CO2: 22 mmol/L (ref 18–29)
Chloride: 103 mmol/L (ref 96–106)
Creatinine, Ser: 0.64 mg/dL (ref 0.57–1.00)
GFR, EST AFRICAN AMERICAN: 122 mL/min/{1.73_m2} (ref >59)
GFR, EST NON AFRICAN AMERICAN: 106 mL/min/{1.73_m2} (ref >59)
Globulin, Total: 2.9 g/dL (ref 1.5–4.5)
Glucose: 123 mg/dL — ABNORMAL HIGH (ref 65–99)
Potassium: 4.3 mmol/L (ref 3.5–5.2)
Sodium: 141 mmol/L (ref 134–144)
TOTAL PROTEIN: 7.3 g/dL (ref 6.0–8.5)

## 2016-01-01 MED ORDER — SENNOSIDES-DOCUSATE SODIUM 8.6-50 MG PO TABS: 2.0000 | ORAL_TABLET | Freq: Two times a day (BID) | ORAL | Status: DC

## 2016-01-01 NOTE — Patient Instructions (Signed)
1. Continue Gabapentin as prescribed.  Follow up with neurology. 2. Take Benadryl 25-50 mg nightly to help with sleep. 3. Return to clinic 1 month.  Diphenhydramine capsules or tablets What is this medicine? DIPHENHYDRAMINE (dye fen HYE dra meen) is an antihistamine. It is used to treat the symptoms of an allergic reaction. It is also used to treat Parkinson's disease. This medicine is also used to prevent and to treat motion sickness and as a nighttime sleep aid. This medicine may be used for other purposes; ask your health care provider or pharmacist if you have questions. What should I tell my health care provider before I take this medicine? They need to know if you have any of these conditions: -asthma or lung disease -glaucoma -high blood pressure or heart disease -liver disease -pain or difficulty passing urine -prostate trouble -ulcers or other stomach problems -an unusual or allergic reaction to diphenhydramine, other medicines foods, dyes, or preservatives such as sulfites -pregnant or trying to get pregnant -breast-feeding How should I use this medicine? Take this medicine by mouth with a full glass of water. Follow the directions on the prescription label. Take your doses at regular intervals. Do not take your medicine more often than directed. To prevent motion sickness start taking this medicine 30 to 60 minutes before you leave. Talk to your pediatrician regarding the use of this medicine in children. Special care may be needed. Patients over 12 years old may have a stronger reaction and need a smaller dose. Overdosage: If you think you have taken too much of this medicine contact a poison control center or emergency room at once. NOTE: This medicine is only for you. Do not share this medicine with others. What if I miss a dose? If you miss a dose, take it as soon as you can. If it is almost time for your next dose, take only that dose. Do not take double or extra  doses. What may interact with this medicine? Do not take this medicine with any of the following medications: -MAOIs like Carbex, Eldepryl, Marplan, Nardil, and Parnate This medicine may also interact with the following medications: -alcohol -barbiturates, like phenobarbital -medicines for bladder spasm like oxybutynin, tolterodine -medicines for blood pressure -medicines for depression, anxiety, or psychotic disturbances -medicines for movement abnormalities or Parkinson's disease -medicines for sleep -other medicines for cold, cough or allergy -some medicines for the stomach like chlordiazepoxide, dicyclomine This list may not describe all possible interactions. Give your health care provider a list of all the medicines, herbs, non-prescription drugs, or dietary supplements you use. Also tell them if you smoke, drink alcohol, or use illegal drugs. Some items may interact with your medicine. What should I watch for while using this medicine? Visit your doctor or health care professional for regular check ups. Tell your doctor if your symptoms do not improve or if they get worse. Your mouth may get dry. Chewing sugarless gum or sucking hard candy, and drinking plenty of water may help. Contact your doctor if the problem does not go away or is severe. This medicine may cause dry eyes and blurred vision. If you wear contact lenses you may feel some discomfort. Lubricating drops may help. See your eye doctor if the problem does not go away or is severe. You may get drowsy or dizzy. Do not drive, use machinery, or do anything that needs mental alertness until you know how this medicine affects you. Do not stand or sit up quickly, especially if you are  an older patient. This reduces the risk of dizzy or fainting spells. Alcohol may interfere with the effect of this medicine. Avoid alcoholic drinks. What side effects may I notice from receiving this medicine? Side effects that you should report to  your doctor or health care professional as soon as possible: -allergic reactions like skin rash, itching or hives, swelling of the face, lips, or tongue -changes in vision -confused, agitated, nervous -irregular or fast heartbeat -tremor -trouble passing urine -unusual bleeding or bruising -unusually weak or tired Side effects that usually do not require medical attention (report to your doctor or health care professional if they continue or are bothersome): -constipation, diarrhea -drowsy -headache -loss of appetite -stomach upset, vomiting -thick mucous This list may not describe all possible side effects. Call your doctor for medical advice about side effects. You may report side effects to FDA at 1-800-FDA-1088. Where should I keep my medicine? Keep out of the reach of children. Store at room temperature between 15 and 30 degrees C (59 and 86 degrees F). Keep container closed tightly. Throw away any unused medicine after the expiration date. NOTE: This sheet is a summary. It may not cover all possible information. If you have questions about this medicine, talk to your doctor, pharmacist, or health care provider.    2016, Elsevier/Gold Standard. (2008-03-06 17:06:22)

## 2016-01-01 NOTE — Procedures (Signed)
     HISTORY:  [name] [name] is a 50 year old patient with a history of neurosarcoidosis who reports a long duration issue with left back, hip and leg discomfort. The patient is being evaluated for the pain to exclude the possibility of a lumbosacral radiculopathy.  NERVE CONDUCTION STUDIES:  Nerve conduction studies were performed on both lower extremities. The distal motor latencies and motor amplitudes for the peroneal and posterior tibial nerves were within normal limits. The nerve conduction velocities for these nerves were also normal. The H reflex latencies were normal. The sensory latencies for the peroneal nerves were within normal limits.   EMG STUDIES:  EMG study was performed on the left lower extremity:  The tibialis anterior muscle reveals 2 to 4K motor units with full recruitment. No fibrillations or positive waves were seen. The peroneus tertius muscle reveals 2 to 4K motor units with full recruitment. No fibrillations or positive waves were seen. The medial gastrocnemius muscle reveals 1 to 3K motor units with full recruitment. No fibrillations or positive waves were seen. The vastus lateralis muscle reveals 2 to 4K motor units with full recruitment. No fibrillations or positive waves were seen. The iliopsoas muscle reveals 2 to 4K motor units with full recruitment. No fibrillations or positive waves were seen. The biceps femoris muscle (long head) reveals 2 to 4K motor units with full recruitment. No fibrillations or positive waves were seen. The lumbosacral paraspinal muscles were tested at 3 levels, and revealed no abnormalities of insertional activity at all 3 levels tested. There was good relaxation.   IMPRESSION:  Nerve conduction studies done on both lower extremities were within normal limits. No evidence of a peripheral neuropathy is seen. EMG evaluation of the left lower extremity was unremarkable, without evidence of an overlying lumbosacral  radiculopathy.  Jill Alexanders MD 01/01/2016 11:05 AM  [facility] [address] [place], [place] [address]  Phone [phone] Fax [phone]

## 2016-01-01 NOTE — Telephone Encounter (Signed)
FYI- Pt had NCV/EMG today and left without checking out.  Upon looking at the appts for the day I noticed that, and her notes say 5 month f/u.  I called and LVM for the pt to schedule this appt.

## 2016-01-01 NOTE — Assessment & Plan Note (Addendum)
Patient returns for f/u left hip pain.  She was seen by Neurology 1/30 where NCS and EMG were normal.  She was started on Gabapentin with scheduled follow up in 2-3 weeks.   Neurology is planning to order MRI Lumbar spine as part of work up. She denies benefit from 2 weeks of Meloxicam 15 mg daily with Tylenol 1-2 g TID PRN.  She understands Gabapentin may take time to work and can be further titrated if it is not effective.  If neurology workup does not identify source, patient may still be tried on Salsalate.  The pain is also preventing her from sleep and she would like a refill of her Ambien.  She has not tried benadryl.  A/P: Chronic left hip pain. Etiology unclear. She is currently being worked up by neurology for possible radicular source, though NCS negative.  If patient fails gabapentin, she may be tried on salsalate. - CTM - Benadryl 25-50 mg qHS PRN

## 2016-01-01 NOTE — Progress Notes (Signed)
Patient ID: [name] [name], female   DOB: Jun 24, 1966, 50 y.o.   MRN: [id number]   Subjective:   Patient ID: [name] [name] female   DOB: January 29, 1966 50 y.o.   MRN: [id number]  HPI: [name] [name] is a 50 y.o. female with PMH as below, here for f/u hip pain.  Please see Problem-Based charting for the status of the patient's chronic medical issues.   She denies CP, SOB, or changes in her hip pain. She endorses constipation.   Past Medical History  Diagnosis Date  . Sarcoidosis ([place])   . OSA (obstructive sleep apnea)   . Prolonged QT interval   . Osteoporosis   . Anemia   . GERD (gastroesophageal reflux disease)   . DM (diabetes mellitus) ([place])   . Peripheral neuropathy ([place])   . HTN (hypertension)   . Nephrolithiasis   . Chronic fatigue   . Obesity   . Headache(784.0)   . Gait disturbance    Current Outpatient Prescriptions  Medication Sig Dispense Refill  . aspirin 81 MG tablet Take 81 mg by mouth at bedtime.     Marland Kitchen atorvastatin (LIPITOR) 20 MG tablet TAKE 1 TABLET (20 MG TOTAL) BY MOUTH EVERY EVENING. 31 tablet 6  . diclofenac sodium (VOLTAREN) 1 % GEL Apply 2 g topically 4 (four) times daily. 1 Tube 0  . folic acid (FOLVITE) 1 MG tablet Take 1 tablet (1 mg total) by mouth daily. 100 tablet 3  . gabapentin (NEURONTIN) 300 MG capsule Take 1 capsule (300 mg total) by mouth 2 (two) times daily. 60 capsule 3  . HYDROcodone-acetaminophen (NORCO/VICODIN) 5-325 MG tablet Take 1 tablet by mouth every 6 (six) hours as needed for moderate pain. For headache 30 tablet 0  . levothyroxine (SYNTHROID, LEVOTHROID) 137 MCG tablet TAKE 1 TABLET BY MOUTH EVERY DAY BEFORE BREAKFAST 60 tablet 2  . losartan (COZAAR) 50 MG tablet Take 50 mg by mouth daily.  11  . meloxicam (MOBIC) 7.5 MG tablet Take 2 tablets (15 mg total) by mouth daily. 10 tablet 0  . metFORMIN (GLUCOPHAGE) 1000 MG tablet Take 1 tablet (1,000 mg total) by mouth 2 (two) times daily with a meal. 120 tablet 2  . Multiple  Vitamin (MULTIVITAMIN WITH MINERALS) TABS tablet Take 1 tablet by mouth every evening.     . naproxen (NAPROSYN) 500 MG tablet Take 1 tablet (500 mg total) by mouth 2 (two) times daily with a meal. 60 tablet 2  . RHEUMATREX 2.5 MG tablet TAKE 4 TABLETS (10 MG TOTAL) BY MOUTH ONCE A WEEK. 48 tablet 0   No current facility-administered medications for this visit.   Family History  Problem Relation Age of Onset  . Heart disease Mother     questionable CAD and arrythmias   . Cancer Mother     Breast cancer  . Venous thrombosis Sister   . Diabetes Sister   . Heart disease Sister   . Diabetes Brother   . Heart disease Brother   . Cancer Maternal Aunt   . Venous thrombosis Sister   . Diabetes Sister   . Heart disease Sister    Social History   Social History  . Marital Status: Married    Spouse Name: N/A  . Number of Children: 4  . Years of Education: 12   Occupational History  .     Social History Main Topics  . Smoking status: Never Smoker   . Smokeless tobacco: Never Used  . Alcohol Use: No  .  Drug Use: No  . Sexual Activity: Not on file     Comment: hysterectomy   Other Topics Concern  . Not on file   Social History Narrative   Lives in [place] with 2 sons and her husband;disabled; no use of tobacco products nor alcohol.   Patient given diabetes card on 12/05/2010.      Patient drinks 2 glasses of tea daily.   Patient is right handed.    Review of Systems: Pertinent items are noted in HPI. Objective:  Physical Exam: There were no vitals filed for this visit. Physical Exam  Constitutional: She is oriented to person, place, and time and well-developed, well-nourished, and in no distress. No distress.  HENT:  Head: Normocephalic and atraumatic.  Eyes: EOM are normal. No scleral icterus.  Cardiovascular: Normal rate, regular rhythm and normal heart sounds.   Pulmonary/Chest: Effort normal and breath sounds normal. No respiratory distress. She has no wheezes.    Neurological: She is alert and oriented to person, place, and time.  Skin: She is not diaphoretic.     Assessment & Plan:   Patient and case were discussed with Dr. Eppie Gibson.  Please refer to Problem Based charting for further documentation.

## 2016-01-01 NOTE — Progress Notes (Signed)
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 She has had some issues with back and left leg discomfort, pain in the left hip. The patient is being evaluated for this discomfort.  Nerve conduction studies on both legs were normal. EMG of the left leg was normal. She has been recently placed on gabapentin, she is to contact me if this is not effective over the next 2 or 3 weeks. We may consider a MRI evaluation the lumbar spine in the future.  She will follow-up in 5 months, sooner if needed.

## 2016-01-01 NOTE — Progress Notes (Signed)
. Diabetes Self-Management Education  Visit Type: First/Initial  Appt. Start Time: 1500 Appt. End Time: 1600  01/01/2016  [name], identified by name and date of birth, is a 50 y.o. female with a diagnosis of Diabetes:  Marland Kitchen Type 2 diabetes ASSESSMENT  Last menstrual period 12/03/2011.  weight reported is 210#, height reported is 68"  BMI.-32- obese Husband is supportive of her weight loss goals, healthy food intake and exercsie      Diabetes Self-Management Education - 01/01/16 1600    Visit Information   Visit Type First/Initial   Health Coping   How would you rate your overall health? Good   Psychosocial Assessment   Patient Belief/Attitude about Diabetes Motivated to manage diabetes   Self-care barriers None   Self-management support Doctor's office;Family;CDE visits   Patient Concerns Nutrition/Meal planning;Weight Control   Special Needs None   Preferred Learning Style No preference indicated   Learning Readiness Ready   How often do you need to have someone help you when you read instructions, pamphlets, or other written materials from your doctor or pharmacy? 1 - Never   Complications   Last HgB A1C per patient/outside source 7.3 %   Have you had a dilated eye exam in the past 12 months? Yes   Have you had a dental exam in the past 12 months? Yes   Are you checking your feet? Yes   How many days per week are you checking your feet? 5   Dietary Intake   Breakfast Herbalife shaek & Tea   Lunch Herbalife protein bar   Dinner rotisserie chicken or tuna or beans or ground [place] with a vegetable, and starch- just bought brown rice and whole grain pasta   Snack (evening) herbalife tea and sometimes a protein bar   Beverage(s) did not discuss   Exercise   Exercise Type ADL's   Patient Education   Previous Diabetes Education Yes (please comment)   Nutrition management  Food label reading, portion sizes and measuring food.;Educated about weight loss using  tracking on Myfitness pal encourage adequate fiber and protein from foods rather than supplements   Physical activity and exercise     Individualized Goals (developed by patient)   Nutrition Weight loss and health intake   Outcomes   Expected Outcomes Demonstrated interest in learning. Expect positive outcomes   Future DMSE PRN   Program Status Not Completed   Subsequent Visit   Since your last visit have you continued or begun to take your medications as prescribed? Yes   Since your last visit have you had your blood pressure checked? Yes   Is your most recent blood pressure lower, unchanged, or higher since your last visit? Unchanged   Since your last visit have you experienced any weight changes? No change      Individualized Plan for Diabetes Self-Management Training:   Learning Objective:  Patient will have a greater understanding of diabetes self-management. Patient education plan is to attend individual and/or group sessions per assessed needs and concerns.   Plan:   Patient Instructions  Calories is like a budget for how much energy your body needs - I estimate you need about 1600-1800 each day for weight loss Protein- estimate- 75-90 grams a day for weight loss Fiber to keep your insides healthy, we all need 25-35 grams of fiber a day.   I recommend MyFitnessPal app for keeping a food record/diary   Expected Outcomes:  Demonstrated interest in learning. Expect positive outcomes Education  material provided: Meal plan card If problems or questions, patient to contact team via:  Phone or email Future DSME appointment: PRN - recommend at least one more visit, but ideally 3-4 visits. She said she'd let me know

## 2016-01-01 NOTE — Assessment & Plan Note (Signed)
Patient reports continued constipation.  She states Dr. Benson Norway is going to get an ultrasound of abdomen, as she also complains of postprandial, sharp, epigastric, pain and some associated nausea.  She reports he may also put a camera in her abdomen to see if she is having gut involvement of her sarcoid.  - Senna 2 tabs BID - Miralax PRN

## 2016-01-01 NOTE — Progress Notes (Signed)
Please refer to EMG and nerve conduction study procedure note. 

## 2016-01-01 NOTE — Patient Instructions (Signed)
Calories is like a budget for how much energy your body needs - I estimate you need about 1600-1800 each day for weight loss Protein- estimate- 75-90 grams a day for weight loss Fiber to keep your insides healthy, we all need 25-35 grams of fiber a day.   I recommend MyFitnessPal app for keeping a food record/diary

## 2016-01-01 NOTE — Progress Notes (Signed)
Case discussed with Dr. Lovena Le at the time of the visit. We reviewed the resident's history and exam and pertinent patient test results. I agree with the assessment, diagnosis, and plan of care documented in the resident's note.  Neurology is pursuing a neurologic explanation for her hip pain.  We will allow this to play out and see if an etiology and/or empiric relief can be achieved.  Her RUQ/epigastric pain is being assessed by GI and a RUQ ultrasound has been ordered.  We will review the results of this study when completed before initiating additional work-up.  Salsalate remains in the anti-inflammatory armamentarium if the gabapentin is ineffective.

## 2016-01-03 ENCOUNTER — Ambulatory Visit
Admission: RE | Admit: 2016-01-03 | Discharge: 2016-01-03 | Disposition: A | Discharge status: Home / Self Care | Payer: Medicaid Other | Source: Ambulatory Visit | Attending: Gastroenterology | Admitting: Gastroenterology

## 2016-01-03 DIAGNOSIS — R1013 Epigastric pain: Secondary | ICD-10-CM

## 2016-01-03 DIAGNOSIS — R110 Nausea: Secondary | ICD-10-CM

## 2016-01-03 NOTE — Telephone Encounter (Signed)
LVM for pt to call back to schedule a 5 month f/u

## 2016-01-09 ENCOUNTER — Other Ambulatory Visit: Payer: Self-pay | Admitting: Gastroenterology

## 2016-01-09 DIAGNOSIS — R110 Nausea: Secondary | ICD-10-CM

## 2016-01-15 ENCOUNTER — Other Ambulatory Visit: Payer: Self-pay | Admitting: Neurology

## 2016-01-16 ENCOUNTER — Telehealth: Payer: Self-pay

## 2016-01-16 ENCOUNTER — Other Ambulatory Visit: Payer: Self-pay | Admitting: Neurology

## 2016-01-16 NOTE — Telephone Encounter (Signed)
Pt has a July appt with Dr. Jannifer Franklin

## 2016-01-17 ENCOUNTER — Ambulatory Visit (HOSPITAL_COMMUNITY)
Admission: RE | Admit: 2016-01-17 | Discharge: 2016-01-17 | Disposition: A | Discharge status: Home / Self Care | Payer: Medicaid Other | Source: Ambulatory Visit | Attending: Gastroenterology | Admitting: Gastroenterology

## 2016-01-17 DIAGNOSIS — R109 Unspecified abdominal pain: Secondary | ICD-10-CM | POA: Insufficient documentation

## 2016-01-17 DIAGNOSIS — E119 Type 2 diabetes mellitus without complications: Secondary | ICD-10-CM | POA: Insufficient documentation

## 2016-01-17 DIAGNOSIS — R112 Nausea with vomiting, unspecified: Secondary | ICD-10-CM | POA: Insufficient documentation

## 2016-01-17 DIAGNOSIS — R110 Nausea: Secondary | ICD-10-CM

## 2016-01-17 MED ORDER — TECHNETIUM TC 99M SULFUR COLLOID
2.1000 | Freq: Once | INTRAVENOUS | Status: AC | PRN
  Administered 2016-01-17: 2.1 via INTRAVENOUS

## 2016-01-22 ENCOUNTER — Other Ambulatory Visit: Payer: Self-pay | Admitting: Internal Medicine

## 2016-02-01 ENCOUNTER — Encounter: Payer: Medicaid Other | Admitting: Dietician

## 2016-02-01 ENCOUNTER — Ambulatory Visit (INDEPENDENT_AMBULATORY_CARE_PROVIDER_SITE_OTHER): Payer: Medicaid Other | Admitting: Internal Medicine

## 2016-02-01 ENCOUNTER — Other Ambulatory Visit: Payer: Self-pay | Admitting: Oncology

## 2016-02-01 ENCOUNTER — Telehealth: Payer: Self-pay | Admitting: Neurology

## 2016-02-01 ENCOUNTER — Ambulatory Visit (INDEPENDENT_AMBULATORY_CARE_PROVIDER_SITE_OTHER): Payer: Medicaid Other | Admitting: Dietician

## 2016-02-01 ENCOUNTER — Encounter: Payer: Self-pay | Admitting: Internal Medicine

## 2016-02-01 VITALS — Ht 68.0 in | Wt 212.0 lb

## 2016-02-01 VITALS — BP 143/74 | HR 73 | Temp 97.9°F | Ht 68.0 in | Wt 212.0 lb

## 2016-02-01 DIAGNOSIS — E119 Type 2 diabetes mellitus without complications: Secondary | ICD-10-CM

## 2016-02-01 DIAGNOSIS — G8929 Other chronic pain: Secondary | ICD-10-CM | POA: Diagnosis present

## 2016-02-01 DIAGNOSIS — K219 Gastro-esophageal reflux disease without esophagitis: Secondary | ICD-10-CM | POA: Diagnosis not present

## 2016-02-01 DIAGNOSIS — M1612 Unilateral primary osteoarthritis, left hip: Secondary | ICD-10-CM

## 2016-02-01 DIAGNOSIS — M25552 Pain in left hip: Secondary | ICD-10-CM

## 2016-02-01 DIAGNOSIS — Z7984 Long term (current) use of oral hypoglycemic drugs: Secondary | ICD-10-CM

## 2016-02-01 DIAGNOSIS — L52 Erythema nodosum: Secondary | ICD-10-CM | POA: Diagnosis not present

## 2016-02-01 LAB — POCT GLYCOSYLATED HEMOGLOBIN (HGB A1C): Hemoglobin A1C: 7.8

## 2016-02-01 LAB — GLUCOSE, CAPILLARY: Glucose-Capillary: 100 mg/dL — ABNORMAL HIGH (ref 65–99)

## 2016-02-01 MED ORDER — DICLOFENAC SODIUM 1 % TD GEL: 2.0000 g | Freq: Four times a day (QID) | TRANSDERMAL | Status: DC

## 2016-02-01 MED ORDER — PANTOPRAZOLE SODIUM 20 MG PO TBEC: 20.0000 mg | DELAYED_RELEASE_TABLET | Freq: Every day | ORAL | Status: DC

## 2016-02-01 NOTE — Progress Notes (Signed)
Diabetes Self-Management Education  Visit Type:  Follow-up  Appt. Start Time: 140 Appt. End Time: [id number]  02/01/2016  [name], identified by name and date of birth, is a 50 y.o. female with a diagnosis of Diabetes:  Marland Kitchen  Type 2 diabetes [name] [name] says she has been told her colon needs to come out an she has a fatty liver. Her weight is the same although she does not think she eats 1600 calories/day. She did onto access MyFitness pal nor seems interested today in that or keeping food records.   ASSESSMENT  Height 5\' 8"  (1.727 m), weight 212 lb (96.163 kg), last menstrual period 12/03/2011. Body mass index is 32.24 kg/(m^2).       Diabetes Self-Management Education - 02/01/16 1400    Psychosocial Assessment   Patient Belief/Attitude about Diabetes Motivated to manage diabetes   Self-care barriers None   Self-management support Doctor's office;Friends;Family;CDE visits   Patient Concerns Nutrition/Meal planning;Medication;Glycemic Control;Weight Control   Special Needs None   Preferred Learning Style No preference indicated   Learning Readiness Ready   Dietary Intake   Beverage(s) --  water, spring water, no alcohol, tea-herbalife   Exercise   Exercise Type Moderate (swimming / aerobic walking)   How many days per week to you exercise? --  3   How many minutes per day do you exercise? --  60   Patient Education   Nutrition management  Meal options for control of blood glucose level and chronic complications.;Other (comment)   Medications Reviewed patients medication for diabetes, action, purpose, timing of dose and side effects.   Monitoring Interpreting lab values - A1C, lipid, urine microalbumina.   Individualized Goals (developed by patient)   Nutrition Eat at least 25-35 grams fiber a day   Patient Self-Evaluation of Goals - Patient rates self as meeting previously set goals (% of time)   Nutrition >75%   Outcomes   Program Status Not Completed   Subsequent Visit    Since your last visit have you continued or begun to take your medications as prescribed? Yes   Since your last visit have you had your blood pressure checked? No   Is your most recent blood pressure lower, unchanged, or higher since your last visit? Lower   Since your last visit have you experienced any weight changes? No change   Since your last visit, are you checking your blood glucose at least once a day? No      Learning Objective:  Patient will have a greater understanding of diabetes self-management. Patient education plan: Patient individualized diabetes plan discussed today with patient and includes: Nutrition, medications, monitoring, physical activity My plan to support myself in continuing these changes to care for my diabetes is to attend or contact:  Exercise ? Add local gym and fitness center as an option ? Other: Diabetes Support Groups Type 2 diabetes support group : 2nd Monday of every month from 6-7 PM at [address] [facility] conference room [phone] Other ? Add other local support resources -doctor's office, CDE, Dietitian, pharmacist, church Let your educator know if you need help accessing the websites.  Plan:   Patient Instructions  You are doing a great job being active and eating more FIBER.  Both these lifestyle chanes should help your weight, your bowels and your blood sugars!  I recommend you make and appointment in 4-8 weeks to see how your weight and bowels are doing.  Call anytime!  [name] [name] [phone]  Expected Outcomes:  Demonstrated interest in learning. Expect positive outcomes  Education material provided: Living Well with Diabetes  If problems or questions, patient to contact team via:  Phone  Future DSME appointment: -   4-8 weeks

## 2016-02-01 NOTE — Assessment & Plan Note (Signed)
History:  She has had long-standing left inguinal pain , that is only mildly relieved by full-term gel. She says this feels a little better when she wears close the fit more loosely.  Assessment:  She's had an extensive workup for this mysterious hip pain; given the history I can't help but wonder if this is meralgia paresthetica. I don't feel an inguinal hernia in the area.  Plan:  I recommended she wear looser clothes, and have refilled her Voltaren gel today.

## 2016-02-01 NOTE — Telephone Encounter (Signed)
Patient paged the on-call doctor Friday at 5:04pm. Said she has not been able to get her Rheumatrex for 2 weeks. The pharmacy has told her it is a special order and they can't get it. I advised her she should call us sooner when something like this happens. She asked to have Dr. Jannifer Franklin call her on Monday and she says she also has some other things to talk to Dr. Jannifer Franklin about.

## 2016-02-01 NOTE — Assessment & Plan Note (Signed)
History:  She complains of recurrent bruises on her anterior shins have been going on for the last few months.  Assessment:  The tender purpuric nodules on her anterior shins appear clinically consistent with erythema nodosum. I think this is most likely from her sarcoidosis.  She is not on birth control, and does not have any tuberculosis exposure.  Plan:  I educated her on erythema nodosum and told her that we can try NSAIDs in the future, should they become more bothersome.

## 2016-02-01 NOTE — Assessment & Plan Note (Signed)
History:  She is been cutting out sodas , and started exercising last month. She has been meeting with Debera Lat, which is been very helpful.  Assessment:  Her A1c today was 7.8 on metformin 1000 mg daily.  Plan: I've recommended she start taking metformin 1000 mg twice daily. I think that her diet and exercise will bring her A1c down considerably as she's very motivated to lose weight.

## 2016-02-01 NOTE — Assessment & Plan Note (Signed)
I re-filled her pantoprazole today. She denies any alarm symptoms.

## 2016-02-01 NOTE — Patient Instructions (Addendum)
Ms. Wehmeyer,  It was very nice to meet you today!  Here's what we discussed today:  1. I've refilled your full turn gel that will hopefully help with her left hip pain.  2.  The bruises on her legs were called "erythema nodosum,"  Which will likely come and go. If these gets severely worse, don't hesitate to give [name] a call, and we can start you on some steroids. Unfortunately, these will worsen your diabetes and worsen your sleep as well.  3.  For your acid reflux, I've refilled your Protonix.  4. For your diabetes, your a1c was 7.8 today; our goal is less than 7. Start taking the metformin 1000mg  TWICE daily and we'll see you back in 3 weeks to check it again.  You doing an outstanding job exercising and watching her diet. Keep up the excellent work! I'm sure we can get your diabetes under control.  Take care, we will see you in 3 months, Dr. Melburn Hake

## 2016-02-01 NOTE — Progress Notes (Signed)
Medicine attending: Medical history, presenting problems, physical findings, and medications, reviewed with resident physician Dr Kyle Flores on the day of the patient visit and I concur with his evaluation and management plan. 

## 2016-02-01 NOTE — Progress Notes (Signed)
Patient ID: [name] [name], female   DOB: 11/01/1966, 50 y.o.   MRN: [id number] [facility] Subjective:   Patient ID: [name] [name] female   DOB: [birth year] 50 y.o.   MRN: [id number]  HPI: [name] is a 50 y.o. female with sarcoidosis, hypothyroidism, and type 2 diabetes presenting to clinic for follow-up of diabetes, left hip pain, and bruises on her shins.  I reviewed her medications and she is not currently smoking.  Please see the assessment and plan for the status of the patient's chronic medical problems.  Review of Systems  Constitutional: Negative for fever, chills, weight loss and malaise/fatigue.  Respiratory: Negative for shortness of breath.   Cardiovascular: Negative for chest pain and leg swelling.  Musculoskeletal: Positive for myalgias.  Skin: Positive for rash.  Psychiatric/Behavioral: Negative for depression. The patient is not nervous/anxious.    Objective:  Physical Exam: Filed Vitals:   02/01/16 1335  BP: 143/74  Pulse: 73  Temp: 97.9 F (36.6 C)  TempSrc: Oral  Height: 5\' 8"  (1.727 m)  Weight: 212 lb (96.163 kg)  SpO2: 100%   General: resting in chair comfortably, appropriately conversational Cardiac: regular rate and rhythm, no rubs, murmurs or gallops Pulm: breathing well, clear to auscultation bilaterally Ext: left hip with full range of motion, no inguinal hernia Lymph: no cervical or supraclavicular lymphadenopathy Skin: tender purpuric nodules over anterior shins   Assessment & Plan:  Case discussed with Dr. Beryle Beams  Chronic left hip pain History:  She has had long-standing left inguinal pain , that is only mildly relieved by full-term gel. She says this feels a little better when she wears close the fit more loosely.  Assessment:  She's had an extensive workup for this mysterious hip pain; given the history I can't help but wonder if this is meralgia paresthetica. I don't feel an inguinal hernia in  the area.  Plan:  I recommended she wear looser clothes, and have refilled her Voltaren gel today.  Diabetes mellitus type 2, controlled History:  She is been cutting out sodas , and started exercising last month. She has been meeting with Debera Lat, which is been very helpful.  Assessment:  Her A1c today was 7.8 on metformin 1000 mg daily.  Plan: I've recommended she start taking metformin 1000 mg twice daily. I think that her diet and exercise will bring her A1c down considerably as she's very motivated to lose weight.  GERD I re-filled her pantoprazole today. She denies any alarm symptoms.  Erythema nodosum History:  She complains of recurrent bruises on her anterior shins have been going on for the last few months.  Assessment:  The tender purpuric nodules on her anterior shins appear clinically consistent with erythema nodosum. I think this is most likely from her sarcoidosis.  She is not on birth control, and does not have any tuberculosis exposure.  Plan:  I educated her on erythema nodosum and told her that we can try NSAIDs in the future, should they become more bothersome.   Medications Ordered Meds ordered this encounter  Medications  . diclofenac sodium (VOLTAREN) 1 % GEL    Sig: Apply 2 g topically 4 (four) times daily.    Dispense:  1 Tube    Refill:  0  . pantoprazole (PROTONIX) 20 MG tablet    Sig: Take 1 tablet (20 mg total) by mouth daily.    Dispense:  30 tablet    Refill:  1   Other  Orders Orders Placed This Encounter  Procedures  . POC Hbg A1C   Follow Up: Return in about 3 months (around 05/03/2016).

## 2016-02-01 NOTE — Patient Instructions (Signed)
You are doing a great job being active and eating more FIBER.  Both these lifestyle chanes should help your weight, your bowels and your blood sugars!  I recommend you make and appointment in 4-8 weeks to see how your weight and bowels are doing.  Call anytime!  Butch Penny [phone]

## 2016-02-03 NOTE — Telephone Encounter (Signed)
i called the patient and I left a message. I will call back in the morning.

## 2016-02-04 MED ORDER — AZATHIOPRINE 50 MG PO TABS: 50.0000 mg | ORAL_TABLET | Freq: Two times a day (BID) | ORAL | Status: DC

## 2016-02-04 NOTE — Telephone Encounter (Signed)
I called patient. Apparently there is a recall for methotrexate, the patient is unable to get the drug. I will switch her to Imuran 50 mg twice daily. The patient reports a rash around the hip, looks like a bruise. She was told that this likely is related to the sarcoidosis, with skin involvement. She may try a topical cortisone cream for this. This is not effective, I will call in a triamcinolone cream. This should not affect her diabetes much. The patient has been out of the methotrexate only a few days.

## 2016-02-11 ENCOUNTER — Other Ambulatory Visit: Payer: Self-pay | Admitting: Internal Medicine

## 2016-02-15 ENCOUNTER — Encounter: Payer: Medicaid Other | Admitting: Internal Medicine

## 2016-02-18 ENCOUNTER — Encounter: Payer: Self-pay | Admitting: Internal Medicine

## 2016-02-18 ENCOUNTER — Other Ambulatory Visit (HOSPITAL_COMMUNITY)
Admission: RE | Admit: 2016-02-18 | Discharge: 2016-02-18 | Disposition: A | Discharge status: Home / Self Care | Payer: Medicaid Other | Source: Ambulatory Visit | Attending: Internal Medicine | Admitting: Internal Medicine

## 2016-02-18 ENCOUNTER — Ambulatory Visit (INDEPENDENT_AMBULATORY_CARE_PROVIDER_SITE_OTHER): Payer: Medicaid Other | Admitting: Internal Medicine

## 2016-02-18 VITALS — BP 154/89 | HR 95 | Temp 98.2°F | Resp 18 | Ht 68.0 in | Wt 213.5 lb

## 2016-02-18 DIAGNOSIS — N760 Acute vaginitis: Secondary | ICD-10-CM | POA: Insufficient documentation

## 2016-02-18 DIAGNOSIS — Z01419 Encounter for gynecological examination (general) (routine) without abnormal findings: Secondary | ICD-10-CM | POA: Diagnosis present

## 2016-02-18 DIAGNOSIS — Z0000 Encounter for general adult medical examination without abnormal findings: Secondary | ICD-10-CM | POA: Insufficient documentation

## 2016-02-18 DIAGNOSIS — Z113 Encounter for screening for infections with a predominantly sexual mode of transmission: Secondary | ICD-10-CM | POA: Insufficient documentation

## 2016-02-18 DIAGNOSIS — Z1151 Encounter for screening for human papillomavirus (HPV): Secondary | ICD-10-CM | POA: Diagnosis present

## 2016-02-18 DIAGNOSIS — N898 Other specified noninflammatory disorders of vagina: Secondary | ICD-10-CM

## 2016-02-18 MED ORDER — FLUCONAZOLE 100 MG PO TABS: 150.0000 mg | ORAL_TABLET | Freq: Once | ORAL | Status: DC

## 2016-02-18 NOTE — Assessment & Plan Note (Addendum)
This seems most consistent with vaginal candidiasis, perhaps related to her recent change from methotrexate to azathioprine, so I've treated her with fluconazole 150mg  once today and will call her if her wet prep and GC/Chlam show anything else going on.

## 2016-02-18 NOTE — Patient Instructions (Addendum)
[name] [name],  It was great to see you again today.  I've sent in the fluconazole to treat your fungal infection. I'll call you in the next 2 days if something is abnormal from your pelvic exam.  Come back to see [name] in 3 months to follow up your diabetes or earlier if needed, Dr. Melburn Hake

## 2016-02-18 NOTE — Assessment & Plan Note (Signed)
I performed a Pap smear during the pelvic exam today.

## 2016-02-18 NOTE — Progress Notes (Signed)
Patient ID: [name] [name], female   DOB: 09/09/1966, 50 y.o.   MRN: [id number] [facility] Subjective:   Patient ID: [name] [name] female   DOB: 04-Apr-1966 50 y.o.   MRN: [id number]  HPI: [name] [name] is a 50 y.o. female with neurosarcoidosis, non-insulin dependent type 2 diabetes, hypertension, and hypothyroidism presenting to clinic for vaginal itching and burning.  Vaginal itching and burning: Five days ago, she began having vaginal itching and burning without discharge. One week prior, she was switched from methotrexate to azathioprine by her neurologist for her neurosarcoidosis. She denies any vaginal discharge, dysuria, flank pain, fevers, or new sexual partners.   I have reviewed her medications with her and she does not smoke.  Review of Systems  Constitutional: Negative for fever, chills and malaise/fatigue.  Gastrointestinal: Negative for abdominal pain.  Genitourinary: Negative for dysuria, urgency, frequency and flank pain.  Skin: Positive for itching. Negative for rash.    Objective:  Physical Exam: Filed Vitals:   02/18/16 1351  BP: 154/89  Pulse: 95  Temp: 98.2 F (36.8 C)  TempSrc: Oral  Resp: 18  Height: 5\' 8"  (1.727 m)  Weight: 213 lb 8 oz (96.843 kg)  SpO2: 97%   General: resting in chair comfortably, appropriately conversational Cardiac: regular rate and rhythm, no rubs, murmurs or gallops Pulm: breathing well, clear to auscultation bilaterally Ext: warm and well perfused, without pedal edema GU: scant white vaginal discharge, otherwise normal Skin: multiple discrete ecchymoses on the left anterior pretibial area  Assessment & Plan:  Case discussed with Dr. Lynnae January  Vaginal itching This seems most consistent with vaginal candidiasis, perhaps related to her recent change from methotrexate to azathioprine, so I've treated her with fluconazole 150mg  once today and will call her if her wet prep and GC/Chlam show anything  else going on.  Healthcare maintenance I performed a Pap smear during the pelvic exam today.   Medications Ordered Meds ordered this encounter  Medications  . fluconazole (DIFLUCAN) 100 MG tablet    Sig: Take 1.5 tablets (150 mg total) by mouth once.    Dispense:  2 tablet    Refill:  0   Follow Up: Return in about 3 months (around 05/20/2016).

## 2016-02-19 LAB — CERVICOVAGINAL ANCILLARY ONLY
CHLAMYDIA, DNA PROBE: NEGATIVE
Neisseria Gonorrhea: NEGATIVE
WET PREP (BD AFFIRM): NEGATIVE

## 2016-02-19 LAB — CYTOLOGY - PAP

## 2016-02-19 NOTE — Progress Notes (Signed)
Internal Medicine Clinic Attending  Case discussed with Dr. Flores at the time of the visit.  We reviewed the resident's history and exam and pertinent patient test results.  I agree with the assessment, diagnosis, and plan of care documented in the resident's note. 

## 2016-02-22 ENCOUNTER — Telehealth: Payer: Self-pay | Admitting: Internal Medicine

## 2016-02-22 NOTE — Telephone Encounter (Signed)
Needs to talk to the nurse

## 2016-02-22 NOTE — Telephone Encounter (Signed)
Made appt

## 2016-02-25 ENCOUNTER — Ambulatory Visit (INDEPENDENT_AMBULATORY_CARE_PROVIDER_SITE_OTHER): Payer: Medicaid Other | Admitting: Internal Medicine

## 2016-02-25 ENCOUNTER — Encounter: Payer: Self-pay | Admitting: Internal Medicine

## 2016-02-25 VITALS — BP 157/86 | HR 98 | Temp 98.2°F | Ht 68.0 in | Wt 212.5 lb

## 2016-02-25 DIAGNOSIS — N898 Other specified noninflammatory disorders of vagina: Secondary | ICD-10-CM

## 2016-02-25 DIAGNOSIS — E119 Type 2 diabetes mellitus without complications: Secondary | ICD-10-CM

## 2016-02-25 LAB — GLUCOSE, CAPILLARY: Glucose-Capillary: 126 mg/dL — ABNORMAL HIGH (ref 65–99)

## 2016-02-25 LAB — POCT URINALYSIS DIPSTICK
Bilirubin, UA: NEGATIVE
GLUCOSE UA: NEGATIVE
Ketones, UA: NEGATIVE
Leukocytes, UA: NEGATIVE
NITRITE UA: NEGATIVE
PROTEIN UA: NEGATIVE
RBC UA: NEGATIVE
Spec Grav, UA: 1.01
UROBILINOGEN UA: 0.2
pH, UA: 6.5

## 2016-02-25 MED ORDER — FLUCONAZOLE 150 MG PO TABS: 150.0000 mg | ORAL_TABLET | ORAL | Status: AC

## 2016-02-25 NOTE — Progress Notes (Signed)
Subjective:   Patient ID: [name] [name] female   DOB: [birth year] 50 y.o.   MRN: [id number]  HPI: [name] [name] is a 50 y.o. female w/ PMHx of Sarcoidosis, HTN, DM type II, OSA, osteoporosis, GERD, and anemia, presents to the clinic today for a follow-up visit for vaginal discomfort. During her previous visit, patient was given 1 dose of Diflucan for candidiasis and wet prep, GC/chlamydia were found to be negative. Today, she says she continues to have burning, as well as some thick, whitish discharge. Mild dysuria as well. UA negative. She recently had her immunosuppressive therapy changed to Imuran at which time she started having this vaginal discomfort. No systemic symptoms otherwise; no fever, chills, nausea, diarrhea, abdominal pain, or cough.   Past Medical History  Diagnosis Date  . Sarcoidosis ([place])   . OSA (obstructive sleep apnea)   . Prolonged QT interval   . Osteoporosis   . Anemia   . GERD (gastroesophageal reflux disease)   . DM (diabetes mellitus) ([place])   . Peripheral neuropathy ([place])   . HTN (hypertension)   . Nephrolithiasis   . Chronic fatigue   . Obesity   . Headache(784.0)   . Gait disturbance     Current Outpatient Prescriptions  Medication Sig Dispense Refill  . aspirin 81 MG tablet Take 81 mg by mouth at bedtime.     Marland Kitchen atorvastatin (LIPITOR) 20 MG tablet TAKE 1 TABLET (20 MG TOTAL) BY MOUTH EVERY EVENING. 31 tablet 6  . azaTHIOprine (IMURAN) 50 MG tablet Take 1 tablet (50 mg total) by mouth 2 (two) times daily. 60 tablet 3  . [facility] SENNA PLUS 8.6-50 MG tablet TAKE 2 TABLETS TWICE A DAY 30 tablet 1  . diclofenac sodium (VOLTAREN) 1 % GEL Apply 2 g topically 4 (four) times daily. 1 Tube 0  . fluconazole (DIFLUCAN) 100 MG tablet Take 1.5 tablets (150 mg total) by mouth once. 2 tablet 0  . folic acid (FOLVITE) 1 MG tablet Take 1 tablet (1 mg total) by mouth daily. 100 tablet 3  . gabapentin (NEURONTIN) 300 MG capsule Take 1 capsule (300 mg total) by  mouth 2 (two) times daily. 60 capsule 3  . HYDROcodone-acetaminophen (NORCO/VICODIN) 5-325 MG tablet Take 1 tablet by mouth every 6 (six) hours as needed for moderate pain. For headache 30 tablet 0  . levothyroxine (SYNTHROID, LEVOTHROID) 137 MCG tablet TAKE 1 TABLET BY MOUTH EVERY DAY BEFORE BREAKFAST 60 tablet 2  . losartan (COZAAR) 50 MG tablet Take 50 mg by mouth daily.  11  . metFORMIN (GLUCOPHAGE) 1000 MG tablet Take 1 tablet (1,000 mg total) by mouth 2 (two) times daily with a meal. 120 tablet 2  . Multiple Vitamin (MULTIVITAMIN WITH MINERALS) TABS tablet Take 1 tablet by mouth every evening.     . pantoprazole (PROTONIX) 20 MG tablet Take 1 tablet (20 mg total) by mouth daily. 30 tablet 1   No current facility-administered medications for this visit.    Review of Systems: General: Denies fever, chills, diaphoresis, appetite change and fatigue.  Respiratory: Denies SOB, DOE, cough, and wheezing.   Cardiovascular: Denies chest pain and palpitations.  Gastrointestinal: Denies nausea, vomiting, abdominal pain, and diarrhea.  Genitourinary: Positive for vaginal discomfort, dysuria, and discharge. Denies increased frequency, and flank pain. Endocrine: Denies hot or cold intolerance, polyuria, and polydipsia. Musculoskeletal: Denies myalgias, back pain, joint swelling, arthralgias and gait problem.  Skin: Denies pallor, rash and wounds.  Neurological: Denies dizziness, seizures, syncope, weakness,  lightheadedness, numbness and headaches.  Psychiatric/Behavioral: Denies mood changes, and sleep disturbances.  Objective:   Physical Exam: Filed Vitals:   02/25/16 1322  BP: 157/86  Pulse: 98  Temp: 98.2 F (36.8 C)  TempSrc: Oral  Height: 5\' 8"  (1.727 m)  Weight: 212 lb 8 oz (96.389 kg)  SpO2: 100%    General: Alert, cooperative, NAD. HEENT: PERRL, EOMI. Moist mucus membranes Neck: Full range of motion without pain, supple, no lymphadenopathy or carotid bruits Lungs: Clear to  ascultation bilaterally, normal work of respiration, no wheezes, rales, rhonchi Heart: RRR, no murmurs, gallops, or rubs Abdomen: Soft, non-tender, non-distended, BS + Extremities: No cyanosis, clubbing, or edema Neurologic: Alert & oriented X3, cranial nerves II-XII intact, strength grossly intact, sensation intact to light touch   Assessment & Plan:   Please see problem based assessment and plan.

## 2016-02-25 NOTE — Patient Instructions (Signed)
1. Please make a follow up appointment for 2 weeks.   2. Please take all medications as previously prescribed with the following changes:  Take Diflucan 150 mg once today, then again in 72 hours (3 days).   3. If you have worsening of your symptoms or new symptoms arise, please call the clinic [phone]), or go to the ER immediately if symptoms are severe.  Monilial Vaginitis Vaginitis in a soreness, swelling and redness (inflammation) of the vagina and vulva. Monilial vaginitis is not a sexually transmitted infection. CAUSES  Yeast vaginitis is caused by yeast (candida) that is normally found in your vagina. With a yeast infection, the candida has overgrown in number to a point that upsets the chemical balance. SYMPTOMS   White, thick vaginal discharge.  Swelling, itching, redness and irritation of the vagina and possibly the lips of the vagina (vulva).  Burning or painful urination.  Painful intercourse. DIAGNOSIS  Things that may contribute to monilial vaginitis are:  Postmenopausal and virginal states.  Pregnancy.  Infections.  Being tired, sick or stressed, especially if you had monilial vaginitis in the past.  Diabetes. Good control will help lower the chance.  Birth control pills.  Tight fitting garments.  Using bubble bath, feminine sprays, douches or deodorant tampons.  Taking certain medications that kill germs (antibiotics).  Sporadic recurrence can occur if you become ill. TREATMENT  Your caregiver will give you medication.  There are several kinds of anti monilial vaginal creams and suppositories specific for monilial vaginitis. For recurrent yeast infections, use a suppository or cream in the vagina 2 times a week, or as directed.  Anti-monilial or steroid cream for the itching or irritation of the vulva may also be used. Get your caregiver's permission.  Painting the vagina with methylene blue solution may help if the monilial cream does not  work.  Eating yogurt may help prevent monilial vaginitis. HOME CARE INSTRUCTIONS   Finish all medication as prescribed.  Do not have sex until treatment is completed or after your caregiver tells you it is okay.  Take warm sitz baths.  Do not douche.  Do not use tampons, especially scented ones.  Wear cotton underwear.  Avoid tight pants and panty hose.  Tell your sexual partner that you have a yeast infection. They should go to their caregiver if they have symptoms such as mild rash or itching.  Your sexual partner should be treated as well if your infection is difficult to eliminate.  Practice safer sex. Use condoms.  Some vaginal medications cause latex condoms to fail. Vaginal medications that harm condoms are:  Cleocin cream.  Butoconazole (Femstat).  Terconazole (Terazol) vaginal suppository.  Miconazole (Monistat) (may be purchased over the counter). SEEK MEDICAL CARE IF:   You have a temperature by mouth above 102 F (38.9 C).  The infection is getting worse after 2 days of treatment.  The infection is not getting better after 3 days of treatment.  You develop blisters in or around your vagina.  You develop vaginal bleeding, and it is not your menstrual period.  You have pain when you urinate.  You develop intestinal problems.  You have pain with sexual intercourse.   This information is not intended to replace advice given to you by your health care provider. Make sure you discuss any questions you have with your health care provider.   Document Released: 08/27/2005 Document Revised: 02/09/2012 Document Reviewed: 05/21/2015 Elsevier Interactive Patient Education [employer].

## 2016-02-26 NOTE — Assessment & Plan Note (Signed)
Still with itching, burning, and thick, whitish discharge. Was given 1 dose of Diflucan at her previous visit and she says this helped for a few days but her symptoms came back after a little while. GC/chlamydia and wet prep were negative. No systemic symptoms. Feel she has vaginal candidiasis most likely related to recent change of medication to azathioprine from MTX. UA negative, do not suspect UTI.  -Give Rx for Diflucan 150 mg once today and another dose in 3 days -If no resolution of symptoms, can consider topical therapies in addition to prolonged course of Diflucan. -RTC in 2 weeks

## 2016-02-26 NOTE — Assessment & Plan Note (Signed)
CBG 126 today. Advised careful control of blood sugar given recurrent vaginal candidiasis.

## 2016-02-27 ENCOUNTER — Other Ambulatory Visit: Payer: Self-pay | Admitting: Internal Medicine

## 2016-02-27 NOTE — Progress Notes (Signed)
Internal Medicine Clinic Attending  Case discussed with Dr. Jones at the time of the visit.  We reviewed the resident's history and exam and pertinent patient test results.  I agree with the assessment, diagnosis, and plan of care documented in the resident's note.  

## 2016-03-10 ENCOUNTER — Ambulatory Visit: Payer: Medicaid Other | Admitting: Internal Medicine

## 2016-04-07 ENCOUNTER — Other Ambulatory Visit: Payer: Self-pay | Admitting: Internal Medicine

## 2016-04-08 ENCOUNTER — Other Ambulatory Visit: Payer: Self-pay | Admitting: Internal Medicine

## 2016-04-22 ENCOUNTER — Telehealth: Payer: Self-pay | Admitting: Neurology

## 2016-04-22 DIAGNOSIS — M25552 Pain in left hip: Principal | ICD-10-CM

## 2016-04-22 DIAGNOSIS — G8929 Other chronic pain: Secondary | ICD-10-CM

## 2016-04-22 MED ORDER — DEXAMETHASONE 2 MG PO TABS: ORAL_TABLET | ORAL | Status: DC

## 2016-04-22 MED ORDER — HYDROCODONE-ACETAMINOPHEN 5-325 MG PO TABS: 1.0000 | ORAL_TABLET | Freq: Four times a day (QID) | ORAL | Status: DC | PRN

## 2016-04-22 NOTE — Telephone Encounter (Signed)
Patient called to request something for headache. "Headache for almost a week now, not sleeping because of it, constantly up back and forth because of headache. Hurting down into neck now too."

## 2016-04-22 NOTE — Telephone Encounter (Signed)
I called patient. The patient has had a stiff in headache over the last 1 week. I will call in a prescription for Decadron taper over the next 3 days, the patient will be given a prescription for hydrocodone.

## 2016-04-22 NOTE — Telephone Encounter (Signed)
Patient came in a got Rx.

## 2016-04-22 NOTE — Telephone Encounter (Signed)
See note below. She confirmed that her headache "came back" and is taking her Imuran twice a day. How do you advise?

## 2016-05-01 ENCOUNTER — Other Ambulatory Visit: Payer: Self-pay | Admitting: Internal Medicine

## 2016-05-01 NOTE — Telephone Encounter (Signed)
Senna just done

## 2016-05-09 ENCOUNTER — Ambulatory Visit (INDEPENDENT_AMBULATORY_CARE_PROVIDER_SITE_OTHER): Payer: Medicaid Other | Admitting: Internal Medicine

## 2016-05-09 ENCOUNTER — Encounter: Payer: Self-pay | Admitting: Internal Medicine

## 2016-05-09 VITALS — BP 132/70 | HR 98 | Temp 98.1°F | Ht 68.0 in | Wt 210.8 lb

## 2016-05-09 DIAGNOSIS — G8929 Other chronic pain: Secondary | ICD-10-CM | POA: Diagnosis not present

## 2016-05-09 DIAGNOSIS — D8689 Sarcoidosis of other sites: Secondary | ICD-10-CM

## 2016-05-09 DIAGNOSIS — M25552 Pain in left hip: Secondary | ICD-10-CM

## 2016-05-09 DIAGNOSIS — L52 Erythema nodosum: Secondary | ICD-10-CM

## 2016-05-09 DIAGNOSIS — N898 Other specified noninflammatory disorders of vagina: Secondary | ICD-10-CM | POA: Diagnosis not present

## 2016-05-09 MED ORDER — FLUCONAZOLE 150 MG PO TABS: 150.0000 mg | ORAL_TABLET | Freq: Once | ORAL | Status: DC

## 2016-05-09 MED ORDER — KETOROLAC TROMETHAMINE 60 MG/2ML IM SOLN
60.0000 mg | Freq: Once | INTRAMUSCULAR | Status: AC
  Administered 2016-05-09: 60 mg via INTRAMUSCULAR

## 2016-05-09 NOTE — Patient Instructions (Signed)
I am sorry your symptoms are doing worse today. For your hip pain we gave a shot of toradol and would like to see if this improves your symptoms significantly.  I also prescribed an antifungal medication for your probable yeast infection. This medicine should be taken 150mg  (3 pills) at a time each day separated 3 days apart such as Saturday-Tuesday-Friday or an equivalent schedule.  I will call your neurologist's office next week and discuss changing you back to methotrexate since azathioprine seems to be having worse side effects and less effect on treating your sarcoidosis. I will plan to call you with updates regarding this plan, but also would like to see you back in the clinic in about 1-2 weeks for reassessment.

## 2016-05-12 NOTE — Assessment & Plan Note (Signed)
Assessment: This very chronic pain has been extensively worked up already to date with normal NCS, EMG, hip joint. She does have some degeneration of the left SI joint on CT scan from last year but this does not really fit with a radicular distribution of her pain. She was diagnosed with trochanteric bursitis in 2016 after a fall but this similarly does not fit her symptom distribution.  She was recommended for lumbar spine MRI per neurology which has not yet been performed. She has gotten some partial relief with topical and systemic NSAIDs. There is no weakness or loss of sensation in her left leg. She is also requesting possibly a toradol injection today for acutely worsened pain. This seems like a suboptimal plan of repeat injections but is reasonable to offer at her request.  Plan: -toradol 60mg  IM today -If still doing poorly can try changing therapy from meloxicam to salsalate -RTC in 2 weeks

## 2016-05-12 NOTE — Assessment & Plan Note (Addendum)
Assessment: Her symptoms resolved after treatment with fluconazole in the past but now have recurrent and are ongoing since about 2-3 weeks ago. Whitish discharge with constant pruritis, minimal dysuria, and no abdominal pain. No hematuria or foul smelling or discolored urine. This seems to be a quickly recurring problem since March.  Plan: Given the similarity of her symptoms to recent yeast infection before this will plan to treat empirically. She is high risk/complicated due to her immunosuppression so we will treat with 150mg  x3 doses 3 days apart. She expressed comfort with this treatment plan after having recently completed the same 2 months ago.

## 2016-05-12 NOTE — Progress Notes (Signed)
Subjective:   Patient ID: [name] [name] female   DOB: May 10, 1966 50 y.o.   MRN: [id number]  HPI: [name] [name] is a 50 y.o. woman with PMHx detailed below presenting for follow up of her neurosarcoidosis, chronic left hip pain, and recurrent yeast infections. She was changed from treatment on methotrexate to azathioprine due to a drug recall in March and since that time reports more episodes of severe headache including one for which she received a short course of dexamethasone on 5/23 from [place]. She also complains of more frequent nausea and feels fatigued. She has also had recurrent vaginal itching treated twice for yeast infection since that time.  In addition to this her left hip pain feels worse than usual. This pain is persistent worsened with prolonged standing or lying flat on her back with some radiation down the left leg. There is no associated weakness or numbness.  See problem based assessment and plan below for additional details.  Past Medical History  Diagnosis Date  . Sarcoidosis ([place])   . OSA (obstructive sleep apnea)   . Prolonged QT interval   . Osteoporosis   . Anemia   . GERD (gastroesophageal reflux disease)   . DM (diabetes mellitus) ([place])   . Peripheral neuropathy ([place])   . HTN (hypertension)   . Nephrolithiasis   . Chronic fatigue   . Obesity   . Headache(784.0)   . Gait disturbance    Current Outpatient Prescriptions  Medication Sig Dispense Refill  . aspirin 81 MG tablet Take 81 mg by mouth at bedtime.     Marland Kitchen atorvastatin (LIPITOR) 20 MG tablet TAKE 1 TABLET BY MOUTH EVERY EVENING 31 tablet 6  . azaTHIOprine (IMURAN) 50 MG tablet Take 1 tablet (50 mg total) by mouth 2 (two) times daily. 60 tablet 3  . [facility] SENNA PLUS 8.6-50 MG tablet TAKE 2 TABLETS TWICE A DAY 30 tablet 1  . dexamethasone (DECADRON) 2 MG tablet Begin with 3 tablets the first day, taper by one each day until off 6 tablet 0  . diclofenac sodium (VOLTAREN) 1 % GEL Apply 2 g  topically 4 (four) times daily. 1 Tube 0  . fluconazole (DIFLUCAN) 150 MG tablet Take 1 tablet (150 mg total) by mouth once. Take 3 tablets each 3 days apart: Saturday-Tuesday-Friday, Or equivalent 9 tablet 0  . folic acid (FOLVITE) 1 MG tablet Take 1 tablet (1 mg total) by mouth daily. 100 tablet 3  . gabapentin (NEURONTIN) 300 MG capsule Take 1 capsule (300 mg total) by mouth 2 (two) times daily. 60 capsule 3  . HYDROcodone-acetaminophen (NORCO/VICODIN) 5-325 MG tablet Take 1 tablet by mouth every 6 (six) hours as needed for moderate pain. For headache 30 tablet 0  . levothyroxine (SYNTHROID, LEVOTHROID) 137 MCG tablet TAKE 1 TABLET BY MOUTH EVERY DAY BEFORE BREAKFAST 60 tablet 2  . losartan (COZAAR) 50 MG tablet Take 50 mg by mouth daily.  11  . metFORMIN (GLUCOPHAGE) 1000 MG tablet TAKE 1 TABLET BY MOUTH TWICE A DAY WITH A MEAL 120 tablet 2  . Multiple Vitamin (MULTIVITAMIN WITH MINERALS) TABS tablet Take 1 tablet by mouth every evening.     . pantoprazole (PROTONIX) 20 MG tablet TAKE 1 TABLET BY MOUTH EVERY DAY 30 tablet 1   No current facility-administered medications for this visit.   Family History  Problem Relation Age of Onset  . Heart disease Mother     questionable CAD and arrythmias   . Cancer Mother  Breast cancer  . Venous thrombosis Sister   . Diabetes Sister   . Heart disease Sister   . Diabetes Brother   . Heart disease Brother   . Cancer Maternal Aunt   . Venous thrombosis Sister   . Diabetes Sister   . Heart disease Sister    Social History   Social History  . Marital Status: Married    Spouse Name: N/A  . Number of Children: 4  . Years of Education: 12   Occupational History  .     Social History Main Topics  . Smoking status: Never Smoker   . Smokeless tobacco: Never Used  . Alcohol Use: No  . Drug Use: No  . Sexual Activity: Not Asked     Comment: hysterectomy   Other Topics Concern  . None   Social History Narrative   Lives in [place]  with 2 sons and her husband;disabled; no use of tobacco products nor alcohol.   Patient given diabetes card on 12/05/2010.      Patient drinks 2 glasses of tea daily.   Patient is right handed.    Review of Systems: Review of Systems  Constitutional: Positive for malaise/fatigue.  Eyes: Negative for blurred vision.  Respiratory: Negative for shortness of breath.   Cardiovascular: Negative for leg swelling.  Gastrointestinal: Positive for nausea. Negative for vomiting, diarrhea and blood in stool.  Genitourinary: Negative for dysuria.       Vaginal itching with scant white discharge  Musculoskeletal: Positive for back pain and joint pain. Negative for falls.  Skin: Positive for rash.  Neurological: Positive for headaches. Negative for dizziness.  Endo/Heme/Allergies: Does not bruise/bleed easily.  Psychiatric/Behavioral: The patient has insomnia.     Objective:  Physical Exam: Filed Vitals:   05/09/16 1521  BP: 132/70  Pulse: 98  Temp: 98.1 F (36.7 C)  TempSrc: Oral  Height: 5\' 8"  (1.727 m)  Weight: 210 lb 12.8 oz (95.618 kg)  SpO2: 100%   GENERAL- alert, co-operative, NAD HEENT- Wearing wig, no cervical LN enlargement CARDIAC- RRR, no murmurs, rubs or gallops. RESP- CTAB, no wheezes or crackles. ABDOMEN- Soft, nontender, no guarding or rebound, normoactive bowel sounds present BACK- Moderate paraspinal tenderness on left NEURO-  Strength upper and lower extremities- 5/5, Sensation intact globally, straight leg raise and hip inversion/eversion reproduce left hip pain EXTREMITIES- pulse 2+, symmetric, no pedal edema. SKIN- Small slightly pigmented lesion on left anterior shin PSYCH- Normal mood and affect, appropriate thought content and speech.  Assessment & Plan:

## 2016-05-12 NOTE — Assessment & Plan Note (Addendum)
Assessment: Since changing from MTX to AZT almost 3 months ago her disease seems less controlled and with increase in adverse side effects including nausea, fatigue, and yeast infections. This includes a flare of headaches leading to a brief course of decadron on 5/23 which did improve her symptoms.  Plan: I will contact [facility] with recommendation to consider changing back to her previous regimen if they also feel it appropriate and the medication is available. For now continue azathioprine 50mg  BID

## 2016-05-12 NOTE — Progress Notes (Signed)
Internal Medicine Clinic Attending  Case discussed with Dr. Rice at the time of the visit.  We reviewed the resident's history and exam and pertinent patient test results.  I agree with the assessment, diagnosis, and plan of care documented in the resident's note.  

## 2016-05-23 ENCOUNTER — Other Ambulatory Visit: Payer: Self-pay | Admitting: Internal Medicine

## 2016-05-23 DIAGNOSIS — Z0000 Encounter for general adult medical examination without abnormal findings: Secondary | ICD-10-CM

## 2016-05-23 NOTE — Telephone Encounter (Signed)
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 This will need 12 month follow up with repeat cytology + HPV for appropriate monitoring (March 2018). She remains at elevated risk of malignancy compared to average due to her immunosuppressive regimen.

## 2016-05-23 NOTE — Telephone Encounter (Signed)
-----   Message from Orson Gear sent at 05/19/2016  9:50 AM EDT ----- Dr. Benjamine Mola,  This patient had a PAP smear on 02/18/16 and was seen that day by Dr. Melburn Hake.  Results, HPV Detected.  She was seen by you on June 9 and was given medication for a probable yeast infection.  You ask that she return in about two weeks, no appointment has been made at this time.  The lab follows abnormal PAPS and need to know what the plan going forward will be for the HPV.  If you have questions, please call me at [phone].  Thanks, [employer]

## 2016-05-23 NOTE — Assessment & Plan Note (Addendum)
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 This will need 12 month follow up with repeat pap that includes cytology+HPV for appropriate monitoring.

## 2016-05-27 ENCOUNTER — Telehealth: Payer: Self-pay | Admitting: Neurology

## 2016-05-27 MED ORDER — METHOTREXATE 2.5 MG PO TABS: 10.0000 mg | ORAL_TABLET | ORAL | Status: DC

## 2016-05-27 NOTE — Telephone Encounter (Signed)
I called patient. She is having some stomach upset, reflux symptoms on the Imuran. She also reports some muscle soreness type pain in the neck and chest area with lying down over the last month or 2. We will try to get her back on the methotrexate, this apparently was recalled with a 2.5 mg tablets in March, hopefully this issue has been resolved.

## 2016-06-07 ENCOUNTER — Other Ambulatory Visit: Payer: Self-pay | Admitting: Internal Medicine

## 2016-06-08 ENCOUNTER — Emergency Department (HOSPITAL_COMMUNITY)
Admission: EM | Admit: 2016-06-08 | Discharge: 2016-06-08 | Disposition: A | Discharge status: Home / Self Care | Payer: Medicaid Other | Attending: Emergency Medicine | Admitting: Emergency Medicine

## 2016-06-08 ENCOUNTER — Emergency Department (HOSPITAL_COMMUNITY): Payer: Medicaid Other

## 2016-06-08 DIAGNOSIS — Z7984 Long term (current) use of oral hypoglycemic drugs: Secondary | ICD-10-CM | POA: Diagnosis not present

## 2016-06-08 DIAGNOSIS — Z7982 Long term (current) use of aspirin: Secondary | ICD-10-CM | POA: Diagnosis not present

## 2016-06-08 DIAGNOSIS — I10 Essential (primary) hypertension: Secondary | ICD-10-CM | POA: Diagnosis not present

## 2016-06-08 DIAGNOSIS — R0602 Shortness of breath: Secondary | ICD-10-CM | POA: Diagnosis not present

## 2016-06-08 DIAGNOSIS — J029 Acute pharyngitis, unspecified: Secondary | ICD-10-CM | POA: Diagnosis present

## 2016-06-08 DIAGNOSIS — E119 Type 2 diabetes mellitus without complications: Secondary | ICD-10-CM | POA: Diagnosis not present

## 2016-06-08 NOTE — ED Provider Notes (Signed)
CSN: [id number]     Arrival date & time 06/08/16  2150 History   First MD Initiated Contact with Patient 06/08/16 2204     Chief Complaint  Patient presents with  . Sore Throat     (Consider location/radiation/quality/duration/timing/severity/associated sxs/prior Treatment) HPI Comments: Patient here complaining of 2-3 month history of foreign body sensation in the exterior portion of her throat as well as her upper chest. Has a history of sarcoid and is currently receiving chemotherapy with methotrexate. States her symptoms began after she started her chemotherapy. Denies any cough or hemoptysis. No fever or chills. No persistent dyspnea. Has not been evaluated for this in the past. No treatment used for this prior to arrival and nothing makes her symptoms better.  Patient is a 50 y.o. female presenting with pharyngitis. The history is provided by the patient.  Sore Throat    Past Medical History  Diagnosis Date  . Sarcoidosis ([place])   . OSA (obstructive sleep apnea)   . Prolonged QT interval   . Osteoporosis   . Anemia   . GERD (gastroesophageal reflux disease)   . DM (diabetes mellitus) ([place])   . Peripheral neuropathy ([place])   . HTN (hypertension)   . Nephrolithiasis   . Chronic fatigue   . Obesity   . Headache(784.0)   . Gait disturbance    Past Surgical History  Procedure Laterality Date  . Hysteroscopy    . Dilation and curettage of uterus    . Partial hysterectomy  2013  . Suboccipital craniotomy    . Thyroidectomy      Precancerous lesion  . Rotator cuff repair Right     07/2013   Family History  Problem Relation Age of Onset  . Heart disease Mother     questionable CAD and arrythmias   . Cancer Mother     Breast cancer  . Venous thrombosis Sister   . Diabetes Sister   . Heart disease Sister   . Diabetes Brother   . Heart disease Brother   . Cancer Maternal Aunt   . Venous thrombosis Sister   . Diabetes Sister   . Heart disease Sister    Social History   Substance Use Topics  . Smoking status: Never Smoker   . Smokeless tobacco: Never Used  . Alcohol Use: No   OB History    Gravida Para Term Preterm AB TAB SAB Ectopic Multiple Living   5 4        4      Review of Systems  All other systems reviewed and are negative.     Allergies  Review of patient's allergies indicates no known allergies.  Home Medications   Prior to Admission medications   Medication Sig Start Date End Date Taking? Authorizing Provider  aspirin 81 MG tablet Take 81 mg by mouth at bedtime.     Historical Provider, MD  atorvastatin (LIPITOR) 20 MG tablet TAKE 1 TABLET BY MOUTH EVERY EVENING 05/01/16   Collier Salina, MD  [employer] SENNA PLUS 8.6-50 MG tablet TAKE 2 TABLETS TWICE A DAY 05/23/16   Collier Salina, MD  dexamethasone (DECADRON) 2 MG tablet Begin with 3 tablets the first day, taper by one each day until off 04/22/16   Kathrynn Ducking, MD  diclofenac sodium (VOLTAREN) 1 % GEL Apply 2 g topically 4 (four) times daily. 02/01/16   Loleta Chance, MD  fluconazole (DIFLUCAN) 150 MG tablet Take 1 tablet (150 mg total) by mouth once. Take 3  tablets each 3 days apart: Saturday-Tuesday-Friday, Or equivalent 05/09/16   Collier Salina, MD  folic acid (FOLVITE) 1 MG tablet Take 1 tablet (1 mg total) by mouth daily. 09/07/15 09/06/16  Collier Salina, MD  gabapentin (NEURONTIN) 300 MG capsule Take 1 capsule (300 mg total) by mouth 2 (two) times daily. 12/31/15   Kathrynn Ducking, MD  HYDROcodone-acetaminophen (NORCO/VICODIN) 5-325 MG tablet Take 1 tablet by mouth every 6 (six) hours as needed for moderate pain. For headache 04/22/16   Kathrynn Ducking, MD  levothyroxine (SYNTHROID, LEVOTHROID) 137 MCG tablet TAKE 1 TABLET BY MOUTH EVERY DAY BEFORE BREAKFAST 12/20/15   Collier Salina, MD  losartan (COZAAR) 50 MG tablet Take 50 mg by mouth daily. 11/13/15   Historical Provider, MD  metFORMIN (GLUCOPHAGE) 1000 MG tablet TAKE 1 TABLET BY MOUTH TWICE A DAY WITH A MEAL 04/07/16    Collier Salina, MD  methotrexate (RHEUMATREX) 2.5 MG tablet Take 4 tablets (10 mg total) by mouth once a week. Caution:Chemotherapy. Protect from light. 05/27/16   Kathrynn Ducking, MD  Multiple Vitamin (MULTIVITAMIN WITH MINERALS) TABS tablet Take 1 tablet by mouth every evening.     Historical Provider, MD  pantoprazole (PROTONIX) 20 MG tablet TAKE 1 TABLET BY MOUTH EVERY DAY 04/07/16   Collier Salina, MD   BP 153/99 mmHg  Pulse 85  Temp(Src) 97.8 F (36.6 C) (Oral)  Resp 18  SpO2 100%  LMP 12/03/2011 Physical Exam  Constitutional: She is oriented to person, place, and time. She appears well-developed and well-nourished.  Non-toxic appearance. No distress.  HENT:  Head: Normocephalic and atraumatic.  Eyes: Conjunctivae, EOM and lids are normal. Pupils are equal, round, and reactive to light.  Neck: Normal range of motion. Neck supple. No tracheal deviation present. No thyroid mass present.  Cardiovascular: Normal rate, regular rhythm and normal heart sounds.  Exam reveals no gallop.   No murmur heard. Pulmonary/Chest: Effort normal and breath sounds normal. No stridor. No respiratory distress. She has no decreased breath sounds. She has no wheezes. She has no rhonchi. She has no rales.  Abdominal: Soft. Normal appearance and bowel sounds are normal. She exhibits no distension. There is no tenderness. There is no rebound and no CVA tenderness.  Musculoskeletal: Normal range of motion. She exhibits no edema or tenderness.  Neurological: She is alert and oriented to person, place, and time. She has normal strength. No cranial nerve deficit or sensory deficit. GCS eye subscore is 4. GCS verbal subscore is 5. GCS motor subscore is 6.  Skin: Skin is warm and dry. No abrasion and no rash noted.  Psychiatric: She has a normal mood and affect. Her speech is normal and behavior is normal.  Nursing note and vitals reviewed.   ED Course  Procedures (including critical care time) Labs  Review Labs Reviewed - No data to display  Imaging Review No results found. I have personally reviewed and evaluated these images and lab results as part of my medical decision-making.   EKG Interpretation None      MDM   Final diagnoses:  SOB (shortness of breath)    Patient's x-rays here are reassuring. She was instructed to follow-up with Dr. Wyatt Portela, MD 06/08/16 [phone]

## 2016-06-08 NOTE — ED Notes (Signed)
Pt states that she has sarcoidosis and has had a sore throat x 3 weeks that hurts more when she eats and breathes in. Alert and oriented.

## 2016-06-08 NOTE — Discharge Instructions (Signed)
Follow-up with Dr. Jannifer Franklin

## 2016-06-09 ENCOUNTER — Other Ambulatory Visit: Payer: Self-pay | Admitting: Internal Medicine

## 2016-06-11 MED ORDER — SENNOSIDES-DOCUSATE SODIUM 8.6-50 MG PO TABS: 2.0000 | ORAL_TABLET | Freq: Two times a day (BID) | ORAL | Status: DC

## 2016-06-19 ENCOUNTER — Ambulatory Visit (INDEPENDENT_AMBULATORY_CARE_PROVIDER_SITE_OTHER): Payer: Medicaid Other | Admitting: Neurology

## 2016-06-19 ENCOUNTER — Encounter: Payer: Self-pay | Admitting: Neurology

## 2016-06-19 VITALS — BP 152/88 | HR 84 | Ht 68.0 in | Wt 214.0 lb

## 2016-06-19 DIAGNOSIS — M549 Dorsalgia, unspecified: Secondary | ICD-10-CM

## 2016-06-19 DIAGNOSIS — G8929 Other chronic pain: Secondary | ICD-10-CM | POA: Diagnosis not present

## 2016-06-19 DIAGNOSIS — D8689 Sarcoidosis of other sites: Secondary | ICD-10-CM | POA: Diagnosis not present

## 2016-06-19 DIAGNOSIS — M25552 Pain in left hip: Secondary | ICD-10-CM

## 2016-06-19 DIAGNOSIS — Z5181 Encounter for therapeutic drug level monitoring: Secondary | ICD-10-CM | POA: Diagnosis not present

## 2016-06-19 DIAGNOSIS — G43019 Migraine without aura, intractable, without status migrainosus: Secondary | ICD-10-CM

## 2016-06-19 MED ORDER — GABAPENTIN 300 MG PO CAPS: ORAL_CAPSULE | ORAL | Status: DC

## 2016-06-19 MED ORDER — HYDROCODONE-ACETAMINOPHEN 5-325 MG PO TABS: 1.0000 | ORAL_TABLET | Freq: Four times a day (QID) | ORAL | Status: DC | PRN

## 2016-06-19 NOTE — Progress Notes (Signed)
Reason for visit: Neurosarcoidosis  [name] [name] is an 50 y.o. female  History of present illness:  [name] [name] is a 50 year old right-handed black female with a history of neurosarcoidosis. The patient has ongoing daily chronic headaches, she has difficulty sleeping because of this. In the last 3-1/2 weeks she has developed new symptoms of burning sensations in the throat, and in the right chest area. The patient had been on Imuran as the methotrexate was backordered. The patient had some stomach upset and reflux symptoms with this. She is on low-dose Protonix taking only 20 mg daily. The patient went to the emergency room because of the throat discomfort, and an x-ray of the chest was done, x-ray of the soft tissues of the neck was done and the studies were unremarkable. The patient continues to have ongoing left-sided hip and left leg pain going down to the ankle level with some sensation of numbness and tingling. The patient has had EMG and nerve conduction study done previously that was unremarkable. The patient also reports some episodes of vertigo when she looks up. The patient returns to the office today for an evaluation.  Past Medical History  Diagnosis Date  . Sarcoidosis ([place])   . OSA (obstructive sleep apnea)   . Prolonged QT interval   . Osteoporosis   . Anemia   . GERD (gastroesophageal reflux disease)   . DM (diabetes mellitus) ([place])   . Peripheral neuropathy ([place])   . HTN (hypertension)   . Nephrolithiasis   . Chronic fatigue   . Obesity   . Headache(784.0)   . Gait disturbance     Past Surgical History  Procedure Laterality Date  . Hysteroscopy    . Dilation and curettage of uterus    . Partial hysterectomy  2013  . Suboccipital craniotomy    . Thyroidectomy      Precancerous lesion  . Rotator cuff repair Right     07/2013    Family History  Problem Relation Age of Onset  . Heart disease Mother     questionable CAD and arrythmias   . Cancer Mother       Breast cancer  . Venous thrombosis Sister   . Diabetes Sister   . Heart disease Sister   . Diabetes Brother   . Heart disease Brother   . Cancer Maternal Aunt   . Venous thrombosis Sister   . Diabetes Sister   . Heart disease Sister     Social history:  reports that she has never smoked. She has never used smokeless tobacco. She reports that she does not drink alcohol or use illicit drugs.   No Known Allergies  Medications:  Prior to Admission medications   Medication Sig Start Date End Date Taking? Authorizing Provider  aspirin 81 MG tablet Take 81 mg by mouth at bedtime.    Yes Historical Provider, MD  atorvastatin (LIPITOR) 20 MG tablet TAKE 1 TABLET BY MOUTH EVERY EVENING 05/01/16  Yes Collier Salina, MD  dexamethasone (DECADRON) 2 MG tablet Begin with 3 tablets the first day, taper by one each day until off 04/22/16  Yes Kathrynn Ducking, MD  folic acid (FOLVITE) 1 MG tablet Take 1 tablet (1 mg total) by mouth daily. 09/07/15 09/06/16 Yes Collier Salina, MD  gabapentin (NEURONTIN) 300 MG capsule Take 1 capsule (300 mg total) by mouth 2 (two) times daily. 12/31/15  Yes Kathrynn Ducking, MD  HYDROcodone-acetaminophen (NORCO/VICODIN) 5-325 MG tablet Take 1 tablet by  mouth every 6 (six) hours as needed for moderate pain. For headache 04/22/16  Yes Kathrynn Ducking, MD  levothyroxine (SYNTHROID, LEVOTHROID) 137 MCG tablet TAKE 1 TABLET BY MOUTH EVERY DAY BEFORE BREAKFAST 12/20/15  Yes Collier Salina, MD  losartan (COZAAR) 50 MG tablet Take 50 mg by mouth daily. 11/13/15  Yes Historical Provider, MD  metFORMIN (GLUCOPHAGE) 1000 MG tablet TAKE 1 TABLET BY MOUTH TWICE A DAY WITH A MEAL 04/07/16  Yes Collier Salina, MD  methotrexate (RHEUMATREX) 2.5 MG tablet Take 4 tablets (10 mg total) by mouth once a week. Caution:Chemotherapy. Protect from light. 05/27/16  Yes Kathrynn Ducking, MD  Multiple Vitamin (MULTIVITAMIN WITH MINERALS) TABS tablet Take 1 tablet by mouth every evening.     Yes Historical Provider, MD  pantoprazole (PROTONIX) 20 MG tablet TAKE 1 TABLET BY MOUTH DAILY 06/09/16  Yes Collier Salina, MD  senna-docusate ([employer] SENNA PLUS) 8.6-50 MG tablet Take 2 tablets by mouth 2 (two) times daily. As needed for constipation 06/11/16  Yes Collier Salina, MD  VOLTAREN 1 % GEL APPLY 2 GRAMS TOPICALLY 4 TIMES DAILY. 06/09/16  Yes Collier Salina, MD    ROS:  Out of a complete 14 system review of symptoms, the patient complains only of the following symptoms, and all other reviewed systems are negative.  Shortness of breath Swollen abdomen Insomnia, daytime sleepiness Headache  Blood pressure 152/88, pulse 84, height 5\' 8"  (1.727 m), weight 214 lb (97.07 kg), last menstrual period 12/03/2011.  Physical Exam  General: The patient is alert and cooperative at the time of the examination. The patient is moderately obese.  Neuromuscular: Internal and external rotation of the hips elicits minimal discomfort.  Skin: No significant peripheral edema is noted.   Neurologic Exam  Mental status: The patient is alert and oriented x 3 at the time of the examination. The patient has apparent normal recent and remote memory, with an apparently normal attention span and concentration ability.   Cranial nerves: Facial symmetry is present. Speech is normal, no aphasia or dysarthria is noted. Extraocular movements are full. Visual fields are full.  Motor: The patient has good strength in all 4 extremities.  Sensory examination: Soft touch sensation is symmetric on the face, arms, and legs.  Coordination: The patient has good finger-nose-finger and heel-to-shin bilaterally.  Gait and station: The patient has a normal gait. Tandem gait is slightly unsteady. Romberg is negative. No drift is seen.  Reflexes: Deep tendon reflexes are symmetric.   Assessment/Plan:  1. Neurosarcoidosis  2. Chronic hip and left leg pain  3. Chronic daily headache  The patient will  be set up for MRI evaluation of the lumbar spine. Prior CT scan of the left hip was unremarkable with exception of minimal arthritis. The patient will have blood work done today, she will continue the methotrexate. A prescription for hydrocodone was given. The patient will go up on the gabapentin taking 300 mg in the morning, 600 mg in the evening. She will follow-up in 6 months, sooner if needed. She will go up on the Protonix dosing to 40 mg daily. If the burning sensations in the throat do not improve, ENT evaluation may be required.  Jill Alexanders MD 06/19/2016 8:21 PM  [facility] [address] [place], [place] [address]  Phone [phone] Fax [phone]

## 2016-06-20 ENCOUNTER — Telehealth: Payer: Self-pay

## 2016-06-20 LAB — CBC WITH DIFFERENTIAL/PLATELET
BASOS: 1 %
Basophils Absolute: 0 10*3/uL (ref 0.0–0.2)
EOS (ABSOLUTE): 0.1 10*3/uL (ref 0.0–0.4)
EOS: 1 %
HEMATOCRIT: 38.4 % (ref 34.0–46.6)
HEMOGLOBIN: 12.3 g/dL (ref 11.1–15.9)
IMMATURE GRANULOCYTES: 0 %
Immature Grans (Abs): 0 10*3/uL (ref 0.0–0.1)
Lymphocytes Absolute: 1.7 10*3/uL (ref 0.7–3.1)
Lymphs: 33 %
MCH: 27.8 pg (ref 26.6–33.0)
MCHC: 32 g/dL (ref 31.5–35.7)
MCV: 87 fL (ref 79–97)
MONOCYTES: 8 %
Monocytes Absolute: 0.4 10*3/uL (ref 0.1–0.9)
NEUTROS PCT: 57 %
Neutrophils Absolute: 3 10*3/uL (ref 1.4–7.0)
PLATELETS: 314 10*3/uL (ref 150–379)
RBC: 4.42 x10E6/uL (ref 3.77–5.28)
RDW: 15.5 % — ABNORMAL HIGH (ref 12.3–15.4)
WBC: 5.2 10*3/uL (ref 3.4–10.8)

## 2016-06-20 LAB — COMPREHENSIVE METABOLIC PANEL
ALBUMIN: 4.3 g/dL (ref 3.5–5.5)
ALT: 18 IU/L (ref 0–32)
AST: 17 IU/L (ref 0–40)
Albumin/Globulin Ratio: 1.7 (ref 1.2–2.2)
Alkaline Phosphatase: 60 IU/L (ref 39–117)
BUN / CREAT RATIO: 24 — AB (ref 9–23)
BUN: 18 mg/dL (ref 6–24)
Bilirubin Total: <0.2 mg/dL (ref 0.0–1.2)
CALCIUM: 9.3 mg/dL (ref 8.7–10.2)
CO2: 26 mmol/L (ref 18–29)
CREATININE: 0.75 mg/dL (ref 0.57–1.00)
Chloride: 103 mmol/L (ref 96–106)
GFR calc Af Amer: 109 mL/min/{1.73_m2} (ref >59)
GFR, EST NON AFRICAN AMERICAN: 95 mL/min/{1.73_m2} (ref >59)
GLOBULIN, TOTAL: 2.6 g/dL (ref 1.5–4.5)
Glucose: 114 mg/dL — ABNORMAL HIGH (ref 65–99)
Potassium: 4.9 mmol/L (ref 3.5–5.2)
SODIUM: 143 mmol/L (ref 134–144)
Total Protein: 6.9 g/dL (ref 6.0–8.5)

## 2016-06-20 NOTE — Telephone Encounter (Signed)
-----   Message from Kathrynn Ducking, MD sent at 06/20/2016  6:38 AM EDT -----  The blood work results are unremarkable. Please call the patient.   ----- Message -----    From: [facility] Lab Results In Interface    Sent: 06/20/2016   5:40 AM      To: Kathrynn Ducking, MD

## 2016-06-20 NOTE — Telephone Encounter (Signed)
Called pt w/ unremarkable lab results. Verbalized understanding and appreciation for call. 

## 2016-06-26 ENCOUNTER — Other Ambulatory Visit: Payer: Self-pay | Admitting: Internal Medicine

## 2016-06-27 NOTE — Telephone Encounter (Signed)
Overdue for TSH check. Last checked 12/2014. Although she was seen as recently as June, her thyroid has not been addressed. Was getting a 2 month supply per fill and mostly compliant based on dates. Will fill  Needs PCP appt during next 3 months

## 2016-07-03 ENCOUNTER — Ambulatory Visit
Admission: RE | Admit: 2016-07-03 | Discharge: 2016-07-03 | Disposition: A | Discharge status: Home / Self Care | Payer: Medicaid Other | Source: Ambulatory Visit | Attending: Neurology | Admitting: Neurology

## 2016-07-03 DIAGNOSIS — M549 Dorsalgia, unspecified: Secondary | ICD-10-CM | POA: Diagnosis not present

## 2016-07-03 DIAGNOSIS — G8929 Other chronic pain: Secondary | ICD-10-CM

## 2016-07-06 ENCOUNTER — Telehealth: Payer: Self-pay | Admitting: Neurology

## 2016-07-06 DIAGNOSIS — M5416 Radiculopathy, lumbar region: Secondary | ICD-10-CM

## 2016-07-06 NOTE — Telephone Encounter (Signed)
I called the patient. The MRI of the Lumbar spine shows probable impingement of the left L4 nerve root. ? transforaminal epidural injection.at that level. She is to call if she desires this treatment.   MRI lumbar 07/05/16:  IMPRESSION:  Abnormal MRI scan of the lumbar spine showing severe disc and facet degenerative change mainly at L4-5 where there is severe left-sided foraminal and mild canal narrowing resulting in likely impingement on the exiting nerve root.

## 2016-07-11 ENCOUNTER — Ambulatory Visit (INDEPENDENT_AMBULATORY_CARE_PROVIDER_SITE_OTHER): Payer: Medicaid Other | Admitting: Internal Medicine

## 2016-07-11 ENCOUNTER — Other Ambulatory Visit: Payer: Self-pay | Admitting: Internal Medicine

## 2016-07-11 VITALS — BP 127/72 | HR 73 | Temp 98.3°F | Ht 68.0 in | Wt 214.1 lb

## 2016-07-11 DIAGNOSIS — M25552 Pain in left hip: Secondary | ICD-10-CM

## 2016-07-11 DIAGNOSIS — G8929 Other chronic pain: Secondary | ICD-10-CM

## 2016-07-11 MED ORDER — KETOROLAC TROMETHAMINE 30 MG/ML IJ SOLN
60.0000 mg | Freq: Once | INTRAMUSCULAR | Status: AC
  Administered 2016-07-11: 60 mg via INTRAMUSCULAR

## 2016-07-11 NOTE — Assessment & Plan Note (Addendum)
Patient is here for 2 week duration of left hip pain flareup. She does not know how it started, but she says she has had chronic lefthip pain and leg pain for many years and also has been seen here in the past.  She has a history of neurosarcoidosis and follows with Neurology. They ordred an MRI of lumbar Spine which showed  severe disc and facet degenerative change mainly at L4-5 where there is severe left-sided foraminal and mild canal narrowing resulting in likely impingement on the exiting nerve root. This is likely the cause of her pain. She says she has a hard time sleeping. Toradol had helped int he past  -Given Toradol 60 mg IM injection  -Asked pt to follow up with neurology as they are thinking about an epidural foraminal steroid injection.

## 2016-07-11 NOTE — Patient Instructions (Addendum)
Thank you for your visit today  Please follow up with the Neurologist as your MRi shows some nerve impingement which is the cause of your left hip and leg pain

## 2016-07-11 NOTE — Progress Notes (Signed)
   CC: chronic left hip pain HPI: [name] is a 50 y.o. woman with PMH noted below here for 2 week left hip pain but more chronic  Please see Problem List/A&P for the status of the patient's chronic medical problems   Past Medical History:  Diagnosis Date  . Anemia   . Chronic fatigue   . DM (diabetes mellitus) ([place])   . Gait disturbance   . GERD (gastroesophageal reflux disease)   . Headache(784.0)   . HTN (hypertension)   . Nephrolithiasis   . Obesity   . OSA (obstructive sleep apnea)   . Osteoporosis   . Peripheral neuropathy ([place])   . Prolonged QT interval   . Sarcoidosis ([facility])     Review of Systems: Denies fevers, chills Denies SOB, dizziness Denies n/v Has chronic left hip pain and tingling down left leg  Physical Exam: Vitals:   07/11/16 0916  BP: 127/72  Pulse: 73  Temp: 98.3 F (36.8 C)  TempSrc: Oral  Weight: 214 lb 1.6 oz (97.1 kg)  Height: 5\' 8"  (1.727 m)    General: A&O, in NAD  CV: RRR, normal s1, s2, no m/r/g,  Resp: equal and symmetric breath sounds, no wheezing or crackles  Abdomen: soft, nontender, nondistended, +BS Back: tender at paraspinal l4-l5 muscles. l Hip: left anterior hip tender to palpation Str leg test positive for left leg, neg for right leg  Assessment & Plan:   See encounters tab for problem based medical decision making. Patient discussed with Dr. Dareen Piano

## 2016-07-14 ENCOUNTER — Telehealth: Payer: Self-pay

## 2016-07-14 NOTE — Telephone Encounter (Signed)
I called the patient. I think that if she does not wish to try the epidural, the best option is to consider surgery. I will be happy to make the referral if she desires.

## 2016-07-14 NOTE — Progress Notes (Signed)
Internal Medicine Clinic Attending  Case discussed with Dr. Saraiya at the time of the visit.  We reviewed the resident's history and exam and pertinent patient test results.  I agree with the assessment, diagnosis, and plan of care documented in the resident's note.  

## 2016-07-14 NOTE — Telephone Encounter (Signed)
Talked to pt. Reports increased pain/numbness on the left side. Does not want to have another epidural injection as it severely interfered w/ her blood sugar in the past. Encouraged her to continue gabapentin as she stopped taking it d/t the med "not helping." Says that she's unable to sleep. Has taken so much OTC ibuprofen that she has GI upset. Voltaren gel does not offer relief and she doesn't have any Norco left. Would like to discuss further w/ Dr. Jannifer Franklin when he returns.

## 2016-07-14 NOTE — Telephone Encounter (Signed)
Needs to speak with a nurse regarding back and hip pain.

## 2016-07-14 NOTE — Telephone Encounter (Signed)
Pt called to get MRI results. Please call to discuss

## 2016-07-14 NOTE — Telephone Encounter (Signed)
Chart reviewed, EMG nerve conduction study was normal in January 2017, I personally reviewed MRI lumbar August 2017, which did show severe disc and facet degenerative change mainly at L4-5 where there is severe left-sided foraminal and mild canal narrowing resulting in likely impingement on the exiting nerve root  Please let patient know, may try Lyrica 100 mg 3 times a day,

## 2016-07-14 NOTE — Telephone Encounter (Signed)
Called pt to let her know that chart was reviewed by work-in MD and suggested that she try Lyrica. However, pt says that she has tried Lyrica in the past and it was not helpful. Says that she would just like "to have this fixed" so that she doesn't have to do steroid shots or more medication.

## 2016-07-15 NOTE — Telephone Encounter (Signed)
Wants referral to ortho to have hip surgery

## 2016-07-16 ENCOUNTER — Other Ambulatory Visit: Payer: Self-pay

## 2016-07-16 ENCOUNTER — Ambulatory Visit (INDEPENDENT_AMBULATORY_CARE_PROVIDER_SITE_OTHER): Payer: Medicaid Other | Admitting: Internal Medicine

## 2016-07-16 VITALS — BP 131/72 | HR 73 | Temp 98.2°F | Ht 68.0 in | Wt 216.7 lb

## 2016-07-16 DIAGNOSIS — M5116 Intervertebral disc disorders with radiculopathy, lumbar region: Secondary | ICD-10-CM | POA: Diagnosis present

## 2016-07-16 DIAGNOSIS — M5386 Other specified dorsopathies, lumbar region: Secondary | ICD-10-CM

## 2016-07-16 MED ORDER — LIDOCAINE 5 % EX PTCH: 1.0000 | MEDICATED_PATCH | CUTANEOUS | 0 refills | Status: DC

## 2016-07-16 MED ORDER — GABAPENTIN 300 MG PO CAPS: ORAL_CAPSULE | ORAL | 1 refills | Status: DC

## 2016-07-16 MED ORDER — SENNOSIDES-DOCUSATE SODIUM 8.6-50 MG PO TABS: 2.0000 | ORAL_TABLET | Freq: Two times a day (BID) | ORAL | 1 refills | Status: DC

## 2016-07-16 NOTE — Progress Notes (Signed)
   CC: sciatica type pain HPI: [name] is a 50 y.o. woman with PMH noted below here for follow up for her sciatica and referral to neurosurgery  Please see Problem List/A&P for the status of the patient's chronic medical problems   Past Medical History:  Diagnosis Date  . Anemia   . Chronic fatigue   . DM (diabetes mellitus) ([place])   . Gait disturbance   . GERD (gastroesophageal reflux disease)   . Headache(784.0)   . HTN (hypertension)   . Nephrolithiasis   . Obesity   . OSA (obstructive sleep apnea)   . Osteoporosis   . Peripheral neuropathy ([place])   . Prolonged QT interval   . Sarcoidosis ([place])     Review of Systems: Denies fevers/ chills, fatigue. Has struggles with weight Denies incontinence or other red flags Has chronic left lumbar and hip pain with radicular symptoms  Physical Exam: Vitals:   07/16/16 0823  BP: 131/72  Pulse: 73  Temp: 98.2 F (36.8 C)  TempSrc: Oral  SpO2: 100%  Weight: 216 lb 11.2 oz (98.3 kg)  Height: 5\' 8"  (1.727 m)    General: A&O, in NAD CV: RRR, normal s1, s2, no m/r/g, Resp: equal and symmetric breath sounds, no wheezing or crackles  Abdomen: soft, nontender, nondistended, +BS Back: paraspinal muscles tender to palpation over lumbar area. On Friday- the str leg test was positive of left side.   Assessment & Plan:   See encounters tab for problem based medical decision making. Patient seen with Dr. Evette Doffing

## 2016-07-16 NOTE — Telephone Encounter (Signed)
I called patient. We will make the referral to neurosurgery, the patient will be sent to [facility].

## 2016-07-16 NOTE — Assessment & Plan Note (Signed)
Patient is here for follow up from Friday. Says her left lumbar pain and sciatica returned as the effects of toradol wore off. She had called Neurology back and declined epidural steroid injection and Lyrica.  We explained to her that she has severe degenerative changes of the lumbar spine and there was not really any magic cure for this. There was not one single medicine that could cure her pain, and explained that the surgery also may not cure her pain completely, and that there are risks with any surgery. She said she is very functional at baseline and can walk upto 2 miles, but it is the pain that is bothering her.  On exam, her pain is consistent with sciatica associated with degenerative changes of the lumbar spine. We explained that her functional status is very encouraging.  She is willing to do trial of gabapentin, as well as follow up with neurosurgery.  -Start gabapentin 600 mg at bedtime -follow up with neurosurgery

## 2016-07-16 NOTE — Telephone Encounter (Signed)
Was seen in clinic today where concern was addressed.

## 2016-07-16 NOTE — Telephone Encounter (Signed)
Patient is calling to get referred to a surgeon.

## 2016-07-16 NOTE — Addendum Note (Signed)
Addended by: Margette Fast on: 07/16/2016 09:44 AM   Modules accepted: Orders

## 2016-07-16 NOTE — Patient Instructions (Addendum)
Thank you for your visit today Please restart the gabapentin- 600 mg at night. Can take additional as needed and you can adjust by yourself. Max dose is 3600 mg. Do not drive while taking as can make you drowsy.  Please follow up with the neurosurgeon

## 2016-07-17 NOTE — Progress Notes (Signed)
Internal Medicine Clinic Attending  I saw and evaluated the patient.  I personally confirmed the key portions of the history and exam documented by Dr. Tiburcio Pea and I reviewed pertinent patient test results.  The assessment, diagnosis, and plan were formulated together and I agree with the documentation in the resident's note.  MRI of lumbar spine ordered by neurology office showed severe degenerative disc and facet disease, consistent with her paraspinal pain with radicular component. We talked about setting appropriate expectations, I doubt any combination or meds or procedures will leave her pain free. We talked about maximizing functional status, she already walks 1-2 miles a few times weekly. She will follow up with neurosurgery for a surgical consultation, but she is pretty risk averse and may do just fine with supportive care as well.

## 2016-08-08 ENCOUNTER — Other Ambulatory Visit: Payer: Self-pay | Admitting: Neurosurgery

## 2016-08-08 ENCOUNTER — Telehealth: Payer: Self-pay | Admitting: Neurology

## 2016-08-08 DIAGNOSIS — G8929 Other chronic pain: Secondary | ICD-10-CM

## 2016-08-08 DIAGNOSIS — M25552 Pain in left hip: Principal | ICD-10-CM

## 2016-08-08 MED ORDER — HYDROCODONE-ACETAMINOPHEN 5-325 MG PO TABS: 1.0000 | ORAL_TABLET | Freq: Four times a day (QID) | ORAL | 0 refills | Status: DC | PRN

## 2016-08-08 NOTE — Telephone Encounter (Signed)
I called patient. She will be going in for cervical spine surgery. She is having a lot of pain, unable to sleep well. I will write another prescription for the hydrocodone.

## 2016-08-08 NOTE — Telephone Encounter (Signed)
Patient reports pain in back and hip down the leg and HA's.pt said she is in so much pain she can not sleep.  Advised that the nurse would call if there was any other questions.

## 2016-08-11 NOTE — Telephone Encounter (Signed)
Rx printed, signed, up front for pick-up. 

## 2016-08-13 ENCOUNTER — Encounter (HOSPITAL_COMMUNITY): Payer: Self-pay

## 2016-08-13 ENCOUNTER — Encounter (HOSPITAL_COMMUNITY)
Admission: RE | Admit: 2016-08-13 | Discharge: 2016-08-13 | Disposition: A | Discharge status: Home / Self Care | Payer: Medicaid Other | Source: Ambulatory Visit | Attending: Neurosurgery | Admitting: Neurosurgery

## 2016-08-13 DIAGNOSIS — M5416 Radiculopathy, lumbar region: Secondary | ICD-10-CM | POA: Insufficient documentation

## 2016-08-13 DIAGNOSIS — Z01812 Encounter for preprocedural laboratory examination: Secondary | ICD-10-CM | POA: Diagnosis not present

## 2016-08-13 DIAGNOSIS — Z01818 Encounter for other preprocedural examination: Secondary | ICD-10-CM | POA: Insufficient documentation

## 2016-08-13 HISTORY — DX: Unspecified osteoarthritis, unspecified site: M19.90

## 2016-08-13 HISTORY — DX: Hypothyroidism, unspecified: E03.9

## 2016-08-13 HISTORY — DX: Malignant (primary) neoplasm, unspecified: C80.1

## 2016-08-13 LAB — CBC
HCT: 42.6 % (ref 36.0–46.0)
HEMOGLOBIN: 13.4 g/dL (ref 12.0–15.0)
MCH: 27.9 pg (ref 26.0–34.0)
MCHC: 31.5 g/dL (ref 30.0–36.0)
MCV: 88.6 fL (ref 78.0–100.0)
Platelets: 321 10*3/uL (ref 150–400)
RBC: 4.81 MIL/uL (ref 3.87–5.11)
RDW: 14.5 % (ref 11.5–15.5)
WBC: 5.6 10*3/uL (ref 4.0–10.5)

## 2016-08-13 LAB — SURGICAL PCR SCREEN
MRSA, PCR: NEGATIVE
Staphylococcus aureus: NEGATIVE

## 2016-08-13 LAB — BASIC METABOLIC PANEL
ANION GAP: 9 (ref 5–15)
BUN: 17 mg/dL (ref 6–20)
CALCIUM: 9.1 mg/dL (ref 8.9–10.3)
CHLORIDE: 105 mmol/L (ref 101–111)
CO2: 24 mmol/L (ref 22–32)
Creatinine, Ser: 0.7 mg/dL (ref 0.44–1.00)
GFR calc non Af Amer: >60 mL/min (ref >60)
Glucose, Bld: 102 mg/dL — ABNORMAL HIGH (ref 65–99)
Potassium: 4.1 mmol/L (ref 3.5–5.1)
SODIUM: 138 mmol/L (ref 135–145)

## 2016-08-13 LAB — GLUCOSE, CAPILLARY: GLUCOSE-CAPILLARY: 133 mg/dL — AB (ref 65–99)

## 2016-08-13 NOTE — Pre-Procedure Instructions (Addendum)
[name] [name]  08/13/2016      [id number] - [place], [place] [id number] [place] [id number] Phone: [phone] Fax: [phone]    Your procedure is scheduled on 08/20/2016.  Report to [facility] Admitting at 6:30 A.M.  Call this number if you have problems the morning of surgery:  [phone]   Remember:  Do not eat food or drink liquids after midnight.  On Tuesday at midnight   Take these medicines the morning of surgery with A SIP OF WATER : Levothyroxine, Vicodin, Protonix     Stop Aspirin on Friday 08/15/2016    Do not wear jewelry, make-up or nail polish.   Do not wear lotions, powders, or perfumes, or deoderant.   Do not shave 48 hours prior to surgery.     Do not bring valuables to the hospital.   [facility] is not responsible for any belongings or valuables.  Contacts, dentures or bridgework may not be worn into surgery.  Leave your suitcase in the car.  After surgery it may be brought to your room.  For patients admitted to the hospital, discharge time will be determined by your treatment team.  Patients discharged the day of surgery will not be allowed to drive home.   Name and phone number of your driver:   With mother & spouse     How to Manage Your Diabetes Before and After Surgery  Why is it important to control my blood sugar before and after surgery? . Improving blood sugar levels before and after surgery helps healing and can limit problems. . A way of improving blood sugar control is eating a healthy diet by: o  Eating less sugar and carbohydrates o  Increasing activity/exercise o  Talking with your doctor about reaching your blood sugar goals . High blood sugars (greater than 180 mg/dL) can raise your risk of infections and slow your recovery, so you will need to focus on controlling your diabetes during the weeks before surgery. . Make sure that  the doctor who takes care of your diabetes knows about your planned surgery including the date and location.  How do I manage my blood sugar before surgery? . Check your blood sugar at least 4 times a day, starting 2 days before surgery, to make sure that the level is not too high or low. o Check your blood sugar the morning of your surgery when you wake up and every 2 hours until you get to the Short Stay unit. . If your blood sugar is less than 70 mg/dL, you will need to treat for low blood sugar: o Do not take insulin. o Treat a low blood sugar (less than 70 mg/dL) with  cup of clear juice (cranberry or apple), 4 glucose tablets, OR glucose gel. o Recheck blood sugar in 15 minutes after treatment (to make sure it is greater than 70 mg/dL). If your blood sugar is not greater than 70 mg/dL on recheck, call [phone] for further instructions. . Report your blood sugar to the short stay nurse when you get to Short Stay.  . If you are admitted to the hospital after surgery: o Your blood sugar will be checked by the staff and you will probably be given insulin after surgery (instead of oral diabetes medicines) to make sure you have good blood sugar levels. o The goal for blood sugar control after surgery is 80-180  mg/dL.              WHAT DO I DO ABOUT MY DIABETES MEDICATION?   Marland Kitchen Do not take oral diabetes medicines (pills) the morning of surgery.  . .         Other Instructions:          Patient Signature:  Date:   Nurse Signature:  Date:   Reviewed and Endorsed by [facility] Patient Education Committee, August 2015 Special instructions:  Special Instructions:  - Preparing for Surgery  Before surgery, you can play an important role.  Because skin is not sterile, your skin needs to be as free of germs as possible.  You can reduce the number of germs on you skin by washing with CHG (chlorahexidine gluconate) soap before surgery.  CHG is an antiseptic  cleaner which kills germs and bonds with the skin to continue killing germs even after washing.  Please DO NOT use if you have an allergy to CHG or antibacterial soaps.  If your skin becomes reddened/irritated stop using the CHG and inform your nurse when you arrive at Short Stay.  Do not shave (including legs and underarms) for at least 48 hours prior to the first CHG shower.  You may shave your face.  Please follow these instructions carefully:   1.  Shower with CHG Soap the night before surgery and the  morning of Surgery.  2.  If you choose to wash your hair, wash your hair first as usual with your  normal shampoo.  3.  After you shampoo, rinse your hair and body thoroughly to remove the  Shampoo.  4.  Use CHG as you would any other liquid soap.  You can apply chg directly to the skin and wash gently with scrungie or a clean washcloth.  5.  Apply the CHG Soap to your body ONLY FROM THE NECK DOWN.    Do not use on open wounds or open sores.  Avoid contact with your eyes, ears, mouth and genitals (private parts).  Wash genitals (private parts)   with your normal soap.  6.  Wash thoroughly, paying special attention to the area where your surgery will be performed.  7.  Thoroughly rinse your body with warm water from the neck down.  8.  DO NOT shower/wash with your normal soap after using and rinsing off   the CHG Soap.  9.  Pat yourself dry with a clean towel.            10.  Wear clean pajamas.            11.  Place clean sheets on your bed the night of your first shower and do not sleep with pets.  Day of Surgery  Do not apply any lotions/deodorants the morning of surgery.  Please wear clean clothes to the hospital/surgery center.  Please read over the following fact sheets that you were given. Pain Booklet and MRSA Information

## 2016-08-14 ENCOUNTER — Other Ambulatory Visit: Payer: Self-pay | Admitting: Internal Medicine

## 2016-08-14 LAB — HEMOGLOBIN A1C
Hgb A1c MFr Bld: 6.7 % — ABNORMAL HIGH (ref 4.8–5.6)
MEAN PLASMA GLUCOSE: 146 mg/dL

## 2016-08-14 NOTE — Progress Notes (Signed)
Anesthesia Chart Review:  Pt is a 50 year old female scheduled for, L4-5 microdiskectomy on 08/20/2016 with Leeroy Cha, MD  - Cardiologist is Kirk Ruths, MD, last office visit 11/05/15.   PMH includes:  HTN, prolonged QT, DM, OSA, hypothyroidism, thyroid cancer (s/p thyroidectomy), sarcoidosis, anemia, GERD. Never smoker. BMI 32  Medications include: ASA, lipitor, levothyroxine, losartan, metformin, methotrexate, protonix  Preoperative labs reviewed.  HgbA1c 6.7, glucose 102  CXR 06/08/16: No active cardiopulmonary disease  EKG 11/05/15: Sinus rhythm with short PR interval, normal axis, nonspecific ST changes.  Stress echo 03/15/13:  - Stress ECG conclusions: Nonspecific < 1 mm ST depression V3-V5. - Staged echo: Normal echo stress - Impressions: No evidence for ischemia by echo imaging. Nonspecific ECGchanges.  If no changes, I anticipate pt can proceed with surgery as scheduled.   Willeen Cass, FNP-BC [facility]/Anesthesiology Phone: [phone] 08/14/2016 3:21 PM

## 2016-08-15 ENCOUNTER — Encounter: Payer: Self-pay | Admitting: Internal Medicine

## 2016-08-15 ENCOUNTER — Ambulatory Visit (INDEPENDENT_AMBULATORY_CARE_PROVIDER_SITE_OTHER): Payer: Medicaid Other | Admitting: Internal Medicine

## 2016-08-15 VITALS — BP 121/63 | HR 90 | Temp 98.2°F | Ht 68.0 in | Wt 214.1 lb

## 2016-08-15 DIAGNOSIS — Z0000 Encounter for general adult medical examination without abnormal findings: Secondary | ICD-10-CM

## 2016-08-15 DIAGNOSIS — M5432 Sciatica, left side: Secondary | ICD-10-CM | POA: Diagnosis not present

## 2016-08-15 DIAGNOSIS — F419 Anxiety disorder, unspecified: Secondary | ICD-10-CM

## 2016-08-15 DIAGNOSIS — Z79899 Other long term (current) drug therapy: Secondary | ICD-10-CM | POA: Diagnosis not present

## 2016-08-15 DIAGNOSIS — Z23 Encounter for immunization: Secondary | ICD-10-CM | POA: Diagnosis not present

## 2016-08-15 DIAGNOSIS — E039 Hypothyroidism, unspecified: Secondary | ICD-10-CM | POA: Diagnosis not present

## 2016-08-15 DIAGNOSIS — M5386 Other specified dorsopathies, lumbar region: Secondary | ICD-10-CM

## 2016-08-15 MED ORDER — SENNOSIDES-DOCUSATE SODIUM 8.6-50 MG PO TABS: 2.0000 | ORAL_TABLET | Freq: Two times a day (BID) | ORAL | 2 refills | Status: DC

## 2016-08-15 NOTE — Patient Instructions (Signed)
It was a pleasure to see you today [name] [name]. I will let you know right away if there is an unusual result with your thyroid hormone (TSH) today. Otherwise I would like to see you again within a few weeks of your upcoming surgery to see how we are making progress.

## 2016-08-15 NOTE — Progress Notes (Signed)
   CC: Follow up for for hypothyroidism  HPI:  [name] is a 50 y.o. woman with medical history detailed below who presents today for rechecking of her thyroid replacement therapy. However of significance she suffered worsening of chronic hip pain with radiculopathy that is now evaluated with MRI showing facet arthropathy and nerve root compression and plans for surgery next week. She feels her energy level is low and has been gaining significant weight over the past few months. She does not think her diet has changed very much although her activity level is very reduced due to pain.  See problem based assessment and plan below for additional details.  Past Medical History:  Diagnosis Date  . Anemia   . Arthritis    lumbar region- radiculopathy  . Cancer [facility]) 2013    Thyroid Ca-thyroidectomy  . Chronic fatigue   . DM (diabetes mellitus) ([place])   . Gait disturbance   . GERD (gastroesophageal reflux disease)   . Headache(784.0)   . HTN (hypertension)   . Hypothyroidism   . Nephrolithiasis   . Obesity   . OSA (obstructive sleep apnea)    last test- many yrs. ago, done at [facility], no CPAP in use  . Osteoporosis   . Peripheral neuropathy ([place])   . Prolonged QT interval   . 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  Eyes: Negative for blurred vision.  Respiratory: Negative for shortness of breath.   Cardiovascular: Negative for chest pain and leg swelling.  Gastrointestinal: Negative for diarrhea.  Musculoskeletal: Positive for back pain and joint pain. Negative for falls.  Skin: Negative for rash.  Neurological: Positive for headaches. Negative for tremors and focal weakness.  Endo/Heme/Allergies: Positive for environmental allergies.  Psychiatric/Behavioral: The patient is nervous/anxious.      Physical Exam:  Vitals:   08/15/16 1513  BP: 121/63  Pulse: 90    Temp: 98.2 F (36.8 C)  TempSrc: Oral  SpO2: 100%  Weight: 214 lb 1.6 oz (97.1 kg)  Height: 5\' 8"  (1.727 m)    GENERAL- alert, co-operative, NAD HEENT- Wearing wig, no cervical LN enlargement CARDIAC- RRR, no murmurs, rubs or gallops. RESP- CTAB, no wheezes or crackles. BACK- Paraspinal muscle tenderness to palpation bilaterally NEURO-  Strength upper and lower extremities- 5/5, Sensation intact globally, left left examination limited by pain with fairly minimal passive ROM EXTREMITIES- pulse 2+, symmetric, no pedal edema. SKIN- Small pigmented lesion on left anterior shin PSYCH- Normal mood and affect, appropriate thought content and speech.    Assessment & Plan:   See Encounters Tab for problem based charting.  Patient discussed with Dr. Dareen Piano

## 2016-08-16 LAB — TSH: TSH: 1.46 u[IU]/mL (ref 0.450–4.500)

## 2016-08-18 ENCOUNTER — Other Ambulatory Visit: Payer: Self-pay | Admitting: Internal Medicine

## 2016-08-18 NOTE — Assessment & Plan Note (Signed)
She now feels that her pain has worsened preventing her from walking around her normal amount, sleeping through the night, and recent toradol shots were no longer effective at improving her pain. Based on neurosurgery recommendations she is now planned for a procedure next week. She has a fair degree of anxiety and I caution she is at a somewhat elevated risk of complication due to her immunosuppression.  Given how little she tolerates use of the left leg even based on my exam today and describes worsening symptoms I spoke with her for several minutes that surgery does have risks and likely will not achieve a perfect outcome but is a reasonable plan for her pain.

## 2016-08-18 NOTE — Assessment & Plan Note (Signed)
Flu vaccine given today. 

## 2016-08-18 NOTE — Assessment & Plan Note (Signed)
Her reported weight gain is not actually demonstrated on recorded weight here in clinic visits. She does not have other obvious signs of hypothyroidism. TSH check today is again normal on synthroid 137 mcg.  Patient to continue her current medication.

## 2016-08-19 NOTE — H&P (Signed)
[name] [name] is an 50 y.o. female.   Chief Complaint: left leg pain HPI: patient with 2 years history of back pain with radiation to the left leg no better with conservative treatment ,not able to work.had a mri and saw [name]  Past Medical History:  Diagnosis Date  . Anemia   . Arthritis    lumbar region- radiculopathy  . Cancer [facility]) 2013    Thyroid Ca-thyroidectomy  . Chronic fatigue   . DM (diabetes mellitus) ([place])   . Gait disturbance   . GERD (gastroesophageal reflux disease)   . Headache(784.0)   . HTN (hypertension)   . Hypothyroidism   . Nephrolithiasis   . Obesity   . OSA (obstructive sleep apnea)    last test- many yrs. ago, done at [facility], no CPAP in use  . Osteoporosis   . Peripheral neuropathy ([place])   . Prolonged QT interval   . 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 BRAIN SURGERY  2001   for sarcoidosis  . BREAST SURGERY Bilateral    biopsies   . DILATION AND CURETTAGE OF UTERUS    . HYSTEROSCOPY    . PARTIAL HYSTERECTOMY  2013  . ROTATOR CUFF REPAIR Right    07/2013  . Suboccipital craniotomy    . THYROIDECTOMY     Precancerous lesion    Family History  Problem Relation Age of Onset  . Heart disease Mother     questionable CAD and arrythmias   . Cancer Mother     Breast cancer  . Venous thrombosis Sister   . Diabetes Sister   . Heart disease Sister   . Diabetes Brother   . Heart disease Brother   . Cancer Maternal Aunt   . Venous thrombosis Sister   . Diabetes Sister   . Heart disease Sister    Social History:  reports that she has never smoked. She has never used smokeless tobacco. She reports that she does not drink alcohol or use drugs.  Allergies: No Known Allergies  No prescriptions prior to admission.    No results found for this or any previous visit (from the past 48 hour(s)). No results found.  Review of Systems  Constitutional: Negative.    HENT: Negative.   Eyes: Negative.   Respiratory: Negative.   Cardiovascular: Negative.   Gastrointestinal: Positive for nausea.  Genitourinary: Negative.   Musculoskeletal: Positive for back pain.  Skin: Negative.   Neurological: Positive for sensory change and focal weakness.  Endo/Heme/Allergies: Negative.   Psychiatric/Behavioral: Negative.     Last menstrual period 12/03/2011. Physical Exam hent,nl. Neck, n. Cv,nl.lungs,clear. Abdomen,nl. Extremities, nl. NEURO weakness of DF of the left foot. Sensory,decrease of feeling al l5 dermatome. Dtr,nl. 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 She is aware of risks and benefits  Floyce Stakes, MD 08/19/2016, 7:47 PM

## 2016-08-20 ENCOUNTER — Ambulatory Visit (HOSPITAL_COMMUNITY): Payer: Medicaid Other | Admitting: Emergency Medicine

## 2016-08-20 ENCOUNTER — Ambulatory Visit (HOSPITAL_COMMUNITY): Payer: Medicaid Other | Admitting: Certified Registered Nurse Anesthetist

## 2016-08-20 ENCOUNTER — Encounter (HOSPITAL_COMMUNITY)
Admission: RE | Disposition: A | Discharge status: Home / Self Care | Payer: Self-pay | Source: Ambulatory Visit | Attending: Neurosurgery

## 2016-08-20 ENCOUNTER — Observation Stay (HOSPITAL_COMMUNITY)
Admission: RE | Admit: 2016-08-20 | Discharge: 2016-08-22 | Disposition: A | Discharge status: Home / Self Care | Payer: Medicaid Other | Source: Ambulatory Visit | Attending: Neurosurgery | Admitting: Neurosurgery

## 2016-08-20 ENCOUNTER — Ambulatory Visit (HOSPITAL_COMMUNITY): Payer: Medicaid Other

## 2016-08-20 ENCOUNTER — Encounter (HOSPITAL_COMMUNITY): Payer: Self-pay | Admitting: Urology

## 2016-08-20 DIAGNOSIS — M5126 Other intervertebral disc displacement, lumbar region: Secondary | ICD-10-CM | POA: Diagnosis present

## 2016-08-20 DIAGNOSIS — Z7984 Long term (current) use of oral hypoglycemic drugs: Secondary | ICD-10-CM | POA: Insufficient documentation

## 2016-08-20 DIAGNOSIS — Z85850 Personal history of malignant neoplasm of thyroid: Secondary | ICD-10-CM | POA: Diagnosis not present

## 2016-08-20 DIAGNOSIS — E669 Obesity, unspecified: Secondary | ICD-10-CM | POA: Diagnosis not present

## 2016-08-20 DIAGNOSIS — I10 Essential (primary) hypertension: Secondary | ICD-10-CM | POA: Diagnosis not present

## 2016-08-20 DIAGNOSIS — E890 Postprocedural hypothyroidism: Secondary | ICD-10-CM | POA: Diagnosis not present

## 2016-08-20 DIAGNOSIS — R262 Difficulty in walking, not elsewhere classified: Secondary | ICD-10-CM

## 2016-08-20 DIAGNOSIS — G4733 Obstructive sleep apnea (adult) (pediatric): Secondary | ICD-10-CM | POA: Diagnosis not present

## 2016-08-20 DIAGNOSIS — Z419 Encounter for procedure for purposes other than remedying health state, unspecified: Secondary | ICD-10-CM

## 2016-08-20 DIAGNOSIS — Z6832 Body mass index (BMI) 32.0-32.9, adult: Secondary | ICD-10-CM | POA: Diagnosis not present

## 2016-08-20 DIAGNOSIS — E1142 Type 2 diabetes mellitus with diabetic polyneuropathy: Secondary | ICD-10-CM | POA: Insufficient documentation

## 2016-08-20 DIAGNOSIS — Z7982 Long term (current) use of aspirin: Secondary | ICD-10-CM | POA: Diagnosis not present

## 2016-08-20 DIAGNOSIS — K219 Gastro-esophageal reflux disease without esophagitis: Secondary | ICD-10-CM | POA: Insufficient documentation

## 2016-08-20 DIAGNOSIS — M5116 Intervertebral disc disorders with radiculopathy, lumbar region: Principal | ICD-10-CM | POA: Insufficient documentation

## 2016-08-20 DIAGNOSIS — D8689 Sarcoidosis of other sites: Secondary | ICD-10-CM | POA: Diagnosis not present

## 2016-08-20 DIAGNOSIS — Z79899 Other long term (current) drug therapy: Secondary | ICD-10-CM | POA: Diagnosis not present

## 2016-08-20 HISTORY — PX: LUMBAR LAMINECTOMY/DECOMPRESSION MICRODISCECTOMY: SHX5026

## 2016-08-20 HISTORY — DX: Other intervertebral disc displacement, lumbar region: M51.26

## 2016-08-20 LAB — GLUCOSE, CAPILLARY
GLUCOSE-CAPILLARY: 258 mg/dL — AB (ref 65–99)
Glucose-Capillary: 107 mg/dL — ABNORMAL HIGH (ref 65–99)
Glucose-Capillary: 140 mg/dL — ABNORMAL HIGH (ref 65–99)
Glucose-Capillary: 142 mg/dL — ABNORMAL HIGH (ref 65–99)

## 2016-08-20 SURGERY — LUMBAR LAMINECTOMY/DECOMPRESSION MICRODISCECTOMY 1 LEVEL
Anesthesia: General | Site: Back | Laterality: Left

## 2016-08-20 MED ORDER — LACTATED RINGERS IV SOLN: INTRAVENOUS | Status: DC

## 2016-08-20 MED ORDER — LIDOCAINE HCL (CARDIAC) 20 MG/ML IV SOLN
INTRAVENOUS | Status: DC | PRN
  Administered 2016-08-20: 60 mg via INTRAVENOUS

## 2016-08-20 MED ORDER — CHLORHEXIDINE GLUCONATE CLOTH 2 % EX PADS: 6.0000 | MEDICATED_PAD | Freq: Once | CUTANEOUS | Status: DC

## 2016-08-20 MED ORDER — MIDAZOLAM HCL 5 MG/5ML IJ SOLN
INTRAMUSCULAR | Status: DC | PRN
  Administered 2016-08-20: 2 mg via INTRAVENOUS

## 2016-08-20 MED ORDER — HYDROMORPHONE HCL 1 MG/ML IJ SOLN
0.2500 mg | INTRAMUSCULAR | Status: DC | PRN
  Administered 2016-08-20 (×3): 0.5 mg via INTRAVENOUS

## 2016-08-20 MED ORDER — SODIUM CHLORIDE 0.9 % IV SOLN: 250.0000 mL | INTRAVENOUS | Status: DC

## 2016-08-20 MED ORDER — ROCURONIUM BROMIDE 100 MG/10ML IV SOLN
INTRAVENOUS | Status: DC | PRN
  Administered 2016-08-20: 50 mg via INTRAVENOUS

## 2016-08-20 MED ORDER — INSULIN ASPART 100 UNIT/ML ~~LOC~~ SOLN
0.0000 [IU] | Freq: Three times a day (TID) | SUBCUTANEOUS | Status: DC
Start: 2016-08-21 — End: 2016-08-22
  Administered 2016-08-21 (×2): 3 [IU] via SUBCUTANEOUS
  Administered 2016-08-21 – 2016-08-22 (×2): 5 [IU] via SUBCUTANEOUS
  Administered 2016-08-22: 2 [IU] via SUBCUTANEOUS

## 2016-08-20 MED ORDER — ONDANSETRON HCL 4 MG/2ML IJ SOLN: 4.0000 mg | INTRAMUSCULAR | Status: DC | PRN

## 2016-08-20 MED ORDER — HYDROMORPHONE HCL 1 MG/ML IJ SOLN
INTRAMUSCULAR | Status: AC
  Filled 2016-08-20: qty 1

## 2016-08-20 MED ORDER — GABAPENTIN 300 MG PO CAPS: 300.0000 mg | ORAL_CAPSULE | Freq: Three times a day (TID) | ORAL | Status: DC

## 2016-08-20 MED ORDER — THROMBIN 5000 UNITS EX SOLR
CUTANEOUS | Status: DC | PRN
  Administered 2016-08-20 (×2): 5000 [IU] via TOPICAL

## 2016-08-20 MED ORDER — METFORMIN HCL 500 MG PO TABS
500.0000 mg | ORAL_TABLET | Freq: Two times a day (BID) | ORAL | Status: DC
  Administered 2016-08-20 – 2016-08-22 (×4): 500 mg via ORAL
  Filled 2016-08-20 (×4): qty 1

## 2016-08-20 MED ORDER — CEFAZOLIN SODIUM-DEXTROSE 2-3 GM-% IV SOLR
INTRAVENOUS | Status: DC | PRN
  Administered 2016-08-20: 2 g via INTRAVENOUS

## 2016-08-20 MED ORDER — CYCLOBENZAPRINE HCL 10 MG PO TABS
10.0000 mg | ORAL_TABLET | Freq: Three times a day (TID) | ORAL | Status: DC | PRN
  Administered 2016-08-20 – 2016-08-22 (×4): 10 mg via ORAL
  Filled 2016-08-20 (×4): qty 1

## 2016-08-20 MED ORDER — SUGAMMADEX SODIUM 200 MG/2ML IV SOLN
INTRAVENOUS | Status: DC | PRN
  Administered 2016-08-20: 200 mg via INTRAVENOUS

## 2016-08-20 MED ORDER — FENTANYL CITRATE (PF) 100 MCG/2ML IJ SOLN
INTRAMUSCULAR | Status: DC | PRN
  Administered 2016-08-20 (×2): 50 ug via INTRAVENOUS

## 2016-08-20 MED ORDER — MENTHOL 3 MG MT LOZG: 1.0000 | LOZENGE | OROMUCOSAL | Status: DC | PRN

## 2016-08-20 MED ORDER — ACETAMINOPHEN 325 MG PO TABS: 650.0000 mg | ORAL_TABLET | ORAL | Status: DC | PRN

## 2016-08-20 MED ORDER — HEMOSTATIC AGENTS (NO CHARGE) OPTIME
TOPICAL | Status: DC | PRN
  Administered 2016-08-20: 1 via TOPICAL

## 2016-08-20 MED ORDER — METHOTREXATE 2.5 MG PO TABS: 10.0000 mg | ORAL_TABLET | ORAL | Status: DC

## 2016-08-20 MED ORDER — ACETAMINOPHEN 650 MG RE SUPP: 650.0000 mg | RECTAL | Status: DC | PRN

## 2016-08-20 MED ORDER — SUCCINYLCHOLINE CHLORIDE 20 MG/ML IJ SOLN
INTRAMUSCULAR | Status: DC | PRN
  Administered 2016-08-20: 120 mg via INTRAVENOUS

## 2016-08-20 MED ORDER — ZOLPIDEM TARTRATE 5 MG PO TABS: 5.0000 mg | ORAL_TABLET | Freq: Every evening | ORAL | Status: DC | PRN

## 2016-08-20 MED ORDER — MEPERIDINE HCL 25 MG/ML IJ SOLN: 6.2500 mg | INTRAMUSCULAR | Status: DC | PRN

## 2016-08-20 MED ORDER — PHENYLEPHRINE HCL 10 MG/ML IJ SOLN
INTRAMUSCULAR | Status: DC | PRN
  Administered 2016-08-20: 40 ug via INTRAVENOUS

## 2016-08-20 MED ORDER — ASPIRIN 81 MG PO CHEW
81.0000 mg | CHEWABLE_TABLET | Freq: Every day | ORAL | Status: DC
  Administered 2016-08-20 – 2016-08-22 (×3): 81 mg via ORAL
  Filled 2016-08-20 (×3): qty 1

## 2016-08-20 MED ORDER — SODIUM CHLORIDE 0.9 % IV SOLN
INTRAVENOUS | Status: DC
  Administered 2016-08-20: 20:00:00 via INTRAVENOUS

## 2016-08-20 MED ORDER — PROPOFOL 10 MG/ML IV BOLUS: INTRAVENOUS | Status: DC | PRN

## 2016-08-20 MED ORDER — PROPOFOL 10 MG/ML IV BOLUS
INTRAVENOUS | Status: AC
  Filled 2016-08-20: qty 40

## 2016-08-20 MED ORDER — CEFAZOLIN IN D5W 1 GM/50ML IV SOLN
1.0000 g | Freq: Three times a day (TID) | INTRAVENOUS | Status: AC
  Administered 2016-08-20 – 2016-08-21 (×2): 1 g via INTRAVENOUS
  Filled 2016-08-20 (×2): qty 50

## 2016-08-20 MED ORDER — MORPHINE SULFATE (PF) 2 MG/ML IV SOLN
1.0000 mg | INTRAVENOUS | Status: DC | PRN
  Administered 2016-08-20 – 2016-08-22 (×6): 2 mg via INTRAVENOUS
  Filled 2016-08-20 (×6): qty 1

## 2016-08-20 MED ORDER — OXYCODONE-ACETAMINOPHEN 5-325 MG PO TABS
1.0000 | ORAL_TABLET | ORAL | Status: DC | PRN
Start: 2016-08-20 — End: 2016-08-22
  Administered 2016-08-20 – 2016-08-22 (×8): 2 via ORAL
  Filled 2016-08-20 (×8): qty 2

## 2016-08-20 MED ORDER — MIDAZOLAM HCL 2 MG/2ML IJ SOLN
INTRAMUSCULAR | Status: AC
  Filled 2016-08-20: qty 2

## 2016-08-20 MED ORDER — ARTIFICIAL TEARS OP OINT
TOPICAL_OINTMENT | OPHTHALMIC | Status: DC | PRN
  Administered 2016-08-20: 1 via OPHTHALMIC

## 2016-08-20 MED ORDER — LACTATED RINGERS IV SOLN
INTRAVENOUS | Status: DC | PRN
  Administered 2016-08-20: 08:00:00 via INTRAVENOUS

## 2016-08-20 MED ORDER — FENTANYL CITRATE (PF) 100 MCG/2ML IJ SOLN
INTRAMUSCULAR | Status: AC
  Filled 2016-08-20: qty 2

## 2016-08-20 MED ORDER — SCOPOLAMINE 1 MG/3DAYS TD PT72
MEDICATED_PATCH | TRANSDERMAL | Status: DC | PRN
  Administered 2016-08-20: 1 via TRANSDERMAL

## 2016-08-20 MED ORDER — 0.9 % SODIUM CHLORIDE (POUR BTL) OPTIME
TOPICAL | Status: DC | PRN
  Administered 2016-08-20: 1000 mL

## 2016-08-20 MED ORDER — METHYLPREDNISOLONE ACETATE 80 MG/ML IJ SUSP
INTRAMUSCULAR | Status: DC | PRN
  Administered 2016-08-20: 80 mg

## 2016-08-20 MED ORDER — BUPIVACAINE-EPINEPHRINE (PF) 0.5% -1:200000 IJ SOLN
INTRAMUSCULAR | Status: DC | PRN
  Administered 2016-08-20: 10 mL

## 2016-08-20 MED ORDER — SODIUM CHLORIDE 0.9% FLUSH: 3.0000 mL | INTRAVENOUS | Status: DC | PRN

## 2016-08-20 MED ORDER — SODIUM CHLORIDE 0.9% FLUSH
3.0000 mL | Freq: Two times a day (BID) | INTRAVENOUS | Status: DC
  Administered 2016-08-22: 3 mL via INTRAVENOUS

## 2016-08-20 MED ORDER — PROMETHAZINE HCL 25 MG/ML IJ SOLN: 6.2500 mg | INTRAMUSCULAR | Status: DC | PRN

## 2016-08-20 MED ORDER — ATORVASTATIN CALCIUM 20 MG PO TABS
20.0000 mg | ORAL_TABLET | Freq: Every evening | ORAL | Status: DC
  Administered 2016-08-20 – 2016-08-21 (×2): 20 mg via ORAL
  Filled 2016-08-20: qty 2
  Filled 2016-08-20: qty 1
  Filled 2016-08-20: qty 2
  Filled 2016-08-20 (×2): qty 1

## 2016-08-20 MED ORDER — PHENOL 1.4 % MT LIQD: 1.0000 | OROMUCOSAL | Status: DC | PRN

## 2016-08-20 MED ORDER — ONDANSETRON HCL 4 MG/2ML IJ SOLN
INTRAMUSCULAR | Status: DC | PRN
  Administered 2016-08-20: 4 mg via INTRAVENOUS

## 2016-08-20 MED ORDER — FENTANYL CITRATE (PF) 100 MCG/2ML IJ SOLN
INTRAMUSCULAR | Status: DC | PRN
  Administered 2016-08-20: 100 ug via INTRAVENOUS

## 2016-08-20 MED ORDER — LOSARTAN POTASSIUM 50 MG PO TABS
50.0000 mg | ORAL_TABLET | Freq: Every day | ORAL | Status: DC
  Administered 2016-08-20 – 2016-08-21 (×2): 50 mg via ORAL
  Filled 2016-08-20 (×2): qty 1

## 2016-08-20 MED ORDER — PROPOFOL 10 MG/ML IV BOLUS
INTRAVENOUS | Status: DC | PRN
  Administered 2016-08-20: 150 mg via INTRAVENOUS

## 2016-08-20 MED ORDER — SENNA 8.6 MG PO TABS
1.0000 | ORAL_TABLET | Freq: Two times a day (BID) | ORAL | Status: DC
  Administered 2016-08-20 – 2016-08-22 (×4): 8.6 mg via ORAL
  Filled 2016-08-20 (×4): qty 1

## 2016-08-20 SURGICAL SUPPLY — 56 items
BENZOIN TINCTURE PRP APPL 2/3 (GAUZE/BANDAGES/DRESSINGS) ×3 IMPLANT
BLADE CLIPPER SURG (BLADE) IMPLANT
BUR ACORN 6.0 (BURR) ×2 IMPLANT
BUR ACORN 6.0MM (BURR) ×1
BUR MATCHSTICK NEURO 3.0 LAGG (BURR) ×3 IMPLANT
CANISTER SUCT 3000ML PPV (MISCELLANEOUS) ×3 IMPLANT
CLOSURE WOUND 1/2 X4 (GAUZE/BANDAGES/DRESSINGS) ×1
DRAPE LAPAROTOMY 100X72X124 (DRAPES) ×3 IMPLANT
DRAPE MICROSCOPE LEICA (MISCELLANEOUS) ×3 IMPLANT
DRAPE POUCH INSTRU U-SHP 10X18 (DRAPES) ×3 IMPLANT
DRAPE PROXIMA HALF (DRAPES) ×6 IMPLANT
DRSG OPSITE POSTOP 3X4 (GAUZE/BANDAGES/DRESSINGS) ×3 IMPLANT
DRSG PAD ABDOMINAL 8X10 ST (GAUZE/BANDAGES/DRESSINGS) IMPLANT
DURAPREP 26ML APPLICATOR (WOUND CARE) ×3 IMPLANT
ELECT BLADE 4.0 EZ CLEAN MEGAD (MISCELLANEOUS) ×3
ELECT REM PT RETURN 9FT ADLT (ELECTROSURGICAL) ×3
ELECTRODE BLDE 4.0 EZ CLN MEGD (MISCELLANEOUS) ×1 IMPLANT
ELECTRODE REM PT RTRN 9FT ADLT (ELECTROSURGICAL) ×1 IMPLANT
GAUZE SPONGE 4X4 12PLY STRL (GAUZE/BANDAGES/DRESSINGS) IMPLANT
GAUZE SPONGE 4X4 16PLY XRAY LF (GAUZE/BANDAGES/DRESSINGS) IMPLANT
GLOVE BIO SURGEON STRL SZ8 (GLOVE) ×3 IMPLANT
GLOVE BIO SURGEON STRL SZ8.5 (GLOVE) ×3 IMPLANT
GLOVE BIOGEL M 8.0 STRL (GLOVE) ×3 IMPLANT
GLOVE BIOGEL PI IND STRL 7.0 (GLOVE) ×1 IMPLANT
GLOVE BIOGEL PI INDICATOR 7.0 (GLOVE) ×2
GLOVE EXAM NITRILE LRG STRL (GLOVE) IMPLANT
GLOVE EXAM NITRILE XL STR (GLOVE) IMPLANT
GLOVE EXAM NITRILE XS STR PU (GLOVE) IMPLANT
GLOVE SURG SS PI 6.5 STRL IVOR (GLOVE) ×9 IMPLANT
GOWN STRL REUS W/ TWL LRG LVL3 (GOWN DISPOSABLE) ×2 IMPLANT
GOWN STRL REUS W/ TWL XL LVL3 (GOWN DISPOSABLE) ×1 IMPLANT
GOWN STRL REUS W/TWL 2XL LVL3 (GOWN DISPOSABLE) IMPLANT
GOWN STRL REUS W/TWL LRG LVL3 (GOWN DISPOSABLE) ×4
GOWN STRL REUS W/TWL XL LVL3 (GOWN DISPOSABLE) ×2
KIT BASIN OR (CUSTOM PROCEDURE TRAY) ×3 IMPLANT
KIT ROOM TURNOVER OR (KITS) ×3 IMPLANT
NEEDLE HYPO 18GX1.5 BLUNT FILL (NEEDLE) ×3 IMPLANT
NEEDLE HYPO 21X1.5 SAFETY (NEEDLE) IMPLANT
NEEDLE HYPO 25X1 1.5 SAFETY (NEEDLE) IMPLANT
NEEDLE SPNL 20GX3.5 QUINCKE YW (NEEDLE) ×3 IMPLANT
NS IRRIG 1000ML POUR BTL (IV SOLUTION) ×3 IMPLANT
PACK LAMINECTOMY NEURO (CUSTOM PROCEDURE TRAY) ×3 IMPLANT
PAD ARMBOARD 7.5X6 YLW CONV (MISCELLANEOUS) ×9 IMPLANT
PATTIES SURGICAL .5 X1 (DISPOSABLE) IMPLANT
RUBBERBAND STERILE (MISCELLANEOUS) ×6 IMPLANT
SPONGE LAP 4X18 X RAY DECT (DISPOSABLE) IMPLANT
SPONGE SURGIFOAM ABS GEL SZ50 (HEMOSTASIS) ×3 IMPLANT
STRIP CLOSURE SKIN 1/2X4 (GAUZE/BANDAGES/DRESSINGS) ×2 IMPLANT
SUT VIC AB 0 CT1 18XCR BRD8 (SUTURE) ×1 IMPLANT
SUT VIC AB 0 CT1 8-18 (SUTURE) ×2
SUT VIC AB 2-0 CP2 18 (SUTURE) ×3 IMPLANT
SUT VIC AB 3-0 SH 8-18 (SUTURE) ×3 IMPLANT
SYR 5ML LL (SYRINGE) ×3 IMPLANT
TOWEL OR 17X24 6PK STRL BLUE (TOWEL DISPOSABLE) ×3 IMPLANT
TOWEL OR 17X26 10 PK STRL BLUE (TOWEL DISPOSABLE) ×3 IMPLANT
WATER STERILE IRR 1000ML POUR (IV SOLUTION) ×3 IMPLANT

## 2016-08-20 NOTE — Anesthesia Procedure Notes (Addendum)
Procedure Name: Intubation Date/Time: 08/20/2016 8:47 AM Performed by: Scheryl Darter Pre-anesthesia Checklist: Patient identified, Emergency Drugs available, Suction available and Patient being monitored Patient Re-evaluated:Patient Re-evaluated prior to inductionOxygen Delivery Method: Circle System Utilized Preoxygenation: Pre-oxygenation with 100% oxygen Intubation Type: IV induction Ventilation: Mask ventilation without difficulty Laryngoscope Size: 2 Grade View: Grade II Tube type: Oral Tube size: 7.0 mm Number of attempts: 1 Airway Equipment and Method: Stylet Placement Confirmation: ETT inserted through vocal cords under direct vision,  positive ETCO2 and breath sounds checked- equal and bilateral Secured at: 21 cm Tube secured with: Tape Dental Injury: Teeth and Oropharynx as per pre-operative assessment

## 2016-08-20 NOTE — Anesthesia Postprocedure Evaluation (Signed)
Anesthesia Post Note  Patient: [name] [name]  Procedure(s) Performed: Procedure(s) (LRB): Left Lumbar four-five Microdiskectomy (Left)  Patient location during evaluation: PACU Anesthesia Type: General Level of consciousness: awake and alert Pain management: pain level controlled Vital Signs Assessment: post-procedure vital signs reviewed and stable Respiratory status: spontaneous breathing, nonlabored ventilation, respiratory function stable and patient connected to nasal cannula oxygen Cardiovascular status: blood pressure returned to baseline and stable Postop Assessment: no signs of nausea or vomiting Anesthetic complications: no    Last Vitals:  Vitals:   08/20/16 1130 08/20/16 1200  BP: 122/71 125/72  Pulse: 75 77  Resp: 15 15  Temp:      Last Pain:  Vitals:   08/20/16 1200  PainSc: Asleep                 Effie Berkshire

## 2016-08-20 NOTE — Anesthesia Preprocedure Evaluation (Addendum)
Anesthesia Evaluation  Patient identified by MRN, date of birth, ID band Patient awake    Reviewed: Allergy & Precautions, NPO status , Patient's Chart, lab work & pertinent test results  Airway Mallampati: III  TM Distance: >3 FB   Mouth opening: Limited Mouth Opening  Dental  (+) Dental Advisory Given, Teeth Intact   Pulmonary sleep apnea ,    breath sounds clear to auscultation       Cardiovascular hypertension, Pt. on medications  Rhythm:Regular Rate:Normal     Neuro/Psych  Headaches, PSYCHIATRIC DISORDERS  Neuromuscular disease    GI/Hepatic Neg liver ROS, GERD  Medicated,  Endo/Other  diabetes, Type 2, Oral Hypoglycemic AgentsHypothyroidism   Renal/GU Renal disease  negative genitourinary   Musculoskeletal  (+) Arthritis , Osteoarthritis,    Abdominal Normal abdominal exam  (+)   Peds negative pediatric ROS (+)  Hematology   Anesthesia Other Findings   Reproductive/Obstetrics negative OB ROS                            Anesthesia Physical Anesthesia Plan  ASA: II  Anesthesia Plan: General   Post-op Pain Management:    Induction: Intravenous  Airway Management Planned: Oral ETT  Additional Equipment:   Intra-op Plan:   Post-operative Plan: Extubation in OR  Informed Consent: I have reviewed the patients History and Physical, chart, labs and discussed the procedure including the risks, benefits and alternatives for the proposed anesthesia with the patient or authorized representative who has indicated his/her understanding and acceptance.   Dental advisory given  Plan Discussed with: CRNA  Anesthesia Plan Comments: (Avoid Ondansetron)        Anesthesia Quick Evaluation

## 2016-08-20 NOTE — Transfer of Care (Signed)
Immediate Anesthesia Transfer of Care Note  Patient: [name] [name]  Procedure(s) Performed: Procedure(s): Left Lumbar four-five Microdiskectomy (Left)  Patient Location: PACU  Anesthesia Type:General  Level of Consciousness: awake, alert , oriented and sedated  Airway & Oxygen Therapy: Patient Spontanous Breathing and Patient connected to face mask oxygen  Post-op Assessment: Report given to RN, Post -op Vital signs reviewed and stable and Patient moving all extremities  Post vital signs: Reviewed and stable  Last Vitals:  Vitals:   08/20/16 0711 08/20/16 0720  BP: 135/80   Pulse: 86   Resp: 18   Temp:  36.9 C    Last Pain:  Vitals:   08/20/16 0706  PainSc: 5          Complications: No apparent anesthesia complications

## 2016-08-20 NOTE — Progress Notes (Signed)
Pt blood glucose 258, she states that she takes metformin 1000 mg BID.  MD called and new orders given to be carried out.

## 2016-08-21 ENCOUNTER — Encounter (HOSPITAL_COMMUNITY): Payer: Self-pay | Admitting: Neurosurgery

## 2016-08-21 DIAGNOSIS — M5116 Intervertebral disc disorders with radiculopathy, lumbar region: Secondary | ICD-10-CM | POA: Diagnosis not present

## 2016-08-21 LAB — GLUCOSE, CAPILLARY
GLUCOSE-CAPILLARY: 153 mg/dL — AB (ref 65–99)
GLUCOSE-CAPILLARY: 164 mg/dL — AB (ref 65–99)
GLUCOSE-CAPILLARY: 193 mg/dL — AB (ref 65–99)
Glucose-Capillary: 202 mg/dL — ABNORMAL HIGH (ref 65–99)

## 2016-08-21 NOTE — Evaluation (Signed)
Occupational Therapy Evaluation Patient Details Name: [name] [name] MRN: [id number] DOB: 06/30/1966 Today's Date: 08/21/2016    History of Present Illness s/p Left L4-5 laminotomy, diskectomy, decompression of the L4.  PMH includes DM, thyroid cancer, chronic fatigue, anemia, sarcoidosis.   Clinical Impression   Pt admitted with the above diagnoses and presents with below problem list. Pt will benefit from continued acute OT to address the below listed deficits and maximize independence with basic ADLs prior to d/c home. PTA pt was independent with ADLs. Pt currently min guard to min A with LB ADLs. Session details below.       Follow Up Recommendations  Supervision/Assistance - 24 hour;No OT follow up    Equipment Recommendations  3 in 1 bedside comode    Recommendations for Other Services       Precautions / Restrictions Precautions Precautions: Back Precaution Booklet Issued: Yes (comment) Precaution Comments: handout in room, reviewed precautions Required Braces or Orthoses:  (no brace needed per order) Restrictions Weight Bearing Restrictions: No      Mobility Bed Mobility Overal bed mobility: Needs Assistance Bed Mobility: Rolling;Sit to Sidelying Rolling: Min guard       Sit to sidelying: Min guard General bed mobility comments: min guard to maintain back precautions. Cues for technique.   Transfers Overall transfer level: Needs assistance Equipment used: Rolling walker (2 wheeled) Transfers: Sit to/from Stand Sit to Stand: Min guard         General transfer comment: from recliner, 3n1, and elevated EOB. Cues for hand placement and technique with rw.    Balance Overall balance assessment: Needs assistance Sitting-balance support: No upper extremity supported;Feet supported Sitting balance-Leahy Scale: Good     Standing balance support: Bilateral upper extremity supported;During functional activity Standing balance-Leahy Scale: Fair                               ADL Overall ADL's : Needs assistance/impaired Eating/Feeding: Set up;Sitting   Grooming: Min guard;Wash/dry hands;Standing   Upper Body Bathing: Set up;Sitting   Lower Body Bathing: Min guard;Minimal assistance;Sit to/from stand   Upper Body Dressing : Set up;Sitting   Lower Body Dressing: Min guard;Minimal assistance;Sit to/from stand   Toilet Transfer: Min guard;Ambulation;RW (3n1 over toilet)   Toileting- Clothing Manipulation and Hygiene: Min guard;Minimal assistance;Sit to/from stand Toileting - Clothing Manipulation Details (indicate cue type and reason): discussed AE for posterior pericare Tub/ Shower Transfer: Tub transfer;Minimal assistance;Ambulation;3 in 1;Rolling walker   Functional mobility during ADLs: Min guard;Rolling walker General ADL Comments: Pt up in chair upon therapist arrival. Completed toilet transfer, washed hands at sink and bed mobility as detailed above. Pt moves slowly and cautiously. ADL education provided including strategies and AE/DME.     Vision     Perception     Praxis      Pertinent Vitals/Pain Pain Assessment: 0-10 Pain Score: 7  Pain Descriptors / Indicators: Aching;Grimacing;Sore Pain Intervention(s): Limited activity within patient's tolerance;Monitored during session;Repositioned;Patient requesting pain meds-RN notified;Other (comment) (Nurse in room upon therapist arrival)     Hand Dominance     Extremity/Trunk Assessment Upper Extremity Assessment Upper Extremity Assessment: Overall WFL for tasks assessed   Lower Extremity Assessment Lower Extremity Assessment: Defer to PT evaluation       Communication Communication Communication: No difficulties   Cognition Arousal/Alertness: Awake/alert Behavior During Therapy: WFL for tasks assessed/performed Overall Cognitive Status: Within Functional Limits for tasks assessed  General Comments       Exercises        Shoulder Instructions      Home Living Family/patient expects to be discharged to:: Private residence Living Arrangements: Spouse/significant other Available Help at Discharge: Family               Bathroom Shower/Tub: Teacher, early years/pre: Standard     Home Equipment: None          Prior Functioning/Environment Level of Independence: Independent                 OT Problem List: Impaired balance (sitting and/or standing);Decreased knowledge of use of DME or AE;Decreased knowledge of precautions;Pain   OT Treatment/Interventions: Self-care/ADL training;DME and/or AE instruction;Therapeutic activities;Patient/family education;Balance training    OT Goals(Current goals can be found in the care plan section) Acute Rehab OT Goals Patient Stated Goal: not stated OT Goal Formulation: With patient Time For Goal Achievement: 08/28/16 Potential to Achieve Goals: Good ADL Goals Pt Will Perform Lower Body Bathing: with modified independence;with adaptive equipment;sit to/from stand Pt Will Perform Lower Body Dressing: with modified independence;with adaptive equipment;sit to/from stand Pt Will Transfer to Toilet: with modified independence;ambulating (3n1 over toilet) Pt Will Perform Toileting - Clothing Manipulation and hygiene: with modified independence;sitting/lateral leans;sit to/from stand;with adaptive equipment Pt Will Perform Tub/Shower Transfer: Tub transfer;with supervision;ambulating;3 in 1;rolling walker  OT Frequency: Min 2X/week   Barriers to D/C:            Co-evaluation              End of Session Equipment Utilized During Treatment: Gait belt;Rolling walker  Activity Tolerance: Patient limited by pain;Patient tolerated treatment well Patient left: in bed;with call bell/phone within reach;with SCD's reapplied   Time: 1135-1153 OT Time Calculation (min): 18 min Charges:  OT General Charges $OT Visit: 1 Procedure OT  Evaluation $OT Eval Low Complexity: 1 Procedure G-Codes: OT G-codes **NOT FOR INPATIENT CLASS** Functional Assessment Tool Used: clincial judgement Functional Limitation: Self care Self Care Current Status [id number]): At least 1 percent but less than 20 percent impaired, limited or restricted Self Care Goal Status [id number]): At least 1 percent but less than 20 percent impaired, limited or restricted  Hortencia Pilar 08/21/2016, 12:14 PM

## 2016-08-21 NOTE — Evaluation (Signed)
Physical Therapy Evaluation Patient Details Name: [name] [name] MRN: [id number] DOB: 09-07-1966 Today's Date: 08/21/2016   History of Present Illness  s/p Left L4-5 laminotomy, diskectomy, decompression of the L4.  PMH includes DM, thyroid cancer, chronic fatigue, anemia, sarcoidosis.  Clinical Impression  Patient presents with decreased mobility due to pain and deficits listed in PT problem list.  She will benefit from skilled PT in the acute setting to allow return home with family support and follow up HHPT.     Follow Up Recommendations Home health PT;Supervision - Intermittent    Equipment Recommendations  Rolling walker with 5" wheels    Recommendations for Other Services       Precautions / Restrictions Precautions Precautions: Back Precaution Booklet Issued: Yes (comment) Precaution Comments: reviewed precautions on handout Required Braces or Orthoses:  (no brace needed per order) Restrictions Weight Bearing Restrictions: No      Mobility  Bed Mobility Overal bed mobility: Needs Assistance Bed Mobility: Rolling;Sidelying to Sit Rolling: Min assist       Sit to sidelying: Min guard General bed mobility comments: increased time, cues for technique, assist for lifting trunk  Transfers Overall transfer level: Needs assistance Equipment used: Rolling walker (2 wheeled) Transfers: Sit to/from Stand Sit to Stand: Min guard         General transfer comment: increased time, up from bed, then toilet; cuse for safety stand to sit  Ambulation/Gait Ambulation/Gait assistance: Min guard Ambulation Distance (Feet): 130 Feet Assistive device: Rolling walker (2 wheeled) Gait Pattern/deviations: Step-through pattern;Decreased stride length;Antalgic;Wide base of support;Trunk flexed     General Gait Details:  very slow and antalgic, cues for posture and encouragement for distance  Stairs            Wheelchair Mobility    Modified Rankin (Stroke Patients  Only)       Balance Overall balance assessment: Needs assistance Sitting-balance support: No upper extremity supported;Feet supported Sitting balance-Leahy Scale: Good     Standing balance support: Bilateral upper extremity supported;During functional activity Standing balance-Leahy Scale: Fair Standing balance comment: static standing without UE support, but uses arms heavily to ambulate                             Pertinent Vitals/Pain Pain Assessment: 0-10 Pain Score: 6  Pain Descriptors / Indicators: Discomfort;Operative site guarding;Grimacing Pain Intervention(s): Monitored during session;Limited activity within patient's tolerance;Repositioned    Home Living Family/patient expects to be discharged to:: Private residence Living Arrangements: Spouse/significant other Available Help at Discharge: Family Type of Home: House Home Access: Stairs to enter Entrance Stairs-Rails: None Technical brewer of Steps: 1 Home Layout: One level Home Equipment: None      Prior Function Level of Independence: Independent         Comments: reports not working due to brain tumor     Hand Dominance        Extremity/Trunk Assessment   Upper Extremity Assessment: Defer to OT evaluation           Lower Extremity Assessment: LLE deficits/detail   LLE Deficits / Details: AROM WFL, but more painful than R, at least 3/5 strength hip flexion, 4/5 knee extension     Communication   Communication: No difficulties  Cognition Arousal/Alertness: Awake/alert Behavior During Therapy: WFL for tasks assessed/performed Overall Cognitive Status: Within Functional Limits for tasks assessed  General Comments      Exercises     Assessment/Plan    PT Assessment Patient needs continued PT services  PT Problem List            PT Treatment Interventions DME instruction;Gait training;Balance training;Functional mobility  training;Therapeutic exercise;Therapeutic activities;Patient/family education    PT Goals (Current goals can be found in the Care Plan section)  Acute Rehab PT Goals Patient Stated Goal: To go home PT Goal Formulation: With patient Time For Goal Achievement: 08/27/16 Potential to Achieve Goals: Good    Frequency Min 5X/week   Barriers to discharge        Co-evaluation               End of Session Equipment Utilized During Treatment: Gait belt Activity Tolerance: Patient limited by pain;Patient limited by fatigue Patient left: in chair;with call bell/phone within reach      Functional Assessment Tool Used: Clinical Judgement Functional Limitation: Mobility: Walking and moving around Mobility: Walking and Moving Around Current Status [phone]): At least 20 percent but less than 40 percent impaired, limited or restricted Mobility: Walking and Moving Around Goal Status [phone]): At least 1 percent but less than 20 percent impaired, limited or restricted    Time: [id number] PT Time Calculation (min) (ACUTE ONLY): 26 min   Charges:   PT Evaluation $PT Eval Moderate Complexity: 1 Procedure PT Treatments $Gait Training: 8-22 mins   PT G Codes:   PT G-Codes **NOT FOR INPATIENT CLASS** Functional Assessment Tool Used: Clinical Judgement Functional Limitation: Mobility: Walking and moving around Mobility: Walking and Moving Around Current Status [id number]): At least 20 percent but less than 40 percent impaired, limited or restricted Mobility: Walking and Moving Around Goal Status [phone]): At least 1 percent but less than 20 percent impaired, limited or restricted    Reginia Naas 08/21/2016, 1:08 PM  Magda Kiel, [place] 08/21/2016

## 2016-08-21 NOTE — Op Note (Signed)
NAMEKENDRIANA, [name] [name]              ACCOUNT NO.:  [id number]  MEDICAL RECORD NO.:  [id number]  LOCATION:  5C12C                        FACILITY:  [facility]  PHYSICIAN:  Leeroy Cha, M.D.   DATE OF BIRTH:  March 03, 1966  DATE OF PROCEDURE:  08/20/2016 DATE OF DISCHARGE:                              OPERATIVE REPORT   PREOPERATIVE DIAGNOSES:  Left L4-5 herniated disk, foraminal.  Chronic left L5 radiculopathy.  POSTOPERATIVE DIAGNOSES:  Left L4-5 herniated disk, foraminal.  Chronic left L5 radiculopathy.  PROCEDURES:  Left L4-5 laminotomy, diskectomy, decompression of the L4 and L5 nerve root.  Microscope.  SURGEON:  Leeroy Cha, M.D.  ASSISTANT:  Ophelia Charter, M.D.  CLINICAL HISTORY:  [name] is a 50 year old female complaining of back pain radiation to the left leg for at least 2 years.  She is not any better with conservative treatment.  MRI showed that she has a foraminal disk.  The patient declined conservative treatment because she was not getting better.  We agree about surgery and she knew the risks and benefits.  DESCRIPTION OF PROCEDURE:  The patient was taken to the OR, and after intubation, she was positioned in a prone manner.  The back was cleaned with DuraPrep.  Drapes were applied.  Because of her size, I was unable to feel any bone structure.  We took an x-ray at the beginning to localize the area between 4-5.  Midline incision was made in the skin through the skin, subcutaneous tissue through a thick adipose tissue. Muscle and fascia were retracted laterally.  X-rays were obtained just to be sure that we were in the right area.  Then to visualize better, we used the microscope and with the drill, we removed the lower lamina of L4 and the upper of L5 with a thick calcified ligament.  The thecal sac was found.  The patient had quite a bit of adhesion.  Lysis was accomplished.  We retracted the thecal sac and we entered the disk space with removal of  degenerative disk disease mostly going laterally the medial.  At the end, we have a good decompression with plenty of space for the thecal sac, L4 and L5 nerve roots.  Foraminotomy was accomplished.  At the end, Valsalva maneuver was negative.  The area was irrigated.  Fentanyl and Depo-Medrol were left in the operative site and the wound was closed with Vicryl and Steri-Strip.          ______________________________ Leeroy Cha, M.D.     EB/MEDQ  D:  08/20/2016  T:  08/21/2016  Job:  [id number]

## 2016-08-21 NOTE — Progress Notes (Signed)
Internal Medicine Clinic Attending  Case discussed with Dr. Rice at the time of the visit.  We reviewed the resident's history and exam and pertinent patient test results.  I agree with the assessment, diagnosis, and plan of care documented in the resident's note.  

## 2016-08-21 NOTE — Progress Notes (Signed)
Patient ID: [name] [name], female   DOB: 1966-06-19, 50 y.o.   MRN: [id number] C/o incisional pin, no weakness no leg pain as preop

## 2016-08-22 DIAGNOSIS — M5116 Intervertebral disc disorders with radiculopathy, lumbar region: Secondary | ICD-10-CM | POA: Diagnosis not present

## 2016-08-22 LAB — GLUCOSE, CAPILLARY
GLUCOSE-CAPILLARY: 126 mg/dL — AB (ref 65–99)
Glucose-Capillary: 211 mg/dL — ABNORMAL HIGH (ref 65–99)

## 2016-08-22 NOTE — Progress Notes (Signed)
Physical Therapy Treatment Patient Details Name: [name] [name] MRN: [id number] DOB: 27-Jun-1966 Today's Date: 08/22/2016    History of Present Illness s/p Left L4-5 laminotomy, diskectomy, decompression of the L4.  PMH includes DM, thyroid cancer, chronic fatigue, anemia, sarcoidosis.    PT Comments    Pt presented sitting OOB in recliner when PT entered room. Pt stated that she is d/c'ing home today. PT verbally reviewed spinal precautions with pt. PT also adjusted pt's RW to an appropriate height for her to d/c home with. Pt continues to move very slowly with functional mobility secondary to pain. Pt would continue to benefit from skilled physical therapy services at this time while admitted and after d/c to address her limitations in order to improve her overall safety and independence with functional mobility.   Follow Up Recommendations  Home health PT;Supervision - Intermittent     Equipment Recommendations  Rolling walker with 5" wheels    Recommendations for Other Services       Precautions / Restrictions Precautions Precautions: Back Precaution Comments: reviewed spinal precautions with pt Restrictions Weight Bearing Restrictions: No    Mobility  Bed Mobility               General bed mobility comments: pt was sitting OOB in recliner when PT entered room  Transfers Overall transfer level: Needs assistance Equipment used: Rolling walker (2 wheeled) Transfers: Sit to/from Stand Sit to Stand: Min guard         General transfer comment: pt required increased time to complete  Ambulation/Gait Ambulation/Gait assistance: Min guard Ambulation Distance (Feet): 100 Feet Assistive device: Rolling walker (2 wheeled) Gait Pattern/deviations: Step-through pattern;Decreased step length - right;Decreased step length - left;Decreased stride length Gait velocity: decreased Gait velocity interpretation: Below normal speed for age/gender General Gait Details: pt  moving very slowly and grimacing frequently throughout   Stairs            Wheelchair Mobility    Modified Rankin (Stroke Patients Only)       Balance Overall balance assessment: Needs assistance Sitting-balance support: Feet supported;No upper extremity supported Sitting balance-Leahy Scale: Fair     Standing balance support: During functional activity;Bilateral upper extremity supported Standing balance-Leahy Scale: Poor                      Cognition Arousal/Alertness: Awake/alert Behavior During Therapy: WFL for tasks assessed/performed Overall Cognitive Status: Within Functional Limits for tasks assessed                      Exercises      General Comments        Pertinent Vitals/Pain Pain Assessment: Faces Pain Score: 6  Faces Pain Scale: Hurts even more Pain Location: back Pain Descriptors / Indicators: Grimacing;Guarding Pain Intervention(s): Monitored during session;Repositioned    Home Living                      Prior Function            PT Goals (current goals can now be found in the care plan section) Acute Rehab PT Goals Patient Stated Goal: to go home today PT Goal Formulation: With patient Time For Goal Achievement: 08/27/16 Potential to Achieve Goals: Good Progress towards PT goals: Progressing toward goals    Frequency    Min 5X/week      PT Plan Current plan remains appropriate    Co-evaluation  End of Session Equipment Utilized During Treatment: Gait belt Activity Tolerance: Patient limited by pain;Patient limited by fatigue Patient left: in chair;with call bell/phone within reach     Time: 1458-1510 PT Time Calculation (min) (ACUTE ONLY): 12 min  Charges:  $Gait Training: 8-22 mins                    G Codes:      Clearnce Sorrel Abbee Cremeens 2016/09/15, 3:15 PM Sherie Don, [place], DPT [phone]

## 2016-08-22 NOTE — Progress Notes (Signed)
Occupational Therapy Treatment Patient Details Name: [name] [name] MRN: [id number] DOB: October 16, 1966 Today's Date: 08/22/2016    History of present illness s/p Left L4-5 laminotomy, diskectomy, decompression of the L4.  PMH includes DM, thyroid cancer, chronic fatigue, anemia, sarcoidosis.   OT comments  Pt. Was able to perform self care tasks with education and use of AE. Pt. Was able to return demo LE dressing and bathing task. Pt. Was ed on toilet transfer and task using toilet tongs. Pt. Was able to simulate tub transfer at [facility] level.   Follow Up Recommendations       Equipment Recommendations       Recommendations for Other Services      Precautions / Restrictions Precautions Precautions: Back       Mobility Bed Mobility                  Transfers                      Balance                                   ADL               Lower Body Bathing: Supervison/ safety       Lower Body Dressing: Supervision/safety   Toilet Transfer: Min guard   Toileting- Clothing Manipulation and Hygiene: Supervision/safety   Tub/ Shower Transfer: Tub transfer   Functional mobility during ADLs:  (Pt. AMB CGA with walker.) General ADL Comments: Pt. was ed on use of AE for LE ADLs.       Vision                     Perception     Praxis      Cognition   Behavior During Therapy: WFL for tasks assessed/performed Overall Cognitive Status: Within Functional Limits for tasks assessed                       Extremity/Trunk Assessment               Exercises     Shoulder Instructions       General Comments      Pertinent Vitals/ Pain       Pain Assessment: 0-10 Pain Score: 6   Home Living                                          Prior Functioning/Environment              Frequency  Min 2X/week        Progress Toward Goals  OT Goals(current goals can now be found in the  care plan section)  Progress towards OT goals: Progressing toward goals  Acute Rehab OT Goals Patient Stated Goal: go home  Plan Discharge plan remains appropriate    Co-evaluation                 End of Session Equipment Utilized During Treatment: Gait belt;Rolling walker   Activity Tolerance Patient tolerated treatment well   Patient Left in chair;with call bell/phone within reach   Nurse Communication          Time: [id number] OT Time Calculation (min): 48 min  Charges:  [name] [name] 08/22/2016, 12:39 PM

## 2016-08-22 NOTE — Discharge Summary (Signed)
Physician Discharge Summary  Patient ID: AKAISHA HENCKEL MRN: [id number] DOB/AGE: 49-15-1967 49 y.o.  Admit date: 08/20/2016 Discharge date: 08/22/2016  Admission Diagnoses:l4-5 right lumbar herniated disc  Discharge Diagnoses:  Active Problems:   Herniated lumbar disc without myelopathy   Discharged Condition: incisional pain no weakness  Hospital Course:surgery  Consults: none  Significant Diagnostic Studies: mri  Treatments: lumbar discectomy l4-5  Discharge Exam: Blood pressure 132/77, pulse 95, temperature 97.8 F (36.6 C), temperature source Oral, resp. rate 19, height 5\' 8"  (1.727 m), weight 97.1 kg (214 lb 1.6 oz), last menstrual period 12/03/2011, SpO2 99 %. Wound dry. No weakness  Disposition: 01-Home or Self Care      Signed: Floyce Stakes 08/22/2016, 11:33 AM

## 2016-08-22 NOTE — Care Management Note (Signed)
Case Management Note  Patient Details  Name: [name] [name] MRN: [id number] Date of Birth: Apr 07, 1966  Subjective/Objective:                    Action/Plan: Pt discharging home with her spouse. Orders for rolling walker. Melissa with [facility] DME notified and will deliver the equipment to the patient's room. PT recommending HH PT. Pt's medicaid will not cover this service for the surgery the patient had. CM also spoke to Dr Joya Salm and he did not feel the patient needed [facility] PT. Will update the bedside RN.   Expected Discharge Date:  08/22/16               Expected Discharge Plan:  [place]  In-House Referral:     Discharge planning Services  CM Consult  Post Acute Care Choice:  Durable Medical Equipment Choice offered to:  Patient  DME Arranged:  Walker rolling DME Agency:  [place]:    [place]:     Status of Service:  Completed, signed off  If discussed at [place] of Stay Meetings, dates discussed:    Additional Comments:  Pollie Friar, RN 08/22/2016, 12:37 PM

## 2016-08-22 NOTE — Care Management Note (Signed)
Case Management Note  Patient Details  Name: THESSALY KARIS MRN: [id number] Date of Birth: 1966/05/26  Subjective/Objective:    Pt underwent: Left Lumbar four-five Microdiskectomy. She is from home with her spouse.                 Action/Plan: PT recommending [facility] services. No f/u per OT. CM following for d/c needs.   Expected Discharge Date:  08/22/16               Expected Discharge Plan:  [place]  In-House Referral:     Discharge planning Services     Post Acute Care Choice:    Choice offered to:     DME Arranged:    DME Agency:     HH Arranged:    [place] Agency:     Status of Service:  In process, will continue to follow  If discussed at Long Length of Stay Meetings, dates discussed:    Additional Comments:  Pollie Friar, RN 08/22/2016, 10:48 AM

## 2016-08-22 NOTE — Progress Notes (Signed)
Pt being discharged home via wheelchair with family. Pt alert and oriented x4. VSS. Pt c/o no pain at this time. No signs of respiratory distress. Education complete and care plans resolved. IV removed with catheter intact and pt tolerated well. No further issues at this time. Pt to follow up with PCP. Nazair Fortenberry R, RN 

## 2016-08-25 ENCOUNTER — Other Ambulatory Visit: Payer: Self-pay | Admitting: Internal Medicine

## 2016-09-11 ENCOUNTER — Other Ambulatory Visit: Payer: Self-pay | Admitting: Internal Medicine

## 2016-09-17 ENCOUNTER — Telehealth: Payer: Self-pay | Admitting: *Deleted

## 2016-09-17 NOTE — Telephone Encounter (Signed)
Call to [facility] [employer] for Losartan Potassium 50 mg tablets obtained. Approved 09/17/2016 thru 09/12/2017.  PA # [id number].  [facility] was called and informed of.  Sander Nephew, RN 09/17/2016 10:10 AM.

## 2016-09-30 ENCOUNTER — Other Ambulatory Visit: Payer: Self-pay | Admitting: Internal Medicine

## 2016-10-14 ENCOUNTER — Other Ambulatory Visit: Payer: Self-pay | Admitting: Internal Medicine

## 2016-11-14 ENCOUNTER — Other Ambulatory Visit: Payer: Self-pay | Admitting: Neurology

## 2016-11-18 ENCOUNTER — Other Ambulatory Visit: Payer: Self-pay | Admitting: Internal Medicine

## 2016-11-20 ENCOUNTER — Telehealth: Payer: Self-pay | Admitting: Internal Medicine

## 2016-11-20 NOTE — Telephone Encounter (Signed)
APT. REMINDER CALL, LMTCB °

## 2016-11-21 ENCOUNTER — Encounter: Payer: Self-pay | Admitting: Internal Medicine

## 2016-11-21 ENCOUNTER — Ambulatory Visit (INDEPENDENT_AMBULATORY_CARE_PROVIDER_SITE_OTHER): Payer: Medicaid Other | Admitting: Internal Medicine

## 2016-11-21 ENCOUNTER — Encounter (INDEPENDENT_AMBULATORY_CARE_PROVIDER_SITE_OTHER): Payer: Self-pay

## 2016-11-21 VITALS — BP 138/78 | HR 103 | Temp 97.8°F | Ht 68.0 in | Wt 219.8 lb

## 2016-11-21 DIAGNOSIS — E6609 Other obesity due to excess calories: Secondary | ICD-10-CM | POA: Diagnosis not present

## 2016-11-21 DIAGNOSIS — Z6833 Body mass index (BMI) 33.0-33.9, adult: Secondary | ICD-10-CM | POA: Diagnosis not present

## 2016-11-21 DIAGNOSIS — E119 Type 2 diabetes mellitus without complications: Secondary | ICD-10-CM

## 2016-11-21 DIAGNOSIS — Z7984 Long term (current) use of oral hypoglycemic drugs: Secondary | ICD-10-CM

## 2016-11-21 DIAGNOSIS — E118 Type 2 diabetes mellitus with unspecified complications: Secondary | ICD-10-CM

## 2016-11-21 LAB — POCT GLYCOSYLATED HEMOGLOBIN (HGB A1C): HEMOGLOBIN A1C: 7.9

## 2016-11-21 LAB — GLUCOSE, CAPILLARY: GLUCOSE-CAPILLARY: 111 mg/dL — AB (ref 65–99)

## 2016-11-21 MED ORDER — LIRAGLUTIDE 18 MG/3ML ~~LOC~~ SOPN: 1.2000 mg | PEN_INJECTOR | Freq: Every day | SUBCUTANEOUS | 2 refills | Status: DC

## 2016-11-21 MED ORDER — EXENATIDE 5 MCG/0.02ML ~~LOC~~ SOPN: 5.0000 ug | PEN_INJECTOR | Freq: Two times a day (BID) | SUBCUTANEOUS | 2 refills | Status: DC

## 2016-11-21 MED ORDER — PEN NEEDLES 32G X 4 MM MISC: 30.0000 | Freq: Every day | 2 refills | Status: DC

## 2016-11-21 NOTE — Progress Notes (Signed)
   CC: Follow up for diabetes  HPI:  [name] [name] is a 50 y.o. woman with medical history detailed below who is feeling well today being seen for follow up of her type 2 diabetes. She recently had lumbar spinal surgery for her chronic hip and leg pain that is now improving. She is back to being able to tolerate going to the gym with light exercise. 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 She is however frustrated with inability to lose weight despite increasing exercise. Her diet is fairly high in whole fruits and has seen Butch Penny in clinic twice before for recommendations.  See problem based assessment and plan below for additional details.  Past Medical History:  Diagnosis Date  . Anemia   . Arthritis    lumbar region- radiculopathy  . Cancer [facility]) 2013    Thyroid Ca-thyroidectomy  . Chronic fatigue   . DM (diabetes mellitus) ([place])   . Gait disturbance   . GERD (gastroesophageal reflux disease)   . Headache(784.0)   . HTN (hypertension)   . Hypothyroidism   . Nephrolithiasis   . Obesity   . OSA (obstructive sleep apnea)    last test- many yrs. ago, done at [facility], no CPAP in use  . Osteoporosis   . Peripheral neuropathy ([place])   . Prolonged QT interval   . 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  Eyes: Negative for blurred vision.  Respiratory: Negative for shortness of breath.   Cardiovascular: Negative for chest pain and leg swelling.  Gastrointestinal: Negative for diarrhea.  Musculoskeletal: Positive for back pain. Negative for falls.  Skin: Negative for rash.  Neurological: Positive for headaches. Negative for tremors and focal weakness.  Endo/Heme/Allergies: Positive for environmental allergies.  Psychiatric/Behavioral: Normal mood  and affect   Physical Exam:  Vitals:   11/21/16 1322  BP: 138/78  Pulse: (!) 103  Temp: 97.8 F (36.6 C)  TempSrc: Oral  SpO2: 100%  Weight: 219 lb 12.8 oz (99.7 kg)  Height: 5\' 8"  (1.727 m)   GENERAL- alert, co-operative, NAD HEENT- Wearing wig, no cervical LN enlargement CARDIAC- RRR, no murmurs, rubs or gallops. RESP- CTAB, no wheezes or crackles. BACK- Well healing incision over lumbar spine NEURO- Strength upper and lower extremities- 5/5, Sensation intact globally EXTREMITIES- pulse 2+, symmetric, no pedal edema. SKIN- No rashes, lesions, ulcers PSYCH- Normal mood and affect, appropriate thought content and speech.  Assessment & Plan:   See Encounters Tab for problem based charting.  Patient discussed with Dr. Evette Doffing

## 2016-11-21 NOTE — Patient Instructions (Signed)
It was a pleasure to see you today.  I have sent a prescription for Victoza (liraglutide) to your pharmacy. This medicine is a treatment for type 2 diabetes that also causes modest weight loss. You should take this starting at 0.6mg  (0.81mL) for one week then increase to 1.2mg  (0.51mL) daily. Let us know if you develop bad nausea, diarrhea, or rash around an injection site.  You will still need to work on physical activity and most importantly diet for sustained weight loss. Please call us back if you have any difficulties with obtaining the medicine or taking it.

## 2016-11-24 DIAGNOSIS — E669 Obesity, unspecified: Secondary | ICD-10-CM | POA: Insufficient documentation

## 2016-11-24 NOTE — Assessment & Plan Note (Signed)
Lab Results  Component Value Date   HGBA1C 7.9 11/21/2016   HGBA1C 6.7 (H) 08/13/2016   HGBA1C 7.8 02/01/2016     Assessment: Diabetes control: Above goal Progress toward A1C goal:  Worsened Comments: Her blood sugars have bene higher lately that is multifactorial with weight gain she attributes to decreased physical activity and stress with her pain now s/p surgery. She also admits to eating concentrated sweets occasionally but otherwise appropriate with large amounts of whole fruits (she reports this helps with constipation). She continues to take metformin twice daily as prescribed without problems.  Plan: Medications:  Continued metformin 1000 mg BID. We will try adding exenatide BID as a second agent for her diabetes control since this medicine should help or not worsen her weight gain. Instruction/counseling given: Isntructions given to watch closely for s/s of hypoglycemia. Or let us know if she does not tolerate starting the injectable medicine due to GI problems.  Other plans: Plan to repeat labs and general exam/foot exam in next visit. Can titrate up exenatide if tolerating and having a benefit.

## 2016-11-26 NOTE — Progress Notes (Signed)
Internal Medicine Clinic Attending  Case discussed with Dr. Rice at the time of the visit.  We reviewed the resident's history and exam and pertinent patient test results.  I agree with the assessment, diagnosis, and plan of care documented in the resident's note.  

## 2016-12-11 ENCOUNTER — Other Ambulatory Visit: Payer: Self-pay | Admitting: Internal Medicine

## 2016-12-19 ENCOUNTER — Other Ambulatory Visit: Payer: Self-pay | Admitting: Internal Medicine

## 2016-12-19 ENCOUNTER — Other Ambulatory Visit: Payer: Self-pay | Admitting: Neurology

## 2016-12-22 ENCOUNTER — Encounter: Payer: Self-pay | Admitting: Neurology

## 2016-12-22 ENCOUNTER — Telehealth: Payer: Self-pay

## 2016-12-22 ENCOUNTER — Ambulatory Visit (INDEPENDENT_AMBULATORY_CARE_PROVIDER_SITE_OTHER): Payer: Medicaid Other | Admitting: Neurology

## 2016-12-22 VITALS — BP 131/83 | HR 83 | Ht 68.0 in | Wt 213.0 lb

## 2016-12-22 DIAGNOSIS — Z5181 Encounter for therapeutic drug level monitoring: Secondary | ICD-10-CM | POA: Diagnosis not present

## 2016-12-22 DIAGNOSIS — D8689 Sarcoidosis of other sites: Secondary | ICD-10-CM | POA: Diagnosis not present

## 2016-12-22 DIAGNOSIS — G43019 Migraine without aura, intractable, without status migrainosus: Secondary | ICD-10-CM | POA: Diagnosis not present

## 2016-12-22 MED ORDER — HYDROCODONE-ACETAMINOPHEN 10-325 MG PO TABS: ORAL_TABLET | ORAL | 0 refills | Status: DC

## 2016-12-22 NOTE — Telephone Encounter (Signed)
Pt no-showed her follow-up appt this morning.

## 2016-12-22 NOTE — Progress Notes (Signed)
Reason for visit: Sarcoidosis  [name] [name] is an 51 y.o. female  History of present illness:  [name] is a 51 year old right-handed black female with a history of intractable headache and a history of neurosarcoidosis. The patient has had some low back pain and left hip discomfort, she was found to have a left L4 nerve root impingement, she underwent back surgery by Dr. Joya Salm 3-4 months ago, but she continues to have midline back pain, without radiation into the left hip. The patient indicates that Dr. Joya Salm has now retired, when she has called the office regarding the back pain, she has not received any return phone calls. She will be following up with a different neurosurgeon in the near future. The patient continues to have daily headaches, about 10 days of the month the headaches are quite severe. The patient may have vertigo and nausea and vomiting. If she is able to vomit and then sleep, this will help the sensations. This will happen 3 or 4 times a month. The patient does have photophobia with the headache, not phonophobia. In the past, the patient has been treated with multiple medications including Topamax, meloxicam, hydrocodone, trazodone, tricyclic antidepressant medications, Norvasc, gabapentin, and Cozaar without any benefit with her headache. The patient returns to this office for an evaluation.  Past Medical History:  Diagnosis Date  . Anemia   . Arthritis    lumbar region- radiculopathy  . Cancer [facility]) 2013    Thyroid Ca-thyroidectomy  . Chronic fatigue   . DM (diabetes mellitus) ([place])   . Gait disturbance   . GERD (gastroesophageal reflux disease)   . Headache(784.0)   . HTN (hypertension)   . Hypothyroidism   . Nephrolithiasis   . Obesity   . OSA (obstructive sleep apnea)    last test- many yrs. ago, done at [facility], no CPAP in use  . Osteoporosis   . Peripheral neuropathy ([place])   . Prolonged QT interval   . Sarcoidosis ([place])    sarcoidosis in Brain,  treated with surgery    Past Surgical History:  Procedure Laterality Date  . BRAIN SURGERY  2001   for sarcoidosis  . BREAST SURGERY Bilateral    biopsies   . DILATION AND CURETTAGE OF UTERUS    . HYSTEROSCOPY    . LUMBAR LAMINECTOMY/DECOMPRESSION MICRODISCECTOMY Left 08/20/2016   Procedure: Left Lumbar four-five Microdiskectomy;  Surgeon: Leeroy Cha, MD;  Location: [place] [facility];  Service: Neurosurgery;  Laterality: Left;  . PARTIAL HYSTERECTOMY  2013  . ROTATOR CUFF REPAIR Right    07/2013  . Suboccipital craniotomy    . THYROIDECTOMY     Precancerous lesion    Family History  Problem Relation Age of Onset  . Heart disease Mother     questionable CAD and arrythmias   . Cancer Mother     Breast cancer  . Venous thrombosis Sister   . Diabetes Sister   . Heart disease Sister   . Diabetes Brother   . Heart disease Brother   . Cancer Maternal Aunt   . Venous thrombosis Sister   . Diabetes Sister   . Heart disease Sister     Social history:  reports that she has never smoked. She has never used smokeless tobacco. She reports that she does not drink alcohol or use drugs.   No Known Allergies  Medications:  Prior to Admission medications   Medication Sig Start Date End Date Taking? Authorizing Provider  aspirin 81 MG chewable tablet  Chew 81 mg by mouth daily.   Yes Historical Provider, MD  atorvastatin (LIPITOR) 20 MG tablet TAKE 1 TABLET BY MOUTH EVERY EVENING 05/01/16  Yes Collier Salina, MD  [facility] SENNA PLUS 8.6-50 MG tablet TAKE 2 TABLETS TWICE A DAY 12/12/16  Yes Collier Salina, MD  exenatide (BYETTA 5 MCG PEN) 5 MCG/0.02ML SOPN injection Inject 0.02 mLs (5 mcg total) into the skin 2 (two) times daily with a meal. 11/21/16 11/21/17 Yes Collier Salina, MD  folic acid (FOLVITE) 1 MG tablet TAKE 1 TABLET BY MOUTH EVERY DAY 08/25/16  Yes Collier Salina, MD  GAVILYTE-G 236 g solution TAKE 240 ML EVERY 15 MINUTES ORALLY 16 DOSE(S) 10/27/16  Yes Historical  Provider, MD  HYDROcodone-acetaminophen (NORCO) 10-325 MG tablet TAKE 1 TABLET BY ORAL ROUTE EVERY 6 HOURS AS NEEDED FOR PAIN 12/22/16  Yes Kathrynn Ducking, MD  Insulin Pen Needle (PEN NEEDLES) 32G X 4 MM MISC 30 Syringes by Does not apply route daily. 11/21/16  Yes Collier Salina, MD  levothyroxine (SYNTHROID, LEVOTHROID) 137 MCG tablet TAKE 1 TABLET BY MOUTH EVERY DAY BEFORE BREAKFAST 10/01/16  Yes Collier Salina, MD  losartan (COZAAR) 50 MG tablet TAKE 1 TABLET BY MOUTH EVERY DAY 09/14/16  Yes Collier Salina, MD  metFORMIN (GLUCOPHAGE) 1000 MG tablet TAKE 1 TABLET BY MOUTH TWICE A DAY WITH A MEAL 11/19/16  Yes Collier Salina, MD  methotrexate (RHEUMATREX) 2.5 MG tablet TAKE 4 TABLETS (10 MG TOTAL) BY MOUTH ONCE A WEEK. CAUTION:CHEMOTHERAPY. PROTECT FROM LIGHT. 12/22/16  Yes Kathrynn Ducking, MD  Multiple Vitamin (MULTIVITAMIN WITH MINERALS) TABS tablet Take 1 tablet by mouth every evening.    Yes Historical Provider, MD  pantoprazole (PROTONIX) 20 MG tablet TAKE 1 TABLET BY MOUTH DAILY Patient taking differently: TAKE 1 TABLET BY MOUTH DAILY  takes in the a.m. 06/09/16  Yes Collier Salina, MD    ROS:  Out of a complete 14 system review of symptoms, the patient complains only of the following symptoms, and all other reviewed systems are negative.  Frequent waking, daytime sleepiness Headache Low back pain  Blood pressure 131/83, pulse 83, height 5\' 8"  (1.727 m), weight 213 lb (96.6 kg), last menstrual period 12/03/2011.  Physical Exam  General: The patient is alert and cooperative at the time of the examination. The patient is moderately to markedly obese.  Skin: No significant peripheral edema is noted.   Neurologic Exam  Mental status: The patient is alert and oriented x 3 at the time of the examination. The patient has apparent normal recent and remote memory, with an apparently normal attention span and concentration ability.   Cranial nerves: Facial symmetry  is present. Speech is normal, no aphasia or dysarthria is noted. Extraocular movements are full. Visual fields are full.  Motor: The patient has good strength in all 4 extremities.  Sensory examination: Soft touch sensation is symmetric on the face, arms, and legs.  Coordination: The patient has good finger-nose-finger and heel-to-shin bilaterally.  Gait and station: The patient has a normal gait. Tandem gait is unsteady. Romberg is negative, but is unsteady. No drift is seen.  Reflexes: Deep tendon reflexes are symmetric.   Assessment/Plan:  1. Neurosarcoidosis  2. Chronic daily headaches, migraine  3. Low back pain  The patient will be following up with another neurosurgeon in the near future, I will give her a prescription for hydrocodone now. The patient may be candidate for Botox injections for her  headaches, she wishes to "think about it" and decide if she will try the Botox therapy in the future. The patient will have blood work done today, she will follow-up in 6 months.  Jill Alexanders MD 12/22/2016 11:24 AM  [facility] [address] [place], [place] [address]  Phone [phone] Fax [phone]

## 2016-12-22 NOTE — Telephone Encounter (Signed)
Pt did arrive but was late for her appt. Said that she thought that she was supposed to be here at 10:30.

## 2016-12-23 LAB — CBC WITH DIFFERENTIAL/PLATELET
BASOS ABS: 0 10*3/uL (ref 0.0–0.2)
Basos: 0 %
EOS (ABSOLUTE): 0.1 10*3/uL (ref 0.0–0.4)
Eos: 1 %
Hematocrit: 37.1 % (ref 34.0–46.6)
Hemoglobin: 11.6 g/dL (ref 11.1–15.9)
Immature Grans (Abs): 0 10*3/uL (ref 0.0–0.1)
Immature Granulocytes: 0 %
LYMPHS ABS: 1.9 10*3/uL (ref 0.7–3.1)
Lymphs: 33 %
MCH: 27.6 pg (ref 26.6–33.0)
MCHC: 31.3 g/dL — AB (ref 31.5–35.7)
MCV: 88 fL (ref 79–97)
MONOS ABS: 0.3 10*3/uL (ref 0.1–0.9)
Monocytes: 6 %
NEUTROS ABS: 3.4 10*3/uL (ref 1.4–7.0)
Neutrophils: 60 %
PLATELETS: 343 10*3/uL (ref 150–379)
RBC: 4.21 x10E6/uL (ref 3.77–5.28)
RDW: 15 % (ref 12.3–15.4)
WBC: 5.7 10*3/uL (ref 3.4–10.8)

## 2016-12-23 LAB — COMPREHENSIVE METABOLIC PANEL
A/G RATIO: 1.5 (ref 1.2–2.2)
ALBUMIN: 4.3 g/dL (ref 3.5–5.5)
ALK PHOS: 72 IU/L (ref 39–117)
ALT: 24 IU/L (ref 0–32)
AST: 21 IU/L (ref 0–40)
BUN / CREAT RATIO: 24 — AB (ref 9–23)
BUN: 17 mg/dL (ref 6–24)
CHLORIDE: 103 mmol/L (ref 96–106)
CO2: 25 mmol/L (ref 18–29)
Calcium: 9 mg/dL (ref 8.7–10.2)
Creatinine, Ser: 0.71 mg/dL (ref 0.57–1.00)
GFR calc non Af Amer: 100 mL/min/{1.73_m2} (ref >59)
GFR, EST AFRICAN AMERICAN: 116 mL/min/{1.73_m2} (ref >59)
Globulin, Total: 2.8 g/dL (ref 1.5–4.5)
Glucose: 150 mg/dL — ABNORMAL HIGH (ref 65–99)
POTASSIUM: 5 mmol/L (ref 3.5–5.2)
Sodium: 145 mmol/L — ABNORMAL HIGH (ref 134–144)
TOTAL PROTEIN: 7.1 g/dL (ref 6.0–8.5)

## 2016-12-24 ENCOUNTER — Telehealth: Payer: Self-pay

## 2016-12-24 NOTE — Telephone Encounter (Signed)
-----   Message from Kathrynn Ducking, MD sent at 12/23/2016 10:45 AM EST -----  The blood work results are unremarkable. With exception of minimal elevation of sodium level, not likely to be clinically significant. Please call the patient ----- Message ----- From: Interface, [facility] Lab Results In Sent: 12/23/2016   7:43 AM To: Kathrynn Ducking, MD

## 2016-12-24 NOTE — Telephone Encounter (Signed)
Called pt w/ unremarkable lab results. Verbalized understanding and appreciation for call. 

## 2016-12-30 ENCOUNTER — Other Ambulatory Visit: Payer: Self-pay | Admitting: Internal Medicine

## 2016-12-30 DIAGNOSIS — Z1231 Encounter for screening mammogram for malignant neoplasm of breast: Secondary | ICD-10-CM

## 2016-12-31 ENCOUNTER — Other Ambulatory Visit: Payer: Self-pay | Admitting: Internal Medicine

## 2017-01-06 ENCOUNTER — Other Ambulatory Visit: Payer: Self-pay | Admitting: Internal Medicine

## 2017-01-09 ENCOUNTER — Ambulatory Visit
Admission: RE | Admit: 2017-01-09 | Discharge: 2017-01-09 | Disposition: A | Discharge status: Home / Self Care | Payer: Medicaid Other | Source: Ambulatory Visit | Attending: Family Medicine | Admitting: Family Medicine

## 2017-01-09 DIAGNOSIS — Z1231 Encounter for screening mammogram for malignant neoplasm of breast: Secondary | ICD-10-CM

## 2017-01-13 LAB — HM DIABETES EYE EXAM

## 2017-01-21 ENCOUNTER — Other Ambulatory Visit: Payer: Self-pay | Admitting: Neurology

## 2017-01-27 ENCOUNTER — Encounter: Payer: Self-pay | Admitting: *Deleted

## 2017-02-19 ENCOUNTER — Telehealth: Payer: Self-pay | Admitting: Internal Medicine

## 2017-02-19 NOTE — Telephone Encounter (Signed)
APT. REMINDER CALL, LMTCB °

## 2017-02-20 ENCOUNTER — Other Ambulatory Visit: Payer: Self-pay | Admitting: Neurology

## 2017-02-20 ENCOUNTER — Encounter: Payer: Medicaid Other | Admitting: Internal Medicine

## 2017-02-28 ENCOUNTER — Other Ambulatory Visit: Payer: Self-pay | Admitting: Internal Medicine

## 2017-03-09 ENCOUNTER — Encounter (HOSPITAL_COMMUNITY): Payer: Self-pay | Admitting: Emergency Medicine

## 2017-03-09 ENCOUNTER — Emergency Department (HOSPITAL_COMMUNITY)
Admission: EM | Admit: 2017-03-09 | Discharge: 2017-03-09 | Disposition: A | Discharge status: Home / Self Care | Payer: Medicaid Other | Attending: Emergency Medicine | Admitting: Emergency Medicine

## 2017-03-09 DIAGNOSIS — I10 Essential (primary) hypertension: Secondary | ICD-10-CM | POA: Insufficient documentation

## 2017-03-09 DIAGNOSIS — M5416 Radiculopathy, lumbar region: Secondary | ICD-10-CM | POA: Diagnosis not present

## 2017-03-09 DIAGNOSIS — Z85850 Personal history of malignant neoplasm of thyroid: Secondary | ICD-10-CM | POA: Insufficient documentation

## 2017-03-09 DIAGNOSIS — Z79899 Other long term (current) drug therapy: Secondary | ICD-10-CM | POA: Insufficient documentation

## 2017-03-09 DIAGNOSIS — E039 Hypothyroidism, unspecified: Secondary | ICD-10-CM | POA: Diagnosis not present

## 2017-03-09 DIAGNOSIS — Z794 Long term (current) use of insulin: Secondary | ICD-10-CM | POA: Insufficient documentation

## 2017-03-09 DIAGNOSIS — E119 Type 2 diabetes mellitus without complications: Secondary | ICD-10-CM | POA: Diagnosis not present

## 2017-03-09 DIAGNOSIS — M545 Low back pain: Secondary | ICD-10-CM | POA: Diagnosis present

## 2017-03-09 MED ORDER — IBUPROFEN 600 MG PO TABS: 600.0000 mg | ORAL_TABLET | Freq: Four times a day (QID) | ORAL | 0 refills | Status: DC | PRN

## 2017-03-09 MED ORDER — KETOROLAC TROMETHAMINE 60 MG/2ML IM SOLN
60.0000 mg | Freq: Once | INTRAMUSCULAR | Status: AC
  Administered 2017-03-09: 60 mg via INTRAMUSCULAR
  Filled 2017-03-09: qty 2

## 2017-03-09 MED ORDER — METHOCARBAMOL 500 MG PO TABS: 1000.0000 mg | ORAL_TABLET | Freq: Three times a day (TID) | ORAL | 0 refills | Status: DC | PRN

## 2017-03-09 MED ORDER — METHOCARBAMOL 500 MG PO TABS
1000.0000 mg | ORAL_TABLET | Freq: Once | ORAL | Status: AC
  Administered 2017-03-09: 1000 mg via ORAL
  Filled 2017-03-09: qty 2

## 2017-03-09 NOTE — ED Provider Notes (Signed)
[name] [name] DEPT Provider Note   CSN: [id number] Arrival date & time: 03/09/17  1843     History   Chief Complaint Chief Complaint  Patient presents with  . Back Pain    HPI [name] [name] is a 51 y.o. female.  HPI Patient with chronic low back pain and lumbar laminectomy surgery done in October of last year presents with ongoing low back pain radiating down the left leg. She states she's had this pain now for several months. She is not followed up with her neurosurgeon because she says he has retired. She denies any urinary incontinence. No focal weakness or numbness. No fever or chills. Past Medical History:  Diagnosis Date  . Anemia   . Arthritis    lumbar region- radiculopathy  . Cancer [facility]) 2013    Thyroid Ca-thyroidectomy  . Chronic fatigue   . DM (diabetes mellitus) ([place])   . Gait disturbance   . GERD (gastroesophageal reflux disease)   . Headache(784.0)   . HTN (hypertension)   . Hypothyroidism   . Nephrolithiasis   . Obesity   . OSA (obstructive sleep apnea)    last test- many yrs. ago, done at [facility], no CPAP in use  . Osteoporosis   . Peripheral neuropathy ([place])   . Prolonged QT interval   . Sarcoidosis ([place])    sarcoidosis in Brain, treated with surgery    Patient Active Problem List   Diagnosis Date Noted  . Obesity 11/24/2016  . Herniated lumbar disc without myelopathy 08/20/2016  . Healthcare maintenance 02/18/2016  . Erythema nodosum 02/01/2016  . Intractable migraine without aura 02/09/2013  . Hypothyroidism 05/08/2011  . FIBROIDS, UTERUS 11/06/2010  . HYPERLIPIDEMIA 01/28/2010  . Sciatica of left side associated with disorder of lumbar spine 10/20/2007  . Neurosarcoidosis ([place]) 05/25/2007  . Diabetes mellitus type 2, controlled ([place]) 05/25/2007  . OBSTRUCTIVE SLEEP APNEA 05/25/2007  . Essential hypertension 05/25/2007  . GERD 05/25/2007  . History of osteoporosis 05/25/2007  . LONG QT SYNDROME 05/25/2007    Past Surgical History:    Procedure Laterality Date  . BRAIN SURGERY  2001   for sarcoidosis  . BREAST BIOPSY Left   . BREAST BIOPSY Right   . BREAST SURGERY Bilateral    biopsies   . DILATION AND CURETTAGE OF UTERUS    . HYSTEROSCOPY    . LUMBAR LAMINECTOMY/DECOMPRESSION MICRODISCECTOMY Left 08/20/2016   Procedure: Left Lumbar four-five Microdiskectomy;  Surgeon: Leeroy Cha, MD;  Location: [place] [facility];  Service: Neurosurgery;  Laterality: Left;  . PARTIAL HYSTERECTOMY  2013  . ROTATOR CUFF REPAIR Right    07/2013  . Suboccipital craniotomy    . THYROIDECTOMY     Precancerous lesion    OB History    Gravida Para Term Preterm AB Living   5 4       4    SAB TAB Ectopic Multiple Live Births                   Home Medications    Prior to Admission medications   Medication Sig Start Date End Date Taking? Authorizing Provider  folic acid (FOLVITE) 1 MG tablet TAKE 1 TABLET BY MOUTH EVERY DAY 08/25/16  Yes Collier Salina, MD  losartan (COZAAR) 50 MG tablet TAKE 1 TABLET BY MOUTH EVERY DAY 09/14/16  Yes Collier Salina, MD  metFORMIN (GLUCOPHAGE) 1000 MG tablet TAKE 1 TABLET BY MOUTH TWICE A DAY WITH A MEAL 11/19/16  Yes Collier Salina, MD  methotrexate (RHEUMATREX) 2.5 MG tablet TAKE 4 TABLETS (10 MG TOTAL) BY MOUTH ONCE A WEEK. CAUTION:CHEMOTHERAPY. PROTECT FROM LIGHT. Patient taking differently: Take 10 mg by mouth every Saturday. Caution:Chemotherapy. Protect from light. 02/24/17  Yes Kathrynn Ducking, MD  Multiple Vitamin (MULTIVITAMIN WITH MINERALS) TABS tablet Take 1 tablet by mouth every evening.    Yes Historical Provider, MD  aspirin 81 MG chewable tablet Chew 81 mg by mouth daily.    Historical Provider, MD  atorvastatin (LIPITOR) 20 MG tablet TAKE 1 TABLET BY MOUTH EVERY EVENING 01/01/17   Collier Salina, MD  [facility] SENNA PLUS 8.6-50 MG tablet TAKE 2 TABLETS TWICE A DAY 03/03/17   Collier Salina, MD  exenatide (BYETTA 5 MCG PEN) 5 MCG/0.02ML SOPN injection Inject 0.02 mLs (5 mcg  total) into the skin 2 (two) times daily with a meal. 11/21/16 11/21/17  Collier Salina, MD  GAVILYTE-G 236 g solution TAKE 240 ML EVERY 15 MINUTES ORALLY 16 DOSE(S) 10/27/16   Historical Provider, MD  HYDROcodone-acetaminophen (NORCO) 10-325 MG tablet TAKE 1 TABLET BY ORAL ROUTE EVERY 6 HOURS AS NEEDED FOR PAIN 12/22/16   Kathrynn Ducking, MD  ibuprofen (ADVIL,MOTRIN) 600 MG tablet Take 1 tablet (600 mg total) by mouth every 6 (six) hours as needed. 03/09/17   Julianne Rice, MD  Insulin Pen Needle (PEN NEEDLES) 32G X 4 MM MISC 30 Syringes by Does not apply route daily. 11/21/16   Collier Salina, MD  levothyroxine (SYNTHROID, LEVOTHROID) 137 MCG tablet TAKE 1 TABLET BY MOUTH EVERY DAY BEFORE BREAKFAST 01/06/17   Collier Salina, MD  methocarbamol (ROBAXIN) 500 MG tablet Take 2 tablets (1,000 mg total) by mouth every 8 (eight) hours as needed for muscle spasms. 03/09/17   Julianne Rice, MD  pantoprazole (PROTONIX) 20 MG tablet TAKE 1 TABLET BY MOUTH EVERY DAY 12/22/16   Collier Salina, MD    Family History Family History  Problem Relation Age of Onset  . Heart disease Mother     questionable CAD and arrythmias   . Cancer Mother     Breast cancer  . Venous thrombosis Sister   . Diabetes Sister   . Heart disease Sister   . Diabetes Brother   . Heart disease Brother   . Cancer Maternal Aunt   . Venous thrombosis Sister   . Diabetes Sister   . Heart disease Sister     Social History Social History  Substance Use Topics  . Smoking status: Never Smoker  . Smokeless tobacco: Never Used  . Alcohol use No     Allergies   Patient has no known allergies.   Review of Systems Review of Systems  Constitutional: Negative for chills and fever.  Respiratory: Negative for shortness of breath.   Cardiovascular: Negative for chest pain.  Gastrointestinal: Negative for abdominal pain, nausea and vomiting.  Genitourinary: Negative for difficulty urinating.  Musculoskeletal:  Positive for back pain. Negative for neck pain.  Skin: Negative for rash.  Neurological: Negative for weakness, numbness and headaches.  All other systems reviewed and are negative.    Physical Exam Updated Vital Signs BP (!) 143/82 (BP Location: Left Arm)   Pulse (!) 101   Temp 98.3 F (36.8 C) (Oral)   Resp 14   LMP 12/03/2011   SpO2 99%   Physical Exam  Constitutional: She is oriented to person, place, and time. She appears well-developed and well-nourished. No distress.  HENT:  Head: Normocephalic and atraumatic.  Mouth/Throat: Oropharynx  is clear and moist.  Eyes: EOM are normal. Pupils are equal, round, and reactive to light.  Neck: Normal range of motion. Neck supple.  Cardiovascular: Normal rate and regular rhythm.   Pulmonary/Chest: Effort normal and breath sounds normal.  Abdominal: Soft. Bowel sounds are normal. There is no tenderness. There is no rebound and no guarding.  Musculoskeletal: Normal range of motion. She exhibits no edema or tenderness.  Positive straight-leg raise on the left. No lower extremity swelling, asymmetry or tenderness. Distal pulses are 2+. Has midline lumbar tenderness to palpation. No redness, swelling or warmth. No step-offs or deformity.  Neurological: She is alert and oriented to person, place, and time.  5/5 motor in all extremities. Sensation is fully intact. No saddle anesthesia. Patient ambulating without difficulty.  Skin: Skin is warm and dry. No rash noted. No erythema.  Psychiatric: She has a normal mood and affect. Her behavior is normal.  Nursing note and vitals reviewed.    ED Treatments / Results  Labs (all labs ordered are listed, but only abnormal results are displayed) Labs Reviewed - No data to display  EKG  EKG Interpretation None       Radiology No results found.  Procedures Procedures (including critical care time)  Medications Ordered in ED Medications  ketorolac (TORADOL) injection 60 mg (not  administered)  methocarbamol (ROBAXIN) tablet 1,000 mg (not administered)     Initial Impression / Assessment and Plan / ED Course  I have reviewed the triage vital signs and the nursing notes.  Pertinent labs & imaging results that were available during my care of the patient were reviewed by me and considered in my medical decision making (see chart for details).     Patient with ongoing lumbar pain. No red flag signs or symptoms. She is encouraged to follow-up with a neurosurgeon. Return precautions given.  Final Clinical Impressions(s) / ED Diagnoses   Final diagnoses:  Lumbar radiculopathy    New Prescriptions New Prescriptions   IBUPROFEN (ADVIL,MOTRIN) 600 MG TABLET    Take 1 tablet (600 mg total) by mouth every 6 (six) hours as needed.   METHOCARBAMOL (ROBAXIN) 500 MG TABLET    Take 2 tablets (1,000 mg total) by mouth every 8 (eight) hours as needed for muscle spasms.     Julianne Rice, MD 03/09/17 2135

## 2017-03-09 NOTE — ED Notes (Signed)
PT DISCHARGED. INSTRUCTIONS AND PRESCRIPTIONS GIVEN. AAOX4. PT IN NO APPARENT DISTRESS. THE OPPORTUNITY TO ASK QUESTIONS WAS PROVIDED. 

## 2017-03-09 NOTE — ED Triage Notes (Signed)
Pt states she had back surgery at the end of October and now she is having pain in her lower back that radiates down her right side  Pt states she is not feeling well and is c/o general weakness  Pt states she has a brain tumor and her last chemo treatment was Saturday

## 2017-03-27 ENCOUNTER — Other Ambulatory Visit: Payer: Self-pay | Admitting: Internal Medicine

## 2017-04-16 ENCOUNTER — Telehealth: Payer: Self-pay | Admitting: Internal Medicine

## 2017-04-16 NOTE — Telephone Encounter (Signed)
APT. REMINDER CALL, LMTCB °

## 2017-04-17 ENCOUNTER — Ambulatory Visit (INDEPENDENT_AMBULATORY_CARE_PROVIDER_SITE_OTHER): Payer: Medicaid Other | Admitting: Internal Medicine

## 2017-04-17 ENCOUNTER — Encounter: Payer: Self-pay | Admitting: Internal Medicine

## 2017-04-17 ENCOUNTER — Encounter (INDEPENDENT_AMBULATORY_CARE_PROVIDER_SITE_OTHER): Payer: Self-pay

## 2017-04-17 VITALS — BP 139/85 | HR 78 | Temp 97.7°F | Ht 68.0 in | Wt 213.1 lb

## 2017-04-17 DIAGNOSIS — E119 Type 2 diabetes mellitus without complications: Secondary | ICD-10-CM | POA: Diagnosis not present

## 2017-04-17 DIAGNOSIS — D8689 Sarcoidosis of other sites: Secondary | ICD-10-CM

## 2017-04-17 DIAGNOSIS — M5442 Lumbago with sciatica, left side: Secondary | ICD-10-CM

## 2017-04-17 DIAGNOSIS — R87619 Unspecified abnormal cytological findings in specimens from cervix uteri: Secondary | ICD-10-CM

## 2017-04-17 DIAGNOSIS — R070 Pain in throat: Secondary | ICD-10-CM | POA: Diagnosis not present

## 2017-04-17 DIAGNOSIS — Z7984 Long term (current) use of oral hypoglycemic drugs: Secondary | ICD-10-CM | POA: Diagnosis not present

## 2017-04-17 DIAGNOSIS — M5386 Other specified dorsopathies, lumbar region: Secondary | ICD-10-CM

## 2017-04-17 HISTORY — DX: Unspecified abnormal cytological findings in specimens from cervix uteri: R87.619

## 2017-04-17 LAB — POCT GLYCOSYLATED HEMOGLOBIN (HGB A1C): Hemoglobin A1C: 6.7

## 2017-04-17 LAB — GLUCOSE, CAPILLARY: GLUCOSE-CAPILLARY: 76 mg/dL (ref 65–99)

## 2017-04-17 MED ORDER — CETIRIZINE HCL 10 MG PO TABS: 10.0000 mg | ORAL_TABLET | Freq: Every day | ORAL | Status: DC

## 2017-04-17 NOTE — Progress Notes (Signed)
   CC: Back pain  HPI:  [name] is a 51 y.o. woman here today with worsening lower back and left leg pain.   See problem based assessment and plan below for additional details  Past Medical History:  Diagnosis Date  . Anemia   . Arthritis    lumbar region- radiculopathy  . Cancer [facility] ) 2013    Thyroid Ca-thyroidectomy  . Chronic fatigue   . DM (diabetes mellitus) ([place])   . Gait disturbance   . GERD (gastroesophageal reflux disease)   . Headache(784.0)   . HTN (hypertension)   . Hypothyroidism   . Nephrolithiasis   . Obesity   . OSA (obstructive sleep apnea)    last test- many yrs. ago, done at [facility], no CPAP in use  . Osteoporosis   . Peripheral neuropathy   . Prolonged QT interval   . Sarcoidosis    sarcoidosis in Brain, treated with surgery    Review of Systems:  Review of Systems  Constitutional: Negative for fever.  HENT: Positive for sore throat. Negative for congestion.   Eyes: Negative for blurred vision.  Respiratory: Negative for cough and sputum production.   Cardiovascular: Positive for leg swelling. Negative for chest pain.  Genitourinary: Negative for dysuria, frequency and urgency.  Musculoskeletal: Positive for back pain.  Skin: Negative for itching and rash.  Neurological: Negative for sensory change, focal weakness and weakness.  Endo/Heme/Allergies: Negative for environmental allergies.    Physical Exam: Physical Exam  Constitutional: She is well-developed, well-nourished, and in no distress.  HENT:  Mouth/Throat: Oropharynx is clear and moist. No oropharyngeal exudate.  Eyes: Conjunctivae are normal.  Neck: Normal range of motion. Neck supple. No thyromegaly present.  Cardiovascular: Normal rate and regular rhythm.   Pulmonary/Chest: Effort normal and breath sounds normal.  Musculoskeletal:  Left leg with nonpitting swelling to ankle Pain with straight leg raise at 40 degrees and FABER Right leg unermarkable No lower back  pain to direct palpation  Lymphadenopathy:    She has no cervical adenopathy.  Skin: No rash noted.  Psychiatric: Mood and affect normal.    Vitals:   04/17/17 1555  BP: 139/85  Pulse: 78  Temp: 97.7 F (36.5 C)  TempSrc: Oral  SpO2: 100%  Weight: 213 lb 1.6 oz (96.7 kg)  Height: 5\' 8"  (1.727 m)    Assessment & Plan:   See Encounters Tab for problem based charting.  Patient discussed with Dr. Angelia Mould

## 2017-04-17 NOTE — Patient Instructions (Signed)
It was a pleasure to see you today [name].  You are doing a fantastic job controlling your diabetes, keep it up. We will check some blood work today to make sure there is no evidence of methotrexate toxicity or liver problems. I recommend trying an over the counter antihistamine such as cetirizine (Zyrtec) or loratadine (Claritin) for your sore throat. If these do not help then it is most likely unrelated to any allergen exposure.  For your back I recommend taking tylenol no more than 3,000 mg per day as needed for pain. We will also be getting an xray of your back to look for any movement or problem in your surgery site. I will give you a call with the results of this xray and if we will need to get additional imaging.

## 2017-04-18 LAB — CBC
Hematocrit: 36.4 % (ref 34.0–46.6)
Hemoglobin: 11.7 g/dL (ref 11.1–15.9)
MCH: 27.6 pg (ref 26.6–33.0)
MCHC: 32.1 g/dL (ref 31.5–35.7)
MCV: 86 fL (ref 79–97)
PLATELETS: 312 10*3/uL (ref 150–379)
RBC: 4.24 x10E6/uL (ref 3.77–5.28)
RDW: 15.3 % (ref 12.3–15.4)
WBC: 6.1 10*3/uL (ref 3.4–10.8)

## 2017-04-18 LAB — CMP14 + ANION GAP
A/G RATIO: 1.4 (ref 1.2–2.2)
ALT: 15 IU/L (ref 0–32)
AST: 21 IU/L (ref 0–40)
Albumin: 4.2 g/dL (ref 3.5–5.5)
Alkaline Phosphatase: 62 IU/L (ref 39–117)
Anion Gap: 17 mmol/L (ref 10.0–18.0)
BUN/Creatinine Ratio: 18 (ref 9–23)
BUN: 15 mg/dL (ref 6–24)
Bilirubin Total: 0.3 mg/dL (ref 0.0–1.2)
CALCIUM: 8.9 mg/dL (ref 8.7–10.2)
CO2: 25 mmol/L (ref 18–29)
CREATININE: 0.84 mg/dL (ref 0.57–1.00)
Chloride: 104 mmol/L (ref 96–106)
GFR, EST AFRICAN AMERICAN: 94 mL/min/{1.73_m2} (ref >59)
GFR, EST NON AFRICAN AMERICAN: 82 mL/min/{1.73_m2} (ref >59)
GLOBULIN, TOTAL: 2.9 g/dL (ref 1.5–4.5)
Glucose: 108 mg/dL — ABNORMAL HIGH (ref 65–99)
POTASSIUM: 4.2 mmol/L (ref 3.5–5.2)
SODIUM: 146 mmol/L — AB (ref 134–144)
Total Protein: 7.1 g/dL (ref 6.0–8.5)

## 2017-04-20 ENCOUNTER — Other Ambulatory Visit: Payer: Self-pay | Admitting: Internal Medicine

## 2017-04-20 NOTE — Assessment & Plan Note (Signed)
Her headaches have been well controlled since getting back on her usual methotrexate 10mg  weekly. We will check CBC and Cmet today for monitoring of high risk prescription medications.

## 2017-04-20 NOTE — Assessment & Plan Note (Signed)
HPI: She does not check blood sugars at home regularly. She stopped using exenatide due to abdominal bloating and discomfort with this. Since her back surgery she has resumed most of her previous physical activity and goes to the gym 3-4 times weekly. She has been asymptomatic and Hgb A1c is 6.7% today suggestive good control with metformin, diet, and exercise.  A: Well controlled non-insulin dependent type 2 diabetes  P: Continue metformin 1000mg  BID

## 2017-04-20 NOTE — Assessment & Plan Note (Signed)
HPI: She underwent back surgery for her lower back pain and sciatica with good improvement in her symptoms. She had resumed increased physical activity. However she now reports worsening of her left leg pains similar to before she had her surgery. Follow up with her surgeon's office recommended repeat MRI to ensure hardware position but this was declined by insurance and she is not sure why. Coordination of care has been complicated due to retirement of her original Psychologist, sport and exercise since her procedure.  A: Some asymmetric leg swelling and pain are concerning for a problem with her her lower back surgery. I think repeat imaging at the least is warranted. She is physically quite active so I do not see why her range of motion should be so restricted on the left side.  P: Lumbar spine xray ordered today If abnormal or normal but symptoms worsening would pursue MRI to reevaluate her lumbar spine

## 2017-04-20 NOTE — Assessment & Plan Note (Signed)
HPI: She reports 8 weeks of intermittent throat pain not associated with any cough, drainage, hoarseness, or dysphagia. She is anxious about this due to her history of thyroid cancer s/p thyroidectomy. She does not typically suffer seasonal allergies and denies any sinus congestion. She does have a history of GERD but is on antacids without symptoms at this time.  A: She has a new intermittent throat soreness without physical exam findings. There are multiple possible causes. Very mild seasonal allergies or GERD would be fairly benign causes. Much more concerning but less likely would be mucositis related to her methotrexate use. This is more common within a few months of starting the medication but she has been on it long term, although temporarily interrupted yesterday.  P: Recommend starting an OTC second generation antihistamine as empiric treatment for seasonal allergies If throat pain worsens or she develops other related symptoms we will need to reassess

## 2017-04-21 ENCOUNTER — Telehealth: Payer: Self-pay | Admitting: Neurology

## 2017-04-21 ENCOUNTER — Ambulatory Visit (HOSPITAL_COMMUNITY)
Admission: RE | Admit: 2017-04-21 | Discharge: 2017-04-21 | Disposition: A | Discharge status: Home / Self Care | Payer: Medicaid Other | Source: Ambulatory Visit | Attending: Internal Medicine | Admitting: Internal Medicine

## 2017-04-21 DIAGNOSIS — M539 Dorsopathy, unspecified: Secondary | ICD-10-CM | POA: Insufficient documentation

## 2017-04-21 DIAGNOSIS — M5386 Other specified dorsopathies, lumbar region: Secondary | ICD-10-CM

## 2017-04-21 MED ORDER — HYDROCODONE-ACETAMINOPHEN 10-325 MG PO TABS: ORAL_TABLET | ORAL | 0 refills | Status: DC

## 2017-04-21 NOTE — Telephone Encounter (Signed)
I called patient. She is having increased headaches over the last 3 days. I will write a prescription for her hydrocodone, she is due to pick it up.

## 2017-04-21 NOTE — Addendum Note (Signed)
Addended by: Kathrynn Ducking on: 04/21/2017 01:29 PM   Modules accepted: Orders

## 2017-04-21 NOTE — Telephone Encounter (Signed)
Patient called office in reference to having bad headaches again starting 3 days ago patient has been taking tylenol that is not working.  Patient requesting to see if she is able to have something for pain.  Pharmacy- [facility]

## 2017-04-28 NOTE — Progress Notes (Signed)
Internal Medicine Clinic Attending  Case discussed with Dr. Rice at the time of the visit.  We reviewed the resident's history and exam and pertinent patient test results.  I agree with the assessment, diagnosis, and plan of care documented in the resident's note.  

## 2017-05-10 ENCOUNTER — Other Ambulatory Visit: Payer: Self-pay | Admitting: Internal Medicine

## 2017-05-11 ENCOUNTER — Emergency Department (HOSPITAL_COMMUNITY)
Admission: EM | Admit: 2017-05-11 | Discharge: 2017-05-11 | Disposition: A | Discharge status: Home / Self Care | Payer: Medicaid Other | Attending: Emergency Medicine | Admitting: Emergency Medicine

## 2017-05-11 ENCOUNTER — Emergency Department (HOSPITAL_COMMUNITY): Payer: Medicaid Other

## 2017-05-11 ENCOUNTER — Encounter (HOSPITAL_COMMUNITY): Payer: Self-pay

## 2017-05-11 DIAGNOSIS — E039 Hypothyroidism, unspecified: Secondary | ICD-10-CM | POA: Diagnosis not present

## 2017-05-11 DIAGNOSIS — Z85850 Personal history of malignant neoplasm of thyroid: Secondary | ICD-10-CM | POA: Diagnosis not present

## 2017-05-11 DIAGNOSIS — Z79899 Other long term (current) drug therapy: Secondary | ICD-10-CM | POA: Diagnosis not present

## 2017-05-11 DIAGNOSIS — N3001 Acute cystitis with hematuria: Secondary | ICD-10-CM | POA: Insufficient documentation

## 2017-05-11 DIAGNOSIS — I10 Essential (primary) hypertension: Secondary | ICD-10-CM | POA: Insufficient documentation

## 2017-05-11 DIAGNOSIS — Z794 Long term (current) use of insulin: Secondary | ICD-10-CM | POA: Diagnosis not present

## 2017-05-11 DIAGNOSIS — E119 Type 2 diabetes mellitus without complications: Secondary | ICD-10-CM | POA: Diagnosis not present

## 2017-05-11 DIAGNOSIS — R319 Hematuria, unspecified: Secondary | ICD-10-CM | POA: Diagnosis present

## 2017-05-11 LAB — URINALYSIS, ROUTINE W REFLEX MICROSCOPIC
Bilirubin Urine: NEGATIVE
GLUCOSE, UA: NEGATIVE mg/dL
Ketones, ur: NEGATIVE mg/dL
NITRITE: NEGATIVE
PH: 7 (ref 5.0–8.0)
Protein, ur: 30 mg/dL — AB
SPECIFIC GRAVITY, URINE: 1.013 (ref 1.005–1.030)
Squamous Epithelial / LPF: NONE SEEN

## 2017-05-11 MED ORDER — KETOROLAC TROMETHAMINE 60 MG/2ML IM SOLN
60.0000 mg | Freq: Once | INTRAMUSCULAR | Status: AC
  Administered 2017-05-11: 60 mg via INTRAMUSCULAR
  Filled 2017-05-11: qty 2

## 2017-05-11 MED ORDER — PHENAZOPYRIDINE HCL 200 MG PO TABS: 200.0000 mg | ORAL_TABLET | Freq: Three times a day (TID) | ORAL | 0 refills | Status: DC

## 2017-05-11 MED ORDER — HYDROCODONE-ACETAMINOPHEN 5-325 MG PO TABS
2.0000 | ORAL_TABLET | Freq: Once | ORAL | Status: AC
  Administered 2017-05-11: 2 via ORAL
  Filled 2017-05-11: qty 2

## 2017-05-11 MED ORDER — HYDROCODONE-ACETAMINOPHEN 5-325 MG PO TABS: 1.0000 | ORAL_TABLET | Freq: Four times a day (QID) | ORAL | 0 refills | Status: DC | PRN

## 2017-05-11 MED ORDER — CIPROFLOXACIN HCL 500 MG PO TABS: 500.0000 mg | ORAL_TABLET | Freq: Two times a day (BID) | ORAL | 0 refills | Status: DC

## 2017-05-11 NOTE — ED Triage Notes (Signed)
Pt c/o lower abdominal pain and hematuria starting today.  Pain score 8/10.  Hx of brain CA and kidney stones.  Last chemo x 2 days ago.

## 2017-05-11 NOTE — ED Provider Notes (Signed)
[facility] DEPT Provider Note   CSN: [id number] Arrival date & time: 05/11/17  1652     History   Chief Complaint Chief Complaint  Patient presents with  . CA PT  . Abdominal Pain  . Hematuria    HPI [name] [name] is a 51 y.o. female.  Patient is a 51 year old female with past medical history of hypertension, kidney stones, diabetes. She presents today for complaints of lower abdominal pain and hematuria. This started 2 days ago. She reports burning with urination and pain in both flanks. She is concerned she may have a kidney stone. She denies any fevers or chills.   The history is provided by the patient.  Abdominal Pain   This is a new problem. The current episode started yesterday. The problem occurs constantly. The problem has been rapidly worsening. The pain is located in the suprapubic region. The quality of the pain is burning. Associated symptoms include hematuria. Nothing aggravates the symptoms. Nothing relieves the symptoms.  Hematuria  Associated symptoms include abdominal pain.    Past Medical History:  Diagnosis Date  . Anemia   . Arthritis    lumbar region- radiculopathy  . Cancer [facility]) 2013    Thyroid Ca-thyroidectomy  . Chronic fatigue   . DM (diabetes mellitus) ([place])   . Gait disturbance   . GERD (gastroesophageal reflux disease)   . Headache(784.0)   . HTN (hypertension)   . Hypothyroidism   . Nephrolithiasis   . Obesity   . OSA (obstructive sleep apnea)    last test- many yrs. ago, done at [facility] no CPAP in use  . Osteoporosis   . Peripheral neuropathy   . Prolonged QT interval   . 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 Abnormal cervical Papanicolaou smear 04/17/2017  . Throat pain 04/17/2017  . Obesity 11/24/2016  . Herniated lumbar disc without myelopathy 08/20/2016  . Healthcare maintenance 02/18/2016  . Erythema nodosum 02/01/2016  . Intractable  migraine without aura 02/09/2013  . Hypothyroidism 05/08/2011  . FIBROIDS, UTERUS 11/06/2010  . HYPERLIPIDEMIA 01/28/2010  . Sciatica of left side associated with disorder of lumbar spine 10/20/2007  . 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 Diabetes mellitus type 2, controlled ([place]) 05/25/2007  . OBSTRUCTIVE SLEEP APNEA 05/25/2007  . Essential hypertension 05/25/2007  . Gastroesophageal reflux disease 05/25/2007  . History of osteoporosis 05/25/2007  . LONG QT SYNDROME 05/25/2007    Past Surgical History:  Procedure Laterality Date  . BRAIN SURGERY  2001   for sarcoidosis  . BREAST BIOPSY Left   . BREAST BIOPSY Right   . BREAST SURGERY Bilateral    biopsies   . DILATION AND CURETTAGE OF UTERUS    . HYSTEROSCOPY    . LUMBAR LAMINECTOMY/DECOMPRESSION MICRODISCECTOMY Left 08/20/2016   Procedure: Left Lumbar four-five Microdiskectomy;  Surgeon: Leeroy Cha, MD;  Location: [facility];  Service: Neurosurgery;  Laterality: Left;  . PARTIAL HYSTERECTOMY  2013  . ROTATOR CUFF REPAIR Right    07/2013  . Suboccipital craniotomy    . THYROIDECTOMY     Precancerous lesion    OB History    Gravida Para Term Preterm AB Living   5 4       4    SAB TAB Ectopic Multiple Live Births                   Home Medications    Prior  to Admission medications   Medication Sig Start Date End Date Taking? Authorizing Provider  aspirin 81 MG chewable tablet Chew 81 mg by mouth daily.   Yes [provider]  atorvastatin (LIPITOR) 20 MG tablet TAKE 1 TABLET BY MOUTH EVERY EVENING 01/01/17  Yes Rice, Resa Miner, MD  [facility] SENNA PLUS 8.6-50 MG tablet TAKE 2 TABLETS TWICE A DAY 03/30/17  Yes Rice, Resa Miner, MD  folic acid (FOLVITE) 1 MG tablet TAKE 1 TABLET BY MOUTH EVERY DAY 08/25/16  Yes Rice, Resa Miner, MD  HYDROcodone-acetaminophen (NORCO) 10-325 MG tablet TAKE 1 TABLET BY ORAL ROUTE EVERY 6 HOURS AS NEEDED FOR PAIN 04/21/17  Yes Kathrynn Ducking, MD  levothyroxine (SYNTHROID,  LEVOTHROID) 137 MCG tablet TAKE 1 TABLET BY MOUTH EVERY DAY BEFORE BREAKFAST 04/22/17  Yes Rice, Resa Miner, MD  losartan (COZAAR) 50 MG tablet TAKE 1 TABLET BY MOUTH EVERY DAY 09/14/16  Yes Rice, Resa Miner, MD  metFORMIN (GLUCOPHAGE) 1000 MG tablet TAKE 1 TABLET BY MOUTH TWICE A DAY WITH A MEAL 05/11/17  Yes Rice, Resa Miner, MD  methotrexate (RHEUMATREX) 2.5 MG tablet TAKE 4 TABLETS (10 MG TOTAL) BY MOUTH ONCE A WEEK. CAUTION:CHEMOTHERAPY. PROTECT FROM LIGHT. Patient taking differently: Take 10 mg by mouth every Saturday. Caution:Chemotherapy. Protect from light. 02/24/17  Yes Kathrynn Ducking, MD  Multiple Vitamin (MULTIVITAMIN WITH MINERALS) TABS tablet Take 1 tablet by mouth every evening.    Yes [provider]  pantoprazole (PROTONIX) 20 MG tablet TAKE 1 TABLET BY MOUTH EVERY DAY 12/22/16  Yes Rice, Resa Miner, MD  ibuprofen (ADVIL,MOTRIN) 600 MG tablet Take 1 tablet (600 mg total) by mouth every 6 (six) hours as needed. Patient not taking: Reported on 05/11/2017 03/09/17   Julianne Rice, MD  Insulin Pen Needle (PEN NEEDLES) 32G X 4 MM MISC 30 Syringes by Does not apply route daily. 11/21/16   Rice, Resa Miner, MD  methocarbamol (ROBAXIN) 500 MG tablet Take 2 tablets (1,000 mg total) by mouth every 8 (eight) hours as needed for muscle spasms. Patient not taking: Reported on 05/11/2017 03/09/17   Julianne Rice, MD    Family History Family History  Problem Relation Age of Onset  . Heart disease Mother        questionable CAD and arrythmias   . Cancer Mother        Breast cancer  . Venous thrombosis Sister   . Diabetes Sister   . Heart disease Sister   . Diabetes Brother   . Heart disease Brother   . Cancer Maternal Aunt   . Venous thrombosis Sister   . Diabetes Sister   . Heart disease Sister     Social History Social History  Substance Use Topics  . Smoking status: Never Smoker  . Smokeless tobacco: Never Used  . Alcohol use No     Allergies     Patient has no known allergies.   Review of Systems Review of Systems  Gastrointestinal: Positive for abdominal pain.  Genitourinary: Positive for hematuria.  All other systems reviewed and are negative.    Physical Exam Updated Vital Signs BP (!) 158/83 (BP Location: Left Arm)   Pulse 93   Temp 98.8 F (37.1 C) (Oral)   Resp 14   Ht 5\' 8"  (1.727 m)   Wt 98 kg (216 lb)   LMP 12/03/2011   SpO2 100%   BMI 32.84 kg/m   Physical Exam  Constitutional: She is oriented to person, place, and time. She  appears well-developed and well-nourished. No distress.  HENT:  Head: Normocephalic and atraumatic.  Neck: Normal range of motion. Neck supple.  Cardiovascular: Normal rate and regular rhythm.  Exam reveals no gallop and no friction rub.   No murmur heard. Pulmonary/Chest: Effort normal and breath sounds normal. No respiratory distress. She has no wheezes.  Abdominal: Soft. Bowel sounds are normal. She exhibits no distension. There is tenderness. There is no rebound and no guarding.  There is tenderness to palpation in the suprapubic region.  Musculoskeletal: Normal range of motion.  Neurological: She is alert and oriented to person, place, and time.  Skin: Skin is warm and dry. She is not diaphoretic.  Nursing note and vitals reviewed.    ED Treatments / Results  Labs (all labs ordered are listed, but only abnormal results are displayed) Labs Reviewed  URINALYSIS, ROUTINE W REFLEX MICROSCOPIC - Abnormal; Notable for the following:       Result Value   APPearance CLOUDY (*)    Hgb urine dipstick LARGE (*)    Protein, ur 30 (*)    Leukocytes, UA LARGE (*)    Bacteria, UA MANY (*)    All other components within normal limits    EKG  EKG Interpretation None       Radiology No results found.  Procedures Procedures (including critical care time)  Medications Ordered in ED Medications  ketorolac (TORADOL) injection 60 mg (not administered)   HYDROcodone-acetaminophen (NORCO/VICODIN) 5-325 MG per tablet 2 tablet (not administered)     Initial Impression / Assessment and Plan / ED Course  I have reviewed the triage vital signs and the nursing notes.  Pertinent labs & imaging results that were available during my care of the patient were reviewed by me and considered in my medical decision making (see chart for details).  CT scan negative for renal calculus. This will be treated as a UTI with antibiotics and follow-up as needed.  Final Clinical Impressions(s) / ED Diagnoses   Final diagnoses:  None    New Prescriptions New Prescriptions   No medications on file     Veryl Speak, MD 05/11/17 2043

## 2017-05-11 NOTE — Discharge Instructions (Signed)
Cipro as prescribed.  Hydrocodone as prescribed.  Hydrocodone as prescribed as needed for pain.  Return to the emergency department if your symptoms significantly worsen or change.

## 2017-05-23 ENCOUNTER — Other Ambulatory Visit: Payer: Self-pay | Admitting: Internal Medicine

## 2017-06-25 ENCOUNTER — Ambulatory Visit (INDEPENDENT_AMBULATORY_CARE_PROVIDER_SITE_OTHER): Payer: Medicaid Other | Admitting: Neurology

## 2017-06-25 ENCOUNTER — Encounter: Payer: Self-pay | Admitting: Neurology

## 2017-06-25 ENCOUNTER — Encounter (INDEPENDENT_AMBULATORY_CARE_PROVIDER_SITE_OTHER): Payer: Self-pay

## 2017-06-25 VITALS — BP 140/82 | HR 102 | Ht 68.0 in | Wt 210.5 lb

## 2017-06-25 DIAGNOSIS — Z5181 Encounter for therapeutic drug level monitoring: Secondary | ICD-10-CM | POA: Diagnosis not present

## 2017-06-25 DIAGNOSIS — M539 Dorsopathy, unspecified: Secondary | ICD-10-CM | POA: Diagnosis not present

## 2017-06-25 DIAGNOSIS — M5386 Other specified dorsopathies, lumbar region: Secondary | ICD-10-CM

## 2017-06-25 DIAGNOSIS — G43019 Migraine without aura, intractable, without status migrainosus: Secondary | ICD-10-CM

## 2017-06-25 DIAGNOSIS — D8689 Sarcoidosis of other sites: Secondary | ICD-10-CM

## 2017-06-25 MED ORDER — HYDROCODONE-ACETAMINOPHEN 5-325 MG PO TABS: 1.0000 | ORAL_TABLET | Freq: Four times a day (QID) | ORAL | 0 refills | Status: DC | PRN

## 2017-06-25 NOTE — Progress Notes (Signed)
Reason for visit:  Headache, back pain  [name] [name] is an 51 y.o. female  History of present illness:   [name] [name] is a 51 year old right-handed black female states history of neurosacoidosis, she also has frequent headache. The patient is having about 4 headache days a week on average. The patent more recently has had significant worsening of her low back pin. The patient has had pain in the back and with sciatica down the left leg to the foot. The patient has seen by neurosurgery, she was set up for physical therapy and she was given a muscle relaxant, but she gained little benefit with these therapies. She is not sleeping well due to the pain. The patient is in pain all of the time now. She has had a decrease in her appetite, she will have several days a month where she spends all day in bed. The patient in general is not functioning well. She reports occasional pain in the right upper quadrant of the abdomen. She returns to this office today for an evaluation.  Past Medical History:  Diagnosis Date  . Anemia   . Arthritis    lumbar region- radiculopathy  . Cancer [facility]) 2013    Thyroid Ca-thyroidectomy  . Chronic fatigue   . DM (diabetes mellitus) ([place])   . Gait disturbance   . GERD (gastroesophageal reflux disease)   . Headache(784.0)   . HTN (hypertension)   . Hypothyroidism   . Nephrolithiasis   . Obesity   . OSA (obstructive sleep apnea)    last test- many yrs. ago, done at [facility], no CPAP in use  . Osteoporosis   . Peripheral neuropathy   . Prolonged QT interval   . 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 BRAIN SURGERY  2001   for sarcoidosis  . BREAST BIOPSY Left   . BREAST BIOPSY Right   . BREAST SURGERY Bilateral    biopsies   . DILATION AND CURETTAGE OF UTERUS    . HYSTEROSCOPY    . LUMBAR LAMINECTOMY/DECOMPRESSION MICRODISCECTOMY Left 08/20/2016   Procedure: Left Lumbar four-five  Microdiskectomy;  Surgeon: Leeroy Cha, MD;  Location: [facility];  Service: Neurosurgery;  Laterality: Left;  . PARTIAL HYSTERECTOMY  2013  . ROTATOR CUFF REPAIR Right    07/2013  . Suboccipital craniotomy    . THYROIDECTOMY     Precancerous lesion    Family History  Problem Relation Age of Onset  . Heart disease Mother        questionable CAD and arrythmias   . Cancer Mother        Breast cancer  . Venous thrombosis Sister   . Diabetes Sister   . Heart disease Sister   . Diabetes Brother   . Heart disease Brother   . Cancer Maternal Aunt   . Venous thrombosis Sister   . Diabetes Sister   . Heart disease Sister     Social history:  reports that she has never smoked. She has never used smokeless tobacco. She reports that she does not drink alcohol or use drugs.   No Known Allergies  Medications:  Prior to Admission medications   Medication Sig Start Date End Date Taking? Authorizing Provider  aspirin 81 MG chewable tablet Chew 81 mg by mouth daily.   Yes [provider]  atorvastatin (LIPITOR) 20 MG tablet TAKE 1 TABLET BY MOUTH EVERY EVENING 01/01/17  Yes Rice, Resa Miner, MD  [facility] SENNA PLUS 8.6-50 MG tablet TAKE 2 TABLETS TWICE A DAY 05/25/17  Yes Rice, Resa Miner, MD  folic acid (FOLVITE) 1 MG tablet TAKE 1 TABLET BY MOUTH EVERY DAY 08/25/16  Yes Rice, Resa Miner, MD  HYDROcodone-acetaminophen (NORCO) 5-325 MG tablet Take 1-2 tablets by mouth every 6 (six) hours as needed. 05/11/17  Yes Delo, Nathaneil Canary, MD  levothyroxine (SYNTHROID, LEVOTHROID) 137 MCG tablet TAKE 1 TABLET BY MOUTH EVERY DAY BEFORE BREAKFAST 04/22/17  Yes Rice, Resa Miner, MD  losartan (COZAAR) 50 MG tablet TAKE 1 TABLET BY MOUTH EVERY DAY 09/14/16  Yes Rice, Resa Miner, MD  metFORMIN (GLUCOPHAGE) 1000 MG tablet TAKE 1 TABLET BY MOUTH TWICE A DAY WITH A MEAL 05/11/17  Yes Rice, Resa Miner, MD  methotrexate (RHEUMATREX) 2.5 MG tablet TAKE 4 TABLETS (10 MG TOTAL) BY MOUTH ONCE A WEEK.  CAUTION:CHEMOTHERAPY. PROTECT FROM LIGHT. Patient taking differently: Take 10 mg by mouth every Saturday. Caution:Chemotherapy. Protect from light. 02/24/17  Yes Kathrynn Ducking, MD  Multiple Vitamin (MULTIVITAMIN WITH MINERALS) TABS tablet Take 1 tablet by mouth every evening.    Yes [provider]  pantoprazole (PROTONIX) 20 MG tablet TAKE 1 TABLET BY MOUTH EVERY DAY 12/22/16  Yes Rice, Resa Miner, MD  phenazopyridine (PYRIDIUM) 200 MG tablet Take 1 tablet (200 mg total) by mouth 3 (three) times daily. 05/11/17  Yes Delo, Nathaneil Canary, MD    ROS:  Out of a complete 14 system review of symptoms, the patient complains only of the following symptoms, and all other reviewed systems are negative.  Decreased appetite, fatigue Shortness of breath Swollen abdomen Nausea Frequent waking Back pain, achy muscles Headache  Blood pressure 140/82, pulse (!) 102, height 5\' 8"  (1.727 m), weight 210 lb 8 oz (95.5 kg), last menstrual period 12/03/2011.  Physical Exam  General: The patient is alert and cooperative at the time of the examination. The patient is moderately obese.  Skin: 1+ edema is noted in the left ankle.   Neurologic Exam  Mental status: The patient is alert and oriented x 3 at the time of the examination. The patient has apparent normal recent and remote memory, with an apparently normal attention span and concentration ability.   Cranial nerves: Facial symmetry is present. Speech is normal, no aphasia or dysarthria is noted. Extraocular movements are full. Visual fields are full.  Motor: The patient has good strength in all 4 extremities.  Sensory examination: Soft touch sensation is symmetric on the face, arms, and legs.  Coordination: The patient has good finger-nose-finger and heel-to-shin bilaterally.  Gait and station: The patient has a normal gait. Tandem gait is unsteady. Romberg is positive, the patient falls backwards. No drift is seen.  Reflexes: Deep  tendon reflexes are symmetric.   Assessment/Plan:  1. Neurosarcoidosis  2. Frequent headache  3. Low back pain, left-sided sciatica  The patient is not doing well with her overall pain level. The patient has had lumbosacral spine surgery, but the pain returned shortly after surgery. The patient will be set up for an epidural steroid injection, she will undergo MRI of the lumbar spine with and without gadolinium enhancement. It has been 2 years since she has been evaluated for her neurosarcoidosis, we will also get MRI of the brain. The patient has signed a form to access [employer] for her headaches. She is not sure that she wants to get Botox injections. A prescription was given for hydrocodone today. Blood work will be  done today.  Jill Alexanders MD 06/25/2017 1:27 PM  [facility] [address] [place], [place] [address]  Phone [phone] Fax [phone]

## 2017-06-25 NOTE — Patient Instructions (Signed)
   We will get MRI of the brain and of the low back, and get an epidural of the low back.

## 2017-06-26 ENCOUNTER — Telehealth: Payer: Self-pay | Admitting: *Deleted

## 2017-06-26 ENCOUNTER — Other Ambulatory Visit: Payer: Self-pay | Admitting: Neurology

## 2017-06-26 DIAGNOSIS — M5386 Other specified dorsopathies, lumbar region: Secondary | ICD-10-CM

## 2017-06-26 LAB — CBC WITH DIFFERENTIAL/PLATELET
BASOS ABS: 0 10*3/uL (ref 0.0–0.2)
Basos: 1 %
EOS (ABSOLUTE): 0.1 10*3/uL (ref 0.0–0.4)
Eos: 2 %
HEMATOCRIT: 39.6 % (ref 34.0–46.6)
HEMOGLOBIN: 12.5 g/dL (ref 11.1–15.9)
Immature Grans (Abs): 0 10*3/uL (ref 0.0–0.1)
Immature Granulocytes: 0 %
LYMPHS ABS: 1.8 10*3/uL (ref 0.7–3.1)
Lymphs: 34 %
MCH: 27.7 pg (ref 26.6–33.0)
MCHC: 31.6 g/dL (ref 31.5–35.7)
MCV: 88 fL (ref 79–97)
MONOCYTES: 6 %
Monocytes Absolute: 0.3 10*3/uL (ref 0.1–0.9)
NEUTROS ABS: 3 10*3/uL (ref 1.4–7.0)
Neutrophils: 57 %
Platelets: 329 10*3/uL (ref 150–379)
RBC: 4.52 x10E6/uL (ref 3.77–5.28)
RDW: 15.1 % (ref 12.3–15.4)
WBC: 5.3 10*3/uL (ref 3.4–10.8)

## 2017-06-26 LAB — COMPREHENSIVE METABOLIC PANEL
ALBUMIN: 4.7 g/dL (ref 3.5–5.5)
ALK PHOS: 75 IU/L (ref 39–117)
ALT: 18 IU/L (ref 0–32)
AST: 22 IU/L (ref 0–40)
Albumin/Globulin Ratio: 1.9 (ref 1.2–2.2)
BILIRUBIN TOTAL: 0.2 mg/dL (ref 0.0–1.2)
BUN / CREAT RATIO: 19 (ref 9–23)
BUN: 16 mg/dL (ref 6–24)
CHLORIDE: 103 mmol/L (ref 96–106)
CO2: 25 mmol/L (ref 20–29)
Calcium: 9.4 mg/dL (ref 8.7–10.2)
Creatinine, Ser: 0.85 mg/dL (ref 0.57–1.00)
GFR calc non Af Amer: 81 mL/min/{1.73_m2} (ref >59)
GFR, EST AFRICAN AMERICAN: 93 mL/min/{1.73_m2} (ref >59)
GLOBULIN, TOTAL: 2.5 g/dL (ref 1.5–4.5)
Glucose: 157 mg/dL — ABNORMAL HIGH (ref 65–99)
Potassium: 4.7 mmol/L (ref 3.5–5.2)
SODIUM: 144 mmol/L (ref 134–144)
TOTAL PROTEIN: 7.2 g/dL (ref 6.0–8.5)

## 2017-06-26 NOTE — Telephone Encounter (Signed)
Faxed completed/signed [employer] service request form and prescription to [facility]. Fax: [phone]. Received confirmation. Also received confirmation page that fax was received.   

## 2017-06-26 NOTE — Telephone Encounter (Signed)
-----   Message from Kathrynn Ducking, MD sent at 06/26/2017  7:12 AM EDT -----  The blood work results are unremarkable. Please call the patient.  ----- Message ----- From: Lavone Neri Lab Results In Sent: 06/26/2017   5:41 AM To: Kathrynn Ducking, MD

## 2017-06-26 NOTE — Telephone Encounter (Signed)
Called and spoke with patient. Advised labs unremarkable per CW,MD note. She verbalized understanding.   Advised referral for [facility] sent to [place] [facility]. Advised her to call back if she does not hear about scheduling within the next week. She verbalized understanding.

## 2017-07-08 ENCOUNTER — Telehealth: Payer: Self-pay | Admitting: Neurology

## 2017-07-08 NOTE — Telephone Encounter (Signed)
I called for the peer to peer review, a time has been set up for 07/09/2017 at 5:45 PM.

## 2017-07-08 NOTE — Telephone Encounter (Signed)
[name] [name] with [facility] just contacted me and informed me that medicaid denied the authorization for the MRI's. The phone number for the peer to peer is [phone] opt 4. The case number is [id number]. She is scheduled right now to have both of these image's done on Monday 07/13/17.

## 2017-07-09 ENCOUNTER — Other Ambulatory Visit: Payer: Self-pay | Admitting: Internal Medicine

## 2017-07-09 NOTE — Telephone Encounter (Signed)
I was able to talk with the physician for the Peer to Peer review, the MRI of the low back as been approved.  Approval number is [id number].

## 2017-07-10 NOTE — Telephone Encounter (Signed)
Noted, thank you

## 2017-07-13 ENCOUNTER — Ambulatory Visit
Admission: RE | Admit: 2017-07-13 | Discharge: 2017-07-13 | Disposition: A | Discharge status: Home / Self Care | Payer: Medicaid Other | Source: Ambulatory Visit | Attending: Neurology | Admitting: Neurology

## 2017-07-13 DIAGNOSIS — D8689 Sarcoidosis of other sites: Secondary | ICD-10-CM

## 2017-07-13 DIAGNOSIS — M5386 Other specified dorsopathies, lumbar region: Secondary | ICD-10-CM

## 2017-07-13 DIAGNOSIS — G43019 Migraine without aura, intractable, without status migrainosus: Secondary | ICD-10-CM

## 2017-07-13 DIAGNOSIS — M539 Dorsopathy, unspecified: Secondary | ICD-10-CM | POA: Diagnosis not present

## 2017-07-13 MED ORDER — GADOBENATE DIMEGLUMINE 529 MG/ML IV SOLN
20.0000 mL | Freq: Once | INTRAVENOUS | Status: AC | PRN
  Administered 2017-07-13: 20 mL via INTRAVENOUS

## 2017-07-14 ENCOUNTER — Telehealth: Payer: Self-pay | Admitting: Neurology

## 2017-07-14 ENCOUNTER — Ambulatory Visit
Admission: RE | Admit: 2017-07-14 | Discharge: 2017-07-14 | Disposition: A | Discharge status: Home / Self Care | Payer: Medicaid Other | Source: Ambulatory Visit | Attending: Neurology | Admitting: Neurology

## 2017-07-14 ENCOUNTER — Other Ambulatory Visit: Payer: Self-pay | Admitting: Neurology

## 2017-07-14 DIAGNOSIS — M5386 Other specified dorsopathies, lumbar region: Secondary | ICD-10-CM

## 2017-07-14 MED ORDER — METHYLPREDNISOLONE ACETATE 40 MG/ML INJ SUSP (RADIOLOG
120.0000 mg | Freq: Once | INTRAMUSCULAR | Status: AC
  Administered 2017-07-14: 120 mg via EPIDURAL

## 2017-07-14 MED ORDER — IOPAMIDOL (ISOVUE-M 200) INJECTION 41%
1.0000 mL | Freq: Once | INTRAMUSCULAR | Status: AC
  Administered 2017-07-14: 1 mL via EPIDURAL

## 2017-07-14 NOTE — Progress Notes (Signed)
CBG 99. Pt will come back tomorrow and have [name] check it again as she does not check her blood sugar at home.

## 2017-07-14 NOTE — Discharge Instructions (Signed)

## 2017-07-14 NOTE — Telephone Encounter (Signed)
I called patient. MRI of the low back shows areas of enhancement that may represent scar tissue, no evidence of recurrent disc herniation.  MRI the brain appears to be stable from 2016.  The patient had an epidural steroid injection today, hopefully this will help her back pain.   MRI brain 07/14/17:  IMPRESSION:  Abnormal MRI brain (with and without) demonstrating: 1. Stable foci of T2 hyperintensities near the right frontal horn of lateral ventricle and adjacent to the 4th ventricle.  2. No abnormal enhancing lesions. 3. No significant change compared to MRI on 09/27/15.     MRI lumbar 07/14/17:  IMPRESSION:  Abnormal MRI lumbar spine (with and without) demonstrating: 1. At L4-5: disc bulging; left posterior lamincetomy; there is left lateral recess stenosis and moderate left foraminal stenosis. 2. Also at L4-5 level, there several areas of enhancement extending in the posterior subcutaneous tissue, posterior interspinous regions, and posterior epidural region, likely representing enhancing granulation / scar tissue. There is slight inferior L4 endplate enhancement also noted which could also represent granulation tissue. Infectious discitis felt to be less likely. Correlate clinically.  3. Compared to MRI on 07/03/16, postoperative changes are new finding.

## 2017-07-17 ENCOUNTER — Other Ambulatory Visit: Payer: Self-pay | Admitting: Internal Medicine

## 2017-07-19 ENCOUNTER — Other Ambulatory Visit: Payer: Self-pay | Admitting: Internal Medicine

## 2017-08-05 ENCOUNTER — Other Ambulatory Visit: Payer: Self-pay | Admitting: Internal Medicine

## 2017-08-08 ENCOUNTER — Other Ambulatory Visit: Payer: Self-pay | Admitting: Neurology

## 2017-08-13 ENCOUNTER — Encounter (HOSPITAL_COMMUNITY): Payer: Self-pay

## 2017-08-13 ENCOUNTER — Emergency Department (HOSPITAL_COMMUNITY)
Admission: EM | Admit: 2017-08-13 | Discharge: 2017-08-13 | Disposition: A | Discharge status: Home / Self Care | Payer: Medicaid Other | Attending: Emergency Medicine | Admitting: Emergency Medicine

## 2017-08-13 DIAGNOSIS — Z79899 Other long term (current) drug therapy: Secondary | ICD-10-CM | POA: Diagnosis not present

## 2017-08-13 DIAGNOSIS — Z7982 Long term (current) use of aspirin: Secondary | ICD-10-CM | POA: Diagnosis not present

## 2017-08-13 DIAGNOSIS — M5432 Sciatica, left side: Secondary | ICD-10-CM | POA: Diagnosis not present

## 2017-08-13 DIAGNOSIS — M545 Low back pain: Secondary | ICD-10-CM | POA: Diagnosis present

## 2017-08-13 DIAGNOSIS — Z7984 Long term (current) use of oral hypoglycemic drugs: Secondary | ICD-10-CM | POA: Insufficient documentation

## 2017-08-13 DIAGNOSIS — I10 Essential (primary) hypertension: Secondary | ICD-10-CM | POA: Insufficient documentation

## 2017-08-13 DIAGNOSIS — E039 Hypothyroidism, unspecified: Secondary | ICD-10-CM | POA: Insufficient documentation

## 2017-08-13 DIAGNOSIS — E119 Type 2 diabetes mellitus without complications: Secondary | ICD-10-CM | POA: Diagnosis not present

## 2017-08-13 MED ORDER — LIDOCAINE 5 % EX PTCH
1.0000 | MEDICATED_PATCH | Freq: Once | CUTANEOUS | Status: DC
  Administered 2017-08-13: 1 via TRANSDERMAL
  Filled 2017-08-13: qty 1

## 2017-08-13 MED ORDER — HYDROCODONE-ACETAMINOPHEN 5-325 MG PO TABS: 1.0000 | ORAL_TABLET | Freq: Four times a day (QID) | ORAL | 0 refills | Status: DC | PRN

## 2017-08-13 MED ORDER — CYCLOBENZAPRINE HCL 10 MG PO TABS: 10.0000 mg | ORAL_TABLET | Freq: Two times a day (BID) | ORAL | 0 refills | Status: DC | PRN

## 2017-08-13 MED ORDER — LIDOCAINE 5 % EX PTCH: 1.0000 | MEDICATED_PATCH | CUTANEOUS | 0 refills | Status: DC

## 2017-08-13 MED ORDER — OXYCODONE-ACETAMINOPHEN 5-325 MG PO TABS
1.0000 | ORAL_TABLET | Freq: Once | ORAL | Status: AC
  Administered 2017-08-13: 1 via ORAL
  Filled 2017-08-13: qty 1

## 2017-08-13 NOTE — ED Triage Notes (Signed)
Pt complains of left lower back pain that radiates down her leg for a week, no urinary symptoms or injury Pt states she usually gets a shot and a prescription and it feels better for months

## 2017-08-13 NOTE — ED Provider Notes (Signed)
[place] DEPT Provider Note   CSN: [id number] Arrival date & time: 08/13/17  1912     History   Chief Complaint Chief Complaint  Patient presents with  . Back Pain    HPI [name] [name] is a 51 y.o. female who presents to the ED with back pain. The pain is located in the left lower back and radiates down the left leg. Symptoms started a week ago. She denies UTI symptoms. Patient reports that she had back surgery last October by Dr. Joya Salm. She reports feeling better for 5 or 6 months and then the pain gradually came back. Patient went to Dr. Jannifer Franklin, neurologist, 2 months ago and he did a scan and told her she had scar tissue. He gave her a cortisone injection but it did not help. Patient took NSAIDS without relief.   The history is provided by the patient. No language interpreter was used.  Back Pain   The current episode started more than 1 week ago. The problem occurs constantly. The problem has been gradually worsening. The pain is associated with no known injury. Pain location: left lower back. The pain radiates to the left foot. The pain is at a severity of 8/10. The symptoms are aggravated by certain positions, bending and twisting. Pertinent negatives include no fever and no dysuria. She has tried NSAIDs for the symptoms. The treatment provided no relief.    Past Medical History:  Diagnosis Date  . Anemia   . Arthritis    lumbar region- radiculopathy  . Cancer [facility]) 2013    Thyroid Ca-thyroidectomy  . Chronic fatigue   . DM (diabetes mellitus) ([place])   . Gait disturbance   . GERD (gastroesophageal reflux disease)   . Headache(784.0)   . HTN (hypertension)   . Hypothyroidism   . Nephrolithiasis   . Obesity   . OSA (obstructive sleep apnea)    last test- many yrs. ago, done at [facility], no CPAP in use  . Osteoporosis   . Peripheral neuropathy   . Prolonged QT interval   . 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 Abnormal cervical Papanicolaou smear 04/17/2017  . Throat pain 04/17/2017  . Obesity 11/24/2016  . Herniated lumbar disc without myelopathy 08/20/2016  . Healthcare maintenance 02/18/2016  . Erythema nodosum 02/01/2016  . Intractable migraine without aura 02/09/2013  . Hypothyroidism 05/08/2011  . FIBROIDS, UTERUS 11/06/2010  . HYPERLIPIDEMIA 01/28/2010  . Sciatica of left side associated with disorder of lumbar spine 10/20/2007  . 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 Diabetes mellitus type 2, controlled ([place]) 05/25/2007  . OBSTRUCTIVE SLEEP APNEA 05/25/2007  . Essential hypertension 05/25/2007  . Gastroesophageal reflux disease 05/25/2007  . History of osteoporosis 05/25/2007  . LONG QT SYNDROME 05/25/2007    Past Surgical History:  Procedure Laterality Date  . BRAIN SURGERY  2001   for sarcoidosis  . BREAST BIOPSY Left   . BREAST BIOPSY Right   . BREAST SURGERY Bilateral    biopsies   . DILATION AND CURETTAGE OF UTERUS    . HYSTEROSCOPY    . LUMBAR LAMINECTOMY/DECOMPRESSION MICRODISCECTOMY Left 08/20/2016   Procedure: Left Lumbar four-five Microdiskectomy;  Surgeon: Leeroy Cha, MD;  Location: [place] [facility];  Service: Neurosurgery;  Laterality: Left;  . PARTIAL HYSTERECTOMY  2013  . ROTATOR CUFF REPAIR Right    07/2013  . Suboccipital craniotomy    . THYROIDECTOMY  Precancerous lesion    OB History    Gravida Para Term Preterm AB Living   5 4       4    SAB TAB Ectopic Multiple Live Births                   Home Medications    Prior to Admission medications   Medication Sig Start Date End Date Taking? Authorizing Provider  aspirin 81 MG chewable tablet Chew 81 mg by mouth daily.    [provider]  atorvastatin (LIPITOR) 20 MG tablet TAKE 1 TABLET BY MOUTH EVERY EVENING 01/01/17   Rice, Resa Miner, MD  cyclobenzaprine (FLEXERIL) 10 MG tablet Take 1 tablet (10 mg total) by mouth 2 (two) times daily as needed for muscle  spasms. 08/13/17   Ashley Murrain, NP  folic acid (FOLVITE) 1 MG tablet TAKE 1 TABLET BY MOUTH EVERY DAY 08/06/17   Rice, Resa Miner, MD  HYDROcodone-acetaminophen (NORCO/VICODIN) 5-325 MG tablet Take 1 tablet by mouth every 6 (six) hours as needed. 08/13/17   Ashley Murrain, NP  levothyroxine (SYNTHROID, LEVOTHROID) 137 MCG tablet TAKE 1 TABLET BY MOUTH EVERY DAY BEFORE BREAKFAST 04/22/17   Rice, Resa Miner, MD  lidocaine (LIDODERM) 5 % Place 1 patch onto the skin daily. Remove & Discard patch within 12 hours or as directed by MD 08/13/17   Ashley Murrain, NP  losartan (COZAAR) 50 MG tablet TAKE 1 TABLET BY MOUTH EVERY DAY 09/14/16   Rice, Resa Miner, MD  metFORMIN (GLUCOPHAGE) 1000 MG tablet TAKE 1 TABLET BY MOUTH TWICE A DAY WITH A MEAL 05/11/17   Rice, Resa Miner, MD  methotrexate (RHEUMATREX) 2.5 MG tablet TAKE 4 TABLETS (10 MG TOTAL) BY MOUTH ONCE A WEEK. CAUTION:CHEMOTHERAPY. PROTECT FROM LIGHT. 08/10/17   Kathrynn Ducking, MD  Multiple Vitamin (MULTIVITAMIN WITH MINERALS) TABS tablet Take 1 tablet by mouth every evening.     [provider]  pantoprazole (PROTONIX) 20 MG tablet TAKE 1 TABLET BY MOUTH EVERY DAY 07/11/17   Rice, Resa Miner, MD  phenazopyridine (PYRIDIUM) 200 MG tablet Take 1 tablet (200 mg total) by mouth 3 (three) times daily. 05/11/17   Veryl Speak, MD  SENNA PLUS 8.6-50 MG tablet TAKE 2 TABLETS BY MOUTH TWICE A DAY 07/21/17   Rice, Resa Miner, MD    Family History Family History  Problem Relation Age of Onset  . Heart disease Mother        questionable CAD and arrythmias   . Cancer Mother        Breast cancer  . Venous thrombosis Sister   . Diabetes Sister   . Heart disease Sister   . Diabetes Brother   . Heart disease Brother   . Cancer Maternal Aunt   . Venous thrombosis Sister   . Diabetes Sister   . Heart disease Sister     Social History Social History  Substance Use Topics  . Smoking status: Never Smoker  . Smokeless tobacco: Never  Used  . Alcohol use No     Allergies   Patient has no known allergies.   Review of Systems Review of Systems  Constitutional: Negative for chills and fever.  Gastrointestinal: Negative for nausea and vomiting.  Genitourinary: Negative for dysuria, frequency and urgency.  Musculoskeletal: Positive for arthralgias and back pain.  Skin: Negative for wound.  All other systems reviewed and are negative.    Physical Exam Updated Vital Signs BP 123/77 (BP Location: Right Arm)  Pulse 96   Temp 98.7 F (37.1 C) (Oral)   Resp 18   LMP 12/03/2011   SpO2 100%   Physical Exam  Constitutional: She appears well-developed and well-nourished. No distress.  HENT:  Head: Normocephalic and atraumatic.  Nose: Nose normal.  Eyes: EOM are normal.  Neck: Normal range of motion. Neck supple.  Cardiovascular: Normal rate and regular rhythm.   Pulmonary/Chest: Effort normal. She has no wheezes. She has no rales.  Abdominal: Soft. Bowel sounds are normal. There is no tenderness.  Musculoskeletal:       Lumbar back: She exhibits tenderness, pain and spasm. She exhibits normal pulse.  Tender with palpation left lumbar area and over the left sciatic nerve.  Neurological: She is alert. She has normal strength. Gait normal.  Reflex Scores:      Bicep reflexes are 2+ on the right side and 2+ on the left side.      Brachioradialis reflexes are 2+ on the right side and 2+ on the left side.      Patellar reflexes are 2+ on the right side and 2+ on the left side. Skin: Skin is warm and dry.  Psychiatric: She has a normal mood and affect. Her behavior is normal.  Nursing note and vitals reviewed.    ED Treatments / Results  Labs (all labs ordered are listed, but only abnormal results are displayed) Labs Reviewed - No data to display Radiology No results found.  Procedures Procedures (including critical care time)  Medications Ordered in ED Medications  lidocaine (LIDODERM) 5 % 1 patch (1  patch Transdermal Patch Applied 08/13/17 2040)  oxyCODONE-acetaminophen (PERCOCET/ROXICET) 5-325 MG per tablet 1 tablet (1 tablet Oral Given 08/13/17 2039)   Patient with back pain.  No neurological deficits and normal neuro exam.  Patient can walk but states is painful.  No loss of bowel or bladder control.  No concern for cauda equina.  No fever, night sweats, weight loss, h/o cancer, IVDU.  RICE protocol and pain medicine indicated and discussed with patient. Patient to return to the neurosurgeon for follow up.  Initial Impression / Assessment and Plan / ED Course  I have reviewed the triage vital signs and the nursing notes.   Final Clinical Impressions(s) / ED Diagnoses   Final diagnoses:  Sciatica of left side    New Prescriptions New Prescriptions   CYCLOBENZAPRINE (FLEXERIL) 10 MG TABLET    Take 1 tablet (10 mg total) by mouth 2 (two) times daily as needed for muscle spasms.   HYDROCODONE-ACETAMINOPHEN (NORCO/VICODIN) 5-325 MG TABLET    Take 1 tablet by mouth every 6 (six) hours as needed.   LIDOCAINE (LIDODERM) 5 %    Place 1 patch onto the skin daily. Remove & Discard patch within 12 hours or as directed by MD     Ashley Murrain, NP 08/13/17 2154    Dorie Rank, MD 08/15/17 [phone]

## 2017-08-13 NOTE — ED Notes (Signed)
Bed: WLPT2 Expected date:  Expected time:  Means of arrival:  Comments: 

## 2017-08-13 NOTE — Discharge Instructions (Signed)
Call Dr. Harley Hallmark office in the morning to discuss follow up with one of the doctor's that took his place when he retired.   You may need to see your primary care doctor for pain management until the neurosurgeon can see you.

## 2017-08-14 ENCOUNTER — Other Ambulatory Visit: Payer: Medicaid Other

## 2017-08-19 ENCOUNTER — Other Ambulatory Visit: Payer: Self-pay | Admitting: *Deleted

## 2017-08-20 MED ORDER — LOSARTAN POTASSIUM 50 MG PO TABS: 50.0000 mg | ORAL_TABLET | Freq: Every day | ORAL | 11 refills | Status: DC

## 2017-08-21 ENCOUNTER — Encounter: Payer: Medicaid Other | Admitting: Internal Medicine

## 2017-08-21 ENCOUNTER — Encounter (INDEPENDENT_AMBULATORY_CARE_PROVIDER_SITE_OTHER): Payer: Self-pay

## 2017-08-21 ENCOUNTER — Ambulatory Visit (INDEPENDENT_AMBULATORY_CARE_PROVIDER_SITE_OTHER): Payer: Medicaid Other | Admitting: Internal Medicine

## 2017-08-21 ENCOUNTER — Encounter: Payer: Self-pay | Admitting: Internal Medicine

## 2017-08-21 VITALS — BP 129/71 | HR 87 | Temp 98.3°F | Ht 68.0 in | Wt 214.0 lb

## 2017-08-21 DIAGNOSIS — Z7984 Long term (current) use of oral hypoglycemic drugs: Secondary | ICD-10-CM | POA: Diagnosis not present

## 2017-08-21 DIAGNOSIS — Z23 Encounter for immunization: Secondary | ICD-10-CM | POA: Diagnosis present

## 2017-08-21 DIAGNOSIS — M5117 Intervertebral disc disorders with radiculopathy, lumbosacral region: Secondary | ICD-10-CM | POA: Diagnosis not present

## 2017-08-21 DIAGNOSIS — R2243 Localized swelling, mass and lump, lower limb, bilateral: Secondary | ICD-10-CM

## 2017-08-21 DIAGNOSIS — Z79899 Other long term (current) drug therapy: Secondary | ICD-10-CM

## 2017-08-21 DIAGNOSIS — E119 Type 2 diabetes mellitus without complications: Secondary | ICD-10-CM

## 2017-08-21 DIAGNOSIS — D8689 Sarcoidosis of other sites: Secondary | ICD-10-CM

## 2017-08-21 DIAGNOSIS — I10 Essential (primary) hypertension: Secondary | ICD-10-CM | POA: Diagnosis not present

## 2017-08-21 DIAGNOSIS — M5386 Other specified dorsopathies, lumbar region: Secondary | ICD-10-CM

## 2017-08-21 LAB — POCT GLYCOSYLATED HEMOGLOBIN (HGB A1C): Hemoglobin A1C: 6.6

## 2017-08-21 LAB — GLUCOSE, CAPILLARY: Glucose-Capillary: 70 mg/dL (ref 65–99)

## 2017-08-21 NOTE — Progress Notes (Signed)
   CC: Left sided back pain with radiculopathy  HPI:  [name] is a 51 y.o. woman here for follow up of her diabetes who has had worsening left sided low back pain and radiculopathy since about 6 months ago. She previously underwent laminectomy for nerve root impingement but had subsequent worsening of symptoms. Follow up with her surgeon has been complicated by him retiring.   See problem based assessment and plan below for additional details  Past Medical History:  Diagnosis Date  . Anemia   . Arthritis    lumbar region- radiculopathy  . Cancer [facility]) 2013    Thyroid Ca-thyroidectomy  . Chronic fatigue   . DM (diabetes mellitus) ([place])   . Gait disturbance   . GERD (gastroesophageal reflux disease)   . Headache(784.0)   . HTN (hypertension)   . Hypothyroidism   . Nephrolithiasis   . Obesity   . OSA (obstructive sleep apnea)    last test- many yrs. ago, done at [facility], no CPAP in use  . Osteoporosis   . Peripheral neuropathy   . Prolonged QT interval   . 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  HENT: Negative for hearing loss.   Eyes: Negative for blurred vision.  Respiratory: Negative for shortness of breath.   Cardiovascular: Negative for chest pain and leg swelling.  Gastrointestinal: Negative for diarrhea and nausea.  Genitourinary: Negative for dysuria.  Musculoskeletal: Positive for back pain and joint pain. Negative for falls and myalgias.  Skin: Positive for rash.  Neurological: Negative for sensory change.    Physical Exam: Physical Exam  Constitutional: She is well-developed, well-nourished, and in no distress.  HENT:  Mouth/Throat: Oropharynx is clear and moist. No oropharyngeal exudate.  Neck: Normal range of motion. Neck supple. No thyromegaly present.  Cardiovascular: Normal rate and regular rhythm.   Pulmonary/Chest: Effort normal  and breath sounds normal.  Abdominal: Soft. There is no tenderness.  Musculoskeletal: She exhibits no edema.  Positive straight leg raise on the left, mild paraspinal tenderness to palpation over left lower back, 5/5 strength in bilateral legs  Skin:  Small mildly tender nodule over left anterior tibia, two resolving hyperpigmented patches over right anterior tibia    Vitals:   08/21/17 1458  BP: 129/71  Pulse: 87  Temp: 98.3 F (36.8 C)  TempSrc: Oral  SpO2: 98%  Weight: 214 lb (97.1 kg)  Height: 5\' 8"  (1.727 m)     Assessment & Plan:   See Encounters Tab for problem based charting.  Patient discussed with Dr. Evette Doffing

## 2017-08-21 NOTE — Patient Instructions (Addendum)
It was a pleasure to see you today [name] [name].  Your back and leg pain is most likely related to nerve impingement in the lower back. If a steroid injection had no benefit I do not think repeating these would offer a lot of improvement.  You may be a candidate for other interventions such as surgery so I do think clinic follow up with your specialist is valuable. Regardless of whether they recommend a new intervention you should keep working on exercise and weight loss because these are the most likely to prevent further worsening of lower back disease.  Your diabetes is very well controlled. I do not think we need to make any changes today. You will need to follow up by around November for routine tests and we can see how things are going.

## 2017-08-22 ENCOUNTER — Other Ambulatory Visit: Payer: Self-pay | Admitting: Internal Medicine

## 2017-08-24 NOTE — Assessment & Plan Note (Signed)
BP Readings from Last 3 Encounters:  08/21/17 129/71  08/13/17 123/77  07/14/17 (!) 160/78    Lab Results  Component Value Date   NA 144 06/25/2017   K 4.7 06/25/2017   CREATININE 0.85 06/25/2017    Assessment: Blood pressure control:  Well controlled Progress toward BP goal:   At goal Comments: she is doing well with exercise and taking losartan 50mg  daily  Plan: Medications:  continue current medications Educational resources provided: brochure (denies need ) Self management tools provided:   Other plans: Needs complete metabolic panel at next visit

## 2017-08-24 NOTE — Assessment & Plan Note (Signed)
HPI: She continues to do very well with routine exercise and watching her diet. She has not had significant weight change since the last visit. She was worried about hyperglycemia after epidural steroid injection by her neurologist but has had no symptomatic complaints. Hgb A1c is at goal today 6.6%.  A: Well controlled non-insulin dependent type 2 diabetes  P: Continue metformin 1000mg  BID

## 2017-08-24 NOTE — Progress Notes (Signed)
Internal Medicine Clinic Attending  Case discussed with Dr. Rice at the time of the visit.  We reviewed the resident's history and exam and pertinent patient test results.  I agree with the assessment, diagnosis, and plan of care documented in the resident's note.  

## 2017-08-24 NOTE — Assessment & Plan Note (Addendum)
She is followed by Dr. Jannifer Franklin for treatment. She has had some increase in headaches recently that were treated with a course of hydrocodone. She also has small nodule changes on her shins that could be erythema nodosum which has not been a feature of her sarcoidosis in the past. However these appear to be resolving without specific intervention so we can continue monitoring on her current treatment methotrexate 10mg  weekly. She is not due to repeat lab monitoring at this time.

## 2017-08-24 NOTE — Assessment & Plan Note (Signed)
HPI: Her lower back pain continues to be very frustrating and uncomfortable. She has undergone repeat MRI showing moderate left L4-L5 foraminal stenosis that could explain her symptoms. She had almost no improvement after getting ESI. She has not had great relief with using aleve OTC. The pain is not severe enough that it has stopped her from working or exercising but bothers her every day. There is no weakness or asymmetric swelling in her legs.  A: Ongoing lower back pain with left sided radiculopathy. The symptoms are bothersome although not disabling at this time. She is doing a good job trying to maximize non-pharmacological interventions with physical activity and weight loss. She has not been very successful with weight loss. It is unclear if she would be a candidate for further surgery at this time but that would be the next step in management if symptoms do become more activity limiting.  P: Recommended continued physical exercise and weight loss NSAIDs, acetaminophen PRN for pain Recommended against pursuing additional surgical treatment unless symptoms are more limiting

## 2017-08-27 ENCOUNTER — Telehealth: Payer: Self-pay | Admitting: Neurology

## 2017-08-27 MED ORDER — ONDANSETRON HCL 4 MG PO TABS: 4.0000 mg | ORAL_TABLET | Freq: Three times a day (TID) | ORAL | 1 refills | Status: DC | PRN

## 2017-08-27 MED ORDER — DEXAMETHASONE 2 MG PO TABS: ORAL_TABLET | ORAL | 0 refills | Status: DC

## 2017-08-27 NOTE — Telephone Encounter (Signed)
Pt called in she'dhad a migraine since yesterday evening. She has taken motrin 200mg  with no help. She is wanting to know if something could be sent to [facility]. Please call to advise.

## 2017-08-27 NOTE — Telephone Encounter (Signed)
I called patient. The patient has had a headache over the last several days, I will call in a three-day course of Decadron and medication for nausea.  Eventually she may need another prescription for hydrocodone.

## 2017-09-03 ENCOUNTER — Telehealth: Payer: Self-pay | Admitting: *Deleted

## 2017-09-03 NOTE — Telephone Encounter (Signed)
Received fax notification from [facility] that SRF incomplete. I call help desk at [phone] and spoke with Ubaldo Glassing. She stated they wanted to verify we wanted free trial only. I advised per CW,MD that he only wants Free trial. She verbalized understanding and will process form for pt. She should hear from them within 10 business days regarding shipment of her medication. Nothing further needed at this time.

## 2017-09-09 ENCOUNTER — Telehealth: Payer: Self-pay | Admitting: *Deleted

## 2017-09-09 MED ORDER — ERENUMAB-AOOE 70 MG/ML ~~LOC~~ SOAJ: 140.0000 mg | SUBCUTANEOUS | 11 refills | Status: DC

## 2017-09-09 NOTE — Telephone Encounter (Signed)
[facility] rx. escribed to retail pharmacy due to [employer] hub overload and per [employer] rep's request/fim

## 2017-09-18 ENCOUNTER — Encounter: Payer: Self-pay | Admitting: Internal Medicine

## 2017-09-29 ENCOUNTER — Emergency Department (HOSPITAL_COMMUNITY)
Admission: EM | Admit: 2017-09-29 | Discharge: 2017-09-29 | Discharge status: Against Medical Advice | Payer: Medicaid Other

## 2017-09-29 NOTE — ED Notes (Signed)
Patient called x3 for triage with no answer. 

## 2017-10-16 ENCOUNTER — Ambulatory Visit (INDEPENDENT_AMBULATORY_CARE_PROVIDER_SITE_OTHER): Payer: Medicaid Other | Admitting: Internal Medicine

## 2017-10-16 ENCOUNTER — Other Ambulatory Visit: Payer: Self-pay

## 2017-10-16 ENCOUNTER — Encounter: Payer: Self-pay | Admitting: Internal Medicine

## 2017-10-16 VITALS — BP 144/71 | HR 98 | Temp 98.2°F | Wt 227.0 lb

## 2017-10-16 DIAGNOSIS — J340 Abscess, furuncle and carbuncle of nose: Secondary | ICD-10-CM

## 2017-10-16 DIAGNOSIS — Z791 Long term (current) use of non-steroidal anti-inflammatories (NSAID): Secondary | ICD-10-CM

## 2017-10-16 DIAGNOSIS — M5442 Lumbago with sciatica, left side: Secondary | ICD-10-CM | POA: Diagnosis not present

## 2017-10-16 DIAGNOSIS — M5416 Radiculopathy, lumbar region: Secondary | ICD-10-CM

## 2017-10-16 DIAGNOSIS — Z9889 Other specified postprocedural states: Secondary | ICD-10-CM | POA: Diagnosis not present

## 2017-10-16 DIAGNOSIS — D8689 Sarcoidosis of other sites: Secondary | ICD-10-CM | POA: Diagnosis not present

## 2017-10-16 DIAGNOSIS — R51 Headache: Secondary | ICD-10-CM

## 2017-10-16 DIAGNOSIS — Z79890 Hormone replacement therapy (postmenopausal): Secondary | ICD-10-CM

## 2017-10-16 DIAGNOSIS — E119 Type 2 diabetes mellitus without complications: Secondary | ICD-10-CM

## 2017-10-16 DIAGNOSIS — G8929 Other chronic pain: Secondary | ICD-10-CM | POA: Diagnosis not present

## 2017-10-16 DIAGNOSIS — G4700 Insomnia, unspecified: Secondary | ICD-10-CM

## 2017-10-16 DIAGNOSIS — R040 Epistaxis: Secondary | ICD-10-CM | POA: Diagnosis not present

## 2017-10-16 DIAGNOSIS — Z79899 Other long term (current) drug therapy: Secondary | ICD-10-CM | POA: Diagnosis not present

## 2017-10-16 DIAGNOSIS — R0789 Other chest pain: Secondary | ICD-10-CM

## 2017-10-16 DIAGNOSIS — E039 Hypothyroidism, unspecified: Secondary | ICD-10-CM

## 2017-10-16 DIAGNOSIS — M5386 Other specified dorsopathies, lumbar region: Secondary | ICD-10-CM

## 2017-10-16 NOTE — Progress Notes (Signed)
CC: chronic back pain, chest pain, nasal bleeding  HPI:  [name] [name] is a 51 y.o. female with PMHx detailed below presenting with chronic low back pain also complaining about fatigue, weight gain, and mild nose bleeding.  See problem based assessment and plan below for additional details.  Sciatica of left side associated with disorder of lumbar spine She continues to suffer from back and leg pain daily which is her biggest complaint. This does seem to be worsening her mood, physical activity level, and quality of sleep at night. She is already using NSAIDs and muscle relaxants with fairly poor relief. ESI performed at neurology clinic a few months ago were minimally helpful. She feels frustrated by this problem. She takes hydrocodone intermittently prescribed by Dr. Jannifer Franklin for her complicated headaches which does not help her back very much. 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 We will check a repeat TSH on her current dose of 137 mcg daily.  Intermittent left-sided chest pain She has episodes of sharp left sided chest pain that is worst when she lies onto her left side. These are ongoing for about one month although this problem has occurred in the past. There is no association with physical exertion or shortness of breath. There are no concerning features for cardiac involvement. This could be pleuritic chest pain or chest wall inflammation. A chest xray would be indicated given her underlying sarcoidosis. 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 Her erythematous areas on anterior shins are persistent since her last visit. She also now has a nodular appearing inflammation or ulceration in her nasal septum extending to both nares. Hear headaches are relatively stable. 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 Plan Chest xray for methotrexate toxicity monitoring, ?chest wall pain    Past Medical History:  Diagnosis Date  . Anemia   . Arthritis    lumbar region- radiculopathy  . Cancer [facility]) 2013    Thyroid Ca-thyroidectomy  . Chronic fatigue   . DM (diabetes mellitus) ([place])   . Gait disturbance   . GERD (gastroesophageal reflux disease)   . Headache(784.0)   . HTN (hypertension)   . Hypothyroidism   . Nephrolithiasis   . Obesity   . OSA (obstructive sleep apnea)    last test- many yrs. ago, done at [facility], no CPAP in use  . Osteoporosis   . Peripheral neuropathy   . Prolonged QT interval   . 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 Negative for weight loss.  HENT: Positive for nosebleeds. Negative for congestion and sore throat.   Eyes: Negative for blurred vision.  Respiratory: Negative for shortness of breath.   Cardiovascular: Positive for chest pain. Negative for leg swelling.  Gastrointestinal: Negative for diarrhea.  Genitourinary: Negative for dysuria.  Musculoskeletal: Positive for back pain. Negative for falls.  Skin: Positive for rash.  Neurological: Positive for  headaches.  Psychiatric/Behavioral: The patient has insomnia.      Physical Exam: Vitals:   10/16/17 1425  BP: (!) 144/71  Pulse: 98  Temp: 98.2 F (36.8 C)  TempSrc: Oral  Weight: 227 lb (103 kg)   GENERAL- alert, co-operative, NAD HEENT-  Round nodular ulcer present on nasal septum on both sides CARDIAC- RRR, no murmurs, rubs or gallops. RESP- CTAB, no wheezes or crackles. BACK- Low back pain with tenderness over lumbar spine and paraspinal muscles, L>R, pain with straight leg raise EXTREMITIES- pulse 2+, symmetric, no pedal edema, no callus or ulcers on feet, pain in left leg reproduced with flexion and slightly with internal rotation but on lateral aspect SKIN- indurated erythematous rash on left shin, tender to touch, not particularly raised PSYCH- Normal mood and affect, appropriate thought content and speech.    Assessment & Plan:   See encounters tab for problem based medical decision making.   Patient discussed with Dr. Angelia Mould

## 2017-10-16 NOTE — Patient Instructions (Addendum)
It was a pleasure to see you today [name] [name].  I would like to check your thyroid function tests today and make sure this is alright today. I also want to repeat a chest xray to monitor your sarcoidosis and annual monitoring on methotrexate treatment.  Your diabetes is very well controlled with A1c of 6.6% today.  I do not recommend starting long term opiate pain medicines for your lower back pain. You are already doing a lot of the right things with nonsteroidal antiinflammatory medicines like ibuprofen, nonmedical treatments such as heat and stretching. If this pain worsens to the point you cannot continue normal daily activities you may need repeat surgical evaluation.

## 2017-10-17 ENCOUNTER — Other Ambulatory Visit: Payer: Self-pay

## 2017-10-17 ENCOUNTER — Emergency Department (HOSPITAL_COMMUNITY)
Admission: EM | Admit: 2017-10-17 | Discharge: 2017-10-17 | Disposition: A | Discharge status: Home / Self Care | Payer: Medicaid Other | Attending: Emergency Medicine | Admitting: Emergency Medicine

## 2017-10-17 ENCOUNTER — Emergency Department (HOSPITAL_COMMUNITY): Payer: Medicaid Other

## 2017-10-17 ENCOUNTER — Encounter (HOSPITAL_COMMUNITY): Payer: Self-pay | Admitting: Emergency Medicine

## 2017-10-17 DIAGNOSIS — R0789 Other chest pain: Secondary | ICD-10-CM

## 2017-10-17 DIAGNOSIS — E039 Hypothyroidism, unspecified: Secondary | ICD-10-CM | POA: Insufficient documentation

## 2017-10-17 DIAGNOSIS — I10 Essential (primary) hypertension: Secondary | ICD-10-CM | POA: Insufficient documentation

## 2017-10-17 DIAGNOSIS — E119 Type 2 diabetes mellitus without complications: Secondary | ICD-10-CM | POA: Diagnosis not present

## 2017-10-17 DIAGNOSIS — Z79899 Other long term (current) drug therapy: Secondary | ICD-10-CM | POA: Insufficient documentation

## 2017-10-17 DIAGNOSIS — Z7982 Long term (current) use of aspirin: Secondary | ICD-10-CM | POA: Insufficient documentation

## 2017-10-17 DIAGNOSIS — Z7984 Long term (current) use of oral hypoglycemic drugs: Secondary | ICD-10-CM | POA: Diagnosis not present

## 2017-10-17 DIAGNOSIS — Z85850 Personal history of malignant neoplasm of thyroid: Secondary | ICD-10-CM | POA: Diagnosis not present

## 2017-10-17 LAB — BASIC METABOLIC PANEL
Anion gap: 7 (ref 5–15)
BUN: 14 mg/dL (ref 6–20)
CO2: 28 mmol/L (ref 22–32)
CREATININE: 0.7 mg/dL (ref 0.44–1.00)
Calcium: 8.8 mg/dL — ABNORMAL LOW (ref 8.9–10.3)
Chloride: 101 mmol/L (ref 101–111)
GFR calc Af Amer: >60 mL/min (ref >60)
GLUCOSE: 160 mg/dL — AB (ref 65–99)
POTASSIUM: 4.2 mmol/L (ref 3.5–5.1)
Sodium: 136 mmol/L (ref 135–145)

## 2017-10-17 LAB — CBC
HEMATOCRIT: 38.6 % (ref 36.0–46.0)
Hemoglobin: 12.4 g/dL (ref 12.0–15.0)
MCH: 28.2 pg (ref 26.0–34.0)
MCHC: 32.1 g/dL (ref 30.0–36.0)
MCV: 87.9 fL (ref 78.0–100.0)
Platelets: 339 10*3/uL (ref 150–400)
RBC: 4.39 MIL/uL (ref 3.87–5.11)
RDW: 14.4 % (ref 11.5–15.5)
WBC: 5.1 10*3/uL (ref 4.0–10.5)

## 2017-10-17 LAB — I-STAT TROPONIN, ED: Troponin i, poc: 0 ng/mL (ref 0.00–0.08)

## 2017-10-17 LAB — TSH: TSH: 4.03 u[IU]/mL (ref 0.450–4.500)

## 2017-10-17 NOTE — ED Provider Notes (Signed)
[place] EMERGENCY DEPARTMENT Provider Note   CSN: [id number] Arrival date & time: 10/17/17  1148     History   Chief Complaint Chief Complaint  Patient presents with  . Chest Pain    last week    HPI [name] [name] is a 51 y.o. female with a PMHx of thyroid cancer s/p thyroidectomy, hypothyroidism, prolonged QT, DM2, neurosarcoidosis, anemia, chronic fatigue, and other conditions listed below, who presents to the ED accidentally due to a miscommunication. Per chart review, pt saw Dr. Benjamine Mola of the internal medicine residency clinic yesterday, for complaints of chronic back pain as well as fatigue, weight gain, and mild nose bleeds; she must have mentioned chest pain as well, although the notes do not mention anything specific regarding this; per his MDM, he wanted to get thyroid function studies (TSH 4.030), as well as a CXR "to check on her sarcoidosis and annual monitoring because she's on methotrexate". It doesn't appear that he sent her to the ED for this, and it appears that these orders were done for an outpatient CXR. Pt states that she couldn't have the CXR done yesterday so she came today to have it done, and was told by the "front desk person" that she needed to come check into the ER for this CXR. She was unaware that this was actually supposed to be done through radiology rather than the ED.   Pt states that she has no ongoing complaints at this time. States that she's had intermittent L sided CP that began about 2wks ago but hasn't continued in the last few days.  Describes the pain as a 4/10 at onset however currently 0/10, intermittent sharp left-sided chest pain that is nonradiating, worse with laying on her left side, and improved with laying on her right side.  She denies any change with exertion or inspiration, she has not tried anything else for her pain however denies that she has any ongoing chest pain at this time.  She has a family history of DVT and  2 sisters, and CHF in her mom, as well as a nephew with congenital heart disease requiring a stent.  She has no family history of MI.  She mentions that in the past she has had similar chest pain before, saw Dr. Stanford Breed in December 2016 who felt this was not cardiac in nature.  She had had an echo in 2004 which was normal, and a stress echo in 2014 which was also normal, per his notes. She hasn't followed up with him since Dec 2016.   She denies diaphoresis, lightheadedness, fevers, chills, cough, ongoing CP, SOB, LE swelling, recent travel/surgery/immobilization, estrogen use, personal hx of DVT/PE, abd pain, N/V/D/C, hematuria, dysuria, myalgias, arthralgias, claudication, orthopnea, numbness, tingling, focal weakness, or any other complaints at this time. She is a non-smoker.   The history is provided by the patient and medical records. No language interpreter was used.  Chest Pain   This is a recurrent problem. The current episode started more than 1 week ago. The problem occurs rarely. The problem has been resolved. The pain is associated with rest. The pain is present in the lateral region. The pain is at a severity of 4/10. The pain is mild. The quality of the pain is described as brief and sharp. The pain does not radiate. The symptoms are aggravated by certain positions. Pertinent negatives include no abdominal pain, no claudication, no cough, no diaphoresis, no fever, no lower extremity edema, no nausea, no  numbness, no orthopnea, no shortness of breath, no vomiting and no weakness. Treatments tried: laying on R side. The treatment provided significant relief.  Her past medical history is significant for diabetes and hypertension.  Pertinent negatives for family medical history include: no early MI.  Procedure history is positive for echocardiogram and stress echo.    Past Medical History:  Diagnosis Date  . Anemia   . Arthritis    lumbar region- radiculopathy  . Cancer [facility]) 2013     Thyroid Ca-thyroidectomy  . Chronic fatigue   . DM (diabetes mellitus) ([place])   . Gait disturbance   . GERD (gastroesophageal reflux disease)   . Headache(784.0)   . HTN (hypertension)   . Hypothyroidism   . Nephrolithiasis   . Obesity   . OSA (obstructive sleep apnea)    last test- many yrs. ago, done at [facility], no CPAP in use  . Osteoporosis   . Peripheral neuropathy   . Prolonged QT interval   . 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 Abnormal cervical Papanicolaou smear 04/17/2017  . Throat pain 04/17/2017  . Obesity 11/24/2016  . Herniated lumbar disc without myelopathy 08/20/2016  . Healthcare maintenance 02/18/2016  . Erythema nodosum 02/01/2016  . Intractable migraine without aura 02/09/2013  . Hypothyroidism 05/08/2011  . FIBROIDS, UTERUS 11/06/2010  . HYPERLIPIDEMIA 01/28/2010  . Sciatica of left side associated with disorder of lumbar spine 10/20/2007  . Neurosarcoidosis ([place]) 05/25/2007  . Diabetes mellitus type 2, controlled ([place]) 05/25/2007  . OBSTRUCTIVE SLEEP APNEA 05/25/2007  . Essential hypertension 05/25/2007  . Gastroesophageal reflux disease 05/25/2007  . History of osteoporosis 05/25/2007  . LONG QT SYNDROME 05/25/2007    Past Surgical History:  Procedure Laterality Date  . BRAIN SURGERY  2001   for sarcoidosis  . BREAST BIOPSY Left   . BREAST BIOPSY Right   . BREAST SURGERY Bilateral    biopsies   . DILATION AND CURETTAGE OF UTERUS    . HYSTEROSCOPY    . Left Lumbar four-five Microdiskectomy Left 08/20/2016   Performed by Leeroy Cha, MD at [facility] NEURO ORS  . PARTIAL HYSTERECTOMY  2013  . ROTATOR CUFF REPAIR Right    07/2013  . Suboccipital craniotomy    . THYROIDECTOMY     Precancerous lesion    OB History    Gravida Para Term Preterm AB Living   5 4       4    SAB TAB Ectopic Multiple Live Births                   Home Medications    Prior to Admission  medications   Medication Sig Start Date End Date Taking? Authorizing Provider  aspirin 81 MG chewable tablet Chew 81 mg by mouth daily.    [provider]  atorvastatin (LIPITOR) 20 MG tablet TAKE 1 TABLET BY MOUTH EVERY EVENING 08/24/17   Rice, Resa Miner, MD  Erenumab-aooe (AIMOVIG 140 DOSE) 70 MG/ML SOAJ Inject 140 mg into the skin every 30 (thirty) days. 09/09/17   Kathrynn Ducking, MD  folic acid (FOLVITE) 1 MG tablet TAKE 1 TABLET BY MOUTH EVERY DAY 08/06/17   Rice, Resa Miner, MD  levothyroxine (SYNTHROID, LEVOTHROID) 137 MCG tablet TAKE 1 TABLET BY MOUTH EVERY DAY BEFORE BREAKFAST 04/22/17   Rice, Resa Miner, MD  lidocaine (LIDODERM) 5 % Place 1 patch onto the skin daily.  Remove & Discard patch within 12 hours or as directed by MD 08/13/17   Ashley Murrain, NP  losartan (COZAAR) 50 MG tablet Take 1 tablet (50 mg total) by mouth daily. 08/20/17   Collier Salina, MD  metFORMIN (GLUCOPHAGE) 1000 MG tablet TAKE 1 TABLET BY MOUTH TWICE A DAY WITH A MEAL 05/11/17   Rice, Resa Miner, MD  methotrexate (RHEUMATREX) 2.5 MG tablet TAKE 4 TABLETS (10 MG TOTAL) BY MOUTH ONCE A WEEK. CAUTION:CHEMOTHERAPY. PROTECT FROM LIGHT. 08/10/17   Kathrynn Ducking, MD  Multiple Vitamin (MULTIVITAMIN WITH MINERALS) TABS tablet Take 1 tablet by mouth every evening.     [provider]  ondansetron (ZOFRAN) 4 MG tablet Take 1 tablet (4 mg total) by mouth every 8 (eight) hours as needed for nausea or vomiting. 08/27/17   Kathrynn Ducking, MD  pantoprazole (PROTONIX) 20 MG tablet TAKE 1 TABLET BY MOUTH EVERY DAY 07/11/17   Rice, Resa Miner, MD  SENNA PLUS 8.6-50 MG tablet TAKE 2 TABLETS BY MOUTH TWICE A DAY 07/21/17   Rice, Resa Miner, MD    Family History Family History  Problem Relation Age of Onset  . Heart disease Mother        questionable CAD and arrythmias   . Cancer Mother        Breast cancer  . Venous thrombosis Sister   . Diabetes Sister   . Heart disease Sister   .  Diabetes Brother   . Heart disease Brother   . Cancer Maternal Aunt   . Venous thrombosis Sister   . Diabetes Sister   . Heart disease Sister     Social History Social History   Tobacco Use  . Smoking status: Never Smoker  . Smokeless tobacco: Never Used  Substance Use Topics  . Alcohol use: No    Alcohol/week: 0.0 oz  . Drug use: No     Allergies   Patient has no known allergies.   Review of Systems Review of Systems  Constitutional: Negative for chills, diaphoresis and fever.  Respiratory: Negative for cough and shortness of breath.   Cardiovascular: Positive for chest pain. Negative for orthopnea, claudication and leg swelling.  Gastrointestinal: Negative for abdominal pain, constipation, diarrhea, nausea and vomiting.  Genitourinary: Negative for dysuria and hematuria.  Musculoskeletal: Negative for arthralgias and myalgias.  Skin: Negative for color change.  Allergic/Immunologic: Positive for immunocompromised state (DM2).  Neurological: Negative for weakness, light-headedness and numbness.  Psychiatric/Behavioral: Negative for confusion.   All other systems reviewed and are negative for acute change except as noted in the HPI.    Physical Exam Updated Vital Signs BP (!) 148/93 (BP Location: Right Arm)   Pulse 86   Temp 98.1 F (36.7 C)   Resp 16   Ht 5\' 8"  (1.727 m)   Wt 98 kg (216 lb)   LMP 12/03/2011   SpO2 99%   BMI 32.84 kg/m   Physical Exam  Constitutional: She is oriented to person, place, and time. Vital signs are normal. She appears well-developed and well-nourished.  Non-toxic appearance. No distress.  Afebrile, nontoxic, NAD  HENT:  Head: Normocephalic and atraumatic.  Mouth/Throat: Oropharynx is clear and moist and mucous membranes are normal.  Eyes: Conjunctivae and EOM are normal. Right eye exhibits no discharge. Left eye exhibits no discharge.  Neck: Normal range of motion. Neck supple.  Cardiovascular: Normal rate, regular rhythm,  normal heart sounds and intact distal pulses. Exam reveals no gallop and no friction rub.  No murmur heard. RRR, nl s1/s2, no m/r/g, distal pulses intact, no pedal edema   Pulmonary/Chest: Effort normal and breath sounds normal. No respiratory distress. She has no decreased breath sounds. She has no wheezes. She has no rhonchi. She has no rales. She exhibits no tenderness, no crepitus, no deformity and no retraction.  CTAB in all lung fields, no w/r/r, no hypoxia or increased WOB, speaking in full sentences, SpO2 100% on RA Chest wall nonTTP without crepitus, deformities, or retractions   Abdominal: Soft. Normal appearance and bowel sounds are normal. She exhibits no distension. There is no tenderness. There is no rigidity, no rebound, no guarding, no CVA tenderness, no tenderness at McBurney's point and negative Murphy's sign.  Musculoskeletal: Normal range of motion.  MAE x4 Strength and sensation grossly intact in all extremities Distal pulses intact Gait steady No pedal edema, neg homan's bilaterally   Neurological: She is alert and oriented to person, place, and time. She has normal strength. No sensory deficit.  Skin: Skin is warm, dry and intact. No rash noted.  Psychiatric: She has a normal mood and affect.  Nursing note and vitals reviewed.    ED Treatments / Results  Labs (all labs ordered are listed, but only abnormal results are displayed) Labs Reviewed  BASIC METABOLIC PANEL - Abnormal; Notable for the following components:      Result Value   Glucose, Bld 160 (*)    Calcium 8.8 (*)    All other components within normal limits  CBC  I-STAT TROPONIN, ED    EKG  EKG Interpretation  Date/Time:  Saturday October 17 2017 12:11:51 EST Ventricular Rate:  90 PR Interval:  120 QRS Duration: 86 QT Interval:  366 QTC Calculation: 447 R Axis:   144 Text Interpretation:  Normal sinus rhythm Right axis deviation Non-specific ST-t changes Confirmed by Lajean Saver [phone])  on 10/17/2017 2:33:45 PM       Radiology Dg Chest 2 View  Result Date: 10/17/2017 CLINICAL DATA:  Left side chest pain, shortness of Breath EXAM: CHEST  2 VIEW COMPARISON:  06/08/2016 FINDINGS: Heart and mediastinal contours are within normal limits. No focal opacities or effusions. No acute bony abnormality. IMPRESSION: No active cardiopulmonary disease. Electronically Signed   By: Rolm Baptise M.D.   On: 10/17/2017 12:39    Per Dr. Jacalyn Lefevre notes 11/06/15, results of Echo, Myoview, and stress Echo:  -Echocardiogram July 2005 showed normal LV function.  -Myoview May 2012, At that time, she was found to have an ejection fraction of 49% with a small apical defect most likely from soft tissue attenuation. Small prior infarct could not be excluded.  -Stress echocardiogram April 2014 normal   Procedures Procedures (including critical care time)  Medications Ordered in ED Medications - No data to display   Initial Impression / Assessment and Plan / ED Course  I have reviewed the triage vital signs and the nursing notes.  Pertinent labs & imaging results that were available during my care of the patient were reviewed by me and considered in my medical decision making (see chart for details).     51 y.o. female here due to miscommunication; pt was seen by PCP yesterday and CXR was ordered because she was c/o intermittent positional nonexertional L sided CP and they wanted to evaluate her sarcoidosis as well as have annual CXR for monitoring because she's on MTX. She was incorrectly sent here for the CXR even though she had an outpatient CXR ordered. She has no  complaints at this time, states the CP is intermittent and hasn't been going on for a few days at least; has had similar pain in the past and saw Dr. Stanford Breed 66yrs ago who felt it was noncardiac. On exam, no tachycardia or hypoxia, no pedal edema, no chest wall tenderness, clear lung exam, well appearing in NAD. CXR today neg, EKG  nonischemic and similar to prior EKGs in the past; trop neg; CBC WNL; BMP with mildly elevated gluc 160 but otherwise unremarkable. Doubt need for further emergent work up at this time, advised use of tylenol/motrin/heat, and f/up with PCP in 1wk outpatient for ongoing management/evaluation and recheck. I explained the diagnosis and have given explicit precautions to return to the ER including for any other new or worsening symptoms. The patient understands and accepts the medical plan as it's been dictated and I have answered their questions. Discharge instructions concerning home care and prescriptions have been given. The patient is STABLE and is discharged to home in good condition.    Final Clinical Impressions(s) / ED Diagnoses   Final diagnoses:  Intermittent left-sided chest pain    ED Discharge Orders    [address] [place], [place] 10/17/17 1524    Lajean Saver, MD 10/18/17 [phone]

## 2017-10-17 NOTE — ED Notes (Signed)
Pt currently taking chemo; placed in A10 while she waits for a room

## 2017-10-17 NOTE — ED Triage Notes (Signed)
Pt. Stated, I went to see dr, rice from Out pt. And he said I need to have a scan and EKg. No symptoms today. I did have some chest pain last week.

## 2017-10-17 NOTE — Discharge Instructions (Signed)
Your labs, xray, and EKG are all reassuring, your chest pain could be a variety of things including gas pain, muscle pain, and other nonemergent issues. Alternate between tylenol and ibuprofen as directed as needed for pain. Stay well hydrated. You may consider using heat to the areas of pain, no more than 20 minutes every hour. Follow up with your regular doctor in 1 week for recheck of symptoms. Return to the ER for changes or worsening symptoms.  SEEK IMMEDIATE MEDICAL ATTENTION IF: You develop a fever.  Your chest pains become severe or intolerable.  You develop new, unexplained symptoms (problems).  You develop shortness of breath, nausea, vomiting, sweating or feel light headed.  You develop a new cough or you cough up blood. You develop new leg swelling

## 2017-10-19 NOTE — Assessment & Plan Note (Signed)
Headaches remain her primary symptom. Her erythematous areas on anterior shins are persistent since her last visit. She also now has a nodular appearing inflammation or ulceration in her nasal septum extending to both nares. Hear headaches are relatively stable. 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 Plan Chest xray for methotrexate toxicity monitoring, ?chest wall pain

## 2017-10-19 NOTE — Assessment & Plan Note (Signed)
She continues to suffer from back and leg pain daily which is her biggest complaint. This does seem to be worsening her mood, physical activity level, and quality of sleep at night. She is already using NSAIDs and muscle relaxants with fairly poor relief. ESI performed at neurology clinic a few months ago were minimally helpful. She feels frustrated by this problem. She takes hydrocodone intermittently prescribed by Dr. Jannifer Franklin for her complicated headaches which does not help her back very much. 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

## 2017-10-19 NOTE — Assessment & Plan Note (Addendum)
She continues to feel tired and having rpoblems with weight gain despite what sounds like significant physical activity 3+ days per week. We will check a repeat TSH on her current dose of 137 mcg daily.

## 2017-10-19 NOTE — Assessment & Plan Note (Signed)
She has episodes of sharp left sided chest pain that is worst when she lies onto her left side. These are ongoing for about one month although this problem has occurred in the past. There is no association with physical exertion or shortness of breath. There are no concerning features for cardiac involvement. This could be pleuritic chest pain or chest wall inflammation. A chest xray would be indicated given her underlying sarcoidosis. Plan CXR ordered today

## 2017-10-20 NOTE — Progress Notes (Signed)
Internal Medicine Clinic Attending  Case discussed with Dr. Rice at the time of the visit.  We reviewed the resident's history and exam and pertinent patient test results.  I agree with the assessment, diagnosis, and plan of care documented in the resident's note.  

## 2017-10-28 ENCOUNTER — Other Ambulatory Visit: Payer: Self-pay | Admitting: Internal Medicine

## 2017-11-06 ENCOUNTER — Other Ambulatory Visit: Payer: Self-pay | Admitting: Internal Medicine

## 2017-11-20 ENCOUNTER — Encounter: Payer: Self-pay | Admitting: *Deleted

## 2017-12-16 ENCOUNTER — Other Ambulatory Visit: Payer: Self-pay

## 2017-12-16 ENCOUNTER — Other Ambulatory Visit: Payer: Self-pay | Admitting: Gastroenterology

## 2017-12-16 ENCOUNTER — Ambulatory Visit: Payer: Medicaid Other | Admitting: Internal Medicine

## 2017-12-16 VITALS — BP 132/86 | HR 92 | Temp 97.9°F | Ht 68.0 in | Wt 226.1 lb

## 2017-12-16 DIAGNOSIS — R109 Unspecified abdominal pain: Secondary | ICD-10-CM | POA: Diagnosis not present

## 2017-12-16 DIAGNOSIS — M7989 Other specified soft tissue disorders: Secondary | ICD-10-CM | POA: Insufficient documentation

## 2017-12-16 DIAGNOSIS — G4700 Insomnia, unspecified: Secondary | ICD-10-CM | POA: Diagnosis not present

## 2017-12-16 DIAGNOSIS — G8929 Other chronic pain: Secondary | ICD-10-CM | POA: Diagnosis not present

## 2017-12-16 DIAGNOSIS — M25572 Pain in left ankle and joints of left foot: Secondary | ICD-10-CM

## 2017-12-16 DIAGNOSIS — M79662 Pain in left lower leg: Secondary | ICD-10-CM

## 2017-12-16 DIAGNOSIS — R1084 Generalized abdominal pain: Secondary | ICD-10-CM

## 2017-12-16 DIAGNOSIS — M25571 Pain in right ankle and joints of right foot: Secondary | ICD-10-CM

## 2017-12-16 HISTORY — DX: Other specified soft tissue disorders: M79.89

## 2017-12-16 HISTORY — DX: Other specified soft tissue disorders: M79.662

## 2017-12-16 NOTE — Patient Instructions (Addendum)
Nice to meet you [name] [name].  For your ankle pain, we will send you to podiatry for further evaluation and recommendations as there does not seem to be an obvious reason that you would be have this ankle pain.  In the meantime we are recommending continuing ibuprofen (800 mg up to 3 times a day) and Tylenol for pain (1000 mg up to 4 times a day), and you can also try compression stockings or the Ace bandage wrap to keep fluid down and help with the pain.  For your sleep, he can increase to 10 mg of melatonin (two 5 mg over the counter tablets) and take it 2-3 hours before bedtime instead of just the 1 mg.  Below are also some recommendations to help avoid things that can interfere with sleep.  Also noted several years ago it was documented that he may have sleep apnea.  If this is the case it may be something that needs to be reevaluated.  We will put in for you to have an appointment with Dr. Benjamine Mola in the near future for Pap smear and to follow-up on the results of the scan at Dr. Benson Norway ordered today to take care of any issues this uncovers and see how you are doing.  Practice Good Sleep Hygiene Here are some suggestions Avoid napping during the day. It can disturb the normal pattern of sleep and wakefulness.  Avoid stimulants such as caffeine, nicotine, and alcohol too close to bedtime. While alcohol is well known to speed the onset of sleep, it disrupts sleep in the second half as the body begins to metabolize the alcohol, causing arousal.  Exercise can promote good sleep. Vigorous exercise should be taken in the morning or late afternoon. A relaxing exercise, like yoga, can be done before bed to help initiate a restful night's sleep. Food can be disruptive right before sleep. Stay away from large meals close to bedtime. Also dietary changes can cause sleep problems, if someone is struggling with a sleep problem, it's not a good time to start experimenting with spicy dishes. And, remember, chocolate has  caffeine.  Ensure adequate exposure to natural light. This is particularly important for older people who may not venture outside as frequently as children and adults. Light exposure helps maintain a healthy sleep-wake cycle.  Establish a regular relaxing bedtime routine. Try to avoid emotionally upsetting conversations and activities before trying to go to sleep. Don't dwell on, or bring your problems to bed.  Associate your bed with sleep. It's not a good idea to use your bed to watch TV, listen to the radio, or read.  Make sure that the sleep environment is pleasant and relaxing. The bed should be comfortable, the room should not be too hot or cold, or too bright.

## 2017-12-17 NOTE — Progress Notes (Signed)
   CC: Ankle pain   HPI:  [name] [name] is a 52 y.o. female with past medical history as described below presents to the clinic with ankle pain and multiple other complaints.  Ankle Pain: She has a several week history of ankle pain which fluctuates in intensity and is associated with swelling.  She denies any fall or injury as an inciting incident.  States it sometimes improves with an Ace wrap, otherwise no modifying factors.  She feels her left ankle is more swollen and more painful than her right.  She is still able to walk and this has not limited her function.  Sleep difficulty: Patient also notes the with sleep and feels that she sleeps a very brief period before waking up and lying awake until early morning hours.  She states she has tried 1 mg melatonin that she is on over-the-counter.  She is previously on Ambien.  Chronic abdominal and pelvic pain: She also complains of chronic abdominal and pelvic issues.  Earlier today she was seen by GI who she reports ordered imaging.  She also requests a OB/GYN referral for chronic pelvic issues.  She denies any real changes from when she was previously evaluated by GYN specialist in the past.  Past Medical History:  Diagnosis Date  . Anemia   . Arthritis    lumbar region- radiculopathy  . Cancer [facility]) 2013    Thyroid Ca-thyroidectomy  . Chronic fatigue   . DM (diabetes mellitus) ([place])   . Gait disturbance   . GERD (gastroesophageal reflux disease)   . Headache(784.0)   . HTN (hypertension)   . Hypothyroidism   . Nephrolithiasis   . Obesity   . OSA (obstructive sleep apnea)    last test- many yrs. ago, done at [facility], no CPAP in use  . Osteoporosis   . Peripheral neuropathy   . Prolonged QT interval   . 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  Gastrointestinal: Positive for abdominal pain.  Musculoskeletal: Positive for joint  pain. Negative for falls.  Psychiatric/Behavioral: The patient has insomnia.      Physical Exam:  Vitals:   12/16/17 1431  BP: 132/86  Pulse: 92  Temp: 97.9 F (36.6 C)  TempSrc: Oral  SpO2: 99%  Weight: 226 lb 1.6 oz (102.6 kg)  Height: 5\' 8"  (1.727 m)   General: Sitting in chair comfortably, no acute distress HEENT: Moist mucous membranes, normal conjunctiva CV: Regular rate and rhythm, no murmur appreciated Resp: Clear breath sounds bilaterally, normal work of breathing, no distress  Abd: Soft, +BS, no signficant tenderness to palpation  Extr: Small amount of edema overlying the medial and lateral malleoli on bilateral ankles with left slightly greater than right, no bony tenderness to left medial or lateral malleolus or base of fifth metatarsal.  No overlying erythema or warmth, no pedal edema.  Normal gait. Neuro: Alert and oriented x3 Skin: Warm, dry      Assessment & Plan:   See Encounters Tab for problem based charting.  Patient discussed with Dr. Angelia Mould

## 2017-12-17 NOTE — Assessment & Plan Note (Signed)
Patient reporting ankle pain left greater than right with intermittent swelling.  Does have small amount of nonpitting edema surrounding her malleoli bilaterally with no significant tenderness, full range of motion and strength. No generalized edema concerning for non-MSK etiologies.  Encouraged to elevate legs and use compression stocking or Ace wrap given prior relief.  Will refer to podiatry for more detailed evaluation is no obvious abnormalities identified. Continue conservative pain control with NSAID.   --Podiatry referral

## 2017-12-17 NOTE — Assessment & Plan Note (Signed)
Patient also notes generalized and vague abdominal and pelvic symptoms and requests referral to OB/GYN.  She has seen her gastroenterologist this morning who ordered an upper GI series.  We will follow-up these results and she was informed that we can reassess to see if this has uncovered abnormalities before placing additional referrals.

## 2017-12-17 NOTE — Assessment & Plan Note (Addendum)
She is complaining of worsening of her sleep difficulty which has been a chronic problem, previously on Ambien.  She is already taking Ambien and we would like to try more conservative measures prior to resorting back to this medication.  She is instructed that she could increase melatonin to 5 or 10 mg taken 2-3 hours before bed and was provided with sleep hygiene instructions.  Will reassess on follow-up

## 2017-12-23 NOTE — Progress Notes (Signed)
Internal Medicine Clinic Attending  Case discussed with Dr. Harden at the time of the visit.  We reviewed the resident's history and exam and pertinent patient test results.  I agree with the assessment, diagnosis, and plan of care documented in the resident's note.  

## 2017-12-25 ENCOUNTER — Other Ambulatory Visit: Payer: Self-pay | Admitting: Gastroenterology

## 2017-12-25 ENCOUNTER — Ambulatory Visit
Admission: RE | Admit: 2017-12-25 | Discharge: 2017-12-25 | Disposition: A | Discharge status: Home / Self Care | Payer: Medicaid Other | Source: Ambulatory Visit | Attending: Gastroenterology | Admitting: Gastroenterology

## 2017-12-25 DIAGNOSIS — R1084 Generalized abdominal pain: Secondary | ICD-10-CM

## 2017-12-31 ENCOUNTER — Other Ambulatory Visit: Payer: Self-pay | Admitting: Internal Medicine

## 2017-12-31 ENCOUNTER — Ambulatory Visit: Payer: Medicaid Other | Admitting: Neurology

## 2017-12-31 ENCOUNTER — Encounter: Payer: Self-pay | Admitting: Neurology

## 2017-12-31 VITALS — BP 154/87 | HR 91 | Ht 68.0 in | Wt 229.5 lb

## 2017-12-31 DIAGNOSIS — D8689 Sarcoidosis of other sites: Secondary | ICD-10-CM | POA: Diagnosis not present

## 2017-12-31 DIAGNOSIS — G43019 Migraine without aura, intractable, without status migrainosus: Secondary | ICD-10-CM

## 2017-12-31 DIAGNOSIS — M5386 Other specified dorsopathies, lumbar region: Secondary | ICD-10-CM | POA: Diagnosis not present

## 2017-12-31 MED ORDER — PREGABALIN 50 MG PO CAPS
ORAL_CAPSULE | ORAL | 5 refills | Status: DC
Start: 2017-12-31 — End: 2018-01-05

## 2017-12-31 MED ORDER — HYDROCODONE-ACETAMINOPHEN 5-325 MG PO TABS: 1.0000 | ORAL_TABLET | Freq: Four times a day (QID) | ORAL | 0 refills | Status: DC | PRN

## 2017-12-31 NOTE — Patient Instructions (Signed)
   We will start Lyrica 50 mg capsule twice a day for 2 weeks, then take one in the morning and 2 in the evening.

## 2017-12-31 NOTE — Progress Notes (Signed)
Reason for visit: Headache, neurosarcoidosis, back pain  [name] [name] is an 52 y.o. female  History of present illness:  [name] [name] is a 52 year old right-handed black female with a history of neurosarcoidosis.  The patient is on methotrexate.  The patient continues to have headaches associated with her migraine, she has 3 or 4 headaches a week.  The patient has low back pain following back surgery, she continues to have pain down the left leg.  MRI of the lumbar spine showed granulation tissue, no definite ongoing progression of nerve roots.  The patient was given an epidural steroid injection which offered no benefit.  She has been on no medication for pain.  She returns the office today for an evaluation.  Past Medical History:  Diagnosis Date  . Anemia   . Arthritis    lumbar region- radiculopathy  . Cancer [facility]) 2013    Thyroid Ca-thyroidectomy  . Chronic fatigue   . DM (diabetes mellitus) ([place])   . Gait disturbance   . GERD (gastroesophageal reflux disease)   . Headache(784.0)   . HTN (hypertension)   . Hypothyroidism   . Nephrolithiasis   . Obesity   . OSA (obstructive sleep apnea)    last test- many yrs. ago, done at [facility], no CPAP in use  . Osteoporosis   . Peripheral neuropathy   . Prolonged QT interval   . 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 BRAIN SURGERY  2001   for sarcoidosis  . BREAST BIOPSY Left   . BREAST BIOPSY Right   . BREAST SURGERY Bilateral    biopsies   . DILATION AND CURETTAGE OF UTERUS    . HYSTEROSCOPY    . LUMBAR LAMINECTOMY/DECOMPRESSION MICRODISCECTOMY Left 08/20/2016   Procedure: Left Lumbar four-five Microdiskectomy;  Surgeon: Leeroy Cha, MD;  Location: [place] [facility];  Service: Neurosurgery;  Laterality: Left;  . PARTIAL HYSTERECTOMY  2013  . ROTATOR CUFF REPAIR Right    07/2013  . Suboccipital craniotomy    . THYROIDECTOMY     Precancerous lesion     Family History  Problem Relation Age of Onset  . Heart disease Mother        questionable CAD and arrythmias   . Cancer Mother        Breast cancer  . Venous thrombosis Sister   . Diabetes Sister   . Heart disease Sister   . Diabetes Brother   . Heart disease Brother   . Cancer Maternal Aunt   . Venous thrombosis Sister   . Diabetes Sister   . Heart disease Sister     Social history:  reports that  has never smoked. she has never used smokeless tobacco. She reports that she does not drink alcohol or use drugs.   No Known Allergies  Medications:  Prior to Admission medications   Medication Sig Start Date End Date Taking? Authorizing Provider  aspirin 81 MG chewable tablet Chew 81 mg by mouth daily.   Yes [provider]  atorvastatin (LIPITOR) 20 MG tablet TAKE 1 TABLET BY MOUTH EVERY EVENING Patient taking differently: TAKE 1 TABLET (20MG )  BY MOUTH EVERY EVENING 08/24/17  Yes Rice, Resa Miner, MD  [facility] SENNA PLUS 8.6-50 MG tablet TAKE 2 TABLETS BY MOUTH TWICE A DAY 10/29/17  Yes Rice, Resa Miner, MD  folic acid (FOLVITE) 1 MG tablet TAKE 1 TABLET BY MOUTH EVERY DAY Patient  taking differently: TAKE 1 TABLET (1MG ) BY MOUTH EVERY DAY 08/06/17  Yes Rice, Resa Miner, MD  ibuprofen (ADVIL,MOTRIN) 200 MG tablet Take 400 mg every 6 (six) hours as needed by mouth for headache.   Yes [provider]  levothyroxine (SYNTHROID, LEVOTHROID) 137 MCG tablet TAKE 1 TABLET BY MOUTH EVERY DAY BEFORE BREAKFAST Patient taking differently: TAKE 1 TABLET (137 MCG) BY MOUTH EVERY DAY BEFORE BREAKFAST 04/22/17  Yes Rice, Resa Miner, MD  losartan (COZAAR) 50 MG tablet Take 1 tablet (50 mg total) by mouth daily. 08/20/17  Yes Rice, Resa Miner, MD  metFORMIN (GLUCOPHAGE) 1000 MG tablet TAKE 1 TABLET BY MOUTH TWICE A DAY WITH A MEAL 11/09/17  Yes Rice, Resa Miner, MD  methotrexate (RHEUMATREX) 2.5 MG tablet TAKE 4 TABLETS (10 MG TOTAL) BY MOUTH ONCE A WEEK.  CAUTION:CHEMOTHERAPY. PROTECT FROM LIGHT. 08/10/17  Yes Kathrynn Ducking, MD  Multiple Vitamin (MULTIVITAMIN WITH MINERALS) TABS tablet Take 1 tablet by mouth every evening.    Yes [provider]  ondansetron (ZOFRAN) 4 MG tablet Take 1 tablet (4 mg total) by mouth every 8 (eight) hours as needed for nausea or vomiting. 08/27/17  Yes Kathrynn Ducking, MD  pantoprazole (PROTONIX) 20 MG tablet TAKE 1 TABLET BY MOUTH EVERY DAY Patient taking differently: TAKE 1 TABLET (20MG ) BY MOUTH EVERY DAY 07/11/17  Yes Rice, Resa Miner, MD  HYDROcodone-acetaminophen (NORCO/VICODIN) 5-325 MG tablet Take 1 tablet by mouth every 6 (six) hours as needed for moderate pain. 12/31/17   Kathrynn Ducking, MD  pregabalin (LYRICA) 50 MG capsule One capsule in the morning and 2 in the evening 12/31/17   Kathrynn Ducking, MD    ROS:  Out of a complete 14 system review of symptoms, the patient complains only of the following symptoms, and all other reviewed systems are negative.  Fatigue Runny nose Cold intolerance Swollen abdomen, abdominal pain, constipation, nausea Frequent waking, daytime sleepiness Back pain Headache  Blood pressure (!) 154/87, pulse 91, height 5\' 8"  (1.727 m), weight 229 lb 8 oz (104.1 kg), last menstrual period 12/03/2011.  Physical Exam  General: The patient is alert and cooperative at the time of the examination.  Patient is moderately obese.  Skin: No significant peripheral edema is noted.   Neurologic Exam  Mental status: The patient is alert and oriented x 3 at the time of the examination. The patient has apparent normal recent and remote memory, with an apparently normal attention span and concentration ability.   Cranial nerves: Facial symmetry is present. Speech is normal, no aphasia or dysarthria is noted. Extraocular movements are full. Visual fields are full.  Motor: The patient has good strength in all 4 extremities.  Sensory examination: Soft touch sensation  is symmetric on the face, arms, and legs.  Coordination: The patient has good finger-nose-finger and heel-to-shin bilaterally.  Gait and station: The patient has a normal gait. Tandem gait is slightly unsteady. Romberg is negative. No drift is seen.  Reflexes: Deep tendon reflexes are symmetric.   Assessment/Plan:  1.  Intractable migraine headache  2.  Neurosarcoidosis  3.  Chronic low back pain, left leg pain  The patient was placed on Lyrica taking 50 mg twice daily for 2 weeks and then take 50 mg in the morning 100 mg in the evening.  The patient will be given a small prescription for hydrocodone.  She will follow-up in 6 months.  She will call for any dose adjustments of the Lyrica.  C.  Floyde Parkins MD 12/31/2017 3:26 PM  [facility] [address] [place], [place] [address]  Phone [phone] Fax [phone]

## 2018-01-01 ENCOUNTER — Encounter: Payer: Self-pay | Admitting: Neurology

## 2018-01-01 NOTE — Telephone Encounter (Signed)
Next appt scheduled  01/29/18 with PCP. 

## 2018-01-04 ENCOUNTER — Telehealth: Payer: Self-pay | Admitting: *Deleted

## 2018-01-04 NOTE — Telephone Encounter (Signed)
Submitted PA via fax re: hydrocodone-acetaminophen 5-325mg  to [facility]. Fax: [phone]. Received fax confirmation. Waiting on determination.

## 2018-01-04 NOTE — Telephone Encounter (Signed)
Called [facility], spoke with Butch Penny. Initiated and submitted PA Lyrica 50mg  cap over the phone. Patient has tried failed: gabapentin, hydrocodone, diclofenac, naproxen, ibuprofen, percocet, tramadol. Did not see where patient has tried/failed duloxetine. This was a preferred medication. Did mention patient has been on lyrica in past with benefit.  PA#: [id number]. Interaction ID# for call: [id number]. Waiting on determination. Should have response in 24 hours. Have to call back to check on status.

## 2018-01-05 MED ORDER — DULOXETINE HCL 30 MG PO CPEP: ORAL_CAPSULE | ORAL | 3 refills | Status: DC

## 2018-01-05 NOTE — Telephone Encounter (Signed)
Checked on status of PA. It was approved effective 01/04/18-07/03/18. ZO#[id number].

## 2018-01-05 NOTE — Telephone Encounter (Signed)
Called [facility] back to check on status of PA. Spoke with Tameka. PA denied d/t patient not having tried/failed duloxetine. They sent out denial letter to patient/proivder office yesterday.  Interaction ID # for call: [id number].

## 2018-01-05 NOTE — Addendum Note (Signed)
Addended by: Kathrynn Ducking on: 01/05/2018 01:25 PM   Modules accepted: Orders

## 2018-01-05 NOTE — Telephone Encounter (Signed)
I called the patient.  The insurance company denied the Lyrica indicating that Cymbalta needs to be tried first.  I will call in a low-dose of Cymbalta for the back pain.  I discussed this with the patient.

## 2018-01-15 ENCOUNTER — Encounter: Payer: Medicaid Other | Admitting: Internal Medicine

## 2018-01-19 ENCOUNTER — Encounter: Payer: Self-pay | Admitting: Podiatry

## 2018-01-19 ENCOUNTER — Ambulatory Visit: Payer: Medicaid Other | Admitting: Podiatry

## 2018-01-19 ENCOUNTER — Other Ambulatory Visit: Payer: Self-pay | Admitting: Podiatry

## 2018-01-19 ENCOUNTER — Ambulatory Visit (INDEPENDENT_AMBULATORY_CARE_PROVIDER_SITE_OTHER): Payer: Medicaid Other

## 2018-01-19 VITALS — BP 108/67 | HR 103 | Resp 16

## 2018-01-19 DIAGNOSIS — M76822 Posterior tibial tendinitis, left leg: Secondary | ICD-10-CM

## 2018-01-19 DIAGNOSIS — M7750 Other enthesopathy of unspecified foot: Secondary | ICD-10-CM

## 2018-01-19 DIAGNOSIS — M7752 Other enthesopathy of left foot: Secondary | ICD-10-CM | POA: Diagnosis not present

## 2018-01-19 NOTE — Progress Notes (Signed)
Subjective:  Patient ID: [name] [name], female    DOB: [birth year],  MRN: [id number] HPI Chief Complaint  Patient presents with  . Ankle Pain    Medial ankle left - burning x 1 year, getting more intense in the last few months, pain is constant, swollen, discolored at time, no injury, tried wrapping, does have back issues, takes pain meds for back - not really helped foot    52 y.o. female presents with the above complaint.   She states that this is been going on for a year now it seems to be getting worse she is tried offloading it crutches Cam walker pain medication.  Does relate a back injury but her previous doctors do not feel that is associated with this foot.  She states that nothing is really helped this at all and is getting to the point where she can hardly bear weight on it at all.   Past Medical History:  Diagnosis Date  . Anemia   . Arthritis    lumbar region- radiculopathy  . Cancer [facility]) 2013    Thyroid Ca-thyroidectomy  . Chronic fatigue   . DM (diabetes mellitus) ([place])   . Gait disturbance   . GERD (gastroesophageal reflux disease)   . Headache(784.0)   . HTN (hypertension)   . Hypothyroidism   . Nephrolithiasis   . Obesity   . OSA (obstructive sleep apnea)    last test- many yrs. ago, done at [facility], no CPAP in use  . Osteoporosis   . Peripheral neuropathy   . Prolonged QT interval   . 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 BRAIN SURGERY  2001   for sarcoidosis  . BREAST BIOPSY Left   . BREAST BIOPSY Right   . BREAST SURGERY Bilateral    biopsies   . DILATION AND CURETTAGE OF UTERUS    . HYSTEROSCOPY    . LUMBAR LAMINECTOMY/DECOMPRESSION MICRODISCECTOMY Left 08/20/2016   Procedure: Left Lumbar four-five Microdiskectomy;  Surgeon: Leeroy Cha, MD;  Location: [place] [facility];  Service: Neurosurgery;  Laterality: Left;  . PARTIAL HYSTERECTOMY  2013  . ROTATOR CUFF REPAIR Right      07/2013  . Suboccipital craniotomy    . THYROIDECTOMY     Precancerous lesion    Current Outpatient Medications:  .  aspirin 81 MG chewable tablet, Chew 81 mg by mouth daily., Disp: , Rfl:  .  atorvastatin (LIPITOR) 20 MG tablet, TAKE 1 TABLET BY MOUTH EVERY EVENING (Patient taking differently: TAKE 1 TABLET (20MG )  BY MOUTH EVERY EVENING), Disp: 31 tablet, Rfl: 6 .  [facility] SENNA PLUS 8.6-50 MG tablet, TAKE 2 TABLETS BY MOUTH TWICE A DAY, Disp: 60 tablet, Rfl: 2 .  DULoxetine (CYMBALTA) 30 MG capsule, 1 capsule daily for 2 weeks then take 1 twice daily, Disp: 60 capsule, Rfl: 3 .  folic acid (FOLVITE) 1 MG tablet, TAKE 1 TABLET BY MOUTH EVERY DAY (Patient taking differently: TAKE 1 TABLET (1MG ) BY MOUTH EVERY DAY), Disp: 100 tablet, Rfl: 2 .  GAVILYTE-G 236 g solution, See admin instructions., Disp: , Rfl: 0 .  HYDROcodone-acetaminophen (NORCO/VICODIN) 5-325 MG tablet, Take 1 tablet by mouth every 6 (six) hours as needed for moderate pain., Disp: 60 tablet, Rfl: 0 .  ibuprofen (ADVIL,MOTRIN) 200 MG tablet, Take 400 mg every 6 (six) hours as needed by mouth for headache., Disp: , Rfl:  .  levothyroxine (SYNTHROID, LEVOTHROID) 137 MCG  tablet, TAKE 1 TABLET BY MOUTH EVERY DAY BEFORE BREAKFAST (Patient taking differently: TAKE 1 TABLET (137 MCG) BY MOUTH EVERY DAY BEFORE BREAKFAST), Disp: 90 tablet, Rfl: 3 .  losartan (COZAAR) 50 MG tablet, Take 1 tablet (50 mg total) by mouth daily., Disp: 30 tablet, Rfl: 11 .  metFORMIN (GLUCOPHAGE) 1000 MG tablet, TAKE 1 TABLET BY MOUTH TWICE A DAY WITH A MEAL, Disp: 120 tablet, Rfl: 2 .  methotrexate (RHEUMATREX) 2.5 MG tablet, TAKE 4 TABLETS (10 MG TOTAL) BY MOUTH ONCE A WEEK. CAUTION:CHEMOTHERAPY. PROTECT FROM LIGHT., Disp: 16 tablet, Rfl: 5 .  Multiple Vitamin (MULTIVITAMIN WITH MINERALS) TABS tablet, Take 1 tablet by mouth every evening. , Disp: , Rfl:  .  ondansetron (ZOFRAN) 4 MG tablet, Take 1 tablet (4 mg total) by mouth every 8 (eight) hours as needed for  nausea or vomiting., Disp: 30 tablet, Rfl: 1 .  pantoprazole (PROTONIX) 20 MG tablet, TAKE 1 TABLET BY MOUTH EVERY DAY, Disp: 30 tablet, Rfl: 5  No Known Allergies Review of Systems  Musculoskeletal: Positive for back pain and gait problem.  All other systems reviewed and are negative.  Objective:  There were no vitals filed for this visit.  General: Well developed, nourished, in no acute distress, alert and oriented x3   Dermatological: Skin is warm, dry and supple bilateral. Nails x 10 are well maintained; remaining integument appears unremarkable at this time. There are no open sores, no preulcerative lesions, no rash or signs of infection present.  Vascular: Dorsalis Pedis artery and Posterior Tibial artery pedal pulses are 2/4 bilateral with immedate capillary fill time. Pedal hair growth present. No varicosities and no lower extremity edema present bilateral.   Neruologic: Grossly intact via light touch bilateral. Vibratory intact via tuning fork bilateral. Protective threshold with Semmes Wienstein monofilament intact to all pedal sites bilateral. Patellar and Achilles deep tendon reflexes 2+ bilateral. No Babinski or clonus noted bilateral.   Musculoskeletal: No gross boney pedal deformities bilateral. No pain, crepitus, or limitation noted with foot and ankle range of motion bilateral. Muscular strength 5/5 in all groups tested bilateral.  She has pain on inversion against resistance.  The majority of her tenderness is in a nodular mass in the posterior tibial tendon canal just inferior to the medial malleolus of the left foot.  This is exquisitely tender and she has very little inversion capability against resistance.  Mostly using her tibialis anterior tendon  Gait: Unassisted, Nonantalgic.    Radiographs:  Radiographs taken today demonstrate no major osseous abnormalities other than pes planus no acute findings.  There appears to be soft tissue swelling of the medial ankle this  could possibly be associated with fluid or tendon.  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  Plan: At this point placing her back in her boot with compression and I also requesting an MRI to evaluate the extent of the tear of the tibial tendon.  This is going to be a surgical reconstructive and I need to parameters.     Max T. [place], [place]

## 2018-01-29 ENCOUNTER — Ambulatory Visit: Payer: Medicaid Other | Admitting: Internal Medicine

## 2018-01-29 ENCOUNTER — Other Ambulatory Visit: Payer: Self-pay

## 2018-01-29 ENCOUNTER — Other Ambulatory Visit: Payer: Self-pay | Admitting: Neurology

## 2018-01-29 ENCOUNTER — Encounter: Payer: Self-pay | Admitting: Internal Medicine

## 2018-01-29 VITALS — BP 139/78 | HR 94 | Temp 97.7°F | Ht 68.0 in | Wt 226.4 lb

## 2018-01-29 DIAGNOSIS — G8929 Other chronic pain: Secondary | ICD-10-CM

## 2018-01-29 DIAGNOSIS — E1136 Type 2 diabetes mellitus with diabetic cataract: Secondary | ICD-10-CM

## 2018-01-29 DIAGNOSIS — R21 Rash and other nonspecific skin eruption: Secondary | ICD-10-CM | POA: Diagnosis not present

## 2018-01-29 DIAGNOSIS — R5381 Other malaise: Secondary | ICD-10-CM | POA: Diagnosis not present

## 2018-01-29 DIAGNOSIS — Z79890 Hormone replacement therapy (postmenopausal): Secondary | ICD-10-CM | POA: Diagnosis not present

## 2018-01-29 DIAGNOSIS — M25572 Pain in left ankle and joints of left foot: Secondary | ICD-10-CM

## 2018-01-29 DIAGNOSIS — E890 Postprocedural hypothyroidism: Secondary | ICD-10-CM | POA: Diagnosis not present

## 2018-01-29 DIAGNOSIS — G4700 Insomnia, unspecified: Secondary | ICD-10-CM | POA: Diagnosis not present

## 2018-01-29 DIAGNOSIS — M545 Low back pain: Secondary | ICD-10-CM | POA: Diagnosis not present

## 2018-01-29 DIAGNOSIS — R5383 Other fatigue: Secondary | ICD-10-CM | POA: Diagnosis not present

## 2018-01-29 DIAGNOSIS — R51 Headache: Secondary | ICD-10-CM | POA: Diagnosis not present

## 2018-01-29 DIAGNOSIS — Z7984 Long term (current) use of oral hypoglycemic drugs: Secondary | ICD-10-CM

## 2018-01-29 DIAGNOSIS — E119 Type 2 diabetes mellitus without complications: Secondary | ICD-10-CM

## 2018-01-29 LAB — GLUCOSE, CAPILLARY: Glucose-Capillary: 185 mg/dL — ABNORMAL HIGH (ref 65–99)

## 2018-01-29 LAB — POCT GLYCOSYLATED HEMOGLOBIN (HGB A1C): HEMOGLOBIN A1C: 8.5

## 2018-01-29 MED ORDER — ZOLPIDEM TARTRATE 5 MG PO TABS: 5.0000 mg | ORAL_TABLET | Freq: Every evening | ORAL | 0 refills | Status: DC | PRN

## 2018-01-29 MED ORDER — LEVOTHYROXINE SODIUM 150 MCG PO TABS: 150.0000 ug | ORAL_TABLET | Freq: Every day | ORAL | 0 refills | Status: DC

## 2018-01-29 NOTE — Progress Notes (Signed)
CC: Chronic back and left leg pain, fatigue  HPI:  [name] is a 52 y.o. female with PMHx detailed below presenting with complaints of insomnia, fatigue, weight gain, and also ongoing back and left leg pains. She is also here for routine follow up of her diabetes.  She has been suffering pain continuously for several years and has not had impressive relief after surgery for lumbar radiculopathy almost 2 years ago. She continues to have low back pain with radiation but also has left ankle pain and swelling which was diagnosed as a posterior tibial tendon tear and surgical repair recommended by her orthopedist.  She attributes very disordered sleeping patterns to this pain, as she cannot sleep through the night due to awakening with pain from slight movements in bed. During this time she is also noticing excessive sleepiness during the day and general fatigue, that improves partly with naps. She does not exercise at present due to pain and ankle swelling. She also reports worsening of her chronic headaches describing pounding severe pain that is debilitating for about 2 days at a time when the occur.  She also notes a new small rash on her right upper chest and wonders If this is similar to the intermittent erythema on her anterior shins. She has not felt ill or febrile and denies associated pain or pruritis.  She also reports recent follow up with her ophthalmologist who noted bilateral cataracts and she does note increased difficulty with low light reading but is not impaired significantly.  See problem based assessment and plan below for additional details.  Diabetes mellitus type 2, controlled A: Her glycemic control has worsened today with Hgb A1c of 8.5% up from 6.6% last year. Her goal is <7.0%. This is associated with small weight gain, greatly decreased exercise, and possibly with disorder sleep and eating habits. She was previously well controlled on metformin alone. After a  discussion with her today, she wants to try and proactively control this with getting more active again and working on eating regularly. She does anticipate getting surgical repair of her left ankle which may improve her exercise tolerance again as well.  P: Continue metformin 1000mg  BID Follow up in 2-3 months and reassess progress on weight and exercise  Insomnia A: She describes extremely poor sleep quality today from multiple factors. She has been unable to pursue good sleep hygiene because of frequent night time awakenings due to pain resulting in daytime somnolence. She does not report shortness of breath or gasping with awakenings to suggest OSA as a primary cause. I believe this is negatively impacting her other health problems with worsening of chronic headache plus weight gain and irregular meal timing. She has not seen any benefit with melatonin as a sleep aid. She previously took Ambien intermittently in the past generally using 2.5mg  nightly as needed and felt this was beneficial. She most recently discontinued without complication in early 1610. We can try this as a temporary measure until she can pursue surgery/better control of her underlying pain issues.  P: Ambien 5mg  qHS PRN Follow up in 2 months and reassess treatment benefit  Hypothyroidism A: Her TSH in November was normal at 4.030 but based on total thyroidectomy she would benefit from higher dose treatment. Her fatigue and weight gain could also reflect mild hypothyroidism.  P: We will increase synthroid to 168mcg and repeat TSH in 8 weeks.  Rash Her small upper right chest lesion looks most consistent with ecchymoses, although it has  been present over a week without known trauma. There is no suggestion of swelling to suggest focal panniculitis as would fit with EN and the location is unusual. It does not appear to be a pigmented lesion either.  Recommend observation alone at this time. If it become swollen, larger,  or indurated we need to reassess.   Past Medical History:  Diagnosis Date  . Anemia   . Arthritis    lumbar region- radiculopathy  . Cancer [facility]) 2013    Thyroid Ca-thyroidectomy  . Chronic fatigue   . DM (diabetes mellitus) ([place])   . Gait disturbance   . GERD (gastroesophageal reflux disease)   . Headache(784.0)   . HTN (hypertension)   . Hypothyroidism   . Nephrolithiasis   . Obesity   . OSA (obstructive sleep apnea)    last test- many yrs. ago, done at [facility], no CPAP in use  . Osteoporosis   . Peripheral neuropathy   . Prolonged QT interval   . 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 Negative for fever.  HENT: Negative for congestion.   Eyes: Negative for blurred vision.  Respiratory: Negative for shortness of breath.   Cardiovascular: Negative for chest pain.  Gastrointestinal: Negative for abdominal pain.  Genitourinary: Negative for dysuria.  Musculoskeletal: Positive for back pain and joint pain. Negative for falls.  Skin: Positive for rash.  Neurological: Positive for headaches.  Endo/Heme/Allergies: Does not bruise/bleed easily.  Psychiatric/Behavioral: The patient has insomnia.      Physical Exam: Vitals:   01/29/18 1501  BP: 139/78  Pulse: 94  Temp: 97.7 F (36.5 C)  TempSrc: Oral  SpO2: 100%  Weight: 226 lb 6.4 oz (102.7 kg)  Height: 5\' 8"  (1.727 m)   GENERAL- alert, co-operative, NAD HEENT- Oral mucosa appears moist, good and intact dentition, no cervical LN enlargement, wearing wig/hair extensions CARDIAC- RRR, no murmurs, rubs or gallops. RESP- CTAB, no wheezes or crackles. ABDOMEN- Soft, nontender, no guarding or rebound BACK- Moderate tenderness to direct palpation over left paraspinal muscles, left straight leg raise produces radiating posterior leg pain NEURO- Strength upper and lower extremities- 5/5, Sensation intact  globally EXTREMITIES- pulse 2+, symmetric, left ankle has localized edema over medial aspect, without erythema, pain worse on plantarflexion against resistance SKIN- 2-3cm irregular patch of erythema over right upper chest, without any induration or subcutaneous swelling PSYCH- Normal mood and affect, appropriate thought content and speech.   Assessment & Plan:   See encounters tab for problem based medical decision making.   Patient discussed with Dr. Lynnae January

## 2018-01-29 NOTE — Patient Instructions (Signed)
I am glad to see you today [name] [name]. Sorry to hear you are having so much trouble getting rest and with your ongoing pains.  I have prescribed a short course of ambien to try taking as needed at night to see if this can help you get back to a more normal sleep schedule.  I have also sent a new prescription for your thyroid replacement (150 mcg) that you should start taking. We will need to retest your TSH about 6-8 weeks after you switch to this new dose.  We will wait and see for now about your diabetes. Hgb A1c of 8.5% is not severely uncontrolled but is above our long term goal. If this doesn't improve over the next few months we may need to discuss starting additional medication.

## 2018-02-01 NOTE — Assessment & Plan Note (Signed)
A: Her glycemic control has worsened today with Hgb A1c of 8.5% up from 6.6% last year. Her goal is <7.0%. This is associated with small weight gain, greatly decreased exercise, and possibly with disorder sleep and eating habits. She was previously well controlled on metformin alone. After a discussion with her today, she wants to try and proactively control this with getting more active again and working on eating regularly. She does anticipate getting surgical repair of her left ankle which may improve her exercise tolerance again as well.  P: Continue metformin 1000mg  BID Follow up in 2-3 months and reassess progress on weight and exercise

## 2018-02-01 NOTE — Assessment & Plan Note (Addendum)
A: Her TSH in November was normal at 4.030 but based on total thyroidectomy she would benefit from higher dose treatment. Her fatigue and weight gain could also reflect mild hypothyroidism.  P: We will increase synthroid to 172mcg and repeat TSH in 8 weeks.

## 2018-02-01 NOTE — Assessment & Plan Note (Signed)
Her small upper right chest lesion looks most consistent with ecchymoses, although it has been present over a week without known trauma. There is no suggestion of swelling to suggest focal panniculitis as would fit with EN and the location is unusual. It does not appear to be a pigmented lesion either.  Recommend observation alone at this time. If it become swollen, larger, or indurated we need to reassess.

## 2018-02-01 NOTE — Assessment & Plan Note (Signed)
A: She describes extremely poor sleep quality today from multiple factors. She has been unable to pursue good sleep hygiene because of frequent night time awakenings due to pain resulting in daytime somnolence. She does not report shortness of breath or gasping with awakenings to suggest OSA as a primary cause. I believe this is negatively impacting her other health problems with worsening of chronic headache plus weight gain and irregular meal timing. She has not seen any benefit with melatonin as a sleep aid. She previously took Ambien intermittently in the past generally using 2.5mg  nightly as needed and felt this was beneficial. She most recently discontinued without complication in early 1314. We can try this as a temporary measure until she can pursue surgery/better control of her underlying pain issues.  P: Ambien 5mg  qHS PRN Follow up in 2 months and reassess treatment benefit

## 2018-02-02 NOTE — Progress Notes (Signed)
Internal Medicine Clinic Attending  Case discussed with Dr. Rice at the time of the visit.  We reviewed the resident's history and exam and pertinent patient test results.  I agree with the assessment, diagnosis, and plan of care documented in the resident's note.  

## 2018-02-12 ENCOUNTER — Encounter: Payer: Medicaid Other | Admitting: Internal Medicine

## 2018-03-02 ENCOUNTER — Encounter: Payer: Self-pay | Admitting: Podiatry

## 2018-03-02 ENCOUNTER — Ambulatory Visit: Payer: Medicaid Other | Admitting: Podiatry

## 2018-03-02 DIAGNOSIS — M66872 Spontaneous rupture of other tendons, left ankle and foot: Secondary | ICD-10-CM | POA: Diagnosis not present

## 2018-03-02 DIAGNOSIS — M76822 Posterior tibial tendinitis, left leg: Secondary | ICD-10-CM

## 2018-03-03 ENCOUNTER — Telehealth: Payer: Self-pay | Admitting: *Deleted

## 2018-03-03 DIAGNOSIS — M76822 Posterior tibial tendinitis, left leg: Secondary | ICD-10-CM

## 2018-03-03 DIAGNOSIS — M66872 Spontaneous rupture of other tendons, left ankle and foot: Secondary | ICD-10-CM

## 2018-03-03 NOTE — Progress Notes (Signed)
She presents today states that the posterior tibial tendon on the left is not any better she states that her foot feels worse and it has been so swollen recently she can barely bear weight on it.  She states that it hurts right in here she points to the posterior tibial tendon area of the left ankle there is minimal inversion against resistance and excruciating pain.  Objective: Vital signs are stable she is alert and oriented x3.  Pulses are palpable.  Considerable edema to the left lower extremity around the ankle and the posterior tibial tendon area she has tenderness on palpation of this area she has pain on inversion and with weightbearing her foot is in an everted stance.  More than likely a small tear or rupture of the posterior tibial tendon.  Assessment: Posterior tibial tendinitis or rupture of the posterior tibial tendon suspected.  Plan: Request MRI of the right foot at this point for posterior tibial tendon tear.  Notify the patient is to the results.

## 2018-03-03 NOTE — Telephone Encounter (Signed)
-----   Message from Rip Harbour, [facility] sent at 03/02/2018  3:31 PM EDT ----- Regarding: MRI MRI ankle left - evaluate posterior tibial tendonitis vs tear left - surgical consideration

## 2018-03-04 ENCOUNTER — Telehealth: Payer: Self-pay | Admitting: *Deleted

## 2018-03-05 ENCOUNTER — Other Ambulatory Visit: Payer: Self-pay | Admitting: Family Medicine

## 2018-03-05 DIAGNOSIS — Z139 Encounter for screening, unspecified: Secondary | ICD-10-CM

## 2018-03-11 ENCOUNTER — Ambulatory Visit
Admission: RE | Admit: 2018-03-11 | Discharge: 2018-03-11 | Disposition: A | Discharge status: Home / Self Care | Payer: Medicaid Other | Source: Ambulatory Visit | Attending: Podiatry | Admitting: Podiatry

## 2018-03-15 ENCOUNTER — Telehealth: Payer: Self-pay

## 2018-03-15 NOTE — Telephone Encounter (Signed)
LVM for patient to call and make an appt regarding surgical consults from MRI result.

## 2018-03-15 NOTE — Telephone Encounter (Signed)
-----   Message from Garrel Ridgel, [place] sent at 03/15/2018  6:47 AM EDT ----- Have her in for a surgery consult when I get back from vacation.

## 2018-03-16 ENCOUNTER — Ambulatory Visit: Payer: Medicaid Other | Admitting: Podiatry

## 2018-03-16 ENCOUNTER — Encounter: Payer: Self-pay | Admitting: Podiatry

## 2018-03-16 DIAGNOSIS — M76822 Posterior tibial tendinitis, left leg: Secondary | ICD-10-CM | POA: Diagnosis not present

## 2018-03-16 DIAGNOSIS — E1142 Type 2 diabetes mellitus with diabetic polyneuropathy: Secondary | ICD-10-CM | POA: Diagnosis not present

## 2018-03-16 DIAGNOSIS — M66872 Spontaneous rupture of other tendons, left ankle and foot: Secondary | ICD-10-CM

## 2018-03-16 NOTE — Telephone Encounter (Signed)
Pt called and I informed Dr. Milinda Pointer had reviewed the MRI results and would discuss at the appt.

## 2018-03-17 NOTE — Progress Notes (Signed)
She presents today for discussion regarding her painful left ankle.  At this point she states that she can barely put weight on it and it hurts her every day at some point.  Objective: Vital signs are stable she is alert and oriented x3 pulses are palpable.  Moderate edema to left lower extremity with pain on palpation of the posterior tibial tendon.  MRI does demonstrate a tear of the posterior tibial tendon from the medial malleolus distal to the navicular tuberosity.  This correlates with her pain on palpation.  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  Plan: At this point I placed her in a Cam walker.  After reviewing her labs which a recent A1c March 2019 was an 8.5 I expressed to her that elective surgery cannot be done until we get this down.  I told her that he needed to be released to 7.8 before we would consider surgery.  I provided her with a requisition for blood work to be taken 1 month from today.  I will follow-up with her immediately following that blood work for surgical discussion.  Until then she is to wear the cam walker and I will follow-up with her in the near future

## 2018-03-26 ENCOUNTER — Ambulatory Visit
Admission: RE | Admit: 2018-03-26 | Discharge: 2018-03-26 | Disposition: A | Discharge status: Home / Self Care | Payer: Medicaid Other | Source: Ambulatory Visit | Attending: Family Medicine | Admitting: Family Medicine

## 2018-03-26 DIAGNOSIS — Z139 Encounter for screening, unspecified: Secondary | ICD-10-CM

## 2018-04-02 ENCOUNTER — Ambulatory Visit: Payer: Medicaid Other | Admitting: Internal Medicine

## 2018-04-02 ENCOUNTER — Other Ambulatory Visit: Payer: Self-pay

## 2018-04-02 ENCOUNTER — Encounter: Payer: Self-pay | Admitting: Internal Medicine

## 2018-04-02 VITALS — BP 150/71 | HR 95 | Temp 97.8°F | Ht 68.0 in | Wt 220.2 lb

## 2018-04-02 DIAGNOSIS — S86012D Strain of left Achilles tendon, subsequent encounter: Secondary | ICD-10-CM

## 2018-04-02 DIAGNOSIS — Z8739 Personal history of other diseases of the musculoskeletal system and connective tissue: Secondary | ICD-10-CM | POA: Diagnosis not present

## 2018-04-02 DIAGNOSIS — M5386 Other specified dorsopathies, lumbar region: Secondary | ICD-10-CM

## 2018-04-02 DIAGNOSIS — E119 Type 2 diabetes mellitus without complications: Secondary | ICD-10-CM

## 2018-04-02 DIAGNOSIS — Z7984 Long term (current) use of oral hypoglycemic drugs: Secondary | ICD-10-CM

## 2018-04-02 DIAGNOSIS — Z791 Long term (current) use of non-steroidal anti-inflammatories (NSAID): Secondary | ICD-10-CM | POA: Diagnosis not present

## 2018-04-02 DIAGNOSIS — X58XXXD Exposure to other specified factors, subsequent encounter: Secondary | ICD-10-CM

## 2018-04-02 DIAGNOSIS — G4700 Insomnia, unspecified: Secondary | ICD-10-CM | POA: Diagnosis not present

## 2018-04-02 DIAGNOSIS — M5442 Lumbago with sciatica, left side: Secondary | ICD-10-CM | POA: Diagnosis not present

## 2018-04-02 DIAGNOSIS — M25572 Pain in left ankle and joints of left foot: Secondary | ICD-10-CM

## 2018-04-02 LAB — POCT GLYCOSYLATED HEMOGLOBIN (HGB A1C): Hemoglobin A1C: 7.7

## 2018-04-02 LAB — GLUCOSE, CAPILLARY: GLUCOSE-CAPILLARY: 129 mg/dL — AB (ref 65–99)

## 2018-04-02 NOTE — Progress Notes (Signed)
CC: Left leg and back pain  HPI:  [name] [name] is a 52 y.o. female with PMHx detailed below presenting for her increased left sided low back and leg pain. She was previously found to have a longitudinal left achilles tendon tear and is now wearing a CAM boot with a goal of elective surgical repair. Her surgeon requests improved diabetes control with Hgb A1c <7.8% before proceeding with this surgery and refers her back to [facility] for management. In the meantime her low back and radiating left leg pain is worse than ever and prevents her from sleeping at night. She is taking ibuprofen 4 times daily and applying topical treatments such as bengay without significant benefit.   See problem based assessment and plan below for additional details.  Diabetes mellitus type 2, controlled Glycemic control is improved today at 7.7%.  This is finally at the prespecified goal by her orthopedic surgeon under 7.8% to pursue elective repair of longitudinal tear in the left Achilles tendon.  There were no associated medication changes, so this reflects improvement in glucose management going along with her 6 pound interval weight loss.  P: Continue metformin 1000 mg twice daily We will continue at the current medicines while she continues to maximize diet and lifestyle interventions  Sciatica of left side associated with disorder of lumbar spine She continues to have severe bothersome low back pain with radiation down the left leg.  This is particularly bothering her now due to the heavy immobilization boot for her ankle injury.  Is also waking her up at night.  On interview she has not actually started the previously recommended duloxetine to see if this is beneficial in her radiculopathy pain.  P: Recommend she continue supportive care with exercise and ongoing weight loss Recommended trying the previously prescribed duloxetine She could continue to pursue with her surgeon's office or seek epidural  steroid injections after getting through any further procedures on the left ankle   Past Medical History:  Diagnosis Date  . Anemia   . Arthritis    lumbar region- radiculopathy  . Cancer [facility]) 2013    Thyroid Ca-thyroidectomy  . Chronic fatigue   . DM (diabetes mellitus) ([place])   . Gait disturbance   . GERD (gastroesophageal reflux disease)   . Headache(784.0)   . HTN (hypertension)   . Hypothyroidism   . Nephrolithiasis   . Obesity   . OSA (obstructive sleep apnea)    last test- many yrs. ago, done at [facility], no CPAP in use  . Osteoporosis   . Peripheral neuropathy   . Prolonged QT interval   . 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  Eyes: Negative for blurred vision.  Cardiovascular: Positive for leg swelling. Negative for chest pain.  Gastrointestinal: Negative for constipation and diarrhea.  Musculoskeletal: Positive for back pain and joint pain.  Skin: Negative for rash.  Neurological: Negative for dizziness.  Psychiatric/Behavioral: The patient has insomnia.      Physical Exam: Vitals:   04/02/18 1542  BP: (!) 150/71  Pulse: 95  Temp: 97.8 F (36.6 C)  TempSrc: Oral  SpO2: 99%  Weight: 220 lb 3.2 oz (99.9 kg)  Height: 5\' 8"  (1.727 m)   GENERAL- alert, co-operative, NAD, very well-kept HEENT-wearing a wig, oral mucosa appears moist, no cervical adenopathy CARDIAC- RRR, no murmurs, rubs or gallops. RESP- CTAB, no wheezes or crackles.  ABDOMEN- Soft, nontender BACK-left lumbar paraspinal tenderness to direct palpation NEURO-both lower extremities with intact distal sensation EXTREMITIES-left ankle in a CAM boot   Assessment & Plan:   See encounters tab for problem based medical decision making.   Patient discussed with Dr. Rebeca Alert

## 2018-04-05 NOTE — Assessment & Plan Note (Signed)
She continues to have severe bothersome low back pain with radiation down the left leg.  This is particularly bothering her now due to the heavy immobilization boot for her ankle injury.  Is also waking her up at night.  On interview she has not actually started the previously recommended duloxetine to see if this is beneficial in her radiculopathy pain.  P: Recommend she continue supportive care with exercise and ongoing weight loss Recommended trying the previously prescribed duloxetine She could continue to pursue with her surgeon's office or seek epidural steroid injections after getting through any further procedures on the left ankle

## 2018-04-05 NOTE — Assessment & Plan Note (Signed)
Glycemic control is improved today at 7.7%.  This is finally at the prespecified goal by her orthopedic surgeon under 7.8% to pursue elective repair of longitudinal tear in the left Achilles tendon.  There were no associated medication changes, so this reflects improvement in glucose management going along with her 6 pound interval weight loss.  P: Continue metformin 1000 mg twice daily We will continue at the current medicines while she continues to maximize diet and lifestyle interventions

## 2018-04-06 NOTE — Progress Notes (Signed)
Internal Medicine Clinic Attending  Case discussed with Dr. Rice  at the time of the visit.  We reviewed the resident's history and exam and pertinent patient test results.  I agree with the assessment, diagnosis, and plan of care documented in the resident's note.  Alexander N Raines, MD   

## 2018-04-07 ENCOUNTER — Other Ambulatory Visit: Payer: Self-pay | Admitting: Internal Medicine

## 2018-04-09 ENCOUNTER — Other Ambulatory Visit: Payer: Self-pay | Admitting: Internal Medicine

## 2018-04-12 NOTE — Telephone Encounter (Signed)
Entered in error

## 2018-04-20 ENCOUNTER — Ambulatory Visit: Payer: Medicaid Other | Admitting: Podiatry

## 2018-04-20 ENCOUNTER — Encounter: Payer: Self-pay | Admitting: Podiatry

## 2018-04-20 DIAGNOSIS — M76822 Posterior tibial tendinitis, left leg: Secondary | ICD-10-CM

## 2018-04-20 DIAGNOSIS — M66872 Spontaneous rupture of other tendons, left ankle and foot: Secondary | ICD-10-CM

## 2018-04-20 NOTE — Patient Instructions (Signed)
Pre-Operative Instructions  Congratulations, you have decided to take an important step towards improving your quality of life.  You can be assured that the doctors and staff at [facility] will be with you every step of the way.  Here are some important things you should know:  1. Plan to be at the surgery center/hospital at least 1 (one) hour prior to your scheduled time, unless otherwise directed by the surgical center/hospital staff.  You must have a responsible adult accompany you, remain during the surgery and drive you home.  Make sure you have directions to the surgical center/hospital to ensure you arrive on time. 2. If you are having surgery at [facility] or [facility] hospitals, you will need a copy of your medical history and physical form from your family physician within one month prior to the date of surgery. We will give you a form for your primary physician to complete.  3. We make every effort to accommodate the date you request for surgery.  However, there are times where surgery dates or times have to be moved.  We will contact you as soon as possible if a change in schedule is required.   4. No aspirin/ibuprofen for one week before surgery.  If you are on aspirin, any non-steroidal anti-inflammatory medications (Mobic, Aleve, Ibuprofen) should not be taken seven (7) days prior to your surgery.  You make take Tylenol for pain prior to surgery.  5. Medications - If you are taking daily heart and blood pressure medications, seizure, reflux, allergy, asthma, anxiety, pain or diabetes medications, make sure you notify the surgery center/hospital before the day of surgery so they can tell you which medications you should take or avoid the day of surgery. 6. No food or drink after midnight the night before surgery unless directed otherwise by surgical center/hospital staff. 7. No alcoholic beverages 24-hours prior to surgery.  No smoking 24-hours prior or 24-hours after  surgery. 8. Wear loose pants or shorts. They should be loose enough to fit over bandages, boots, and casts. 9. Don't wear slip-on shoes. Sneakers are preferred. 10. Bring your boot with you to the surgery center/hospital.  Also bring crutches or a walker if your physician has prescribed it for you.  If you do not have this equipment, it will be provided for you after surgery. 11. If you have not been contacted by the surgery center/hospital by the day before your surgery, call to confirm the date and time of your surgery. 12. Leave-time from work may vary depending on the type of surgery you have.  Appropriate arrangements should be made prior to surgery with your employer. 13. Prescriptions will be provided immediately following surgery by your doctor.  Fill these as soon as possible after surgery and take the medication as directed. Pain medications will not be refilled on weekends and must be approved by the doctor. 14. Remove nail polish on the operative foot and avoid getting pedicures prior to surgery. 15. Wash the night before surgery.  The night before surgery wash the foot and leg well with water and the antibacterial soap provided. Be sure to pay special attention to beneath the toenails and in between the toes.  Wash for at least three (3) minutes. Rinse thoroughly with water and dry well with a towel.  Perform this wash unless told not to do so by your physician.  Enclosed: 1 Ice pack (please put in freezer the night before surgery)   1 Hibiclens skin cleaner     Pre-op instructions  If you have any questions regarding the instructions, please do not hesitate to call our office.  [place]: [address], [place], [place] [address] -- [phone]  [employer] [facility] [employer]: [address] [place], [place] [address] -- [phone]  [place]: [address]  [place], [place] [address] -- [phone]  [place]: [address], [address], [place], [place] [address] -- [phone]  Website:  [email or web address] 

## 2018-04-21 NOTE — Progress Notes (Signed)
She presents today for follow-up of her posterior tibial tendinitis states that is really is not getting any better the boot seems to help stabilize it but it is absolutely painful without the boot.  Is affecting my ability to maintain my weight and activity level and I am ready for surgery.  She states that she is been very good with her blood sugar she has had a recent blood sugar test which demonstrated a 7.8.  Objective: Vital signs are stable she is alert and oriented x3.  Pulses are palpable.  Still has severe pain with severe flatfoot deformity left foot and ankle.  Pain is associated with posterior tibial tendon MRI previously taken does demonstrate posterior tibial tendon tear chronic.  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  Plan: Recommended highly surgical intervention repairing the posterior tibial tendon.  We will go ahead and set her up for this.  Went over a consent form today line by line number by number given her ample time to ask questions she self it regarding a posterior tibial tendon repair of her left foot and ankle with a cast application.  She understands this and is amenable to it.  Did discuss the possible postop complications which may include but are not limited to postop pain bleeding swelling infection recurrence need for further surgery overcorrection under correction inability to heal the wound loss of digit loss of limb loss of life because of the diabetes..  We did discuss preop instructions she is provided with information regarding these as well as information regarding the surgery center and anesthesia.  Follow-up with her in the near future for surgical intervention.

## 2018-04-24 ENCOUNTER — Emergency Department (HOSPITAL_COMMUNITY): Payer: Medicaid Other

## 2018-04-24 ENCOUNTER — Emergency Department (HOSPITAL_BASED_OUTPATIENT_CLINIC_OR_DEPARTMENT_OTHER)
Admit: 2018-04-24 | Discharge: 2018-04-24 | Disposition: A | Discharge status: Home / Self Care | Payer: Medicaid Other | Attending: Physician Assistant | Admitting: Physician Assistant

## 2018-04-24 ENCOUNTER — Emergency Department (HOSPITAL_COMMUNITY)
Admission: EM | Admit: 2018-04-24 | Discharge: 2018-04-24 | Disposition: A | Discharge status: Home / Self Care | Payer: Medicaid Other | Attending: Physician Assistant | Admitting: Physician Assistant

## 2018-04-24 ENCOUNTER — Encounter (HOSPITAL_COMMUNITY): Payer: Self-pay

## 2018-04-24 ENCOUNTER — Other Ambulatory Visit: Payer: Self-pay

## 2018-04-24 DIAGNOSIS — I10 Essential (primary) hypertension: Secondary | ICD-10-CM | POA: Diagnosis not present

## 2018-04-24 DIAGNOSIS — E119 Type 2 diabetes mellitus without complications: Secondary | ICD-10-CM | POA: Diagnosis not present

## 2018-04-24 DIAGNOSIS — Z79899 Other long term (current) drug therapy: Secondary | ICD-10-CM | POA: Diagnosis not present

## 2018-04-24 DIAGNOSIS — E039 Hypothyroidism, unspecified: Secondary | ICD-10-CM | POA: Insufficient documentation

## 2018-04-24 DIAGNOSIS — Z85850 Personal history of malignant neoplasm of thyroid: Secondary | ICD-10-CM | POA: Insufficient documentation

## 2018-04-24 DIAGNOSIS — R5381 Other malaise: Secondary | ICD-10-CM | POA: Diagnosis not present

## 2018-04-24 DIAGNOSIS — Z7901 Long term (current) use of anticoagulants: Secondary | ICD-10-CM | POA: Diagnosis not present

## 2018-04-24 DIAGNOSIS — R1012 Left upper quadrant pain: Secondary | ICD-10-CM | POA: Diagnosis present

## 2018-04-24 DIAGNOSIS — Z7982 Long term (current) use of aspirin: Secondary | ICD-10-CM | POA: Insufficient documentation

## 2018-04-24 DIAGNOSIS — R5383 Other fatigue: Secondary | ICD-10-CM

## 2018-04-24 DIAGNOSIS — M79609 Pain in unspecified limb: Secondary | ICD-10-CM

## 2018-04-24 DIAGNOSIS — M79605 Pain in left leg: Secondary | ICD-10-CM | POA: Diagnosis not present

## 2018-04-24 LAB — CBC
HCT: 37.6 % (ref 36.0–46.0)
HEMOGLOBIN: 12.1 g/dL (ref 12.0–15.0)
MCH: 27.9 pg (ref 26.0–34.0)
MCHC: 32.2 g/dL (ref 30.0–36.0)
MCV: 86.6 fL (ref 78.0–100.0)
Platelets: 332 10*3/uL (ref 150–400)
RBC: 4.34 MIL/uL (ref 3.87–5.11)
RDW: 14.4 % (ref 11.5–15.5)
WBC: 4.9 10*3/uL (ref 4.0–10.5)

## 2018-04-24 LAB — URINALYSIS, ROUTINE W REFLEX MICROSCOPIC
BILIRUBIN URINE: NEGATIVE
Glucose, UA: 50 mg/dL — AB
Hgb urine dipstick: NEGATIVE
Ketones, ur: NEGATIVE mg/dL
LEUKOCYTES UA: NEGATIVE
NITRITE: NEGATIVE
Protein, ur: NEGATIVE mg/dL
SPECIFIC GRAVITY, URINE: 1.006 (ref 1.005–1.030)
pH: 7 (ref 5.0–8.0)

## 2018-04-24 LAB — COMPREHENSIVE METABOLIC PANEL
ALBUMIN: 3.8 g/dL (ref 3.5–5.0)
ALK PHOS: 82 U/L (ref 38–126)
ALT: 18 U/L (ref 14–54)
ANION GAP: 10 (ref 5–15)
AST: 20 U/L (ref 15–41)
BILIRUBIN TOTAL: 0.3 mg/dL (ref 0.3–1.2)
BUN: 12 mg/dL (ref 6–20)
CO2: 25 mmol/L (ref 22–32)
Calcium: 8.9 mg/dL (ref 8.9–10.3)
Chloride: 105 mmol/L (ref 101–111)
Creatinine, Ser: 0.65 mg/dL (ref 0.44–1.00)
GFR calc Af Amer: >60 mL/min (ref >60)
GLUCOSE: 241 mg/dL — AB (ref 65–99)
Potassium: 4 mmol/L (ref 3.5–5.1)
SODIUM: 140 mmol/L (ref 135–145)
TOTAL PROTEIN: 7.5 g/dL (ref 6.5–8.1)

## 2018-04-24 LAB — LIPASE, BLOOD: LIPASE: 47 U/L (ref 11–51)

## 2018-04-24 LAB — I-STAT BETA HCG BLOOD, ED (MC, WL, AP ONLY): I-stat hCG, quantitative: <5 m[IU]/mL (ref <5)

## 2018-04-24 MED ORDER — SUCRALFATE 1 G PO TABS: 1.0000 g | ORAL_TABLET | Freq: Three times a day (TID) | ORAL | 0 refills | Status: DC

## 2018-04-24 MED ORDER — ONDANSETRON HCL 4 MG/2ML IJ SOLN
4.0000 mg | Freq: Once | INTRAMUSCULAR | Status: AC
  Administered 2018-04-24: 4 mg via INTRAVENOUS
  Filled 2018-04-24: qty 2

## 2018-04-24 MED ORDER — ONDANSETRON 4 MG PO TBDP: 4.0000 mg | ORAL_TABLET | Freq: Three times a day (TID) | ORAL | 0 refills | Status: DC | PRN

## 2018-04-24 MED ORDER — HYDROCODONE-ACETAMINOPHEN 5-325 MG PO TABS: 1.0000 | ORAL_TABLET | Freq: Four times a day (QID) | ORAL | 0 refills | Status: DC | PRN

## 2018-04-24 MED ORDER — SODIUM CHLORIDE 0.9 % IV BOLUS
1000.0000 mL | Freq: Once | INTRAVENOUS | Status: AC
  Administered 2018-04-24: 1000 mL via INTRAVENOUS

## 2018-04-24 NOTE — ED Notes (Signed)
Patient transported to X-ray 

## 2018-04-24 NOTE — ED Provider Notes (Signed)
[facility] [facility] DEPT Provider Note   CSN: [id number] Arrival date & time: 04/24/18  1256     History   Chief Complaint Chief Complaint  Patient presents with  . Weakness  . Abdominal Pain    LUQ    HPI [name] [name] is a 52 y.o. female.   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  Patient denies fevers, vomiting.  Some of her stools have been more loose and she has had a lot of nausea.  Symptoms are not made worse with food.  No urinary symptoms or skin rashes.  No cough or URI symptoms.  Patient has not taken her medications today.  She has had some generalized left leg pain.  She is currently wearing a cam walker due to ligament injury.  She states that she is due to have surgery next month. The onset of this condition was acute. The course is constant. Aggravating factors: none. Alleviating factors: none.       Past Medical History:  Diagnosis Date  . Anemia   . Arthritis    lumbar region- radiculopathy  . Cancer [facility]) 2013    Thyroid Ca-thyroidectomy  . Chronic fatigue   . DM (diabetes mellitus) ([place])   . Gait disturbance   . GERD (gastroesophageal reflux disease)   . Headache(784.0)   . HTN (hypertension)   . Hypothyroidism   . Nephrolithiasis   . Obesity   . OSA (obstructive sleep apnea)    last test- many yrs. ago, done at [facility], no CPAP in use  . Osteoporosis   . Peripheral neuropathy   . Prolonged QT interval   . 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 Rash 02/01/2018  . Ankle pain 12/16/2017  . Abnormal cervical Papanicolaou smear 04/17/2017  . Throat pain 04/17/2017  . Obesity 11/24/2016  . Herniated lumbar disc without myelopathy 08/20/2016   . Healthcare maintenance 02/18/2016  . Erythema nodosum 02/01/2016  . Insomnia 01/02/2015  . Intractable migraine without aura 02/09/2013  . Intermittent left-sided chest pain 01/26/2013  . Hypothyroidism 05/08/2011  . FIBROIDS, UTERUS 11/06/2010  . Abdominal pain 06/25/2010  . HYPERLIPIDEMIA 01/28/2010  . Sciatica of left side associated with disorder of lumbar spine 10/20/2007  . Neurosarcoidosis ([place]) 05/25/2007  . Diabetes mellitus type 2, controlled ([place]) 05/25/2007  . OBSTRUCTIVE SLEEP APNEA 05/25/2007  . Essential hypertension 05/25/2007  . Gastroesophageal reflux disease 05/25/2007  . History of osteoporosis 05/25/2007  . LONG QT SYNDROME 05/25/2007    Past Surgical History:  Procedure Laterality Date  . BRAIN SURGERY  2001   for sarcoidosis  . BREAST BIOPSY Left   . BREAST BIOPSY Right   . BREAST EXCISIONAL BIOPSY Right    x2 benign  . BREAST EXCISIONAL BIOPSY Left    benign  . BREAST SURGERY Bilateral    biopsies   . DILATION AND CURETTAGE OF UTERUS    . HYSTEROSCOPY    . LUMBAR LAMINECTOMY/DECOMPRESSION MICRODISCECTOMY Left 08/20/2016   Procedure: Left Lumbar four-five Microdiskectomy;  Surgeon: Leeroy Cha, MD;  Location: [place] [facility]UR[facility] ORS;  Service: Neurosurgery;  Laterality: Left;  . PARTIAL HYSTERECTOMY  2013  . ROTATOR CUFF REPAIR Right    07/2013  . Suboccipital craniotomy    .  THYROIDECTOMY     Precancerous lesion     OB History    Gravida  5   Para  4   Term      Preterm      AB      Living  4     SAB      TAB      Ectopic      Multiple      Live Births               Home Medications    Prior to Admission medications   Medication Sig Start Date End Date Taking? Authorizing Provider  aspirin 81 MG chewable tablet Chew 81 mg by mouth daily.   Yes [provider]  atorvastatin (LIPITOR) 20 MG tablet TAKE 1 TABLET BY MOUTH EVERY DAY IN THE EVENING 04/12/18  Yes Rice, Resa Miner, MD  folic acid (FOLVITE) 1 MG tablet  TAKE 1 TABLET BY MOUTH EVERY DAY 04/07/18  Yes Rice, Resa Miner, MD  ibuprofen (ADVIL,MOTRIN) 200 MG tablet Take 400 mg every 6 (six) hours as needed by mouth for headache.   Yes [provider]  levothyroxine (SYNTHROID, LEVOTHROID) 150 MCG tablet Take 1 tablet (150 mcg total) by mouth daily before breakfast. 01/29/18  Yes Rice, Resa Miner, MD  losartan (COZAAR) 50 MG tablet Take 1 tablet (50 mg total) by mouth daily. 08/20/17  Yes Rice, Resa Miner, MD  metFORMIN (GLUCOPHAGE) 1000 MG tablet TAKE 1 TABLET BY MOUTH TWICE A DAY WITH A MEAL 11/09/17  Yes Rice, Resa Miner, MD  methotrexate (RHEUMATREX) 2.5 MG tablet TAKE 4 TABLETS (10 MG TOTAL) BY MOUTH ONCE A WEEK. CAUTION:CHEMOTHERAPY. PROTECT FROM LIGHT. 02/01/18  Yes Kathrynn Ducking, MD  Multiple Vitamin (MULTIVITAMIN WITH MINERALS) TABS tablet Take 1 tablet by mouth every evening.    Yes [provider]  pantoprazole (PROTONIX) 20 MG tablet TAKE 1 TABLET BY MOUTH EVERY DAY 01/01/18  Yes Rice, Resa Miner, MD  [facility] SENNA PLUS 8.6-50 MG tablet TAKE 2 TABLETS BY MOUTH TWICE A DAY Patient not taking: Reported on 04/24/2018 10/29/17   Collier Salina, MD  DULoxetine (CYMBALTA) 30 MG capsule 1 capsule daily for 2 weeks then take 1 twice daily Patient not taking: Reported on 04/24/2018 01/05/18   Kathrynn Ducking, MD  HYDROcodone-acetaminophen (NORCO/VICODIN) 5-325 MG tablet Take 1 tablet by mouth every 6 (six) hours as needed for moderate pain. Patient not taking: Reported on 04/24/2018 12/31/17   Kathrynn Ducking, MD  ondansetron (ZOFRAN) 4 MG tablet Take 1 tablet (4 mg total) by mouth every 8 (eight) hours as needed for nausea or vomiting. Patient not taking: Reported on 04/24/2018 08/27/17   Kathrynn Ducking, MD    Family History Family History  Problem Relation Age of Onset  . Heart disease Mother        questionable CAD and arrythmias   . Cancer Mother        Breast cancer  . Venous thrombosis Sister   . Diabetes  Sister   . Heart disease Sister   . Diabetes Brother   . Heart disease Brother   . Cancer Maternal Aunt   . Venous thrombosis Sister   . Diabetes Sister   . Heart disease Sister     Social History Social History   Tobacco Use  . Smoking status: Never Smoker  . Smokeless tobacco: Never Used  Substance Use Topics  . Alcohol use: No    Alcohol/week: 0.0  oz  . Drug use: No     Allergies   Patient has no known allergies.   Review of Systems Review of Systems  Constitutional: Negative for fever.  HENT: Negative for rhinorrhea and sore throat.   Eyes: Negative for redness.  Respiratory: Negative for cough and shortness of breath.   Cardiovascular: Negative for chest pain and leg swelling.  Gastrointestinal: Positive for abdominal pain and nausea. Negative for blood in stool, constipation, diarrhea and vomiting.  Genitourinary: Negative for dysuria.  Musculoskeletal: Positive for myalgias (L leg).  Skin: Negative for rash.  Neurological: Negative for headaches.     Physical Exam Updated Vital Signs BP (!) 154/87 (BP Location: Right Arm)   Pulse 85   Temp 98.1 F (36.7 C) (Oral)   Resp 19   LMP 12/03/2011   SpO2 100%   Physical Exam  Constitutional: She appears well-developed and well-nourished.  HENT:  Head: Normocephalic and atraumatic.  Mouth/Throat: Oropharynx is clear and moist.  Eyes: Conjunctivae are normal. Right eye exhibits no discharge. Left eye exhibits no discharge.  Neck: Normal range of motion. Neck supple.  Cardiovascular: Normal rate, regular rhythm and normal heart sounds.  No murmur heard. Pulmonary/Chest: Effort normal and breath sounds normal. No respiratory distress. She has no wheezes. She has no rales.  Abdominal: Soft. Bowel sounds are normal. There is no hepatosplenomegaly. There is tenderness (mild) in the left upper quadrant. There is no rebound, no guarding, no tenderness at McBurney's point and negative Murphy's sign.    Musculoskeletal: She exhibits tenderness.  L foot in CAM walker. No focal pain.   Neurological: She is alert.  Skin: Skin is warm and dry.  Psychiatric: She has a normal mood and affect.  Nursing note and vitals reviewed.    ED Treatments / Results  Labs (all labs ordered are listed, but only abnormal results are displayed) Labs Reviewed  COMPREHENSIVE METABOLIC PANEL - Abnormal; Notable for the following components:      Result Value   Glucose, Bld 241 (*)    All other components within normal limits  URINALYSIS, ROUTINE W REFLEX MICROSCOPIC - Abnormal; Notable for the following components:   Color, Urine STRAW (*)    Glucose, UA 50 (*)    All other components within normal limits  LIPASE, BLOOD  CBC  I-STAT BETA HCG BLOOD, ED ([facility], [facility], [facility] ONLY)    EKG None  Radiology Dg Abd Acute W/chest  Result Date: 04/24/2018 CLINICAL DATA:  Left upper quadrant abdominal pain for the past 3 days. Currently taking chemotherapy for brain cancer. Weakness and near syncope twice in the past week. EXAM: DG ABDOMEN ACUTE W/ 1V CHEST COMPARISON:  Chest dated 10/17/2017 and abdomen and pelvis CT dated 05/11/2017. FINDINGS: Normal sized heart. Clear lungs. Stable mild dextroconvex thoracolumbar scoliosis. Normal bowel gas pattern without free peritoneal air. Prominent stool in the right and transverse colon. IMPRESSION: No acute abnormality.  Prominent stool. Electronically Signed   By: Claudie Revering M.D.   On: 04/24/2018 15:48    Procedures Procedures (including critical care time)  Medications Ordered in ED Medications  sodium chloride 0.9 % bolus 1,000 mL (1,000 mLs Intravenous New Bag/Given 04/24/18 1646)  ondansetron (ZOFRAN) injection 4 mg (4 mg Intravenous Given 04/24/18 1646)     Initial Impression / Assessment and Plan / ED Course  I have reviewed the triage vital signs and the nursing notes.  Pertinent labs & imaging results that were available during my care of the patient were  reviewed by me and considered in my medical decision making (see chart for details).     Patient seen and examined. Medications ordered. Initial labs reviewed with patient. We will treat symptoms with IV fluids and nausea.  Will check abdominal and chest x-ray given left upper quadrant pain.  Will check lower extremity ultrasound given leg pain in setting of recent poor mobility. I do not feel she requires CT imaging at this point.   Vital signs reviewed and are as follows: BP (!) 154/87 (BP Location: Right Arm)   Pulse 85   Temp 98.1 F (36.7 C) (Oral)   Resp 19   LMP 12/03/2011   SpO2 100%   4:49 PM [name] neg for DVT, does show ankle fluid consistent with known injury.   X-ray with stool but no other concerning findings.   6:01 PM patient symptoms are controlled.  She has received a liter of fluid.  Nausea is better however she continues to have some mild left upper pain.  We will discharge home with Carafate and Vicodin.  [employer] reviewed.  Last narcotic prescription filled 01/11/2018.  She has been receiving intermittent prescriptions over the past year.  Patient counseled on use of narcotic pain medications. Counseled not to combine these medications with others containing tylenol. Urged not to drink alcohol, drive, or perform any other activities that requires focus while taking these medications. The patient verbalizes understanding and agrees with the plan.  Encouraged recheck next week by PCP.  The patient was urged to return to the Emergency Department immediately with worsening of current symptoms, worsening abdominal pain, persistent vomiting, blood noted in stools, fever, or any other concerns. The patient verbalized understanding.   Final Clinical Impressions(s) / ED Diagnoses   Final diagnoses:  Left upper quadrant pain  Malaise and fatigue   Patient with vague weakness and left upper quadrant abdominal pain.  She is on methotrexate for neurosarcoidosis.  No  vomiting.  Symptoms controlled in the ED.  Lab work is reassuring. Vitals are stable, no fever. Imaging with plain films shows some constipation which is no unusual for patient.  She had LLE DVT study that was negative. No signs of dehydration, patient is tolerating PO's. Lungs are clear and no signs suggestive of PNA. Low concern for appendicitis, cholecystitis, pancreatitis, ruptured viscus, UTI, kidney stone, aortic dissection, aortic aneurysm or other emergent abdominal etiology. Supportive therapy indicated with return if symptoms worsen.    ED Discharge Orders        Ordered    sucralfate (CARAFATE) 1 g tablet  3 times daily with meals & bedtime     04/24/18 1757    HYDROcodone-acetaminophen (NORCO/VICODIN) 5-325 MG tablet  Every 6 hours PRN     04/24/18 1757      Carlisle Cater, PA-C 04/24/18 1803  [place], Fredia Sorrow, MD 04/24/18 2316

## 2018-04-24 NOTE — Discharge Instructions (Signed)
Please read and follow all provided instructions.  Your diagnoses today include:  1. Left upper quadrant pain   2. Malaise and fatigue     Tests performed today include:  Blood counts and electrolytes  Blood tests to check liver and kidney function  Blood tests to check pancreas function  Urine test to look for infection and pregnancy (in women)  X-ray of your abdomen and chest - shows stool on the right side, otherwise okay  Vital signs. See below for your results today.   Medications prescribed:   Vicodin (hydrocodone/acetaminophen) - narcotic pain medication  DO NOT drive or perform any activities that require you to be awake and alert because this medicine can make you drowsy. BE VERY CAREFUL not to take multiple medicines containing Tylenol (also called acetaminophen). Doing so can lead to an overdose which can damage your liver and cause liver failure and possibly death.   Carafate - for stomach upset and to protect your stomach  Take any prescribed medications only as directed.  Home care instructions:   Follow any educational materials contained in this packet.  Follow-up instructions: Please follow-up with your primary care provider in the next 3-5 days for further evaluation of your symptoms.    Return instructions:  SEEK IMMEDIATE MEDICAL ATTENTION IF:  The pain does not go away or becomes severe   A temperature above 101F develops   Repeated vomiting occurs (multiple episodes)   The pain becomes localized to portions of the abdomen. The right side could possibly be appendicitis. In an adult, the left lower portion of the abdomen could be colitis or diverticulitis.   Blood is being passed in stools or vomit (bright red or black tarry stools)   You develop chest pain, difficulty breathing, dizziness or fainting, or become confused, poorly responsive, or inconsolable (young children)  If you have any other emergent concerns regarding your  health  Additional Information: Abdominal (belly) pain can be caused by many things. Your caregiver performed an examination and possibly ordered blood/urine tests and imaging (CT scan, x-rays, ultrasound). Many cases can be observed and treated at home after initial evaluation in the emergency department. Even though you are being discharged home, abdominal pain can be unpredictable. Therefore, you need a repeated exam if your pain does not resolve, returns, or worsens. Most patients with abdominal pain don't have to be admitted to the hospital or have surgery, but serious problems like appendicitis and gallbladder attacks can start out as nonspecific pain. Many abdominal conditions cannot be diagnosed in one visit, so follow-up evaluations are very important.  Your vital signs today were: BP (!) 156/87    Pulse 79    Temp 98.1 F (36.7 C) (Oral)    Resp (!) 21    LMP 12/03/2011    SpO2 100%  If your blood pressure (bp) was elevated above 135/85 this visit, please have this repeated by your doctor within one month. --------------

## 2018-04-24 NOTE — Progress Notes (Signed)
Preliminary notes--Left lower extremity venous duplex exam completed.  Negative for DVT.  Left ankle medial portion: complex fluid collection maybe due to torn tendon or ligament.   Result notified ordering provider Geiple Joshua(PA).  [name] [name] (RDMS RVT) 04/24/18 4:24 PM

## 2018-04-24 NOTE — ED Triage Notes (Signed)
Pt complaining of LUQ pain x3 days. She has a hx o f a brain tumor and states that she is currently on chemo. She also reports weakness and near syncope 2x in the last week. Denies N/V. Endorses diarrhea. L foot is in a cam walker. A&Ox4.

## 2018-04-24 NOTE — ED Notes (Signed)
Attempted IV x 1 with no succussed. With attempted again after ultrasound.

## 2018-04-27 ENCOUNTER — Telehealth: Payer: Self-pay | Admitting: *Deleted

## 2018-04-27 NOTE — Telephone Encounter (Signed)
I left the patient a message to give me a call back.  Dr. Milinda Pointer doesn't have anything available on June 14 for surgery.  I will offer her June 28.  "I just missed your call."  Yes, I was calling to reschedule your surgery.  Dr. Milinda Pointer doesn't have anything available on June 14.  He can do it on June 28.  Is this date okay?  "I have not choice.  I didn't want to wait so long because I'm in so much pain."  I will put you on a wait list.  If I have any cancellations, I will give you a call.  "I thought that this was how I got this appointment."  No, I am sorry.  I will give you a call if there are any cancellations.

## 2018-05-03 ENCOUNTER — Other Ambulatory Visit: Payer: Self-pay | Admitting: *Deleted

## 2018-05-03 DIAGNOSIS — E890 Postprocedural hypothyroidism: Secondary | ICD-10-CM

## 2018-05-04 MED ORDER — LEVOTHYROXINE SODIUM 150 MCG PO TABS: 150.0000 ug | ORAL_TABLET | Freq: Every day | ORAL | 0 refills | Status: DC

## 2018-05-06 ENCOUNTER — Other Ambulatory Visit: Payer: Self-pay | Admitting: Internal Medicine

## 2018-05-18 ENCOUNTER — Telehealth: Payer: Self-pay | Admitting: Neurology

## 2018-05-18 MED ORDER — HYDROCODONE-ACETAMINOPHEN 5-325 MG PO TABS: 1.0000 | ORAL_TABLET | Freq: Four times a day (QID) | ORAL | 0 refills | Status: DC | PRN

## 2018-05-18 MED ORDER — PREDNISONE 5 MG PO TABS: ORAL_TABLET | ORAL | 0 refills | Status: DC

## 2018-05-18 NOTE — Telephone Encounter (Signed)
Pt's had a HA for the past 2-3 days with no relief. It gets worse when lying down so she has been unable to sleep, she has pain in the neck also and is nauseated. She has taken ibuprofen 200mg  with no relief. Please call to advise

## 2018-05-18 NOTE — Telephone Encounter (Signed)
I called the patient.  The patient has a headache consistent with migraine, she has nausea, photophobia and phonophobia.  Headache has been severe for 2 to 3 days.  I will call in a prednisone Dosepak, and a small prescription for hydrocodone.  The patient has been getting opiate medications in the low dose from other doctors, recently got a prescription for 8 tablets in May.

## 2018-05-19 ENCOUNTER — Other Ambulatory Visit: Payer: Self-pay | Admitting: Neurology

## 2018-05-27 ENCOUNTER — Other Ambulatory Visit: Payer: Self-pay | Admitting: Podiatry

## 2018-05-27 MED ORDER — ONDANSETRON HCL 4 MG PO TABS: 4.0000 mg | ORAL_TABLET | Freq: Three times a day (TID) | ORAL | 0 refills | Status: DC | PRN

## 2018-05-27 MED ORDER — CEPHALEXIN 500 MG PO CAPS: 500.0000 mg | ORAL_CAPSULE | Freq: Three times a day (TID) | ORAL | 0 refills | Status: DC

## 2018-05-27 MED ORDER — OXYCODONE-ACETAMINOPHEN 10-325 MG PO TABS: 1.0000 | ORAL_TABLET | Freq: Four times a day (QID) | ORAL | 0 refills | Status: DC | PRN

## 2018-05-28 ENCOUNTER — Encounter: Payer: Self-pay | Admitting: Podiatry

## 2018-05-28 DIAGNOSIS — M66872 Spontaneous rupture of other tendons, left ankle and foot: Secondary | ICD-10-CM | POA: Diagnosis not present

## 2018-05-28 DIAGNOSIS — M722 Plantar fascial fibromatosis: Secondary | ICD-10-CM | POA: Diagnosis not present

## 2018-06-01 ENCOUNTER — Ambulatory Visit (INDEPENDENT_AMBULATORY_CARE_PROVIDER_SITE_OTHER): Payer: Medicaid Other

## 2018-06-01 ENCOUNTER — Ambulatory Visit (INDEPENDENT_AMBULATORY_CARE_PROVIDER_SITE_OTHER): Payer: Medicaid Other | Admitting: Podiatry

## 2018-06-01 VITALS — BP 130/84 | HR 113 | Temp 98.9°F

## 2018-06-01 DIAGNOSIS — M66872 Spontaneous rupture of other tendons, left ankle and foot: Secondary | ICD-10-CM

## 2018-06-01 DIAGNOSIS — Z9889 Other specified postprocedural states: Secondary | ICD-10-CM

## 2018-06-01 DIAGNOSIS — M722 Plantar fascial fibromatosis: Secondary | ICD-10-CM | POA: Diagnosis not present

## 2018-06-01 MED ORDER — OXYCODONE-ACETAMINOPHEN 10-325 MG PO TABS: 1.0000 | ORAL_TABLET | Freq: Four times a day (QID) | ORAL | 0 refills | Status: AC | PRN

## 2018-06-01 NOTE — Progress Notes (Signed)
She presents today for her first postop visit she is status post EPF and a repair of the posterior tibial tendon left.  States that she is in excruciating pain denies fever chills nausea vomiting muscle aches and pain other than the surgical site.  States that she has been having to take more of her pain medication.  Denies taking any type of anti-inflammatories.  She denies shortness of breath chest pain headache.  Objective: Vital signs are stable she is alert and oriented x3.  Normal sensation to the toes with good range of motion of the toes.  The cast is sort of tight around the proximal portion of the leg but more loose around the toes.  Radiographs taken today demonstrating metal anchor to the navicular tuberosity.  Assessment 4 days status post Kidner procedure and EPF with cast.  Plan: Bivalve the cast today discussed the use of anti-inflammatories Tylenol and pain medication for which I wrote another prescription and we will follow-up with her in 1 to 2 weeks for cast change.

## 2018-06-03 ENCOUNTER — Emergency Department (HOSPITAL_COMMUNITY): Payer: Medicaid Other

## 2018-06-03 ENCOUNTER — Emergency Department (HOSPITAL_COMMUNITY)
Admission: EM | Admit: 2018-06-03 | Discharge: 2018-06-03 | Disposition: A | Discharge status: Home / Self Care | Payer: Medicaid Other | Attending: Emergency Medicine | Admitting: Emergency Medicine

## 2018-06-03 DIAGNOSIS — R0602 Shortness of breath: Secondary | ICD-10-CM | POA: Insufficient documentation

## 2018-06-03 DIAGNOSIS — E119 Type 2 diabetes mellitus without complications: Secondary | ICD-10-CM | POA: Insufficient documentation

## 2018-06-03 DIAGNOSIS — I10 Essential (primary) hypertension: Secondary | ICD-10-CM | POA: Diagnosis not present

## 2018-06-03 DIAGNOSIS — Z79899 Other long term (current) drug therapy: Secondary | ICD-10-CM | POA: Diagnosis not present

## 2018-06-03 DIAGNOSIS — E039 Hypothyroidism, unspecified: Secondary | ICD-10-CM | POA: Diagnosis not present

## 2018-06-03 DIAGNOSIS — Z7984 Long term (current) use of oral hypoglycemic drugs: Secondary | ICD-10-CM | POA: Insufficient documentation

## 2018-06-03 DIAGNOSIS — Z7982 Long term (current) use of aspirin: Secondary | ICD-10-CM | POA: Insufficient documentation

## 2018-06-03 LAB — BASIC METABOLIC PANEL
ANION GAP: 9 (ref 5–15)
BUN: 10 mg/dL (ref 6–20)
CHLORIDE: 101 mmol/L (ref 98–111)
CO2: 27 mmol/L (ref 22–32)
CREATININE: 0.68 mg/dL (ref 0.44–1.00)
Calcium: 8.9 mg/dL (ref 8.9–10.3)
GFR calc non Af Amer: >60 mL/min (ref >60)
Glucose, Bld: 221 mg/dL — ABNORMAL HIGH (ref 70–99)
Potassium: 4.1 mmol/L (ref 3.5–5.1)
SODIUM: 137 mmol/L (ref 135–145)

## 2018-06-03 LAB — CBC WITH DIFFERENTIAL/PLATELET
Abs Immature Granulocytes: 0 10*3/uL (ref 0.0–0.1)
BASOS ABS: 0 10*3/uL (ref 0.0–0.1)
Basophils Relative: 1 %
EOS ABS: 0.2 10*3/uL (ref 0.0–0.7)
EOS PCT: 3 %
HCT: 38.2 % (ref 36.0–46.0)
Hemoglobin: 11.7 g/dL — ABNORMAL LOW (ref 12.0–15.0)
IMMATURE GRANULOCYTES: 0 %
LYMPHS ABS: 2.7 10*3/uL (ref 0.7–4.0)
Lymphocytes Relative: 43 %
MCH: 27.1 pg (ref 26.0–34.0)
MCHC: 30.6 g/dL (ref 30.0–36.0)
MCV: 88.4 fL (ref 78.0–100.0)
Monocytes Absolute: 0.4 10*3/uL (ref 0.1–1.0)
Monocytes Relative: 7 %
NEUTROS ABS: 3 10*3/uL (ref 1.7–7.7)
NEUTROS PCT: 46 %
Platelets: 368 10*3/uL (ref 150–400)
RBC: 4.32 MIL/uL (ref 3.87–5.11)
RDW: 14 % (ref 11.5–15.5)
WBC: 6.3 10*3/uL (ref 4.0–10.5)

## 2018-06-03 LAB — I-STAT TROPONIN, ED: TROPONIN I, POC: 0 ng/mL (ref 0.00–0.08)

## 2018-06-03 LAB — D-DIMER, QUANTITATIVE (NOT AT ARMC): D DIMER QUANT: 0.43 ug{FEU}/mL (ref 0.00–0.50)

## 2018-06-03 MED ORDER — ALBUTEROL SULFATE HFA 108 (90 BASE) MCG/ACT IN AERS
2.0000 | INHALATION_SPRAY | RESPIRATORY_TRACT | Status: DC | PRN
  Administered 2018-06-03: 2 via RESPIRATORY_TRACT
  Filled 2018-06-03: qty 6.7

## 2018-06-03 NOTE — ED Triage Notes (Signed)
Patient here post-op left left and c/o SOB x 2 days.

## 2018-06-03 NOTE — ED Provider Notes (Signed)
[facility] EMERGENCY DEPARTMENT Provider Note   CSN: [id number] Arrival date & time: 06/03/18  5465     History   Chief Complaint Chief Complaint  Patient presents with  . Shortness of Breath    HPI [name] [name] is a 52 y.o. female.  Patient is a 52 year old female with a history of diabetes, hypertension and sarcoidosis who presents with shortness of breath.  She had a recent foot surgery about a week ago.  Over the last 2 days she has had some shortness of breath.  She feels like she just cannot get in a full breath.  Its both at rest and on exertion.  She denies any associated chest pain or tightness.  She did have some pain in her left shoulder and upper back earlier but denies any currently.  No cough or chest congestion.  No fevers.  Her left lower leg is in a cast but she has not noticed any increased swelling of the part that is exposed from the cast.  She states that she used to use albuterol inhalers occasionally due to her sarcoidosis but she has not had them in over a year.     Past Medical History:  Diagnosis Date  . Anemia   . Arthritis    lumbar region- radiculopathy  . Cancer [facility]) 2013    Thyroid Ca-thyroidectomy  . Chronic fatigue   . DM (diabetes mellitus) ([place])   . Gait disturbance   . GERD (gastroesophageal reflux disease)   . Headache(784.0)   . HTN (hypertension)   . Hypothyroidism   . Nephrolithiasis   . Obesity   . OSA (obstructive sleep apnea)    last test- many yrs. ago, done at [facility], no CPAP in use  . Osteoporosis   . Peripheral neuropathy   . Prolonged QT interval   . 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 Rash 02/01/2018  . Ankle pain 12/16/2017  . Abnormal cervical Papanicolaou smear 04/17/2017  . Throat pain 04/17/2017  . Obesity 11/24/2016  . Herniated lumbar disc without myelopathy 08/20/2016  . Healthcare maintenance 02/18/2016    . Erythema nodosum 02/01/2016  . Insomnia 01/02/2015  . Intractable migraine without aura 02/09/2013  . Intermittent left-sided chest pain 01/26/2013  . Hypothyroidism 05/08/2011  . FIBROIDS, UTERUS 11/06/2010  . Abdominal pain 06/25/2010  . HYPERLIPIDEMIA 01/28/2010  . Sciatica of left side associated with disorder of lumbar spine 10/20/2007  . 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 Diabetes mellitus type 2, controlled ([place]) 05/25/2007  . OBSTRUCTIVE SLEEP APNEA 05/25/2007  . Essential hypertension 05/25/2007  . Gastroesophageal reflux disease 05/25/2007  . History of osteoporosis 05/25/2007  . LONG QT SYNDROME 05/25/2007    Past Surgical History:  Procedure Laterality Date  . BRAIN SURGERY  2001   for sarcoidosis  . BREAST BIOPSY Left   . BREAST BIOPSY Right   . BREAST EXCISIONAL BIOPSY Right    x2 benign  . BREAST EXCISIONAL BIOPSY Left    benign  . BREAST SURGERY Bilateral    biopsies   . DILATION AND CURETTAGE OF UTERUS    . HYSTEROSCOPY    . LUMBAR LAMINECTOMY/DECOMPRESSION MICRODISCECTOMY Left 08/20/2016   Procedure: Left Lumbar four-five Microdiskectomy;  Surgeon: Leeroy Cha, MD;  Location: [facility];  Service: Neurosurgery;  Laterality: Left;  . PARTIAL HYSTERECTOMY  2013  . ROTATOR CUFF REPAIR Right    07/2013  .  Suboccipital craniotomy    . THYROIDECTOMY     Precancerous lesion     OB History    Gravida  5   Para  4   Term      Preterm      AB      Living  4     SAB      TAB      Ectopic      Multiple      Live Births               Home Medications    Prior to Admission medications   Medication Sig Start Date End Date Taking? Authorizing Provider  acetaminophen (TYLENOL) 500 MG tablet Take 500 mg by mouth every 6 (six) hours as needed for headache.   Yes [provider]  aspirin 81 MG chewable tablet Chew 81 mg by mouth daily.   Yes [provider]  atorvastatin (LIPITOR) 20 MG tablet TAKE 1 TABLET BY  MOUTH EVERY DAY IN THE EVENING 04/12/18  Yes Rice, Resa Miner, MD  cephALEXin (KEFLEX) 500 MG capsule Take 1 capsule (500 mg total) by mouth 3 (three) times daily. 05/27/18  Yes Hyatt, Max T, DPM  folic acid (FOLVITE) 1 MG tablet TAKE 1 TABLET BY MOUTH EVERY DAY 04/07/18  Yes Rice, Resa Miner, MD  HYDROcodone-acetaminophen (NORCO/VICODIN) 5-325 MG tablet Take 1-2 tablets by mouth every 6 (six) hours as needed for severe pain. 05/18/18  Yes Kathrynn Ducking, MD  ibuprofen (ADVIL,MOTRIN) 200 MG tablet Take 400 mg every 6 (six) hours as needed by mouth for headache.   Yes [provider]  levothyroxine (SYNTHROID, LEVOTHROID) 150 MCG tablet Take 1 tablet (150 mcg total) by mouth daily before breakfast. 05/04/18  Yes Rice, Resa Miner, MD  losartan (COZAAR) 50 MG tablet Take 1 tablet (50 mg total) by mouth daily. 08/20/17  Yes Rice, Resa Miner, MD  metFORMIN (GLUCOPHAGE) 1000 MG tablet TAKE 1 TABLET BY MOUTH TWICE A DAY WITH A MEAL 05/07/18  Yes Rice, Resa Miner, MD  methotrexate (RHEUMATREX) 2.5 MG tablet TAKE 4 TABLETS (10 MG TOTAL) BY MOUTH ONCE A WEEK. CAUTION:CHEMOTHERAPY. PROTECT FROM LIGHT. 05/19/18  Yes Kathrynn Ducking, MD  Multiple Vitamin (MULTIVITAMIN WITH MINERALS) TABS tablet Take 1 tablet by mouth every evening.    Yes [provider]  ondansetron (ZOFRAN) 4 MG tablet Take 1 tablet (4 mg total) by mouth every 8 (eight) hours as needed for nausea or vomiting. 05/27/18  Yes Hyatt, Max T, DPM  oxyCODONE-acetaminophen (PERCOCET) 10-325 MG tablet Take 1 tablet by mouth every 6 (six) hours as needed for up to 7 days for pain. 06/01/18 06/08/18 Yes Hyatt, Max T, DPM  pantoprazole (PROTONIX) 20 MG tablet TAKE 1 TABLET BY MOUTH EVERY DAY 01/01/18  Yes Rice, Resa Miner, MD  ondansetron (ZOFRAN ODT) 4 MG disintegrating tablet Take 1 tablet (4 mg total) by mouth every 8 (eight) hours as needed for nausea or vomiting. Patient not taking: Reported on 06/03/2018 04/24/18   Carlisle Cater,  PA-C  predniSONE (DELTASONE) 5 MG tablet Begin taking 6 tablets daily, taper by one tablet daily until off the medication. Patient not taking: Reported on 06/03/2018 05/18/18   Kathrynn Ducking, MD  sucralfate (CARAFATE) 1 g tablet Take 1 tablet (1 g total) by mouth 4 (four) times daily -  with meals and at bedtime. Patient not taking: Reported on 06/03/2018 04/24/18   Carlisle Cater, PA-C    Family History Family  History  Problem Relation Age of Onset  . Heart disease Mother        questionable CAD and arrythmias   . Cancer Mother        Breast cancer  . Venous thrombosis Sister   . Diabetes Sister   . Heart disease Sister   . Diabetes Brother   . Heart disease Brother   . Cancer Maternal Aunt   . Venous thrombosis Sister   . Diabetes Sister   . Heart disease Sister     Social History Social History   Tobacco Use  . Smoking status: Never Smoker  . Smokeless tobacco: Never Used  Substance Use Topics  . Alcohol use: No    Alcohol/week: 0.0 oz  . Drug use: No     Allergies   Patient has no known allergies.   Review of Systems Review of Systems  Constitutional: Negative for chills, diaphoresis, fatigue and fever.  HENT: Negative for congestion, rhinorrhea and sneezing.   Eyes: Negative.   Respiratory: Positive for shortness of breath. Negative for cough and chest tightness.   Cardiovascular: Negative for chest pain and leg swelling.  Gastrointestinal: Negative for abdominal pain, blood in stool, diarrhea, nausea and vomiting.  Genitourinary: Negative for difficulty urinating, flank pain, frequency and hematuria.  Musculoskeletal: Positive for arthralgias. Negative for back pain.  Skin: Negative for rash.  Neurological: Negative for dizziness, speech difficulty, weakness, numbness and headaches.     Physical Exam Updated Vital Signs BP (!) 149/77   Pulse 89   Temp 98.5 F (36.9 C) (Oral)   Resp 12   LMP 12/03/2011   SpO2 100%   Physical Exam  Constitutional:  She is oriented to person, place, and time. She appears well-developed and well-nourished.  HENT:  Head: Normocephalic and atraumatic.  Eyes: Pupils are equal, round, and reactive to light.  Neck: Normal range of motion. Neck supple.  Cardiovascular: Normal rate, regular rhythm and normal heart sounds.  Pulmonary/Chest: Effort normal and breath sounds normal. No respiratory distress. She has no wheezes. She has no rales. She exhibits no tenderness.  Abdominal: Soft. Bowel sounds are normal. There is no tenderness. There is no rebound and no guarding.  Musculoskeletal: Normal range of motion. She exhibits no edema.  Patient's left lower extremity is in a leg cast.  There is no swelling above the cast or noticeable swelling to the toes.  Capillary refill is less than 2 distally.  Lymphadenopathy:    She has no cervical adenopathy.  Neurological: She is alert and oriented to person, place, and time.  Skin: Skin is warm and dry. No rash noted.  Psychiatric: She has a normal mood and affect.     ED Treatments / Results  Labs (all labs ordered are listed, but only abnormal results are displayed) Labs Reviewed  BASIC METABOLIC PANEL - Abnormal; Notable for the following components:      Result Value   Glucose, Bld 221 (*)    All other components within normal limits  CBC WITH DIFFERENTIAL/PLATELET - Abnormal; Notable for the following components:   Hemoglobin 11.7 (*)    All other components within normal limits  D-DIMER, QUANTITATIVE (NOT AT [facility])  I-STAT TROPONIN, ED    EKG EKG Interpretation  Date/Time:  Thursday June 03 2018 07:55:25 EDT Ventricular Rate:  79 PR Interval:    QRS Duration: 93 QT Interval:  391 QTC Calculation: 449 R Axis:   -5 Text Interpretation:  Sinus rhythm Abnormal R-wave progression, early transition  LVH by voltage Borderline T abnormalities, diffuse leads since last tracing no significant change Confirmed by Malvin Johns [phone]) on 06/03/2018 8:04:58 AM  Also confirmed by Malvin Johns [phone]), editor Laurena Spies [phone])  on 06/03/2018 9:41:09 AM   Radiology Dg Chest 2 View  Result Date: 06/03/2018 CLINICAL DATA:  Chest pain and shortness of breath following recent foot surgery EXAM: CHEST - 2 VIEW COMPARISON:  04/24/2018 FINDINGS: The heart size and mediastinal contours are within normal limits. Both lungs are clear. The visualized skeletal structures are unremarkable. IMPRESSION: No active cardiopulmonary disease. Electronically Signed   By: Inez Catalina M.D.   On: 06/03/2018 07:42   Dg Foot Complete Left  Result Date: 06/01/2018 Please see detailed radiograph report in office note.   Procedures Procedures (including critical care time)  Medications Ordered in ED Medications  albuterol (PROVENTIL HFA;VENTOLIN HFA) 108 (90 Base) MCG/ACT inhaler 2 puff (2 puffs Inhalation Given 06/03/18 0751)     Initial Impression / Assessment and Plan / ED Course  I have reviewed the triage vital signs and the nursing notes.  Pertinent labs & imaging results that were available during my care of the patient were reviewed by me and considered in my medical decision making (see chart for details).     Patient is a 52 year old female who presents with shortness of breath.  She has no associated chest pain.  Her EKG does not show any ischemic changes.  Her troponin is negative.  She had a negative d-dimer and no other suggestions of pulmonary embolus.  Chest x-ray is clear without evidence of pneumonia.  She does report that she has had to use an inhaler in the past.  She was given albuterol inhaler today and feels much better after this.  She feels like she is back to baseline.  She has no tachypnea or increased work of breathing.  No hypoxia.  She was discharged home in good condition.  She was dispensed an inhaler to use every 4-6 hours for the next few days.  She was encouraged to follow-up with her PCP at the internal medicine center.  Return  precautions were given.  Final Clinical Impressions(s) / ED Diagnoses   Final diagnoses:  SOB (shortness of breath)    ED Discharge Orders    None       Malvin Johns, MD 06/03/18 1032

## 2018-06-03 NOTE — ED Notes (Signed)
Patient transported to X-ray 

## 2018-06-09 ENCOUNTER — Other Ambulatory Visit: Payer: Self-pay | Admitting: Internal Medicine

## 2018-06-10 ENCOUNTER — Telehealth: Payer: Self-pay | Admitting: *Deleted

## 2018-06-10 ENCOUNTER — Ambulatory Visit (INDEPENDENT_AMBULATORY_CARE_PROVIDER_SITE_OTHER): Payer: Self-pay

## 2018-06-10 ENCOUNTER — Ambulatory Visit (INDEPENDENT_AMBULATORY_CARE_PROVIDER_SITE_OTHER): Payer: Self-pay | Admitting: Podiatry

## 2018-06-10 ENCOUNTER — Ambulatory Visit (HOSPITAL_COMMUNITY)
Admission: RE | Admit: 2018-06-10 | Discharge: 2018-06-10 | Disposition: A | Discharge status: Home / Self Care | Payer: Medicaid Other | Source: Ambulatory Visit | Attending: Podiatry | Admitting: Podiatry

## 2018-06-10 DIAGNOSIS — R609 Edema, unspecified: Secondary | ICD-10-CM | POA: Insufficient documentation

## 2018-06-10 DIAGNOSIS — M722 Plantar fascial fibromatosis: Secondary | ICD-10-CM

## 2018-06-10 DIAGNOSIS — Z9889 Other specified postprocedural states: Secondary | ICD-10-CM

## 2018-06-10 DIAGNOSIS — M66872 Spontaneous rupture of other tendons, left ankle and foot: Secondary | ICD-10-CM

## 2018-06-10 DIAGNOSIS — M79605 Pain in left leg: Secondary | ICD-10-CM | POA: Insufficient documentation

## 2018-06-10 MED ORDER — HYDROCODONE-ACETAMINOPHEN 5-325 MG PO TABS: 1.0000 | ORAL_TABLET | Freq: Four times a day (QID) | ORAL | 0 refills | Status: DC | PRN

## 2018-06-10 NOTE — Telephone Encounter (Signed)
[facility] AUTHORIZED VENOUS DOPPLER, AUTHORIZATION NUMBER:  [id number], VALID 06/10/2018 END DATE 07/10/2018.

## 2018-06-10 NOTE — Telephone Encounter (Signed)
Dr. Jacqualyn Posey ordered venous doppler for swelling and pain in limb, post op. Left message [place] concerning orders and that I would inform pt of their office [phone] and look for her call. I gave pt my card with [facility] phone. Faxed orders to [facility].

## 2018-06-11 ENCOUNTER — Telehealth: Payer: Self-pay | Admitting: *Deleted

## 2018-06-11 NOTE — Telephone Encounter (Signed)
-----   Message from Trula Slade, DPM sent at 06/10/2018  5:22 PM EDT ----- Negative for DVT- please let her know. Thanks.

## 2018-06-11 NOTE — Telephone Encounter (Signed)
Left message informing pt, due to the importance and her concern I would leave the results of her venous doppler on the voicemail, she is negative for DVT there is not blood clot.

## 2018-06-11 NOTE — Progress Notes (Signed)
Patient presents for cast application. Below the knee cast was applied.

## 2018-06-13 NOTE — Progress Notes (Signed)
Subjective: [name] [name] is a 52 y.o. is seen today in office s/p left posterior tibial tendon repair as well as endoscopic plantar fascial release performed by Dr. Milinda Pointer.  She is been in the cast and she states that she is done well and her pain is improving.  She did have to the emergency room for shortness of breath.  She does complain of some calf pain.  She did have d-dimer performed in the emergency room which was normal.   Denies any systemic complaints such as fevers, chills, nausea, vomiting. No calf pain, chest pain, shortness of breath.   Objective: General: No acute distress, AAOx3  DP/PT pulses palpable 2/4, CRT < 3 sec to all digits.  Protective sensation intact. Motor function intact.  LEFT foot: Incision is well coapted without any evidence of dehiscence and staples, sutures are intact. There is no surrounding erythema, ascending cellulitis, fluctuance, crepitus, malodor, drainage/purulence. There is mild edema around the surgical site. There is mild pain along the surgical site.  Incisions appear to be healing well without any issues there is no signs of infection.  There is some pain with calf compression however the calf is supple.  There is no edema, erythema. No other areas of tenderness to bilateral lower extremities.  No other open lesions or pre-ulcerative lesions.    Assessment and Plan:  Status post left foot surgery, doing well with no complications   -Treatment options discussed including all alternatives, risks, and complications -Given she is having calf pain although her d-dimer and everything was normal in the emergency room with negative venous duplex to rule out a DVT.  Because of that antibiotic ointment and a bandage was applied followed by a posterior splint.  She does not come back to the office after the duplex to have a cast reapplied. -Continue nonweightbearing. -Ice/elevation -Pain medication as needed. -Monitor for any clinical signs or symptoms of  infection and DVT/PE and directed to call the office immediately should any occur or go to the ER. -Follow-up as scheduled with Dr. Milinda Pointer or sooner if any problems arise. In the meantime, encouraged to call the office with any questions, concerns, change in symptoms.   Celesta Gentile, DPM

## 2018-06-23 ENCOUNTER — Encounter: Payer: Self-pay | Admitting: *Deleted

## 2018-06-24 ENCOUNTER — Encounter: Payer: Self-pay | Admitting: Podiatry

## 2018-06-24 ENCOUNTER — Ambulatory Visit (INDEPENDENT_AMBULATORY_CARE_PROVIDER_SITE_OTHER): Payer: Medicaid Other | Admitting: Podiatry

## 2018-06-24 DIAGNOSIS — M722 Plantar fascial fibromatosis: Secondary | ICD-10-CM

## 2018-06-24 DIAGNOSIS — M66872 Spontaneous rupture of other tendons, left ankle and foot: Secondary | ICD-10-CM | POA: Diagnosis not present

## 2018-06-24 DIAGNOSIS — Z9889 Other specified postprocedural states: Secondary | ICD-10-CM

## 2018-06-24 MED ORDER — HYDROCODONE-ACETAMINOPHEN 5-325 MG PO TABS: 1.0000 | ORAL_TABLET | Freq: Four times a day (QID) | ORAL | 0 refills | Status: DC | PRN

## 2018-06-24 NOTE — Progress Notes (Signed)
She presents today date of surgery 05/28/2018 status post endoscopic plantar fasciotomy and major repair of her posterior tibial tendon with Kidner procedure and cast on the left foot.  She states that is still quite painful and can sleep at night.  She is start sharp stabbing pains.  She denies fever chills nausea vomiting muscle aches and pains other than pain in her left foot.  Subjective: Vital signs are stable alert and oriented x3 pulses are palpable.  Sutures are intact staples are intact and were removed.  She has good dorsiflexion plantarflexion inversion and eversion.  Assessment: Well-healing posterior tibial tendon repair.  Plan: Place her in a cam walker and a compression anklet.  We will follow-up with her in 2 weeks.  May need to send her to physical therapy and start partial weightbearing at that time.

## 2018-07-01 ENCOUNTER — Ambulatory Visit: Payer: Medicaid Other | Admitting: Neurology

## 2018-07-01 ENCOUNTER — Encounter

## 2018-07-08 ENCOUNTER — Ambulatory Visit (INDEPENDENT_AMBULATORY_CARE_PROVIDER_SITE_OTHER): Payer: Medicaid Other | Admitting: Podiatry

## 2018-07-08 ENCOUNTER — Encounter: Payer: Self-pay | Admitting: Podiatry

## 2018-07-08 DIAGNOSIS — Z9889 Other specified postprocedural states: Secondary | ICD-10-CM

## 2018-07-08 DIAGNOSIS — M722 Plantar fascial fibromatosis: Secondary | ICD-10-CM

## 2018-07-08 DIAGNOSIS — M66872 Spontaneous rupture of other tendons, left ankle and foot: Secondary | ICD-10-CM

## 2018-07-08 MED ORDER — OXYCODONE-ACETAMINOPHEN 10-325 MG PO TABS: 1.0000 | ORAL_TABLET | ORAL | 0 refills | Status: AC | PRN

## 2018-07-08 NOTE — Progress Notes (Signed)
She presents today for postop visit date of surgery 05/28/2018 status post EPF and posterior tibial tendon repair with Kidner advancement.  She states that I am in so much pain my calf is hurting the leg is hurting all the way up to my hip.  She denies fever chills nausea vomitus aches and pains states that the medication I gave her last time did not help at all.  She states that her calf has not been swollen or painful or red and hot.  It is just sore and she thinks is probably from using the scooter.  She also has a knot on the anterior knee from using the scooter.  Objective: Vital signs are stable she is alert and oriented x3 pulses are palpable moderate edema to the left lower extremity that much improved from previous evaluation.  Calf is supple it is not hot to touch and is nontender.  She has a nodule with a the patellar tendon insertion or the tibial tubercle.  Assessment: She cannot take steroids so I encouraged her to start ibuprofen 800 mg 3 times a day with food also wrote a prescription for Percocet also going to get her into physical therapy and I will have her start utilizing her foot to bear weight on.  On follow-up with her in a couple weeks

## 2018-07-09 ENCOUNTER — Telehealth: Payer: Self-pay | Admitting: *Deleted

## 2018-07-09 DIAGNOSIS — Z9889 Other specified postprocedural states: Secondary | ICD-10-CM

## 2018-07-09 NOTE — Telephone Encounter (Signed)
-----   Message from Rip Harbour, [facility] sent at 07/08/2018  4:32 PM EDT ----- Regarding: PT [facility] - in house  S/p posterior tibial tendon repair left   Duration: 3 x week x 4 weeks  ASAP!!

## 2018-07-14 ENCOUNTER — Encounter: Payer: Self-pay | Admitting: Neurology

## 2018-07-14 ENCOUNTER — Ambulatory Visit: Payer: Medicaid Other | Admitting: Neurology

## 2018-07-14 ENCOUNTER — Encounter

## 2018-07-14 VITALS — BP 139/85 | HR 83 | Ht 68.0 in | Wt 222.0 lb

## 2018-07-14 DIAGNOSIS — G43019 Migraine without aura, intractable, without status migrainosus: Secondary | ICD-10-CM

## 2018-07-14 DIAGNOSIS — D8689 Sarcoidosis of other sites: Secondary | ICD-10-CM | POA: Diagnosis not present

## 2018-07-14 DIAGNOSIS — Z5181 Encounter for therapeutic drug level monitoring: Secondary | ICD-10-CM | POA: Diagnosis not present

## 2018-07-14 MED ORDER — PREGABALIN 50 MG PO CAPS: ORAL_CAPSULE | ORAL | 3 refills | Status: DC

## 2018-07-14 NOTE — Patient Instructions (Signed)
We will star Lyrica 50 mg in the morning and one in the evening for 2 weeks, then take one in the morning and 2 in the evening.  Lyrica (pregabalin) may cause drowsiness, gait instability, ankle swelling, or cognitive slowing as well as possible dizziness. If any significant side effects occur associated with this medication, please contact our office.

## 2018-07-14 NOTE — Progress Notes (Signed)
Reason for visit: Headache, sarcoidosis  [name] [name] is an 52 y.o. female  History of present illness:  [name] [name] is a 52 year old right-handed black female with a history of neurosarcoidosis, and intractable migraine headache.  The patient has low back pain issues even following lumbosacral spine surgery.  She continues to have pain in the left hip and down the left leg to the foot.  She is being treated for a partial tendon tear of the tibialis anterior muscle, she is in a brace of the left foot and ankle.  The patient is not able to sleep well at night in part because of the back pain.  She has pain around the right side from the back to the umbilicus that is worse when she is trying to have a bowel movement.  She has chronic nausea when she tries to eat.  She has been seen through gastroenterology.  She has a lot of pain when she has a bowel movement.  She continues to have frequent headaches, 3 to 4 days a week.  She was given a trial on Cymbalta, this offered no benefit, she stopped the medication.  The patient returns to the office today for an evaluation.  Past Medical History:  Diagnosis Date  . Anemia   . Arthritis    lumbar region- radiculopathy  . Cancer [facility]) 2013    Thyroid Ca-thyroidectomy  . Chronic fatigue   . DM (diabetes mellitus) ([place])   . Gait disturbance   . GERD (gastroesophageal reflux disease)   . Headache(784.0)   . HTN (hypertension)   . Hypothyroidism   . Nephrolithiasis   . Obesity   . OSA (obstructive sleep apnea)    last test- many yrs. ago, done at [facility], no CPAP in use  . Osteoporosis   . Peripheral neuropathy   . Prolonged QT interval   . 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 BRAIN SURGERY  2001   for sarcoidosis  . BREAST BIOPSY Left   . BREAST BIOPSY Right   . BREAST EXCISIONAL BIOPSY Right    x2 benign  . BREAST EXCISIONAL BIOPSY Left    benign  .  BREAST SURGERY Bilateral    biopsies   . DILATION AND CURETTAGE OF UTERUS    . HYSTEROSCOPY    . LUMBAR LAMINECTOMY/DECOMPRESSION MICRODISCECTOMY Left 08/20/2016   Procedure: Left Lumbar four-five Microdiskectomy;  Surgeon: Leeroy Cha, MD;  Location: [place] [facility];  Service: Neurosurgery;  Laterality: Left;  . PARTIAL HYSTERECTOMY  2013  . ROTATOR CUFF REPAIR Right    07/2013  . Suboccipital craniotomy    . THYROIDECTOMY     Precancerous lesion    Family History  Problem Relation Age of Onset  . Heart disease Mother        questionable CAD and arrythmias   . Cancer Mother        Breast cancer  . Venous thrombosis Sister   . Diabetes Sister   . Heart disease Sister   . Diabetes Brother   . Heart disease Brother   . Cancer Maternal Aunt   . Venous thrombosis Sister   . Diabetes Sister   . Heart disease Sister     Social history:  reports that she has never smoked. She has never used smokeless tobacco. She reports that she does not drink alcohol or use drugs.   No Known Allergies  Medications:  Prior to Admission medications   Medication Sig Start Date End Date Taking? Authorizing Provider  acetaminophen (TYLENOL) 500 MG tablet Take 500 mg by mouth every 6 (six) hours as needed for headache.   Yes [provider]  aspirin 81 MG chewable tablet Chew 81 mg by mouth daily.   Yes [provider]  atorvastatin (LIPITOR) 20 MG tablet TAKE 1 TABLET BY MOUTH EVERY DAY IN THE EVENING 04/12/18  Yes Rice, Resa Miner, MD  folic acid (FOLVITE) 1 MG tablet TAKE 1 TABLET BY MOUTH EVERY DAY 04/07/18  Yes Rice, Resa Miner, MD  HYDROcodone-acetaminophen (NORCO/VICODIN) 5-325 MG tablet Take 1-2 tablets by mouth every 6 (six) hours as needed for severe pain. 06/24/18  Yes Hyatt, Max T, DPM  levothyroxine (SYNTHROID, LEVOTHROID) 150 MCG tablet Take 1 tablet (150 mcg total) by mouth daily before breakfast. 05/04/18  Yes Rice, Resa Miner, MD  losartan (COZAAR) 50 MG tablet  Take 1 tablet (50 mg total) by mouth daily. 08/20/17  Yes Rice, Resa Miner, MD  metFORMIN (GLUCOPHAGE) 1000 MG tablet TAKE 1 TABLET BY MOUTH TWICE A DAY WITH A MEAL 05/07/18  Yes Rice, Resa Miner, MD  methotrexate (RHEUMATREX) 2.5 MG tablet TAKE 4 TABLETS (10 MG TOTAL) BY MOUTH ONCE A WEEK. CAUTION:CHEMOTHERAPY. PROTECT FROM LIGHT. 05/19/18  Yes Kathrynn Ducking, MD  Multiple Vitamin (MULTIVITAMIN WITH MINERALS) TABS tablet Take 1 tablet by mouth every evening.    Yes [provider]  ondansetron (ZOFRAN ODT) 4 MG disintegrating tablet Take 1 tablet (4 mg total) by mouth every 8 (eight) hours as needed for nausea or vomiting. 04/24/18  Yes Carlisle Cater, PA-C  ondansetron (ZOFRAN) 4 MG tablet Take 1 tablet (4 mg total) by mouth every 8 (eight) hours as needed for nausea or vomiting. 05/27/18  Yes Hyatt, Max T, DPM  pantoprazole (PROTONIX) 20 MG tablet TAKE 1 TABLET BY MOUTH EVERY DAY 06/09/18  Yes Axel Filler, MD  sucralfate (CARAFATE) 1 g tablet Take 1 tablet (1 g total) by mouth 4 (four) times daily -  with meals and at bedtime. 04/24/18  Yes Carlisle Cater, PA-C  pregabalin (LYRICA) 50 MG capsule One capsule in the morning and 2 in the evening 07/14/18   Kathrynn Ducking, MD    ROS:  Out of a complete 14 system review of symptoms, the patient complains only of the following symptoms, and all other reviewed systems are negative.  Fatigue Abdominal pain, nausea Frequent waking, daytime sleepiness Back pain Headache  Blood pressure 139/85, pulse 83, height 5\' 8"  (1.727 m), weight 222 lb (100.7 kg), last menstrual period 12/03/2011.  Physical Exam  General: The patient is alert and cooperative at the time of the examination.  The patient is moderately obese.  Neuromuscular: Range move the cervical spine is full.  Skin: No significant peripheral edema is noted.   Neurologic Exam  Mental status: The patient is alert and oriented x 3 at the time of the examination.  The patient has apparent normal recent and remote memory, with an apparently normal attention span and concentration ability.   Cranial nerves: Facial symmetry is present. Speech is normal, no aphasia or dysarthria is noted. Extraocular movements are full. Visual fields are full.  Motor: The patient has good strength in all 4 extremities.  Sensory examination: Soft touch sensation is symmetric on the face, arms, and legs.  Coordination: The patient has good finger-nose-finger and heel-to-shin bilaterally.  Gait and station: The patient has a normal gait. Romberg  is negative, but is unsteady. No drift is seen.  Reflexes: Deep tendon reflexes are symmetric.   Assessment/Plan:  1.  Neurosarcoidosis  2.  Intractable migraine headache  3.  Chronic low back pain, left leg pain  The patient will be given a trial on Lyrica starting at 50 mg twice daily for 2 weeks and go to 50 mg in the morning and 100 mg in the evening.  She will call for any dose adjustments.  Hopefully this will help her headaches, neck and back pain.  The patient will follow-up in 6 months.  Jill Alexanders MD 07/14/2018 11:02 AM  [facility] [address] [place], [place] [address]  Phone [phone] Fax [phone]

## 2018-07-15 LAB — COMPREHENSIVE METABOLIC PANEL
ALBUMIN: 4.4 g/dL (ref 3.5–5.5)
ALT: 19 IU/L (ref 0–32)
AST: 16 IU/L (ref 0–40)
Albumin/Globulin Ratio: 1.8 (ref 1.2–2.2)
Alkaline Phosphatase: 89 IU/L (ref 39–117)
BUN / CREAT RATIO: 18 (ref 9–23)
BUN: 13 mg/dL (ref 6–24)
Bilirubin Total: <0.2 mg/dL (ref 0.0–1.2)
CALCIUM: 9.3 mg/dL (ref 8.7–10.2)
CO2: 24 mmol/L (ref 20–29)
Chloride: 104 mmol/L (ref 96–106)
Creatinine, Ser: 0.71 mg/dL (ref 0.57–1.00)
GFR, EST AFRICAN AMERICAN: 115 mL/min/{1.73_m2} (ref >59)
GFR, EST NON AFRICAN AMERICAN: 100 mL/min/{1.73_m2} (ref >59)
GLUCOSE: 170 mg/dL — AB (ref 65–99)
Globulin, Total: 2.5 g/dL (ref 1.5–4.5)
Potassium: 4.6 mmol/L (ref 3.5–5.2)
Sodium: 143 mmol/L (ref 134–144)
TOTAL PROTEIN: 6.9 g/dL (ref 6.0–8.5)

## 2018-07-15 LAB — CBC WITH DIFFERENTIAL/PLATELET
BASOS ABS: 0 10*3/uL (ref 0.0–0.2)
Basos: 0 %
EOS (ABSOLUTE): 0.1 10*3/uL (ref 0.0–0.4)
Eos: 2 %
Hematocrit: 35.7 % (ref 34.0–46.6)
Hemoglobin: 11.3 g/dL (ref 11.1–15.9)
IMMATURE GRANS (ABS): 0 10*3/uL (ref 0.0–0.1)
Immature Granulocytes: 0 %
LYMPHS: 40 %
Lymphocytes Absolute: 2 10*3/uL (ref 0.7–3.1)
MCH: 27.2 pg (ref 26.6–33.0)
MCHC: 31.7 g/dL (ref 31.5–35.7)
MCV: 86 fL (ref 79–97)
Monocytes Absolute: 0.3 10*3/uL (ref 0.1–0.9)
Monocytes: 6 %
NEUTROS ABS: 2.5 10*3/uL (ref 1.4–7.0)
Neutrophils: 52 %
Platelets: 285 10*3/uL (ref 150–450)
RBC: 4.16 x10E6/uL (ref 3.77–5.28)
RDW: 15.8 % — ABNORMAL HIGH (ref 12.3–15.4)
WBC: 4.9 10*3/uL (ref 3.4–10.8)

## 2018-07-19 ENCOUNTER — Telehealth: Payer: Self-pay | Admitting: *Deleted

## 2018-07-19 NOTE — Telephone Encounter (Signed)
PA approved from 07/15/18-07/10/19. PA #[id number].

## 2018-07-27 ENCOUNTER — Other Ambulatory Visit: Payer: Self-pay | Admitting: Internal Medicine

## 2018-07-27 DIAGNOSIS — E890 Postprocedural hypothyroidism: Secondary | ICD-10-CM

## 2018-08-06 ENCOUNTER — Telehealth: Payer: Self-pay | Admitting: Podiatry

## 2018-08-06 ENCOUNTER — Encounter: Payer: Self-pay | Admitting: Podiatry

## 2018-08-06 ENCOUNTER — Ambulatory Visit (INDEPENDENT_AMBULATORY_CARE_PROVIDER_SITE_OTHER): Payer: Medicaid Other | Admitting: Podiatry

## 2018-08-06 DIAGNOSIS — M79662 Pain in left lower leg: Secondary | ICD-10-CM

## 2018-08-06 DIAGNOSIS — R609 Edema, unspecified: Secondary | ICD-10-CM

## 2018-08-06 DIAGNOSIS — Z9889 Other specified postprocedural states: Secondary | ICD-10-CM

## 2018-08-06 MED ORDER — TRAMADOL HCL 50 MG PO TABS: 50.0000 mg | ORAL_TABLET | Freq: Three times a day (TID) | ORAL | 0 refills | Status: DC | PRN

## 2018-08-06 NOTE — Telephone Encounter (Signed)
Pt states she has not received a call from the PT.

## 2018-08-06 NOTE — Telephone Encounter (Addendum)
I spoke with DR. Wagoner and he stated get pt in to be evaluated for DVT or have her go to the ED. I told pt and she states she can get in around 4:15pm and I informed Dr. March Rummage by note on pt's last office visit note.

## 2018-08-06 NOTE — Telephone Encounter (Signed)
Pt states she is having pain and swelling in her foot and leg.

## 2018-08-06 NOTE — Telephone Encounter (Signed)
I spoke to pt and informed that she could contact the [facility] PT [phone]. Pt states she is having foot and leg pain and swelling with bruising to the ankle. Pt states the calf gets hard and hot during the day when she is walking in the boot, but goes down when she rest at night, but the pain makes it difficult to get to sleep. I asked the pt if she felt all the fabric from the boot was making her calf hot and she said no. I told her I needed to see if I needed to send her for testing for a blood clot. Pt states she was tested negative for a blood clot over a month ago. I told pt I would need to see what her doctor wanted to do and call again with orders.

## 2018-08-06 NOTE — Progress Notes (Signed)
Subjective:  Patient ID: [name] [name], female    DOB: 08/27/1966,  MRN: [id number]  Chief Complaint  Patient presents with  . Routine Post Op    i had surgery on the left heel 05-28-18 and it hurts going from the arch to the leg   DOS: 05/28/18 Procedure: Posterior Tibial Tendon Repair, Kidner Advancement  52 y.o. female returns for post-op check. Reports pain in the left calf. Reports throbbing pain in the leg. Endorses cramping and states it feels like a charlie horse. Endorses warmth. Also reports pain that starts in the arch and goes up the leg. Complains of an indentation at the back of the left leg. States her foot feels numb.  Review of Systems: Negative except as noted in the HPI. Denies N/V/F/Ch.  Past Medical History:  Diagnosis Date  . Anemia   . Arthritis    lumbar region- radiculopathy  . Cancer [facility]) 2013    Thyroid Ca-thyroidectomy  . Chronic fatigue   . DM (diabetes mellitus) ([place])   . Gait disturbance   . GERD (gastroesophageal reflux disease)   . Headache(784.0)   . HTN (hypertension)   . Hypothyroidism   . Nephrolithiasis   . Obesity   . OSA (obstructive sleep apnea)    last test- many yrs. ago, done at [facility], no CPAP in use  . Osteoporosis   . Peripheral neuropathy   . Prolonged QT interval   . Sarcoidosis    sarcoidosis in Brain, treated with surgery    Current Outpatient Medications:  .  acetaminophen (TYLENOL) 500 MG tablet, Take 500 mg by mouth every 6 (six) hours as needed for headache., Disp: , Rfl:  .  aspirin 81 MG chewable tablet, Chew 81 mg by mouth daily., Disp: , Rfl:  .  atorvastatin (LIPITOR) 20 MG tablet, TAKE 1 TABLET BY MOUTH EVERY DAY IN THE EVENING, Disp: 31 tablet, Rfl: 6 .  folic acid (FOLVITE) 1 MG tablet, TAKE 1 TABLET BY MOUTH EVERY DAY, Disp: 100 tablet, Rfl: 2 .  HYDROcodone-acetaminophen (NORCO/VICODIN) 5-325 MG tablet, Take 1-2 tablets by mouth every 6 (six) hours as needed for severe pain., Disp: 20 tablet, Rfl: 0 .   levothyroxine (SYNTHROID, LEVOTHROID) 150 MCG tablet, TAKE 1 TABLET (150 MCG TOTAL) BY MOUTH DAILY BEFORE BREAKFAST., Disp: 90 tablet, Rfl: 0 .  losartan (COZAAR) 50 MG tablet, Take 1 tablet (50 mg total) by mouth daily., Disp: 30 tablet, Rfl: 11 .  metFORMIN (GLUCOPHAGE) 1000 MG tablet, TAKE 1 TABLET BY MOUTH TWICE A DAY WITH A MEAL, Disp: 120 tablet, Rfl: 2 .  methotrexate (RHEUMATREX) 2.5 MG tablet, TAKE 4 TABLETS (10 MG TOTAL) BY MOUTH ONCE A WEEK. CAUTION:CHEMOTHERAPY. PROTECT FROM LIGHT., Disp: 48 tablet, Rfl: 0 .  Multiple Vitamin (MULTIVITAMIN WITH MINERALS) TABS tablet, Take 1 tablet by mouth every evening. , Disp: , Rfl:  .  ondansetron (ZOFRAN ODT) 4 MG disintegrating tablet, Take 1 tablet (4 mg total) by mouth every 8 (eight) hours as needed for nausea or vomiting., Disp: 10 tablet, Rfl: 0 .  ondansetron (ZOFRAN) 4 MG tablet, Take 1 tablet (4 mg total) by mouth every 8 (eight) hours as needed for nausea or vomiting., Disp: 20 tablet, Rfl: 0 .  pantoprazole (PROTONIX) 20 MG tablet, TAKE 1 TABLET BY MOUTH EVERY DAY, Disp: 30 tablet, Rfl: 5 .  pregabalin (LYRICA) 50 MG capsule, One capsule in the morning and 2 in the evening, Disp: 90 capsule, Rfl: 3 .  sucralfate (CARAFATE) 1 g tablet,  Take 1 tablet (1 g total) by mouth 4 (four) times daily -  with meals and at bedtime., Disp: 60 tablet, Rfl: 0 .  traMADol (ULTRAM) 50 MG tablet, Take 1 tablet (50 mg total) by mouth every 8 (eight) hours as needed., Disp: 20 tablet, Rfl: 0  Social History   Tobacco Use  Smoking Status Never Smoker  Smokeless Tobacco Never Used    No Known Allergies Objective:  There were no vitals filed for this visit. There is no height or weight on file to calculate BMI. Constitutional Well developed. Well nourished.  Vascular Foot warm and well perfused. Capillary refill normal to all digits.   Neurologic Normal speech. Oriented to person, place, and time. Epicritic sensation to light touch grossly present  bilaterally.  Dermatologic Skin healing well without signs of infection. Skin edges well coapted without signs of infection.  Orthopedic: Tenderness to palpation noted about the surgical site.   Radiographs: None Assessment:   1. Post-operative state   2. Pain of left calf   3. Swelling    Plan:  Patient was evaluated and treated and all questions answered.  S/p foot surgery left -Patient presents with pain, swelling, warmth, and cramping of the left leg. Given symptoms and somewhat recent surgery advised patient to present to the ED to rule out DVT -Rx for Tramadol for post-surgical foot pain.  No follow-ups on file.

## 2018-08-09 NOTE — Telephone Encounter (Signed)
Left message for pt to call to discuss the outcome of her ED visit, we did not have an ED note.

## 2018-08-12 ENCOUNTER — Encounter: Payer: Self-pay | Admitting: Internal Medicine

## 2018-08-12 ENCOUNTER — Ambulatory Visit (HOSPITAL_COMMUNITY)
Admission: RE | Admit: 2018-08-12 | Discharge: 2018-08-12 | Disposition: A | Discharge status: Home / Self Care | Payer: Medicaid Other | Source: Ambulatory Visit | Attending: Oncology | Admitting: Oncology

## 2018-08-12 ENCOUNTER — Other Ambulatory Visit: Payer: Self-pay | Admitting: Neurology

## 2018-08-12 ENCOUNTER — Ambulatory Visit: Payer: Medicaid Other | Admitting: Internal Medicine

## 2018-08-12 ENCOUNTER — Other Ambulatory Visit: Payer: Self-pay

## 2018-08-12 VITALS — BP 134/75 | HR 99 | Temp 98.0°F | Ht 68.0 in | Wt 220.0 lb

## 2018-08-12 DIAGNOSIS — G8929 Other chronic pain: Secondary | ICD-10-CM | POA: Diagnosis not present

## 2018-08-12 DIAGNOSIS — Z23 Encounter for immunization: Secondary | ICD-10-CM | POA: Insufficient documentation

## 2018-08-12 DIAGNOSIS — M7989 Other specified soft tissue disorders: Secondary | ICD-10-CM

## 2018-08-12 DIAGNOSIS — Z9889 Other specified postprocedural states: Secondary | ICD-10-CM | POA: Diagnosis not present

## 2018-08-12 DIAGNOSIS — J3489 Other specified disorders of nose and nasal sinuses: Secondary | ICD-10-CM

## 2018-08-12 DIAGNOSIS — Z79899 Other long term (current) drug therapy: Secondary | ICD-10-CM | POA: Diagnosis not present

## 2018-08-12 DIAGNOSIS — M25572 Pain in left ankle and joints of left foot: Secondary | ICD-10-CM

## 2018-08-12 DIAGNOSIS — M79662 Pain in left lower leg: Secondary | ICD-10-CM

## 2018-08-12 DIAGNOSIS — I10 Essential (primary) hypertension: Secondary | ICD-10-CM

## 2018-08-12 MED ORDER — OXYCODONE-ACETAMINOPHEN 10-325 MG PO TABS: 1.0000 | ORAL_TABLET | Freq: Four times a day (QID) | ORAL | 0 refills | Status: DC | PRN

## 2018-08-12 MED ORDER — HYDROCODONE-ACETAMINOPHEN 5-325 MG PO TABS: 1.0000 | ORAL_TABLET | Freq: Four times a day (QID) | ORAL | 0 refills | Status: DC | PRN

## 2018-08-12 MED ORDER — MELOXICAM 15 MG PO TABS: 15.0000 mg | ORAL_TABLET | Freq: Every day | ORAL | 0 refills | Status: DC

## 2018-08-12 MED ORDER — SALINE SPRAY 0.65 % NA SOLN: 1.0000 | NASAL | 2 refills | Status: DC | PRN

## 2018-08-12 MED ORDER — KETOROLAC TROMETHAMINE 60 MG/2ML IM SOLN
60.0000 mg | Freq: Once | INTRAMUSCULAR | Status: AC
  Administered 2018-08-12: 60 mg via INTRAMUSCULAR

## 2018-08-12 MED ORDER — FLUTICASONE PROPIONATE 50 MCG/ACT NA SUSP: 1.0000 | Freq: Every day | NASAL | 2 refills | Status: DC

## 2018-08-12 NOTE — Patient Instructions (Addendum)
Thank you for visiting clinic today. As we discussed you are going to get your ultrasound done to rule out any clot, we will call you for results. It is very important that you make an appointment with your surgeon. You might be experiencing some mild inflammation or infection at your surgical site. I am also giving you a pain medicine called meloxicam/Mobic 15 mg daily you will take it every day for the next few days and then as needed. I am also giving you Norco for pain, you can take it every 6 hourly if needed but try taking it just at night. You can ice and keep your leg elevated to help with pain and swelling both. I am giving you Flonase nasal drops to use it daily and saline nasal drops to use it multiple times a day to help with your small lacerations and swelling of nose. Please follow-up with your PCP according to your scheduled appointment for your diabetes.

## 2018-08-12 NOTE — Assessment & Plan Note (Signed)
BP Readings from Last 3 Encounters:  08/12/18 134/75  07/14/18 139/85  06/03/18 (!) 149/77   Patient was having a little elevated blood pressure on presentation, most likely due to pain as it improved after Toradol.  She is compliant with her losartan 50 mg daily-we will continue with current dosage. We will reevaluate during next follow-up visit.

## 2018-08-12 NOTE — Progress Notes (Signed)
*  Preliminary Results* Left lower extremity venous duplex completed. Left lower extremity is negative for deep vein thrombosis. There is no evidence of left Baker's cyst.  08/12/2018 3:53 PM  [name] [name]

## 2018-08-12 NOTE — Assessment & Plan Note (Signed)
Flu shot was provided. 

## 2018-08-12 NOTE — Progress Notes (Signed)
CC: Left ankle and leg swelling and pain.  HPI:  [name] is a 52 y.o. with past medical history as listed below came to the clinic with worsening swelling and pain of left ankle radiating towards her knee.  Patient had her left posterior tibial tendon repair done on May 28, 2018, her pain never subsided since then.  For the past 3 weeks she is having this intermittent swelling, which is worse at the end of the day, improved when she keeps her leg elevated and in the morning.  She continued to experience worsening pain 7-8/10 at all the time, pain is mostly around her ankle and dorsum of foot and radiating towards her knee.  She has swelling around her ankle and dorsum of the foot all the time which progress towards her knee at the end of the day.  Pain do interfere with her sleep and she is unable to sleep for the past few nights.  She has tried taking multiple ibuprofen and Aleve with not much help.  She was given tramadol by her surgeon with no relief.  Patient is still using cam boot.  Is unable to walk around her house because of the pain. She went to see her podiatrist on August 06, 2018 for similar complaints and was told to go to emergency room to get a venous Doppler to rule out DVT, she went to the emergency room but was told that she has to wait more than 4-hour so she left without being seen. Patient denies any chest pain or shortness of breath. Denies any fever or chills.  She was also complaining of getting some swelling and lacerations in her both nasal turbinate area which cause some pain and intermittent bleeding.   Past Medical History:  Diagnosis Date  . Anemia   . Arthritis    lumbar region- radiculopathy  . Cancer [facility]) 2013    Thyroid Ca-thyroidectomy  . Chronic fatigue   . DM (diabetes mellitus) ([place])   . Gait disturbance   . GERD (gastroesophageal reflux disease)   . Headache(784.0)   . HTN (hypertension)   . Hypothyroidism   . Nephrolithiasis     . Obesity   . OSA (obstructive sleep apnea)    last test- many yrs. ago, done at [facility], no CPAP in use  . Osteoporosis   . Peripheral neuropathy   . Prolonged QT interval   . 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  Physical Exam:  Vitals:   08/12/18 1320  BP: (!) 155/75  Pulse: (!) 105  Temp: 98 F (36.7 C)  TempSrc: Oral  SpO2: 100%  Weight: 220 lb (99.8 kg)  Height: 5\' 8"  (1.727 m)   General: Vital signs reviewed.  Patient is well-developed and well-nourished, in no acute distress and cooperative with exam.  Head: Normocephalic and atraumatic. Cardiovascular: Sinus tachycardia Pulmonary/Chest: Clear to auscultation bilaterally, no wheezes, rales, or rhonchi. Abdominal: Soft, non-tender, non-distended, BS +,  Extremities: Marked edema around left ankle and dorsum of left foot, mild erythema with no hyperthermia, well-healed surgical scar around left medial malleolus.  Mild left lower leg edema as compared to right, no calf tenderness, pulses intact and symmetrical. Skin: Warm, dry and intact. No rashes or erythema. Psychiatric: Normal mood and affect. speech and behavior is normal. Cognition and memory are normal.  Assessment & Plan:   See Encounters Tab for problem based charting.  Patient  discussed with Dr. Rebeca Alert.

## 2018-08-12 NOTE — Progress Notes (Signed)
Medicine attending: Medical history, presenting problems, physical findings, and medications, reviewed with resident physician Dr Randon Goldsmith on the day of the patient visit and I concur with her evaluation and management plan.

## 2018-08-12 NOTE — Assessment & Plan Note (Addendum)
She was having marked edema and mild erythema around left ankle and dorsum of foot.  There were some edema of left lower extremity, no calf tenderness.  Her well score for DVT was 1 because of her recent surgery within 12 weeks. Her podiatrist also want to rule out DVT before proceeding for surgical site problem. Appears more like a surgical site issue, persistent inflammation/infection.  There was no hyperthermia and surgical site appears healthy.  There was mild erythema.  -Venous Doppler to rule out DVT for left lower extremity was scheduled for 4 PM today.  We will call the patient with any abnormal results. -She was given a prescription of meloxicam and advised to take it daily for the next few days and then as needed. -She was also given a prescription for Norco to be used mostly at bedtime so she can get some sleep. -She was also advised to keep her leg elevated and ice her ankle to help with pain and swelling. -She was advised to follow-up with her surgeon as soon as possible.  Addendum.  Left lower extremity venous Doppler was negative for any DVT.  Called the patient and discussed the results. Should follow-up with her surgeon.

## 2018-08-19 ENCOUNTER — Other Ambulatory Visit: Payer: Self-pay | Admitting: Internal Medicine

## 2018-08-19 DIAGNOSIS — E890 Postprocedural hypothyroidism: Secondary | ICD-10-CM

## 2018-08-24 ENCOUNTER — Telehealth: Payer: Self-pay | Admitting: Podiatry

## 2018-08-24 NOTE — Telephone Encounter (Signed)
Patient was called and left message to call office to come in for a follow up with Dr. March Rummage or Dr. Jacqualyn Posey. Patient is needing a follow up to surgery and we have called many times and left messages to bring patient in. We are sending a certified letter since calls have not been returned.

## 2018-08-30 ENCOUNTER — Encounter: Payer: Self-pay | Admitting: Podiatry

## 2018-08-31 ENCOUNTER — Emergency Department (HOSPITAL_COMMUNITY)
Admission: EM | Admit: 2018-08-31 | Discharge: 2018-08-31 | Disposition: A | Discharge status: Home / Self Care | Payer: Medicaid Other | Attending: Emergency Medicine | Admitting: Emergency Medicine

## 2018-08-31 ENCOUNTER — Encounter (HOSPITAL_COMMUNITY): Payer: Self-pay | Admitting: Emergency Medicine

## 2018-08-31 DIAGNOSIS — M545 Low back pain: Secondary | ICD-10-CM | POA: Diagnosis present

## 2018-08-31 DIAGNOSIS — M5442 Lumbago with sciatica, left side: Secondary | ICD-10-CM | POA: Insufficient documentation

## 2018-08-31 DIAGNOSIS — Z79899 Other long term (current) drug therapy: Secondary | ICD-10-CM | POA: Insufficient documentation

## 2018-08-31 DIAGNOSIS — E039 Hypothyroidism, unspecified: Secondary | ICD-10-CM | POA: Insufficient documentation

## 2018-08-31 DIAGNOSIS — I10 Essential (primary) hypertension: Secondary | ICD-10-CM | POA: Insufficient documentation

## 2018-08-31 DIAGNOSIS — E119 Type 2 diabetes mellitus without complications: Secondary | ICD-10-CM | POA: Insufficient documentation

## 2018-08-31 DIAGNOSIS — Z7982 Long term (current) use of aspirin: Secondary | ICD-10-CM | POA: Insufficient documentation

## 2018-08-31 DIAGNOSIS — Z85850 Personal history of malignant neoplasm of thyroid: Secondary | ICD-10-CM | POA: Diagnosis not present

## 2018-08-31 MED ORDER — KETOROLAC TROMETHAMINE 60 MG/2ML IM SOLN
60.0000 mg | Freq: Once | INTRAMUSCULAR | Status: AC
  Administered 2018-08-31: 60 mg via INTRAMUSCULAR
  Filled 2018-08-31: qty 2

## 2018-08-31 MED ORDER — CYCLOBENZAPRINE HCL 10 MG PO TABS
10.0000 mg | ORAL_TABLET | Freq: Once | ORAL | Status: AC
  Administered 2018-08-31: 10 mg via ORAL
  Filled 2018-08-31: qty 1

## 2018-08-31 MED ORDER — IBUPROFEN 800 MG PO TABS: 800.0000 mg | ORAL_TABLET | Freq: Three times a day (TID) | ORAL | 0 refills | Status: AC

## 2018-08-31 MED ORDER — CYCLOBENZAPRINE HCL 10 MG PO TABS: 10.0000 mg | ORAL_TABLET | Freq: Three times a day (TID) | ORAL | 0 refills | Status: AC | PRN

## 2018-08-31 NOTE — Discharge Instructions (Addendum)
Your physical exam today is consistent with sciatica.   Since you are unable to do steroids, we will just have to stick with anti-inflammatories and a muscle relaxer. I have written you a prescription for Flexeril which is a muscle relaxer and ibuprofen 800mg . You may also use Tylenol. You may also use warm or cold compresses for additional relief.   You may follow-up with your PCP or Dr. Jannifer Franklin if you continue to have issues for more than 4-6 weeks.  Thank you for allowing me to take care of you today!

## 2018-08-31 NOTE — ED Provider Notes (Signed)
[facility] DEPT Provider Note  CSN: [id number] Arrival date & time: 08/31/18  1143    History   Chief Complaint Chief Complaint  Patient presents with  . Back Pain    HPI [name] [name] is a 52 y.o. female with a medical history of lumbar fusion, thyroid cancer, HTN, GERD and Type 2 DM who presented to the ED for back pain x3 days. Patient describes throbbing, aching pain in her low back on the left side. Pain does radiate to her left lower extremity which she describes as burning and sharp. Patient reports pain began acutely 3 days ago. Denies any falls, injuries, heavy lifting or unusual physical movements. Pain worse with walking and leg straightening. No specific relieving positions or factors. Denies fever, other arthralgias, skin rashes/lesions, bowel or bladder incontinence, saddle anesthesia, paresthesias, weakness or gait difficulties. Patient has a history of back pain and had a lumbar fusion several years ago and states that she always some amount pain from that. Patient has tried Ibuprofen prior to coming to the ED.  Past Medical History:  Diagnosis Date  . Anemia   . Arthritis    lumbar region- radiculopathy  . Cancer [facility]) 2013    Thyroid Ca-thyroidectomy  . Chronic fatigue   . DM (diabetes mellitus) ([place])   . Gait disturbance   . GERD (gastroesophageal reflux disease)   . Headache(784.0)   . HTN (hypertension)   . Hypothyroidism   . Nephrolithiasis   . Obesity   . OSA (obstructive sleep apnea)    last test- many yrs. ago, done at [facility], no CPAP in use  . Osteoporosis   . Peripheral neuropathy   . Prolonged QT interval   . 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 Need for immunization against influenza 08/12/2018  . Rash 02/01/2018  . Pain and swelling of lower leg, left 12/16/2017  . Abnormal cervical Papanicolaou smear 04/17/2017  . Throat pain  04/17/2017  . Obesity 11/24/2016  . Herniated lumbar disc without myelopathy 08/20/2016  . Healthcare maintenance 02/18/2016  . Erythema nodosum 02/01/2016  . Insomnia 01/02/2015  . Intractable migraine without aura 02/09/2013  . Intermittent left-sided chest pain 01/26/2013  . Hypothyroidism 05/08/2011  . FIBROIDS, UTERUS 11/06/2010  . Abdominal pain 06/25/2010  . HYPERLIPIDEMIA 01/28/2010  . Sciatica of left side associated with disorder of lumbar spine 10/20/2007  . 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 Diabetes mellitus type 2, controlled ([place]) 05/25/2007  . OBSTRUCTIVE SLEEP APNEA 05/25/2007  . Essential hypertension 05/25/2007  . Gastroesophageal reflux disease 05/25/2007  . History of osteoporosis 05/25/2007  . LONG QT SYNDROME 05/25/2007    Past Surgical History:  Procedure Laterality Date  . BRAIN SURGERY  2001   for sarcoidosis  . BREAST BIOPSY Left   . BREAST BIOPSY Right   . BREAST EXCISIONAL BIOPSY Right    x2 benign  . BREAST EXCISIONAL BIOPSY Left    benign  . BREAST SURGERY Bilateral    biopsies   . DILATION AND CURETTAGE OF UTERUS    . HYSTEROSCOPY    . LUMBAR LAMINECTOMY/DECOMPRESSION MICRODISCECTOMY Left 08/20/2016   Procedure: Left Lumbar four-five Microdiskectomy;  Surgeon: Leeroy Cha, MD;  Location: [facility];  Service: Neurosurgery;  Laterality: Left;  . PARTIAL HYSTERECTOMY  2013  . ROTATOR CUFF REPAIR Right    07/2013  . Suboccipital craniotomy    . THYROIDECTOMY  Precancerous lesion     OB History    Gravida  5   Para  4   Term      Preterm      AB      Living  4     SAB      TAB      Ectopic      Multiple      Live Births               Home Medications    Prior to Admission medications   Medication Sig Start Date End Date Taking? Authorizing Provider  aspirin 81 MG chewable tablet Chew 81 mg by mouth daily.   Yes [provider]  atorvastatin (LIPITOR) 20 MG tablet TAKE 1 TABLET BY MOUTH EVERY  DAY IN THE EVENING 04/12/18  Yes Rice, Resa Miner, MD  fluticasone (FLONASE) 50 MCG/ACT nasal spray Place 1 spray into both nostrils daily. 08/12/18 08/12/19 Yes Lorella Nimrod, MD  folic acid (FOLVITE) 1 MG tablet TAKE 1 TABLET BY MOUTH EVERY DAY 04/07/18  Yes Rice, Resa Miner, MD  levothyroxine (SYNTHROID, LEVOTHROID) 150 MCG tablet TAKE 1 TABLET (150 MCG TOTAL) BY MOUTH DAILY BEFORE BREAKFAST. 07/28/18  Yes Agyei, Caprice Kluver, MD  losartan (COZAAR) 50 MG tablet Take 1 tablet (50 mg total) by mouth daily. 08/20/17  Yes Rice, Resa Miner, MD  metFORMIN (GLUCOPHAGE) 1000 MG tablet TAKE 1 TABLET BY MOUTH TWICE A DAY WITH A MEAL Patient taking differently: Take 1,000 mg by mouth 2 (two) times daily with a meal.  05/07/18  Yes Rice, Resa Miner, MD  methotrexate (RHEUMATREX) 2.5 MG tablet TAKE 4 TABLETS (10 MG TOTAL) BY MOUTH ONCE A WEEK. CAUTION:CHEMOTHERAPY. PROTECT FROM LIGHT. 08/13/18  Yes Kathrynn Ducking, MD  Multiple Vitamin (MULTIVITAMIN WITH MINERALS) TABS tablet Take 1 tablet by mouth every evening.    Yes [provider]  pantoprazole (PROTONIX) 20 MG tablet TAKE 1 TABLET BY MOUTH EVERY DAY 06/09/18  Yes Axel Filler, MD  sodium chloride (OCEAN) 0.65 % SOLN nasal spray Place 1 spray into both nostrils as needed for congestion. 08/12/18  Yes Lorella Nimrod, MD  cyclobenzaprine (FLEXERIL) 10 MG tablet Take 1 tablet (10 mg total) by mouth 3 (three) times daily as needed for up to 10 days for muscle spasms. 08/31/18 09/10/18  Mortis, Alvie Heidelberg I, PA-C  HYDROcodone-acetaminophen (NORCO) 5-325 MG tablet Take 1 tablet by mouth every 6 (six) hours as needed for moderate pain. Patient not taking: Reported on 08/31/2018 08/12/18   Lorella Nimrod, MD  HYDROcodone-acetaminophen (NORCO/VICODIN) 5-325 MG tablet Take 1-2 tablets by mouth every 6 (six) hours as needed for severe pain. Patient not taking: Reported on 08/31/2018 06/24/18   Hyatt, Max T, DPM  ibuprofen (ADVIL,MOTRIN) 800 MG tablet Take 1  tablet (800 mg total) by mouth 3 (three) times daily for 10 days. 08/31/18 09/10/18  Mortis, Alvie Heidelberg I, PA-C  meloxicam (MOBIC) 15 MG tablet Take 1 tablet (15 mg total) by mouth daily. Patient not taking: Reported on 08/31/2018 08/12/18   Lorella Nimrod, MD  ondansetron (ZOFRAN ODT) 4 MG disintegrating tablet Take 1 tablet (4 mg total) by mouth every 8 (eight) hours as needed for nausea or vomiting. Patient not taking: Reported on 08/31/2018 04/24/18   Carlisle Cater, PA-C  ondansetron (ZOFRAN) 4 MG tablet Take 1 tablet (4 mg total) by mouth every 8 (eight) hours as needed for nausea or vomiting. Patient not taking: Reported on 08/31/2018 05/27/18   Tyson Dense  T, DPM  pregabalin (LYRICA) 50 MG capsule One capsule in the morning and 2 in the evening Patient not taking: Reported on 08/31/2018 07/14/18   Kathrynn Ducking, MD  sucralfate (CARAFATE) 1 g tablet Take 1 tablet (1 g total) by mouth 4 (four) times daily -  with meals and at bedtime. Patient not taking: Reported on 08/31/2018 04/24/18   Carlisle Cater, PA-C  traMADol (ULTRAM) 50 MG tablet Take 1 tablet (50 mg total) by mouth every 8 (eight) hours as needed. Patient not taking: Reported on 08/31/2018 08/06/18   Evelina Bucy, DPM    Family History Family History  Problem Relation Age of Onset  . Heart disease Mother        questionable CAD and arrythmias   . Cancer Mother        Breast cancer  . Venous thrombosis Sister   . Diabetes Sister   . Heart disease Sister   . Diabetes Brother   . Heart disease Brother   . Cancer Maternal Aunt   . Venous thrombosis Sister   . Diabetes Sister   . Heart disease Sister     Social History Social History   Tobacco Use  . Smoking status: Never Smoker  . Smokeless tobacco: Never Used  Substance Use Topics  . Alcohol use: No    Alcohol/week: 0.0 standard drinks  . Drug use: No     Allergies   Patient has no known allergies.   Review of Systems Review of Systems  Constitutional:  Negative for chills, fatigue and fever.  Genitourinary: Negative.   Musculoskeletal: Positive for back pain. Negative for gait problem, neck pain and neck stiffness.  Skin: Negative.   Neurological: Negative for weakness and numbness.   Physical Exam Updated Vital Signs BP (!) 158/98   Pulse 88   Temp 97.9 F (36.6 C) (Oral)   Resp 16   LMP 12/03/2011   SpO2 100%   Physical Exam  Constitutional: She appears well-developed and well-nourished.  Neck: Normal range of motion and full passive range of motion without pain. Neck supple.  Musculoskeletal:       Lumbar back: She exhibits tenderness and spasm. She exhibits no bony tenderness.  Full ROM of upper and lower extremities with 5/5 strength. Left lumbar paraspinal muscle tenderness to palpation. Positive straight leg on the left. No midline tenderness.  Neurological: She has normal strength. No sensory deficit. She exhibits normal muscle tone. Gait normal.  Reflex Scores:      Patellar reflexes are 1+ on the right side and 1+ on the left side.      Achilles reflexes are 1+ on the right side and 1+ on the left side. Skin: Skin is warm and intact. Capillary refill takes less than 2 seconds. No rash noted.  Nursing note and vitals reviewed.  ED Treatments / Results  Labs (all labs ordered are listed, but only abnormal results are displayed) Labs Reviewed - No data to display  EKG None  Radiology No results found.  Procedures Procedures (including critical care time)  Medications Ordered in ED Medications  ketorolac (TORADOL) injection 60 mg (60 mg Intramuscular Given 08/31/18 1350)  cyclobenzaprine (FLEXERIL) tablet 10 mg (10 mg Oral Given 08/31/18 1350)     Initial Impression / Assessment and Plan / ED Course  Triage vital signs and the nursing notes have been reviewed.  Pertinent labs & imaging results that were available during care of the patient were reviewed and considered in medical decision  making (see chart  for details).   Patient presents well appearing and in no acute distress.She is able to ambulate on her own and does so without assistance or issue. Patient has history of past back injury and surgery. She denies preceding injury, unusual movements or excessive lifting prior to this acute episode. There have been no new or severe traumas, falls or injuries that would warrant back imaging today and no midline tenderness. Physical exam is reassuring. History and physical consistent with sciatica. There is no midline tenderness, deformities or abnormal neuro findings on exam or in history to suggest an acute spinal cord or osseus pathology. No systemic s/s to suggest underlying infectious or rheumatologic etiology.  Final Clinical Impressions(s) / ED Diagnoses  1. Low Back Pain with Sciatica. Unable to take steroids. Significant relief achieved with IM Toradol in the ED. Rx for Flexeril and ibuprofen given. Education on other OTC and supportive treatment for relief. Advised to follow-up with PCP or neurosurgeon if symptoms persists > 4-6 weeks.  Dispo: Home. After thorough clinical evaluation, this patient is determined to be medically stable and can be safely discharged with the previously mentioned treatment and/or outpatient follow-up/referral(s). At this time, there are no other apparent medical conditions that require further screening, evaluation or treatment.   Final diagnoses:  Acute left-sided low back pain with left-sided sciatica    ED Discharge Orders         Ordered    cyclobenzaprine (FLEXERIL) 10 MG tablet  3 times daily PRN     08/31/18 1457    ibuprofen (ADVIL,MOTRIN) 800 MG tablet  3 times daily     08/31/18 1457            Mortis, [place] I, PA-C 08/31/18 1655    Drenda Freeze, MD 09/01/18 [phone]

## 2018-08-31 NOTE — ED Triage Notes (Signed)
Pt c/o lower back pain that radiates to L buttock and LLE. Pt c/o severe pain. Pt had back surgery 3 years ago and recent L foot surgery and pt states her back pain is severe and keeping her up at night, ibuprofen attempted but no relief.

## 2018-09-03 ENCOUNTER — Other Ambulatory Visit: Payer: Self-pay

## 2018-09-03 ENCOUNTER — Ambulatory Visit: Payer: Medicaid Other | Admitting: Internal Medicine

## 2018-09-03 ENCOUNTER — Encounter: Payer: Self-pay | Admitting: Internal Medicine

## 2018-09-03 VITALS — BP 152/81 | HR 91 | Temp 98.0°F | Ht 68.0 in | Wt 224.4 lb

## 2018-09-03 DIAGNOSIS — E119 Type 2 diabetes mellitus without complications: Secondary | ICD-10-CM | POA: Diagnosis not present

## 2018-09-03 DIAGNOSIS — G8929 Other chronic pain: Secondary | ICD-10-CM

## 2018-09-03 DIAGNOSIS — Z79899 Other long term (current) drug therapy: Secondary | ICD-10-CM | POA: Diagnosis not present

## 2018-09-03 DIAGNOSIS — G4700 Insomnia, unspecified: Secondary | ICD-10-CM

## 2018-09-03 DIAGNOSIS — M48061 Spinal stenosis, lumbar region without neurogenic claudication: Secondary | ICD-10-CM | POA: Diagnosis not present

## 2018-09-03 DIAGNOSIS — I10 Essential (primary) hypertension: Secondary | ICD-10-CM

## 2018-09-03 DIAGNOSIS — Z981 Arthrodesis status: Secondary | ICD-10-CM

## 2018-09-03 DIAGNOSIS — M5442 Lumbago with sciatica, left side: Secondary | ICD-10-CM

## 2018-09-03 DIAGNOSIS — E039 Hypothyroidism, unspecified: Secondary | ICD-10-CM

## 2018-09-03 DIAGNOSIS — Z7984 Long term (current) use of oral hypoglycemic drugs: Secondary | ICD-10-CM | POA: Diagnosis not present

## 2018-09-03 DIAGNOSIS — M1990 Unspecified osteoarthritis, unspecified site: Secondary | ICD-10-CM

## 2018-09-03 DIAGNOSIS — M5386 Other specified dorsopathies, lumbar region: Secondary | ICD-10-CM

## 2018-09-03 LAB — GLUCOSE, CAPILLARY: GLUCOSE-CAPILLARY: 164 mg/dL — AB (ref 70–99)

## 2018-09-03 LAB — POCT GLYCOSYLATED HEMOGLOBIN (HGB A1C): HEMOGLOBIN A1C: 8.7 % — AB (ref 4.0–5.6)

## 2018-09-03 MED ORDER — LOSARTAN POTASSIUM 50 MG PO TABS: 100.0000 mg | ORAL_TABLET | Freq: Every day | ORAL | 3 refills | Status: DC

## 2018-09-03 MED ORDER — ZOLPIDEM TARTRATE 5 MG PO TABS: 5.0000 mg | ORAL_TABLET | Freq: Every evening | ORAL | 0 refills | Status: DC | PRN

## 2018-09-03 MED ORDER — METFORMIN HCL 1000 MG PO TABS: ORAL_TABLET | ORAL | 2 refills | Status: DC

## 2018-09-03 MED ORDER — GLIPIZIDE 5 MG PO TABS: 5.0000 mg | ORAL_TABLET | Freq: Every day | ORAL | 3 refills | Status: DC

## 2018-09-03 NOTE — Progress Notes (Signed)
   CC: Routine follow up, lower back pain   HPI:  [name] [name] is a 52 y.o. 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  Please see problem based charting for further details.    Past Medical History:  Diagnosis Date  . Anemia   . Arthritis    lumbar region- radiculopathy  . Cancer [facility]) 2013    Thyroid Ca-thyroidectomy  . Chronic fatigue   . DM (diabetes mellitus) ([place])   . Gait disturbance   . GERD (gastroesophageal reflux disease)   . Headache(784.0)   . HTN (hypertension)   . Hypothyroidism   . Nephrolithiasis   . Obesity   . OSA (obstructive sleep apnea)    last test- many yrs. ago, done at [facility], no CPAP in use  . Osteoporosis   . Peripheral neuropathy   . Prolonged QT interval   . 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   Assessment & Plan:   See Encounters Tab for problem based charting.  Patient discussed with Dr. Evette Doffing.

## 2018-09-03 NOTE — Patient Instructions (Signed)
Ms. Cadence,  It was a pleasure taking care of you here at the clinic today. Sorry about the ongoing pain in your back. As we discussed, I will like for you to set up an appointment and follow up with the Neurosurgeons.   For your diabetes, I am adding another medication called Glipizide 5mg  and you will take 1 pill each day   For the high blood pressure, I am increasing the Losartan to 100mg  daily.   ~Take Care Dr. Eileen Stanford

## 2018-09-04 ENCOUNTER — Encounter: Payer: Self-pay | Admitting: Internal Medicine

## 2018-09-04 NOTE — Assessment & Plan Note (Signed)
This issue is chronic. She has been experiencing lower back pain for >3 years. 2 years ago (08/20/2016) she underwent L4-L5 microdisckectomy due to lumbar fusion. She had improvement post-op but the pain re-occurred 6 months after the procedure. A repeat MRI (07/13/2017) of Lumbar region showed L4-5 disc bulging; left posterior lamincetomy, left lateral recess stenosis and moderate left foraminal stenosis. Previous management of her pain has included epidural spinal injection, aleve, ibuprofen, flexeril, narcotics. Recently on 08/31/2018, she presented to [facility] ED with left sided lower back pain and received IM Toradol with some relief. Currently, she describes the pain as sharp in nature, worse when she lays down with intermittent radiation tot he LLE. She denies lower extremity weakness, numbness, tingling. Also reports that the pain does not limit her ADLs. She has not followed up with neurosurgery in the past 6 months as her surgeon retired.   Plan: -Advised to continue daily supportive and exercise -Follow up with neurosurgery to discuss ESI or intra-articular injections

## 2018-09-04 NOTE — Assessment & Plan Note (Signed)
Uncontrolled and not at goal. Her goal is <138/80 given history of diabetes.   BP Readings from Last 3 Encounters:  09/03/18 (!) 152/81  08/31/18 (!) 158/98  08/12/18 134/75   She is currently on Losartan 50mg  daily.   Plan: -Increase Losartan to 100mg  daily. -Last BMP on 8/14 was unremarkable

## 2018-09-04 NOTE — Assessment & Plan Note (Signed)
Uncontrolled. A1C today was 8.7 from 7.7 (04/02/18). She does not monitor her BGs at home.   Plan: -Continue metformin 1000mg  BID -Add Glipizide 5mg  daily.

## 2018-09-06 NOTE — Progress Notes (Signed)
Internal Medicine Clinic Attending  I saw and evaluated the patient.  I personally confirmed the key portions of the history and exam documented by Dr. Agyei and I reviewed pertinent patient test results.  The assessment, diagnosis, and plan were formulated together and I agree with the documentation in the resident's note.  

## 2018-09-10 ENCOUNTER — Telehealth: Payer: Self-pay | Admitting: Dietician

## 2018-09-15 NOTE — Telephone Encounter (Signed)
Schedule for retinal image son same day she sees Dr. Eileen Stanford in November.

## 2018-10-19 ENCOUNTER — Ambulatory Visit: Payer: Medicaid Other | Admitting: Podiatry

## 2018-10-19 DIAGNOSIS — M76822 Posterior tibial tendinitis, left leg: Secondary | ICD-10-CM

## 2018-10-19 MED ORDER — OXYCODONE-ACETAMINOPHEN 10-325 MG PO TABS: 1.0000 | ORAL_TABLET | ORAL | 0 refills | Status: AC | PRN

## 2018-10-20 ENCOUNTER — Telehealth: Payer: Self-pay | Admitting: *Deleted

## 2018-10-20 DIAGNOSIS — Z9889 Other specified postprocedural states: Secondary | ICD-10-CM

## 2018-10-20 DIAGNOSIS — M66872 Spontaneous rupture of other tendons, left ankle and foot: Secondary | ICD-10-CM

## 2018-10-20 DIAGNOSIS — M76822 Posterior tibial tendinitis, left leg: Secondary | ICD-10-CM

## 2018-10-20 DIAGNOSIS — R609 Edema, unspecified: Secondary | ICD-10-CM

## 2018-10-20 NOTE — Progress Notes (Signed)
She presents today date of surgery May 20, 2018 ill with severe swelling to the left lower extremity no calf pain she states that it swells at nighttime in particular and it always hurts morning is not quite as bad evening is terrible she cannot rest because of the pain.  Objective: Vital signs are stable she is alert and oriented x3.  Pulses are palpable.  Left lower extremity demonstrates edema which appears to be pitting and she has warmth and tenderness overlying the distalmost aspect of the incision site.  Assessment: Continued postop pain right status post [facility] and posterior tibial tendon repair.  Plan: 1 her request another MRI at this point is been several months now she should have been doing a lot better than this as long as the MRI is normal we will continue.

## 2018-10-20 NOTE — Telephone Encounter (Signed)
Called [employer] - Medicaid-[name] P. States the case did go through and will need clinicals to pre-cert, Service Order: [id number]. Faxed orders, clinicals and demographics to [facility].

## 2018-10-20 NOTE — Telephone Encounter (Signed)
[employer] - Medicaid website stopped and did not inform if Pre-cert was approved or clinicals needed to be sent.

## 2018-10-20 NOTE — Telephone Encounter (Signed)
-----   Message from [place] sent at 10/19/2018  4:57 PM EST ----- Regarding: MRI  MRI left ankle - evaluate posterior tibial tendon tear s/p DOS 05/28/18 Surgical consideration

## 2018-10-22 ENCOUNTER — Ambulatory Visit: Payer: Medicaid Other | Admitting: Dietician

## 2018-10-22 ENCOUNTER — Encounter: Payer: Medicaid Other | Admitting: Internal Medicine

## 2018-11-01 ENCOUNTER — Ambulatory Visit (HOSPITAL_COMMUNITY): Admission: RE | Admit: 2018-11-01 | Payer: Medicaid Other | Source: Ambulatory Visit

## 2018-11-01 ENCOUNTER — Ambulatory Visit (HOSPITAL_COMMUNITY)
Admission: RE | Admit: 2018-11-01 | Discharge: 2018-11-01 | Disposition: A | Discharge status: Home / Self Care | Payer: Medicaid Other | Source: Ambulatory Visit | Attending: Podiatry | Admitting: Podiatry

## 2018-11-01 ENCOUNTER — Encounter (HOSPITAL_COMMUNITY): Payer: Self-pay

## 2018-11-01 DIAGNOSIS — M66872 Spontaneous rupture of other tendons, left ankle and foot: Secondary | ICD-10-CM | POA: Insufficient documentation

## 2018-11-01 DIAGNOSIS — R609 Edema, unspecified: Secondary | ICD-10-CM | POA: Diagnosis present

## 2018-11-01 DIAGNOSIS — M76822 Posterior tibial tendinitis, left leg: Secondary | ICD-10-CM | POA: Insufficient documentation

## 2018-11-02 ENCOUNTER — Other Ambulatory Visit: Payer: Self-pay | Admitting: Internal Medicine

## 2018-11-02 ENCOUNTER — Other Ambulatory Visit: Payer: Self-pay | Admitting: Neurology

## 2018-11-02 DIAGNOSIS — E890 Postprocedural hypothyroidism: Secondary | ICD-10-CM

## 2018-11-02 DIAGNOSIS — I10 Essential (primary) hypertension: Secondary | ICD-10-CM

## 2018-11-04 ENCOUNTER — Telehealth: Payer: Self-pay | Admitting: *Deleted

## 2018-11-04 ENCOUNTER — Telehealth: Payer: Self-pay | Admitting: Podiatry

## 2018-11-04 NOTE — Telephone Encounter (Signed)
This message answered in Result Note message.

## 2018-11-04 NOTE — Telephone Encounter (Signed)
Left message and faxed request for copy of pt's last MRI disc copy to be mailed to [facility] [place].

## 2018-11-04 NOTE — Telephone Encounter (Signed)
Pt had MRI done on Monday and is following up to see if we have received results.

## 2018-11-04 NOTE — Telephone Encounter (Signed)
-----   Message from Garrel Ridgel, [place] sent at 11/02/2018 12:28 PM EST ----- Send for over read and inform patient of the delay.

## 2018-11-04 NOTE — Telephone Encounter (Signed)
I informed pt of Dr. Stephenie Acres review of results and request to send copy of the MRI disc to a radiology specialist for more details to plan treatment, there would be a 7-14 day delay for final results and we would call with instructions once received. Pt states understanding.

## 2018-11-09 NOTE — Telephone Encounter (Signed)
[name] [name] Records states pt had MRI 11/01/2018 and fax a release for the MRI disc to her and it would go out by courier tomorrow. Faxed request for MRI disc copy to [facility].

## 2018-11-10 ENCOUNTER — Other Ambulatory Visit: Payer: Self-pay | Admitting: Internal Medicine

## 2018-11-11 NOTE — Telephone Encounter (Signed)
Received copy of MRI disc, and mailed to [facility].

## 2018-11-18 NOTE — Progress Notes (Signed)
Referring-Obed Agyei MD Reason for referral-Chest pain  HPI: 52 year old female for evaluation of chest pain at request of Nathanial Rancher MD; also with h/o sarcoidosis, diabetes, and previously diagnosed long QT.  Seen previously in this office but not since November 05, 2015. Echocardiogram in July 2005 showed normal LV function. Nuclear study 5/12 showed EF 49% with a small apical defect most likely from soft tissue attenuation. Small prior infarct could not be excluded. Stress echocardiogram April 2014 normal.  Patient states she had chest pain approximately 2 weeks ago.  It was in the left breast area and radiated to her left arm.  Lasted several days waxing and waning.  Some increased with lying on left side.  Not pleuritic.  No associated symptoms.  Denies dyspnea on exertion, orthopnea.  Intermittent edema in left ankle from previous surgery.  Cardiology asked to evaluate.  Current Outpatient Medications  Medication Sig Dispense Refill  . aspirin 81 MG chewable tablet Chew 81 mg by mouth daily.    Marland Kitchen atorvastatin (LIPITOR) 20 MG tablet TAKE 1 TABLET BY MOUTH EVERY DAY IN THE EVENING 31 tablet 6  . folic acid (FOLVITE) 1 MG tablet TAKE 1 TABLET BY MOUTH EVERY DAY 90 tablet 3  . glipiZIDE (GLUCOTROL) 5 MG tablet Take 1 tablet (5 mg total) by mouth daily before breakfast. 30 tablet 3  . levothyroxine (SYNTHROID, LEVOTHROID) 150 MCG tablet TAKE 1 TABLET (150 MCG TOTAL) BY MOUTH DAILY BEFORE BREAKFAST. 90 tablet 0  . losartan (COZAAR) 100 MG tablet Take 1 tablet (100 mg total) by mouth daily. 30 tablet 3  . metFORMIN (GLUCOPHAGE) 1000 MG tablet TAKE 1 TABLET BY MOUTH TWICE A DAY WITH A MEAL 120 tablet 2  . methotrexate (RHEUMATREX) 2.5 MG tablet TAKE 4 TABLETS (10 MG TOTAL) BY MOUTH ONCE A WEEK. CAUTION:CHEMOTHERAPY. PROTECT FROM LIGHT. 48 tablet 1  . Multiple Vitamin (MULTIVITAMIN WITH MINERALS) TABS tablet Take 1 tablet by mouth every evening.     . pantoprazole (PROTONIX) 20 MG tablet TAKE 1  TABLET BY MOUTH EVERY DAY 30 tablet 5  . zolpidem (AMBIEN) 5 MG tablet Take 1 tablet (5 mg total) by mouth at bedtime as needed for sleep. 30 tablet 0   No current facility-administered medications for this visit.     No Known Allergies   Past Medical History:  Diagnosis Date  . Abdominal pain 06/25/2010   CT abd/pelvis 05/2014: Very large amount of stool noted distending the colon. The size consistent with severe constipation/fecal impaction.    . Abnormal cervical Papanicolaou smear 04/17/2017  . Anemia   . Arthritis    lumbar region- radiculopathy  . Cancer [facility]) 2013    Thyroid Ca-thyroidectomy  . Chronic fatigue   . DM (diabetes mellitus) ([place])   . Gait disturbance   . GERD (gastroesophageal reflux disease)   . Headache(784.0)   . History of osteoporosis 05/25/2007   DEXA 2014 Since the baseline examination in 2002, there has been a  statistically significant 19.5% increase in bone mineral density in  the lumbar spine and a statistically significant 14.6% increase in  bone mineral density in the left femur.   DEXA 2002 with osteoporosis of lumbar spine and moderate osteopenia of left hip MRI 2002 left hip negative for avascular necrosis  Qualifier: Diagnosis o  . HTN (hypertension)   . Hypothyroidism   . Insomnia 01/02/2015  . Intermittent left-sided chest pain 01/26/2013  . Nephrolithiasis   . Obesity   . OSA (obstructive sleep  apnea)    last test- many yrs. ago, done at [facility], no CPAP in use  . Osteoporosis   . Peripheral neuropathy   . Prolonged QT interval   . 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 BRAIN SURGERY  2001   for sarcoidosis  . BREAST BIOPSY Left   . BREAST BIOPSY Right   . BREAST EXCISIONAL BIOPSY Right    x2 benign  . BREAST EXCISIONAL BIOPSY Left    benign  . BREAST SURGERY Bilateral    biopsies   . DILATION AND CURETTAGE OF UTERUS    . HYSTEROSCOPY    . LUMBAR  LAMINECTOMY/DECOMPRESSION MICRODISCECTOMY Left 08/20/2016   Procedure: Left Lumbar four-five Microdiskectomy;  Surgeon: Leeroy Cha, MD;  Location: [facility];  Service: Neurosurgery;  Laterality: Left;  . PARTIAL HYSTERECTOMY  2013  . ROTATOR CUFF REPAIR Right    07/2013  . Suboccipital craniotomy    . THYROIDECTOMY     Precancerous lesion    Social History   Socioeconomic History  . Marital status: Married    Spouse name: Not on file  . Number of children: 4  . Years of education: 20  . Highest education level: Not on file  Occupational History    Employer: UNEMPLOYED  Social Needs  . Financial resource strain: Not on file  . Food insecurity:    Worry: Not on file    Inability: Not on file  . Transportation needs:    Medical: Not on file    Non-medical: Not on file  Tobacco Use  . Smoking status: Never Smoker  . Smokeless tobacco: Never Used  Substance and Sexual Activity  . Alcohol use: No    Alcohol/week: 0.0 standard drinks  . Drug use: No  . Sexual activity: Not on file    Comment: hysterectomy  Lifestyle  . Physical activity:    Days per week: Not on file    Minutes per session: Not on file  . Stress: Not on file  Relationships  . Social connections:    Talks on phone: Not on file    Gets together: Not on file    Attends religious service: Not on file    Active member of club or organization: Not on file    Attends meetings of clubs or organizations: Not on file    Relationship status: Not on file  . Intimate partner violence:    Fear of current or ex partner: Not on file    Emotionally abused: Not on file    Physically abused: Not on file    Forced sexual activity: Not on file  Other Topics Concern  . Not on file  Social History Narrative   Lives in [place] with 2 sons and her husband;disabled; no use of tobacco products nor alcohol.   Patient given diabetes card on 12/05/2010.      Patient drinks 2 glasses of tea daily.   Patient is right  handed.     Family History  Problem Relation Age of Onset  . Heart disease Mother        questionable CAD and arrythmias   . Cancer Mother        Breast cancer  . Venous thrombosis Sister   . Diabetes Sister   . Heart disease Sister   . Diabetes Brother   . Heart disease Brother   . Cancer Maternal Aunt   .  Venous thrombosis Sister   . Diabetes Sister   . Heart disease Sister     ROS: Left ankle and back pain but no fevers or chills, productive cough, hemoptysis, dysphasia, odynophagia, melena, hematochezia, dysuria, hematuria, rash, seizure activity, orthopnea, PND, pedal edema, claudication. Remaining systems are negative.  Physical Exam:   Blood pressure 134/72, pulse (!) 102, height 5\' 8"  (1.727 m), weight 220 lb (99.8 kg), last menstrual period 12/03/2011.  General:  Well developed/well nourished in NAD Skin warm/dry Patient not depressed No peripheral clubbing Back-normal HEENT-normal/normal eyelids Neck supple/normal carotid upstroke bilaterally; no bruits; no JVD; no thyromegaly chest - CTA/ normal expansion CV - RRR/normal S1 and S2; no murmurs, rubs or gallops;  PMI nondisplaced Abdomen -NT/ND, no HSM, no mass, + bowel sounds, no bruit 2+ femoral pulses, no bruits Ext-no edema, chords, 2+ DP, status post prior left ankle surgery. Neuro-grossly nonfocal  ECG -sinus tachycardia at a rate of 102, nonspecific ST changes.  Personally reviewed  A/P  1 chest pain-symptoms are atypical.  They have also longstanding.  We will arrange a stress nuclear study for risk stratification (she has a long history of diabetes mellitus and also will require surgery in the near future for her ankle).  2 hypertension-patient's blood pressure is controlled.  Continue present medications and follow.  3 hyperlipidemia-continue statin.  Lipids and liver monitored by primary care.  4 preoperative evaluation prior to ankle surgery-as outlined above we will arrange a stress nuclear  study for risk stratification prior to surgery.  5 history of long QT-not evident on today's electrocardiogram.  No history of syncope.   Kirk Ruths, MD

## 2018-11-23 ENCOUNTER — Other Ambulatory Visit: Payer: Self-pay | Admitting: Neurology

## 2018-11-26 ENCOUNTER — Encounter: Payer: Self-pay | Admitting: Podiatry

## 2018-11-29 ENCOUNTER — Telehealth: Payer: Self-pay | Admitting: Podiatry

## 2018-11-29 DIAGNOSIS — R609 Edema, unspecified: Secondary | ICD-10-CM

## 2018-11-29 DIAGNOSIS — M79662 Pain in left lower leg: Secondary | ICD-10-CM

## 2018-11-29 NOTE — Telephone Encounter (Signed)
Pt had MRI done several weeks ago and has not heard anything regarding her results. Pts ankle is still swelling and would like to know what the next step is.

## 2018-11-30 ENCOUNTER — Encounter (INDEPENDENT_AMBULATORY_CARE_PROVIDER_SITE_OTHER): Payer: Self-pay

## 2018-11-30 ENCOUNTER — Encounter: Payer: Self-pay | Admitting: Cardiology

## 2018-11-30 ENCOUNTER — Ambulatory Visit (INDEPENDENT_AMBULATORY_CARE_PROVIDER_SITE_OTHER): Payer: Medicaid Other | Admitting: Cardiology

## 2018-11-30 VITALS — BP 134/72 | HR 102 | Ht 68.0 in | Wt 220.0 lb

## 2018-11-30 DIAGNOSIS — E7800 Pure hypercholesterolemia, unspecified: Secondary | ICD-10-CM

## 2018-11-30 DIAGNOSIS — I10 Essential (primary) hypertension: Secondary | ICD-10-CM | POA: Diagnosis not present

## 2018-11-30 DIAGNOSIS — R072 Precordial pain: Secondary | ICD-10-CM | POA: Diagnosis not present

## 2018-11-30 DIAGNOSIS — Z01818 Encounter for other preprocedural examination: Secondary | ICD-10-CM

## 2018-11-30 NOTE — Telephone Encounter (Signed)
I informed pt of Dr. Stephenie Acres review of results and pt states she has not been for lymphedema therapy.

## 2018-11-30 NOTE — Patient Instructions (Signed)
Medication Instructions:  NO CHANGE If you need a refill on your cardiac medications before your next appointment, please call your pharmacy.   Lab work: If you have labs (blood work) drawn today and your tests are completely normal, you will receive your results only by: Marland Kitchen MyChart Message (if you have MyChart) OR . A paper copy in the mail If you have any lab test that is abnormal or we need to change your treatment, we will call you to review the results.  Testing/Procedures: Your physician has requested that you have en exercise stress myoview. For further information please visit [email or web address]. Please follow instruction sheet, as given.  TAKE ALL MEDICATIONS  Follow-Up: At [facility], you and your health needs are our priority.  As part of our continuing mission to provide you with exceptional heart care, we have created designated Provider Care Teams.  These Care Teams include your primary Cardiologist (physician) and Advanced Practice Providers (APPs -  Physician Assistants and Nurse Practitioners) who all work together to provide you with the care you need, when you need it.  Your physician recommends that you schedule a follow-up appointment in: AS NEEDED PENDING TEST RESULTS

## 2018-11-30 NOTE — Telephone Encounter (Signed)
Looks ok.  Tendinosis of the PT with sub Q edema.  Has he been to the lymphedema clinic?  No tears in tendon only a fibrosis of the subtalar joint.

## 2018-12-02 NOTE — Addendum Note (Signed)
Addended by: Harriett Sine D on: 12/02/2018 04:28 PM   Modules accepted: Orders

## 2018-12-02 NOTE — Telephone Encounter (Signed)
Faxed orders for Lymphedema therapy to [facility].

## 2018-12-04 ENCOUNTER — Other Ambulatory Visit: Payer: Self-pay | Admitting: Internal Medicine

## 2018-12-04 ENCOUNTER — Other Ambulatory Visit: Payer: Self-pay | Admitting: Neurology

## 2018-12-07 ENCOUNTER — Telehealth (HOSPITAL_COMMUNITY): Payer: Self-pay

## 2018-12-07 NOTE — Telephone Encounter (Signed)
Encounter complete. 

## 2018-12-08 ENCOUNTER — Telehealth (HOSPITAL_COMMUNITY): Payer: Self-pay

## 2018-12-08 NOTE — Telephone Encounter (Signed)
Encounter complete. 

## 2018-12-09 ENCOUNTER — Ambulatory Visit (HOSPITAL_COMMUNITY)
Admission: RE | Admit: 2018-12-09 | Discharge: 2018-12-09 | Disposition: A | Discharge status: Home / Self Care | Payer: Medicaid Other | Source: Ambulatory Visit | Attending: Cardiovascular Disease | Admitting: Cardiovascular Disease

## 2018-12-09 DIAGNOSIS — R072 Precordial pain: Secondary | ICD-10-CM

## 2018-12-09 DIAGNOSIS — Z01818 Encounter for other preprocedural examination: Secondary | ICD-10-CM | POA: Insufficient documentation

## 2018-12-09 LAB — MYOCARDIAL PERFUSION IMAGING
Estimated workload: 7 METS
Exercise duration (min): 6 min
Exercise duration (sec): 0 s
LV dias vol: 113 mL (ref 46–106)
LV sys vol: 64 mL
MPHR: 169 {beats}/min
Peak HR: 162 {beats}/min
Percent HR: 95 %
RPE: 19
Rest HR: 98 {beats}/min
SDS: 2
SRS: 4
SSS: 6
TID: 1.09

## 2018-12-09 MED ORDER — TECHNETIUM TC 99M TETROFOSMIN IV KIT
10.8000 | PACK | Freq: Once | INTRAVENOUS | Status: AC | PRN
  Administered 2018-12-09: 10.8 via INTRAVENOUS
  Filled 2018-12-09: qty 11

## 2018-12-09 MED ORDER — REGADENOSON 0.4 MG/5ML IV SOLN: 0.4000 mg | Freq: Once | INTRAVENOUS | Status: DC

## 2018-12-09 MED ORDER — TECHNETIUM TC 99M TETROFOSMIN IV KIT
32.6000 | PACK | Freq: Once | INTRAVENOUS | Status: AC | PRN
  Administered 2018-12-09: 32.6 via INTRAVENOUS
  Filled 2018-12-09: qty 33

## 2018-12-10 ENCOUNTER — Other Ambulatory Visit: Payer: Self-pay

## 2018-12-10 ENCOUNTER — Telehealth: Payer: Self-pay | Admitting: *Deleted

## 2018-12-10 ENCOUNTER — Ambulatory Visit: Payer: Medicaid Other | Admitting: Internal Medicine

## 2018-12-10 VITALS — BP 159/84 | HR 106 | Temp 98.1°F | Ht 68.0 in | Wt 227.7 lb

## 2018-12-10 DIAGNOSIS — G8929 Other chronic pain: Secondary | ICD-10-CM | POA: Diagnosis not present

## 2018-12-10 DIAGNOSIS — M545 Low back pain: Secondary | ICD-10-CM | POA: Diagnosis not present

## 2018-12-10 DIAGNOSIS — R202 Paresthesia of skin: Secondary | ICD-10-CM | POA: Diagnosis not present

## 2018-12-10 DIAGNOSIS — D8689 Sarcoidosis of other sites: Secondary | ICD-10-CM | POA: Diagnosis not present

## 2018-12-10 DIAGNOSIS — D869 Sarcoidosis, unspecified: Secondary | ICD-10-CM

## 2018-12-10 DIAGNOSIS — R9439 Abnormal result of other cardiovascular function study: Secondary | ICD-10-CM

## 2018-12-10 DIAGNOSIS — I10 Essential (primary) hypertension: Secondary | ICD-10-CM

## 2018-12-10 DIAGNOSIS — E119 Type 2 diabetes mellitus without complications: Secondary | ICD-10-CM | POA: Diagnosis not present

## 2018-12-10 DIAGNOSIS — M5416 Radiculopathy, lumbar region: Secondary | ICD-10-CM | POA: Diagnosis not present

## 2018-12-10 DIAGNOSIS — R200 Anesthesia of skin: Secondary | ICD-10-CM | POA: Insufficient documentation

## 2018-12-10 DIAGNOSIS — M7989 Other specified soft tissue disorders: Principal | ICD-10-CM

## 2018-12-10 DIAGNOSIS — M79662 Pain in left lower leg: Secondary | ICD-10-CM

## 2018-12-10 DIAGNOSIS — E039 Hypothyroidism, unspecified: Secondary | ICD-10-CM

## 2018-12-10 HISTORY — DX: Anesthesia of skin: R20.0

## 2018-12-10 MED ORDER — DULOXETINE HCL 30 MG PO CPEP: 30.0000 mg | ORAL_CAPSULE | Freq: Every day | ORAL | 1 refills | Status: DC

## 2018-12-10 NOTE — Assessment & Plan Note (Addendum)
Patient with 2-3 month h/o of left sided painful paraesthesias worse at night. On exam, she had subjective paraesthesias across multiple dermatomes in upper and lower extremities; no other focal neurological deficits noted.   With patient's history of neurosarcoidosis, and diffuse nature of symptoms, there is concern for intracranial process.  Plan: --obtain MRI brain w/wo contrast --patient already has appt with neuro in February, based on imaging results, we may need to move up the appointment --patient declined TCAs due to possible SEs to help with this likely neuropathic pain; she is willing to try duloxetine. Will start at 30mg  daily and increase in 1 wk to 60mg  daily if tolerating well and still in need of pain relief

## 2018-12-10 NOTE — Assessment & Plan Note (Addendum)
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 Last MRI brain in 2018 revealed stable foci of T2 hyperintensities near the right frontal horn of lateral ventricle and adjacent to the 4th ventricle.  Plan: --MRI brain w/wo contrast --f/u CBC/Cmet

## 2018-12-10 NOTE — Progress Notes (Signed)
Internal Medicine Clinic Attending  Case discussed with Dr. Svalina  at the time of the visit.  We reviewed the resident's history and exam and pertinent patient test results.  I agree with the assessment, diagnosis, and plan of care documented in the resident's note.  

## 2018-12-10 NOTE — Patient Instructions (Signed)
I've ordered labs and repeat brain imaging today. You should get called about an appointment for the brain MRI in the next couple of days; if not please call our clinic back.  For your foot, continue elevating it when able, using ice. I would start using compression stockings.  We'll start a medicine to help with your pain.

## 2018-12-10 NOTE — Progress Notes (Signed)
CC: left sided pain  HPI:  [name] [name] is a 53 y.o. with a PMH of HTN, T2DM, hypothyroidism, neurosarcoidosis presenting to clinic for left sided pain and left foot swelling.  Patient is s/p repair of partial tendon tear of L tibialis mm with podiatry June 2019. Her recovery was complicated by prolonged pain, swelling and erythema of the foot, ankle, and calf. Workup included negative DVT study and repeat MRI ankle in Dec 2019 which revealed severe chronic posterior tibial tendinopathy; some minimal bone edema thought to be artifact after surgery. Today she states that pain and swelling had more localized to the ankle; swelling improves with elevation and worsens throughout the day.   Patient has also had chronic low back pain that continued despite back surgery in 2007, associated with left leg radiculopathy. She notes a lump in her bottom which is painful.  Finally for the last 2-3 months patient has had progressive painful paraesthesias on the entire left side of her body that are worst at night. It is minimally relieved with high doses of ibuprofen (up to 1200mg  at a time) for about 2 hours then pain resumes. Pain is described as burning, sharp, and aching in nature. She denies weakness, bowel or bladder incontinence, trauma. She denies vision or hearing changes, dysphagia, dysarthria, loss of balance.   She has also had some intermittent chest pain for which she was evaluated by cardiology - stress test yesterday 1/9 was negative for inducible ischemia; from note review it appears she will undergo echo in the future.   Please see problem based Assessment and Plan for status of patients chronic conditions.  Past Medical History:  Diagnosis Date  . Abdominal pain 06/25/2010   CT abd/pelvis 05/2014: Very large amount of stool noted distending the colon. The size consistent with severe constipation/fecal impaction.    . Abnormal cervical Papanicolaou smear 04/17/2017  . Anemia   .  Arthritis    lumbar region- radiculopathy  . Cancer [facility]) 2013    Thyroid Ca-thyroidectomy  . Chronic fatigue   . DM (diabetes mellitus) ([place])   . Gait disturbance   . GERD (gastroesophageal reflux disease)   . Headache(784.0)   . History of osteoporosis 05/25/2007   DEXA 2014 Since the baseline examination in 2002, there has been a  statistically significant 19.5% increase in bone mineral density in  the lumbar spine and a statistically significant 14.6% increase in  bone mineral density in the left femur.   DEXA 2002 with osteoporosis of lumbar spine and moderate osteopenia of left hip MRI 2002 left hip negative for avascular necrosis  Qualifier: Diagnosis o  . HTN (hypertension)   . Hypothyroidism   . Insomnia 01/02/2015  . Intermittent left-sided chest pain 01/26/2013  . Nephrolithiasis   . Obesity   . OSA (obstructive sleep apnea)    last test- many yrs. ago, done at [facility], no CPAP in use  . Osteoporosis   . Peripheral neuropathy   . Prolonged QT interval   . 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 HEENT- Atraumatic, normocephalic, PERRL, EOMI, oral mucosa  appears moist. CARDIAC- RRR, no murmurs, rubs or gallops. RESP- Moving equal volumes of air, and clear to auscultation bilaterally, no wheezes or crackles. ABDOMEN- Soft, nontender, bowel sounds present. NEURO- CN 2-12 intact, strength intact throughout, subjective decreased sensation in left extremities across multiple dermatomes. Strength intact throughout. EXTREMITIES- pulse 2+ TP/DP/radial, symmetric. Surgical scar distal to left medial malleolus with surrounding dependent edema and mild erythema. FROM in bil  UEs, anterior shoulder pain with internal rotation and flexion of the LUE; pain at anterior shoulder with movement of LUE across chest. SKIN- Warm, dry, no rash or lesion. PSYCH- Normal mood and affect, appropriate thought content and speech.  Assessment & Plan:   See Encounters Tab for problem based charting.   Patient discussed with Dr. Nilsa Nutting, MD Internal Medicine PGY-3

## 2018-12-10 NOTE — Assessment & Plan Note (Signed)
Patient's symptoms consistent with post-surgical swelling and likely continued tendinopathy based on MRI.  Plan: --advised she continue leg elevation, ice and start using compression stockings

## 2018-12-10 NOTE — Telephone Encounter (Addendum)
pt aware of results and echo scheduled  ----- Message from Lelon Perla, MD sent at 12/09/2018  5:05 PM EST ----- No ischemia or infarction; check echo for LV function [name] [name]

## 2018-12-11 LAB — CMP14 + ANION GAP
ALK PHOS: 93 IU/L (ref 39–117)
ALT: 21 IU/L (ref 0–32)
AST: 17 IU/L (ref 0–40)
Albumin/Globulin Ratio: 1.8 (ref 1.2–2.2)
Albumin: 4.5 g/dL (ref 3.5–5.5)
Anion Gap: 20 mmol/L — ABNORMAL HIGH (ref 10.0–18.0)
BUN/Creatinine Ratio: 20 (ref 9–23)
BUN: 13 mg/dL (ref 6–24)
CHLORIDE: 103 mmol/L (ref 96–106)
CO2: 19 mmol/L — ABNORMAL LOW (ref 20–29)
Calcium: 9.5 mg/dL (ref 8.7–10.2)
Creatinine, Ser: 0.65 mg/dL (ref 0.57–1.00)
GFR calc Af Amer: 119 mL/min/{1.73_m2} (ref >59)
GFR calc non Af Amer: 103 mL/min/{1.73_m2} (ref >59)
Globulin, Total: 2.5 g/dL (ref 1.5–4.5)
Glucose: 170 mg/dL — ABNORMAL HIGH (ref 65–99)
POTASSIUM: 4.3 mmol/L (ref 3.5–5.2)
Sodium: 142 mmol/L (ref 134–144)
Total Protein: 7 g/dL (ref 6.0–8.5)

## 2018-12-11 LAB — CBC
Hematocrit: 35 % (ref 34.0–46.6)
Hemoglobin: 11.6 g/dL (ref 11.1–15.9)
MCH: 27.4 pg (ref 26.6–33.0)
MCHC: 33.1 g/dL (ref 31.5–35.7)
MCV: 83 fL (ref 79–97)
Platelets: 340 10*3/uL (ref 150–450)
RBC: 4.24 x10E6/uL (ref 3.77–5.28)
RDW: 14.4 % (ref 11.7–15.4)
WBC: 5.9 10*3/uL (ref 3.4–10.8)

## 2018-12-13 ENCOUNTER — Other Ambulatory Visit: Payer: Self-pay

## 2018-12-13 ENCOUNTER — Ambulatory Visit (HOSPITAL_COMMUNITY): Payer: Medicaid Other | Attending: Cardiovascular Disease

## 2018-12-13 DIAGNOSIS — R9439 Abnormal result of other cardiovascular function study: Secondary | ICD-10-CM

## 2018-12-23 ENCOUNTER — Other Ambulatory Visit: Payer: Self-pay

## 2018-12-23 ENCOUNTER — Ambulatory Visit: Payer: Medicaid Other | Attending: Podiatry | Admitting: Occupational Therapy

## 2018-12-23 ENCOUNTER — Encounter: Payer: Self-pay | Admitting: Occupational Therapy

## 2018-12-23 VITALS — Ht 68.0 in | Wt 220.0 lb

## 2018-12-23 DIAGNOSIS — I890 Lymphedema, not elsewhere classified: Secondary | ICD-10-CM | POA: Diagnosis present

## 2018-12-25 ENCOUNTER — Other Ambulatory Visit: Payer: Self-pay | Admitting: Internal Medicine

## 2018-12-25 ENCOUNTER — Other Ambulatory Visit: Payer: Self-pay | Admitting: Student in an Organized Health Care Education/Training Program

## 2018-12-25 DIAGNOSIS — E890 Postprocedural hypothyroidism: Secondary | ICD-10-CM

## 2018-12-28 ENCOUNTER — Ambulatory Visit (HOSPITAL_COMMUNITY): Admission: RE | Admit: 2018-12-28 | Payer: Medicaid Other | Source: Ambulatory Visit

## 2018-12-30 NOTE — Therapy (Signed)
[place] MAIN [facility] SERVICES [address] [place], [place], [address] Phone: [phone]   Fax:  [phone]  Occupational Therapy Evaluation  Patient Details  Name: [name] [name] MRN: [id number] Date of Birth: [birth year] Referring Provider (OT): Tyson Dense, [place]   Encounter Date: 12/23/2018  OT End of Session - 12/30/18 1443    Visit Number  1    Number of Visits  36    Date for OT Re-Evaluation  03/23/19    OT Start Time  0100    OT Stop Time  0210    OT Time Calculation (min)  70 min    Activity Tolerance  Patient tolerated treatment well;No increased pain;Patient limited by pain    Behavior During Therapy  [facility] for tasks assessed/performed       Past Medical History:  Diagnosis Date  . Abdominal pain 06/25/2010   CT abd/pelvis 05/2014: Very large amount of stool noted distending the colon. The size consistent with severe constipation/fecal impaction.    . Abnormal cervical Papanicolaou smear 04/17/2017  . Anemia   . Arthritis    lumbar region- radiculopathy  . Cancer [facility]) 2013    Thyroid Ca-thyroidectomy  . Chronic fatigue   . DM (diabetes mellitus) ([place])   . Gait disturbance   . GERD (gastroesophageal reflux disease)   . Headache(784.0)   . History of osteoporosis 05/25/2007   DEXA 2014 Since the baseline examination in 2002, there has been a  statistically significant 19.5% increase in bone mineral density in  the lumbar spine and a statistically significant 14.6% increase in  bone mineral density in the left femur.   DEXA 2002 with osteoporosis of lumbar spine and moderate osteopenia of left hip MRI 2002 left hip negative for avascular necrosis  Qualifier: Diagnosis o  . HTN (hypertension)   . Hypothyroidism   . Insomnia 01/02/2015  . Intermittent left-sided chest pain 01/26/2013  . Nephrolithiasis   . Obesity   . OSA (obstructive sleep apnea)    last test- many yrs. ago, done at [facility], no CPAP in use  . Osteoporosis   .  Peripheral neuropathy   . Prolonged QT interval   . 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 BRAIN SURGERY  2001   for sarcoidosis  . BREAST BIOPSY Left   . BREAST BIOPSY Right   . BREAST EXCISIONAL BIOPSY Right    x2 benign  . BREAST EXCISIONAL BIOPSY Left    benign  . BREAST SURGERY Bilateral    biopsies   . DILATION AND CURETTAGE OF UTERUS    . HYSTEROSCOPY    . LUMBAR LAMINECTOMY/DECOMPRESSION MICRODISCECTOMY Left 08/20/2016   Procedure: Left Lumbar four-five Microdiskectomy;  Surgeon: Leeroy Cha, MD;  Location: [place] NEURO ORS;  Service: Neurosurgery;  Laterality: Left;  . PARTIAL HYSTERECTOMY  2013  . ROTATOR CUFF REPAIR Right    07/2013  . Suboccipital craniotomy    . THYROIDECTOMY     Precancerous lesion    Vitals:   12/23/18 1334  Weight: 220 lb (99.8 kg)  Height: 5\' 8"  (1.727 m)       [facility] OT Assessment - 12/30/18 0001      Assessment   Medical Diagnosis  mild, stage 2, BLE lymphedema 2/2 suspected venous insufficiency.Additional contributing factors include obesity, chronic pain  (inflamation?) 2/2  sciatica     Referring Provider (OT)  Tyson Dense, DPM  Onset Date/Surgical Date  --   07/2018   Prior Therapy  no CDT, no compression      Precautions   Precautions  --   DM skin precautions   Precaution Comments  NO STROKES TO LATERAL NECK 2/2 hypothyroidism      Restrictions   Weight Bearing Restrictions  No      Balance Screen   Has the patient fallen in the past 6 months  No      Home  Environment   Bathroom Shower/Tub  Tub/Shower unit    Lives With  Significant other      Prior Function   Level of Independence  Independent;Independent with basic ADLs;Independent with household mobility without device;Independent with community mobility without device;Independent with homemaking with ambulation;Independent with transfers;Independent with gait    Vocation  Unemployed     Leisure  friends      IADL   Prior Level of Function Shopping  I    Air cabin crew independently for [employer]    Prior Level of Function Light Housekeeping  I    Light Housekeeping  Does personal laundry completely    Prior Level of Function Meal Prep  I    Meal Prep  Plans, prepares and serves adequate meals independently    Prior Level of Landscape architect  I    Investment banker, corporate own Art therapist Status  Independent    Mobility Status Comments  difficulty performing car transfers and bed mobility 2/2 LLE/ LLQ pain      Cognition   Overall Cognitive Status  Within Functional Limits for tasks assessed      Observation/Other Assessments   Outcome Measures  Lymphedema Life Impact Scale (LLIS), comparative limb volumetrics, functional outcomes      Posture/Postural Control   Posture/Postural Control  No significant limitations      Coordination   Gross Motor Movements are Fluid and Coordinated  Yes    Fine Motor Movements are Fluid and Coordinated  Yes      ROM / Strength   AROM / PROM / Strength  AROM;Strength      AROM   Overall AROM   Within functional limits for tasks performed      Strength   Overall Strength  Within functional limits for tasks performed      Hand Function   Right Hand Gross Grasp  Functional     Mild, Stage II, LLE lymphedema  2/2 suspected CVI, with obesityand OSA contributing Skin Description Hyper-Keratosis Peau' de Orange Shiny Tight Fibrotic Fatty Doughy Indurated     ankle x Slight fatty fibrosis at L ankle      Hydration Dry Flaky Erythema Macerated   mild      Color Redness Present Pallor Blanching Hemosiderin Staining Other   Xdistal leg and L fankle and foot  x      Odor Malodorous Yeast  Absent      x   Temperature Warm Cool wnl    x    Pitting Edema   1+ 2+ 3+ 4+ Non-pitting    x        Girth Symmetrical Asymmetrical Other Distribution    L>R Toes to groin, denies genital  involvement   Stemmer Sign Positive Negative     Positive bilaterally, L>R    Lymphorrea History Of:  Present Absent      x   Wounds History Of Present Absent Venous Arterial  Pressure Size    denies            Signs of Infection Redness Warmth Erythema Acute Swelling Drainage Borders                   Scars Adhesions Hypersensitivity        Sensation Light Touch Deep pressure Hypersensitivty   Present Impaired Present Impaired Absent Impaired   x   x   Mild distal LLE and foot  x  Nails WNL Fungus Present Other   x     Hair Growth Symmetrical Asymmetrical    TBA   Skin Creases Base of toes Ankle Base of Finger Medial Thigh          x            LYMPHEDEMA/ONCOLOGY QUESTIONNAIRE - 12/30/18 1431      Lymphedema Assessments   Lymphedema Assessments  Lower extremities             OT Treatments/Exercises (OP) - 12/30/18 0001      Transfers   Transfers  Sit to Stand;Stand to Sit    Sit to Stand  6: Modified independent (Device/Increase time);With armrests    Stand to Sit  7: Independent      ADLs   LB Dressing  LLE swelling limits  ability to fit preferred street shoes    Functional Mobility  LLE pain and swelling limits car transfers and bed mobility    Cooking  cooking , meal prep and other mone management activities limited by pain and increased swelling with standing, walking and extended sitting > 30 minutes    Leisure  limited when involves standing , walking and dependent sitting > 30 mins    ADL Education Given  Yes      Manual Therapy   Manual Therapy  Edema management    Manual therapy comments  comparative BLE limb volumetrics TBA at initial CDT Rx viasit. By visual assessment estimate L>R by ~ 10%            OT Education - 12/30/18 1438    Education Details  Provided Pt and family skilled education regarding lymphatic structure and function, lymphedema etiologies, onset patterns and stages of progression. Discussed  impact of  obesity on lymphatic system function. Outlined Complete Decongestive Therapy (CDT)  as standard of LE care and provided in depth information regarding 4 primary components of both Intensive and Self Management Phases, including Manual Lymph Drainage (MLD), compression wrapping and garments, skin care, and therapeutic exercise.   Discussed   Importance of daily, ongoing LE self-care essential to retaining clinical gains and limiting progression.  Lastly, reviewed lymphedema precautions, including cellulitis risk and difficulty with wound healing. Provided printed Lymphedema Workbook for reference.    Person(s) Educated  Patient;Other (comment)    Methods  Explanation;Demonstration;Handout    Comprehension  Verbalized understanding;Returned demonstration;Need further instruction          OT Long Term Goals - 12/30/18 1448      OT LONG TERM GOAL #1   Title  Pt will demonstrate understanding of lymphedema (LE) precautions / prevention principals, including signs / symptoms of cellulitis infection with modified independence using  by using LE Workbook as reference to identify 6 precautions without verbal cues by end of 3rd  OT Rx visit.     Baseline  max A    Time  3    Period  Days    Status  New  Target Date  --   3rd OT Rx visit     OT LONG TERM GOAL #2   Title  Pt will be able to apply LLE, knee length, multi-layer, short stretch compression wraps using correct gradient techniques with min CG assist to achieve optimal limb volume reduction, and to return affected limb , as closely as possible, to premorbid size and shape.    Baseline  dependent    Time  2    Period  Weeks    Status  New    Target Date  --   4th OT Rx visit     OT LONG TERM GOAL #3   Title  Pt to achieve no less than 5%  limb volume reduction in affected LLE during Intensive Phase CDT to improve functional ambulation and mobility,  to improve tissue integrity and reduce infection risk.    Baseline  Max A    Time  12     Period  Weeks    Status  New    Target Date  03/23/19      OT LONG TERM GOAL #4   Title  Pt will achieve and sustain no less than 85% compliance with all daily LE self-care home program components throughout Intensive Phase CDT, including daily  impeccable skin care, daily simple self-MLD, daily  gradient compression wrapping, and therapeutic exercise to ensure optimal limb volume reduction and swelling control, infection risk reduction, and  to expedite compression garment/ device fitting to improve functional mobility and ambulation.    Baseline  Max A    Time  12    Period  Weeks    Status  New    Target Date  03/23/19      OT LONG TERM GOAL #5   Title  Pt will be able to don and doff appropriate compression garments with modified independence ( extra time) on issue date for optimal independence with daily compression component of LE self care.    Baseline  Max A    Time  12    Period  Weeks    Target Date  03/23/19      Long Term Additional Goals   Additional Long Term Goals  Yes      OT LONG TERM GOAL #6   Title  During self-management phase of CDT Pt will retain limb volume reductions achieved during Intensive Phase CDT with no more than 2% volume increase with ongoing CG assistance to limit LE progression and further functional decline.    Baseline  Max A    Time  6    Period  Months    Status  New    Target Date  06/28/19            Plan - 12/30/18 1445    Clinical Impression Statement  [name] [name] is a 53 yo female presenting with mild, stage II, LLE lymphedema (LE) secondary to suspected CVI. Other intrinsic factors contributing to leg swelling include obesity, HTN, and hx untreated OSA. LLE chronic swelling and associated pain limits ambulation and functional mobility, social participation, body image and intimacy, as well as functional performance in all occupational domains. [name] [name] will benefit from skilled Occupational Therapy for Complete Decongestive  Therapy (CDT) to include manual lymphatic drainage, skin care, therapeutic exercise and compression therapy to reduce leg swelling and chronic pain, to reduce infection risk and to reduce LE progression. Emphasis throughout OT course will be on Pt education to facilitate long term LE self-management.  Without skilled OT for CDT LE will progress resulting in worsening condition, ongoing infection risk and further functional decline.    Occupational Profile and client history currently impacting functional performance  chronic fatigue    Occupational performance deficits (Please refer to evaluation for details):  ADL's;Work;IADL's;Rest and Sleep;Leisure;Social Participation;Other   body image   Rehab Potential  Good    Current Impairments/barriers affecting progress:  decreased coping skills; good family support    OT Frequency  2x / week    OT Duration  12 weeks    OT Treatment/Interventions  Self-care/ADL training;Therapeutic exercise;Energy conservation;Functional Mobility Training;Manual lymph drainage;Therapeutic activities;Coping strategies training;Patient/family education;Compression bandaging;Manual Therapy;DME and/or AE instruction    Clinical Decision Making  Several treatment options, min-mod task modification necessary    Recommended Other Services  fit w/ appropriate compression garments. Consider OTS Juzo 2001 or 2002 ( 20-30, or  30-40 mmHg) or compression anklet    Consulted and Agree with Plan of Care  Patient;Family member/caregiver       Patient will benefit from skilled therapeutic intervention in order to improve the following deficits and impairments:  Decreased knowledge of use of DME, Decreased skin integrity, Increased edema, Impaired sensation, Impaired perceived functional ability, Decreased mobility, Decreased knowledge of precautions, Obesity, Decreased coping skills, Pain, Difficulty walking  Visit Diagnosis: Lymphedema, not elsewhere classified - Plan: Ot plan of care  cert/re-cert    Problem List Patient Active Problem List   Diagnosis Date Noted  . Left sided numbness 12/10/2018  . Pain and swelling of lower leg, left 12/16/2017  . Obesity 11/24/2016  . Herniated lumbar disc without myelopathy 08/20/2016  . Healthcare maintenance 02/18/2016  . Insomnia 01/02/2015  . Intractable migraine without aura 02/09/2013  . Hypothyroidism 05/08/2011  . FIBROIDS, UTERUS 11/06/2010  . HYPERLIPIDEMIA 01/28/2010  . Sciatica of left side associated with disorder of lumbar spine 10/20/2007  . 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 Diabetes mellitus type 2, controlled ([place]) 05/25/2007  . OBSTRUCTIVE SLEEP APNEA 05/25/2007  . Essential hypertension 05/25/2007  . Gastroesophageal reflux disease 05/25/2007  . LONG QT SYNDROME 05/25/2007    Ansel Bong 12/30/2018, 3:19 PM  [place] MAIN [facility] SERVICES [address] [place], [place], [address] Phone: [phone]   Fax:  [phone]  Name: [name] [name] MRN: [id number] Date of Birth: 11/04/1966

## 2019-01-03 ENCOUNTER — Other Ambulatory Visit: Payer: Self-pay | Admitting: Internal Medicine

## 2019-01-03 DIAGNOSIS — D869 Sarcoidosis, unspecified: Secondary | ICD-10-CM

## 2019-01-11 ENCOUNTER — Telehealth: Payer: Self-pay | Admitting: Neurology

## 2019-01-11 ENCOUNTER — Ambulatory Visit: Payer: Medicaid Other | Admitting: Occupational Therapy

## 2019-01-11 MED ORDER — DEXAMETHASONE 2 MG PO TABS: ORAL_TABLET | ORAL | 0 refills | Status: DC

## 2019-01-11 NOTE — Telephone Encounter (Signed)
Pt has called to inform she has had a headache for about 3 days and she is asking if Dr Jannifer Franklin will call in something for her.  Pt still uses [id number]

## 2019-01-11 NOTE — Telephone Encounter (Signed)
I called the patient.  The patient's had a headache for about 3 days.  The patient will be given a 3-day course of Decadron, she does have diabetes but she is on glipizide and metformin for at this point.  The prescription will only be for 3 days.

## 2019-01-11 NOTE — Addendum Note (Signed)
Addended by: Kathrynn Ducking on: 01/11/2019 11:44 AM   Modules accepted: Orders

## 2019-01-15 ENCOUNTER — Ambulatory Visit (HOSPITAL_COMMUNITY)
Admission: RE | Admit: 2019-01-15 | Discharge: 2019-01-15 | Disposition: A | Discharge status: Home / Self Care | Payer: Medicaid Other | Source: Ambulatory Visit | Attending: Student in an Organized Health Care Education/Training Program | Admitting: Student in an Organized Health Care Education/Training Program

## 2019-01-15 DIAGNOSIS — D869 Sarcoidosis, unspecified: Secondary | ICD-10-CM | POA: Insufficient documentation

## 2019-01-15 MED ORDER — GADOBUTROL 1 MMOL/ML IV SOLN
10.0000 mL | Freq: Once | INTRAVENOUS | Status: AC | PRN
  Administered 2019-01-15: 10 mL via INTRAVENOUS

## 2019-01-17 ENCOUNTER — Ambulatory Visit (HOSPITAL_COMMUNITY): Payer: Medicaid Other

## 2019-01-18 ENCOUNTER — Ambulatory Visit: Payer: Medicaid Other | Attending: Podiatry | Admitting: Occupational Therapy

## 2019-01-18 DIAGNOSIS — I890 Lymphedema, not elsewhere classified: Secondary | ICD-10-CM | POA: Diagnosis not present

## 2019-01-18 NOTE — Therapy (Signed)
[place] MAIN [facility] SERVICES [address] [place], [place], [address] Phone: [phone]   Fax:  [phone]  Occupational Therapy Treatment  Patient Details  Name: [name] [name] MRN: [id number] Date of Birth: [birth year] Referring Provider (OT): Tyson Dense, [place]   Encounter Date: 01/18/2019  OT End of Session - 01/18/19 1707    Visit Number  2    Number of Visits  36    Date for OT Re-Evaluation  03/23/19    OT Start Time  0105    OT Stop Time  0220    OT Time Calculation (min)  75 min    Activity Tolerance  Patient tolerated treatment well;No increased pain;Patient limited by pain    Behavior During Therapy  [facility] for tasks assessed/performed       Past Medical History:  Diagnosis Date  . Abdominal pain 06/25/2010   CT abd/pelvis 05/2014: Very large amount of stool noted distending the colon. The size consistent with severe constipation/fecal impaction.    . Abnormal cervical Papanicolaou smear 04/17/2017  . Anemia   . Arthritis    lumbar region- radiculopathy  . Cancer [facility]) 2013    Thyroid Ca-thyroidectomy  . Chronic fatigue   . DM (diabetes mellitus) ([place])   . Gait disturbance   . GERD (gastroesophageal reflux disease)   . Headache(784.0)   . History of osteoporosis 05/25/2007   DEXA 2014 Since the baseline examination in 2002, there has been a  statistically significant 19.5% increase in bone mineral density in  the lumbar spine and a statistically significant 14.6% increase in  bone mineral density in the left femur.   DEXA 2002 with osteoporosis of lumbar spine and moderate osteopenia of left hip MRI 2002 left hip negative for avascular necrosis  Qualifier: Diagnosis o  . HTN (hypertension)   . Hypothyroidism   . Insomnia 01/02/2015  . Intermittent left-sided chest pain 01/26/2013  . Nephrolithiasis   . Obesity   . OSA (obstructive sleep apnea)    last test- many yrs. ago, done at [facility], no CPAP in use  . Osteoporosis   .  Peripheral neuropathy   . Prolonged QT interval   . 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 BRAIN SURGERY  2001   for sarcoidosis  . BREAST BIOPSY Left   . BREAST BIOPSY Right   . BREAST EXCISIONAL BIOPSY Right    x2 benign  . BREAST EXCISIONAL BIOPSY Left    benign  . BREAST SURGERY Bilateral    biopsies   . DILATION AND CURETTAGE OF UTERUS    . HYSTEROSCOPY    . LUMBAR LAMINECTOMY/DECOMPRESSION MICRODISCECTOMY Left 08/20/2016   Procedure: Left Lumbar four-five Microdiskectomy;  Surgeon: Leeroy Cha, MD;  Location: [place] [facility];  Service: Neurosurgery;  Laterality: Left;  . PARTIAL HYSTERECTOMY  2013  . ROTATOR CUFF REPAIR Right    07/2013  . Suboccipital craniotomy    . THYROIDECTOMY     Precancerous lesion    There were no vitals filed for this visit.  Subjective Assessment - 01/18/19 1128    Subjective   [name] returns today with her significant other, [name], to commence OT for CDT to LLE. Had a frank conversation with Pt to reiterate realistic expectation for minimal sciatic nerve pain reduction w/ LE Rx.  Pt verbalized understanding.     Patient is accompained by:  Family member  Pertinent History  07/2018 onset LLE swelling and chronic pain s/p EPF and posterior tibial tendon; sarcoidosis w/ brain sx 2001; periferal neuropathy, OSA-no cPAP; obesity; hypothyroidism 2/2 thyroid ca; HTN; DM, chronic fatigue, lumbar region radiculopathy    Limitations  chronic LLE pain and swelling; impaired transfers, impaired standing and walking tolerance, difficulty fitting preferred footwear; limited ability to perform work and productive activities 2/2 leg pain and swelling; difficulty sleeping, limited leisure pursuits and social participation; impaired body image 2/2 LLE swelling    Repetition  Increases Symptoms    Special Tests  + Stemmer sign base of toes bilaerally    Currently in Pain?  Yes   foot and leg  pain unchanged from initial evaluation.    Pain Score  6     Pain Location  Leg    Pain Orientation  Left    Pain Descriptors / Indicators  Aching;Shooting;Tiring;Grimacing;Guarding;Pressure;Sore;Tightness;Heaviness;Discomfort;Penetrating   ankle to L hip   Pain Type  Chronic pain    Pain Radiating Towards  L buttock    Pain Onset  --   onset 07/2018   Pain Frequency  Constant    Aggravating Factors   standing, walking, sleeping in bed, extended sitting    Pain Relieving Factors  medication    Effect of Pain on Daily Activities  LLE pain limits functional performance in all occupational domains, including basic and instrumental; ADLs, leisure pursuits and productive activities and work, social participation and role performance. LLE swelling limits functional ambulation , bed mobility, car transfers, fitting preferred footwear, impairs body image    Multiple Pain Sites  Yes          LYMPHEDEMA/ONCOLOGY QUESTIONNAIRE - 01/18/19 1708      Right Lower Extremity Lymphedema   Other  RLE ( dominant  leg)  A-D ( ankle to tibial tuberosity) limb volume measures 3116.86 ml.       Left Lower Extremity Lymphedema   Other  LLE ( Rx leg)  A-D ( ankle to tibial tuberosity) limb volume measures 3089.96 ml.     Other  Limb volume differential measures 0.86%, RLE ( dominant) > LLE (Rx)              OT Treatments/Exercises (OP) - 01/18/19 0001      ADLs   ADL Education Given  Yes      Manual Therapy   Manual Therapy  Edema management;Compression Bandaging    Manual therapy comments  initial comparative limb volumetrics    Compression Bandaging  Short stretch , gradient compression wrap to L ankle only using single 8 cm wide x 5 meter long short stretch bandage over cotton stockinett and 0.4 mm Rosidal foam, including foot from base of toes to distal leg only. Minimal wrap applied today in effort to build tolerance as Pt is so painful. Minimal limb volume differential in B legs also  indicated majority of swelling is concentrated at L foot and ankle. Should flui need boost to reach popliteal LN, we'll extend compression to just below the knee.              OT Education - 01/18/19 1705    Education Details  Continued skilled Pt/caregiver education  And LE ADL training throughout visit for lymphedema self care/ home program, including compression wrapping, compression garment and device wear/care, lymphatic pumping ther ex, simple self-MLD, and skin care. Discussed progress towards goals.     Person(s) Educated  Patient;Other (comment)    Methods  Explanation;Demonstration;Handout  Comprehension  Verbalized understanding;Returned demonstration;Need further instruction          OT Long Term Goals - 12/30/18 1448      OT LONG TERM GOAL #1   Title  Pt will demonstrate understanding of lymphedema (LE) precautions / prevention principals, including signs / symptoms of cellulitis infection with modified independence using  by using LE Workbook as reference to identify 6 precautions without verbal cues by end of 3rd  OT Rx visit.     Baseline  max A    Time  3    Period  Days    Status  New    Target Date  --   3rd OT Rx visit     OT LONG TERM GOAL #2   Title  Pt will be able to apply LLE, knee length, multi-layer, short stretch compression wraps using correct gradient techniques with min CG assist to achieve optimal limb volume reduction, and to return affected limb , as closely as possible, to premorbid size and shape.    Baseline  dependent    Time  2    Period  Weeks    Status  New    Target Date  --   4th OT Rx visit     OT LONG TERM GOAL #3   Title  Pt to achieve no less than 5%  limb volume reduction in affected LLE during Intensive Phase CDT to improve functional ambulation and mobility,  to improve tissue integrity and reduce infection risk.    Baseline  Max A    Time  12    Period  Weeks    Status  New    Target Date  03/23/19      OT LONG TERM  GOAL #4   Title  Pt will achieve and sustain no less than 85% compliance with all daily LE self-care home program components throughout Intensive Phase CDT, including daily  impeccable skin care, daily simple self-MLD, daily  gradient compression wrapping, and therapeutic exercise to ensure optimal limb volume reduction and swelling control, infection risk reduction, and  to expedite compression garment/ device fitting to improve functional mobility and ambulation.    Baseline  Max A    Time  12    Period  Weeks    Status  New    Target Date  03/23/19      OT LONG TERM GOAL #5   Title  Pt will be able to don and doff appropriate compression garments with modified independence ( extra time) on issue date for optimal independence with daily compression component of LE self care.    Baseline  Max A    Time  12    Period  Weeks    Target Date  03/23/19      Long Term Additional Goals   Additional Long Term Goals  Yes      OT LONG TERM GOAL #6   Title  During self-management phase of CDT Pt will retain limb volume reductions achieved during Intensive Phase CDT with no more than 2% volume increase with ongoing CG assistance to limit LE progression and further functional decline.    Baseline  Max A    Time  6    Period  Months    Status  New    Target Date  06/28/19            Plan - 01/18/19 1712    Clinical Impression Statement  Limb volume differential measures 0.86%, RLE (  dominant) > LLE (Rx). Because limb swelling appears to be isolated in L foot and ankle vs extending into dstal leg, we took conservatve and gentle approach to compression wrapping today to introduce protocol and build tolerance n Pt with decreased  coping strategies for L ankle swelling and chronic LLE pain. Pt tolerated initial light wrap well in clinic without  complaints today. Cont as per POC. Next visit focvus on teaching CG to apply gradient compression wraps.    Occupational Profile and client history  currently impacting functional performance  chronic fatigue    Occupational performance deficits (Please refer to evaluation for details):  ADL's;Work;IADL's;Rest and Sleep;Leisure;Social Participation;Other   body image   Rehab Potential  Good    Current Impairments/barriers affecting progress:  decreased coping skills; good family support    OT Frequency  2x / week    OT Duration  12 weeks    OT Treatment/Interventions  Self-care/ADL training;Therapeutic exercise;Energy conservation;Functional Mobility Training;Manual lymph drainage;Therapeutic activities;Coping strategies training;Patient/family education;Compression bandaging;Manual Therapy;DME and/or AE instruction    Clinical Decision Making  Several treatment options, min-mod task modification necessary    Recommended Other Services  fit w/ appropriate compression garments. Consider OTS Juzo 2001 or 2002 ( 20-30, or  30-40 mmHg) or compression anklet    Consulted and Agree with Plan of Care  Patient;Family member/caregiver       Patient will benefit from skilled therapeutic intervention in order to improve the following deficits and impairments:  Decreased knowledge of use of DME, Decreased skin integrity, Increased edema, Impaired sensation, Impaired perceived functional ability, Decreased mobility, Decreased knowledge of precautions, Obesity, Decreased coping skills, Pain, Difficulty walking  Visit Diagnosis: Lymphedema, not elsewhere classified    Problem List Patient Active Problem List   Diagnosis Date Noted  . Left sided numbness 12/10/2018  . Pain and swelling of lower leg, left 12/16/2017  . Obesity 11/24/2016  . Herniated lumbar disc without myelopathy 08/20/2016  . Healthcare maintenance 02/18/2016  . Insomnia 01/02/2015  . Intractable migraine without aura 02/09/2013  . Hypothyroidism 05/08/2011  . FIBROIDS, UTERUS 11/06/2010  . HYPERLIPIDEMIA 01/28/2010  . Sciatica of left side associated with disorder of lumbar  spine 10/20/2007  . 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 Diabetes mellitus type 2, controlled ([place]) 05/25/2007  . OBSTRUCTIVE SLEEP APNEA 05/25/2007  . Essential hypertension 05/25/2007  . Gastroesophageal reflux disease 05/25/2007  . LONG QT SYNDROME 05/25/2007    Andrey Spearman, MS, OTR/L, [facility] 01/18/19 5:18 PM  [place] MAIN [facility] SERVICES [address] [place], [place], [address] Phone: [phone]   Fax:  [phone]  Name: NYSSA SAYEGH MRN: [id number] Date of Birth: 1966/03/31

## 2019-01-24 ENCOUNTER — Encounter: Payer: Self-pay | Admitting: Neurology

## 2019-01-24 ENCOUNTER — Ambulatory Visit: Payer: Medicaid Other | Admitting: Neurology

## 2019-01-24 VITALS — BP 147/88 | HR 91 | Ht 68.0 in | Wt 225.0 lb

## 2019-01-24 DIAGNOSIS — M79605 Pain in left leg: Secondary | ICD-10-CM | POA: Diagnosis not present

## 2019-01-24 DIAGNOSIS — G8929 Other chronic pain: Secondary | ICD-10-CM

## 2019-01-24 DIAGNOSIS — M25512 Pain in left shoulder: Secondary | ICD-10-CM | POA: Diagnosis not present

## 2019-01-24 DIAGNOSIS — D8689 Sarcoidosis of other sites: Secondary | ICD-10-CM

## 2019-01-24 MED ORDER — TRAMADOL HCL ER 200 MG PO TB24: 200.0000 mg | ORAL_TABLET | Freq: Every day | ORAL | 2 refills | Status: DC

## 2019-01-24 NOTE — Progress Notes (Signed)
Reason for visit: Neurosarcoidosis  [name] [name] is an 53 y.o. female  History of present illness:  [name] [name] is a 53 year old right-handed black female with a history of neurosarcoidosis.  The patient has intractable migraine headaches, she also has chronic neck pain and more recently has developed pain in the left shoulder.  The patient has pain when she elevates the left arm or rotates the shoulder.  The patient has had left foot surgery done in June 2019, she has had significant pain in the foot and up the left leg since that time, she has not been able to rest well at night because of discomfort.  She is having swelling in the left foot and leg, she is getting the leg wrapped for this reason.  The patient was recently given Decadron for her headaches but this upset her stomach.  In the past she has been on Lyrica, gabapentin, and Aimovig without complete benefit.  She returns to the office today for an evaluation.  Past Medical History:  Diagnosis Date  . Abdominal pain 06/25/2010   CT abd/pelvis 05/2014: Very large amount of stool noted distending the colon. The size consistent with severe constipation/fecal impaction.    . Abnormal cervical Papanicolaou smear 04/17/2017  . Anemia   . Arthritis    lumbar region- radiculopathy  . Cancer [facility]) 2013    Thyroid Ca-thyroidectomy  . Chronic fatigue   . DM (diabetes mellitus) ([place])   . Gait disturbance   . GERD (gastroesophageal reflux disease)   . Headache(784.0)   . History of osteoporosis 05/25/2007   DEXA 2014 Since the baseline examination in 2002, there has been a  statistically significant 19.5% increase in bone mineral density in  the lumbar spine and a statistically significant 14.6% increase in  bone mineral density in the left femur.   DEXA 2002 with osteoporosis of lumbar spine and moderate osteopenia of left hip MRI 2002 left hip negative for avascular necrosis  Qualifier: Diagnosis o  . HTN (hypertension)   .  Hypothyroidism   . Insomnia 01/02/2015  . Intermittent left-sided chest pain 01/26/2013  . Nephrolithiasis   . Obesity   . OSA (obstructive sleep apnea)    last test- many yrs. ago, done at [facility], no CPAP in use  . Osteoporosis   . Peripheral neuropathy   . Prolonged QT interval   . 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 BRAIN SURGERY  2001   for sarcoidosis  . BREAST BIOPSY Left   . BREAST BIOPSY Right   . BREAST EXCISIONAL BIOPSY Right    x2 benign  . BREAST EXCISIONAL BIOPSY Left    benign  . BREAST SURGERY Bilateral    biopsies   . DILATION AND CURETTAGE OF UTERUS    . HYSTEROSCOPY    . LUMBAR LAMINECTOMY/DECOMPRESSION MICRODISCECTOMY Left 08/20/2016   Procedure: Left Lumbar four-five Microdiskectomy;  Surgeon: Leeroy Cha, MD;  Location: [place] [facility];  Service: Neurosurgery;  Laterality: Left;  . PARTIAL HYSTERECTOMY  2013  . ROTATOR CUFF REPAIR Right    07/2013  . Suboccipital craniotomy    . THYROIDECTOMY     Precancerous lesion    Family History  Problem Relation Age of Onset  . Heart disease Mother        questionable CAD and arrythmias   . Cancer Mother        Breast cancer  .  Venous thrombosis Sister   . Diabetes Sister   . Heart disease Sister   . Diabetes Brother   . Heart disease Brother   . Cancer Maternal Aunt   . Venous thrombosis Sister   . Diabetes Sister   . Heart disease Sister     Social history:  reports that she has never smoked. She has never used smokeless tobacco. She reports that she does not drink alcohol or use drugs.   No Known Allergies  Medications:  Prior to Admission medications   Medication Sig Start Date End Date Taking? Authorizing Provider  aspirin 81 MG chewable tablet Chew 81 mg by mouth daily.    [provider]  atorvastatin (LIPITOR) 20 MG tablet TAKE 1 TABLET BY MOUTH EVERY DAY IN THE EVENING 12/28/18   Agyei, Caprice Kluver, MD  folic  acid (FOLVITE) 1 MG tablet TAKE 1 TABLET BY MOUTH EVERY DAY 11/11/18   Agyei, Caprice Kluver, MD  levothyroxine (SYNTHROID, LEVOTHROID) 150 MCG tablet TAKE 1 TABLET (150 MCG TOTAL) BY MOUTH DAILY BEFORE BREAKFAST. 12/28/18   Agyei, Caprice Kluver, MD  losartan (COZAAR) 100 MG tablet Take 1 tablet (100 mg total) by mouth daily. 11/02/18 01/31/19  Jean Rosenthal, MD  metFORMIN (GLUCOPHAGE) 1000 MG tablet TAKE 1 TABLET BY MOUTH TWICE A DAY WITH A MEAL 09/03/18   Agyei, Caprice Kluver, MD  methotrexate (RHEUMATREX) 2.5 MG tablet TAKE 4 TABLETS (10 MG TOTAL) BY MOUTH ONCE A WEEK. CAUTION:CHEMOTHERAPY. PROTECT FROM LIGHT. 11/25/18   Kathrynn Ducking, MD  Multiple Vitamin (MULTIVITAMIN WITH MINERALS) TABS tablet Take 1 tablet by mouth every evening.     [provider]  pantoprazole (PROTONIX) 20 MG tablet TAKE 1 TABLET BY MOUTH EVERY DAY 12/28/18   Jean Rosenthal, MD  traMADol [facility]) 200 MG 24 hr tablet Take 1 tablet (200 mg total) by mouth daily. 01/24/19   Kathrynn Ducking, MD    ROS:  Out of a complete 14 system review of symptoms, the patient complains only of the following symptoms, and all other reviewed systems are negative.  Fatigue Runny nose Blurred vision Excessive thirst Constipation Restless legs, insomnia, daytime sleepiness Back pain Headache  Blood pressure (!) 147/88, pulse 91, height 5\' 8"  (1.727 m), weight 225 lb (102.1 kg), last menstrual period 12/03/2011.  Physical Exam  General: The patient is alert and cooperative at the time of the examination.  The patient is moderately to markedly obese.  Skin: No significant peripheral edema is noted.   Neurologic Exam  Mental status: The patient is alert and oriented x 3 at the time of the examination. The patient has apparent normal recent and remote memory, with an apparently normal attention span and concentration ability.   Cranial nerves: Facial symmetry is present. Speech is normal, no aphasia or dysarthria is noted. Extraocular  movements are full. Visual fields are full.  Motor: The patient has good strength in all 4 extremities.  Sensory examination: Soft touch sensation is symmetric on the face, arms, and legs.  Coordination: The patient has good finger-nose-finger and heel-to-shin bilaterally.  Gait and station: The patient has a normal gait. Tandem gait is slightly unsteady. Romberg is positive. No drift is seen.  Reflexes: Deep tendon reflexes are symmetric.   Assessment/Plan:  1.  Neurosarcoidosis  2.  Migraine headache  3.  Left shoulder pain  4.  Left foot and leg pain, chronic pain syndrome  The patient will be sent to [facility] for evaluation of  her left shoulder pain, she has had right shoulder surgery in the past.  The patient will be sent for a pain center evaluation for her chronic left leg pain.  She has had blood work done on 10 December 2018, she would not have blood work done today.  She will continue the methotrexate for now, it is possible we may be able to reduce the dose and get her off the medication in the future.  The patient will follow-up in 6 months.  A prescription was given for Ultram 200 mg extended release tablets to take 1 daily until she can get into the pain center.  Jill Alexanders MD 01/24/2019 4:39 PM  [facility] [address] [place], [place] [address]  Phone [phone] Fax [phone]

## 2019-01-25 ENCOUNTER — Telehealth: Payer: Self-pay | Admitting: Neurology

## 2019-01-25 ENCOUNTER — Ambulatory Visit: Payer: Medicaid Other | Admitting: Occupational Therapy

## 2019-01-25 DIAGNOSIS — I890 Lymphedema, not elsewhere classified: Secondary | ICD-10-CM

## 2019-01-25 NOTE — Therapy (Signed)
[place] MAIN [facility] SERVICES [address] [place], [place], [address] Phone: [phone]   Fax:  [phone]  Occupational Therapy Treatment  Patient Details  Name: [name] [name] MRN: [id number] Date of Birth: 06/07/1966 Referring Provider (OT): Tyson Dense, [place]   Encounter Date: 01/25/2019  OT End of Session - 01/25/19 1737    Visit Number  3    Number of Visits  36    Date for OT Re-Evaluation  03/23/19    OT Start Time  0100    OT Stop Time  0210    OT Time Calculation (min)  70 min    Activity Tolerance  Patient tolerated treatment well;No increased pain;Patient limited by pain    Behavior During Therapy  [facility] for tasks assessed/performed       Past Medical History:  Diagnosis Date  . Abdominal pain 06/25/2010   CT abd/pelvis 05/2014: Very large amount of stool noted distending the colon. The size consistent with severe constipation/fecal impaction.    . Abnormal cervical Papanicolaou smear 04/17/2017  . Anemia   . Arthritis    lumbar region- radiculopathy  . Cancer [facility]) 2013    Thyroid Ca-thyroidectomy  . Chronic fatigue   . DM (diabetes mellitus) ([name])   . Gait disturbance   . GERD (gastroesophageal reflux disease)   . Headache(784.0)   . History of osteoporosis 05/25/2007   DEXA 2014 Since the baseline examination in 2002, there has been a  statistically significant 19.5% increase in bone mineral density in  the lumbar spine and a statistically significant 14.6% increase in  bone mineral density in the left femur.   DEXA 2002 with osteoporosis of lumbar spine and moderate osteopenia of left hip MRI 2002 left hip negative for avascular necrosis  Qualifier: Diagnosis o  . HTN (hypertension)   . Hypothyroidism   . Insomnia 01/02/2015  . Intermittent left-sided chest pain 01/26/2013  . Nephrolithiasis   . Obesity   . OSA (obstructive sleep apnea)    last test- many yrs. ago, done at [facility], no CPAP in use  . Osteoporosis   .  Peripheral neuropathy   . Prolonged QT interval   . 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 BRAIN SURGERY  2001   for sarcoidosis  . BREAST BIOPSY Left   . BREAST BIOPSY Right   . BREAST EXCISIONAL BIOPSY Right    x2 benign  . BREAST EXCISIONAL BIOPSY Left    benign  . BREAST SURGERY Bilateral    biopsies   . DILATION AND CURETTAGE OF UTERUS    . HYSTEROSCOPY    . LUMBAR LAMINECTOMY/DECOMPRESSION MICRODISCECTOMY Left 08/20/2016   Procedure: Left Lumbar four-five Microdiskectomy;  Surgeon: Leeroy Cha, MD;  Location: [place] [facility];  Service: Neurosurgery;  Laterality: Left;  . PARTIAL HYSTERECTOMY  2013  . ROTATOR CUFF REPAIR Right    07/2013  . Suboccipital craniotomy    . THYROIDECTOMY     Precancerous lesion    There were no vitals filed for this visit.  Subjective Assessment - 01/25/19 1730    Subjective   Pt presents for OT visist 3/36 to address LLE lymphedema. Pt is accompanied by her significant other, [name]. Pt presents without compression in place, and she does not bring short stretch wraps to clinic. Pt denies increased LE pain after MLD last session. "It felt good."    Patient is accompained  by:  Family member    Pertinent History  07/2018 onset LLE swelling and chronic pain s/p EPF and posterior tibial tendon; sarcoidosis w/ brain sx 2001; periferal neuropathy, OSA-no cPAP; obesity; hypothyroidism 2/2 thyroid ca; HTN; DM, chronic fatigue, lumbar region radiculopathy    Limitations  chronic LLE pain and swelling; impaired transfers, impaired standing and walking tolerance, difficulty fitting preferred footwear; limited ability to perform work and productive activities 2/2 leg pain and swelling; difficulty sleeping, limited leisure pursuits and social participation; impaired body image 2/2 LLE swelling    Repetition  Increases Symptoms    Special Tests  + Stemmer sign base of toes bilaerally     Currently in Pain?  Yes    Pain Score  6     Pain Location  Leg    Pain Orientation  Left    Pain Descriptors / Indicators  Aching;Tender;Throbbing;Sore;Pressure;Constant;Discomfort;Heaviness;Tightness;Tingling;Penetrating;Tiring    Pain Type  Chronic pain    Pain Radiating Towards  Sciatic nerve pain; LLE lymphedema related pain and discomfort    Pain Onset  --   onset 07/2018   Pain Frequency  Constant                   OT Treatments/Exercises (OP) - 01/25/19 0001      ADLs   ADL Education Given  Yes      Manual Therapy   Manual Therapy  Edema management;Manual Lymphatic Drainage (MLD)    Manual Lymphatic Drainage (MLD)  MLD to LLE/LLQ using short neck sequence, deep abdominal pathwayd and functional inguinals.Marland Kitchen Applied gentle scar massage and fibrosis techniques to surgical area and medial and lateral malleoli. Pt tolerated without difficulty    Compression Bandaging  no wraps. not available today             OT Education - 01/25/19 1737    Education Details  Continued skilled Pt/caregiver education  And LE ADL training throughout visit for lymphedema self care/ home program, including compression wrapping, compression garment and device wear/care, lymphatic pumping ther ex, simple self-MLD, and skin care. Discussed progress towards goals.     Person(s) Educated  Patient;Other (comment)    Methods  Explanation;Demonstration;Handout    Comprehension  Verbalized understanding;Returned demonstration;Need further instruction          OT Long Term Goals - 12/30/18 1448      OT LONG TERM GOAL #1   Title  Pt will demonstrate understanding of lymphedema (LE) precautions / prevention principals, including signs / symptoms of cellulitis infection with modified independence using  by using LE Workbook as reference to identify 6 precautions without verbal cues by end of 3rd  OT Rx visit.     Baseline  max A    Time  3    Period  Days    Status  New    Target Date   --   3rd OT Rx visit     OT LONG TERM GOAL #2   Title  Pt will be able to apply LLE, knee length, multi-layer, short stretch compression wraps using correct gradient techniques with min CG assist to achieve optimal limb volume reduction, and to return affected limb , as closely as possible, to premorbid size and shape.    Baseline  dependent    Time  2    Period  Weeks    Status  New    Target Date  --   4th OT Rx visit     OT LONG TERM GOAL #3  Title  Pt to achieve no less than 5%  limb volume reduction in affected LLE during Intensive Phase CDT to improve functional ambulation and mobility,  to improve tissue integrity and reduce infection risk.    Baseline  Max A    Time  12    Period  Weeks    Status  New    Target Date  03/23/19      OT LONG TERM GOAL #4   Title  Pt will achieve and sustain no less than 85% compliance with all daily LE self-care home program components throughout Intensive Phase CDT, including daily  impeccable skin care, daily simple self-MLD, daily  gradient compression wrapping, and therapeutic exercise to ensure optimal limb volume reduction and swelling control, infection risk reduction, and  to expedite compression garment/ device fitting to improve functional mobility and ambulation.    Baseline  Max A    Time  12    Period  Weeks    Status  New    Target Date  03/23/19      OT LONG TERM GOAL #5   Title  Pt will be able to don and doff appropriate compression garments with modified independence ( extra time) on issue date for optimal independence with daily compression component of LE self care.    Baseline  Max A    Time  12    Period  Weeks    Target Date  03/23/19      Long Term Additional Goals   Additional Long Term Goals  Yes      OT LONG TERM GOAL #6   Title  During self-management phase of CDT Pt will retain limb volume reductions achieved during Intensive Phase CDT with no more than 2% volume increase with ongoing CG assistance to limit LE  progression and further functional decline.    Baseline  Max A    Time  6    Period  Months    Status  New    Target Date  06/28/19            Plan - 01/25/19 1737    Clinical Impression Statement  Pt tolerated MLD today without difficulty. She reports brief anagesic effect after MLD last session. Swelling and tissue density are unchanged at L leg, ankle and foot today. Pt did not utilized compression during visit interval. She reports she tolerated ankle wrap after last session  for ~8 hours. She removed wraps before going to bed when pain typically increases.    Occupational Profile and client history currently impacting functional performance  chronic fatigue    Occupational performance deficits (Please refer to evaluation for details):  ADL's;Work;IADL's;Rest and Sleep;Leisure;Social Participation;Other   body image   Rehab Potential  Good    Current Impairments/barriers affecting progress:  decreased coping skills; good family support    OT Frequency  2x / week    OT Duration  12 weeks    OT Treatment/Interventions  Self-care/ADL training;Therapeutic exercise;Energy conservation;Functional Mobility Training;Manual lymph drainage;Therapeutic activities;Coping strategies training;Patient/family education;Compression bandaging;Manual Therapy;DME and/or AE instruction    Clinical Decision Making  Several treatment options, min-mod task modification necessary    Recommended Other Services  fit w/ appropriate compression garments. Consider OTS Juzo 2001 or 2002 ( 20-30, or  30-40 mmHg) or compression anklet    Consulted and Agree with Plan of Care  Patient;Family member/caregiver       Patient will benefit from skilled therapeutic intervention in order to improve the following deficits  and impairments:  Decreased knowledge of use of DME, Decreased skin integrity, Increased edema, Impaired sensation, Impaired perceived functional ability, Decreased mobility, Decreased knowledge of  precautions, Obesity, Decreased coping skills, Pain, Difficulty walking  Visit Diagnosis: Lymphedema, not elsewhere classified    Problem List Patient Active Problem List   Diagnosis Date Noted  . Left sided numbness 12/10/2018  . Pain and swelling of lower leg, left 12/16/2017  . Obesity 11/24/2016  . Herniated lumbar disc without myelopathy 08/20/2016  . Healthcare maintenance 02/18/2016  . Insomnia 01/02/2015  . Intractable migraine without aura 02/09/2013  . Hypothyroidism 05/08/2011  . FIBROIDS, UTERUS 11/06/2010  . HYPERLIPIDEMIA 01/28/2010  . Sciatica of left side associated with disorder of lumbar spine 10/20/2007  . 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 Diabetes mellitus type 2, controlled ([place]) 05/25/2007  . OBSTRUCTIVE SLEEP APNEA 05/25/2007  . Essential hypertension 05/25/2007  . Gastroesophageal reflux disease 05/25/2007  . LONG QT SYNDROME 05/25/2007    Andrey Spearman, MS, OTR/L, [facility] 01/25/19 5:40 PM  [place] MAIN [facility] SERVICES [address] [place], [place], [address] Phone: [phone]   Fax:  [phone]  Name: [name] [name] MRN: [id number] Date of Birth: 01/30/1966

## 2019-01-25 NOTE — Patient Instructions (Signed)

## 2019-01-25 NOTE — Telephone Encounter (Signed)
I sent the referral to [facility] but they contacted me and informed me per [place], [facility] internal Medicine is listed on her medicaid card. They will need to be the office that refers her to their practice.

## 2019-01-25 NOTE — Telephone Encounter (Signed)
I called the patient.  The patient will require an orthopedic referral through her primary care physician, it cannot come through this office.  I discussed this with her.

## 2019-01-28 ENCOUNTER — Ambulatory Visit (INDEPENDENT_AMBULATORY_CARE_PROVIDER_SITE_OTHER): Payer: Medicaid Other | Admitting: Internal Medicine

## 2019-01-28 ENCOUNTER — Other Ambulatory Visit: Payer: Self-pay

## 2019-01-28 VITALS — BP 129/77 | HR 94 | Temp 98.3°F | Ht 68.0 in | Wt 228.3 lb

## 2019-01-28 DIAGNOSIS — M25512 Pain in left shoulder: Secondary | ICD-10-CM | POA: Diagnosis not present

## 2019-01-28 DIAGNOSIS — Z79890 Hormone replacement therapy (postmenopausal): Secondary | ICD-10-CM | POA: Diagnosis not present

## 2019-01-28 DIAGNOSIS — Z791 Long term (current) use of non-steroidal anti-inflammatories (NSAID): Secondary | ICD-10-CM

## 2019-01-28 DIAGNOSIS — Z79899 Other long term (current) drug therapy: Secondary | ICD-10-CM

## 2019-01-28 DIAGNOSIS — E890 Postprocedural hypothyroidism: Secondary | ICD-10-CM | POA: Diagnosis not present

## 2019-01-28 DIAGNOSIS — Z8669 Personal history of other diseases of the nervous system and sense organs: Secondary | ICD-10-CM

## 2019-01-28 DIAGNOSIS — Z85850 Personal history of malignant neoplasm of thyroid: Secondary | ICD-10-CM | POA: Diagnosis not present

## 2019-01-28 DIAGNOSIS — I10 Essential (primary) hypertension: Secondary | ICD-10-CM | POA: Diagnosis not present

## 2019-01-28 DIAGNOSIS — G8929 Other chronic pain: Secondary | ICD-10-CM

## 2019-01-28 DIAGNOSIS — G629 Polyneuropathy, unspecified: Secondary | ICD-10-CM

## 2019-01-28 MED ORDER — IBUPROFEN 600 MG PO TABS: 600.0000 mg | ORAL_TABLET | Freq: Four times a day (QID) | ORAL | 0 refills | Status: DC | PRN

## 2019-01-28 MED ORDER — LEVOTHYROXINE SODIUM 150 MCG PO TABS: 150.0000 ug | ORAL_TABLET | Freq: Every day | ORAL | 0 refills | Status: DC

## 2019-01-28 NOTE — Patient Instructions (Signed)
[name],   I refilled your thyroid medicine. We will check your thyroid to make sure you are on the right dose of this medicine.  Blood pressure looks excellent when we rechecked it.  Continue taking losartan as usual.  You do not need amlodipine for this.  We will make a referral to the pain clinic.  Please make sure to follow-up with your orthopedic surgeon as discussed.  Please make a follow-up appointment with your regular doctor in 4 weeks.  Call us if you have any questions or concerns.  - Dr. Frederico Hamman

## 2019-01-29 LAB — BMP8+ANION GAP
Anion Gap: 17 mmol/L (ref 10.0–18.0)
BUN / CREAT RATIO: 13 (ref 9–23)
BUN: 10 mg/dL (ref 6–24)
CO2: 20 mmol/L (ref 20–29)
Calcium: 9.5 mg/dL (ref 8.7–10.2)
Chloride: 102 mmol/L (ref 96–106)
Creatinine, Ser: 0.77 mg/dL (ref 0.57–1.00)
GFR calc Af Amer: 103 mL/min/{1.73_m2} (ref >59)
GFR calc non Af Amer: 90 mL/min/{1.73_m2} (ref >59)
Glucose: 267 mg/dL — ABNORMAL HIGH (ref 65–99)
Potassium: 4.5 mmol/L (ref 3.5–5.2)
Sodium: 139 mmol/L (ref 134–144)

## 2019-01-29 LAB — TSH: TSH: 1.69 u[IU]/mL (ref 0.450–4.500)

## 2019-01-30 ENCOUNTER — Encounter: Payer: Self-pay | Admitting: Internal Medicine

## 2019-01-30 DIAGNOSIS — G8929 Other chronic pain: Secondary | ICD-10-CM | POA: Insufficient documentation

## 2019-01-30 DIAGNOSIS — M25512 Pain in left shoulder: Secondary | ICD-10-CM

## 2019-01-30 NOTE — Assessment & Plan Note (Signed)
[name] [name] is currently on losartan 100 mg QD for HTN and BP is well controlled. She used to be on amlodipine and asks if she needs to restart this. We discussed her BP is under excellent control at this time and there is no indication to add a second anti-hypertensive to her regimen. Advised her to monitor her BP at home and call us if she noticed persistent high BP > 140/90.

## 2019-01-30 NOTE — Assessment & Plan Note (Addendum)
[name] [name] presents with a 17-month history of L shoulder pain associated with neuropathy that she describes as pin and needles. She describes her pain as constant with flares throughout the day. She has been taking 7 200mg  tablets of ibuprofen several times a day for the pain. She was evaluated for this in [facility] on 12/2017 and an MRI brain was recommended due to her history of neurosarcoidosis. MRI was unremarkable. Since then she has followed up with neurology on 2/24 who recommended she be evaluated by orthopedic surgery as they believe her symptoms as MSK in nature as she has had surgery on the R shoulder for similar complaints. On exam, she complains of L Shoulder pain but has full ROM and no gross abnormalities were noted. Will refer to ortho for further evaluation. BMP to check renal function due to recent use of high dose NSAIDs.

## 2019-01-30 NOTE — Progress Notes (Signed)
   CC: Chronic L shoulder pain and hypothyroidism follow up  HPI:  [name] is a 53 y.o. year-old female with PMH listed below who presents to clinic for chronic L shoulder pain and hypothyroidism follow up. Please see problem based assessment and plan for further details.   Past Medical History:  Diagnosis Date  . Abdominal pain 06/25/2010   CT abd/pelvis 05/2014: Very large amount of stool noted distending the colon. The size consistent with severe constipation/fecal impaction.    . Abnormal cervical Papanicolaou smear 04/17/2017  . Anemia   . Arthritis    lumbar region- radiculopathy  . Cancer [facility]) 2013    Thyroid Ca-thyroidectomy  . Chronic fatigue   . DM (diabetes mellitus) ([place])   . Gait disturbance   . GERD (gastroesophageal reflux disease)   . Headache(784.0)   . History of osteoporosis 05/25/2007   DEXA 2014 Since the baseline examination in 2002, there has been a  statistically significant 19.5% increase in bone mineral density in  the lumbar spine and a statistically significant 14.6% increase in  bone mineral density in the left femur.   DEXA 2002 with osteoporosis of lumbar spine and moderate osteopenia of left hip MRI 2002 left hip negative for avascular necrosis  Qualifier: Diagnosis o  . HTN (hypertension)   . Hypothyroidism   . Insomnia 01/02/2015  . Intermittent left-sided chest pain 01/26/2013  . Nephrolithiasis   . Obesity   . OSA (obstructive sleep apnea)    last test- many yrs. ago, done at [facility], no CPAP in use  . Osteoporosis   . Peripheral neuropathy   . Prolonged QT interval   . 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  Gastrointestinal: Negative for abdominal pain and constipation.  Musculoskeletal: Positive for joint pain and myalgias.  Neurological: Negative for dizziness and headaches.    Physical Exam:  Vitals:   01/28/19 1103 01/28/19 1129  BP: (!) 143/72 129/77  Pulse: 91 94  Temp: 98.3 F (36.8 C)   TempSrc: Oral   SpO2: 100% 98%  Weight: 228 lb 4.8 oz (103.6 kg)   Height: 5\' 8"  (1.727 m)    General: well-appearing female in NAD  Cardiac: RRR, no mrg  MSK: L shoulder appears grossly normal without erythema or warmth, there is full range of motion on both passive and active motion of joint   Assessment & Plan:   See Encounters Tab for problem based charting.  Patient discussed with Dr. Daryll Drown

## 2019-01-30 NOTE — Assessment & Plan Note (Signed)
Patient s/p thyroid resection 2/2 malignancy in 01/2011. She is on Synthroid 150 mcg and compliant. Currently asymptomatic. Refilled Synthroid and ordered TSH. Last TSH was normal in 2018.

## 2019-01-31 ENCOUNTER — Telehealth: Payer: Self-pay

## 2019-01-31 NOTE — Progress Notes (Signed)
Internal Medicine Clinic Attending  Case discussed with Dr. Santos-Sanchez at the time of the visit.  We reviewed the resident's history and exam and pertinent patient test results.  I agree with the assessment, diagnosis, and plan of care documented in the resident's note.    

## 2019-01-31 NOTE — Telephone Encounter (Signed)
PA for tramadol 200 mg ER has been approved via  tracts. [facility] notified.

## 2019-02-02 ENCOUNTER — Telehealth: Payer: Self-pay

## 2019-02-02 DIAGNOSIS — M79605 Pain in left leg: Secondary | ICD-10-CM

## 2019-02-02 NOTE — Telephone Encounter (Signed)
FYI Dr. Vira Blanco at [facility] has denied referral placed.

## 2019-02-02 NOTE — Telephone Encounter (Signed)
The patient will be referred to [facility].

## 2019-02-02 NOTE — Addendum Note (Signed)
Addended by: Kathrynn Ducking on: 02/02/2019 12:17 PM   Modules accepted: Orders

## 2019-02-22 ENCOUNTER — Encounter: Payer: Medicaid Other | Admitting: Occupational Therapy

## 2019-02-23 ENCOUNTER — Ambulatory Visit: Payer: Medicaid Other

## 2019-02-23 ENCOUNTER — Other Ambulatory Visit: Payer: Self-pay | Admitting: Internal Medicine

## 2019-02-24 ENCOUNTER — Encounter: Payer: Medicaid Other | Admitting: Occupational Therapy

## 2019-02-28 ENCOUNTER — Other Ambulatory Visit: Payer: Self-pay | Admitting: Internal Medicine

## 2019-02-28 DIAGNOSIS — M25512 Pain in left shoulder: Secondary | ICD-10-CM

## 2019-03-01 ENCOUNTER — Encounter: Payer: Self-pay | Admitting: *Deleted

## 2019-03-01 ENCOUNTER — Encounter: Payer: Medicaid Other | Admitting: Occupational Therapy

## 2019-03-01 NOTE — Telephone Encounter (Signed)
Left message for pt to call to discuss 03-14-19 appointment

## 2019-03-03 ENCOUNTER — Encounter: Payer: Medicaid Other | Admitting: Occupational Therapy

## 2019-03-03 NOTE — Telephone Encounter (Signed)
This encounter was created in error - please disregard.

## 2019-03-07 ENCOUNTER — Telehealth: Payer: Self-pay | Admitting: Cardiology

## 2019-03-07 NOTE — Telephone Encounter (Signed)
Left message for patient to call.  She needs to be pre-registered for her virtual visit next week.

## 2019-03-08 ENCOUNTER — Encounter: Payer: Medicaid Other | Admitting: Occupational Therapy

## 2019-03-09 ENCOUNTER — Encounter: Payer: Medicaid Other | Admitting: Occupational Therapy

## 2019-03-10 ENCOUNTER — Encounter: Payer: Medicaid Other | Admitting: Occupational Therapy

## 2019-03-10 NOTE — Telephone Encounter (Signed)
Follow Up ° ° °Pt is returning phone call  ° °Please call back  °

## 2019-03-11 ENCOUNTER — Telehealth: Payer: Self-pay | Admitting: Cardiology

## 2019-03-11 NOTE — Telephone Encounter (Signed)
Smartphone/emailed link to mychart/pre reg complete/04.20.2020

## 2019-03-14 ENCOUNTER — Other Ambulatory Visit: Payer: Self-pay | Admitting: Internal Medicine

## 2019-03-14 ENCOUNTER — Telehealth (INDEPENDENT_AMBULATORY_CARE_PROVIDER_SITE_OTHER): Payer: Medicaid Other | Admitting: Cardiology

## 2019-03-14 VITALS — BP 154/80 | HR 81 | Ht 68.0 in | Wt 208.0 lb

## 2019-03-14 DIAGNOSIS — I420 Dilated cardiomyopathy: Secondary | ICD-10-CM

## 2019-03-14 DIAGNOSIS — I10 Essential (primary) hypertension: Secondary | ICD-10-CM

## 2019-03-14 DIAGNOSIS — R072 Precordial pain: Secondary | ICD-10-CM

## 2019-03-14 MED ORDER — CARVEDILOL 6.25 MG PO TABS: 6.2500 mg | ORAL_TABLET | Freq: Two times a day (BID) | ORAL | 3 refills | Status: DC

## 2019-03-14 NOTE — Patient Instructions (Signed)
Medication Instructions:  START CARVEDILOL 6.25 MG ONE TABLET TWICE DAILY If you need a refill on your cardiac medications before your next appointment, please call your pharmacy.   Lab work: If you have labs (blood work) drawn today and your tests are completely normal, you will receive your results only by: Marland Kitchen MyChart Message (if you have MyChart) OR . A paper copy in the mail If you have any lab test that is abnormal or we need to change your treatment, we will call you to review the results.  Follow-Up: At [facility], you and your health needs are our priority.  As part of our continuing mission to provide you with exceptional heart care, we have created designated Provider Care Teams.  These Care Teams include your primary Cardiologist (physician) and Advanced Practice Providers (APPs -  Physician Assistants and Nurse Practitioners) who all work together to provide you with the care you need, when you need it. You will need a follow up appointment in 6 months.  Please call our office 2 months in advance to schedule this appointment.  You may see Kirk Ruths MD or one of the following Advanced Practice Providers on your designated Care Team:   Kerin Ransom, PA-C Roby Lofts, [place] . Sande Rives, PA-C

## 2019-03-14 NOTE — Progress Notes (Signed)
Telehealth visit  Evaluation Performed:  Follow-up visit  Date:  03/14/2019   ID:  [name] [name], DOB October 25, 1966, MRN [id number]  Pt location: home  Provider location: office  PCP:  [name] Rosenthal, MD  Cardiologist:  Dr Stanford Breed  Chief Complaint:  FU Chest pain  History of Present Illness:    [name] [name] is a 53 y.o. female who presents via audio/video conferencing for a telehealth visit today.    FU CP; also with h/o sarcoidosis, diabetes, and previously diagnosed long QT. Echocardiogram in July 2005 showed normal LV function. Nuclear study 5/12 showed EF 49% with a small apical defect most likely from soft tissue attenuation. Small prior infarct could not be excluded. Stress echocardiogram April 2014 normal. Nuclear study January 2020 showed ejection fraction 43% and no ischemia or infarction.  Echocardiogram January 2020 showed ejection fraction 45 to 79% and mild diastolic dysfunction.  Since last seen she occasionally has mild dyspnea that she attributes to anxiety.  However she denies chest pain, palpitations or syncope.  The patient does not have symptoms concerning for COVID-19 infection (fever, chills, cough, or new shortness of breath).    Past Medical History:  Diagnosis Date  . Abdominal pain 06/25/2010   CT abd/pelvis 05/2014: Very large amount of stool noted distending the colon. The size consistent with severe constipation/fecal impaction.    . Abnormal cervical Papanicolaou smear 04/17/2017  . Anemia   . Arthritis    lumbar region- radiculopathy  . Cancer [facility]) 2013    Thyroid Ca-thyroidectomy  . Chronic fatigue   . DM (diabetes mellitus) ([place])   . Gait disturbance   . GERD (gastroesophageal reflux disease)   . Headache(784.0)   . History of osteoporosis 05/25/2007   DEXA 2014 Since the baseline examination in 2002, there has been a  statistically significant 19.5% increase in bone mineral density in  the lumbar spine and a statistically significant 14.6%  increase in  bone mineral density in the left femur.   DEXA 2002 with osteoporosis of lumbar spine and moderate osteopenia of left hip MRI 2002 left hip negative for avascular necrosis  Qualifier: Diagnosis o  . HTN (hypertension)   . Hypothyroidism   . Insomnia 01/02/2015  . Intermittent left-sided chest pain 01/26/2013  . Nephrolithiasis   . Obesity   . OSA (obstructive sleep apnea)    last test- many yrs. ago, done at [facility], no CPAP in use  . Osteoporosis   . Peripheral neuropathy   . Prolonged QT interval   . 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 BRAIN SURGERY  2001   for sarcoidosis  . BREAST BIOPSY Left   . BREAST BIOPSY Right   . BREAST EXCISIONAL BIOPSY Right    x2 benign  . BREAST EXCISIONAL BIOPSY Left    benign  . BREAST SURGERY Bilateral    biopsies   . DILATION AND CURETTAGE OF UTERUS    . HYSTEROSCOPY    . LUMBAR LAMINECTOMY/DECOMPRESSION MICRODISCECTOMY Left 08/20/2016   Procedure: Left Lumbar four-five Microdiskectomy;  Surgeon: Leeroy Cha, MD;  Location: [place] [facility];  Service: Neurosurgery;  Laterality: Left;  . PARTIAL HYSTERECTOMY  2013  . ROTATOR CUFF REPAIR Right    07/2013  . Suboccipital craniotomy    . THYROIDECTOMY     Precancerous lesion     Current Meds  Medication Sig  . aspirin 81 MG chewable tablet Chew 81  mg by mouth daily.  Marland Kitchen atorvastatin (LIPITOR) 20 MG tablet TAKE 1 TABLET BY MOUTH EVERY DAY IN THE EVENING  . folic acid (FOLVITE) 1 MG tablet TAKE 1 TABLET BY MOUTH EVERY DAY  . ibuprofen (ADVIL,MOTRIN) 600 MG tablet Take 1 tablet (600 mg total) by mouth every 6 (six) hours as needed.  Marland Kitchen levothyroxine (SYNTHROID, LEVOTHROID) 150 MCG tablet Take 1 tablet (150 mcg total) by mouth daily before breakfast.  . metFORMIN (GLUCOPHAGE) 1000 MG tablet TAKE 1 TABLET BY MOUTH TWICE A DAY WITH A MEAL  . methotrexate (RHEUMATREX) 2.5 MG tablet TAKE 4 TABLETS (10 MG TOTAL) BY  MOUTH ONCE A WEEK. CAUTION:CHEMOTHERAPY. PROTECT FROM LIGHT.  . Multiple Vitamin (MULTIVITAMIN WITH MINERALS) TABS tablet Take 1 tablet by mouth every evening.   Marland Kitchen oxyCODONE-acetaminophen (PERCOCET) 10-325 MG tablet Take 1 tablet by mouth every 4 (four) hours as needed for pain.  . pantoprazole (PROTONIX) 20 MG tablet TAKE 1 TABLET BY MOUTH EVERY DAY  . [DISCONTINUED] losartan (COZAAR) 100 MG tablet Take 1 tablet (100 mg total) by mouth daily.  . [DISCONTINUED] traMADol (ULTRAM ER) 200 MG 24 hr tablet Take 1 tablet (200 mg total) by mouth daily.     Allergies:   Patient has no known allergies.   Social History   Tobacco Use  . Smoking status: Never Smoker  . Smokeless tobacco: Never Used  Substance Use Topics  . Alcohol use: No    Alcohol/week: 0.0 standard drinks  . Drug use: No     Family Hx: The patient's family history includes Cancer in her maternal aunt and mother; Diabetes in her brother, sister, and sister; Heart disease in her brother, mother, sister, and sister; Venous thrombosis in her sister and sister.  ROS:   Please see the history of present illness.    Patient denies fevers, chills, productive cough or hemoptysis. All other systems reviewed and are negative.   Labs/Other Tests and Data Reviewed:     Recent Labs: 12/10/2018: ALT 21; Hemoglobin 11.6; Platelets 340 01/28/2019: BUN 10; Creatinine, Ser 0.77; Potassium 4.5; Sodium 139; TSH 1.690   Recent Lipid Panel Lab Results  Component Value Date/Time   CHOL 124 12/06/2014 03:52 PM   TRIG 68 12/06/2014 03:52 PM   HDL 53 12/06/2014 03:52 PM   CHOLHDL 2.3 12/06/2014 03:52 PM   LDLCALC 57 12/06/2014 03:52 PM    Wt Readings from Last 3 Encounters:  03/14/19 208 lb (94.3 kg)  01/28/19 228 lb 4.8 oz (103.6 kg)  01/24/19 225 lb (102.1 kg)     Objective:    Vital Signs:  BP (!) 154/80   Pulse 81   Ht 5\' 8"  (1.727 m)   Wt 208 lb (94.3 kg)   LMP 12/03/2011   BMI 31.63 kg/m    Well nourished, well  developed female in no acute distress.  Remainder of exam not performed due to ongoing Covid pandemic/telehealth visit.   ASSESSMENT & PLAN:    1. Cardiomyopathy-etiology unclear.  LV function mildly reduced.  Nuclear study not suggestive of coronary disease.  Recent TSH normal.  No history of alcohol abuse.  Question contribution from sarcoid.  Will consider cardiac MRI and possible cardiac CTA (to RO CAD) if does not improve.  Continue ARB.  Add carvedilol 6.25 mg twice daily.  Plan follow-up echocardiogram 3 months after medications fully titrated. 2. Chest pain-no further symptoms.  Nuclear study showed no ischemia.  No further evaluation at this point. 3. Hypertension-patient's blood pressure is elevated.  Continue ARB.  Add carvedilol 6.25 mg twice daily.  Follow blood pressure and adjust regimen as needed. 4. History of long QT-no history of syncope.  Not evident on most recent ECG. 5. Hyperlipidemia-continue statin.  COVID-19 Education: The importance of social distancing was discussed today.  Time:   Today, I have spent 25 minutes with the patient with telehealth technology discussing the above problems.     Medication Adjustments/Labs and Tests Ordered: Current medicines are reviewed at length with the patient today.  Concerns regarding medicines are outlined above.  Tests Ordered: No orders of the defined types were placed in this encounter.   Medication Changes: No orders of the defined types were placed in this encounter.   Disposition:  Follow up 6 months  Signed, Kirk Ruths, MD  03/14/2019 3:34 PM    [place]

## 2019-03-15 ENCOUNTER — Encounter: Payer: Medicaid Other | Admitting: Occupational Therapy

## 2019-03-17 ENCOUNTER — Encounter: Payer: Medicaid Other | Admitting: Occupational Therapy

## 2019-03-22 ENCOUNTER — Encounter: Payer: Medicaid Other | Admitting: Occupational Therapy

## 2019-03-22 ENCOUNTER — Telehealth: Payer: Self-pay

## 2019-03-22 ENCOUNTER — Other Ambulatory Visit: Payer: Self-pay | Admitting: Internal Medicine

## 2019-03-22 DIAGNOSIS — I10 Essential (primary) hypertension: Secondary | ICD-10-CM

## 2019-03-22 MED ORDER — LISINOPRIL 20 MG PO TABS: 20.0000 mg | ORAL_TABLET | Freq: Every day | ORAL | 3 refills | Status: DC

## 2019-03-22 NOTE — Progress Notes (Signed)
Received a message from nursing staff that Losartan was on back order.   I did a review online and it said Lisinopril has been on back order since March 9th. I will Discontinue it and write a prescription for Lisinopril 20mg  (equivalent conversion). Her last lab work was February 2020 and she had a normal renal function.   She has no known allergies to ACE inhibitors

## 2019-03-22 NOTE — Telephone Encounter (Signed)
Noted Thanks DrG 

## 2019-03-22 NOTE — Telephone Encounter (Signed)
Hi Hme,   Thanks for the update. I did a review online and it said Lisinopril has been on back order since March 9th. I will Discontinue it and write a prescription for Lisinopril 20mg  (equivalent conversion). Her last lab work was February 2020 and she had a normal renal function.

## 2019-03-22 NOTE — Telephone Encounter (Signed)
Pt states losartan (COZAAR) 100 MG tablet is in back order, requesting to get a different bp med. Please call pt back.

## 2019-03-24 ENCOUNTER — Encounter: Payer: Medicaid Other | Admitting: Occupational Therapy

## 2019-03-29 ENCOUNTER — Encounter: Payer: Medicaid Other | Admitting: Occupational Therapy

## 2019-03-29 ENCOUNTER — Telehealth: Payer: Self-pay | Admitting: Cardiology

## 2019-03-29 ENCOUNTER — Other Ambulatory Visit: Payer: Self-pay | Admitting: Internal Medicine

## 2019-03-29 DIAGNOSIS — Z1231 Encounter for screening mammogram for malignant neoplasm of breast: Secondary | ICD-10-CM

## 2019-03-29 NOTE — Telephone Encounter (Signed)
Spoke to patient . She thinks carvedilol is to strong  snhe states had chest pains   Radiation g top of chest towards the middle  " funny feeling "  Sometimes  Has cold feeling. Does not hurt  If she touch the area.  patient decrease medication to 1/2 tablet OF 6.25 MG twice a day for the last 4-5 days.  Patient has not checked blood pressure since . Patient checked blood pressure while on the phone  123/74 hr 87.  Patient aware will  Defer to Dr Stanford Breed and contact back once information is gvien

## 2019-03-29 NOTE — Telephone Encounter (Signed)
Patient feels that the medication carvedilol (COREG) 6.25 MG tablet is making her chest hurt, she states she wasn't having these symptoms before.  At first she was having some swelling at her hands and feet, then the swelling went down, but the chest pain continue.

## 2019-03-30 ENCOUNTER — Other Ambulatory Visit: Payer: Self-pay | Admitting: Internal Medicine

## 2019-03-30 MED ORDER — CARVEDILOL 3.125 MG PO TABS: 3.1250 mg | ORAL_TABLET | Freq: Two times a day (BID) | ORAL | 3 refills | Status: DC

## 2019-03-30 NOTE — Telephone Encounter (Signed)
Pt verbalized understanding to stay on lower dose of Coreg will call next week if no improvement but reports she is already feeling better.

## 2019-03-30 NOTE — Telephone Encounter (Signed)
Continue coreg 3.125 BID Kirk Ruths

## 2019-03-31 ENCOUNTER — Encounter: Payer: Medicaid Other | Admitting: Occupational Therapy

## 2019-04-04 ENCOUNTER — Telehealth: Payer: Self-pay | Admitting: Internal Medicine

## 2019-04-04 NOTE — Telephone Encounter (Signed)
Returned call to patient. She had questions about new BP med (lisinopril). Per PCP's note on 03/22/2019, losartan on backorder sine 02/07/2019 so it was d/c and patient to begin lisinopril 20 mg daily. Pharmacy gave patient lisinopril 10 mg with instructions to take 2 tabs (20 mgh total) once daily. Reinforced this was correct. Patient appreciative. Hubbard Hartshorn, RN, BSN

## 2019-04-04 NOTE — Telephone Encounter (Signed)
Pls contact pt regarding medicine [phone]

## 2019-04-05 ENCOUNTER — Encounter: Payer: Medicaid Other | Admitting: Occupational Therapy

## 2019-04-07 ENCOUNTER — Encounter: Payer: Medicaid Other | Admitting: Occupational Therapy

## 2019-04-12 ENCOUNTER — Other Ambulatory Visit: Payer: Self-pay | Admitting: Internal Medicine

## 2019-04-12 DIAGNOSIS — E119 Type 2 diabetes mellitus without complications: Secondary | ICD-10-CM

## 2019-04-14 ENCOUNTER — Other Ambulatory Visit: Payer: Self-pay | Admitting: Oncology

## 2019-04-14 DIAGNOSIS — I10 Essential (primary) hypertension: Secondary | ICD-10-CM

## 2019-04-24 ENCOUNTER — Other Ambulatory Visit: Payer: Self-pay | Admitting: Internal Medicine

## 2019-04-24 DIAGNOSIS — E890 Postprocedural hypothyroidism: Secondary | ICD-10-CM

## 2019-05-18 ENCOUNTER — Other Ambulatory Visit: Payer: Self-pay | Admitting: Neurology

## 2019-05-24 ENCOUNTER — Encounter: Payer: Self-pay | Admitting: Internal Medicine

## 2019-05-24 ENCOUNTER — Ambulatory Visit: Payer: Medicaid Other | Admitting: Internal Medicine

## 2019-05-24 ENCOUNTER — Other Ambulatory Visit: Payer: Self-pay

## 2019-05-24 ENCOUNTER — Ambulatory Visit: Payer: Medicaid Other

## 2019-05-24 VITALS — BP 128/70 | HR 100 | Temp 98.4°F | Ht 68.0 in | Wt 219.0 lb

## 2019-05-24 DIAGNOSIS — M7918 Myalgia, other site: Secondary | ICD-10-CM | POA: Diagnosis not present

## 2019-05-24 DIAGNOSIS — Z85850 Personal history of malignant neoplasm of thyroid: Secondary | ICD-10-CM

## 2019-05-24 DIAGNOSIS — M79672 Pain in left foot: Secondary | ICD-10-CM

## 2019-05-24 MED ORDER — CYCLOBENZAPRINE HCL 10 MG PO TABS: 10.0000 mg | ORAL_TABLET | Freq: Three times a day (TID) | ORAL | 0 refills | Status: AC | PRN

## 2019-05-24 NOTE — Assessment & Plan Note (Signed)
Patient presents with approximately 12 month history of progressive pain in her gluteal region with associated knot that she feels with deep palpation that she notes has progressed in size. On extensive initial exam, I was unable to palpate anything. On repeat exam, I was able to palpate a small area of density in her piriformis region.  Given her symptoms that radiate down her leg, this may be piriformis syndrome. Will treat with short course of Flexeril to treat muscle spasm. She was provided home exercises and stretching as well. Encouraged scheduled anti-inflammatories for the next 2 weeks.  Given her significant concern and request for imaging, feel it is reasonable to pursue soft tissue ultrasound to rule out any underlying soft tissue abnormality.

## 2019-05-24 NOTE — Patient Instructions (Addendum)
[name] [name],  It was as pleasure meeting you. I am sorry your hip has been giving you such a difficult time. We will treat it as something called Piriformis syndrome. I am giving you a medication to help loosen the muscles which can particularly help at night time. I also want you to take your preference of anti-inflammatory (Advil, Ibuprofen, Aleve) for the next 2 weeks.   If you do not notice any improvement and can still palpate a knot, we will likely pursue ultrasound at that time. You are due to see your primary care doctor in the next 4-6 weeks which will be a good time to check in to see how your hip is doing.   I have attached some exercises and stretches I'd also like you to do at home.   I have placed a referral for your foot pain as well.   Take care! Dr. Koleen Distance  Piriformis Syndrome Rehab Ask your health care provider which exercises are safe for you. Do exercises exactly as told by your health care provider and adjust them as directed. It is normal to feel mild stretching, pulling, tightness, or discomfort as you do these exercises, but you should stop right away if you feel sudden pain or your pain gets worse.Do not begin these exercises until told by your health care provider. Stretching and range of motion exercises These exercises warm up your muscles and joints and improve the movement and flexibility of your hip and pelvis. These exercises also help to relieve pain, numbness, and tingling. Exercise A: Hip rotators  1. Lie on your back on a firm surface. 2. Pull your left / right knee toward your same shoulder with your left / right hand until your knee is pointing toward the ceiling. Hold your left / right ankle with your other hand. 3. Keeping your knee steady, gently pull your left / right ankle toward your other shoulder until you feel a stretch in your buttocks. 4. Hold this position for 60 seconds. Repeat 3 times. Complete this stretch 3 times a day. Exercise B: Hip  extensors 1. Lie on your back on a firm surface. Both of your legs should be straight. 2. Pull your left / right knee to your chest. Hold your leg in this position by holding onto the back of your thigh or the front of your knee. 3. Hold this position for 60 seconds. 4. Slowly return to the starting position. Repeat 3 times. Complete this stretch 3 times a day. Strengthening exercises These exercises build strength and endurance in your hip and thigh muscles. Endurance is the ability to use your muscles for a long time, even after they get tired. Exercise C: Straight leg raises (hip abductors)  1. Lie on your side with your left / right leg in the top position. Lie so your head, shoulder, knee, and hip line up. Bend your bottom knee to help you balance. 2. Lift your top leg up 4-6 inches (10-15 cm), keeping your toes pointed straight ahead. 3. Hold this position for 30 seconds. 4. Slowly lower your leg to the starting position. Let your muscles relax completely. Repeat 3 times. Complete this exercise 3 times a day. Exercise D: Hip abductors and rotators, quadruped  1. Get on your hands and knees on a firm, lightly padded surface. Your hands should be directly below your shoulders, and your knees should be directly below your hips. 2. Lift your left / right knee out to the side. Keep your knee  bent. Do not twist your body. 3. Hold this position for 30 seconds. 4. Slowly lower your leg. Repeat 3 times. Complete this exercise 3 times a day. Exercise E: Straight leg raises (hip extensors) 1. Lie on your abdomen on a bed or a firm surface with a pillow under your hips. 2. Squeeze your buttock muscles and lift your left / right thigh off the bed. Do not let your back arch. 3. Hold this position for 45 seconds. 4. Slowly return to the starting position. Let your muscles relax completely before doing another repetition. Repeat 3 times. Complete this exercise 3 times a day. Document Released:  11/17/2005 Document Revised: 07/22/2016 Document Reviewed: 10/30/2015 Elsevier Interactive Patient Education  2019 [employer].

## 2019-05-24 NOTE — Progress Notes (Signed)
CC: left gluteal pain   HPI:  [name] [name] is a 53 y.o. female with PMHx listed below who presents for progressive left gluteal pain. Patient noticed approximately 1 year ago what she describes a small knot deep in her left gluteal region. Since onset, she states it has gotten larger to approximately the size of a dime. It has also become more painful since onset. She describes it as a constant throb with sharp shooting pain down the back of her leg that also wraps around to the front as well. The pain is worse at night, but also occurs sporadically throughout the day. She has been taking her prescribed Percocet at night to help her sleep with moderate relief.  She has a history of thyroid cancer and has been concerned that this knot might be cancer.   She denies any skin changes, numbness/tingling, bone pain, weight loss, bowel or bladder changes.   Past Medical History:  Diagnosis Date  . Abdominal pain 06/25/2010   CT abd/pelvis 05/2014: Very large amount of stool noted distending the colon. The size consistent with severe constipation/fecal impaction.    . Abnormal cervical Papanicolaou smear 04/17/2017  . Anemia   . Arthritis    lumbar region- radiculopathy  . Cancer [facility]) 2013    Thyroid Ca-thyroidectomy  . Chronic fatigue   . DM (diabetes mellitus) ([place])   . Gait disturbance   . GERD (gastroesophageal reflux disease)   . Headache(784.0)   . History of osteoporosis 05/25/2007   DEXA 2014 Since the baseline examination in 2002, there has been a  statistically significant 19.5% increase in bone mineral density in  the lumbar spine and a statistically significant 14.6% increase in  bone mineral density in the left femur.   DEXA 2002 with osteoporosis of lumbar spine and moderate osteopenia of left hip MRI 2002 left hip negative for avascular necrosis  Qualifier: Diagnosis o  . HTN (hypertension)   . Hypothyroidism   . Insomnia 01/02/2015  . Intermittent left-sided chest pain  01/26/2013  . Nephrolithiasis   . Obesity   . OSA (obstructive sleep apnea)    last test- many yrs. ago, done at [facility], no CPAP in use  . Osteoporosis   . Peripheral neuropathy   . Prolonged QT interval   . 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   Physical Exam:  Vitals:   05/24/19 0846  BP: 128/70  Pulse: 100  Temp: 98.4 F (36.9 C)  TempSrc: Oral  SpO2: 100%  Weight: 219 lb (99.3 kg)  Height: 5\' 8"  (1.727 m)   Physical Exam Constitutional:      General: She is not in acute distress.    Appearance: She is obese.  Eyes:     Conjunctiva/sclera: Conjunctivae normal.  Musculoskeletal:     Comments: Normal ROM of hip. Tenderness to palpation along IT band. No boney tenderness along pelvis. Small knot noted with deep palpation along piriformis muscle.   Skin:    General: Skin is warm and dry.     Findings: No bruising, erythema, lesion or rash.  Neurological:     General: No focal deficit present.     Mental Status: She is alert and oriented to person, place, and time.  Psychiatric:        Mood and Affect: Mood normal.        Behavior: Behavior normal.  Assessment & Plan:   See Encounters Tab for problem based charting.  Patient discussed with Dr. Lynnae January

## 2019-05-27 ENCOUNTER — Ambulatory Visit (HOSPITAL_COMMUNITY)
Admission: RE | Admit: 2019-05-27 | Discharge: 2019-05-27 | Disposition: A | Discharge status: Home / Self Care | Payer: Medicaid Other | Source: Ambulatory Visit | Attending: Internal Medicine | Admitting: Internal Medicine

## 2019-05-27 ENCOUNTER — Other Ambulatory Visit: Payer: Self-pay

## 2019-05-27 DIAGNOSIS — M7918 Myalgia, other site: Secondary | ICD-10-CM | POA: Insufficient documentation

## 2019-05-27 NOTE — Progress Notes (Signed)
Internal Medicine Clinic Attending  Case discussed with Dr. Bloomfield at the time of the visit.  We reviewed the resident's history and exam and pertinent patient test results.  I agree with the assessment, diagnosis, and plan of care documented in the resident's note.  

## 2019-05-31 ENCOUNTER — Encounter: Payer: Self-pay | Admitting: *Deleted

## 2019-06-02 ENCOUNTER — Telehealth: Payer: Self-pay | Admitting: *Deleted

## 2019-06-02 ENCOUNTER — Telehealth: Payer: Self-pay | Admitting: Internal Medicine

## 2019-06-02 NOTE — Telephone Encounter (Signed)
Patient called requesting xray results from last week.

## 2019-06-02 NOTE — Telephone Encounter (Signed)
Pt called / informed "Her ultrasound results were normal. She can continue treatment for piriformis syndrome (discussed at visit). Has appointment with PCP on 7/15 if her pain persists." per Dr Koleen Distance. Stated the pain is still there; she can feel a lump and the pain is very bad. Stated she has been in the bed all day d/t the pain. Told her I will inform the doctor.

## 2019-06-02 NOTE — Telephone Encounter (Signed)
Attempted to return call to patient. Her ultrasound results were normal. She can continue treatment for piriformis syndrome (discussed at visit). Has appointment with PCP on 7/15 if her pain persists.

## 2019-06-02 NOTE — Telephone Encounter (Signed)
See Dr Janne Napoleon telephone from today.

## 2019-06-03 NOTE — Telephone Encounter (Signed)
Hi Glenda,   Do I have any availability on my schedule soon for her to see me?

## 2019-06-06 ENCOUNTER — Telehealth: Payer: Self-pay | Admitting: *Deleted

## 2019-06-06 ENCOUNTER — Other Ambulatory Visit: Payer: Self-pay | Admitting: Internal Medicine

## 2019-06-06 MED ORDER — ATORVASTATIN CALCIUM 20 MG PO TABS: ORAL_TABLET | ORAL | 1 refills | Status: DC

## 2019-06-06 NOTE — Telephone Encounter (Signed)
Received fax from [facility] stating, "We spoke to your patient about diabetes care and noticed your patient has not filled a statin in the last 180 days. Your patient would like [name] to reach out on their behalf to determine if it is appropriate to start a statin therapy. Please send a new prescription for statin therapy if it is appropriate." L. Silvano Rusk, RN, BSN

## 2019-06-06 NOTE — Telephone Encounter (Signed)
Hi Dr. Eileen Stanford, As of now, you do not have any availability on you schedule before pt's appt on 7/15. Thanks, [employer]

## 2019-06-06 NOTE — Telephone Encounter (Signed)
Ok. Thanks!

## 2019-06-06 NOTE — Telephone Encounter (Signed)
Hi [name] [name],   She has risk factors for an ischemic event (Diabetes, HTN) and I have refilled the medication.   Thanks

## 2019-06-15 ENCOUNTER — Encounter: Payer: Self-pay | Admitting: Internal Medicine

## 2019-06-15 ENCOUNTER — Other Ambulatory Visit: Payer: Self-pay

## 2019-06-15 ENCOUNTER — Ambulatory Visit: Payer: Medicaid Other | Admitting: Internal Medicine

## 2019-06-15 VITALS — BP 140/78 | HR 92 | Temp 99.1°F | Ht 68.0 in | Wt 218.6 lb

## 2019-06-15 DIAGNOSIS — M5442 Lumbago with sciatica, left side: Secondary | ICD-10-CM | POA: Diagnosis present

## 2019-06-15 DIAGNOSIS — G8929 Other chronic pain: Secondary | ICD-10-CM | POA: Diagnosis not present

## 2019-06-15 DIAGNOSIS — Z79899 Other long term (current) drug therapy: Secondary | ICD-10-CM | POA: Diagnosis not present

## 2019-06-15 DIAGNOSIS — E118 Type 2 diabetes mellitus with unspecified complications: Secondary | ICD-10-CM

## 2019-06-15 DIAGNOSIS — Z7984 Long term (current) use of oral hypoglycemic drugs: Secondary | ICD-10-CM

## 2019-06-15 DIAGNOSIS — Z981 Arthrodesis status: Secondary | ICD-10-CM

## 2019-06-15 DIAGNOSIS — I10 Essential (primary) hypertension: Secondary | ICD-10-CM | POA: Diagnosis not present

## 2019-06-15 DIAGNOSIS — E119 Type 2 diabetes mellitus without complications: Secondary | ICD-10-CM | POA: Diagnosis not present

## 2019-06-15 DIAGNOSIS — M5386 Other specified dorsopathies, lumbar region: Secondary | ICD-10-CM

## 2019-06-15 LAB — POCT GLYCOSYLATED HEMOGLOBIN (HGB A1C): Hemoglobin A1C: 10.1 % — AB (ref 4.0–5.6)

## 2019-06-15 LAB — GLUCOSE, CAPILLARY: Glucose-Capillary: 189 mg/dL — ABNORMAL HIGH (ref 70–99)

## 2019-06-15 MED ORDER — BLOOD GLUCOSE MONITOR KIT: PACK | 0 refills | Status: AC

## 2019-06-15 MED ORDER — LIRAGLUTIDE 18 MG/3ML ~~LOC~~ SOPN: 0.6000 mg | PEN_INJECTOR | Freq: Every day | SUBCUTANEOUS | 3 refills | Status: DC

## 2019-06-15 MED ORDER — BLOOD GLUCOSE MONITOR KIT: PACK | 0 refills | Status: DC

## 2019-06-15 NOTE — Assessment & Plan Note (Signed)
Type 2 diabetes mellitus: A1c in October 2019 was 8.7%, previously 7.7% in May 2019.  She was previously prescribed glipizide 5 mg daily though she reports of sensation of hypoglycemia though she does not monitor CBGs.  She has since discontinued glipizide.  Her A1c today is 10.1%.   Plan: -Continue metformin 1000 mg twice daily -Start Victoza (0.6 mg subcutaneous daily for 1 week then 1.2 mg daily) -Will give prescription for glucometer and monitor CBG 3 times daily

## 2019-06-15 NOTE — Addendum Note (Signed)
Addended by: Jean Rosenthal on: 06/15/2019 05:04 PM   Modules accepted: Orders

## 2019-06-15 NOTE — Assessment & Plan Note (Signed)
Hypertension:  BP Readings from Last 3 Encounters:  06/15/19 140/78  05/24/19 128/70  03/14/19 (!) 154/80   Plan: -Continue lisinopril 20 mg daily

## 2019-06-15 NOTE — Assessment & Plan Note (Signed)
Left gluteal/lumbar back pain: She was evaluated by Dr. Smitty Cords in June 2020 and complained of a one-year history of left gluteal pain for which she reported of worsening "knot/bump "on her left gluteal area.  She was worried about possible mass and an ultrasound was performed which was unremarkable.  Today, she still endorses left back/left gluteal pain which she reports is worse when she lies flat to sleep or when she stands for long period of time.  She denies any red flag symptoms such as fevers, point tenderness to palpation, bladder or urinary incontinence, fecal incontinence, lower extremity weakness.  She does have a history of L4-L5 microdiscectomy due to lumbar fusion in 2017.  Follow-up MRI in 2018 showed L4-5 disc bulging; left posterior lamincetomy, left lateral recess stenosis and moderate left foraminal stenosis.  I personally gave her follow-up to see neurosurgery at [facility] which she reports of having a repeat MRI of the spine though I do not have records of this.  Plan: -Obtain records from [facility] -Follow-up with [facility] for possible intra-articular injection or surgical intervention. -Continue pain medication from pain clinic (Percocet 10-325 mg every 4 as needed) -Continue Flexeril

## 2019-06-15 NOTE — Patient Instructions (Signed)
Ms. Purdie,  It was a pleasure taking care of your the clinic today.  I am sorry to hear about your ongoing pain.  As we discussed in the room, this might be due to a bulging disc or the narrowing of your spine bones.  I am going to get records from [facility].  I am also going to check your A1c today and hopefully start you on another medication that would not cause your blood glucose to drop.  Take care! Dr. Eileen Stanford

## 2019-06-15 NOTE — Progress Notes (Addendum)
   CC: Left gluteal/back pain  HPI:  [name] is a 53 y.o. with medical history listed below presenting for evaluation of left gluteal/back pain.  Please see problem based charting for further details.  Past Medical History:  Diagnosis Date  . Abdominal pain 06/25/2010   CT abd/pelvis 05/2014: Very large amount of stool noted distending the colon. The size consistent with severe constipation/fecal impaction.    . Abnormal cervical Papanicolaou smear 04/17/2017  . Anemia   . Arthritis    lumbar region- radiculopathy  . Cancer [facility]) 2013    Thyroid Ca-thyroidectomy  . Chronic fatigue   . DM (diabetes mellitus) ([place])   . Gait disturbance   . GERD (gastroesophageal reflux disease)   . Headache(784.0)   . History of osteoporosis 05/25/2007   DEXA 2014 Since the baseline examination in 2002, there has been a  statistically significant 19.5% increase in bone mineral density in  the lumbar spine and a statistically significant 14.6% increase in  bone mineral density in the left femur.   DEXA 2002 with osteoporosis of lumbar spine and moderate osteopenia of left hip MRI 2002 left hip negative for avascular necrosis  Qualifier: Diagnosis o  . HTN (hypertension)   . Hypothyroidism   . Insomnia 01/02/2015  . Intermittent left-sided chest pain 01/26/2013  . Left sided numbness 12/10/2018  . Nephrolithiasis   . Obesity   . OSA (obstructive sleep apnea)    last test- many yrs. ago, done at [facility], no CPAP in use  . Osteoporosis   . Pain and swelling of lower leg, left 12/16/2017  . Peripheral neuropathy   . Prolonged QT interval   . 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   Constitutional:      Appearance: She is obese.  HENT:     Head: Normocephalic and atraumatic.  Cardiovascular:     Rate and Rhythm: Normal rate.     Pulses: Normal pulses.     Heart sounds: Normal heart sounds. No murmur.  Pulmonary:     Effort: Pulmonary effort is normal.     Breath sounds: Normal breath sounds. No wheezing.  Musculoskeletal: Normal range of motion.        General: No swelling, tenderness, deformity or signs of injury.     Right lower leg: No edema.     Left lower leg: No edema.     Comments: Mildly TTP at the paraspinal lumbar region   Neurological:     General: No focal deficit present.     Mental Status: She is alert. Mental status is at baseline.     Motor: No weakness.     Gait: Gait normal.     Assessment & Plan:   See Encounters Tab for problem based charting.  Patient discussed with Dr. Dareen Piano

## 2019-06-16 ENCOUNTER — Telehealth: Payer: Self-pay | Admitting: *Deleted

## 2019-06-16 NOTE — Progress Notes (Signed)
Internal Medicine Clinic Attending  Case discussed with Dr. Agyei at the time of the visit.  We reviewed the resident's history and exam and pertinent patient test results.  I agree with the assessment, diagnosis, and plan of care documented in the resident's note.    

## 2019-06-16 NOTE — Telephone Encounter (Signed)
Attempted to call Rx for glucose monitor to [facility] at [facility], however, was instructed this would need to be faxed. faxed to [facility] at [phone]. Hubbard Hartshorn, RN, BSN

## 2019-06-20 ENCOUNTER — Other Ambulatory Visit: Payer: Self-pay | Admitting: *Deleted

## 2019-06-20 NOTE — Telephone Encounter (Signed)
[facility] from [facility] - pt needs rx for BD UF Short Pen Needles 8 mm x 31 G to use with Victoza; not on current med list. Thanks

## 2019-06-21 ENCOUNTER — Ambulatory Visit: Payer: Medicaid Other

## 2019-06-21 ENCOUNTER — Encounter: Payer: Medicaid Other | Admitting: Podiatry

## 2019-06-21 ENCOUNTER — Other Ambulatory Visit: Payer: Self-pay | Admitting: Internal Medicine

## 2019-06-21 DIAGNOSIS — M7750 Other enthesopathy of unspecified foot: Secondary | ICD-10-CM

## 2019-06-21 DIAGNOSIS — E119 Type 2 diabetes mellitus without complications: Secondary | ICD-10-CM

## 2019-06-21 MED ORDER — BD PEN NEEDLE SHORT U/F 31G X 8 MM MISC: 3 refills | Status: DC

## 2019-06-21 NOTE — Telephone Encounter (Signed)
Thanks

## 2019-06-21 NOTE — Progress Notes (Signed)
This encounter was created in error - please disregard.

## 2019-06-23 ENCOUNTER — Ambulatory Visit: Payer: Medicaid Other | Admitting: Podiatry

## 2019-06-23 ENCOUNTER — Ambulatory Visit (INDEPENDENT_AMBULATORY_CARE_PROVIDER_SITE_OTHER): Payer: Medicaid Other

## 2019-06-23 ENCOUNTER — Encounter: Payer: Self-pay | Admitting: Podiatry

## 2019-06-23 ENCOUNTER — Other Ambulatory Visit: Payer: Self-pay

## 2019-06-23 VITALS — Temp 96.7°F

## 2019-06-23 DIAGNOSIS — M7752 Other enthesopathy of left foot: Secondary | ICD-10-CM

## 2019-06-23 DIAGNOSIS — M549 Dorsalgia, unspecified: Secondary | ICD-10-CM

## 2019-06-23 DIAGNOSIS — G8929 Other chronic pain: Secondary | ICD-10-CM

## 2019-06-23 MED ORDER — DICLOFENAC SODIUM 1 % TD GEL: 4.0000 g | Freq: Four times a day (QID) | TRANSDERMAL | 2 refills | Status: DC

## 2019-06-23 NOTE — Progress Notes (Signed)
She presents today complaining of pain to the dorsal and dorsal lateral aspect of the left foot.  States that is hurting real bad she says sometimes he gets hot and sometimes gets numb states also that seems to swell a lot.  States that she had back surgery by Dr. Joya Salm several years ago and has since had an MRI by a different neurosurgeon.  However she states that she was never contacted with any of the results from the neurosurgery group and felt that he did not have her best interest at heart.  She would like to get back to the group but would like to have another surgeon if possible.  Objective: Vital signs are stable she is alert and oriented x3.  Pulses are palpable.  She has some reduced reproducible pain along the sinus tarsi area of the left foot but more than likely this is associated with the swelling.  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  She has a positive straight leg test with pain radiating in her back and buttocks to her foot.  Numbness and tingling in her left foot.  Assessment: Probable radiculopathy resulting from her back impingement.  Moderate edema left lower extremity.  Plan: At this point we will make a referral to [place] [facility] and remind them that an MRI have been performed with no results.

## 2019-07-02 ENCOUNTER — Other Ambulatory Visit: Payer: Self-pay | Admitting: Internal Medicine

## 2019-07-02 DIAGNOSIS — I10 Essential (primary) hypertension: Secondary | ICD-10-CM

## 2019-07-04 ENCOUNTER — Other Ambulatory Visit: Payer: Self-pay | Admitting: Student in an Organized Health Care Education/Training Program

## 2019-07-05 ENCOUNTER — Other Ambulatory Visit: Payer: Self-pay

## 2019-07-05 ENCOUNTER — Ambulatory Visit
Admission: RE | Admit: 2019-07-05 | Discharge: 2019-07-05 | Disposition: A | Discharge status: Home / Self Care | Payer: Medicaid Other | Source: Ambulatory Visit | Attending: Internal Medicine | Admitting: Internal Medicine

## 2019-07-05 DIAGNOSIS — Z1231 Encounter for screening mammogram for malignant neoplasm of breast: Secondary | ICD-10-CM

## 2019-07-15 ENCOUNTER — Other Ambulatory Visit: Payer: Self-pay | Admitting: Student

## 2019-07-15 DIAGNOSIS — M5442 Lumbago with sciatica, left side: Secondary | ICD-10-CM

## 2019-08-02 ENCOUNTER — Encounter: Payer: Self-pay | Admitting: Neurology

## 2019-08-02 ENCOUNTER — Ambulatory Visit: Payer: Medicaid Other | Admitting: Neurology

## 2019-08-02 ENCOUNTER — Other Ambulatory Visit: Payer: Self-pay

## 2019-08-02 VITALS — BP 141/94 | HR 98 | Temp 97.3°F | Ht 68.0 in | Wt 216.3 lb

## 2019-08-02 DIAGNOSIS — Z5181 Encounter for therapeutic drug level monitoring: Secondary | ICD-10-CM

## 2019-08-02 DIAGNOSIS — D8689 Sarcoidosis of other sites: Secondary | ICD-10-CM

## 2019-08-02 MED ORDER — METHOTREXATE 2.5 MG PO TABS: 7.5000 mg | ORAL_TABLET | ORAL | 1 refills | Status: DC

## 2019-08-02 NOTE — Patient Instructions (Signed)
Reduce the methotrexate 2.5 mg to 3 tablets once a week for 3 months, then reduce to 2 tablets once a week.

## 2019-08-02 NOTE — Progress Notes (Signed)
Reason for visit: Neurosarcoidosis  [name] [name] is an 53 y.o. female  History of present illness:  [name] [name] is a 53 year old right-handed white female with a history of neurosarcoidosis and history of chronic headache.  The patient has been on methotrexate taking 10 mg once a week.  She had MRI of the brain done in February 2020 that showed no active disease.  The patient has had a lot of other issues, she has left shoulder pain associated with a rotator cuff tear, she has been seen through orthopedic surgery for this and she was told that she needed surgery but she wishes to postpone this.  She also has significant left leg pain and sciatica down the left leg, she is seen through a pain center.  She will be having a myelogram with CT to follow-up in the near future.  She also has had prior left foot surgery and has significant swelling and pain in the foot, her podiatry physician has not identified the source of the pain.  She also has significant issues with constipation, she has stomach cramps, difficulty passing stools.  She is followed by Dr. Benson Norway for this.  The patient comes back to this office for further evaluation.  Past Medical History:  Diagnosis Date  . Abdominal pain 06/25/2010   CT abd/pelvis 05/2014: Very large amount of stool noted distending the colon. The size consistent with severe constipation/fecal impaction.    . Abnormal cervical Papanicolaou smear 04/17/2017  . Anemia   . Arthritis    lumbar region- radiculopathy  . Cancer [facility]) 2013    Thyroid Ca-thyroidectomy  . Chronic fatigue   . DM (diabetes mellitus) ([place])   . Gait disturbance   . GERD (gastroesophageal reflux disease)   . Headache(784.0)   . History of osteoporosis 05/25/2007   DEXA 2014 Since the baseline examination in 2002, there has been a  statistically significant 19.5% increase in bone mineral density in  the lumbar spine and a statistically significant 14.6% increase in  bone mineral density in  the left femur.   DEXA 2002 with osteoporosis of lumbar spine and moderate osteopenia of left hip MRI 2002 left hip negative for avascular necrosis  Qualifier: Diagnosis o  . HTN (hypertension)   . Hypothyroidism   . Insomnia 01/02/2015  . Intermittent left-sided chest pain 01/26/2013  . Left sided numbness 12/10/2018  . Nephrolithiasis   . Obesity   . OSA (obstructive sleep apnea)    last test- many yrs. ago, done at [facility] no CPAP in use  . Osteoporosis   . Pain and swelling of lower leg, left 12/16/2017  . Peripheral neuropathy   . Prolonged QT interval   . 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 BRAIN SURGERY  2001   for sarcoidosis  . BREAST BIOPSY Left   . BREAST BIOPSY Right   . BREAST EXCISIONAL BIOPSY Right    x2 benign  . BREAST EXCISIONAL BIOPSY Left    benign  . BREAST SURGERY Bilateral    biopsies   . DILATION AND CURETTAGE OF UTERUS    . HYSTEROSCOPY    . LUMBAR LAMINECTOMY/DECOMPRESSION MICRODISCECTOMY Left 08/20/2016   Procedure: Left Lumbar four-five Microdiskectomy;  Surgeon: Leeroy Cha, MD;  Location: [facility];  Service: Neurosurgery;  Laterality: Left;  . PARTIAL HYSTERECTOMY  2013  . ROTATOR CUFF REPAIR Right    07/2013  . Suboccipital  craniotomy    . THYROIDECTOMY     Precancerous lesion    Family History  Problem Relation Age of Onset  . Heart disease Mother        questionable CAD and arrythmias   . Cancer Mother        Breast cancer  . Breast cancer Mother 67  . Venous thrombosis Sister   . Diabetes Sister   . Heart disease Sister   . Diabetes Brother   . Heart disease Brother   . Cancer Maternal Aunt   . Breast cancer Maternal Aunt   . Venous thrombosis Sister   . Diabetes Sister   . Heart disease Sister     Social history:  reports that she has never smoked. She has never used smokeless tobacco. She reports that she does not drink alcohol or use drugs.    No Known Allergies  Medications:  Prior to Admission medications   Medication Sig Start Date End Date Taking? Authorizing Provider  aspirin 81 MG chewable tablet Chew 81 mg by mouth daily.    [provider]  atorvastatin (LIPITOR) 20 MG tablet TAKE 1 TABLET BY MOUTH EVERY DAY IN THE EVENING 06/06/19   Jean Rosenthal, MD  blood glucose meter kit and supplies KIT Dispense based on patient and insurance preference. Use up to four times daily as directed. (FOR ICD-9 250.00, 250.01). 06/15/19   Jean Rosenthal, MD  carvedilol (COREG) 3.125 MG tablet Take 1 tablet (3.125 mg total) by mouth 2 (two) times daily. 03/30/19   Lelon Perla, MD  diclofenac sodium (VOLTAREN) 1 % GEL Apply 4 g topically 4 (four) times daily. 06/23/19   Hyatt, Max T, DPM  DULoxetine (CYMBALTA) 30 MG capsule Take by mouth daily. 04/02/19   [provider]  fluticasone (FLONASE) 50 MCG/ACT nasal spray PLACE 1 SPRAY INTO BOTH NOSTRILS DAILY. 03/30/19 03/29/20  Aldine Contes, MD  folic acid (FOLVITE) 1 MG tablet TAKE 1 TABLET BY MOUTH EVERY DAY 02/24/19   Jean Rosenthal, MD  ibuprofen (ADVIL,MOTRIN) 600 MG tablet Take 1 tablet (600 mg total) by mouth every 6 (six) hours as needed. 01/28/19   Santos-Sanchez, Merlene Morse, MD  Insulin Pen Needle (B-D ULTRAFINE III SHORT PEN) 31G X 8 MM MISC Use as directed. 06/21/19   Jean Rosenthal, MD  levothyroxine (SYNTHROID) 150 MCG tablet TAKE 1 TABLET BY MOUTH EVERY DAY BEFORE BREAKFAST 04/27/19   Agyei, Caprice Kluver, MD  liraglutide (VICTOZA) 18 MG/3ML SOPN Inject 0.1 mLs (0.6 mg total) into the skin daily. 0.6 mg once a day for 1 week then increase to 1.2 mg daily afterwards. 06/15/19   Agyei, Caprice Kluver, MD  lisinopril (ZESTRIL) 10 MG tablet TAKE 2 TABLETS EVERY DAY 07/05/19   Agyei, Caprice Kluver, MD  metFORMIN (GLUCOPHAGE) 1000 MG tablet TAKE 1 TABLET BY MOUTH TWICE A DAY WITH MEALS 04/12/19   Agyei, Caprice Kluver, MD  methotrexate (RHEUMATREX) 2.5 MG tablet TAKE 4 TABLETS (10 MG TOTAL) BY MOUTH ONCE A WEEK.  CAUTION:CHEMOTHERAPY. PROTECT FROM LIGHT. 05/18/19   Kathrynn Ducking, MD  Multiple Vitamin (MULTIVITAMIN WITH MINERALS) TABS tablet Take 1 tablet by mouth every evening.     [provider]  NARCAN 4 MG/0.1ML LIQD nasal spray kit PLEASE SEE ATTACHED FOR DETAILED DIRECTIONS 04/06/19   [provider]  oxyCODONE-acetaminophen (PERCOCET) 10-325 MG tablet Take 1 tablet by mouth every 4 (four) hours as needed for pain.    [provider]  pantoprazole (PROTONIX) 20  MG tablet TAKE 1 TABLET BY MOUTH EVERY DAY 07/05/19   Agyei, Caprice Kluver, MD  traMADol (ULTRAM-ER) 200 MG 24 hr tablet Take 200 mg by mouth daily. 03/28/19   [provider]    ROS:  Out of a complete 14 system review of symptoms, the patient complains only of the following symptoms, and all other reviewed systems are negative.  Abdominal pain and cramps, constipation Left leg pain, sciatica Left shoulder discomfort Headache  Last menstrual period 12/03/2011.  Physical Exam  General: The patient is alert and cooperative at the time of the examination.  Skin: No significant peripheral edema is noted.   Neurologic Exam  Mental status: The patient is alert and oriented x 3 at the time of the examination. The patient has apparent normal recent and remote memory, with an apparently normal attention span and concentration ability.   Cranial nerves: Facial symmetry is present. Speech is normal, no aphasia or dysarthria is noted. Extraocular movements are full. Visual fields are full.  Motor: The patient has good strength in all 4 extremities.  Sensory examination: Soft touch sensation is symmetric on the face, arms, and legs.  Coordination: The patient has good finger-nose-finger and heel-to-shin bilaterally.  Gait and station: The patient has a normal gait. Tandem gait is unsteady.  Romberg is negative but is unsteady.  No drift is seen.  Reflexes: Deep tendon reflexes are symmetric.   MRI brain  01/15/19:  IMPRESSION: 1. Stable postoperative changes in the posterior fossa. 2. No evidence of acute intracranial abnormality or active neurosarcoidosis. 3. Right maxillary sinus fluid and mucosal thickening, correlate for acute sinusitis.  * MRI scan images were reviewed online. I agree with the written report.    Assessment/Plan:  1.  History of frequent headache  2.  Neurosarcoidosis  3.  Left-sided sciatica  The patient will be getting a myelogram in the near future to help identify the source of the left leg pain.  She is also having left foot pain and abdominal pain and cramps.  Her recent MRI of the brain did not show active neurosarcoidosis, we will reduce the methotrexate to 7.5 mg once a week for 3 months and then go to 5 mg once a week.  She will follow-up in 6 months.  We will check blood work today.  Jill Alexanders MD 08/02/2019 4:06 PM  [facility] [address] [place], [place] [address]  Phone [phone] Fax [phone]

## 2019-08-03 ENCOUNTER — Telehealth: Payer: Self-pay

## 2019-08-03 LAB — CBC WITH DIFFERENTIAL/PLATELET
Basophils Absolute: 0 10*3/uL (ref 0.0–0.2)
Basos: 1 %
EOS (ABSOLUTE): 0.1 10*3/uL (ref 0.0–0.4)
Eos: 1 %
Hematocrit: 38.5 % (ref 34.0–46.6)
Hemoglobin: 12.2 g/dL (ref 11.1–15.9)
Immature Grans (Abs): 0 10*3/uL (ref 0.0–0.1)
Immature Granulocytes: 0 %
Lymphocytes Absolute: 2.4 10*3/uL (ref 0.7–3.1)
Lymphs: 35 %
MCH: 27.1 pg (ref 26.6–33.0)
MCHC: 31.7 g/dL (ref 31.5–35.7)
MCV: 86 fL (ref 79–97)
Monocytes Absolute: 0.5 10*3/uL (ref 0.1–0.9)
Monocytes: 7 %
Neutrophils Absolute: 3.7 10*3/uL (ref 1.4–7.0)
Neutrophils: 56 %
Platelets: 420 10*3/uL (ref 150–450)
RBC: 4.5 x10E6/uL (ref 3.77–5.28)
RDW: 14.3 % (ref 11.7–15.4)
WBC: 6.7 10*3/uL (ref 3.4–10.8)

## 2019-08-03 LAB — COMPREHENSIVE METABOLIC PANEL
ALT: 15 IU/L (ref 0–32)
AST: 20 IU/L (ref 0–40)
Albumin/Globulin Ratio: 1.7 (ref 1.2–2.2)
Albumin: 4.5 g/dL (ref 3.8–4.9)
Alkaline Phosphatase: 80 IU/L (ref 39–117)
BUN/Creatinine Ratio: 20 (ref 9–23)
BUN: 14 mg/dL (ref 6–24)
Bilirubin Total: <0.2 mg/dL (ref 0.0–1.2)
CO2: 25 mmol/L (ref 20–29)
Calcium: 9.7 mg/dL (ref 8.7–10.2)
Chloride: 104 mmol/L (ref 96–106)
Creatinine, Ser: 0.69 mg/dL (ref 0.57–1.00)
GFR calc Af Amer: 117 mL/min/{1.73_m2} (ref >59)
GFR calc non Af Amer: 101 mL/min/{1.73_m2} (ref >59)
Globulin, Total: 2.6 g/dL (ref 1.5–4.5)
Glucose: 86 mg/dL (ref 65–99)
Potassium: 4.8 mmol/L (ref 3.5–5.2)
Sodium: 144 mmol/L (ref 134–144)
Total Protein: 7.1 g/dL (ref 6.0–8.5)

## 2019-08-03 NOTE — Telephone Encounter (Signed)
-----   Message from Kathrynn Ducking, MD sent at 08/03/2019  7:21 AM EDT -----  The blood work results are unremarkable. Please call the patient. ----- Message ----- From: Lavone Neri Lab Results In Sent: 08/03/2019   5:38 AM EDT To: Kathrynn Ducking, MD

## 2019-08-03 NOTE — Telephone Encounter (Signed)
I reached out to pt and advised of results. She verbalized understanding.

## 2019-08-04 ENCOUNTER — Ambulatory Visit: Payer: Medicaid Other | Admitting: Podiatry

## 2019-08-24 ENCOUNTER — Ambulatory Visit: Payer: Medicaid Other | Admitting: Internal Medicine

## 2019-08-24 ENCOUNTER — Other Ambulatory Visit: Payer: Self-pay

## 2019-08-24 ENCOUNTER — Encounter: Payer: Self-pay | Admitting: Internal Medicine

## 2019-08-24 DIAGNOSIS — E119 Type 2 diabetes mellitus without complications: Secondary | ICD-10-CM

## 2019-08-24 DIAGNOSIS — Z23 Encounter for immunization: Secondary | ICD-10-CM

## 2019-08-24 DIAGNOSIS — Z79899 Other long term (current) drug therapy: Secondary | ICD-10-CM | POA: Diagnosis not present

## 2019-08-24 DIAGNOSIS — I5020 Unspecified systolic (congestive) heart failure: Secondary | ICD-10-CM | POA: Insufficient documentation

## 2019-08-24 DIAGNOSIS — I10 Essential (primary) hypertension: Secondary | ICD-10-CM

## 2019-08-24 DIAGNOSIS — K5909 Other constipation: Secondary | ICD-10-CM | POA: Insufficient documentation

## 2019-08-24 DIAGNOSIS — Z7984 Long term (current) use of oral hypoglycemic drugs: Secondary | ICD-10-CM | POA: Diagnosis not present

## 2019-08-24 DIAGNOSIS — I110 Hypertensive heart disease with heart failure: Secondary | ICD-10-CM

## 2019-08-24 NOTE — Progress Notes (Signed)
   CC: Follow-up diabetes mellitus  HPI:  [name] is a 53 y.o. very pleasant African-American woman with medical history listed below presenting to follow-up on chronic medical problems.  Please see problem based charting for further details.  Past Medical History:  Diagnosis Date  . Abdominal pain 06/25/2010   CT abd/pelvis 05/2014: Very large amount of stool noted distending the colon. The size consistent with severe constipation/fecal impaction.    . Abnormal cervical Papanicolaou smear 04/17/2017  . Anemia   . Arthritis    lumbar region- radiculopathy  . Cancer [facility]) 2013    Thyroid Ca-thyroidectomy  . Chronic fatigue   . DM (diabetes mellitus) ([place])   . Gait disturbance   . GERD (gastroesophageal reflux disease)   . Headache(784.0)   . Herniated lumbar disc without myelopathy 08/20/2016  . History of osteoporosis 05/25/2007   DEXA 2014 Since the baseline examination in 2002, there has been a  statistically significant 19.5% increase in bone mineral density in  the lumbar spine and a statistically significant 14.6% increase in  bone mineral density in the left femur.   DEXA 2002 with osteoporosis of lumbar spine and moderate osteopenia of left hip MRI 2002 left hip negative for avascular necrosis  Qualifier: Diagnosis o  . HTN (hypertension)   . Hypothyroidism   . Insomnia 01/02/2015  . Insomnia 01/02/2015  . Intermittent left-sided chest pain 01/26/2013  . Left sided numbness 12/10/2018  . Nephrolithiasis   . Obesity   . OSA (obstructive sleep apnea)    last test- many yrs. ago, done at [facility], no CPAP in use  . Osteoporosis   . Pain and swelling of lower leg, left 12/16/2017  . Peripheral neuropathy   . Prolonged QT interval   . 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  HENT:  Head: Normocephalic and atraumatic.  Cardiovascular: Normal rate, regular rhythm and normal heart sounds.  No murmur heard. Pulmonary/Chest: Effort normal and breath sounds normal. She has no wheezes. She has no rales.  Abdominal: Soft. Bowel sounds are normal. She exhibits no distension. There is no abdominal tenderness.  Musculoskeletal: Normal range of motion.        General: No edema.    Assessment & Plan:   See Encounters Tab for problem based charting.  Patient discussed with Dr. Lynnae January

## 2019-08-24 NOTE — Assessment & Plan Note (Signed)
Chronic constipation: [name] [name] tells me today she has been suffering from chronic constipation for over 10 years now.  She usually has about 1-2 bowel movements per week and sometimes she feels "stuck "in her lower abdomen.  She has been following up with gastroenterology (Dr. Benson Norway).  She has undergone evasive stool disimpaction at his office with last procedure being about 3 years ago.  Other measures for controlling her constipation has included MiraLAX, enema, suppository and senna.  She did have hysterectomy in 2007 making that a less likely cause of her symptoms.  From patient's description, it seems that she might have dyssynergic constipation however cannot rule out slow transit constipation.  Plan: - Follow-up with gastroenterology (plan is to refer to [facility] for further evaluation) - Will obtain records from Dr. Ulyses Amor office

## 2019-08-24 NOTE — Assessment & Plan Note (Signed)
Hypertension: Initial blood pressure at 149/81 however repeat was 139/87 with a pulse of 98.  BP Readings from Last 3 Encounters:  08/24/19 139/87  08/02/19 (!) 141/94  06/15/19 140/78   Plan: -Continue lisinopril 20 mg daily - Continue Coreg 3.125 mg BID

## 2019-08-24 NOTE — Assessment & Plan Note (Signed)
Type 2 diabetes mellitus: Uncontrolled.  Last A1c was 10.1% on June 15, 2019 and Victoza was initiated.  She does not monitor CBGs regularly but is compliant to medications.  Plan: - Placed lab order only for hemoglobin A1c in about a month -Continue metformin 1000 mg twice daily -Continue Victoza 1.2 mg daily

## 2019-08-24 NOTE — Assessment & Plan Note (Signed)
Her recent echocardiogram in January 2020 showed left ventricular ejection fraction of 45 to 50% with mild diastolic dysfunction per chart review, she did have a nuclear study performed which showed no evidence of ischemia or infarction.  She follows up with Dr. Stanford Breed and last visit was April 2020.  At that visit, Coreg was added to her medication list.   Plan: -Continue lisinopril 20 mg daily -Continue Coreg 6.25 mg BID -Follow-up with cardiology was considering cardiac MRI and possible cardiac CTA

## 2019-08-24 NOTE — Patient Instructions (Addendum)
[name],   It was a pleasure taking care of you in the clinic today.  I am glad you have been up-to-date on all your medications and that I was able to answer all your questions.  Regarding the constipation, I will follow-up with Dr. Janan Halter and [facility].  As we discussed, you can call his office and ask exactly when your appointment with [facility] is.  For patients with chronic constipation they can either have "slow transit constipation "or "dyssynergic constipation."  Here is just an information for you if you want a read more about it.  Take Care! Dr. Eileen Stanford  Please call the internal medicine center clinic if you have any questions or concerns, we may be able to help and keep you from a long and expensive emergency room wait. Our clinic and after hours phone number is [phone], the best time to call is Monday through Friday 9 am to 4 pm but there is always someone available 24/7 if you have an emergency. If you need medication refills please notify your pharmacy one week in advance and they will send [name] a request.    Chronic Constipation  Chronic constipation is a condition in which a person has three or fewer bowel movements a week, for three months or longer. This condition is especially common in older adults. The two main kinds of chronic constipation are secondary constipation and functional constipation. Secondary constipation results from another condition or a treatment. Functional constipation, also called primary or idiopathic constipation, is divided into three types:  Normal transit constipation. In this type, movement of stool through the colon (stool transit) occurs normally.  Slow transit constipation. In this type, stool moves slowly through the colon.  Outlet constipation or pelvic floor dysfunction. In this type, the nerves and muscles that empty the rectum do not work normally. What are the causes? Causes of secondary constipation may include:  Failing to drink enough fluid,  eat enough food or fiber, or get physically active.  Pregnancy.  A tear in the anus (anal fissure).  Blockage in the bowel (bowel obstruction).  Narrowing of the bowel (bowel stricture).  Having a long-term medical condition, such as: ? Diabetes. ? Hypothyroidism. ? Multiple sclerosis. ? Parkinson disease. ? Stroke. ? Spinal cord injury. ? Dementia. ? Colon cancer. ? Inflammatory bowel disease (IBD). ? Iron-deficiency anemia. ? Outward collapse of the rectum (rectal prolapse). ? Hemorrhoids.  Taking certain medicines, including: ? Narcotics. These are a certain type of prescription pain medicine. ? Antacids. ? Iron supplements. ? Water pills (diuretics). ? Certain blood pressure medicines. ? Anti-seizure medicines. ? Antidepressants. ? Medicines for Parkinson disease. The cause of functional constipation is not known, but some conditions are associated with it. These conditions include:  Stress.  Problems in the nerves and muscles that control stool transit.  Weak or impaired pelvic floor muscles. What increases the risk? You may be at higher risk for chronic constipation if you:  Are older than age 44.  Are female.  Live in a long-term care facility.  Do not get much exercise or physical activity (have a sedentary lifestyle).  Do not drink enough fluids.  Do not eat enough food, especially fiber.  Have a long-term disease.  Have a mental health disorder or eating disorder.  Take many medicines. What are the signs or symptoms? The main symptom of chronic constipation is having three or fewer bowel movements a week for several weeks. Other signs and symptoms may vary from person to person.  These include:  Pushing hard (straining) to pass stool.  Painful bowel movements.  Having hard or lumpy stools.  Having lower belly discomfort, such as cramps or bloating.  Being unable to have a bowel movement when you feel the urge.  Feeling like you still  need to pass stool after a bowel movement.  Feeling that you have something in your rectum that is blocking or preventing bowel movements.  Seeing blood on the toilet paper or in your stool.  Worsening confusion (in older adults). How is this diagnosed? This condition may be diagnosed based on:  Symptoms and medical history. You will be asked about your symptoms, lifestyle, diet, and any medicines that you are taking.  Physical exam. ? Your belly (abdomen) will be examined. ? A digital rectal exam may be done. For this exam, a health care provider places a lubricated, gloved finger into the rectum.  Other tests to check for any underlying causes of your constipation. These may be ordered if you have bleeding in your rectum, weight loss, or a family history of colon cancer. In these cases, you may have: ? Imaging studies of the colon. These may include X-ray, ultrasound, or CT scan. ? Blood tests. ? A procedure to examine the inside of your colon (colonoscopy). ? More specialized tests to check:  Whether your anal sphincter works well. This is a ring-shaped muscle that controls the closing of the anus.  How well food moves through your colon. ? Tests to measure the nerve signal in your pelvic floor muscles (electromyography). How is this treated? Treatment for chronic constipation depends on the cause. Most often, treatment starts with:  Being more active and getting regular exercise.  Drinking more fluids.  Adding fiber to your diet. Sources of fiber include fruits, vegetables, whole grains, and fiber supplements.  Using medicines such as stool softeners or medicines that increase contractions in your digestive system (pro-motility agents).  Training your pelvic muscles with biofeedback.  Surgery, if there is obstruction. Treatment for secondary chronic constipation depends on the underlying condition. You may need to:  Stop or change some medicines if they cause  constipation.  Use a fiber supplement (bulk laxative) or stool softener.  Use prescription laxative. This works by [employer] into your colon (osmotic laxative). You may also need to see a specialist who treats conditions of the digestive system (gastroenterologist). Follow these instructions at home:   Take over-the-counter and prescription medicines only as told by your health care provider.  If you are taking a laxative, take it as told by your health care provider.  Eat a balanced diet that includes enough fiber. Ask your health care provider to recommend a diet that is right for you.  Drink clear fluids, especially water. Avoid drinking alcohol, caffeine, and soda.  Drink enough fluid to keep your urine pale yellow.  Get some physical activity every day. Ask your health care provider what physical activities are safe for you.  Get colon cancer screenings as told by your health care provider.  Keep all follow-up visits as told by your health care provider. This is important. Contact a health care provider if:  You are having three or fewer bowel movements a week.  Your stools are hard or lumpy.  You notice blood on the toilet paper or in your stool after you have a bowel movement.  You have unexplained weight loss.  You have rectum (rectal) pain.  You have stool leakage.  You experience nausea or vomiting.  Get help right away if:  You have rectal bleeding or you pass blood clots.  You have severe rectal pain.  You have body tissue that pushes out (protrudes) from your anus.  You have severe pain or bloating (distension) in your abdomen.  You have vomiting that you cannot control. Summary  Chronic constipation is a condition in which a person has three or fewer bowel movements a week, for three months or longer.  You may have a higher risk for this condition if you are an older adult, or if you do not drink enough water or get enough physical activity  (are sedentary).  Treatment for this condition depends on the cause. Most treatments for chronic constipation include adding fiber to your diet, drinking more fluids, and getting more physical activity. You may also need to treat any underlying medical conditions or stop or change certain medicines if they cause constipation.  If lifestyle changes do not relieve constipation, your health care provider may recommend taking a laxative. This information is not intended to replace advice given to you by your health care provider. Make sure you discuss any questions you have with your health care provider. Document Released: 06/16/2017 Document Revised: 10/30/2017 Document Reviewed: 08/04/2017 Elsevier Patient Education  2020 [employer].

## 2019-08-29 NOTE — Progress Notes (Addendum)
Internal Medicine Clinic Attending  Case discussed with Dr. Agyei at the time of the visit.  We reviewed the resident's history and exam and pertinent patient test results.  I agree with the assessment, diagnosis, and plan of care documented in the resident's note.    

## 2019-08-30 ENCOUNTER — Encounter: Payer: Self-pay | Admitting: Podiatry

## 2019-08-30 ENCOUNTER — Ambulatory Visit: Payer: Medicaid Other | Admitting: Podiatry

## 2019-08-30 ENCOUNTER — Other Ambulatory Visit: Payer: Self-pay

## 2019-08-30 DIAGNOSIS — M216X2 Other acquired deformities of left foot: Secondary | ICD-10-CM

## 2019-08-30 DIAGNOSIS — G8929 Other chronic pain: Secondary | ICD-10-CM

## 2019-08-30 DIAGNOSIS — M549 Dorsalgia, unspecified: Secondary | ICD-10-CM

## 2019-08-31 ENCOUNTER — Telehealth: Payer: Self-pay | Admitting: *Deleted

## 2019-08-31 ENCOUNTER — Encounter: Payer: Self-pay | Admitting: Podiatry

## 2019-08-31 NOTE — Telephone Encounter (Signed)
Original referral 06/23/2019 by Dr. Milinda Pointer CMA. Follow up referral on required form, clinicals, and demographics faxed to [facility] as URGENT.

## 2019-08-31 NOTE — Progress Notes (Signed)
She presents today for follow-up of her left lower extremity swelling.  States that is swelling up around the foot and burning a lot the right when the swelling also.  She states that she did go to [facility] but does not know the results of the MRI yet.  Objective: Vital signs are stable alert and oriented x3.  Pulses are palpable.  Moderate edema left lower extremity but the majority of her symptoms are noted to be lateral not at the surgical site left.  Assessment: Edema bilateral.  Left greater than right.  Posterior tibial tendon dysfunction left.  Plan: I will her to follow-up with Liliane Channel for set of orthotics at no charge.

## 2019-08-31 NOTE — Telephone Encounter (Signed)
-----   Message from Rip Harbour, [facility] sent at 08/30/2019  2:51 PM EDT ----- Regarding: NeuroSurgery Patient states she never heard about the [facility] referral - please follow up ASAP per Dr. Milinda Pointer.

## 2019-09-06 ENCOUNTER — Other Ambulatory Visit: Payer: Medicaid Other | Admitting: Orthotics

## 2019-09-15 ENCOUNTER — Other Ambulatory Visit: Payer: Self-pay

## 2019-09-15 ENCOUNTER — Ambulatory Visit: Payer: Medicaid Other | Admitting: Orthotics

## 2019-09-15 DIAGNOSIS — M76822 Posterior tibial tendinitis, left leg: Secondary | ICD-10-CM

## 2019-09-15 DIAGNOSIS — G8929 Other chronic pain: Secondary | ICD-10-CM

## 2019-09-15 DIAGNOSIS — M7752 Other enthesopathy of left foot: Secondary | ICD-10-CM

## 2019-09-15 NOTE — Progress Notes (Signed)
Patient is being seen today for f/o to address congential pes planus/pes planovalgus. Patient is active youth and demonstrates over pronation in gait, prominent medially shifted talus, and collapse of medial column.  Goals are RF stability, longitudinal arch support, decrease in pronation, and ease of discomfort in mobility related activities.   

## 2019-09-21 ENCOUNTER — Other Ambulatory Visit (INDEPENDENT_AMBULATORY_CARE_PROVIDER_SITE_OTHER): Payer: Medicaid Other

## 2019-09-21 ENCOUNTER — Other Ambulatory Visit: Payer: Self-pay

## 2019-09-21 DIAGNOSIS — Z23 Encounter for immunization: Secondary | ICD-10-CM | POA: Diagnosis not present

## 2019-09-22 LAB — HEMOGLOBIN A1C
Est. average glucose Bld gHb Est-mCnc: 151 mg/dL
Hgb A1c MFr Bld: 6.9 % — ABNORMAL HIGH (ref 4.8–5.6)

## 2019-09-27 ENCOUNTER — Other Ambulatory Visit: Payer: Self-pay | Admitting: Internal Medicine

## 2019-09-27 DIAGNOSIS — E119 Type 2 diabetes mellitus without complications: Secondary | ICD-10-CM

## 2019-10-28 ENCOUNTER — Other Ambulatory Visit: Payer: Self-pay | Admitting: Internal Medicine

## 2019-10-28 DIAGNOSIS — E119 Type 2 diabetes mellitus without complications: Secondary | ICD-10-CM

## 2019-10-31 ENCOUNTER — Other Ambulatory Visit: Payer: Self-pay

## 2019-10-31 NOTE — Telephone Encounter (Signed)
Pt/pharmacy requesting a RX for: Accu-Chek guide test strips #400 For 4 times a day testing. Thank you, SChaplin, RN,BSN

## 2019-11-02 ENCOUNTER — Other Ambulatory Visit: Payer: Self-pay | Admitting: Internal Medicine

## 2019-11-02 DIAGNOSIS — E119 Type 2 diabetes mellitus without complications: Secondary | ICD-10-CM

## 2019-11-02 MED ORDER — ACCU-CHEK GUIDE VI STRP: ORAL_STRIP | 3 refills | Status: DC

## 2019-11-02 MED ORDER — ACCU-CHEK GUIDE VI STRP: ORAL_STRIP | 12 refills | Status: DC

## 2019-11-02 NOTE — Addendum Note (Signed)
Addended by: Resa Miner on: 11/02/2019 02:52 PM   Modules accepted: Orders

## 2019-11-07 ENCOUNTER — Other Ambulatory Visit: Payer: Self-pay | Admitting: *Deleted

## 2019-11-07 DIAGNOSIS — E890 Postprocedural hypothyroidism: Secondary | ICD-10-CM

## 2019-11-07 MED ORDER — LEVOTHYROXINE SODIUM 150 MCG PO TABS: ORAL_TABLET | ORAL | 0 refills | Status: DC

## 2019-11-16 ENCOUNTER — Encounter: Payer: Medicaid Other | Admitting: Internal Medicine

## 2019-12-07 ENCOUNTER — Encounter: Payer: Self-pay | Admitting: Internal Medicine

## 2019-12-07 ENCOUNTER — Ambulatory Visit: Payer: Medicaid Other | Admitting: Internal Medicine

## 2019-12-07 ENCOUNTER — Other Ambulatory Visit: Payer: Self-pay

## 2019-12-07 VITALS — BP 147/81 | HR 89 | Temp 98.3°F | Ht 68.0 in | Wt 220.4 lb

## 2019-12-07 DIAGNOSIS — Z79899 Other long term (current) drug therapy: Secondary | ICD-10-CM

## 2019-12-07 DIAGNOSIS — E119 Type 2 diabetes mellitus without complications: Secondary | ICD-10-CM | POA: Diagnosis not present

## 2019-12-07 DIAGNOSIS — I10 Essential (primary) hypertension: Secondary | ICD-10-CM

## 2019-12-07 DIAGNOSIS — Z7984 Long term (current) use of oral hypoglycemic drugs: Secondary | ICD-10-CM | POA: Diagnosis not present

## 2019-12-07 LAB — POCT GLYCOSYLATED HEMOGLOBIN (HGB A1C): Hemoglobin A1C: 6.5 % — AB (ref 4.0–5.6)

## 2019-12-07 LAB — GLUCOSE, CAPILLARY: Glucose-Capillary: 93 mg/dL (ref 70–99)

## 2019-12-07 MED ORDER — LISINOPRIL 20 MG PO TABS: 40.0000 mg | ORAL_TABLET | Freq: Every day | ORAL | 11 refills | Status: DC

## 2019-12-07 NOTE — Progress Notes (Signed)
   CC: Follow-up hypertension  HPI:  [name] is a 54 y.o. very pleasant woman with medical history listed below presented to follow-up on chronic medical problems.  Please see problem based charting for further details.  Past Medical History:  Diagnosis Date  . Abdominal pain 06/25/2010   CT abd/pelvis 05/2014: Very large amount of stool noted distending the colon. The size consistent with severe constipation/fecal impaction.    . Abnormal cervical Papanicolaou smear 04/17/2017  . Anemia   . Arthritis    lumbar region- radiculopathy  . Cancer [facility]) 2013    Thyroid Ca-thyroidectomy  . Chronic fatigue   . DM (diabetes mellitus) ([place])   . Gait disturbance   . GERD (gastroesophageal reflux disease)   . Headache(784.0)   . Herniated lumbar disc without myelopathy 08/20/2016  . History of osteoporosis 05/25/2007   DEXA 2014 Since the baseline examination in 2002, there has been a  statistically significant 19.5% increase in bone mineral density in  the lumbar spine and a statistically significant 14.6% increase in  bone mineral density in the left femur.   DEXA 2002 with osteoporosis of lumbar spine and moderate osteopenia of left hip MRI 2002 left hip negative for avascular necrosis  Qualifier: Diagnosis o  . HTN (hypertension)   . Hypothyroidism   . Insomnia 01/02/2015  . Insomnia 01/02/2015  . Intermittent left-sided chest pain 01/26/2013  . Left sided numbness 12/10/2018  . Nephrolithiasis   . Obesity   . OSA (obstructive sleep apnea)    last test- many yrs. ago, done at [facility], no CPAP in use  . Osteoporosis   . Pain and swelling of lower leg, left 12/16/2017  . Peripheral neuropathy   . Prolonged QT interval   . 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  Cardiovascular: Normal rate, regular rhythm and normal heart sounds.  No murmur heard. Pulmonary/Chest: Effort normal. No respiratory distress. She has no wheezes. She has no rales.  Psychiatric: Mood and affect normal.    Assessment & Plan:   See Encounters Tab for problem based charting.  Patient discussed with Dr. Evette Doffing

## 2019-12-07 NOTE — Assessment & Plan Note (Addendum)
Type 2 diabetes mellitus: Well-controlled.  Home CBGs have ranged 80s-90s with the highest being in the 130s.  She states she has been taking care of herself and eats a healthy diet.  Unfortunately she does not have her glucometer with her today.  Last A1c 6.9% in October from 10.1% prior. A1C today 6.5%  Plan: -Continue Metformin 1000 mg twice daily -Continue Victoza 1.2 mg daily

## 2019-12-07 NOTE — Assessment & Plan Note (Signed)
Hypertension: Not at goal.  BP Readings from Last 3 Encounters:  12/07/19 (!) 147/81  08/24/19 139/87  08/02/19 (!) 141/94    Plan: -Increase lisinopril from 20 mg daily to 40 mg daily -Continue Coreg 3.125 mg twice daily (Also treating for HFmrEF)

## 2019-12-07 NOTE — Patient Instructions (Signed)
Ms. Demarchi,   It was a pleasure taking care of you here in the clinic today.  Here my recommendations after our visit.  1.  For your blood pressure, I am increasing lisinopril from 20 mg a day to 40 mg a day.  2.  For the diabetes, please continue doing the excellent job you have been doing.  I am checking your A1c today.  Take Care! Dr. Eileen Stanford  Please call the internal medicine center clinic if you have any questions or concerns, we may be able to help and keep you from a long and expensive emergency room wait. Our clinic and after hours phone number is [phone], the best time to call is Monday through Friday 9 am to 4 pm but there is always someone available 24/7 if you have an emergency. If you need medication refills please notify your pharmacy one week in advance and they will send [name] a request.

## 2019-12-08 NOTE — Progress Notes (Signed)
Internal Medicine Clinic Attending  Case discussed with Dr. Agyei at the time of the visit.  We reviewed the resident's history and exam and pertinent patient test results.  I agree with the assessment, diagnosis, and plan of care documented in the resident's note.    

## 2019-12-10 ENCOUNTER — Other Ambulatory Visit: Payer: Self-pay | Admitting: Internal Medicine

## 2019-12-15 NOTE — Progress Notes (Signed)
HPI: FU CP; also with h/osarcoidosis, diabetes, and previously diagnosed long QT. Nuclear study January 2020 showed ejection fraction 43% and no ischemia or infarction.  Echocardiogram January 2020 showed ejection fraction 45 to 33% and mild diastolic dysfunction.  Since last seen she denies dyspnea, chest pain, palpitations or syncope.  Current Outpatient Medications  Medication Sig Dispense Refill  . aspirin 81 MG chewable tablet Chew 81 mg by mouth daily.    Marland Kitchen atorvastatin (LIPITOR) 20 MG tablet TAKE 1 TABLET BY MOUTH EVERY DAY IN THE EVENING 90 tablet 1  . blood glucose meter kit and supplies KIT Dispense based on patient and insurance preference. Use up to four times daily as directed. (FOR ICD-9 250.00, 250.01). 1 each 0  . carvedilol (COREG) 3.125 MG tablet Take 1 tablet (3.125 mg total) by mouth 2 (two) times daily. 180 tablet 3  . fluticasone (FLONASE) 50 MCG/ACT nasal spray PLACE 1 SPRAY INTO BOTH NOSTRILS DAILY. 16 g 2  . folic acid (FOLVITE) 1 MG tablet TAKE 1 TABLET BY MOUTH EVERY DAY 90 tablet 3  . glucose blood (ACCU-CHEK GUIDE) test strip Use as instructed to check blood sugar up to 1 time a day 100 each 3  . Insulin Pen Needle (B-D ULTRAFINE III SHORT PEN) 31G X 8 MM MISC Use as directed. 100 each 3  . levothyroxine (SYNTHROID) 150 MCG tablet TAKE 1 TABLET BY MOUTH EVERY DAY BEFORE BREAKFAST 90 tablet 0  . lisinopril (ZESTRIL) 20 MG tablet Take 2 tablets (40 mg total) by mouth daily. 60 tablet 11  . metFORMIN (GLUCOPHAGE) 1000 MG tablet TAKE 1 TABLET BY MOUTH TWICE A DAY WITH MEALS 180 tablet 1  . methotrexate (RHEUMATREX) 2.5 MG tablet Take 3 tablets (7.5 mg total) by mouth once a week. Caution:Chemotherapy. Protect from light. 36 tablet 1  . Multiple Vitamin (MULTIVITAMIN WITH MINERALS) TABS tablet Take 1 tablet by mouth every evening.     Marland Kitchen NARCAN 4 MG/0.1ML LIQD nasal spray kit PLEASE SEE ATTACHED FOR DETAILED DIRECTIONS    . oxyCODONE-acetaminophen (PERCOCET) 10-325  MG tablet Take 1 tablet by mouth every 4 (four) hours as needed for pain.    . pantoprazole (PROTONIX) 20 MG tablet TAKE 1 TABLET BY MOUTH EVERY DAY 90 tablet 1  . VICTOZA 18 MG/3ML SOPN INJECT 0.1 MLS INTO THE SKIN DAILY.0.6 MG ONCE A DAY-1 WEEK THEN INCREASE TO 1.2 MG DAY AFTERWARDS. 3 mL 3   No current facility-administered medications for this visit.     Past Medical History:  Diagnosis Date  . Abdominal pain 06/25/2010   CT abd/pelvis 05/2014: Very large amount of stool noted distending the colon. The size consistent with severe constipation/fecal impaction.    . Abnormal cervical Papanicolaou smear 04/17/2017  . Anemia   . Arthritis    lumbar region- radiculopathy  . Cancer [facility]) 2013    Thyroid Ca-thyroidectomy  . Chronic fatigue   . DM (diabetes mellitus) ([place])   . Gait disturbance   . GERD (gastroesophageal reflux disease)   . Headache(784.0)   . Herniated lumbar disc without myelopathy 08/20/2016  . History of osteoporosis 05/25/2007   DEXA 2014 Since the baseline examination in 2002, there has been a  statistically significant 19.5% increase in bone mineral density in  the lumbar spine and a statistically significant 14.6% increase in  bone mineral density in the left femur.   DEXA 2002 with osteoporosis of lumbar spine and moderate osteopenia of left hip MRI 2002 left  hip negative for avascular necrosis  Qualifier: Diagnosis o  . HTN (hypertension)   . Hypothyroidism   . Insomnia 01/02/2015  . Insomnia 01/02/2015  . Intermittent left-sided chest pain 01/26/2013  . Left sided numbness 12/10/2018  . Nephrolithiasis   . Obesity   . OSA (obstructive sleep apnea)    last test- many yrs. ago, done at [facility], no CPAP in use  . Osteoporosis   . Pain and swelling of lower leg, left 12/16/2017  . Peripheral neuropathy   . Prolonged QT interval   . 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 BRAIN SURGERY  2001    for sarcoidosis  . BREAST BIOPSY Left   . BREAST BIOPSY Right   . BREAST EXCISIONAL BIOPSY Right    x2 benign  . BREAST EXCISIONAL BIOPSY Left    benign  . BREAST SURGERY Bilateral    biopsies   . DILATION AND CURETTAGE OF UTERUS    . HYSTEROSCOPY    . LUMBAR LAMINECTOMY/DECOMPRESSION MICRODISCECTOMY Left 08/20/2016   Procedure: Left Lumbar four-five Microdiskectomy;  Surgeon: Leeroy Cha, MD;  Location: [place] [facility];  Service: Neurosurgery;  Laterality: Left;  . PARTIAL HYSTERECTOMY  2013  . ROTATOR CUFF REPAIR Right    07/2013  . Suboccipital craniotomy    . THYROIDECTOMY     Precancerous lesion    Social History   Socioeconomic History  . Marital status: Married    Spouse name: Not on file  . Number of children: 4  . Years of education: 59  . Highest education level: Not on file  Occupational History    Employer: UNEMPLOYED  Tobacco Use  . Smoking status: Never Smoker  . Smokeless tobacco: Never Used  Substance and Sexual Activity  . Alcohol use: No    Alcohol/week: 0.0 standard drinks  . Drug use: No  . Sexual activity: Not on file    Comment: hysterectomy  Other Topics Concern  . Not on file  Social History Narrative   Lives in [place] with 2 sons and her husband;disabled; no use of tobacco products nor alcohol.   Patient given diabetes card on 12/05/2010.      Patient drinks 2 glasses of tea daily.   Patient is right handed.    Social Determinants of Health   Financial Resource Strain:   . Difficulty of Paying Living Expenses: Not on file  Food Insecurity:   . Worried About Charity fundraiser in the Last Year: Not on file  . Ran Out of Food in the Last Year: Not on file  Transportation Needs:   . Lack of Transportation (Medical): Not on file  . Lack of Transportation (Non-Medical): Not on file  Physical Activity:   . Days of Exercise per Week: Not on file  . Minutes of Exercise per Session: Not on file  Stress:   . Feeling of Stress : Not on  file  Social Connections:   . Frequency of Communication with Friends and Family: Not on file  . Frequency of Social Gatherings with Friends and Family: Not on file  . Attends Religious Services: Not on file  . Active Member of Clubs or Organizations: Not on file  . Attends Archivist Meetings: Not on file  . Marital Status: Not on file  Intimate Partner Violence:   . Fear of Current or Ex-Partner: Not on file  . Emotionally Abused: Not on  file  . Physically Abused: Not on file  . Sexually Abused: Not on file    Family History  Problem Relation Age of Onset  . Heart disease Mother        questionable CAD and arrythmias   . Cancer Mother        Breast cancer  . Breast cancer Mother 79  . Venous thrombosis Sister   . Diabetes Sister   . Heart disease Sister   . Diabetes Brother   . Heart disease Brother   . Cancer Maternal Aunt   . Breast cancer Maternal Aunt   . Venous thrombosis Sister   . Diabetes Sister   . Heart disease Sister     ROS: no fevers or chills, productive cough, hemoptysis, dysphasia, odynophagia, melena, hematochezia, dysuria, hematuria, rash, seizure activity, orthopnea, PND, pedal edema, claudication. Remaining systems are negative.  Physical Exam: Well-developed well-nourished in no acute distress.  Skin is warm and dry.  HEENT is normal.  Neck is supple.  Chest is clear to auscultation with normal expansion.  Cardiovascular exam is regular rate and rhythm.  Abdominal exam nontender or distended. No masses palpated. Extremities show no edema. neuro grossly intact  ECG-normal sinus rhythm at a rate of 92, nonspecific ST changes.  Personally reviewed  A/P  1 cardiomyopathy-etiology unclear.  LV function is mildly reduced on most recent echocardiogram.  Question contribution from sarcoid.  Continue ARB and beta-blocker (increase carvedilol to 6.25 mg twice daily).  I will arrange a cardiac MRI to reassess LV function and to exclude cardiac  sarcoid.  2 chest pain-no recent symptoms and previous nuclear study negative.  3 hypertension-blood pressure mildly elevated; increase carvedilol to 6.25 mg twice daily and follow.  4 hyperlipidemia-continue statin.  5 history of long QT-no history of syncope.  Kirk Ruths, MD

## 2019-12-21 ENCOUNTER — Encounter: Payer: Self-pay | Admitting: Cardiology

## 2019-12-21 ENCOUNTER — Encounter (INDEPENDENT_AMBULATORY_CARE_PROVIDER_SITE_OTHER): Payer: Self-pay

## 2019-12-21 ENCOUNTER — Other Ambulatory Visit: Payer: Self-pay

## 2019-12-21 ENCOUNTER — Ambulatory Visit: Payer: Medicaid Other | Admitting: Cardiology

## 2019-12-21 VITALS — BP 142/78 | HR 92 | Temp 96.8°F | Ht 68.0 in | Wt 218.0 lb

## 2019-12-21 DIAGNOSIS — I420 Dilated cardiomyopathy: Secondary | ICD-10-CM

## 2019-12-21 DIAGNOSIS — E7800 Pure hypercholesterolemia, unspecified: Secondary | ICD-10-CM | POA: Diagnosis not present

## 2019-12-21 DIAGNOSIS — I10 Essential (primary) hypertension: Secondary | ICD-10-CM

## 2019-12-21 MED ORDER — DIAZEPAM 5 MG PO TABS: ORAL_TABLET | ORAL | 0 refills | Status: DC

## 2019-12-21 MED ORDER — CARVEDILOL 6.25 MG PO TABS: 6.2500 mg | ORAL_TABLET | Freq: Two times a day (BID) | ORAL | 3 refills | Status: DC

## 2019-12-21 NOTE — Patient Instructions (Signed)
Medication Instructions:  INCREASE CARVEDILOL TO 6.25 MG TWICE DAILY= 2 OF THE 3.125 MG TABLETS TWICE DAILY  VALIUM 5 MG TAKE ONE TABLET 1 HOUR PRIOR TO MRI  *If you need a refill on your cardiac medications before your next appointment, please call your pharmacy*  Lab Work: If you have labs (blood work) drawn today and your tests are completely normal, you will receive your results only by: Marland Kitchen MyChart Message (if you have MyChart) OR . A paper copy in the mail If you have any lab test that is abnormal or we need to change your treatment, we will call you to review the results.  Testing/Procedures: CARDIAC MRI AT [place]: At [facility], you and your health needs are our priority.  As part of our continuing mission to provide you with exceptional heart care, we have created designated Provider Care Teams.  These Care Teams include your primary Cardiologist (physician) and Advanced Practice Providers (APPs -  Physician Assistants and Nurse Practitioners) who all work together to provide you with the care you need, when you need it.  Your next appointment:   12 month(s)  The format for your next appointment:   Either In Person or Virtual  Provider:   You may see Kirk Ruths MD or one of the following Advanced Practice Providers on your designated Care Team:    Kerin Ransom, PA-C  [place], [place]  Coletta Memos, [place]

## 2019-12-26 ENCOUNTER — Other Ambulatory Visit: Payer: Self-pay | Admitting: Internal Medicine

## 2019-12-26 DIAGNOSIS — I10 Essential (primary) hypertension: Secondary | ICD-10-CM

## 2020-01-01 ENCOUNTER — Other Ambulatory Visit: Payer: Self-pay | Admitting: Internal Medicine

## 2020-01-04 ENCOUNTER — Telehealth: Payer: Self-pay | Admitting: Cardiology

## 2020-01-04 NOTE — Telephone Encounter (Signed)
Returned call to patient she stated she has noticed since she increased Coreg to 6.25 mg twice a day she is sob.Stated feels like she cannot get a good breath.Stated she never received a call with cardiac mri appointment.Advised I will send message to [facility] for advice.

## 2020-01-04 NOTE — Telephone Encounter (Signed)
Continue carvedilol at 6.25 mg twice daily as hopefully dyspnea will improve.  Schedule cardiac MRI as outlined in my note. Kirk Ruths

## 2020-01-04 NOTE — Telephone Encounter (Signed)
Pt c/o Shortness Of Breath: STAT if SOB developed within the last 24 hours or pt is noticeably SOB on the phone  1. Are you currently SOB (can you hear that pt is SOB on the phone)? Yes, could not hear over phone.  2. How long have you been experiencing SOB? Past couple of days   3. Are you SOB when sitting or when up moving around? Both   4. Are you currently experiencing any other symptoms? No  Patient is calling stating she has been experiencing SOB for the past couple of days. She states it comes and goes and she does not have any other symptoms. When she previously had this issue Dr. Stanford Breed prescribed her carvedilol (COREG) 6.25 MG tablet & it went away. Patient is wanting to know if some adjustments need to be made to this medication to get it under control again. Please advise.

## 2020-01-05 NOTE — Telephone Encounter (Signed)
Left message for patient with Dr Creshaw's recommendations.   

## 2020-01-06 ENCOUNTER — Other Ambulatory Visit: Payer: Self-pay | Admitting: *Deleted

## 2020-01-06 DIAGNOSIS — I10 Essential (primary) hypertension: Secondary | ICD-10-CM

## 2020-01-09 ENCOUNTER — Encounter: Payer: Self-pay | Admitting: Cardiology

## 2020-01-09 ENCOUNTER — Telehealth: Payer: Self-pay | Admitting: Cardiology

## 2020-01-09 NOTE — Telephone Encounter (Signed)
Left message regarding cardiac mri scheduled 01/27/20 at 9:00am at [facility]--arrival time 8:15 am 1st floor admissions office---will mail letter to patient and information is also in My Chart---Pt instructed to have lab work 1 week prior to MRI--she was given hours of operation as well.

## 2020-01-10 ENCOUNTER — Other Ambulatory Visit: Payer: Self-pay | Admitting: Internal Medicine

## 2020-01-10 DIAGNOSIS — E119 Type 2 diabetes mellitus without complications: Secondary | ICD-10-CM

## 2020-01-14 ENCOUNTER — Other Ambulatory Visit: Payer: Self-pay | Admitting: Neurology

## 2020-01-16 ENCOUNTER — Telehealth: Payer: Self-pay

## 2020-01-16 NOTE — Telephone Encounter (Signed)
If pt calls back please schedule him with Butler Denmark NP per Dr. Jannifer Franklin advice. We did refill on methotrexate and need to see her to monitor the medication.  VM was left for pt to call back to schedule with Judson Roch NP for Dr Jannifer Franklin.

## 2020-01-18 ENCOUNTER — Other Ambulatory Visit: Payer: Self-pay

## 2020-01-18 DIAGNOSIS — I10 Essential (primary) hypertension: Secondary | ICD-10-CM

## 2020-01-18 LAB — BASIC METABOLIC PANEL
BUN/Creatinine Ratio: 18 (ref 9–23)
BUN: 13 mg/dL (ref 6–24)
CO2: 24 mmol/L (ref 20–29)
Calcium: 9.2 mg/dL (ref 8.7–10.2)
Chloride: 101 mmol/L (ref 96–106)
Creatinine, Ser: 0.73 mg/dL (ref 0.57–1.00)
GFR calc Af Amer: 110 mL/min/{1.73_m2} (ref >59)
GFR calc non Af Amer: 95 mL/min/{1.73_m2} (ref >59)
Glucose: 174 mg/dL — ABNORMAL HIGH (ref 65–99)
Potassium: 4.3 mmol/L (ref 3.5–5.2)
Sodium: 138 mmol/L (ref 134–144)

## 2020-01-24 NOTE — Telephone Encounter (Signed)
Pt called back, she has accepted a my chart vv for 02-25

## 2020-01-26 ENCOUNTER — Telehealth: Payer: Self-pay

## 2020-01-26 ENCOUNTER — Telehealth (HOSPITAL_COMMUNITY): Payer: Self-pay | Admitting: Emergency Medicine

## 2020-01-26 ENCOUNTER — Telehealth: Payer: Medicaid Other | Admitting: Neurology

## 2020-01-26 ENCOUNTER — Encounter (HOSPITAL_COMMUNITY): Payer: Self-pay

## 2020-01-26 NOTE — Progress Notes (Deleted)
Virtual Visit via Video Note  I connected with [name] [name] on 01/26/20 at  1:45 PM EST by a video enabled telemedicine application and verified that I am speaking with the correct person using two identifiers.  Location: Patient: *** Provider: ***   I discussed the limitations of evaluation and management by telemedicine and the availability of in person appointments. The patient expressed understanding and agreed to proceed.  History of Present Illness: 01/26/2020 SS: [name] [name] is a 54 year old female with history of neurosarcoidosis and chronic headache.  She takes methotrexate.  MRI of the brain in February 2020 showed no active disease.  At her last visit, methotrexate was decreased to 5 mg once a week.  08/02/2019 Dr. Jannifer [name]: [name] [name] is a 54 year old right-handed white female with a history of neurosarcoidosis and history of chronic headache.  The patient has been on methotrexate taking 10 mg once a week.  She had MRI of the brain done in February 2020 that showed no active disease.  The patient has had a lot of other issues, she has left shoulder pain associated with a rotator cuff tear, she has been seen through orthopedic surgery for this and she was told that she needed surgery but she wishes to postpone this.  She also has significant left leg pain and sciatica down the left leg, she is seen through a pain center.  She will be having a myelogram with CT to follow-up in the near future.  She also has had prior left foot surgery and has significant swelling and pain in the foot, her podiatry physician has not identified the source of the pain.  She also has significant issues with constipation, she has stomach cramps, difficulty passing stools.  She is followed by Dr. Benson [name] for this.  The patient comes back to this office for further evaluation.   Observations/Objective:   Assessment and Plan:   Follow Up Instructions:    I discussed the assessment and treatment plan with the  patient. The patient was provided an opportunity to ask questions and all were answered. The patient agreed with the plan and demonstrated an understanding of the instructions.   The patient was advised to call back or seek an in-person evaluation if the symptoms worsen or if the condition fails to improve as anticipated.  I provided *** minutes of non-face-to-face time during this encounter.   Suzzanne Cloud, NP

## 2020-01-26 NOTE — Telephone Encounter (Signed)
Patient was a no show for their virtual visit today.  

## 2020-01-26 NOTE — Telephone Encounter (Signed)
Reaching out to patient to offer assistance regarding upcoming cardiac imaging study; pt verbalizes understanding of appt date/time, parking situation and where to check in, pre-test NPO status and medications ordered, and verified current allergies; name and call back number provided for further questions should they arise Marchia Bond RN Navigator Cardiac Imaging [facility] [phone] office [phone] cell  Pt reminded about valium for claustrophobia if needed  Pt denies diabetes supplies like insulin pumps or meters

## 2020-01-27 ENCOUNTER — Other Ambulatory Visit: Payer: Self-pay

## 2020-01-27 ENCOUNTER — Ambulatory Visit (HOSPITAL_COMMUNITY)
Admission: RE | Admit: 2020-01-27 | Discharge: 2020-01-27 | Disposition: A | Discharge status: Home / Self Care | Payer: Medicaid Other | Source: Ambulatory Visit | Attending: Cardiology | Admitting: Cardiology

## 2020-01-27 DIAGNOSIS — I420 Dilated cardiomyopathy: Secondary | ICD-10-CM | POA: Insufficient documentation

## 2020-01-27 LAB — POCT I-STAT CREATININE: Creatinine, Ser: 0.7 mg/dL (ref 0.44–1.00)

## 2020-01-27 MED ORDER — GADOBUTROL 1 MMOL/ML IV SOLN
12.0000 mL | Freq: Once | INTRAVENOUS | Status: AC | PRN
  Administered 2020-01-27: 12 mL via INTRAVENOUS

## 2020-02-15 ENCOUNTER — Encounter: Payer: Self-pay | Admitting: Internal Medicine

## 2020-02-15 ENCOUNTER — Ambulatory Visit: Payer: Medicaid Other | Admitting: Internal Medicine

## 2020-02-15 VITALS — BP 142/91 | HR 97 | Temp 98.7°F | Ht 68.0 in | Wt 221.0 lb

## 2020-02-15 DIAGNOSIS — E119 Type 2 diabetes mellitus without complications: Secondary | ICD-10-CM | POA: Diagnosis not present

## 2020-02-15 DIAGNOSIS — Z79890 Hormone replacement therapy (postmenopausal): Secondary | ICD-10-CM

## 2020-02-15 DIAGNOSIS — I5020 Unspecified systolic (congestive) heart failure: Secondary | ICD-10-CM

## 2020-02-15 DIAGNOSIS — R221 Localized swelling, mass and lump, neck: Secondary | ICD-10-CM | POA: Diagnosis not present

## 2020-02-15 DIAGNOSIS — Z7984 Long term (current) use of oral hypoglycemic drugs: Secondary | ICD-10-CM | POA: Diagnosis not present

## 2020-02-15 DIAGNOSIS — E890 Postprocedural hypothyroidism: Secondary | ICD-10-CM

## 2020-02-15 DIAGNOSIS — E039 Hypothyroidism, unspecified: Secondary | ICD-10-CM

## 2020-02-15 MED ORDER — JARDIANCE 10 MG PO TABS: 10.0000 mg | ORAL_TABLET | Freq: Every day | ORAL | 2 refills | Status: DC

## 2020-02-15 NOTE — Assessment & Plan Note (Addendum)
Asymptomatic. Will check TSH given it has been a year and she is has a neck nodule that we are evaluating as well with a history of thyroid cancer s/p thyroidectomy. - Synthroid 157mcg Daily - TSH  ADDENDUM: TSH suppressed to 0.238. As this is <0.3 will reduce dose by 64mcg and have patient follow up in 6-8 weeks. I attempted to inform patient by phone, but there was no answer. Our office will continue to work to get in touch with her. - Synthroid 139mcg Daily - Follow up in 6-8 weeks for recheck

## 2020-02-15 NOTE — Patient Instructions (Addendum)
Thank you for allowing [name] to care for you  For your neck nodule  - We will check ultrasound of this nodule and of your thyroid  For your hypothyroidism - Recheck thyroid labs (TSH)  For you diabetes - STOP Victoza - START Jardiance 10mg  Daily - Follow up with PCP as scheduled  Follow up as scheduled, you will be contacted with results

## 2020-02-15 NOTE — Assessment & Plan Note (Addendum)
Patient presents for evaluation of R periauricular nodule. She states it has been there for about 1 year, but she noticed it had become larger and tender about 1 week ago. She has had an achy tender pain from if on palpation since that time with intermittent radiation into her neck. She has trace swelling of her right submandibular/neck region with trace loss of definition compared to the left. Will send for Ultrasound of this nodule to delineate cystic versus solid and other features. Will also obtain Ultrasound of her thyroid given her history of thyroid cancer s/p resection (total thyroidectomy on 12/09/2010). Patient is concerned as she feels this pain is similar to when she had thyroid cancer. 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 - Ultrasound Neck (nodule) - Ultrasound Thyroid - TSH

## 2020-02-15 NOTE — Progress Notes (Signed)
   CC: Nodule, Hypothyroidism, Diabetes  HPI:  [name] is a 54 y.o. F with PMHx listed below presenting for Nodule, Hypothyroidism, Diabetes. Please see the A&P for the status of the patient's chronic medical problems.  Past Medical History:  Diagnosis Date  . Abdominal pain 06/25/2010   CT abd/pelvis 05/2014: Very large amount of stool noted distending the colon. The size consistent with severe constipation/fecal impaction.    . Abnormal cervical Papanicolaou smear 04/17/2017  . Anemia   . Arthritis    lumbar region- radiculopathy  . Cancer [facility]) 2013    Thyroid Ca-thyroidectomy  . Chronic fatigue   . DM (diabetes mellitus) ([place])   . Gait disturbance   . GERD (gastroesophageal reflux disease)   . Headache(784.0)   . Herniated lumbar disc without myelopathy 08/20/2016  . History of osteoporosis 05/25/2007   DEXA 2014 Since the baseline examination in 2002, there has been a  statistically significant 19.5% increase in bone mineral density in  the lumbar spine and a statistically significant 14.6% increase in  bone mineral density in the left femur.   DEXA 2002 with osteoporosis of lumbar spine and moderate osteopenia of left hip MRI 2002 left hip negative for avascular necrosis  Qualifier: Diagnosis o  . HTN (hypertension)   . Hypothyroidism   . Insomnia 01/02/2015  . Insomnia 01/02/2015  . Intermittent left-sided chest pain 01/26/2013  . Left sided numbness 12/10/2018  . Nephrolithiasis   . Obesity   . OSA (obstructive sleep apnea)    last test- many yrs. ago, done at [facility], no CPAP in use  . Osteoporosis   . Pain and swelling of lower leg, left 12/16/2017  . Peripheral neuropathy   . Prolonged QT interval   . Sarcoidosis    sarcoidosis in Brain, treated with surgery   Review of Systems:  Performed and all others negative.  Physical Exam:  Vitals:   02/15/20 1430  BP: (!) 142/91  Pulse: 97  Temp: 98.7 F (37.1 C)  TempSrc: Oral  SpO2: 100%  Weight: 221 lb (100.2  kg)  Height: 5\' 8"  (1.727 m)   Physical Exam Constitutional:      General: She is not in acute distress.    Appearance: Normal appearance.  Neck:      Comments: Small (0.5 cm) nodule at inferior right periauricular region. Nodule is mobile and tender. Unclear if lymph node. Trace swelling (mild los of definition) of right anterior neck. Cardiovascular:     Rate and Rhythm: Normal rate and regular rhythm.     Pulses: Normal pulses.     Heart sounds: Normal heart sounds.  Pulmonary:     Effort: Pulmonary effort is normal. No respiratory distress.     Breath sounds: Normal breath sounds.  Abdominal:     General: Bowel sounds are normal. There is no distension.     Palpations: Abdomen is soft.     Tenderness: There is no abdominal tenderness.  Musculoskeletal:        General: No swelling or deformity.  Skin:    General: Skin is warm and dry.  Neurological:     General: No focal deficit present.     Mental Status: Mental status is at baseline.    Assessment & Plan:   See Encounters Tab for problem based charting.  Patient discussed with Dr. Lynnae January

## 2020-02-15 NOTE — Assessment & Plan Note (Signed)
This is well controlled with last A1c of 6.5. However, she has had SOB on Victoza. She is being worked up for [employer] of unclear etiology by cardiology. She tried stopping Victoza and her symptoms improved. She then restarted it with return of her symptoms so she has stopped it again. Will discontinue Victoza (there is also a recommendation against GLP-1 in some people with a history of thyroid cancer, though this appears to be for those with MTC and her cancer was FTC per initial chart review). Given her comorbidity of HFmrEF will start SGLT-2-I. - Metformin 100mg  BID - STOP Victoza - START Jardiance 10mg  Daily - Follow up with PCP as scheduled

## 2020-02-16 ENCOUNTER — Telehealth: Payer: Self-pay | Admitting: Neurology

## 2020-02-16 LAB — TSH: TSH: 0.238 u[IU]/mL — ABNORMAL LOW (ref 0.450–4.500)

## 2020-02-16 NOTE — Telephone Encounter (Signed)
Pt called stating she has a painful bump under her ear on the side of her neck. Pt states it was initially smaller but has now gotten bigger. Pt is concerned as she has had throat cancer and states this is a new symptom

## 2020-02-16 NOTE — Telephone Encounter (Signed)
I called the patient.  The patient has a painful bump under her ear that has grown over the last month, she indicates that the doctor that looked at it said that it could be sarcoid and that she needs to call me.  I believe that the first thing to do is figure out what this entails, she may need a biopsy, if it does appear to be sarcoid then we can address the issue.  The patient is worried that it may be cancer.

## 2020-02-16 NOTE — Progress Notes (Signed)
Internal Medicine Clinic Attending  Case discussed with Dr. Melvin  at the time of the visit.  We reviewed the resident's history and exam and pertinent patient test results.  I agree with the assessment, diagnosis, and plan of care documented in the resident's note.  

## 2020-02-20 ENCOUNTER — Other Ambulatory Visit: Payer: Self-pay | Admitting: Internal Medicine

## 2020-02-20 ENCOUNTER — Telehealth: Payer: Self-pay | Admitting: Internal Medicine

## 2020-02-20 MED ORDER — LEVOTHYROXINE SODIUM 137 MCG PO TABS: ORAL_TABLET | ORAL | 1 refills | Status: DC

## 2020-02-20 NOTE — Telephone Encounter (Signed)
Attempted to contact patient about recent TSH result. TSH was suppressed to 0.238. Her dose of synthroid has been reduce to 117mcg Daily. She will need to follow up in 6-8 weeks for TSH recheck.

## 2020-02-20 NOTE — Addendum Note (Signed)
Addended by: Neva Seat B on: 02/20/2020 09:14 AM   Modules accepted: Orders

## 2020-02-28 ENCOUNTER — Other Ambulatory Visit: Payer: Self-pay | Admitting: Internal Medicine

## 2020-03-19 ENCOUNTER — Encounter (HOSPITAL_COMMUNITY): Payer: Self-pay

## 2020-03-19 ENCOUNTER — Ambulatory Visit (HOSPITAL_COMMUNITY)
Admission: RE | Admit: 2020-03-19 | Discharge: 2020-03-19 | Disposition: A | Discharge status: Home / Self Care | Payer: Medicaid Other | Source: Ambulatory Visit | Attending: Internal Medicine | Admitting: Internal Medicine

## 2020-03-19 ENCOUNTER — Other Ambulatory Visit: Payer: Self-pay

## 2020-03-19 DIAGNOSIS — R221 Localized swelling, mass and lump, neck: Secondary | ICD-10-CM | POA: Diagnosis present

## 2020-03-19 DIAGNOSIS — E890 Postprocedural hypothyroidism: Secondary | ICD-10-CM | POA: Diagnosis not present

## 2020-03-21 ENCOUNTER — Encounter: Payer: Self-pay | Admitting: Internal Medicine

## 2020-03-21 ENCOUNTER — Ambulatory Visit: Payer: Medicaid Other | Admitting: Internal Medicine

## 2020-03-21 VITALS — BP 106/57 | HR 86 | Temp 98.5°F | Ht 68.0 in | Wt 214.7 lb

## 2020-03-21 DIAGNOSIS — Z79899 Other long term (current) drug therapy: Secondary | ICD-10-CM

## 2020-03-21 DIAGNOSIS — R221 Localized swelling, mass and lump, neck: Secondary | ICD-10-CM | POA: Diagnosis not present

## 2020-03-21 DIAGNOSIS — Z7984 Long term (current) use of oral hypoglycemic drugs: Secondary | ICD-10-CM | POA: Diagnosis not present

## 2020-03-21 DIAGNOSIS — I10 Essential (primary) hypertension: Secondary | ICD-10-CM

## 2020-03-21 DIAGNOSIS — E119 Type 2 diabetes mellitus without complications: Secondary | ICD-10-CM

## 2020-03-21 LAB — POCT GLYCOSYLATED HEMOGLOBIN (HGB A1C): Hemoglobin A1C: 8.7 % — AB (ref 4.0–5.6)

## 2020-03-21 LAB — GLUCOSE, CAPILLARY: Glucose-Capillary: 195 mg/dL — ABNORMAL HIGH (ref 70–99)

## 2020-03-21 MED ORDER — TRULICITY 0.75 MG/0.5ML ~~LOC~~ SOAJ: 0.7500 mg | SUBCUTANEOUS | 2 refills | Status: DC

## 2020-03-21 NOTE — Progress Notes (Signed)
   CC: Follow-up type 2 diabetes mellitus  HPI:  [name] is a 54 y.o. pleasant African-American woman with medical history listed below presented to follow-up on chronic medical problems.  Please see problem based charting for further details.  Past Medical History:  Diagnosis Date  . Abdominal pain 06/25/2010   CT abd/pelvis 05/2014: Very large amount of stool noted distending the colon. The size consistent with severe constipation/fecal impaction.    . Abnormal cervical Papanicolaou smear 04/17/2017  . Anemia   . Arthritis    lumbar region- radiculopathy  . Cancer [facility]) 2013    Thyroid Ca-thyroidectomy  . Chronic fatigue   . DM (diabetes mellitus) ([place])   . Gait disturbance   . GERD (gastroesophageal reflux disease)   . Headache(784.0)   . Herniated lumbar disc without myelopathy 08/20/2016  . History of osteoporosis 05/25/2007   DEXA 2014 Since the baseline examination in 2002, there has been a  statistically significant 19.5% increase in bone mineral density in  the lumbar spine and a statistically significant 14.6% increase in  bone mineral density in the left femur.   DEXA 2002 with osteoporosis of lumbar spine and moderate osteopenia of left hip MRI 2002 left hip negative for avascular necrosis  Qualifier: Diagnosis o  . HTN (hypertension)   . Hypothyroidism   . Insomnia 01/02/2015  . Insomnia 01/02/2015  . Intermittent left-sided chest pain 01/26/2013  . Left sided numbness 12/10/2018  . Nephrolithiasis   . Obesity   . OSA (obstructive sleep apnea)    last test- many yrs. ago, done at [facility], no CPAP in use  . Osteoporosis   . Pain and swelling of lower leg, left 12/16/2017  . Peripheral neuropathy   . Prolonged QT interval   . Sarcoidosis    sarcoidosis in Brain, treated with surgery   Review of Systems:  As per HPI  Physical Exam:  Vitals:   03/21/20 1504  BP: (!) 106/57  Pulse: 86  Temp: 98.5 F (36.9 C)  TempSrc: Oral  SpO2: 100%  Weight: 214 lb 11.2  oz (97.4 kg)  Height: 5\' 8"  (1.727 m)   Physical Exam  HENT:  Head:    Cardiovascular: Normal rate and regular rhythm.  No murmur heard. Pulmonary/Chest: Effort normal and breath sounds normal. She has no wheezes.    Assessment & Plan:   See Encounters Tab for problem based charting.  Patient discussed with Dr. Lynnae January

## 2020-03-21 NOTE — Patient Instructions (Signed)
[name] [name],   Thanks for seeing [name] today.   Your A1C today is 8.7%. I will call you to discuss other medication options or if it'll be reasonable to put you back on the Victoza.  As we discussed, since you have not been eating and drinking as much, I will have you hold off taking the lisinopril for a couple days.   Take care!

## 2020-03-21 NOTE — Assessment & Plan Note (Addendum)
Type 2 diabetes mellitus: She was previously on Metformin 1000 mg twice daily and Victoza 1.2 mg daily however upon her last clinic visit she had complained of shortness of breath while on Victoza and also there was concern for GLP-1 agonist being recommended and things patient is with cancer (medullary thyroid carcinoma).  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  She was started from [place] at bedtime.  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 Confirmed with [name] and we will continue to watch her closely, she does expressed understating.  Plan: -Start Trulicity [id number] mg weekly -Continue Metformin 1000 mg twice daily, Jardiance 10mg  daily -Continue checking home blood glucose

## 2020-03-21 NOTE — Assessment & Plan Note (Addendum)
Hypertension: At goal.  She states that she received her second dose of the coronavirus vaccine and has not been feeling well.  Due to this, she has not had optimal p.o. intake.  I have advised that she hold lisinopril for couple days until her appetite improves and she starts eating normally.  Plan: -Hold lisinopril 40 mg for 2 days until appetite returns and p.o. intake is optimal -Continue Coreg 3.125 mg twice daily

## 2020-03-21 NOTE — Assessment & Plan Note (Signed)
Ultrasound thyroid: Surgical changes of thyroidectomy without evidence of residual or recurrent thyroid tissue or lymphadenopathy.  The palpable abnormality in the right preauricular space represents a small subcentimeter lymph node which is very likely reactive in nature.  Her right sided periauricular lymph node is barely palpable today.

## 2020-03-22 NOTE — Progress Notes (Signed)
Internal Medicine Clinic Attending  Case discussed with Dr. Agyei at the time of the visit.  We reviewed the resident's history and exam and pertinent patient test results.  I agree with the assessment, diagnosis, and plan of care documented in the resident's note.    

## 2020-04-07 ENCOUNTER — Other Ambulatory Visit: Payer: Self-pay | Admitting: Internal Medicine

## 2020-04-07 DIAGNOSIS — E119 Type 2 diabetes mellitus without complications: Secondary | ICD-10-CM

## 2020-04-16 DIAGNOSIS — M961 Postlaminectomy syndrome, not elsewhere classified: Secondary | ICD-10-CM | POA: Diagnosis not present

## 2020-04-16 DIAGNOSIS — G8929 Other chronic pain: Secondary | ICD-10-CM | POA: Diagnosis not present

## 2020-04-16 DIAGNOSIS — M25512 Pain in left shoulder: Secondary | ICD-10-CM | POA: Diagnosis not present

## 2020-04-23 ENCOUNTER — Telehealth: Payer: Self-pay | Admitting: Neurology

## 2020-04-23 DIAGNOSIS — D869 Sarcoidosis, unspecified: Secondary | ICD-10-CM

## 2020-04-23 NOTE — Telephone Encounter (Signed)
I called the pt about her having a dry cough and need to be seen for that. I call pt if she has new dry cough to seek her PCP or urgent care. Pt began to change her story and states she does not have covid 20 and has been fully vaccinated and needs to be seen now for the medication Dr. Jannifer Franklin prescribed and neurosarcoidosis in her brain.Pt stated she gets covid 19 test weekly when she sees her other MD. She  is schedule with Dr. Eugenie Birks on June 08/2020. I stated message will be sent to him. PT verbalized understanding.

## 2020-04-23 NOTE — Addendum Note (Signed)
Addended by: Kathrynn Ducking on: 04/23/2020 04:46 PM   Modules accepted: Orders

## 2020-04-23 NOTE — Telephone Encounter (Signed)
I called the patient.  She has had a dry cough for about 2 months, she is concerned about her sarcoidosis.  I will check a chest x-ray, if this is unremarkable she likely needs to see her primary care physician about the cough.

## 2020-04-23 NOTE — Telephone Encounter (Signed)
Pt called to schedule FU apt and for concerns for a new dry cough she has been experiencing. Pt states its not a "covid" cough but rather when she goes outside or breaths in air that irritates her she starts coughing.

## 2020-05-08 ENCOUNTER — Telehealth: Payer: Self-pay | Admitting: *Deleted

## 2020-05-08 NOTE — Telephone Encounter (Signed)
I called pt that per Dr. Jannifer Franklin he wants her to keep the appt tomorrow at 0900 and he was made aware she did not get the chest xray. I stated it can be discuss tomorrow at appt. Pt was made aware that she does not need appt but just walk in and get xray during their business hours.

## 2020-05-08 NOTE — Telephone Encounter (Signed)
Pt called. She said she had not been called about her chest xray yet (was ordered by Dr. Jannifer Franklin). I let her know per Raquel Sarna that she can walk into GI at [address] Pt said she would not be able to go today. She asked if she needs to reschedule her appointment with Dr. Jannifer Franklin for tomorrow or if she can keep it.

## 2020-05-09 ENCOUNTER — Ambulatory Visit
Admission: RE | Admit: 2020-05-09 | Discharge: 2020-05-09 | Disposition: A | Discharge status: Home / Self Care | Payer: Medicaid Other | Source: Ambulatory Visit | Attending: Neurology | Admitting: Neurology

## 2020-05-09 ENCOUNTER — Ambulatory Visit: Payer: Medicaid Other | Admitting: Neurology

## 2020-05-09 ENCOUNTER — Encounter: Payer: Self-pay | Admitting: Neurology

## 2020-05-09 ENCOUNTER — Other Ambulatory Visit: Payer: Self-pay

## 2020-05-09 VITALS — BP 103/70 | HR 99 | Ht 68.0 in | Wt 210.5 lb

## 2020-05-09 DIAGNOSIS — G43019 Migraine without aura, intractable, without status migrainosus: Secondary | ICD-10-CM | POA: Diagnosis not present

## 2020-05-09 DIAGNOSIS — D8689 Sarcoidosis of other sites: Secondary | ICD-10-CM

## 2020-05-09 DIAGNOSIS — Z5181 Encounter for therapeutic drug level monitoring: Secondary | ICD-10-CM | POA: Diagnosis not present

## 2020-05-09 DIAGNOSIS — D869 Sarcoidosis, unspecified: Secondary | ICD-10-CM

## 2020-05-09 MED ORDER — AIMOVIG 140 MG/ML ~~LOC~~ SOAJ: 140.0000 mg | SUBCUTANEOUS | 4 refills | Status: DC

## 2020-05-09 NOTE — Progress Notes (Signed)
Reason for visit: Migraine headache, neurosarcoidosis  [name] [name] is an 54 y.o. 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  We have been able to reduce the methotrexate to 5 mg every week.  Over the last 2 months however, she has developed a dry cough that has been persistent.  She reports a 15 pound weight loss over the last year or so.  Cold air or air that is too hot may initiate coughing.  She gets headaches frequently, she may go an entire week with headaches and then go a few days feeling better.  She averages about 15 days with headache a month.  The patient claims that her blood sugars have been well controlled.  She does have some persistent problems with gait instability, she may fall on occasion.  She reports if she looks up that this will induce dizziness and headache.  She returns to the office today for further evaluation.  She has chronic back pain and left leg pain, she was set up for a CT scan with myelogram of the low back but this was never done.  Past Medical History:  Diagnosis Date  . Abdominal pain 06/25/2010   CT abd/pelvis 05/2014: Very large amount of stool noted distending the colon. The size consistent with severe constipation/fecal impaction.    . Abnormal cervical Papanicolaou smear 04/17/2017  . Anemia   . Arthritis    lumbar region- radiculopathy  . Cancer [facility]) 2013    Thyroid Ca-thyroidectomy  . Chronic fatigue   . DM (diabetes mellitus) ([place])   . Gait disturbance   . GERD (gastroesophageal reflux disease)   . Headache(784.0)   . Herniated lumbar disc without myelopathy 08/20/2016  . History of osteoporosis 05/25/2007   DEXA 2014 Since the baseline examination in 2002, there has been a  statistically significant 19.5% increase in bone mineral density in  the lumbar spine and a statistically significant 14.6% increase in   bone mineral density in the left femur.   DEXA 2002 with osteoporosis of lumbar spine and moderate osteopenia of left hip MRI 2002 left hip negative for avascular necrosis  Qualifier: Diagnosis o  . HTN (hypertension)   . Hypothyroidism   . Insomnia 01/02/2015  . Insomnia 01/02/2015  . Intermittent left-sided chest pain 01/26/2013  . Left sided numbness 12/10/2018  . Nephrolithiasis   . Obesity   . OSA (obstructive sleep apnea)    last test- many yrs. ago, done at [facility], no CPAP in use  . Osteoporosis   . Pain and swelling of lower leg, left 12/16/2017  . Peripheral neuropathy   . Prolonged QT interval   . 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 BRAIN SURGERY  2001   for sarcoidosis  . BREAST BIOPSY Left   . BREAST BIOPSY Right   . BREAST EXCISIONAL BIOPSY Right    x2 benign  . BREAST EXCISIONAL BIOPSY Left    benign  . BREAST SURGERY Bilateral    biopsies   . DILATION AND CURETTAGE OF UTERUS    . HYSTEROSCOPY    . LUMBAR LAMINECTOMY/DECOMPRESSION MICRODISCECTOMY Left 08/20/2016   Procedure: Left Lumbar four-five Microdiskectomy;  Surgeon: Leeroy Cha, MD;  Location: [employer] [facility];  Service: Neurosurgery;  Laterality: Left;  . PARTIAL HYSTERECTOMY  2013  . ROTATOR CUFF REPAIR Right    07/2013  . Suboccipital craniotomy    . THYROIDECTOMY     Precancerous lesion    Family History  Problem Relation Age of Onset  . Heart disease Mother        questionable CAD and arrythmias   . Cancer Mother        Breast cancer  . Breast cancer Mother 73  . Venous thrombosis Sister   . Diabetes Sister   . Heart disease Sister   . Diabetes Brother   . Heart disease Brother   . Cancer Maternal Aunt   . Breast cancer Maternal Aunt   . Venous thrombosis Sister   . Diabetes Sister   . Heart disease Sister     Social history:  reports that she has never smoked. She has never used smokeless tobacco. She reports that  she does not drink alcohol or use drugs.   No Known Allergies  Medications:  Prior to Admission medications   Medication Sig Start Date End Date Taking? Authorizing Provider  aspirin 81 MG chewable tablet Chew 81 mg by mouth daily.   Yes [provider]  atorvastatin (LIPITOR) 20 MG tablet TAKE 1 TABLET BY MOUTH EVERY DAY IN THE EVENING 12/12/19  Yes Agyei, Caprice Kluver, MD  blood glucose meter kit and supplies KIT Dispense based on patient and insurance preference. Use up to four times daily as directed. (FOR ICD-9 250.00, 250.01). 06/15/19  Yes Agyei, Obed K, MD  carvedilol (COREG) 6.25 MG tablet Take 1 tablet (6.25 mg total) by mouth 2 (two) times daily. 12/21/19  Yes Lelon Perla, MD  diazepam (VALIUM) 5 MG tablet Take 1 tablet one hour prior to scan 12/21/19  Yes Crenshaw, Denice Bors, MD  Dulaglutide (TRULICITY) 4.13 KG/4.0NU SOPN Inject 0.75 mg into the skin once a week. 03/21/20 06/19/20 Yes Agyei, Caprice Kluver, MD  DULoxetine (CYMBALTA) 30 MG capsule duloxetine 30 mg capsule,delayed release  TAKE 1 CAPSULE BY MOUTH EVERY DAY   Yes [provider]  empagliflozin (JARDIANCE) 10 MG TABS tablet Take 10 mg by mouth daily before breakfast. 02/15/20  Yes Neva Seat, MD  fluticasone (FLONASE) 50 MCG/ACT nasal spray PLACE 1 SPRAY INTO BOTH NOSTRILS DAILY. 02/28/20 02/27/21 Yes Agyei, Caprice Kluver, MD  folic acid (FOLVITE) 1 MG tablet TAKE 1 TABLET BY MOUTH EVERY DAY 02/20/20  Yes Agyei, Caprice Kluver, MD  glucose blood (ACCU-CHEK GUIDE) test strip Use as instructed to check blood sugar up to 1 time a day 11/02/19  Yes Agyei, Obed K, MD  Insulin Pen Needle (B-D ULTRAFINE III SHORT PEN) 31G X 8 MM MISC Use to inject insulin 1 time daily DIAG CODE E11.9 INSULIN DEPENDENT 01/10/20  Yes Agyei, Caprice Kluver, MD  levothyroxine (SYNTHROID) 137 MCG tablet TAKE 1 TABLET BY MOUTH EVERY DAY BEFORE BREAKFAST 02/20/20  Yes Neva Seat, MD  lisinopril (ZESTRIL) 20 MG tablet Take 2 tablets (40 mg total) by mouth daily.  12/07/19  Yes Agyei, Caprice Kluver, MD  metFORMIN (GLUCOPHAGE) 1000 MG tablet TAKE 1 TABLET BY MOUTH TWICE A DAY WITH MEALS 04/09/20  Yes Agyei, Caprice Kluver, MD  methotrexate (RHEUMATREX) 2.5 MG tablet Take 2 tablets (5 mg total) by mouth once a week. Caution:Chemotherapy. Protect from light. 01/16/20  Yes Kathrynn Ducking, MD  Multiple Vitamin (MULTIVITAMIN WITH MINERALS) TABS tablet Take 1 tablet by mouth every evening.    Yes [provider]  oxyCODONE-acetaminophen (PERCOCET) 10-325 MG tablet Take 1  tablet by mouth every 4 (four) hours as needed for pain.   Yes [provider]  pantoprazole (PROTONIX) 20 MG tablet TAKE 1 TABLET BY MOUTH EVERY DAY 01/02/20  Yes Agyei, Caprice Kluver, MD    ROS:  Out of a complete 14 system review of symptoms, the patient complains only of the following symptoms, and all other reviewed systems are negative.  Back pain, left-sided sciatica Cough Headache, dizziness  Blood pressure 103/70, pulse 99, height _0  (1.727 m), weight 210 lb 8 oz (95.5 kg), last menstrual period 12/03/2011.  Physical Exam  General: The patient is alert and cooperative at the time of the examination.  Respiratory: Lung fields are clear.  Skin: No significant peripheral edema is noted.   Neurologic Exam  Mental status: The patient is alert and oriented x 3 at the time of the examination. The patient has apparent normal recent and remote memory, with an apparently normal attention span and concentration ability.   Cranial nerves: Facial symmetry is present. Speech is normal, no aphasia or dysarthria is noted. Extraocular movements are full. Visual fields are full.  Pupils are equal, round, and reactive to light.  Discs are flat bilaterally.  Motor: The patient has good strength in all 4 extremities.  Sensory examination: Soft touch sensation is symmetric on the face, arms, and legs.  Coordination: The patient has good finger-nose-finger and heel-to-shin bilaterally.  Gait  and station: The patient has a normal gait. Tandem gait unsteady.  Romberg is negative but is unsteady.  Reflexes: Deep tendon reflexes are symmetric.   Assessment/Plan:  1.  Intractable migraine headache  2.  Back pain, left-sided sciatica  3.  Neurosarcoidosis, stable, inactive  The patient will remain on the methotrexate at the current dose, we will check a chest x-ray today given the cough.  If the chest x-ray is clear and shows no findings of sarcoidosis, the patient will go to her primary care physician regarding the cough.  We may be able to continue tapering off of the methotrexate in the future.  Methotrexate can cause pneumonitis and pulmonary fibrosis, but her coughing started with a reduction in the dose of the medication, and she is on a very low-dose at this point.  The patient will be placed on Aimovig for her migraine headache.  She will follow-up here in 6 months.  Blood work will be done today.  Jill Alexanders MD 05/09/2020 9:15 AM  [facility] [address] [place],  [address]  Phone [phone] Fax [phone]

## 2020-05-10 ENCOUNTER — Telehealth: Payer: Self-pay | Admitting: Neurology

## 2020-05-10 LAB — COMPREHENSIVE METABOLIC PANEL
ALT: 15 IU/L (ref 0–32)
AST: 19 IU/L (ref 0–40)
Albumin/Globulin Ratio: 1.5 (ref 1.2–2.2)
Albumin: 4.2 g/dL (ref 3.8–4.9)
Alkaline Phosphatase: 77 IU/L (ref 48–121)
BUN/Creatinine Ratio: 22 (ref 9–23)
BUN: 17 mg/dL (ref 6–24)
Bilirubin Total: <0.2 mg/dL (ref 0.0–1.2)
CO2: 25 mmol/L (ref 20–29)
Calcium: 9.5 mg/dL (ref 8.7–10.2)
Chloride: 102 mmol/L (ref 96–106)
Creatinine, Ser: 0.78 mg/dL (ref 0.57–1.00)
GFR calc Af Amer: 101 mL/min/{1.73_m2} (ref >59)
GFR calc non Af Amer: 88 mL/min/{1.73_m2} (ref >59)
Globulin, Total: 2.8 g/dL (ref 1.5–4.5)
Glucose: 106 mg/dL — ABNORMAL HIGH (ref 65–99)
Potassium: 4.6 mmol/L (ref 3.5–5.2)
Sodium: 141 mmol/L (ref 134–144)
Total Protein: 7 g/dL (ref 6.0–8.5)

## 2020-05-10 LAB — CBC WITH DIFFERENTIAL/PLATELET
Basophils Absolute: 0 10*3/uL (ref 0.0–0.2)
Basos: 1 %
EOS (ABSOLUTE): 0.3 10*3/uL (ref 0.0–0.4)
Eos: 4 %
Hematocrit: 36.7 % (ref 34.0–46.6)
Hemoglobin: 11.8 g/dL (ref 11.1–15.9)
Immature Grans (Abs): 0 10*3/uL (ref 0.0–0.1)
Immature Granulocytes: 0 %
Lymphocytes Absolute: 2.7 10*3/uL (ref 0.7–3.1)
Lymphs: 39 %
MCH: 27 pg (ref 26.6–33.0)
MCHC: 32.2 g/dL (ref 31.5–35.7)
MCV: 84 fL (ref 79–97)
Monocytes Absolute: 0.5 10*3/uL (ref 0.1–0.9)
Monocytes: 7 %
Neutrophils Absolute: 3.5 10*3/uL (ref 1.4–7.0)
Neutrophils: 49 %
Platelets: 348 10*3/uL (ref 150–450)
RBC: 4.37 x10E6/uL (ref 3.77–5.28)
RDW: 14.6 % (ref 11.7–15.4)
WBC: 7 10*3/uL (ref 3.4–10.8)

## 2020-05-10 MED ORDER — METHOTREXATE 2.5 MG PO TABS: 2.5000 mg | ORAL_TABLET | ORAL | 1 refills | Status: DC

## 2020-05-10 NOTE — Telephone Encounter (Signed)
I called the patient.  The chest x-ray appears to be clear, I will try to cut back on the methotrexate to 2.5 mg once a week and eventually maybe get her off the medication completely.  She will see her primary doctor about the chronic cough.   CXR 05/09/20:  IMPRESSION: No acute abnormality seen.

## 2020-05-16 ENCOUNTER — Other Ambulatory Visit: Payer: Self-pay

## 2020-05-16 ENCOUNTER — Encounter: Payer: Self-pay | Admitting: Internal Medicine

## 2020-05-16 ENCOUNTER — Ambulatory Visit (INDEPENDENT_AMBULATORY_CARE_PROVIDER_SITE_OTHER): Payer: Medicaid Other | Admitting: Internal Medicine

## 2020-05-16 DIAGNOSIS — R05 Cough: Secondary | ICD-10-CM

## 2020-05-16 DIAGNOSIS — D869 Sarcoidosis, unspecified: Secondary | ICD-10-CM

## 2020-05-16 DIAGNOSIS — R059 Cough, unspecified: Secondary | ICD-10-CM | POA: Insufficient documentation

## 2020-05-16 MED ORDER — ALBUTEROL SULFATE HFA 108 (90 BASE) MCG/ACT IN AERS: 2.0000 | INHALATION_SPRAY | Freq: Four times a day (QID) | RESPIRATORY_TRACT | 1 refills | Status: DC | PRN

## 2020-05-16 NOTE — Progress Notes (Signed)
[facility] Telephone Encounter Continuity Care Appointment  HPI:   This telephone encounter was created for [name] on 05/16/2020 for the following purpose/cc Cough.  Started 2 months ago. Been about the same, has not worsened or improved. Has tried cough drops that gives her temporary relief before the cough returns.  Sometimes clear mucous comes up, but mostly unproductive   Seems to be worse with temperature changes and at night.  1-2 coughing episodes  every hour to 2 hours. Each lasts 3-5 minutes.   Denies sick contacts. Says she has tested negative for COVID-19 multiple times (last 2-3 weeks ago). Fully vaccinated for 2 months. Pfizer.  No fevers, chest pain, SOB, changes in appetite, normal bowel and bladder habits  Xray on 6/9 ordered by Neurologist which was normal    Past Medical History:  Past Medical History:  Diagnosis Date  . Abdominal pain 06/25/2010   CT abd/pelvis 05/2014: Very large amount of stool noted distending the colon. The size consistent with severe constipation/fecal impaction.    . Abnormal cervical Papanicolaou smear 04/17/2017  . Anemia   . Arthritis    lumbar region- radiculopathy  . Cancer [facility]) 2013    Thyroid Ca-thyroidectomy  . Chronic fatigue   . DM (diabetes mellitus) ([place])   . Gait disturbance   . GERD (gastroesophageal reflux disease)   . Headache(784.0)   . Herniated lumbar disc without myelopathy 08/20/2016  . History of osteoporosis 05/25/2007   DEXA 2014 Since the baseline examination in 2002, there has been a  statistically significant 19.5% increase in bone mineral density in  the lumbar spine and a statistically significant 14.6% increase in  bone mineral density in the left femur.   DEXA 2002 with osteoporosis of lumbar spine and moderate osteopenia of left hip MRI 2002 left hip negative for avascular necrosis  Qualifier: Diagnosis o  . HTN (hypertension)   . Hypothyroidism   . Insomnia  01/02/2015  . Insomnia 01/02/2015  . Intermittent left-sided chest pain 01/26/2013  . Left sided numbness 12/10/2018  . Nephrolithiasis   . Obesity   . OSA (obstructive sleep apnea)    last test- many yrs. ago, done at [facility], no CPAP in use  . Osteoporosis   . Pain and swelling of lower leg, left 12/16/2017  . Peripheral neuropathy   . Prolonged QT interval   . 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   As always, pt is advised that if symptoms worsen or new symptoms arise, they should go to an urgent care facility or to to ER for further evaluation.   Consent and Medical Decision Making:   Patient discussed with Dr. Philipp Ovens  This is a telephone encounter between [name] [name] and Earlene Plater on 05/16/2020 for a cough. The visit was conducted with the patient located at home and Earlene Plater at [facility]. The patient's identity was confirmed using their DOB and current address. The patient has consented to being evaluated through a telephone encounter and understands the associated risks (an examination cannot be done and the patient may need to come in for an appointment) / benefits (allows the patient to remain at home, decreasing exposure to coronavirus). I personally spent 15 minutes on  medical discussion.

## 2020-05-16 NOTE — Assessment & Plan Note (Signed)
Ms. Armentrout has been experiencing a cough for the past 2 months that has not seemed to have worsened or improved.  She tried using cough drops but that is only temporarily given her relief.  Her cough is nonproductive.  She coughs daily every 2 hours and has 1-2 coughing spells that last 3 to 5 minutes.  She has no sick contacts or known Covid exposures.  She is fully vaccinated with the [place] COVID-19 vaccine as of 2 months ago.  She has had no fevers, chest pain, shortness of breath, appetite changes, or changes in her bowel or bladder habits.  Of note, the patient is currently being treated by her neurologist for neurosarcoidosis.  She had a chest x-ray ordered by this provider on 6/9 which did not show any evidence of pulmonary disease.  The patient denies any recent medication changes.  Upon review of the patient's medications, patient is currently taking lisinopril and has been for the past couple years.  Plan: -Stop lisinopril 40 mg daily -Albuterol inhaler prescribed 2 puffs every 6 hours as needed for coughing and SOB -Follow-up in 2 weeks for an in person clinic visit -If cough has not improved, conside asthma or GERD as causes of cough.  May benefit from a trial of PPI or PFTs in the future

## 2020-05-17 NOTE — Progress Notes (Signed)
Internal Medicine Clinic Attending  Case discussed with Dr. Alexander at the time of the visit.  We reviewed the resident's history and exam and pertinent patient test results.  I agree with the assessment, diagnosis, and plan of care documented in the resident's note.  

## 2020-05-21 DIAGNOSIS — K5900 Constipation, unspecified: Secondary | ICD-10-CM | POA: Diagnosis not present

## 2020-05-22 ENCOUNTER — Other Ambulatory Visit: Payer: Self-pay | Admitting: Internal Medicine

## 2020-05-22 DIAGNOSIS — E119 Type 2 diabetes mellitus without complications: Secondary | ICD-10-CM

## 2020-06-01 ENCOUNTER — Other Ambulatory Visit: Payer: Self-pay | Admitting: Internal Medicine

## 2020-06-01 DIAGNOSIS — Z1231 Encounter for screening mammogram for malignant neoplasm of breast: Secondary | ICD-10-CM

## 2020-06-27 ENCOUNTER — Other Ambulatory Visit: Payer: Self-pay | Admitting: Neurology

## 2020-06-27 ENCOUNTER — Other Ambulatory Visit: Payer: Self-pay | Admitting: *Deleted

## 2020-06-27 DIAGNOSIS — K5900 Constipation, unspecified: Secondary | ICD-10-CM | POA: Diagnosis not present

## 2020-06-27 MED ORDER — METHOTREXATE 2.5 MG PO TABS: 2.5000 mg | ORAL_TABLET | ORAL | 1 refills | Status: DC

## 2020-07-04 ENCOUNTER — Other Ambulatory Visit: Payer: Self-pay | Admitting: Internal Medicine

## 2020-07-10 ENCOUNTER — Ambulatory Visit: Payer: Medicaid Other

## 2020-07-11 DIAGNOSIS — K5900 Constipation, unspecified: Secondary | ICD-10-CM | POA: Diagnosis not present

## 2020-07-17 ENCOUNTER — Other Ambulatory Visit: Payer: Self-pay | Admitting: *Deleted

## 2020-07-17 MED ORDER — PANTOPRAZOLE SODIUM 20 MG PO TBEC: 20.0000 mg | DELAYED_RELEASE_TABLET | Freq: Every day | ORAL | 1 refills | Status: DC

## 2020-07-20 ENCOUNTER — Ambulatory Visit
Admission: RE | Admit: 2020-07-20 | Discharge: 2020-07-20 | Disposition: A | Discharge status: Home / Self Care | Payer: Medicaid Other | Source: Ambulatory Visit | Attending: Internal Medicine | Admitting: Internal Medicine

## 2020-07-20 ENCOUNTER — Other Ambulatory Visit: Payer: Self-pay

## 2020-07-20 DIAGNOSIS — Z1231 Encounter for screening mammogram for malignant neoplasm of breast: Secondary | ICD-10-CM | POA: Diagnosis not present

## 2020-07-20 MED ORDER — PANTOPRAZOLE SODIUM 20 MG PO TBEC: 20.0000 mg | DELAYED_RELEASE_TABLET | Freq: Every day | ORAL | 1 refills | Status: DC

## 2020-07-20 NOTE — Telephone Encounter (Signed)
[facility] requesting Rx for protonix be resent by Attending as Provider is not yet registered with [facility]. Hubbard Hartshorn, BSN, RN-BC

## 2020-07-20 NOTE — Addendum Note (Signed)
Addended by: Velora Heckler on: 07/20/2020 02:18 PM   Modules accepted: Orders

## 2020-07-23 ENCOUNTER — Other Ambulatory Visit: Payer: Self-pay | Admitting: Internal Medicine

## 2020-07-23 DIAGNOSIS — E119 Type 2 diabetes mellitus without complications: Secondary | ICD-10-CM

## 2020-07-23 DIAGNOSIS — N816 Rectocele: Secondary | ICD-10-CM

## 2020-07-23 HISTORY — DX: Rectocele: N81.6

## 2020-07-24 DIAGNOSIS — M25512 Pain in left shoulder: Secondary | ICD-10-CM | POA: Diagnosis not present

## 2020-07-24 DIAGNOSIS — G8929 Other chronic pain: Secondary | ICD-10-CM | POA: Diagnosis not present

## 2020-07-24 DIAGNOSIS — M961 Postlaminectomy syndrome, not elsewhere classified: Secondary | ICD-10-CM | POA: Diagnosis not present

## 2020-08-17 ENCOUNTER — Other Ambulatory Visit: Payer: Self-pay | Admitting: Internal Medicine

## 2020-08-17 DIAGNOSIS — E119 Type 2 diabetes mellitus without complications: Secondary | ICD-10-CM

## 2020-08-21 DIAGNOSIS — G8929 Other chronic pain: Secondary | ICD-10-CM | POA: Diagnosis not present

## 2020-08-21 DIAGNOSIS — M961 Postlaminectomy syndrome, not elsewhere classified: Secondary | ICD-10-CM | POA: Diagnosis not present

## 2020-08-21 DIAGNOSIS — M25512 Pain in left shoulder: Secondary | ICD-10-CM | POA: Diagnosis not present

## 2020-08-22 ENCOUNTER — Other Ambulatory Visit: Payer: Self-pay | Admitting: Student

## 2020-08-22 ENCOUNTER — Other Ambulatory Visit: Payer: Self-pay

## 2020-08-22 DIAGNOSIS — E890 Postprocedural hypothyroidism: Secondary | ICD-10-CM

## 2020-08-22 MED ORDER — LEVOTHYROXINE SODIUM 137 MCG PO TABS: ORAL_TABLET | ORAL | 1 refills | Status: DC

## 2020-08-22 NOTE — Telephone Encounter (Signed)
I called and spoke to the patient.  I explained to her that her TSH was low in March and it was recommended that she had a repeat TSH in 6-8 weeks.  I will refill the levothyroxine and patient is advised to make a follow-up appointment as soon as possible for repeat TSH.  Patient voiced understand and will call the office to make her next appointment.  All questions were answered.  Gaylan Gerold DO

## 2020-08-22 NOTE — Progress Notes (Signed)
I called and spoke to the patient.  I explained to her that her TSH was low at 0.238 in March and it was recommended that she had a repeat TSH in 6-8 weeks.  I will refill the levothyroxine and patient is advised to make a follow-up appointment as soon as possible for repeat TSH.  Patient voiced understand and will call the office to make her next appointment.  All questions were answered.  Gaylan Gerold DO

## 2020-08-23 ENCOUNTER — Telehealth: Payer: Self-pay | Admitting: Internal Medicine

## 2020-08-23 DIAGNOSIS — E890 Postprocedural hypothyroidism: Secondary | ICD-10-CM

## 2020-08-23 MED ORDER — LEVOTHYROXINE SODIUM 137 MCG PO TABS: ORAL_TABLET | ORAL | 1 refills | Status: DC

## 2020-08-23 NOTE — Telephone Encounter (Signed)
Pt reporting she is unable to pick up the following medication as the pharmacy states the Resident ordering the he medication is not listed to authorize her medication.   levothyroxine (SYNTHROID) 137 MCG tablet  [id number] [place], [place] - [address] AT [place]

## 2020-08-23 NOTE — Telephone Encounter (Signed)
Refilled

## 2020-08-23 NOTE — Telephone Encounter (Signed)
This RX was sent yesterday by Dr. Alfonse Spruce.  Pt has  medicaid.  Will send to attending pool for assistance in [place] and notify office manager that Dr. Lamont Snowball NPI # not working for [employer]. Thank you, SChaplin, RN,BSN

## 2020-08-27 ENCOUNTER — Other Ambulatory Visit: Payer: Self-pay | Admitting: Neurology

## 2020-08-29 ENCOUNTER — Ambulatory Visit (INDEPENDENT_AMBULATORY_CARE_PROVIDER_SITE_OTHER): Payer: Medicaid Other | Admitting: Student

## 2020-08-29 ENCOUNTER — Encounter: Payer: Self-pay | Admitting: Student

## 2020-08-29 ENCOUNTER — Other Ambulatory Visit: Payer: Self-pay

## 2020-08-29 VITALS — BP 136/86 | HR 87 | Temp 98.8°F | Ht 68.0 in | Wt 207.5 lb

## 2020-08-29 DIAGNOSIS — Z0000 Encounter for general adult medical examination without abnormal findings: Secondary | ICD-10-CM | POA: Diagnosis not present

## 2020-08-29 DIAGNOSIS — K5909 Other constipation: Secondary | ICD-10-CM

## 2020-08-29 DIAGNOSIS — E890 Postprocedural hypothyroidism: Secondary | ICD-10-CM

## 2020-08-29 DIAGNOSIS — K219 Gastro-esophageal reflux disease without esophagitis: Secondary | ICD-10-CM

## 2020-08-29 DIAGNOSIS — I10 Essential (primary) hypertension: Secondary | ICD-10-CM

## 2020-08-29 DIAGNOSIS — Z23 Encounter for immunization: Secondary | ICD-10-CM

## 2020-08-29 DIAGNOSIS — E119 Type 2 diabetes mellitus without complications: Secondary | ICD-10-CM

## 2020-08-29 DIAGNOSIS — I110 Hypertensive heart disease with heart failure: Secondary | ICD-10-CM | POA: Diagnosis not present

## 2020-08-29 DIAGNOSIS — I5020 Unspecified systolic (congestive) heart failure: Secondary | ICD-10-CM

## 2020-08-29 DIAGNOSIS — D8689 Sarcoidosis of other sites: Secondary | ICD-10-CM

## 2020-08-29 LAB — POCT GLYCOSYLATED HEMOGLOBIN (HGB A1C): Hemoglobin A1C: 5.9 % — AB (ref 4.0–5.6)

## 2020-08-29 LAB — GLUCOSE, CAPILLARY: Glucose-Capillary: 94 mg/dL (ref 70–99)

## 2020-08-29 MED ORDER — PANTOPRAZOLE SODIUM 20 MG PO TBEC: 20.0000 mg | DELAYED_RELEASE_TABLET | Freq: Every day | ORAL | 1 refills | Status: DC

## 2020-08-29 NOTE — Assessment & Plan Note (Addendum)
Patient complains of chronic constipation.  She states that her constipation causes her to be bloated and abdominal discomfort which decreases her appetite.  Her weights came down from [phone] today.  Her bowel movements is also painful.  She was sent to [facility] and was diagnosed with rectocele.  Planned surgery in spring due to Covid.  In the meantime patient is refer to [facility] for pelvic floor exercise.  Patient states that she has tried many remedies for constipation including MiraLAX but not successful.  She is currently drinking Tamarin juice and taking a "purple pill" which helps.  She declines adding MiraLAX.  Will obtain records from [place] for more information.  Plan: -Follow-up with [facility] for pelvic floor exercise -Follow-up with [facility] for planned surgery -Obtain records from [place]

## 2020-08-29 NOTE — Assessment & Plan Note (Addendum)
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  Her levothyroxine dosage was decreased to 137 given her TSH of 0.23.  Recheck TSH today and adjust her levothyroxine accordingly.  Plan: -Continue levothyroxine 137 mcg daily -Pending TSH, will adjust the dosage accordingly  Addendum: TSH within normal limits.  We will keep the same dose of levothyroxine.  Repeat TSH at next visit.

## 2020-08-29 NOTE — Assessment & Plan Note (Signed)
Continue methotrexate and folic acid Continue following neurology

## 2020-08-29 NOTE — Assessment & Plan Note (Signed)
Blood pressure 136/86 today.  No change in medications  Plan: -Continue Coreg 6.25 mg twice daily -Continue holding lisinopril

## 2020-08-29 NOTE — Assessment & Plan Note (Addendum)
Repeat lipid panel  Plan: -Continue atorvastatin 20 mg daily

## 2020-08-29 NOTE — Patient Instructions (Signed)
Ms. Rhames,  It is a pleasure getting to know you today.  Here is a summary of we talked about:  1.  Diabetes: Your A1c today is 5.9, which is in good range.  I will not make any change in your medications.  Please check your fasting blood sugar every morning and keep a log.  Call us if you have any low blood sugar less than 70.  2.  Hypothyroid: I will check a TSH today and will adjust her medication accordingly.  Please continue taking levothyroxine 137 mcg at this time.  I will call you with the results  3.  Hypertension: Your blood pressure today is 136/86.  Please continue taking Coreg 6.25 mg twice daily.  4.  Constipation:.  I am so sorry that you have so many issue with constipation.  Please continue to follow-up with [facility] gastroenterology.  Please continue trying your bowel regiment at home.  I will try to get the records from the office.  Please call us for any questions or concerns.  Please come back in 3 months, sooner if needed.  Take care  Dr. Alfonse Spruce

## 2020-08-29 NOTE — Assessment & Plan Note (Addendum)
Up-to-date with colonoscopy Up-to-date with mammogram No Pap smear due to hysterectomy Up-to-date with Covid vaccines.  Patient may be a candidate for booster shot given being on methotrexate for sarcoidosis. Flu shot today

## 2020-08-29 NOTE — Progress Notes (Signed)
   CC: Diabetes and hypothyroidism management  HPI:  [name] [name] is a 54 y.o. with past medical history of type 2 diabetes, hypertension, hypothyroidism, sarcoidosis, dilated cardiomyopathy who presents to clinic for diabetes and hypothyroid management.  Please see problem based charting for further details.  Past Medical History:  Diagnosis Date  . Abdominal pain 06/25/2010   CT abd/pelvis 05/2014: Very large amount of stool noted distending the colon. The size consistent with severe constipation/fecal impaction.    . Abnormal cervical Papanicolaou smear 04/17/2017  . Anemia   . Arthritis    lumbar region- radiculopathy  . Cancer [facility]) 2013    Thyroid Ca-thyroidectomy  . Chronic fatigue   . DM (diabetes mellitus) ([place])   . Gait disturbance   . GERD (gastroesophageal reflux disease)   . Headache(784.0)   . Herniated lumbar disc without myelopathy 08/20/2016  . History of osteoporosis 05/25/2007   DEXA 2014 Since the baseline examination in 2002, there has been a  statistically significant 19.5% increase in bone mineral density in  the lumbar spine and a statistically significant 14.6% increase in  bone mineral density in the left femur.   DEXA 2002 with osteoporosis of lumbar spine and moderate osteopenia of left hip MRI 2002 left hip negative for avascular necrosis  Qualifier: Diagnosis o  . HTN (hypertension)   . Hypothyroidism   . Insomnia 01/02/2015  . Insomnia 01/02/2015  . Intermittent left-sided chest pain 01/26/2013  . Left sided numbness 12/10/2018  . Nephrolithiasis   . Obesity   . OSA (obstructive sleep apnea)    last test- many yrs. ago, done at [facility], no CPAP in use  . Osteoporosis   . Pain and swelling of lower leg, left 12/16/2017  . Peripheral neuropathy   . Prolonged QT interval   . 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    Appearance: Normal appearance.  HENT:     Head: Normocephalic.  Eyes:     General:        Right eye: No discharge.        Left eye: No discharge.  Cardiovascular:     Rate and Rhythm: Normal rate and regular rhythm.     Heart sounds: No murmur heard.   Pulmonary:     Effort: No respiratory distress.     Breath sounds: Normal breath sounds. No wheezing.  Abdominal:     General: There is distension.  Musculoskeletal:     Cervical back: Normal range of motion.     Right lower leg: Edema (Trace) present.     Left lower leg: No edema.     Comments: Surgical scar noted in the medial side of right foot  Skin:    General: Skin is warm.     Coloration: Skin is not jaundiced.  Neurological:     Mental Status: She is alert.  Psychiatric:        Mood and Affect: Mood normal.     Assessment & Plan:   See Encounters Tab for problem based charting.  Patient seen with Dr. Jimmye Norman

## 2020-08-29 NOTE — Assessment & Plan Note (Signed)
A1c 5.7 in April 2021.  A1c 5.9 today.  Patient contributes this to her not eating well.  Patient has longstanding constipation and has been seen by [facility] and was diagnosed with rectocele.  This causes a lot of discomfort and bloating, and decreased appetite.  Patient say that she does not check her blood sugar very often.  She denies any episodes of hypoglycemia.  She denied ulcers, wounds, numbness or tingling sensation of her feet.  I would not make any change to her medications at this time.  Patient is advised to check her fasting blood glucose more frequently and keep a log.  Patient is also advised to give [name] a call if she experience any hypoglycemia.  Referral to ophthalmology placed.  Plan: -Continue Trulicity -Continue Metformin -Continue Jardiance -A1c in 3 months

## 2020-08-30 ENCOUNTER — Other Ambulatory Visit: Payer: Self-pay | Admitting: Student

## 2020-08-30 DIAGNOSIS — E785 Hyperlipidemia, unspecified: Secondary | ICD-10-CM

## 2020-08-30 LAB — SPECIMEN STATUS

## 2020-08-30 LAB — MICROALBUMIN / CREATININE URINE RATIO
Creatinine, Urine: 57.3 mg/dL
Microalb/Creat Ratio: <5 mg/g creat (ref 0–29)
Microalbumin, Urine: <3 ug/mL

## 2020-08-30 LAB — LIPID PANEL
Chol/HDL Ratio: 3.6 ratio (ref 0.0–4.4)
Cholesterol, Total: 181 mg/dL (ref 100–199)
HDL: 50 mg/dL (ref >39)
LDL Chol Calc (NIH): 120 mg/dL — ABNORMAL HIGH (ref 0–99)
Triglycerides: 56 mg/dL (ref 0–149)
VLDL Cholesterol Cal: 11 mg/dL (ref 5–40)

## 2020-08-30 LAB — TSH: TSH: 2.27 u[IU]/mL (ref 0.450–4.500)

## 2020-08-30 MED ORDER — ATORVASTATIN CALCIUM 20 MG PO TABS: 20.0000 mg | ORAL_TABLET | Freq: Every day | ORAL | 3 refills | Status: DC

## 2020-08-30 NOTE — Progress Notes (Signed)
DOS 08/29/20:  Internal Medicine Clinic Attending  I saw and evaluated the patient.  I personally confirmed the key portions of the history and exam documented by Dr. Alfonse Spruce and I reviewed pertinent patient test results.  The assessment, diagnosis, and plan were formulated together and I agree with the documentation in the resident's note.

## 2020-08-31 ENCOUNTER — Other Ambulatory Visit: Payer: Self-pay | Admitting: Internal Medicine

## 2020-08-31 NOTE — Telephone Encounter (Signed)
RTC, patient states she cannot pick up her RX's, [facility] Lamont Snowball number will not go through. [facility], spoke to pharmacist, Adonis Huguenin and gave her the attending's NPI number who also saw the patient with Dr. Alfonse Spruce (Dr. Jimmye Norman) for the atorvastatin 20mg  daily RX and the Pantoprazole 20mg  daily RX. SChaplin, RN,BSN

## 2020-08-31 NOTE — Telephone Encounter (Signed)
Pt is having problem getting medicine, pls contact [phone]   [id number] [place], [place] - [address] AT [place]

## 2020-09-03 ENCOUNTER — Other Ambulatory Visit: Payer: Self-pay | Admitting: Anesthesiology

## 2020-09-03 ENCOUNTER — Other Ambulatory Visit: Payer: Self-pay

## 2020-09-03 ENCOUNTER — Ambulatory Visit
Admission: RE | Admit: 2020-09-03 | Discharge: 2020-09-03 | Disposition: A | Discharge status: Home / Self Care | Payer: Medicaid Other | Source: Ambulatory Visit | Attending: Anesthesiology | Admitting: Anesthesiology

## 2020-09-03 DIAGNOSIS — M545 Low back pain, unspecified: Secondary | ICD-10-CM

## 2020-09-03 DIAGNOSIS — M47816 Spondylosis without myelopathy or radiculopathy, lumbar region: Secondary | ICD-10-CM | POA: Diagnosis not present

## 2020-09-18 DIAGNOSIS — M5136 Other intervertebral disc degeneration, lumbar region: Secondary | ICD-10-CM | POA: Diagnosis not present

## 2020-09-18 DIAGNOSIS — M961 Postlaminectomy syndrome, not elsewhere classified: Secondary | ICD-10-CM | POA: Diagnosis not present

## 2020-09-18 DIAGNOSIS — G8929 Other chronic pain: Secondary | ICD-10-CM | POA: Diagnosis not present

## 2020-09-18 DIAGNOSIS — M25512 Pain in left shoulder: Secondary | ICD-10-CM | POA: Diagnosis not present

## 2020-09-22 ENCOUNTER — Other Ambulatory Visit: Payer: Self-pay | Admitting: Internal Medicine

## 2020-09-22 DIAGNOSIS — E119 Type 2 diabetes mellitus without complications: Secondary | ICD-10-CM

## 2020-10-01 ENCOUNTER — Other Ambulatory Visit: Payer: Self-pay | Admitting: Internal Medicine

## 2020-10-01 DIAGNOSIS — E119 Type 2 diabetes mellitus without complications: Secondary | ICD-10-CM

## 2020-10-16 DIAGNOSIS — M25512 Pain in left shoulder: Secondary | ICD-10-CM | POA: Diagnosis not present

## 2020-10-16 DIAGNOSIS — M961 Postlaminectomy syndrome, not elsewhere classified: Secondary | ICD-10-CM | POA: Diagnosis not present

## 2020-10-16 DIAGNOSIS — G8929 Other chronic pain: Secondary | ICD-10-CM | POA: Diagnosis not present

## 2020-10-31 ENCOUNTER — Telehealth: Payer: Self-pay | Admitting: Emergency Medicine

## 2020-10-31 NOTE — Telephone Encounter (Signed)
PA for Methotrexate has been started.   Key: [id number] - PA Case ID: [id number] - Rx #: [id number], awaiting determination from [employer].

## 2020-10-31 NOTE — Telephone Encounter (Signed)
Received fax notice from [facility] that no prior authorization was needed for this patient for this medication.  [facility] and spoke to Donnelly and informed her of the information.

## 2020-11-05 DIAGNOSIS — H35033 Hypertensive retinopathy, bilateral: Secondary | ICD-10-CM | POA: Diagnosis not present

## 2020-11-05 DIAGNOSIS — H33322 Round hole, left eye: Secondary | ICD-10-CM | POA: Diagnosis not present

## 2020-11-05 DIAGNOSIS — H5211 Myopia, right eye: Secondary | ICD-10-CM | POA: Diagnosis not present

## 2020-11-05 DIAGNOSIS — H16223 Keratoconjunctivitis sicca, not specified as Sjogren's, bilateral: Secondary | ICD-10-CM | POA: Diagnosis not present

## 2020-11-05 DIAGNOSIS — H52223 Regular astigmatism, bilateral: Secondary | ICD-10-CM | POA: Diagnosis not present

## 2020-11-05 DIAGNOSIS — H5212 Myopia, left eye: Secondary | ICD-10-CM | POA: Diagnosis not present

## 2020-11-05 DIAGNOSIS — E119 Type 2 diabetes mellitus without complications: Secondary | ICD-10-CM | POA: Diagnosis not present

## 2020-11-05 DIAGNOSIS — H524 Presbyopia: Secondary | ICD-10-CM | POA: Diagnosis not present

## 2020-11-05 DIAGNOSIS — H25813 Combined forms of age-related cataract, bilateral: Secondary | ICD-10-CM | POA: Diagnosis not present

## 2020-11-05 DIAGNOSIS — H40013 Open angle with borderline findings, low risk, bilateral: Secondary | ICD-10-CM | POA: Diagnosis not present

## 2020-11-05 LAB — HM DIABETES EYE EXAM

## 2020-11-07 ENCOUNTER — Other Ambulatory Visit: Payer: Self-pay | Admitting: Internal Medicine

## 2020-11-07 DIAGNOSIS — E119 Type 2 diabetes mellitus without complications: Secondary | ICD-10-CM

## 2020-11-08 ENCOUNTER — Ambulatory Visit: Payer: Medicaid Other | Admitting: Neurology

## 2020-11-08 ENCOUNTER — Encounter: Payer: Self-pay | Admitting: Neurology

## 2020-11-08 VITALS — BP 148/87 | HR 87 | Ht 68.0 in | Wt 209.0 lb

## 2020-11-08 DIAGNOSIS — G479 Sleep disorder, unspecified: Secondary | ICD-10-CM

## 2020-11-08 DIAGNOSIS — D8689 Sarcoidosis of other sites: Secondary | ICD-10-CM

## 2020-11-08 DIAGNOSIS — G43019 Migraine without aura, intractable, without status migrainosus: Secondary | ICD-10-CM

## 2020-11-08 NOTE — Progress Notes (Signed)
Reason for visit: Migraine headache, neurosarcoidosis  [name] [name] is an 54 y.o. female  History of present illness:  [name] [name] is a 54 year old right-handed black female with a history of neurosarcoidosis.  The patient is on a very low dose of methotrexate taking 2.5 mg every week.  She has been able to taper down on the medication without difficulty.  Her last MRI of the brain was done in February 2020.  She was placed on Aimovig for her headaches, she is having about 15 headache days a month.  Aimovig seemed to help initially but she claims that the effects have worn off and she is now back to her baseline.  She also reports a lot of problems with fatigue during the day, she will jerk awake suddenly at night, she is not sure whether or not she snores at night.  She also reports that when she lays flat on her back she may have some dizziness and some discomfort in the back of the head, this will bring on a headache.  She will get nauseated with this.  She returns to the office today for an evaluation.  Past Medical History:  Diagnosis Date  . Abdominal pain 06/25/2010   CT abd/pelvis 05/2014: Very large amount of stool noted distending the colon. The size consistent with severe constipation/fecal impaction.    . Abnormal cervical Papanicolaou smear 04/17/2017  . Anemia   . Arthritis    lumbar region- radiculopathy  . Cancer [facility]) 2013    Thyroid Ca-thyroidectomy  . Chronic fatigue   . DM (diabetes mellitus) ([place])   . Gait disturbance   . GERD (gastroesophageal reflux disease)   . Headache(784.0)   . Herniated lumbar disc without myelopathy 08/20/2016  . History of osteoporosis 05/25/2007   DEXA 2014 Since the baseline examination in 2002, there has been a  statistically significant 19.5% increase in bone mineral density in  the lumbar spine and a statistically significant 14.6% increase in  bone mineral density in the left femur.   DEXA 2002 with osteoporosis of lumbar spine and  moderate osteopenia of left hip MRI 2002 left hip negative for avascular necrosis  Qualifier: Diagnosis o  . HTN (hypertension)   . Hypothyroidism   . Insomnia 01/02/2015  . Insomnia 01/02/2015  . Intermittent left-sided chest pain 01/26/2013  . Left sided numbness 12/10/2018  . Nephrolithiasis   . Obesity   . OSA (obstructive sleep apnea)    last test- many yrs. ago, done at [facility], no CPAP in use  . Osteoporosis   . Pain and swelling of lower leg, left 12/16/2017  . Peripheral neuropathy   . Prolonged QT interval   . 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 BRAIN SURGERY  2001   for sarcoidosis  . BREAST BIOPSY Left   . BREAST BIOPSY Right   . BREAST EXCISIONAL BIOPSY Right    x2 benign  . BREAST EXCISIONAL BIOPSY Left    benign  . BREAST SURGERY Bilateral    biopsies   . DILATION AND CURETTAGE OF UTERUS    . HYSTEROSCOPY    . LUMBAR LAMINECTOMY/DECOMPRESSION MICRODISCECTOMY Left 08/20/2016   Procedure: Left Lumbar four-five Microdiskectomy;  Surgeon: Leeroy Cha, MD;  Location: [facility];  Service: Neurosurgery;  Laterality: Left;  . PARTIAL HYSTERECTOMY  2013  . ROTATOR CUFF REPAIR Right    07/2013  . Suboccipital craniotomy    .  THYROIDECTOMY     Precancerous lesion    Family History  Problem Relation Age of Onset  . Heart disease Mother        questionable CAD and arrythmias   . Cancer Mother        Breast cancer  . Breast cancer Mother 61  . Venous thrombosis Sister   . Diabetes Sister   . Heart disease Sister   . Diabetes Brother   . Heart disease Brother   . Cancer Maternal Aunt   . Breast cancer Maternal Aunt   . Venous thrombosis Sister   . Diabetes Sister   . Heart disease Sister     Social history:  reports that she has never smoked. She has never used smokeless tobacco. She reports that she does not drink alcohol and does not use drugs.   No Known Allergies  Medications:   Prior to Admission medications   Medication Sig Start Date End Date Taking? Authorizing Provider  ACCU-CHEK GUIDE test strip USE AS INSTRUCTED TO CHECK BLOOD SUGAR UP TO 1 TIME A DAY 09/24/20  Yes Aslam, Sadia, MD  albuterol (VENTOLIN HFA) 108 (90 Base) MCG/ACT inhaler Inhale 2 puffs into the lungs every 6 (six) hours as needed for wheezing or shortness of breath. 05/16/20  Yes Earlene Plater, MD  aspirin 81 MG chewable tablet Chew 81 mg by mouth daily.   Yes [provider]  atorvastatin (LIPITOR) 20 MG tablet Take 1 tablet (20 mg total) by mouth daily. 08/30/20  Yes Gaylan Gerold, DO  B-D ULTRAFINE III SHORT PEN 31G X 8 MM MISC USE TO INJECT INSULIN 1 TIME DAILY DIAG CODE E11.9 INSULIN DEPENDENT 07/23/20  Yes [name] Persia, MD  blood glucose meter kit and supplies KIT Dispense based on patient and insurance preference. Use up to four times daily as directed. (FOR ICD-9 250.00, 250.01). 06/15/19  Yes Agyei, Obed K, MD  carvedilol (COREG) 6.25 MG tablet Take 1 tablet (6.25 mg total) by mouth 2 (two) times daily. 12/21/19  Yes Lelon Perla, MD  diazepam (VALIUM) 5 MG tablet Take 1 tablet one hour prior to scan 12/21/19  Yes Crenshaw, Denice Bors, MD  DULoxetine (CYMBALTA) 30 MG capsule duloxetine 30 mg capsule,delayed release  TAKE 1 CAPSULE BY MOUTH EVERY DAY   Yes [provider]  Erenumab-aooe (AIMOVIG) 140 MG/ML SOAJ Inject 140 mg into the skin every 30 (thirty) days. 05/09/20  Yes Kathrynn Ducking, MD  fluticasone (FLONASE) 50 MCG/ACT nasal spray PLACE 1 SPRAY INTO BOTH NOSTRILS DAILY. 07/05/20 07/05/21 Yes [name] Persia, MD  folic acid (FOLVITE) 1 MG tablet TAKE 1 TABLET BY MOUTH EVERY DAY 02/20/20  Yes Agyei, Caprice Kluver, MD  JARDIANCE 10 MG TABS tablet TAKE 1 TABLET BY MOUTH EVERY DAY BEFORE BREAKFAST 11/07/20  Yes Gaylan Gerold, DO  levothyroxine (SYNTHROID) 137 MCG tablet TAKE 1 TABLET BY MOUTH EVERY DAY BEFORE BREAKFAST 08/23/20  Yes Aldine Contes, MD  metFORMIN (GLUCOPHAGE)  1000 MG tablet TAKE 1 TABLET BY MOUTH TWICE A DAY WITH MEALS 10/02/20  Yes [name] Persia, MD  methotrexate (RHEUMATREX) 2.5 MG tablet TAKE 1 TABLET BY MOUTH ONCE A WEEK 08/27/20  Yes Kathrynn Ducking, MD  Multiple Vitamin (MULTIVITAMIN WITH MINERALS) TABS tablet Take 1 tablet by mouth every evening.    Yes [provider]  oxyCODONE-acetaminophen (PERCOCET) 10-325 MG tablet Take 1 tablet by mouth every 4 (four) hours as needed for pain.   Yes [provider]  pantoprazole (PROTONIX) 20 MG tablet  Take 1 tablet (20 mg total) by mouth daily. 08/29/20  Yes Gaylan Gerold, DO    ROS:  Out of a complete 14 system review of symptoms, the patient complains only of the following symptoms, and all other reviewed systems are negative.  Headache, dizziness Fatigue, daytime drowsiness  Blood pressure (!) 148/87, pulse 87, height 5' 8"  (1.727 m), weight 209 lb (94.8 kg), last menstrual period 12/03/2011.  Physical Exam  General: The patient is alert and cooperative at the time of the examination.  Skin: No significant peripheral edema is noted.   Neurologic Exam  Mental status: The patient is alert and oriented x 3 at the time of the examination. The patient has apparent normal recent and remote memory, with an apparently normal attention span and concentration ability.   Cranial nerves: Facial symmetry is present. Speech is normal, no aphasia or dysarthria is noted. Extraocular movements are full. Visual fields are full.  Motor: The patient has good strength in all 4 extremities.  Sensory examination: Soft touch sensation is symmetric on the face, arms, and legs.  Coordination: The patient has good finger-nose-finger and heel-to-shin bilaterally.  Gait and station: The patient has a normal gait. Tandem gait is unsteady. Romberg is positive, the patient has a tendency to fall backwards. No drift is seen.  Reflexes: Deep tendon reflexes are  symmetric.   Assessment/Plan:  1.  History of neurosarcoidosis  2.  Intractable migraine  3.  Excessive daytime drowsiness  The patient will be sent for a sleep evaluation.  The patient has reported occasional episodes of skewed vision where things appear to be shifted or rotated.  This may happen only a few minutes and normalized.  The patient continues on Aimovig but she has lost effectiveness of the medication.  We discussed the possibility of going on Botox, she will think about this and let me know if she wants to consider Botox therapy.  She will stop the methotrexate.  She will follow up here in 6 months, we will recheck a MRI of the brain at that time.  Jill Alexanders MD 11/08/2020 10:13 AM  [facility] [address] [place], [place] [address]  Phone [phone] Fax [phone]

## 2020-11-12 DIAGNOSIS — H43813 Vitreous degeneration, bilateral: Secondary | ICD-10-CM | POA: Diagnosis not present

## 2020-11-12 DIAGNOSIS — H2513 Age-related nuclear cataract, bilateral: Secondary | ICD-10-CM | POA: Diagnosis not present

## 2020-11-12 DIAGNOSIS — E119 Type 2 diabetes mellitus without complications: Secondary | ICD-10-CM | POA: Diagnosis not present

## 2020-11-20 ENCOUNTER — Encounter: Payer: Medicaid Other | Admitting: Internal Medicine

## 2020-11-21 ENCOUNTER — Other Ambulatory Visit: Payer: Self-pay

## 2020-11-21 ENCOUNTER — Ambulatory Visit: Payer: Medicaid Other | Admitting: Internal Medicine

## 2020-11-21 ENCOUNTER — Encounter: Payer: Self-pay | Admitting: Internal Medicine

## 2020-11-21 VITALS — BP 148/79 | HR 89 | Temp 98.3°F | Ht 68.0 in | Wt 203.4 lb

## 2020-11-21 DIAGNOSIS — E890 Postprocedural hypothyroidism: Secondary | ICD-10-CM | POA: Diagnosis not present

## 2020-11-21 DIAGNOSIS — K5909 Other constipation: Secondary | ICD-10-CM

## 2020-11-21 DIAGNOSIS — E785 Hyperlipidemia, unspecified: Secondary | ICD-10-CM | POA: Diagnosis not present

## 2020-11-21 DIAGNOSIS — I10 Essential (primary) hypertension: Secondary | ICD-10-CM | POA: Diagnosis not present

## 2020-11-21 DIAGNOSIS — E119 Type 2 diabetes mellitus without complications: Secondary | ICD-10-CM

## 2020-11-21 DIAGNOSIS — D8689 Sarcoidosis of other sites: Secondary | ICD-10-CM

## 2020-11-21 LAB — POCT GLYCOSYLATED HEMOGLOBIN (HGB A1C): Hemoglobin A1C: 6 % — AB (ref 4.0–5.6)

## 2020-11-21 LAB — GLUCOSE, CAPILLARY: Glucose-Capillary: 85 mg/dL (ref 70–99)

## 2020-11-21 MED ORDER — AMLODIPINE BESYLATE 5 MG PO TABS: 5.0000 mg | ORAL_TABLET | Freq: Every day | ORAL | 3 refills | Status: DC

## 2020-11-21 MED ORDER — LOSARTAN POTASSIUM 50 MG PO TABS: 50.0000 mg | ORAL_TABLET | Freq: Every day | ORAL | 3 refills | Status: DC

## 2020-11-21 NOTE — Assessment & Plan Note (Signed)
History of neurosarcoidosis: It appears that her methotrexate has been discontinued by neurology and plan to repeat brain imaging in 6 months.

## 2020-11-21 NOTE — Assessment & Plan Note (Signed)
Diabetes mellitus: Last A1c 5.9%.  A1c today 6%.  Plan: -Continue Metformin 1000 mg twice daily -Continue Jardiance 10 mg daily

## 2020-11-21 NOTE — Assessment & Plan Note (Signed)
Hypothyroidism: Controlled on Synthroid 137 mcg daily

## 2020-11-21 NOTE — Assessment & Plan Note (Signed)
Hyperlipidemia: LDL not at goal.  Last lipid panel in September 21 showed LDL of 120  Plan: -Repeat lipid panel today -If elevated, will increase Lipitor based on her ASCVD

## 2020-11-21 NOTE — Assessment & Plan Note (Addendum)
Hypertension: She has been off her lisinopril due to cough.  She states that her cough stopped when she ceased taking lisinopril.  BP not at goal  BP Readings from Last 3 Encounters:  11/21/20 (!) 148/79  11/08/20 (!) 148/87  08/29/20 136/86    Plan: -Continue Coreg 6.25 mg twice daily -Reports of cough that stopped with cessation of lisinopril -Start losartan 50 mg daily

## 2020-11-21 NOTE — Progress Notes (Signed)
   CC: Follow-up hypothyroidism, hypertension, hyperlipidemia  HPI:  [name] is a 54 y.o. with medical history listed below presented to follow-up on chronic medical problems.  Please see problem based charting for further details.  Past Medical History:  Diagnosis Date  . Abdominal pain 06/25/2010   CT abd/pelvis 05/2014: Very large amount of stool noted distending the colon. The size consistent with severe constipation/fecal impaction.    . Abnormal cervical Papanicolaou smear 04/17/2017  . Anemia   . Arthritis    lumbar region- radiculopathy  . Cancer [facility]) 2013    Thyroid Ca-thyroidectomy  . Chronic fatigue   . DM (diabetes mellitus) ([place])   . Gait disturbance   . GERD (gastroesophageal reflux disease)   . Headache(784.0)   . Herniated lumbar disc without myelopathy 08/20/2016  . History of osteoporosis 05/25/2007   DEXA 2014 Since the baseline examination in 2002, there has been a  statistically significant 19.5% increase in bone mineral density in  the lumbar spine and a statistically significant 14.6% increase in  bone mineral density in the left femur.   DEXA 2002 with osteoporosis of lumbar spine and moderate osteopenia of left hip MRI 2002 left hip negative for avascular necrosis  Qualifier: Diagnosis o  . HTN (hypertension)   . Hypothyroidism   . Insomnia 01/02/2015  . Insomnia 01/02/2015  . Intermittent left-sided chest pain 01/26/2013  . Left sided numbness 12/10/2018  . Nephrolithiasis   . Obesity   . OSA (obstructive sleep apnea)    last test- many yrs. ago, done at [facility], no CPAP in use  . Osteoporosis   . Pain and swelling of lower leg, left 12/16/2017  . Peripheral neuropathy   . Prolonged QT interval   . Sarcoidosis    sarcoidosis in Brain, treated with surgery   Review of Systems:  As per HPI  Physical Exam:  Vitals:   11/21/20 0857  BP: (!) 148/79  Pulse: 89  Temp: 98.3 F (36.8 C)  TempSrc: Oral  SpO2: 100%  Weight: 203 lb 6.4 oz (92.3 kg)   Height: 5\' 8"  (1.727 m)   Physical Exam Vitals and nursing note reviewed.  Cardiovascular:     Rate and Rhythm: Normal rate.     Heart sounds: Normal heart sounds.  Pulmonary:     Breath sounds: Normal breath sounds.  Abdominal:     General: Bowel sounds are normal.  Musculoskeletal:     Cervical back: Neck supple.  Psychiatric:        Mood and Affect: Mood normal.     Assessment & Plan:   See Encounters Tab for problem based charting.  Patient discussed with Dr. Dareen Piano

## 2020-11-21 NOTE — Assessment & Plan Note (Signed)
Chronic constipation: She recently underwent an anorectal manometry which showed a weak internal and external anal sphincter.  She was advised to follow-up with urogynecology and to embark on pelvic floor exercises

## 2020-11-21 NOTE — Patient Instructions (Addendum)
Ms. Ow,  Thanks for seeing me today.  Overall, you are doing well.  Your diabetes is under control.  For your high blood pressure, we are adding a new medicine called losartan 50 mg daily.  Stop taking the lisinopril.  I am checking your cholesterol numbers today, if it is elevated we will increase the cholesterol medication.  Take care! Dr. Eileen Stanford  Please call the internal medicine center clinic if you have any questions or concerns, we may be able to help and keep you from a long and expensive emergency room wait. Our clinic and after hours phone number is [phone], the best time to call is Monday through Friday 9 am to 4 pm but there is always someone available 24/7 if you have an emergency. If you need medication refills please notify your pharmacy one week in advance and they will send [name] a request.   If you have not gotten the COVID vaccine, I recommend doing so:  You may get it at your local [facility] or [facility] OR To schedule an appointment for a COVID vaccine or be added to the vaccine wait list: Go to [email or web address]   OR Go to [email or web address]                  OR Call [phone]                                     OR Call [phone] and select Option 2

## 2020-11-22 LAB — LIPID PANEL
Chol/HDL Ratio: 3 ratio (ref 0.0–4.4)
Cholesterol, Total: 127 mg/dL (ref 100–199)
HDL: 43 mg/dL (ref >39)
LDL Chol Calc (NIH): 70 mg/dL (ref 0–99)
Triglycerides: 66 mg/dL (ref 0–149)
VLDL Cholesterol Cal: 14 mg/dL (ref 5–40)

## 2020-11-22 LAB — MICROALBUMIN / CREATININE URINE RATIO
Creatinine, Urine: 99.7 mg/dL
Microalb/Creat Ratio: 13 mg/g creat (ref 0–29)
Microalbumin, Urine: 12.7 ug/mL

## 2020-11-28 ENCOUNTER — Other Ambulatory Visit: Payer: Self-pay | Admitting: *Deleted

## 2020-11-28 DIAGNOSIS — E119 Type 2 diabetes mellitus without complications: Secondary | ICD-10-CM

## 2020-11-28 MED ORDER — EMPAGLIFLOZIN 10 MG PO TABS: ORAL_TABLET | ORAL | 2 refills | Status: DC

## 2020-11-28 NOTE — Progress Notes (Signed)
Internal Medicine Clinic Attending  Case discussed with Dr. Agyei  At the time of the visit.  We reviewed the resident's history and exam and pertinent patient test results.  I agree with the assessment, diagnosis, and plan of care documented in the resident's note.  

## 2020-11-28 NOTE — Telephone Encounter (Signed)
[facility] stated pres[facility] (Dr Cyndie Chime) is not contracted with  Medicaid. I will ask The Attending to re-send.

## 2020-12-05 ENCOUNTER — Other Ambulatory Visit: Payer: Self-pay | Admitting: Internal Medicine

## 2020-12-05 DIAGNOSIS — I10 Essential (primary) hypertension: Secondary | ICD-10-CM

## 2020-12-08 ENCOUNTER — Other Ambulatory Visit: Payer: Self-pay | Admitting: Internal Medicine

## 2020-12-08 DIAGNOSIS — E119 Type 2 diabetes mellitus without complications: Secondary | ICD-10-CM

## 2020-12-20 ENCOUNTER — Institutional Professional Consult (permissible substitution): Payer: Medicaid Other | Admitting: Neurology

## 2021-01-14 ENCOUNTER — Institutional Professional Consult (permissible substitution): Payer: Medicaid Other | Admitting: Neurology

## 2021-01-17 DIAGNOSIS — H5213 Myopia, bilateral: Secondary | ICD-10-CM | POA: Diagnosis not present

## 2021-02-04 ENCOUNTER — Encounter: Payer: Self-pay | Admitting: *Deleted

## 2021-02-04 ENCOUNTER — Other Ambulatory Visit: Payer: Self-pay | Admitting: Internal Medicine

## 2021-02-06 ENCOUNTER — Ambulatory Visit: Payer: Medicaid Other | Admitting: Neurology

## 2021-02-06 ENCOUNTER — Other Ambulatory Visit: Payer: Self-pay

## 2021-02-06 ENCOUNTER — Encounter: Payer: Self-pay | Admitting: Neurology

## 2021-02-06 VITALS — BP 130/82 | HR 100 | Ht 68.0 in | Wt 204.0 lb

## 2021-02-06 DIAGNOSIS — R351 Nocturia: Secondary | ICD-10-CM

## 2021-02-06 DIAGNOSIS — R519 Headache, unspecified: Secondary | ICD-10-CM | POA: Diagnosis not present

## 2021-02-06 DIAGNOSIS — Z820 Family history of epilepsy and other diseases of the nervous system: Secondary | ICD-10-CM | POA: Diagnosis not present

## 2021-02-06 DIAGNOSIS — E669 Obesity, unspecified: Secondary | ICD-10-CM

## 2021-02-06 DIAGNOSIS — R0683 Snoring: Secondary | ICD-10-CM | POA: Diagnosis not present

## 2021-02-06 DIAGNOSIS — G4719 Other hypersomnia: Secondary | ICD-10-CM

## 2021-02-06 NOTE — Progress Notes (Signed)
Subjective:    Patient ID: [name] [name] is a 55 y.o. female.  HPI     [name], MD, PhD [facility] [address] [place], [place] [address]  Dear Lanny Hurst,   I saw your patient, [name] [name], upon your kind request in my Sleep clinic today for initial consultation of her sleep disorder, concern for underlying obstructive sleep apnea.  The patient is unaccompanied today.  As you know, [name] [name] is a 55 year old right-handed woman with an underlying complex medical history of thyroid cancer, neurosarcoidosis, peripheral neuropathy, osteoporosis, hypertension, hypothyroidism, diabetes, reflux disease, arthritis, anemia, kidney stones, and mild obesity, who reports daytime somnolence and sleep disruption.  She had a sleep study some years ago but has not been on CPAP therapy.  Prior sleep study results are not available for my review today.  I reviewed your office note from 11/08/2020.  Her Epworth sleepiness score is 13 out of 24, fatigue severity score is 41 out of 63.  She may have had mild sleep apnea but is not sure.  She lives alone, her 20-year-old granddaughter often stays with her during the day or at night when patient's daughter works.  Patient has no pets in the house, she does have a TV in the bedroom and occasionally watches it at night.  Her bedtime and rise time are variable.  She is in bed between 8 and 11 generally speaking and rise time is around 9, generally speaking.  She has nocturia about once or twice per average night.  She reports that her headaches have increased after she was weaned off of methotrexate recently.  She has not talked to you about it yet.  She has woken up with a headache and also with a sense of gasping for air and from her own snoring.  Her mom has sleep apnea and has a CPAP machine.  Patient would consider a CPAP machine if the need arises.    Her Past Medical History Is Significant For: Past Medical History:   Diagnosis Date  . Abdominal pain 06/25/2010   CT abd/pelvis 05/2014: Very large amount of stool noted distending the colon. The size consistent with severe constipation/fecal impaction.    . Abnormal cervical Papanicolaou smear 04/17/2017  . Anemia   . Arthritis    lumbar region- radiculopathy  . Cancer [facility]) 2013    Thyroid Ca-thyroidectomy  . Chronic fatigue   . DM (diabetes mellitus) ([place])   . Gait disturbance   . GERD (gastroesophageal reflux disease)   . Headache(784.0)   . Herniated lumbar disc without myelopathy 08/20/2016  . History of osteoporosis 05/25/2007   DEXA 2014 Since the baseline examination in 2002, there has been a  statistically significant 19.5% increase in bone mineral density in  the lumbar spine and a statistically significant 14.6% increase in  bone mineral density in the left femur.   DEXA 2002 with osteoporosis of lumbar spine and moderate osteopenia of left hip MRI 2002 left hip negative for avascular necrosis  Qualifier: Diagnosis o  . HTN (hypertension)   . Hypothyroidism   . Insomnia 01/02/2015  . Insomnia 01/02/2015  . Intermittent left-sided chest pain 01/26/2013  . Left sided numbness 12/10/2018  . Nephrolithiasis   . Obesity   . OSA (obstructive sleep apnea)    last test- many yrs. ago, done at [facility], no CPAP in use  . Osteoporosis   . Pain and swelling of lower leg, left 12/16/2017  . Peripheral  neuropathy   . Prolonged QT interval   . 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 BRAIN SURGERY  2001   for sarcoidosis  . BREAST BIOPSY Left   . BREAST BIOPSY Right   . BREAST EXCISIONAL BIOPSY Right    x2 benign  . BREAST EXCISIONAL BIOPSY Left    benign  . BREAST SURGERY Bilateral    biopsies   . DILATION AND CURETTAGE OF UTERUS    . HYSTEROSCOPY    . LUMBAR LAMINECTOMY/DECOMPRESSION MICRODISCECTOMY Left 08/20/2016   Procedure:  Left Lumbar four-five Microdiskectomy;  Surgeon: Leeroy Cha, MD;  Location: [facility];  Service: Neurosurgery;  Laterality: Left;  . PARTIAL HYSTERECTOMY  2013  . ROTATOR CUFF REPAIR Right    07/2013  . Suboccipital craniotomy    . THYROIDECTOMY     Precancerous lesion    Her Family History Is Significant For: Family History  Problem Relation Age of Onset  . Heart disease Mother        questionable CAD and arrythmias   . Cancer Mother        Breast cancer  . Breast cancer Mother 77  . Venous thrombosis Sister   . Diabetes Sister   . Heart disease Sister   . Diabetes Brother   . Heart disease Brother   . Cancer Maternal Aunt   . Breast cancer Maternal Aunt   . Venous thrombosis Sister   . Diabetes Sister   . Heart disease Sister     Her Social History Is Significant For: Social History   Socioeconomic History  . Marital status: Married    Spouse name: Neill Loft  . Number of children: 4  . Years of education: 35  . Highest education level: Not on file  Occupational History    Employer: UNEMPLOYED  Tobacco Use  . Smoking status: Never Smoker  . Smokeless tobacco: Never Used  Vaping Use  . Vaping Use: Never used  Substance and Sexual Activity  . Alcohol use: No    Alcohol/week: 0.0 standard drinks  . Drug use: No  . Sexual activity: Not on file    Comment: hysterectomy  Other Topics Concern  . Not on file  Social History Narrative   Lives in [place] with 2 sons and her husband;disabled   Patient drinks 2 glasses of tea daily.   Patient is right handed.    Social Determinants of Health   Financial Resource Strain: Not on file  Food Insecurity: Not on file  Transportation Needs: Not on file  Physical Activity: Not on file  Stress: Not on file  Social Connections: Not on file    Her Allergies Are:  Allergies  Allergen Reactions  . Lisinopril Cough  :   Her Current Medications Are:  Outpatient Encounter Medications as of 02/06/2021   Medication Sig  . ACCU-CHEK GUIDE test strip USE AS INSTRUCTED TO CHECK BLOOD SUGAR UP TO 1 TIME A DAY  . albuterol (VENTOLIN HFA) 108 (90 Base) MCG/ACT inhaler Inhale 2 puffs into the lungs every 6 (six) hours as needed for wheezing or shortness of breath.  Marland Kitchen aspirin 81 MG chewable tablet Chew 81 mg by mouth daily.  Marland Kitchen atorvastatin (LIPITOR) 20 MG tablet Take 1 tablet (20 mg total) by mouth daily.  . B-D ULTRAFINE III SHORT PEN 31G X 8 MM MISC USE TO INJECT INSULIN 1  TIME DAILY DIAG CODE E11.9 INSULIN DEPENDENT  . blood glucose meter kit and supplies KIT Dispense based on patient and insurance preference. Use up to four times daily as directed. (FOR ICD-9 250.00, 250.01).  . carvedilol (COREG) 6.25 MG tablet Take 1 tablet (6.25 mg total) by mouth 2 (two) times daily.  . diazepam (VALIUM) 5 MG tablet Take 1 tablet one hour prior to scan  . Dulaglutide (TRULICITY) 4.09 BD/5.3GD SOPN Inject 0.75 mg into the skin once a week.  . DULoxetine (CYMBALTA) 30 MG capsule duloxetine 30 mg capsule,delayed release  TAKE 1 CAPSULE BY MOUTH EVERY DAY  . empagliflozin (JARDIANCE) 10 MG TABS tablet TAKE 1 TABLET BY MOUTH EVERY DAY BEFORE BREAKFAST  . Erenumab-aooe (AIMOVIG) 140 MG/ML SOAJ Inject 140 mg into the skin every 30 (thirty) days.  . fluticasone (FLONASE) 50 MCG/ACT nasal spray PLACE 1 SPRAY INTO BOTH NOSTRILS DAILY.  . folic acid (FOLVITE) 1 MG tablet TAKE 1 TABLET BY MOUTH EVERY DAY  . levothyroxine (SYNTHROID) 137 MCG tablet TAKE 1 TABLET BY MOUTH EVERY DAY BEFORE BREAKFAST  . losartan (COZAAR) 50 MG tablet Take 1 tablet (50 mg total) by mouth daily.  . metFORMIN (GLUCOPHAGE) 1000 MG tablet TAKE 1 TABLET BY MOUTH TWICE A DAY WITH MEALS  . Multiple Vitamin (MULTIVITAMIN WITH MINERALS) TABS tablet Take 1 tablet by mouth every evening.   Marland Kitchen oxyCODONE-acetaminophen (PERCOCET) 10-325 MG tablet Take 1 tablet by mouth every 4 (four) hours as needed for pain.  . pantoprazole (PROTONIX) 20 MG tablet Take 1  tablet (20 mg total) by mouth daily.   No facility-administered encounter medications on file as of 02/06/2021.  :   Review of Systems:  Out of a complete 14 point review of systems, all are reviewed and negative with the exception of these symptoms as listed below:  Review of Systems  Neurological:       Here for sleep consult. Reports prior sleep study, but does not remember if any abnormalities were seen. Pt reports she may snore at night.  Epworth Sleepiness Scale 0= would never doze 1= slight chance of dozing 2= moderate chance of dozing 3= high chance of dozing  Sitting and reading:2 Watching TV:2 Sitting inactive in a public place (ex. Theater or meeting):2 As a passenger in a car for an hour without a break:3 Lying down to rest in the afternoon:1 Sitting and talking to someone:1 Sitting quietly after lunch (no alcohol):2 In a car, while stopped in traffic:0 Total:13     Objective:  Neurological Exam  Physical Exam Physical Examination:   Vitals:   02/06/21 1323  BP: 130/82  Pulse: 100    General Examination: The patient is a very pleasant 55 y.o. female in no acute distress. She appears well-developed and well-nourished and well groomed.   HEENT: Normocephalic, atraumatic, pupils are equal, round and reactive to light, extraocular tracking is good without limitation to gaze excursion or nystagmus noted. Hearing is grossly intact. Face is symmetric with normal facial animation. Speech is clear with no dysarthria noted. There is no hypophonia. There is no lip, neck/head, jaw or voice tremor. Neck is supple with full range of passive and active motion. There are no carotid bruits on auscultation. Oropharynx exam reveals: mild mouth dryness, adequate dental hygiene and no significant airway crowding, small uvula noted, Mallampati class I, tonsillar size 1+.  Tongue protrudes centrally and palate elevates symmetrically, she has a mild overbite.  Neck circumference of  13-1/4 inches.    Chest:  Clear to auscultation without wheezing, rhonchi or crackles noted.  Heart: S1+S2+0, regular and normal without murmurs, rubs or gallops noted.   Abdomen: Soft, non-tender and non-distended with normal bowel sounds appreciated on auscultation.  Extremities: There is no pitting edema in the distal lower extremities bilaterally.  Mild puffiness noted in the distal left lower extremity, she reports that she is status post left foot surgery some 2 years ago because of multiple tendon injuries.  Skin: Warm and dry without trophic changes noted.   Musculoskeletal: exam reveals no obvious joint deformities, tenderness or joint swelling or erythema.   Neurologically:  Mental status: The patient is awake, alert and oriented in all 4 spheres. Her immediate and remote memory, attention, language skills and fund of knowledge are appropriate. There is no evidence of aphasia, agnosia, apraxia or anomia. Speech is clear with normal prosody and enunciation. Thought process is linear. Mood is normal and affect is normal.  Cranial nerves II - XII are as described above under HEENT exam.  Motor exam: Normal bulk, strength and tone is noted. There is no tremor, Romberg is negative. Fine motor skills and coordination: grossly intact.  Cerebellar testing: No dysmetria or intention tremor. There is no truncal or gait ataxia.  Sensory exam: intact to light touch in the upper and lower extremities.  Gait, station and balance: She stands easily. No veering to one side is noted. No leaning to one side is noted. Posture is age-appropriate and stance is narrow based. Gait shows normal stride length and normal pace. No problems turning are noted. Tandem walk is difficult for her and she reports having trouble with her balance.                  Assessment and Plan:  In summary, SHALAYA SWAILES is a very pleasant 54 y.o.-year old female with an underlying complex medical history of thyroid cancer,  neurosarcoidosis, peripheral neuropathy, osteoporosis, hypertension, hypothyroidism, diabetes, reflux disease, arthritis, anemia, kidney stones, and mild obesity, whose history and physical exam are concerning for obstructive sleep apnea (OSA). I had a long chat with the patient about my findings and the diagnosis of OSA, its prognosis and treatment options. We talked about medical treatments, surgical interventions and non-pharmacological approaches. I explained in particular the risks and ramifications of untreated moderate to severe OSA, especially with respect to developing cardiovascular disease down the Road, including congestive heart failure, difficult to treat hypertension, cardiac arrhythmias, or stroke. Even type 2 diabetes has, in part, been linked to untreated OSA. Symptoms of untreated OSA include daytime sleepiness, memory problems, mood irritability and mood disorder such as depression and anxiety, lack of energy, as well as recurrent headaches, especially morning headaches. We talked about trying to maintain a healthy lifestyle in general, as well as the importance of weight control. We also talked about the importance of good sleep hygiene. I recommended the following at this time: sleep study.   I explained the sleep test procedure to the patient and also outlined possible surgical and non-surgical treatment options of OSA, including the use of a custom-made dental device (which would require a referral to a specialist dentist or oral surgeon), upper airway surgical options (which would involve a referral to an ENT surgeon). I also explained the CPAP treatment option to the patient, who indicated that she would be willing to try CPAP if the need arises. I explained the importance of being compliant with PAP treatment, not only for insurance purposes but primarily to improve  Her symptoms, and for the patient's long term health benefit, including to reduce Her cardiovascular risks. I answered  all her questions today and the patient was in agreement. I plan to see her back after the sleep study is completed and encouraged her to call with any interim questions, concerns, problems or updates.   Thank you very much for allowing me to participate in the care of this nice patient. If I can be of any further assistance to you please do not hesitate to talk to me.  Sincerely,   [name], MD, PhD

## 2021-02-06 NOTE — Patient Instructions (Signed)
Here is what we discussed today and what we came up with as our plan for you:   Please talk to Dr. Jannifer Franklin about your concern regarding the methotrexate and your increase in headaches after stopping it.   Based on your symptoms and your exam I believe you may be at some risk for obstructive sleep apnea (aka OSA), and I think we should proceed with a sleep study to determine whether you do or do not have OSA and how severe it is. Even, if you have mild OSA, I may want you to consider treatment with CPAP, as treatment of even borderline or mild sleep apnea can result and improvement of symptoms such as sleep disruption, daytime sleepiness, nighttime bathroom breaks, restless leg symptoms, improvement of headache syndromes, even improved mood disorder.   As explained, an attended sleep study meaning you get to stay overnight in the sleep lab, lets [name] monitor sleep-related behaviors such as sleep talking and leg movements in sleep, in addition to monitoring for sleep apnea.  A home sleep test is a screening tool for sleep apnea only, and unfortunately does not help with any other sleep-related diagnoses.  Please remember, the long-term risks and ramifications of untreated moderate to severe obstructive sleep apnea are: increased Cardiovascular disease, including congestive heart failure, stroke, difficult to control hypertension, treatment resistant obesity, arrhythmias, especially irregular heartbeat commonly known as A. Fib. (atrial fibrillation); even type 2 diabetes has been linked to untreated OSA.   Sleep apnea can cause disruption of sleep and sleep deprivation in most cases, which, in turn, can cause recurrent headaches, problems with memory, mood, concentration, focus, and vigilance. Most people with untreated sleep apnea report excessive daytime sleepiness, which can affect their ability to drive. Please do not drive if you feel sleepy. Patients with sleep apnea can also develop difficulty initiating  and maintaining sleep (aka insomnia).   Having sleep apnea may increase your risk for other sleep disorders, including involuntary behaviors sleep such as sleep terrors, sleep talking, sleepwalking.    Having sleep apnea can also increase your risk for restless leg syndrome and leg movements at night.   Please note that untreated obstructive sleep apnea may carry additional perioperative morbidity. Patients with significant obstructive sleep apnea (typically, in the moderate to severe degree) should receive, if possible, perioperative PAP (positive airway pressure) therapy and the surgeons and particularly the anesthesiologists should be informed of the diagnosis and the severity of the sleep disordered breathing.   I will likely see you back after your sleep study to go over the test results and where to go from there. We will call you after your sleep study to advise about the results (most likely, you will hear from [facility], my nurse) and to set up an appointment at the time, as necessary.    Our sleep lab administrative assistant will call you to schedule your sleep study and give you further instructions, regarding the check in process for the sleep study, arrival time, what to bring, when you can expect to leave after the study, etc., and to answer any other logistical questions you may have. If you don't hear back from her by about 2 weeks from now, please feel free to call her direct line at [phone] or you can call our general clinic number, or email [name] through My Chart.

## 2021-02-11 ENCOUNTER — Telehealth: Payer: Self-pay

## 2021-02-11 NOTE — Telephone Encounter (Signed)
LVM for pt to call me back to schedule sleep study  

## 2021-02-15 ENCOUNTER — Other Ambulatory Visit: Payer: Self-pay | Admitting: Internal Medicine

## 2021-02-15 DIAGNOSIS — I10 Essential (primary) hypertension: Secondary | ICD-10-CM

## 2021-02-17 ENCOUNTER — Other Ambulatory Visit: Payer: Self-pay | Admitting: Internal Medicine

## 2021-02-17 DIAGNOSIS — E119 Type 2 diabetes mellitus without complications: Secondary | ICD-10-CM

## 2021-02-19 ENCOUNTER — Other Ambulatory Visit: Payer: Self-pay | Admitting: Student

## 2021-02-19 DIAGNOSIS — E119 Type 2 diabetes mellitus without complications: Secondary | ICD-10-CM

## 2021-02-27 ENCOUNTER — Telehealth: Payer: Self-pay

## 2021-02-27 NOTE — Telephone Encounter (Signed)
LVM for pt to call me back to schedule sleep study  

## 2021-03-01 DIAGNOSIS — Z419 Encounter for procedure for purposes other than remedying health state, unspecified: Secondary | ICD-10-CM | POA: Diagnosis not present

## 2021-03-05 ENCOUNTER — Telehealth: Payer: Self-pay | Admitting: *Deleted

## 2021-03-05 ENCOUNTER — Telehealth: Payer: Self-pay

## 2021-03-05 DIAGNOSIS — G8929 Other chronic pain: Secondary | ICD-10-CM | POA: Diagnosis not present

## 2021-03-05 DIAGNOSIS — G894 Chronic pain syndrome: Secondary | ICD-10-CM | POA: Diagnosis not present

## 2021-03-05 DIAGNOSIS — M25512 Pain in left shoulder: Secondary | ICD-10-CM | POA: Diagnosis not present

## 2021-03-05 DIAGNOSIS — M961 Postlaminectomy syndrome, not elsewhere classified: Secondary | ICD-10-CM | POA: Diagnosis not present

## 2021-03-05 DIAGNOSIS — M5136 Other intervertebral disc degeneration, lumbar region: Secondary | ICD-10-CM | POA: Diagnosis not present

## 2021-03-05 DIAGNOSIS — M79662 Pain in left lower leg: Secondary | ICD-10-CM | POA: Diagnosis not present

## 2021-03-05 NOTE — Telephone Encounter (Signed)
We have attempted to call the patient two times to schedule sleep study.  Patient has been unavailable at the phone numbers we have on file and has not returned our calls. If patient calls back we will schedule them for their sleep study.  

## 2021-03-05 NOTE — Telephone Encounter (Signed)
Information was sent to [facility] for  PA for for Trulicity on 12/06/6058 .  Approved with start date of 12/04/2020.  End date until further notice.  Approved for 2 units per 28 days.  May fill up to a 34 day supply at a time.  For maintenance drus a 90 day supply can be done.   Sander Nephew, RN 03/05/2021 12:52 PM.

## 2021-03-08 ENCOUNTER — Other Ambulatory Visit: Payer: Self-pay | Admitting: Internal Medicine

## 2021-03-08 DIAGNOSIS — I10 Essential (primary) hypertension: Secondary | ICD-10-CM

## 2021-03-08 NOTE — Telephone Encounter (Signed)
It looks like she's suppose to be only on losartan and carvedilol for her bp.

## 2021-03-09 ENCOUNTER — Other Ambulatory Visit: Payer: Self-pay | Admitting: Internal Medicine

## 2021-03-09 DIAGNOSIS — I10 Essential (primary) hypertension: Secondary | ICD-10-CM

## 2021-03-11 ENCOUNTER — Other Ambulatory Visit: Payer: Self-pay | Admitting: Internal Medicine

## 2021-03-11 DIAGNOSIS — E119 Type 2 diabetes mellitus without complications: Secondary | ICD-10-CM

## 2021-03-16 ENCOUNTER — Other Ambulatory Visit: Payer: Self-pay | Admitting: Internal Medicine

## 2021-03-16 DIAGNOSIS — E119 Type 2 diabetes mellitus without complications: Secondary | ICD-10-CM

## 2021-03-19 DIAGNOSIS — H524 Presbyopia: Secondary | ICD-10-CM | POA: Diagnosis not present

## 2021-03-21 ENCOUNTER — Ambulatory Visit: Payer: Medicaid Other | Admitting: Internal Medicine

## 2021-03-21 ENCOUNTER — Encounter: Payer: Self-pay | Admitting: Internal Medicine

## 2021-03-21 ENCOUNTER — Other Ambulatory Visit: Payer: Self-pay

## 2021-03-21 VITALS — BP 139/78 | HR 79 | Temp 98.2°F | Ht 68.0 in | Wt 212.9 lb

## 2021-03-21 DIAGNOSIS — E890 Postprocedural hypothyroidism: Secondary | ICD-10-CM

## 2021-03-21 DIAGNOSIS — I5020 Unspecified systolic (congestive) heart failure: Secondary | ICD-10-CM

## 2021-03-21 DIAGNOSIS — Z0000 Encounter for general adult medical examination without abnormal findings: Secondary | ICD-10-CM | POA: Diagnosis not present

## 2021-03-21 DIAGNOSIS — D8689 Sarcoidosis of other sites: Secondary | ICD-10-CM | POA: Diagnosis not present

## 2021-03-21 DIAGNOSIS — Z23 Encounter for immunization: Secondary | ICD-10-CM | POA: Diagnosis not present

## 2021-03-21 DIAGNOSIS — E119 Type 2 diabetes mellitus without complications: Secondary | ICD-10-CM | POA: Diagnosis not present

## 2021-03-21 LAB — POCT GLYCOSYLATED HEMOGLOBIN (HGB A1C): Hemoglobin A1C: 6.1 % — AB (ref 4.0–5.6)

## 2021-03-21 LAB — GLUCOSE, CAPILLARY: Glucose-Capillary: 86 mg/dL (ref 70–99)

## 2021-03-21 NOTE — Patient Instructions (Addendum)
Ms [name] [name],  It was a pleasure seeing you in clinic. Today we discussed:   Diabetes: Your A1c is 6.1 at this visit. Please continue your medications at this time.  You received your pneumonia vaccine at this visit. I am also checking some lab work and will call you with any abnormal results.   If you have any questions or concerns, please call our clinic at [phone] between 9am-5pm and after hours call [phone] and ask for the internal medicine resident on call. If you feel you are having a medical emergency please call 911.   Thank you, we look forward to helping you remain healthy!  If you have not gotten the COVID vaccine, I recommend doing so:  You may get it at your local [facility] or [facility] OR To schedule an appointment for a COVID vaccine or be added to the vaccine wait list: Go to [email or web address]   OR Go to [email or web address]                  OR Call [phone]                                     OR Call [phone] and select Option 2

## 2021-03-22 LAB — CMP14 + ANION GAP
ALT: 22 IU/L (ref 0–32)
AST: 21 IU/L (ref 0–40)
Albumin/Globulin Ratio: 1.5 (ref 1.2–2.2)
Albumin: 4.3 g/dL (ref 3.8–4.9)
Alkaline Phosphatase: 71 IU/L (ref 44–121)
Anion Gap: 18 mmol/L (ref 10.0–18.0)
BUN/Creatinine Ratio: 21 (ref 9–23)
BUN: 14 mg/dL (ref 6–24)
Bilirubin Total: <0.2 mg/dL (ref 0.0–1.2)
CO2: 23 mmol/L (ref 20–29)
Calcium: 9.2 mg/dL (ref 8.7–10.2)
Chloride: 101 mmol/L (ref 96–106)
Creatinine, Ser: 0.67 mg/dL (ref 0.57–1.00)
Globulin, Total: 2.9 g/dL (ref 1.5–4.5)
Glucose: 72 mg/dL (ref 65–99)
Potassium: 4.7 mmol/L (ref 3.5–5.2)
Sodium: 142 mmol/L (ref 134–144)
Total Protein: 7.2 g/dL (ref 6.0–8.5)
eGFR: 104 mL/min/{1.73_m2} (ref >59)

## 2021-03-22 LAB — CBC
Hematocrit: 37.3 % (ref 34.0–46.6)
Hemoglobin: 12 g/dL (ref 11.1–15.9)
MCH: 27.4 pg (ref 26.6–33.0)
MCHC: 32.2 g/dL (ref 31.5–35.7)
MCV: 85 fL (ref 79–97)
Platelets: 329 10*3/uL (ref 150–450)
RBC: 4.38 x10E6/uL (ref 3.77–5.28)
RDW: 13.4 % (ref 11.7–15.4)
WBC: 5.7 10*3/uL (ref 3.4–10.8)

## 2021-03-22 LAB — TSH: TSH: 1.18 u[IU]/mL (ref 0.450–4.500)

## 2021-03-22 NOTE — Assessment & Plan Note (Signed)
Uptodate with colonoscopy and mammogram Uptodate with COVID vaccines and flu vaccine Patient received PPSV13 at thsi visit  She has history of partial hysterectomy and would like [facility] sm[facility]. Referral placed

## 2021-03-22 NOTE — Assessment & Plan Note (Signed)
HbA1c 6.1 at this visit. Patient is currently on Jardiance and Metformin and is tolerating this well.  Plan: Continue metformin 1000mg  bid Continue Jardiance 10mg  daily Can consider switching to Synjardy to decrease pill burden, will discuss at next visit F/u in 3 months for HbA1c

## 2021-03-22 NOTE — Progress Notes (Signed)
   CC: diabetes f/u  HPI:  [name] is a 55 y.o. female with PMHx as stated below presenting for follow up of diabetes. She does not have any acute concerns at this time. Please see problem based charting for complete assessment and plan.   Past Medical History:  Diagnosis Date  . Abdominal pain 06/25/2010   CT abd/pelvis 05/2014: Very large amount of stool noted distending the colon. The size consistent with severe constipation/fecal impaction.    . Abnormal cervical Papanicolaou smear 04/17/2017  . Anemia   . Arthritis    lumbar region- radiculopathy  . Cancer [facility]) 2013    Thyroid Ca-thyroidectomy  . Chronic fatigue   . DM (diabetes mellitus) ([place])   . Gait disturbance   . GERD (gastroesophageal reflux disease)   . Headache(784.0)   . Herniated lumbar disc without myelopathy 08/20/2016  . History of osteoporosis 05/25/2007   DEXA 2014 Since the baseline examination in 2002, there has been a  statistically significant 19.5% increase in bone mineral density in  the lumbar spine and a statistically significant 14.6% increase in  bone mineral density in the left femur.   DEXA 2002 with osteoporosis of lumbar spine and moderate osteopenia of left hip MRI 2002 left hip negative for avascular necrosis  Qualifier: Diagnosis o  . HTN (hypertension)   . Hypothyroidism   . Insomnia 01/02/2015  . Insomnia 01/02/2015  . Intermittent left-sided chest pain 01/26/2013  . Left sided numbness 12/10/2018  . Nephrolithiasis   . Obesity   . OSA (obstructive sleep apnea)    last test- many yrs. ago, done at [facility], no CPAP in use  . Osteoporosis   . Pain and swelling of lower leg, left 12/16/2017  . Peripheral neuropathy   . Prolonged QT interval   . 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  Physical Exam:  Vitals:   03/21/21 1506  BP: 139/78  Pulse: 79  Temp: 98.2 F (36.8 C)  TempSrc: Oral  SpO2: 100%  Weight:  212 lb 14.4 oz (96.6 kg)  Height: 5\' 8"  (1.727 m)   Physical Exam  Constitutional: Appears well-developed and well-nourished. No distress.  HENT: Normocephalic and atraumatic, EOMI Cardiovascular: Normal rate, regular rhythm, S1 and S2 present, no murmurs, rubs, gallops.  Distal pulses intact Respiratory: No respiratory distress, Lungs are clear to auscultation bilaterally. Musculoskeletal: Normal bulk and tone. Neurological: Is alert and oriented x4, no apparent focal deficits noted. Skin: Warm and dry.  No rash, erythema, lesions noted.  Assessment & Plan:   See Encounters Tab for problem based charting.  Patient discussed with Dr. Evette Doffing

## 2021-03-22 NOTE — Assessment & Plan Note (Signed)
This is stable on current regimen of lisinopril 20mg  daily and carvedilol 6.25mg  twice daily. She was previously following with Dr Stanford Breed but has not recently seen him. Patient had cardiac MRI in February 2021 to evaluate for  infiltrative or inflammatory cardiomyopathy, no evidence for cardiac sarcoidosis. Findings consistent with non-ischemic dilated cardiomyopathy.   Plan: Continue current regimen with lisinopril and coreg

## 2021-03-22 NOTE — Assessment & Plan Note (Signed)
Stable on synthroid 173mcg daily.

## 2021-03-22 NOTE — Assessment & Plan Note (Signed)
Patient follows [facility] fund[facility] for this. She is currently on low-dose methotrexate. Plan is for repeat brain imaging in 2 months.  CBC and CMP at this visit wnl.   Plan: Continue low-dose methotrexate Follow up with neurology

## 2021-03-25 NOTE — Progress Notes (Signed)
Internal Medicine Clinic Attending ° °Case discussed with Dr. Aslam  At the time of the visit.  We reviewed the resident’s history and exam and pertinent patient test results.  I agree with the assessment, diagnosis, and plan of care documented in the resident’s note.  °

## 2021-03-28 DIAGNOSIS — M75112 Incomplete rotator cuff tear or rupture of left shoulder, not specified as traumatic: Secondary | ICD-10-CM | POA: Diagnosis not present

## 2021-03-29 ENCOUNTER — Other Ambulatory Visit: Payer: Self-pay | Admitting: Internal Medicine

## 2021-03-29 DIAGNOSIS — E119 Type 2 diabetes mellitus without complications: Secondary | ICD-10-CM

## 2021-03-31 DIAGNOSIS — Z419 Encounter for procedure for purposes other than remedying health state, unspecified: Secondary | ICD-10-CM | POA: Diagnosis not present

## 2021-04-02 DIAGNOSIS — G894 Chronic pain syndrome: Secondary | ICD-10-CM | POA: Diagnosis not present

## 2021-04-02 DIAGNOSIS — M25512 Pain in left shoulder: Secondary | ICD-10-CM | POA: Diagnosis not present

## 2021-04-02 DIAGNOSIS — M961 Postlaminectomy syndrome, not elsewhere classified: Secondary | ICD-10-CM | POA: Diagnosis not present

## 2021-04-02 DIAGNOSIS — G8929 Other chronic pain: Secondary | ICD-10-CM | POA: Diagnosis not present

## 2021-04-02 DIAGNOSIS — M79662 Pain in left lower leg: Secondary | ICD-10-CM | POA: Diagnosis not present

## 2021-04-02 DIAGNOSIS — M5136 Other intervertebral disc degeneration, lumbar region: Secondary | ICD-10-CM | POA: Diagnosis not present

## 2021-04-03 ENCOUNTER — Telehealth: Payer: Self-pay

## 2021-04-03 DIAGNOSIS — E119 Type 2 diabetes mellitus without complications: Secondary | ICD-10-CM

## 2021-04-03 MED ORDER — TRULICITY 0.75 MG/0.5ML ~~LOC~~ SOAJ: 0.7500 mg | SUBCUTANEOUS | 3 refills | Status: DC

## 2021-04-03 NOTE — Telephone Encounter (Signed)
Received fax from [facility] Trulicity [facility] written by Dr. Truman Hayward on 03/29/21, states Dr. Truman Hayward not covered under medicaid.  Will resend to yellow team and attendings to please resend. Thank you, SChaplin, RN,BSN

## 2021-04-04 ENCOUNTER — Other Ambulatory Visit: Payer: Self-pay | Admitting: *Deleted

## 2021-04-04 DIAGNOSIS — E119 Type 2 diabetes mellitus without complications: Secondary | ICD-10-CM

## 2021-04-04 MED ORDER — TRULICITY 0.75 MG/0.5ML ~~LOC~~ SOAJ: 0.7500 mg | SUBCUTANEOUS | 3 refills | Status: DC

## 2021-04-04 NOTE — Telephone Encounter (Addendum)
Received fax from [facility]. Stated received Trulicity rx from Dr Barnet Glasgow who is not medicaid approved. I will ask The Attending to re-send rx. Thanks

## 2021-04-05 ENCOUNTER — Encounter: Payer: Medicaid Other | Admitting: Internal Medicine

## 2021-04-20 DIAGNOSIS — M25512 Pain in left shoulder: Secondary | ICD-10-CM | POA: Diagnosis not present

## 2021-04-24 ENCOUNTER — Encounter: Payer: Self-pay | Admitting: *Deleted

## 2021-04-24 DIAGNOSIS — M75112 Incomplete rotator cuff tear or rupture of left shoulder, not specified as traumatic: Secondary | ICD-10-CM | POA: Diagnosis not present

## 2021-05-01 DIAGNOSIS — Z419 Encounter for procedure for purposes other than remedying health state, unspecified: Secondary | ICD-10-CM | POA: Diagnosis not present

## 2021-05-02 DIAGNOSIS — G894 Chronic pain syndrome: Secondary | ICD-10-CM | POA: Diagnosis not present

## 2021-05-02 DIAGNOSIS — M79662 Pain in left lower leg: Secondary | ICD-10-CM | POA: Diagnosis not present

## 2021-05-02 DIAGNOSIS — M961 Postlaminectomy syndrome, not elsewhere classified: Secondary | ICD-10-CM | POA: Diagnosis not present

## 2021-05-02 DIAGNOSIS — M25512 Pain in left shoulder: Secondary | ICD-10-CM | POA: Diagnosis not present

## 2021-05-02 DIAGNOSIS — M5136 Other intervertebral disc degeneration, lumbar region: Secondary | ICD-10-CM | POA: Diagnosis not present

## 2021-05-02 DIAGNOSIS — G8929 Other chronic pain: Secondary | ICD-10-CM | POA: Diagnosis not present

## 2021-05-11 ENCOUNTER — Other Ambulatory Visit: Payer: Self-pay | Admitting: Neurology

## 2021-05-11 ENCOUNTER — Other Ambulatory Visit: Payer: Self-pay | Admitting: Internal Medicine

## 2021-05-11 DIAGNOSIS — E119 Type 2 diabetes mellitus without complications: Secondary | ICD-10-CM

## 2021-05-13 ENCOUNTER — Other Ambulatory Visit: Payer: Self-pay

## 2021-05-13 ENCOUNTER — Ambulatory Visit (INDEPENDENT_AMBULATORY_CARE_PROVIDER_SITE_OTHER): Payer: Medicaid Other | Admitting: Obstetrics and Gynecology

## 2021-05-13 ENCOUNTER — Encounter: Payer: Self-pay | Admitting: Obstetrics and Gynecology

## 2021-05-13 VITALS — BP 136/78 | HR 88 | Ht 68.0 in | Wt 208.0 lb

## 2021-05-13 DIAGNOSIS — Z90710 Acquired absence of both cervix and uterus: Secondary | ICD-10-CM | POA: Insufficient documentation

## 2021-05-13 DIAGNOSIS — Z5941 Food insecurity: Secondary | ICD-10-CM | POA: Diagnosis not present

## 2021-05-13 DIAGNOSIS — N816 Rectocele: Secondary | ICD-10-CM | POA: Diagnosis not present

## 2021-05-13 DIAGNOSIS — Z01419 Encounter for gynecological examination (general) (routine) without abnormal findings: Secondary | ICD-10-CM

## 2021-05-13 NOTE — Progress Notes (Signed)
Next MMG due 07/2021.

## 2021-05-13 NOTE — Progress Notes (Signed)
GYNECOLOGY ANNUAL PREVENTATIVE CARE ENCOUNTER NOTE  History:     [name] [name] is a 55 y.o. [phone] female here for a routine annual gynecologic exam.  Current complaints: constipation and issues form" rectocele".   Denies abnormal vaginal bleeding, discharge, pelvic pain, problems with intercourse or other gynecologic concerns.    Gynecologic History Patient's last menstrual period was 12/03/2011. Contraception: status post hysterectomy Last Pap: 2013. Results were: normal per pt  Last mammogram: 07/20/2020. Results were: normal  Obstetric History OB History  Gravida Para Term Preterm AB Living  _0 0 4  SAB IAB Ectopic Multiple Live Births  0 0 0 0 5    # Outcome Date GA Lbr Len/2nd Weight Sex Delivery Anes PTL Lv  5 Term 11/06/92 [redacted]w[redacted]d  M CS-LTranv   LIV  4 Term 09/14/87 323w0d M    LIV  3 Preterm 05/13/86 [id number] CS-LTranv   LIV  2 Preterm 05/04/85 [id number] CS-LTranv   LIV  1 Preterm 1985    F CS-LTranv   ND    Past Medical History:  Diagnosis Date   Abdominal pain 06/25/2010   CT abd/pelvis 05/2014: Very large amount of stool noted distending the colon. The size consistent with severe constipation/fecal impaction.     Abnormal cervical Papanicolaou smear 04/17/2017   Anemia    Arthritis    lumbar region- radiculopathy   Cancer (HCC)[id number]    Thyroid Ca-thyroidectomy   Chronic fatigue    DM (diabetes mellitus) (HCC)    Gait disturbance    GERD (gastroesophageal reflux disease)    Headache(784.0)    Herniated lumbar disc without myelopathy 08/20/2016   History of osteoporosis 05/25/2007   DEXA 2014 Since the baseline examination in 2002, there has been a  statistically significant 19.5% increase in bone mineral density in  the lumbar spine and a statistically significant 14.6% increase in  bone mineral density in the left femur.   DEXA 2002 with osteoporosis of lumbar spine and moderate osteopenia of left hip MRI 2002 left hip negative for avascular  necrosis  Qualifier: Diagnosis o   HTN (hypertension)    Hypothyroidism    Insomnia 01/02/2015   Insomnia 01/02/2015   Intermittent left-sided chest pain 01/26/2013   Left sided numbness 12/10/2018   Nephrolithiasis    Obesity    OSA (obstructive sleep apnea)    last test- many yrs. 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, treated with surgery    Past Surgical History:  Procedure Laterality Date   BRAIN SURGERY  2001   for sarcoidosis   BREAST BIOPSY Left    BREAST BIOPSY Right    BREAST EXCISIONAL BIOPSY Right    x2 benign   BREAST EXCISIONAL BIOPSY Left    benign   BREAST SURGERY Bilateral    biopsies    DILATION AND CURETTAGE OF UTERUS     HYSTEROSCOPY     LUMBAR LAMINECTOMY/DECOMPRESSION MICRODISCECTOMY Left 08/20/2016   Procedure: Left Lumbar four-five Microdiskectomy;  Surgeon: ErneLeeroy Cha;  Location: [facility];  Service: Neurosurgery;  Laterality: Left;   PARTIAL HYSTERECTOMY  2013   ROTATOR CUFF REPAIR Right    07/2013   Suboccipital  craniotomy     THYROIDECTOMY     Precancerous lesion    Current Outpatient Medications on File Prior to Visit  Medication Sig Dispense Refill   ACCU-CHEK GUIDE test strip USE AS INSTRUCTED TO CHECK BLOOD SUGAR UP TO 1 TIME A DAY 100 strip 3   albuterol (VENTOLIN HFA) 108 (90 Base) MCG/ACT inhaler Inhale 2 puffs into the lungs every 6 (six) hours as needed for wheezing or shortness of breath. 18 g 1   aspirin 81 MG chewable tablet Chew 81 mg by mouth daily.     atorvastatin (LIPITOR) 20 MG tablet Take 1 tablet (20 mg total) by mouth daily. 90 tablet 3   B-D ULTRAFINE III SHORT PEN 31G X 8 MM MISC USE TO INJECT INSULIN 1 TIME DAILY DIAG CODE E11.9 INSULIN DEPENDENT 100 each 1   blood glucose meter kit and supplies KIT Dispense based on patient and  insurance preference. Use up to four times daily as directed. (FOR ICD-9 250.00, 250.01). 1 each 0   carvedilol (COREG) 6.25 MG tablet Take 1 tablet (6.25 mg total) by mouth 2 (two) times daily. 180 tablet 3   diazepam (VALIUM) 5 MG tablet Take 1 tablet one hour prior to scan 2 tablet 0   Dulaglutide (TRULICITY) 5.63 JS/9.7WY SOPN Inject 0.75 mg into the skin once a week. 2 mL 3   DULoxetine (CYMBALTA) 30 MG capsule duloxetine 30 mg capsule,delayed release  TAKE 1 CAPSULE BY MOUTH EVERY DAY     empagliflozin (JARDIANCE) 10 MG TABS tablet Take 1 tablet (10 mg total) by mouth daily. 90 tablet 3   Erenumab-aooe (AIMOVIG) 140 MG/ML SOAJ Inject 140 mg into the skin every 30 (thirty) days. 1.12 mg 4   fluticasone (FLONASE) 50 MCG/ACT nasal spray PLACE 1 SPRAY INTO BOTH NOSTRILS DAILY. 48 mL 1   folic acid (FOLVITE) 1 MG tablet TAKE 1 TABLET BY MOUTH EVERY DAY 90 tablet 3   levothyroxine (SYNTHROID) 137 MCG tablet TAKE 1 TABLET BY MOUTH EVERY DAY BEFORE BREAKFAST 90 tablet 1   losartan (COZAAR) 50 MG tablet TAKE 1 TABLET BY MOUTH EVERY DAY 90 tablet 1   metFORMIN (GLUCOPHAGE) 1000 MG tablet TAKE 1 TABLET BY MOUTH TWICE A DAY WITH MEALS 180 tablet 1   Multiple Vitamin (MULTIVITAMIN WITH MINERALS) TABS tablet Take 1 tablet by mouth every evening.      oxyCODONE-acetaminophen (PERCOCET) 10-325 MG tablet Take 1 tablet by mouth every 4 (four) hours as needed for pain.     pantoprazole (PROTONIX) 20 MG tablet Take 1 tablet (20 mg total) by mouth daily. 90 tablet 1   No current facility-administered medications on file prior to visit.    Allergies  Allergen Reactions   Lisinopril Cough    Social History:  reports that she has never smoked. She has never used smokeless tobacco. She reports that she does not drink alcohol and does not use drugs.  Family History  Problem Relation Age of Onset   Heart disease Mother        questionable CAD and arrythmias    Cancer Mother        Breast cancer   Breast  cancer Mother 81   Venous thrombosis Sister    Diabetes Sister    Heart disease Sister    Diabetes Brother    Heart disease Brother    Cancer Maternal Aunt    Breast cancer Maternal Aunt    Venous thrombosis Sister    Diabetes Sister  Heart disease Sister     The following portions of the patient's history were reviewed and updated as appropriate: allergies, current medications, past family history, past medical history, past social history, past surgical history and problem list.  Review of Systems Pertinent items noted in HPI and remainder of comprehensive ROS otherwise negative.  Physical Exam:  BP 136/78   Pulse 88   Ht _0  (1.727 m)   Wt 208 lb (94.3 kg)   LMP 12/03/2011   BMI 31.63 kg/m  CONSTITUTIONAL: Well-developed, well-nourished female in no acute distress.  HENT:  Normocephalic, atraumatic, External right and left ear normal. Oropharynx is clear and moist EYES: Conjunctivae and EOM are normal.  NECK: Normal range of motion, supple, no masses.  Normal thyroid.  SKIN: Skin is warm and dry. No rash noted. Not diaphoretic. No erythema. No pallor. MUSCULOSKELETAL: Normal range of motion. No tenderness.  No cyanosis, clubbing, or edema.  2+ distal pulses. NEUROLOGIC: Alert and oriented to person, place, and time. Normal reflexes, muscle tone coordination.  PSYCHIATRIC: Normal mood and affect. Normal behavior. Normal judgment and thought content. CARDIOVASCULAR: Normal heart rate noted, regular rhythm RESPIRATORY: Clear to auscultation bilaterally. Effort and breath sounds normal, no problems with respiration noted. BREASTS: Symmetric in size. No masses, tenderness, skin changes, nipple drainage, or lymphadenopathy bilaterally. Performed in the presence of a chaperone. ABDOMEN: Soft, no distention noted.  No tenderness, rebound or guarding.  PELVIC: Normal appearing external genitalia and urethral meatus; normal appearing vaginal mucosa .  No abnormal discharge noted.     No adnexal tenderness.  Vaginal cuff intact with no defects.  Np taken as pt is s/p hysterectomy.Performed in the presence of a chaperone.   Assessment and Plan:    1. Food insecurity  - AMBULATORY REFERRAL TO [place] [facility]  2. Women's annual routine gynecological examination Routine exam  3. History of abdominal hysterectomy   4. Rectocele Reviewed care everywhere notes form [facility].  Pt was supposed to be referred to urogynecology at [facility].  This never happened.  Review of defecogram showed anterior rectocele.  Will refer to urogyn. - Ambulatory referral to Urogynecology   Pt will schedule mammogram 07/2021. F/u in 1 year or prn Routine preventative health maintenance measures emphasized.  Pt is current on colonoscopy 3 years ago. Please refer to After Visit Summary for other counseling recommendations.      Lynnda Shields, MD, [place] for [facility], [place]

## 2021-05-23 ENCOUNTER — Telehealth: Payer: Self-pay

## 2021-05-23 ENCOUNTER — Encounter: Payer: Self-pay | Admitting: Neurology

## 2021-05-23 ENCOUNTER — Ambulatory Visit: Payer: Medicaid Other | Admitting: Neurology

## 2021-05-23 VITALS — BP 158/86 | HR 89 | Ht 68.0 in | Wt 208.8 lb

## 2021-05-23 DIAGNOSIS — D8689 Sarcoidosis of other sites: Secondary | ICD-10-CM | POA: Diagnosis not present

## 2021-05-23 DIAGNOSIS — Z5181 Encounter for therapeutic drug level monitoring: Secondary | ICD-10-CM

## 2021-05-23 NOTE — Telephone Encounter (Signed)
Attempt made to contact [name] [name] is a 55 y.o. female re: New Patient appointment with Dr. Wannetta Sender. Pt was not available.  LM on the VM for the patient to call me back.

## 2021-05-23 NOTE — Progress Notes (Signed)
Reason for visit: Migraine headache, neurosarcoidosis  [name] [name] is an 55 y.o. female  History of present illness:  [name] [name] is a 55 year old right-handed black female with a history of neurosarcoidosis diagnosed many years ago, the patient has been very stable in this regard for quite a number of years.  The patient was to come off of the methotrexate and last seen, but she apparently felt that her headaches were worse off the medication and restarted the drug on her own.  She takes 2.5 mg once a week.  The patient has had some left shoulder discomfort, she is to have surgery next week for rotator cuff tear.  The patient has been seen for sleep evaluation, she has never set up the sleep evaluation as ordered.  She is planning on doing this after her shoulder surgery.  She continues to have very frequent headaches, at least 15 headache days a month.  Aimovig helped initially but has not been effective more recently.  I recommended Botox therapy, but the patient is not sure that she wants to have this done.  Her last hemoglobin A1c was 6.1.  She returns to the office today for an evaluation.  Past Medical History:  Diagnosis Date   Abdominal pain 06/25/2010   CT abd/pelvis 05/2014: Very large amount of stool noted distending the colon. The size consistent with severe constipation/fecal impaction.     Abnormal cervical Papanicolaou smear 04/17/2017   Anemia    Arthritis    lumbar region- radiculopathy   Cancer ([place]) 2013    Thyroid Ca-thyroidectomy   Chronic fatigue    DM (diabetes mellitus) (HCC)    Gait disturbance    GERD (gastroesophageal reflux disease)    Headache(784.0)    Herniated lumbar disc without myelopathy 08/20/2016   History of osteoporosis 05/25/2007   DEXA 2014 Since the baseline examination in 2002, there has been a  statistically significant 19.5% increase in bone mineral density in  the lumbar spine and a statistically significant 14.6% increase in  bone  mineral density in the left femur.   DEXA 2002 with osteoporosis of lumbar spine and moderate osteopenia of left hip MRI 2002 left hip negative for avascular necrosis  Qualifier: Diagnosis o   HTN (hypertension)    Hypothyroidism    Insomnia 01/02/2015   Insomnia 01/02/2015   Intermittent left-sided chest pain 01/26/2013   Left sided numbness 12/10/2018   Nephrolithiasis    Obesity    OSA (obstructive sleep apnea)    last test- many yrs. 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, treated with surgery    Past Surgical History:  Procedure Laterality Date   BRAIN SURGERY  2001   for sarcoidosis   BREAST BIOPSY Left    BREAST BIOPSY Right    BREAST EXCISIONAL BIOPSY Right    x2 benign   BREAST EXCISIONAL BIOPSY Left    benign   BREAST SURGERY Bilateral    biopsies    DILATION AND CURETTAGE OF UTERUS     HYSTEROSCOPY     LUMBAR LAMINECTOMY/DECOMPRESSION MICRODISCECTOMY Left 08/20/2016   Procedure: Left Lumbar four-five Microdiskectomy;  Surgeon: Leeroy Cha, MD;  Location: [facility];  Service: Neurosurgery;  Laterality: Left;   PARTIAL HYSTERECTOMY  2013  ROTATOR CUFF REPAIR Right    07/2013   Suboccipital craniotomy     THYROIDECTOMY     Precancerous lesion    Family History  Problem Relation Age of Onset   Heart disease Mother        questionable CAD and arrythmias    Cancer Mother        Breast cancer   Breast cancer Mother 70   Venous thrombosis Sister    Diabetes Sister    Heart disease Sister    Diabetes Brother    Heart disease Brother    Cancer Maternal Aunt    Breast cancer Maternal Aunt    Venous thrombosis Sister    Diabetes Sister    Heart disease Sister     Social history:  reports that she has never smoked. She has never used smokeless tobacco. She reports that she  does not drink alcohol and does not use drugs.    Allergies  Allergen Reactions   Lisinopril Cough    Medications:  Prior to Admission medications   Medication Sig Start Date End Date Taking? Authorizing Provider  ACCU-CHEK GUIDE test strip USE AS INSTRUCTED TO CHECK BLOOD SUGAR UP TO 1 TIME A DAY 05/13/21  Yes Sanjuan Dame, MD  albuterol (VENTOLIN HFA) 108 (90 Base) MCG/ACT inhaler Inhale 2 puffs into the lungs every 6 (six) hours as needed for wheezing or shortness of breath. 05/16/20  Yes Earlene Plater, MD  aspirin 81 MG chewable tablet Chew 81 mg by mouth daily.   Yes [provider]  atorvastatin (LIPITOR) 20 MG tablet Take 1 tablet (20 mg total) by mouth daily. 08/30/20  Yes Gaylan Gerold, DO  B-D ULTRAFINE III SHORT PEN 31G X 8 MM MISC USE TO INJECT INSULIN 1 TIME DAILY DIAG CODE E11.9 INSULIN DEPENDENT 03/12/21  Yes Aslam, Loralyn Freshwater, MD  blood glucose meter kit and supplies KIT Dispense based on patient and insurance preference. Use up to four times daily as directed. (FOR ICD-9 250.00, 250.01). 06/15/19  Yes Agyei, Obed K, MD  carvedilol (COREG) 6.25 MG tablet Take 1 tablet (6.25 mg total) by mouth 2 (two) times daily. 12/21/19  Yes Lelon Perla, MD  Dulaglutide (TRULICITY) 2.11 ZN/3.5AP SOPN Inject 0.75 mg into the skin once a week. 04/04/21  Yes Aldine Contes, MD  DULoxetine (CYMBALTA) 30 MG capsule duloxetine 30 mg capsule,delayed release  TAKE 1 CAPSULE BY MOUTH EVERY DAY   Yes [provider]  empagliflozin (JARDIANCE) 10 MG TABS tablet Take 1 tablet (10 mg total) by mouth daily. 02/19/21  Yes Axel Filler, MD  Erenumab-aooe (AIMOVIG) 140 MG/ML SOAJ Inject 140 mg into the skin every 30 (thirty) days. 05/09/20  Yes Kathrynn Ducking, MD  fluticasone (FLONASE) 50 MCG/ACT nasal spray PLACE 1 SPRAY INTO BOTH NOSTRILS DAILY. 05/13/21 05/13/22 Yes Sanjuan Dame, MD  folic acid (FOLVITE) 1 MG tablet TAKE 1 TABLET BY MOUTH EVERY DAY 02/04/21  Yes Gaylan Gerold, DO  levothyroxine (SYNTHROID) 137 MCG tablet TAKE 1 TABLET BY MOUTH EVERY DAY BEFORE BREAKFAST 08/23/20  Yes Aldine Contes, MD  losartan (COZAAR) 50 MG tablet TAKE 1 TABLET BY MOUTH EVERY DAY 02/15/21  Yes Sanjuan Dame, MD  metFORMIN (GLUCOPHAGE) 1000 MG tablet TAKE 1 TABLET BY MOUTH TWICE A DAY WITH MEALS 03/19/21  Yes Mosetta Anis, MD  Multiple Vitamin (MULTIVITAMIN WITH MINERALS) TABS tablet Take 1 tablet by mouth every evening.    Yes [provider]  oxyCODONE-acetaminophen (PERCOCET) 10-325 MG tablet Take  1 tablet by mouth every 4 (four) hours as needed for pain.   Yes [provider]  pantoprazole (PROTONIX) 20 MG tablet Take 1 tablet (20 mg total) by mouth daily. 08/29/20  Yes Gaylan Gerold, DO  diazepam (VALIUM) 5 MG tablet Take 1 tablet one hour prior to scan 12/21/19   Lelon Perla, MD    ROS:  Out of a complete 14 system review of symptoms, the patient complains only of the following symptoms, and all other reviewed systems are negative.  Headache Left shoulder pain   Blood pressure (!) 158/86, pulse 89, height _0  (1.727 m), weight 208 lb 12.8 oz (94.7 kg), last menstrual period 12/03/2011.  Physical Exam  General: The patient is alert and cooperative at the time of the examination.  The patient is moderately obese.  Skin: No significant peripheral edema is noted.   Neurologic Exam  Mental status: The patient is alert and oriented x 3 at the time of the examination. The patient has apparent normal recent and remote memory, with an apparently normal attention span and concentration ability.   Cranial nerves: Facial symmetry is present. Speech is normal, no aphasia or dysarthria is noted. Extraocular movements are full. Visual fields are full.  Motor: The patient has good strength in all 4 extremities.  Sensory examination: Soft touch sensation is symmetric on the face, arms, and legs.  Coordination: The patient has good  finger-nose-finger and heel-to-shin bilaterally.  Gait and station: The patient has a normal gait. Tandem gait is unsteady. Romberg is positive, the patient goes backwards. No drift is seen.  Reflexes: Deep tendon reflexes are symmetric.   Assessment/Plan:  1.  Neurosarcoidosis, stable  2.  Migraine headache, intractable  I have once again recommended Botox therapy, the patient will "think about it".  I do think that she should be able to come off of the methotrexate, she is on an extremely low dose, and her neurosarcoidosis has been quite stable.  She will try to stop the medication again.  We will plan on doing an MRI of the brain after she recovers from her surgery.  She will get a sleep study in the future.  She will follow-up here in 6 months, she may be followed by Dr. Jaynee Eagles.  Jill Alexanders MD 05/23/2021 2:41 PM  [facility] [address] [place], [place] [address]  Phone [phone] Fax [phone]

## 2021-05-24 DIAGNOSIS — M5136 Other intervertebral disc degeneration, lumbar region: Secondary | ICD-10-CM | POA: Diagnosis not present

## 2021-05-24 DIAGNOSIS — M961 Postlaminectomy syndrome, not elsewhere classified: Secondary | ICD-10-CM | POA: Diagnosis not present

## 2021-05-24 DIAGNOSIS — G894 Chronic pain syndrome: Secondary | ICD-10-CM | POA: Diagnosis not present

## 2021-05-24 DIAGNOSIS — M79662 Pain in left lower leg: Secondary | ICD-10-CM | POA: Diagnosis not present

## 2021-05-24 DIAGNOSIS — M25512 Pain in left shoulder: Secondary | ICD-10-CM | POA: Diagnosis not present

## 2021-05-24 DIAGNOSIS — G8929 Other chronic pain: Secondary | ICD-10-CM | POA: Diagnosis not present

## 2021-05-24 LAB — COMPREHENSIVE METABOLIC PANEL
ALT: 19 IU/L (ref 0–32)
AST: 23 IU/L (ref 0–40)
Albumin/Globulin Ratio: 1.6 (ref 1.2–2.2)
Albumin: 4.5 g/dL (ref 3.8–4.9)
Alkaline Phosphatase: 71 IU/L (ref 44–121)
BUN/Creatinine Ratio: 23 (ref 9–23)
BUN: 17 mg/dL (ref 6–24)
Bilirubin Total: 0.2 mg/dL (ref 0.0–1.2)
CO2: 23 mmol/L (ref 20–29)
Calcium: 9.4 mg/dL (ref 8.7–10.2)
Chloride: 103 mmol/L (ref 96–106)
Creatinine, Ser: 0.75 mg/dL (ref 0.57–1.00)
Globulin, Total: 2.8 g/dL (ref 1.5–4.5)
Glucose: 77 mg/dL (ref 65–99)
Potassium: 4.9 mmol/L (ref 3.5–5.2)
Sodium: 143 mmol/L (ref 134–144)
Total Protein: 7.3 g/dL (ref 6.0–8.5)
eGFR: 95 mL/min/{1.73_m2} (ref >59)

## 2021-05-24 LAB — CBC WITH DIFFERENTIAL/PLATELET
Basophils Absolute: 0 10*3/uL (ref 0.0–0.2)
Basos: 1 %
EOS (ABSOLUTE): 0.1 10*3/uL (ref 0.0–0.4)
Eos: 3 %
Hematocrit: 39.5 % (ref 34.0–46.6)
Hemoglobin: 12.3 g/dL (ref 11.1–15.9)
Immature Grans (Abs): 0 10*3/uL (ref 0.0–0.1)
Immature Granulocytes: 0 %
Lymphocytes Absolute: 2.2 10*3/uL (ref 0.7–3.1)
Lymphs: 48 %
MCH: 26.7 pg (ref 26.6–33.0)
MCHC: 31.1 g/dL — ABNORMAL LOW (ref 31.5–35.7)
MCV: 86 fL (ref 79–97)
Monocytes Absolute: 0.4 10*3/uL (ref 0.1–0.9)
Monocytes: 9 %
Neutrophils Absolute: 1.8 10*3/uL (ref 1.4–7.0)
Neutrophils: 39 %
Platelets: 333 10*3/uL (ref 150–450)
RBC: 4.61 x10E6/uL (ref 3.77–5.28)
RDW: 13.4 % (ref 11.7–15.4)
WBC: 4.7 10*3/uL (ref 3.4–10.8)

## 2021-05-27 DIAGNOSIS — X58XXXA Exposure to other specified factors, initial encounter: Secondary | ICD-10-CM | POA: Diagnosis not present

## 2021-05-27 DIAGNOSIS — M7552 Bursitis of left shoulder: Secondary | ICD-10-CM | POA: Diagnosis not present

## 2021-05-27 DIAGNOSIS — Y999 Unspecified external cause status: Secondary | ICD-10-CM | POA: Diagnosis not present

## 2021-05-27 DIAGNOSIS — S46012A Strain of muscle(s) and tendon(s) of the rotator cuff of left shoulder, initial encounter: Secondary | ICD-10-CM | POA: Diagnosis not present

## 2021-05-27 DIAGNOSIS — S43432A Superior glenoid labrum lesion of left shoulder, initial encounter: Secondary | ICD-10-CM | POA: Diagnosis not present

## 2021-05-27 DIAGNOSIS — G8918 Other acute postprocedural pain: Secondary | ICD-10-CM | POA: Diagnosis not present

## 2021-05-31 DIAGNOSIS — Z419 Encounter for procedure for purposes other than remedying health state, unspecified: Secondary | ICD-10-CM | POA: Diagnosis not present

## 2021-06-03 ENCOUNTER — Other Ambulatory Visit: Payer: Self-pay | Admitting: Neurology

## 2021-06-04 ENCOUNTER — Encounter: Payer: Self-pay | Admitting: *Deleted

## 2021-06-06 DIAGNOSIS — M25512 Pain in left shoulder: Secondary | ICD-10-CM | POA: Diagnosis not present

## 2021-06-07 ENCOUNTER — Emergency Department (HOSPITAL_COMMUNITY)
Admission: EM | Admit: 2021-06-07 | Discharge: 2021-06-08 | Disposition: A | Discharge status: Home / Self Care | Payer: Medicaid Other | Attending: Emergency Medicine | Admitting: Emergency Medicine

## 2021-06-07 DIAGNOSIS — E1142 Type 2 diabetes mellitus with diabetic polyneuropathy: Secondary | ICD-10-CM | POA: Diagnosis not present

## 2021-06-07 DIAGNOSIS — R1031 Right lower quadrant pain: Secondary | ICD-10-CM

## 2021-06-07 DIAGNOSIS — I10 Essential (primary) hypertension: Secondary | ICD-10-CM | POA: Insufficient documentation

## 2021-06-07 DIAGNOSIS — Z7982 Long term (current) use of aspirin: Secondary | ICD-10-CM | POA: Diagnosis not present

## 2021-06-07 DIAGNOSIS — Z79899 Other long term (current) drug therapy: Secondary | ICD-10-CM | POA: Diagnosis not present

## 2021-06-07 DIAGNOSIS — E039 Hypothyroidism, unspecified: Secondary | ICD-10-CM | POA: Insufficient documentation

## 2021-06-07 DIAGNOSIS — K5900 Constipation, unspecified: Secondary | ICD-10-CM

## 2021-06-07 DIAGNOSIS — Z7984 Long term (current) use of oral hypoglycemic drugs: Secondary | ICD-10-CM | POA: Diagnosis not present

## 2021-06-07 DIAGNOSIS — I700 Atherosclerosis of aorta: Secondary | ICD-10-CM | POA: Diagnosis not present

## 2021-06-07 DIAGNOSIS — Z85850 Personal history of malignant neoplasm of thyroid: Secondary | ICD-10-CM | POA: Diagnosis not present

## 2021-06-07 DIAGNOSIS — K219 Gastro-esophageal reflux disease without esophagitis: Secondary | ICD-10-CM | POA: Diagnosis not present

## 2021-06-07 DIAGNOSIS — Z90710 Acquired absence of both cervix and uterus: Secondary | ICD-10-CM | POA: Diagnosis not present

## 2021-06-07 DIAGNOSIS — R1030 Lower abdominal pain, unspecified: Secondary | ICD-10-CM | POA: Diagnosis not present

## 2021-06-07 LAB — CBC
HCT: 41.7 % (ref 36.0–46.0)
Hemoglobin: 12.5 g/dL (ref 12.0–15.0)
MCH: 26.9 pg (ref 26.0–34.0)
MCHC: 30 g/dL (ref 30.0–36.0)
MCV: 89.7 fL (ref 80.0–100.0)
Platelets: 416 10*3/uL — ABNORMAL HIGH (ref 150–400)
RBC: 4.65 MIL/uL (ref 3.87–5.11)
RDW: 14.2 % (ref 11.5–15.5)
WBC: 5.9 10*3/uL (ref 4.0–10.5)
nRBC: 0 % (ref 0.0–0.2)

## 2021-06-07 LAB — BASIC METABOLIC PANEL
Anion gap: 13 (ref 5–15)
BUN: 19 mg/dL (ref 6–20)
CO2: 23 mmol/L (ref 22–32)
Calcium: 9.2 mg/dL (ref 8.9–10.3)
Chloride: 106 mmol/L (ref 98–111)
Creatinine, Ser: 0.68 mg/dL (ref 0.44–1.00)
GFR, Estimated: >60 mL/min (ref >60)
Glucose, Bld: 113 mg/dL — ABNORMAL HIGH (ref 70–99)
Potassium: 4.2 mmol/L (ref 3.5–5.1)
Sodium: 142 mmol/L (ref 135–145)

## 2021-06-07 LAB — I-STAT BETA HCG BLOOD, ED (MC, WL, AP ONLY): I-stat hCG, quantitative: <5 m[IU]/mL (ref <5)

## 2021-06-07 NOTE — ED Triage Notes (Signed)
Pt came in with flank and inguinal painthat started three days ago. Pt states it is getting worse. Hx of kidney stones. Pt  has appendix

## 2021-06-07 NOTE — ED Notes (Signed)
Pt called in ED lobby for vitals assessment; no answer x2. [employer]

## 2021-06-07 NOTE — ED Provider Notes (Signed)
Emergency Medicine Provider Triage Evaluation Note  [name] [name] , a 55 y.o. female  was evaluated in triage.  Pt complains of R flank pain that started 3-4 days ago. Pain radiates down the rle. Denies chest pain, sob, nvd, urinary sxs  Review of Systems  Positive: Right flank pain Negative: chest pain, sob, nvd, urinary sxs  Physical Exam  BP 138/84 (BP Location: Left Arm)   Pulse 98   Temp 98.5 F (36.9 C) (Oral)   Resp 16   Ht 5\' 8"  (1.727 m)   Wt 92.1 kg   LMP 12/03/2011   SpO2 99%   BMI 30.87 kg/m  Gen:   Awake, no distress   Resp:  Normal effort  MSK:   Moves extremities without difficulty    Medical Decision Making  Medically screening exam initiated at 9:32 PM.  Appropriate orders placed.  [name] [name] was informed that the remainder of the evaluation will be completed by another provider, this initial triage assessment does not replace that evaluation, and the importance of remaining in the ED until their evaluation is complete.     Rodney Booze, PA-C 06/07/21 [id number]    Lennice Sites, DO 06/07/21 2352

## 2021-06-07 NOTE — ED Notes (Signed)
Pt called in ED lobby for vitals assessment; no answer x1. [employer]

## 2021-06-08 ENCOUNTER — Emergency Department (HOSPITAL_COMMUNITY): Payer: Medicaid Other

## 2021-06-08 DIAGNOSIS — K5900 Constipation, unspecified: Secondary | ICD-10-CM | POA: Diagnosis not present

## 2021-06-08 DIAGNOSIS — I700 Atherosclerosis of aorta: Secondary | ICD-10-CM | POA: Diagnosis not present

## 2021-06-08 DIAGNOSIS — R1030 Lower abdominal pain, unspecified: Secondary | ICD-10-CM | POA: Diagnosis not present

## 2021-06-08 DIAGNOSIS — Z90710 Acquired absence of both cervix and uterus: Secondary | ICD-10-CM | POA: Diagnosis not present

## 2021-06-08 LAB — URINALYSIS, ROUTINE W REFLEX MICROSCOPIC
Bilirubin Urine: NEGATIVE
Glucose, UA: >=500 mg/dL — AB
Hgb urine dipstick: NEGATIVE
Ketones, ur: 5 mg/dL — AB
Leukocytes,Ua: NEGATIVE
Nitrite: NEGATIVE
Protein, ur: NEGATIVE mg/dL
Specific Gravity, Urine: 1.029 (ref 1.005–1.030)
pH: 5 (ref 5.0–8.0)

## 2021-06-08 MED ORDER — SIMETHICONE 40 MG/0.6ML PO SUSP
40.0000 mg | Freq: Once | ORAL | Status: AC
  Administered 2021-06-08: 40 mg via ORAL
  Filled 2021-06-08: qty 0.6

## 2021-06-08 MED ORDER — PEG 3350-KCL-NABCB-NACL-NASULF 240 G PO SOLR: ORAL | 0 refills | Status: DC

## 2021-06-08 NOTE — ED Provider Notes (Signed)
[place] DEPT Provider Note   CSN: [id number] Arrival date & time: 06/07/21  2100     History Chief Complaint  Patient presents with   Flank Pain    [name] [name] is a 55 y.o. female with a history of neurosarcoidosis, anterior rectocele, diabetes mellitus type 2, HTN, hypothyroidism who presents emergency department with a chief complaint of right flank pain.    The patient endorses right flank pain that has been constant and worsening for the last 3 to 4 days.  She is unable to characterize the pain.  She states that it will intermittently radiate down into her right thigh.  No known aggravating or alleviating factors.  She denies back pain, numbness, weakness, nausea, vomiting, diarrhea, dysuria, hematuria, vaginal bleeding, discharge, fever, chills.  She has a history of chronic constipation and reports that she has been referred to GI at [facility], but has not yet been evaluated by their team as she may have a surgical problem.  The history is provided by the patient and medical records. No language interpreter was used.      Past Medical History:  Diagnosis Date   Abdominal pain 06/25/2010   CT abd/pelvis 05/2014: Very large amount of stool noted distending the colon. The size consistent with severe constipation/fecal impaction.     Abnormal cervical Papanicolaou smear 04/17/2017   Anemia    Arthritis    lumbar region- radiculopathy   Cancer ([place]) 2013    Thyroid Ca-thyroidectomy   Chronic fatigue    DM (diabetes mellitus) (HCC)    Gait disturbance    GERD (gastroesophageal reflux disease)    Headache(784.0)    Herniated lumbar disc without myelopathy 08/20/2016   History of osteoporosis 05/25/2007   DEXA 2014 Since the baseline examination in 2002, there has been a  statistically significant 19.5% increase in bone mineral density in  the lumbar spine and a statistically significant 14.6% increase in  bone mineral density in the left femur.    DEXA 2002 with osteoporosis of lumbar spine and moderate osteopenia of left hip MRI 2002 left hip negative for avascular necrosis  Qualifier: Diagnosis o   HTN (hypertension)    Hypothyroidism    Insomnia 01/02/2015   Insomnia 01/02/2015   Intermittent left-sided chest pain 01/26/2013   Left sided numbness 12/10/2018   Nephrolithiasis    Obesity    OSA (obstructive sleep apnea)    last test- many yrs. 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   Chronic constipation 08/24/2019   Gluteal pain 05/24/2019   Chronic left shoulder pain 01/30/2019   Obesity 11/24/2016   Healthcare maintenance 02/18/2016   Common migraine with intractable migraine 02/09/2013   Hypothyroidism 05/08/2011   Hyperlipidemia 01/28/2010   Sciatica of left side associated with disorder of lumbar spine 10/20/2007   Neurosarcoidosis ([place]) 05/25/2007   Controlled diabetes mellitus type 2, ([place]) 05/25/2007   OBSTRUCTIVE SLEEP APNEA 05/25/2007   Essential hypertension 05/25/2007  Gastroesophageal reflux disease 05/25/2007   LONG QT SYNDROME 05/25/2007    Past Surgical History:  Procedure Laterality Date   BRAIN SURGERY  2001   for sarcoidosis   BREAST BIOPSY Left    BREAST BIOPSY Right    BREAST EXCISIONAL BIOPSY Right    x2 benign   BREAST EXCISIONAL BIOPSY Left    benign   BREAST SURGERY Bilateral    biopsies    DILATION AND CURETTAGE OF UTERUS     HYSTEROSCOPY     LUMBAR LAMINECTOMY/DECOMPRESSION MICRODISCECTOMY Left 08/20/2016    Procedure: Left Lumbar four-five Microdiskectomy;  Surgeon: Leeroy Cha, MD;  Location: [facility];  Service: Neurosurgery;  Laterality: Left;   PARTIAL HYSTERECTOMY  2013   ROTATOR CUFF REPAIR Right    07/2013   Suboccipital craniotomy     THYROIDECTOMY     Precancerous lesion     OB History     Gravida  5   Para  5   Term  2   Preterm  3   AB  0   Living  4      SAB  0   IAB  0   Ectopic  0   Multiple  0   Live Births  5           Family History  Problem Relation Age of Onset   Heart disease Mother        questionable CAD and arrythmias    Cancer Mother        Breast cancer   Breast cancer Mother 29   Venous thrombosis Sister    Diabetes Sister    Heart disease Sister    Diabetes Brother    Heart disease Brother    Cancer Maternal Aunt    Breast cancer Maternal Aunt    Venous thrombosis Sister    Diabetes Sister    Heart disease Sister     Social History   Tobacco Use   Smoking status: Never   Smokeless tobacco: Never  Vaping Use   Vaping Use: Never used  Substance Use Topics   Alcohol use: No    Alcohol/week: 0.0 standard drinks   Drug use: No    Home Medications Prior to Admission medications   Medication Sig Start Date End Date Taking? Authorizing Provider  polyethylene glycol (COLYTE) 240 g solution Drink 8 ounces every 10 minutes until the entire solution is consumed 06/08/21  Yes ,  A, PA-C  ACCU-CHEK GUIDE test strip USE AS INSTRUCTED TO CHECK BLOOD SUGAR UP TO 1 TIME A DAY 05/13/21   Sanjuan Dame, MD  albuterol (VENTOLIN HFA) 108 (90 Base) MCG/ACT inhaler Inhale 2 puffs into the lungs every 6 (six) hours as needed for wheezing or shortness of breath. 05/16/20   Earlene Plater, MD  aspirin 81 MG chewable tablet Chew 81 mg by mouth daily.    [provider]  atorvastatin (LIPITOR) 20 MG tablet Take 1 tablet (20 mg total) by mouth daily. 08/30/20   Gaylan Gerold, DO  B-D ULTRAFINE III SHORT PEN 31G X 8 MM  MISC USE TO INJECT INSULIN 1 TIME DAILY DIAG CODE E11.9 INSULIN DEPENDENT 03/12/21   Harvie Heck, MD  blood glucose meter kit and supplies KIT Dispense based on patient and insurance preference. Use up to four times daily as directed. (FOR ICD-9 250.00, 250.01). 06/15/19   Jean Rosenthal, MD  carvedilol (COREG) 6.25 MG tablet Take 1 tablet (6.25 mg total) by mouth 2 (two) times  daily. 12/21/19   Lelon Perla, MD  Dulaglutide (TRULICITY) 9.98 PJ/8.2NK SOPN Inject 0.75 mg into the skin once a week. 04/04/21   Aldine Contes, MD  DULoxetine (CYMBALTA) 30 MG capsule duloxetine 30 mg capsule,delayed release  TAKE 1 CAPSULE BY MOUTH EVERY DAY    [provider]  empagliflozin (JARDIANCE) 10 MG TABS tablet Take 1 tablet (10 mg total) by mouth daily. 02/19/21   Axel Filler, MD  Erenumab-aooe (AIMOVIG) 140 MG/ML SOAJ Inject 140 mg into the skin every 30 (thirty) days. 05/09/20   Kathrynn Ducking, MD  fluticasone (FLONASE) 50 MCG/ACT nasal spray PLACE 1 SPRAY INTO BOTH NOSTRILS DAILY. 05/13/21 05/13/22  Sanjuan Dame, MD  folic acid (FOLVITE) 1 MG tablet TAKE 1 TABLET BY MOUTH EVERY DAY 02/04/21   Gaylan Gerold, DO  levothyroxine (SYNTHROID) 137 MCG tablet TAKE 1 TABLET BY MOUTH EVERY DAY BEFORE BREAKFAST 08/23/20   Aldine Contes, MD  losartan (COZAAR) 50 MG tablet TAKE 1 TABLET BY MOUTH EVERY DAY 02/15/21   Sanjuan Dame, MD  metFORMIN (GLUCOPHAGE) 1000 MG tablet TAKE 1 TABLET BY MOUTH TWICE A DAY WITH MEALS 03/19/21   Mosetta Anis, MD  Multiple Vitamin (MULTIVITAMIN WITH MINERALS) TABS tablet Take 1 tablet by mouth every evening.     [provider]  oxyCODONE-acetaminophen (PERCOCET) 10-325 MG tablet Take 1 tablet by mouth every 4 (four) hours as needed for pain.    [provider]  pantoprazole (PROTONIX) 20 MG tablet Take 1 tablet (20 mg total) by mouth daily. 08/29/20   Gaylan Gerold, DO    Allergies    Lisinopril  Review of Systems   Review of Systems   Constitutional:  Negative for activity change, chills, diaphoresis and fever.  HENT:  Negative for congestion and sore throat.   Respiratory:  Negative for shortness of breath and wheezing.   Cardiovascular:  Negative for chest pain and palpitations.  Gastrointestinal:  Positive for abdominal pain and constipation. Negative for diarrhea, nausea and vomiting.  Genitourinary:  Negative for dysuria, frequency, hematuria and urgency.  Musculoskeletal:  Negative for back pain, myalgias, neck pain and neck stiffness.  Skin:  Negative for rash.  Allergic/Immunologic: Negative for immunocompromised state.  Neurological:  Negative for seizures, syncope, weakness, numbness and headaches.  Psychiatric/Behavioral:  Negative for confusion.    Physical Exam Updated Vital Signs BP (!) 143/75 (BP Location: Right Arm)   Pulse 93   Temp 98 F (36.7 C) (Oral)   Resp 15   Ht 5' 8" (1.727 m)   Wt 92.1 kg   LMP 12/03/2011   SpO2 100%   BMI 30.87 kg/m   Physical Exam Vitals and nursing note reviewed.  Constitutional:      General: She is not in acute distress.    Appearance: She is not ill-appearing, toxic-appearing or diaphoretic.  HENT:     Head: Normocephalic.  Eyes:     Conjunctiva/sclera: Conjunctivae normal.  Cardiovascular:     Rate and Rhythm: Normal rate and regular rhythm.     Heart sounds: No murmur heard.   No friction rub. No gallop.  Pulmonary:     Effort: Pulmonary effort is normal. No respiratory distress.     Breath sounds: No stridor. No wheezing, rhonchi or rales.  Chest:     Chest wall: No tenderness.  Abdominal:     General: There is no distension.     Palpations: Abdomen is soft. There is no mass.     Tenderness: There  is abdominal tenderness. There is no right CVA tenderness, left CVA tenderness, guarding or rebound.     Hernia: No hernia is present.     Comments: Tender to palpation in the right lower quadrant without rebound or guarding.  No tenderness over  McBurney's point.  Negative Murphy sign.  No CVA tenderness bilaterally.  Hypoactive bowel sounds in all 4 quadrants.  Musculoskeletal:     Cervical back: Neck supple.  Skin:    General: Skin is warm.     Findings: No rash.  Neurological:     Mental Status: She is alert.  Psychiatric:        Behavior: Behavior normal.    ED Results / Procedures / Treatments   Labs (all labs ordered are listed, but only abnormal results are displayed) Labs Reviewed  URINALYSIS, ROUTINE W REFLEX MICROSCOPIC - Abnormal; Notable for the following components:      Result Value   Glucose, UA >=500 (*)    Ketones, ur 5 (*)    Bacteria, UA RARE (*)    All other components within normal limits  CBC - Abnormal; Notable for the following components:   Platelets 416 (*)    All other components within normal limits  BASIC METABOLIC PANEL - Abnormal; Notable for the following components:   Glucose, Bld 113 (*)    All other components within normal limits  I-STAT BETA HCG BLOOD, ED ([facility], [facility], [facility] ONLY)    EKG None  Radiology CT Renal Stone Study  Result Date: 06/08/2021 CLINICAL DATA:  Flank pain and inguinal pain for several days, initial encounter EXAM: CT ABDOMEN AND PELVIS WITHOUT CONTRAST TECHNIQUE: Multidetector CT imaging of the abdomen and pelvis was performed following the standard protocol without IV contrast. COMPARISON:  05/11/2017 FINDINGS: Lower chest: No acute abnormality. Hepatobiliary: Gallbladder is decompressed. Liver is within normal limits. Pancreas: Unremarkable. No pancreatic ductal dilatation or surrounding inflammatory changes. Spleen: Normal in size without focal abnormality. Adrenals/Urinary Tract: Adrenal glands are within normal limits bilaterally. Small extrarenal pelves are seen bilaterally. No renal or ureteral calculi are noted. Bladder is within normal limits. Stomach/Bowel: Scattered fecal material is noted throughout the colon consistent with a degree of constipation. The  appendix is air-filled and within normal limits. Stomach and small bowel are unremarkable. Vascular/Lymphatic: Aortic atherosclerosis. No enlarged abdominal or pelvic lymph nodes. Reproductive: Status post hysterectomy. No adnexal masses. Other: No abdominal wall hernia or abnormality. No abdominopelvic ascites. Musculoskeletal: No acute or significant osseous findings. IMPRESSION: Mild constipation of the colon. No other focal abnormality is seen. Electronically Signed   By: Inez Catalina M.D.   On: 06/08/2021 03:04    Procedures Procedures   Medications Ordered in ED Medications  simethicone (MYLICON) 40 NW/2.9FA suspension 40 mg (40 mg Oral Given 06/08/21 2130)    ED Course  I have reviewed the triage vital signs and the nursing notes.  Pertinent labs & imaging results that were available during my care of the patient were reviewed by me and considered in my medical decision making (see chart for details).    MDM Rules/Calculators/A&P                          55 year old female with a history of neurosarcoidosis, anterior rectocele, diabetes mellitus type 2, HTN, hypothyroidism who presents emergency department with right-sided flank pain for the last 3 to 4 days.  No other associated symptoms.  She does state that the pain intermittently radiates down  her right leg, but there are no known aggravating or alleviating factors.  Vital signs are reassuring.  Labs and imaging of been reviewed and independently interpreted by me.  Abdomen is benign she does not have a surgical abdomen.  No leukocytosis.  No metabolic derangements.  UA is not concerning for infection.  Pregnancy test is negative.  CT stone study obtained Scattered fecal material is noted throughout the colon.  This appears to be more concentrated over the area where she is centered.  Discussed and reviewed images with the patient at bedside.  Will give simethicone in the ED.  Discussed previous bowel regimens and she has never tried  GoLytely.  Will attempt GoLytely.  She has a follow-up pending with GI at [facility].  Encouraged her to call the office and schedule a follow-up appointment.  At this time, she is hemodynamically stable in no acute distress.  No evidence of fecal impaction, bowel obstruction, appendicitis, obstructive uropathy, mesenteric ischemia, pyelonephritis, pancreatitis, or cholecystitis.  She is hemodynamically stable no acute distress.  Safer discharge home with outpatient follow-up as discussed.  Final Clinical Impression(s) / ED Diagnoses Final diagnoses:  Constipation, unspecified constipation type  RLQ abdominal pain    Rx / DC Orders ED Discharge Orders          Ordered    polyethylene glycol (COLYTE) 240 g solution        06/08/21 0338             Filomena Pokorney A, PA-C 06/08/21 0507    Veryl Speak, MD 06/08/21 2303

## 2021-06-08 NOTE — Discharge Instructions (Addendum)
Thank you for allowing me to care for you today in the Emergency Department.   You can take simethicone, which is available over-the-counter, to help with gas and constipation.  Use as directed on the label.  Drink 8 ounces of GoLytely every 10 minutes until the entire solution is consumed.  You should try to stay home for the next few hours as she will likely have several bowel movements.  You can also take senna, which is available over-the-counter, as directed  Please make sure to call and schedule follow-up appoint with the specialist at [facility] for your symptoms.  Return to the emergency department if you start having fevers, if your abdomen gets hard and swollen, if you stop passing gas, if you develop uncontrollable vomiting, start having fevers with worsening abdominal pain, or other new, concerning symptoms.

## 2021-06-10 ENCOUNTER — Telehealth: Payer: Self-pay

## 2021-06-10 NOTE — Telephone Encounter (Signed)
Transition Care Management Unsuccessful Follow-up Telephone Call  Date of discharge and from where:  06/08/2021- [place] ED  Attempts:  1st Attempt  Reason for unsuccessful TCM follow-up call:  Left voice message

## 2021-06-11 NOTE — Telephone Encounter (Signed)
Transition Care Management Follow-up Telephone Call Date of discharge and from where: 06/08/2021- ED How have you been since you were released from the hospital? Still in severe pain. Any questions or concerns? No  Items Reviewed: Did the pt receive and understand the discharge instructions provided? Yes  Medications obtained and verified? Yes  Other? No  Any new allergies since your discharge? No  Dietary orders reviewed? N/A Do you have support at home? Yes   Home Care and Equipment/Supplies: Were home health services ordered? not applicable If so, what is the name of the agency? N/A  Has the agency set up a time to come to the patient's home? not applicable Were any new equipment or medical supplies ordered?  No What is the name of the medical supply agency? N/A Were you able to get the supplies/equipment? not applicable Do you have any questions related to the use of the equipment or supplies? No  Functional Questionnaire: (I = Independent and D = Dependent) ADLs: I  Bathing/Dressing- I  Meal Prep- I  Eating- I  Maintaining continence- I  Transferring/Ambulation- I  Managing Meds- I  Follow up appointments reviewed:  PCP Hospital f/u appt confirmed? No  patient was advised to follow up with PCP due to still having pain. [facility] f/u appt confirmed? No . Patient stated she will call GI specialist as soon as possible to schedule.  Are transportation arrangements needed? No  If their condition worsens, is the pt aware to call PCP or go to the Emergency Dept.? Yes Was the patient provided with contact information for the PCP's office or ED? Yes Was to pt encouraged to call back with questions or concerns? Yes

## 2021-06-12 ENCOUNTER — Telehealth: Payer: Self-pay

## 2021-06-12 ENCOUNTER — Ambulatory Visit (HOSPITAL_COMMUNITY)
Admission: RE | Admit: 2021-06-12 | Discharge: 2021-06-12 | Disposition: A | Discharge status: Home / Self Care | Payer: Medicaid Other | Source: Ambulatory Visit | Attending: Internal Medicine | Admitting: Internal Medicine

## 2021-06-12 ENCOUNTER — Encounter: Payer: Self-pay | Admitting: Internal Medicine

## 2021-06-12 ENCOUNTER — Ambulatory Visit: Payer: Medicaid Other | Admitting: Internal Medicine

## 2021-06-12 ENCOUNTER — Other Ambulatory Visit: Payer: Self-pay

## 2021-06-12 VITALS — BP 131/71 | HR 86 | Temp 98.3°F | Ht 68.0 in | Wt 204.2 lb

## 2021-06-12 DIAGNOSIS — M25551 Pain in right hip: Secondary | ICD-10-CM | POA: Diagnosis not present

## 2021-06-12 DIAGNOSIS — M8000XS Age-related osteoporosis with current pathological fracture, unspecified site, sequela: Secondary | ICD-10-CM

## 2021-06-12 DIAGNOSIS — S32591A Other specified fracture of right pubis, initial encounter for closed fracture: Secondary | ICD-10-CM | POA: Diagnosis not present

## 2021-06-12 MED ORDER — KETOROLAC TROMETHAMINE 60 MG/2ML IM SOLN: 60.0000 mg | Freq: Once | INTRAMUSCULAR | Status: DC

## 2021-06-12 MED ORDER — KETOROLAC TROMETHAMINE 30 MG/ML IJ SOLN
30.0000 mg | Freq: Once | INTRAMUSCULAR | Status: AC
  Administered 2021-06-12: 60 mg via INTRAMUSCULAR

## 2021-06-12 MED ORDER — CELECOXIB 200 MG PO CAPS: 200.0000 mg | ORAL_CAPSULE | Freq: Two times a day (BID) | ORAL | 0 refills | Status: AC | PRN

## 2021-06-12 NOTE — Telephone Encounter (Signed)
Requesting to speak with a nurse about getting an appt for today, states she is having leg pain and lower abdominal pain. Please call pt back.

## 2021-06-12 NOTE — Progress Notes (Signed)
CC: RLQ abdominal pain; right hip and leg pain  HPI:  [name] [name] is a 55 y.o. with a PMHx as listed below who presents to the clinic for RLQ abdominal pain; right hip and leg pain.   Please see the Encounters tab for problem-based Assessment & Plan regarding status of patient's acute and chronic conditions.  Past Medical History:  Diagnosis Date   Abdominal pain 06/25/2010   CT abd/pelvis 05/2014: Very large amount of stool noted distending the colon. The size consistent with severe constipation/fecal impaction.     Abnormal cervical Papanicolaou smear 04/17/2017   Anemia    Arthritis    lumbar region- radiculopathy   Cancer ([place]) 2013    Thyroid Ca-thyroidectomy   Chronic fatigue    DM (diabetes mellitus) (HCC)    Gait disturbance    GERD (gastroesophageal reflux disease)    Headache(784.0)    Herniated lumbar disc without myelopathy 08/20/2016   History of osteoporosis 05/25/2007   DEXA 2014 Since the baseline examination in 2002, there has been a  statistically significant 19.5% increase in bone mineral density in  the lumbar spine and a statistically significant 14.6% increase in  bone mineral density in the left femur.   DEXA 2002 with osteoporosis of lumbar spine and moderate osteopenia of left hip MRI 2002 left hip negative for avascular necrosis  Qualifier: Diagnosis o   HTN (hypertension)    Hypothyroidism    Insomnia 01/02/2015   Insomnia 01/02/2015   Intermittent left-sided chest pain 01/26/2013   Left sided numbness 12/10/2018   Nephrolithiasis    Obesity    OSA (obstructive sleep apnea)    last test- many yrs. 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  Gastrointestinal:  Positive for abdominal pain and constipation (chronically, well controlled). Negative for blood in stool, diarrhea, melena, nausea and vomiting.  Musculoskeletal:  Positive for back pain, joint pain and myalgias. Negative for falls and neck pain.  Neurological:  Positive for focal weakness (right leg 2/2 pain). Negative for dizziness, sensory change and headaches.   Physical Exam:  Vitals:   06/12/21 1340  BP: 131/71  Pulse: 86  Temp: 98.3 F (36.8 C)  TempSrc: Oral  SpO2: 100%  Weight: 204 lb 3.2 oz (92.6 kg)  Height: 5\' 8"  (1.727 m)   Physical Exam Vitals and nursing note reviewed.  Constitutional:      General: She is awake. She is in acute distress (2/2 pain).     Appearance: Normal appearance. She is normal weight.  HENT:     Head: Normocephalic and atraumatic.  Pulmonary:     Effort: Pulmonary effort is normal. No respiratory distress.  Abdominal:     General: Bowel sounds are normal. There is no distension.     Palpations: Abdomen is soft. There is no fluid wave or mass.     Tenderness: There is abdominal tenderness (Overlying the inguinal line with some radiation to the RLQ). There is no right CVA tenderness or guarding.     Hernia: No hernia is present.  Musculoskeletal:     Comments:  Thoracic spine examination is unremarkable.  On the lateral aspect of her mid back, patient does have some tenderness to palpation however no edema, spasm or deformity noted.  Significant pain noted when attempting to lay in the supine position.  Patient is unable to straighten her leg due to significant pain that is present in the inguinal line and right hip region.    Right hip: With passive motion, there is tenderness with internal rotation, flexion and extension.  No bony tenderness can be palpated over the great trochanter.  Neurological:     Mental Status: She is oriented to person, place, and time.  Psychiatric:        Mood and Affect: Mood  normal.        Behavior: Behavior normal. Behavior is cooperative.    Assessment & Plan:   See Encounters Tab for problem based charting.  Patient seen with Dr. Jimmye Norman

## 2021-06-12 NOTE — Telephone Encounter (Signed)
Return pt's call. Pt states she went to the ED (on 7/8) for stomach and leg pain; she thought she had a kidney stone. Pt states the pain started last week. CT was done - showed mild constipation. States she has been taking metamucil with little result. States the pain is worse, RLQ pain also her legs. Only continuity appt available today w/Dr Charleen Kirks- appt schedule for 1345 PM.

## 2021-06-12 NOTE — Patient Instructions (Addendum)
It was nice seeing you today! Thank you for choosing [facility] for your Primary Care.    Today we talked about:   Pubic Rami Fractures: You have 2 fractures in your pelvis. It will be important to work on pain control over the next few days. The pain will start to improve during the next week.  You can begin putting weight on that leg as tolerated by your pain level in the next few days (Try to slowly increase your activity levels to avoid falls) I have sent in a prescription for an anti-inflammatory called Celecoxib that is safer for patients with heart history. The maximum dose is 1 tablet, twice per day. Use this as needed during the next week.  I am also waiting to hear back from the [facility].  Long term, we will need to work on treating your osteoporosis to avoid future fractures such as these.   Follow up in 4-6 weeks or sooner if symptoms do not improve.

## 2021-06-13 DIAGNOSIS — M810 Age-related osteoporosis without current pathological fracture: Secondary | ICD-10-CM | POA: Insufficient documentation

## 2021-06-13 DIAGNOSIS — S32599A Other specified fracture of unspecified pubis, initial encounter for closed fracture: Secondary | ICD-10-CM | POA: Insufficient documentation

## 2021-06-13 NOTE — Assessment & Plan Note (Signed)
Per chart review, patient has a history of osteopenia and osteoporosis dating back to 2002.  It was suspected that this was secondary to chronic steroid use.  The areas tested at that time were the lumbar spine and left hip.  She had a repeat DEXA scan in 2007 that showed her Z score was within the expected range for age.  During this time she was being treated with alendronate.  In 2014 patient had an additional DEXA scan that showed improvement:  "Since the baseline examination in 2002, there has been a statistically significant 19.5% increase in bone mineral density in  the lumbar spine and a statistically significant 14.6% increase in bone mineral density in the left femur."  Given that now patient has 2 fractures without trauma or falls, she would benefit from an additional DEXA scan to see if there has been any change in her Z score.  We will need to discuss this further at her next visit.  -Consider ordering DEXA scan at next visit

## 2021-06-13 NOTE — Assessment & Plan Note (Signed)
[name] [name] presents today with 1 week history of right lower quadrant abdominal pain and hip pain that radiates around to her back and down her right thigh.  She notes that the pain initially started in the right lower quadrant of the her abdomen, essentially over the inguinal line.  It then started to radiate around to her hip and down her thigh.  The pain in her thigh is worse on the lateral aspect and the medial aspect rather than the anterior and posterior.  She cannot recall any abnormal motions that triggered this event.  Since the pain started though, it has progressively been worsening.  Pain is exacerbated by motion and by sitting on the affected area.  She has not discovered any alleviating factors.  [name] [name] was evaluated in the emergency department as she was concerned this was kidney stones given previous history of such.  CT renal stone study did not show any intra-abdominal abnormality.  She was discharged with recommendation to follow-up with her PCP.  Assessment/plan: On examination, patient has tenderness to palpation overlying the inguinal canal but no evidence of femoral hernia.  The pain is most exacerbated by manipulating the hip joint though which is more concerning for a musculoskeletal process rather than intra-abdominal.  Due to this, a right hip x-ray was ordered and patient was instructed to return to the clinic after it was done.  Prior to going to radiology, a Toradol injection was administered.  On return to the clinic, patient's hip x-ray was remarkable for a superior and inferior right pubic rami fracture.  This would explain patient's pain.  I am concerned that this is evidence of her progressing osteoporosis given that she has had no trauma or falls.  Please see separate tab for more details.  Patient was counseled to rest for 1 or 2 days and then to begin mobilizing with weightbearing as tolerated.  We discussed the natural progression of pain associated with pubic rami  fractures and that her pain should begin to improve over the next 5 to 7 days.    Given the severity though, I suspect she will need both NSAID and opioid therapy for pain management to allow full mobilization.  I contacted her pain clinic, [facility], and discussed this with the nurse practitioner.  The NP stated the patient must be evaluated by their own clinic in order for additional opioid therapy to be prescribed given that she has a pain contract with them.  - Early mobilization with weightbearing as tolerated - Celebrex 200 mg twice daily as needed for the next 7 days - Encourage patient to contact her pain clinic to make an appointment

## 2021-06-14 DIAGNOSIS — M79662 Pain in left lower leg: Secondary | ICD-10-CM | POA: Diagnosis not present

## 2021-06-14 DIAGNOSIS — G894 Chronic pain syndrome: Secondary | ICD-10-CM | POA: Diagnosis not present

## 2021-06-14 DIAGNOSIS — M961 Postlaminectomy syndrome, not elsewhere classified: Secondary | ICD-10-CM | POA: Diagnosis not present

## 2021-06-14 DIAGNOSIS — G8929 Other chronic pain: Secondary | ICD-10-CM | POA: Diagnosis not present

## 2021-06-14 DIAGNOSIS — M5136 Other intervertebral disc degeneration, lumbar region: Secondary | ICD-10-CM | POA: Diagnosis not present

## 2021-06-14 DIAGNOSIS — M25512 Pain in left shoulder: Secondary | ICD-10-CM | POA: Diagnosis not present

## 2021-06-18 ENCOUNTER — Other Ambulatory Visit: Payer: Self-pay | Admitting: Cardiology

## 2021-06-18 ENCOUNTER — Other Ambulatory Visit: Payer: Self-pay | Admitting: Neurology

## 2021-06-18 ENCOUNTER — Other Ambulatory Visit: Payer: Self-pay | Admitting: Student

## 2021-06-18 DIAGNOSIS — E890 Postprocedural hypothyroidism: Secondary | ICD-10-CM

## 2021-06-18 DIAGNOSIS — I420 Dilated cardiomyopathy: Secondary | ICD-10-CM

## 2021-06-18 DIAGNOSIS — I10 Essential (primary) hypertension: Secondary | ICD-10-CM

## 2021-06-18 NOTE — Progress Notes (Signed)
Internal Medicine Clinic Attending  I saw and evaluated the patient.  I personally confirmed the key portions of the history and exam documented by Dr. Charleen Kirks and I reviewed pertinent patient test results.  The assessment, diagnosis, and plan were formulated together and I agree with the documentation in the resident's note.  Findings of pubic rami fracture without associated trauma is quite unexpected; evaluation for osteoporosis will be necessary.  Pain management is a significant concern - Dr. Charleen Kirks reached out to NP at [name] pain management clinic who preferred to evaluate [name] [name] independently before authorizing a change in her chronic opioid regimen.  THis will be difficult given the significant amount of pain patient is understandably experiencing.

## 2021-06-22 ENCOUNTER — Other Ambulatory Visit: Payer: Self-pay | Admitting: Student

## 2021-06-22 DIAGNOSIS — I10 Essential (primary) hypertension: Secondary | ICD-10-CM

## 2021-07-01 DIAGNOSIS — Z419 Encounter for procedure for purposes other than remedying health state, unspecified: Secondary | ICD-10-CM | POA: Diagnosis not present

## 2021-07-16 DIAGNOSIS — M5136 Other intervertebral disc degeneration, lumbar region: Secondary | ICD-10-CM | POA: Diagnosis not present

## 2021-07-16 DIAGNOSIS — G894 Chronic pain syndrome: Secondary | ICD-10-CM | POA: Diagnosis not present

## 2021-07-16 DIAGNOSIS — M79662 Pain in left lower leg: Secondary | ICD-10-CM | POA: Diagnosis not present

## 2021-07-16 DIAGNOSIS — G8929 Other chronic pain: Secondary | ICD-10-CM | POA: Diagnosis not present

## 2021-07-16 DIAGNOSIS — M961 Postlaminectomy syndrome, not elsewhere classified: Secondary | ICD-10-CM | POA: Diagnosis not present

## 2021-07-16 DIAGNOSIS — M25512 Pain in left shoulder: Secondary | ICD-10-CM | POA: Diagnosis not present

## 2021-08-01 ENCOUNTER — Other Ambulatory Visit: Payer: Self-pay | Admitting: Internal Medicine

## 2021-08-01 DIAGNOSIS — E119 Type 2 diabetes mellitus without complications: Secondary | ICD-10-CM

## 2021-08-01 DIAGNOSIS — Z419 Encounter for procedure for purposes other than remedying health state, unspecified: Secondary | ICD-10-CM | POA: Diagnosis not present

## 2021-08-07 ENCOUNTER — Ambulatory Visit: Payer: Medicaid Other | Admitting: Internal Medicine

## 2021-08-07 ENCOUNTER — Encounter: Payer: Self-pay | Admitting: Internal Medicine

## 2021-08-07 ENCOUNTER — Other Ambulatory Visit: Payer: Self-pay

## 2021-08-07 VITALS — BP 116/67 | HR 93 | Temp 98.4°F | Ht 68.0 in | Wt 201.6 lb

## 2021-08-07 DIAGNOSIS — E890 Postprocedural hypothyroidism: Secondary | ICD-10-CM | POA: Diagnosis not present

## 2021-08-07 DIAGNOSIS — R339 Retention of urine, unspecified: Secondary | ICD-10-CM | POA: Diagnosis not present

## 2021-08-07 DIAGNOSIS — E119 Type 2 diabetes mellitus without complications: Secondary | ICD-10-CM

## 2021-08-07 DIAGNOSIS — M8000XS Age-related osteoporosis with current pathological fracture, unspecified site, sequela: Secondary | ICD-10-CM | POA: Diagnosis not present

## 2021-08-07 DIAGNOSIS — Z23 Encounter for immunization: Secondary | ICD-10-CM | POA: Diagnosis not present

## 2021-08-07 DIAGNOSIS — E785 Hyperlipidemia, unspecified: Secondary | ICD-10-CM

## 2021-08-07 DIAGNOSIS — S32591D Other specified fracture of right pubis, subsequent encounter for fracture with routine healing: Secondary | ICD-10-CM | POA: Diagnosis not present

## 2021-08-07 DIAGNOSIS — I10 Essential (primary) hypertension: Secondary | ICD-10-CM | POA: Diagnosis not present

## 2021-08-07 DIAGNOSIS — M810 Age-related osteoporosis without current pathological fracture: Secondary | ICD-10-CM | POA: Diagnosis not present

## 2021-08-07 LAB — POCT GLYCOSYLATED HEMOGLOBIN (HGB A1C): Hemoglobin A1C: 6.1 % — AB (ref 4.0–5.6)

## 2021-08-07 LAB — GLUCOSE, CAPILLARY: Glucose-Capillary: 102 mg/dL — ABNORMAL HIGH (ref 70–99)

## 2021-08-07 MED ORDER — LEVOTHYROXINE SODIUM 137 MCG PO TABS: ORAL_TABLET | ORAL | 1 refills | Status: DC

## 2021-08-07 MED ORDER — ATORVASTATIN CALCIUM 20 MG PO TABS: 20.0000 mg | ORAL_TABLET | Freq: Every day | ORAL | 3 refills | Status: DC

## 2021-08-07 MED ORDER — EMPAGLIFLOZIN 10 MG PO TABS: 10.0000 mg | ORAL_TABLET | Freq: Every day | ORAL | 3 refills | Status: DC

## 2021-08-07 MED ORDER — TRULICITY 0.75 MG/0.5ML ~~LOC~~ SOAJ: 0.7500 mg | SUBCUTANEOUS | 3 refills | Status: DC

## 2021-08-07 MED ORDER — METFORMIN HCL 1000 MG PO TABS: 1000.0000 mg | ORAL_TABLET | Freq: Two times a day (BID) | ORAL | 1 refills | Status: DC

## 2021-08-07 NOTE — Patient Instructions (Addendum)
I have ordered an MRI of your lower spine for evaluation of the urinary symptoms.  I have ordered a DEXA scan for re-evaluation of osteoporosis.  If your urinary retention acutely worsens or you develop an acute change in symptoms while awaiting MRI, please seek immediate evaluation in the ED.   Please schedule an office visit with Dr. Ileene Musa (PCP) in one month.

## 2021-08-07 NOTE — Assessment & Plan Note (Addendum)
HPI: She presents for a follow up visit today. She continues to experience pain which is being managed through the pain medicine clinic.  She also is noting right sided sciatica, issues with urinary retention, and questionable saddle anesthesia (occassional pins and needles). She explains that she does not have the sensation to urinate. She is able to void, although more difficult. Denies bladder or bowel incontinence. She notes that these symptoms all began at the same time as the pubic ramus fracture. Denies lower extremity numbness or weakness.   Exam: sensation and motor strength intact and equal in the bilateral lower extremities. + straight leg test on the right.   Assessment: primary concern is for cauda equina syndrome or some other neuropathic process including relation to neurosarcoidosis.  No recent history of neurosurgical procedures on the lumbar spine or known disk disease.  It was rather difficult to elicit whether or not she has true saddle anesthesia. The sciatica is only present on the right side. Discussed potential for cauda equina and inherent risks and she is agreeable to an outpatient workup Can not r/o alternative etiology such as peripheral manifestation of neurosarcoidosis vs other autoimmune condition  Plan -Stat MRI L-spine. Further management pending those results.  -Discussed indications for emergent evaluation at length -If imaging is unremarkable, will have her follow up with her neurologist for further evaluation -She will follow up with Dr. Ileene Musa (PCP) next month

## 2021-08-07 NOTE — Assessment & Plan Note (Signed)
Current medications: losartan '50mg'$  daily, coreg 6.'25mg'$  BID Blood pressure is at goal in the office today. BMP from 06/07/21 reviewed and normal. BP Readings from Last 3 Encounters:  08/07/21 116/67  06/12/21 131/71  06/08/21 (!) 143/75   Plan Continue current management Follow up with PCP for ongoing monitoring

## 2021-08-07 NOTE — Progress Notes (Signed)
Office Visit   Patient ID: [name] [name], female    DOB: [birth year], 55 y.o.   MRN: [id number]   PCP: Scarlett Presto, MD   Subjective:  [name] [name] is a 55 y.o. year old female who presents for follow up of pubic rami fracture. Please refer to problem based charting for assessment and plan.  Objective:   BP 116/67 (BP Location: Left Arm, Patient Position: Sitting, Cuff Size: Normal)   Pulse 93   Temp 98.4 F (36.9 C) (Oral)   Ht '5\' 8"'$  (1.727 m)   Wt 201 lb 9.6 oz (91.4 kg)   LMP 12/03/2011   SpO2 100%   BMI 30.65 kg/m  BP Readings from Last 3 Encounters:  08/07/21 116/67  06/12/21 131/71  06/08/21 (!) 143/75   General: well appearing female in no acute distress Focused Neurologic exam Sensation equal and intact to the bilateral lower extremities at the level of the hip, knee and ankles Motor strength 5/5 and equal in the bilateral lower extremities at the level of the hip, knee and ankles Gait: analgesic but steady gait + straight leg test on the right  Assessment & Plan:   Problem List Items Addressed This Visit       Cardiovascular and Mediastinum   Essential hypertension (Chronic)    Current medications: losartan '50mg'$  daily, coreg 6.'25mg'$  BID Blood pressure is at goal in the office today. BMP from 06/07/21 reviewed and normal. BP Readings from Last 3 Encounters:  08/07/21 116/67  06/12/21 131/71  06/08/21 (!) 143/75   Plan Continue current management Follow up with PCP for ongoing monitoring      Relevant Medications   atorvastatin (LIPITOR) 20 MG tablet     Endocrine   Controlled diabetes mellitus type 2, (HCC) (Chronic)    Current medications: metformin '1000mg'$  BID, jardiance '10mg'$  daily, trulicity 0.'75mg'$  weekly A1C 6.1 suggests adequate glycemic control at this time. Continue current management. She will continue to follow with Dr. Ileene Musa (PCP) for ongoing management.       Relevant Medications   atorvastatin (LIPITOR) 20 MG tablet    Dulaglutide (TRULICITY) [id number] [id number] SOPN   empagliflozin (JARDIANCE) 10 MG TABS tablet   metFORMIN (GLUCOPHAGE) 1000 MG tablet   Other Relevant Orders   POC Hbg A1C (Completed)   Hypothyroidism (Chronic)   Relevant Medications   levothyroxine (SYNTHROID) 137 MCG tablet     Musculoskeletal and Integument   Closed fracture of multiple pubic rami (HCC) - Primary    HPI: She presents for a follow up visit today. She continues to experience pain which is being managed through the pain medicine clinic.  She also is noting right sided sciatica, issues with urinary retention, and questionable saddle anesthesia (occassional pins and needles). She explains that she does not have the sensation to urinate. She is able to void, although more difficult. Denies bladder or bowel incontinence. She notes that these symptoms all began at the same time as the pubic ramus fracture. Denies lower extremity numbness or weakness.   Exam: sensation and motor strength intact and equal in the bilateral lower extremities. + straight leg test on the right.   Assessment: primary concern is for cauda equina syndrome or some other neuropathic process including relation to neurosarcoidosis.  No recent history of neurosurgical procedures on the lumbar spine or known disk disease.  It was rather difficult to elicit whether or not she has true saddle anesthesia. The sciatica is only present on the right side. Discussed potential  for cauda equina and inherent risks and she is agreeable to an outpatient workup Can not r/o alternative etiology such as peripheral manifestation of neurosarcoidosis vs other autoimmune condition  Plan -Stat MRI L-spine. Further management pending those results.  -Discussed indications for emergent evaluation at length -If imaging is unremarkable, will have her follow up with her neurologist for further evaluation -She will follow up with Dr. Ileene Musa (PCP) next month      Relevant Orders   MR  Lumbar Spine W Wo Contrast   Osteoporosis    Vitamin D and DEXA scan ordered at today's visit.  Follow up with PCP next month for ongoing monitoring.      Relevant Orders   Vitamin D (25 hydroxy)   DG Bone Density     Other   Hyperlipidemia   Relevant Medications   atorvastatin (LIPITOR) 20 MG tablet   levothyroxine (SYNTHROID) 137 MCG tablet   Other Visit Diagnoses     Urinary retention       Relevant Orders   MR Lumbar Spine W Wo Contrast   Need for immunization against influenza       Relevant Orders   Flu Vaccine QUAD 104moIM (Fluarix, Fluzone & Alfiuria Quad PF) (Completed)       Return in about 4 weeks (around 09/04/2021) for for follow up with Dr. DIleene Musa   Pt discussed with Dr. WMarty Heck MD Internal Medicine Resident PGY-3 [facility] Pager: #[phone]/05/2021 6:59 PM

## 2021-08-07 NOTE — Assessment & Plan Note (Signed)
Current medications: metformin '1000mg'$  BID, jardiance '10mg'$  daily, trulicity 0.'75mg'$  weekly A1C 6.1 suggests adequate glycemic control at this time. Continue current management. She will continue to follow with Dr. Ileene Musa (PCP) for ongoing management.

## 2021-08-07 NOTE — Assessment & Plan Note (Signed)
Vitamin D and DEXA scan ordered at today's visit.  Follow up with PCP next month for ongoing monitoring.

## 2021-08-08 LAB — VITAMIN D 25 HYDROXY (VIT D DEFICIENCY, FRACTURES): Vit D, 25-Hydroxy: 56 ng/mL (ref 30.0–100.0)

## 2021-08-09 DIAGNOSIS — M79662 Pain in left lower leg: Secondary | ICD-10-CM | POA: Diagnosis not present

## 2021-08-09 DIAGNOSIS — G894 Chronic pain syndrome: Secondary | ICD-10-CM | POA: Diagnosis not present

## 2021-08-09 DIAGNOSIS — M961 Postlaminectomy syndrome, not elsewhere classified: Secondary | ICD-10-CM | POA: Diagnosis not present

## 2021-08-09 DIAGNOSIS — M5136 Other intervertebral disc degeneration, lumbar region: Secondary | ICD-10-CM | POA: Diagnosis not present

## 2021-08-09 DIAGNOSIS — M25512 Pain in left shoulder: Secondary | ICD-10-CM | POA: Diagnosis not present

## 2021-08-09 DIAGNOSIS — G8929 Other chronic pain: Secondary | ICD-10-CM | POA: Diagnosis not present

## 2021-08-15 ENCOUNTER — Ambulatory Visit
Admission: RE | Admit: 2021-08-15 | Discharge: 2021-08-15 | Disposition: A | Discharge status: Home / Self Care | Payer: Medicaid Other | Source: Ambulatory Visit | Attending: Internal Medicine | Admitting: Internal Medicine

## 2021-08-15 DIAGNOSIS — M48061 Spinal stenosis, lumbar region without neurogenic claudication: Secondary | ICD-10-CM | POA: Diagnosis not present

## 2021-08-15 DIAGNOSIS — D869 Sarcoidosis, unspecified: Secondary | ICD-10-CM | POA: Diagnosis not present

## 2021-08-15 DIAGNOSIS — G834 Cauda equina syndrome: Secondary | ICD-10-CM | POA: Diagnosis not present

## 2021-08-15 DIAGNOSIS — S32591D Other specified fracture of right pubis, subsequent encounter for fracture with routine healing: Secondary | ICD-10-CM

## 2021-08-15 DIAGNOSIS — M5126 Other intervertebral disc displacement, lumbar region: Secondary | ICD-10-CM | POA: Diagnosis not present

## 2021-08-15 DIAGNOSIS — R339 Retention of urine, unspecified: Secondary | ICD-10-CM

## 2021-08-15 MED ORDER — GADOBENATE DIMEGLUMINE 529 MG/ML IV SOLN
20.0000 mL | Freq: Once | INTRAVENOUS | Status: AC | PRN
  Administered 2021-08-15: 20 mL via INTRAVENOUS

## 2021-08-19 NOTE — Progress Notes (Signed)
Internal Medicine Clinic Attending  Case discussed with Dr. Christian  At the time of the visit.  We reviewed the resident's history and exam and pertinent patient test results.  I agree with the assessment, diagnosis, and plan of care documented in the resident's note.  

## 2021-08-31 DIAGNOSIS — Z419 Encounter for procedure for purposes other than remedying health state, unspecified: Secondary | ICD-10-CM | POA: Diagnosis not present

## 2021-09-06 DIAGNOSIS — G894 Chronic pain syndrome: Secondary | ICD-10-CM | POA: Diagnosis not present

## 2021-09-06 DIAGNOSIS — G8929 Other chronic pain: Secondary | ICD-10-CM | POA: Diagnosis not present

## 2021-09-06 DIAGNOSIS — M79662 Pain in left lower leg: Secondary | ICD-10-CM | POA: Diagnosis not present

## 2021-09-06 DIAGNOSIS — M25512 Pain in left shoulder: Secondary | ICD-10-CM | POA: Diagnosis not present

## 2021-09-06 DIAGNOSIS — M961 Postlaminectomy syndrome, not elsewhere classified: Secondary | ICD-10-CM | POA: Diagnosis not present

## 2021-09-06 DIAGNOSIS — M5136 Other intervertebral disc degeneration, lumbar region: Secondary | ICD-10-CM | POA: Diagnosis not present

## 2021-09-19 ENCOUNTER — Other Ambulatory Visit: Payer: Self-pay | Admitting: Cardiology

## 2021-09-19 ENCOUNTER — Other Ambulatory Visit: Payer: Self-pay | Admitting: Internal Medicine

## 2021-09-19 DIAGNOSIS — I420 Dilated cardiomyopathy: Secondary | ICD-10-CM

## 2021-09-19 DIAGNOSIS — E119 Type 2 diabetes mellitus without complications: Secondary | ICD-10-CM

## 2021-09-20 ENCOUNTER — Ambulatory Visit: Payer: Medicaid Other | Admitting: Obstetrics and Gynecology

## 2021-09-28 ENCOUNTER — Other Ambulatory Visit: Payer: Self-pay | Admitting: Cardiology

## 2021-09-28 ENCOUNTER — Other Ambulatory Visit: Payer: Self-pay | Admitting: Neurology

## 2021-09-28 DIAGNOSIS — I420 Dilated cardiomyopathy: Secondary | ICD-10-CM

## 2021-10-01 DIAGNOSIS — Z419 Encounter for procedure for purposes other than remedying health state, unspecified: Secondary | ICD-10-CM | POA: Diagnosis not present

## 2021-10-04 DIAGNOSIS — G894 Chronic pain syndrome: Secondary | ICD-10-CM | POA: Diagnosis not present

## 2021-10-04 DIAGNOSIS — M79662 Pain in left lower leg: Secondary | ICD-10-CM | POA: Diagnosis not present

## 2021-10-04 DIAGNOSIS — M5136 Other intervertebral disc degeneration, lumbar region: Secondary | ICD-10-CM | POA: Diagnosis not present

## 2021-10-04 DIAGNOSIS — M961 Postlaminectomy syndrome, not elsewhere classified: Secondary | ICD-10-CM | POA: Diagnosis not present

## 2021-10-04 DIAGNOSIS — M25512 Pain in left shoulder: Secondary | ICD-10-CM | POA: Diagnosis not present

## 2021-10-04 DIAGNOSIS — G8929 Other chronic pain: Secondary | ICD-10-CM | POA: Diagnosis not present

## 2021-10-17 ENCOUNTER — Other Ambulatory Visit: Payer: Self-pay | Admitting: *Deleted

## 2021-10-17 MED ORDER — METHOTREXATE 2.5 MG PO TABS: 2.5000 mg | ORAL_TABLET | ORAL | 0 refills | Status: DC

## 2021-10-17 NOTE — Telephone Encounter (Signed)
Patient called in requesting refill on methotrexate. States she normally has this refilled by neuro but her doctor Margette Fast) has retired. Neuro told her to ask for refill from PCP until she establishes with new Provider on 11/28/21.

## 2021-10-25 ENCOUNTER — Other Ambulatory Visit: Payer: Self-pay | Admitting: Cardiology

## 2021-10-25 DIAGNOSIS — I420 Dilated cardiomyopathy: Secondary | ICD-10-CM

## 2021-10-31 DIAGNOSIS — Z419 Encounter for procedure for purposes other than remedying health state, unspecified: Secondary | ICD-10-CM | POA: Diagnosis not present

## 2021-11-01 DIAGNOSIS — M79662 Pain in left lower leg: Secondary | ICD-10-CM | POA: Diagnosis not present

## 2021-11-01 DIAGNOSIS — G8929 Other chronic pain: Secondary | ICD-10-CM | POA: Diagnosis not present

## 2021-11-01 DIAGNOSIS — M25512 Pain in left shoulder: Secondary | ICD-10-CM | POA: Diagnosis not present

## 2021-11-01 DIAGNOSIS — M5136 Other intervertebral disc degeneration, lumbar region: Secondary | ICD-10-CM | POA: Diagnosis not present

## 2021-11-01 DIAGNOSIS — M961 Postlaminectomy syndrome, not elsewhere classified: Secondary | ICD-10-CM | POA: Diagnosis not present

## 2021-11-01 DIAGNOSIS — G894 Chronic pain syndrome: Secondary | ICD-10-CM | POA: Diagnosis not present

## 2021-11-02 ENCOUNTER — Other Ambulatory Visit: Payer: Self-pay | Admitting: Student

## 2021-11-02 ENCOUNTER — Other Ambulatory Visit: Payer: Self-pay | Admitting: Cardiology

## 2021-11-02 ENCOUNTER — Other Ambulatory Visit: Payer: Self-pay | Admitting: Internal Medicine

## 2021-11-02 DIAGNOSIS — I420 Dilated cardiomyopathy: Secondary | ICD-10-CM

## 2021-11-02 DIAGNOSIS — I10 Essential (primary) hypertension: Secondary | ICD-10-CM

## 2021-11-21 ENCOUNTER — Ambulatory Visit: Payer: Medicaid Other | Admitting: Neurology

## 2021-11-22 ENCOUNTER — Telehealth: Payer: Self-pay | Admitting: Student

## 2021-11-22 NOTE — Telephone Encounter (Signed)
Paged on-call provider concerning fever, body aches, sore throat for the past 3 days.  States sister tested positive for COVID 4 days ago.  Patient notes that she was recently on vacation with her sister and spent a good amount of time is concerned she has COVID.  She has not had a COVID test recently.  She reports she is vaccinated x2 and received 1 booster vaccination.  States she has no shortness of breath.  Has mild cough that is not productive.  Denies any nausea, vomiting, diarrhea.  She does feel she has some decreased appetite but is able to keep down food and fluids.  She is concerned about whether or not she can take Tylenol with her other medications.  Discussed that she can take Tylenol and other as needed medication such as Mucinex to help with her symptoms. Given her exposure to her sister recently tested positive for COVID very likely she has COVID as well.  She could also have other viral URI versus influenza. Advised her to continue supportive measures for now.  Discussed that she would unlikely to benefit from Paxil but mother covered medication this time given her vaccinated status in addition to her other medications with a very likely have interactions.  Her stance is agreeable with plan.  Discussed if her symptoms are worse and she develops severe shortness of breath, fever not improving with Tylenol, or is unable to tolerate p.o. intake she should seek further help with the ED.

## 2021-11-28 ENCOUNTER — Ambulatory Visit: Payer: Medicaid Other | Admitting: Neurology

## 2021-12-01 DIAGNOSIS — Z419 Encounter for procedure for purposes other than remedying health state, unspecified: Secondary | ICD-10-CM | POA: Diagnosis not present

## 2021-12-01 HISTORY — PX: TOTAL ABDOMINAL HYSTERECTOMY: SHX209

## 2021-12-05 DIAGNOSIS — M5136 Other intervertebral disc degeneration, lumbar region: Secondary | ICD-10-CM | POA: Diagnosis not present

## 2021-12-05 DIAGNOSIS — M961 Postlaminectomy syndrome, not elsewhere classified: Secondary | ICD-10-CM | POA: Diagnosis not present

## 2021-12-05 DIAGNOSIS — M25512 Pain in left shoulder: Secondary | ICD-10-CM | POA: Diagnosis not present

## 2021-12-05 DIAGNOSIS — G894 Chronic pain syndrome: Secondary | ICD-10-CM | POA: Diagnosis not present

## 2021-12-05 DIAGNOSIS — M79662 Pain in left lower leg: Secondary | ICD-10-CM | POA: Diagnosis not present

## 2021-12-05 DIAGNOSIS — G8929 Other chronic pain: Secondary | ICD-10-CM | POA: Diagnosis not present

## 2021-12-08 ENCOUNTER — Other Ambulatory Visit: Payer: Self-pay | Admitting: Internal Medicine

## 2021-12-19 ENCOUNTER — Other Ambulatory Visit: Payer: Self-pay | Admitting: Cardiology

## 2021-12-19 DIAGNOSIS — I420 Dilated cardiomyopathy: Secondary | ICD-10-CM

## 2021-12-23 ENCOUNTER — Other Ambulatory Visit: Payer: Self-pay

## 2021-12-23 ENCOUNTER — Encounter: Payer: Self-pay | Admitting: Internal Medicine

## 2021-12-23 ENCOUNTER — Ambulatory Visit: Payer: Medicaid Other | Admitting: Internal Medicine

## 2021-12-23 VITALS — BP 132/81 | HR 93 | Temp 98.5°F | Resp 28 | Ht 68.0 in | Wt 198.4 lb

## 2021-12-23 DIAGNOSIS — E119 Type 2 diabetes mellitus without complications: Secondary | ICD-10-CM | POA: Diagnosis not present

## 2021-12-23 DIAGNOSIS — I10 Essential (primary) hypertension: Secondary | ICD-10-CM

## 2021-12-23 DIAGNOSIS — D8689 Sarcoidosis of other sites: Secondary | ICD-10-CM | POA: Diagnosis not present

## 2021-12-23 DIAGNOSIS — D869 Sarcoidosis, unspecified: Secondary | ICD-10-CM

## 2021-12-23 DIAGNOSIS — R040 Epistaxis: Secondary | ICD-10-CM

## 2021-12-23 MED ORDER — METFORMIN HCL 1000 MG PO TABS: 1000.0000 mg | ORAL_TABLET | Freq: Two times a day (BID) | ORAL | 1 refills | Status: DC

## 2021-12-23 NOTE — Progress Notes (Addendum)
° °  CC: nose bleed  HPI:[name] is a 56 y.o. female who presents for evaluation of nose bleed. Please see individual problem based A/P for details.   Depression, PHQ-9: Based on the patients  [place] Visit from 08/07/2021 in [facility]  PHQ-9 Total Score 5      score we have 5.  Past Medical History:  Diagnosis Date   Abdominal pain 06/25/2010   CT abd/pelvis 05/2014: Very large amount of stool noted distending the colon. The size consistent with severe constipation/fecal impaction.     Abnormal cervical Papanicolaou smear 04/17/2017   Anemia    Arthritis    lumbar region- radiculopathy   Cancer ([place]) 2013    Thyroid Ca-thyroidectomy   Chronic fatigue    DM (diabetes mellitus) (HCC)    Gait disturbance    GERD (gastroesophageal reflux disease)    Headache(784.0)    Herniated lumbar disc without myelopathy 08/20/2016   History of osteoporosis 05/25/2007   DEXA 2014 Since the baseline examination in 2002, there has been a  statistically significant 19.5% increase in bone mineral density in  the lumbar spine and a statistically significant 14.6% increase in  bone mineral density in the left femur.   DEXA 2002 with osteoporosis of lumbar spine and moderate osteopenia of left hip MRI 2002 left hip negative for avascular necrosis  Qualifier: Diagnosis o   HTN (hypertension)    Hypothyroidism    Insomnia 01/02/2015   Insomnia 01/02/2015   Intermittent left-sided chest pain 01/26/2013   Left sided numbness 12/10/2018   Nephrolithiasis    Obesity    OSA (obstructive sleep apnea)    last test- many yrs. 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   HENT: Negative.     Eyes: Negative.   Respiratory: Negative.    Cardiovascular: Negative.   Gastrointestinal: Negative.   Genitourinary: Negative.   Musculoskeletal: Negative.   Skin: Negative.   Neurological: Negative.   Endo/Heme/Allergies: Negative.   Psychiatric/Behavioral: Negative.      Physical Exam: Vitals:   12/23/21 1425 12/23/21 1432  BP: (!) 154/77 132/81  Pulse: 92 93  Resp: (!) 28   Temp: 98.5 F (36.9 C)   TempSrc: Oral   SpO2: 100%   Weight: 198 lb 6.4 oz (90 kg)   Height: 5\' 8"  (1.727 m)      General: alert and oriented, no acute distress HEENT: Conjunctiva nl , antiicteric sclerae, Nasal mucous membranes pink, moist, no exudate or erythema, and no blood seen. Cardiovascular: Normal rate, regular rhythm.  No murmurs, rubs, or gallops Pulmonary : Equal breath sounds, No wheezes, rales, or rhonchi Abdominal: soft, nontender,  bowel sounds present Ext: No edema in lower extremities, no tenderness to palpation of lower extremities.   Assessment & Plan:   See Encounters Tab for problem based charting.  Patient discussed with Dr. Philipp Ovens

## 2021-12-23 NOTE — Patient Instructions (Signed)
Dear Mrs. [name] [name],  Today we evaluated you for a nose bleed. Please hold off on using the Flonase for a handful of days to allow your nose to heal. If you have another severe nose bleed, please contact our office.   I have also placed a referral to neurology so that you can be seen for your sarcoidosis again.

## 2021-12-30 ENCOUNTER — Encounter: Payer: Self-pay | Admitting: Internal Medicine

## 2021-12-30 DIAGNOSIS — R040 Epistaxis: Secondary | ICD-10-CM | POA: Insufficient documentation

## 2021-12-30 NOTE — Assessment & Plan Note (Signed)
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 She also states she has yearly brain scans, but has not been able to follow up with neurology for some time after her privious neurologist left. She also subsequently missed her appointment to establish with new neurologist and has not been able to get appointment since. Requesting referral.  She appears stable at this time. Mentation and thought appropriate. Referral to neurology placed.

## 2021-12-30 NOTE — Assessment & Plan Note (Signed)
Reports compliance. Asymptomatic.  Elevated initially, wnl on recheck. Continue current management.

## 2021-12-30 NOTE — Assessment & Plan Note (Signed)
Patient reports 2 days of bright red blood with clots. Denies any trauma or drug use. She does use Flonase daily and has been using this medication for approximately the last year. She also takes aspirin per her cardiologist. She states she has never had a nose bleed prior to this episode. Denies bleeding disorders in family. No other symptoms, no HA or dizziness.  Patient stable at this time. Advised she hold flonase for several days to allow nasal mucosa to heal. Also advised that if her symptoms return, she should call office to schedule an appointment.

## 2022-01-01 DIAGNOSIS — Z419 Encounter for procedure for purposes other than remedying health state, unspecified: Secondary | ICD-10-CM | POA: Diagnosis not present

## 2022-01-02 ENCOUNTER — Other Ambulatory Visit: Payer: Self-pay | Admitting: Cardiology

## 2022-01-02 DIAGNOSIS — M25512 Pain in left shoulder: Secondary | ICD-10-CM | POA: Diagnosis not present

## 2022-01-02 DIAGNOSIS — M79662 Pain in left lower leg: Secondary | ICD-10-CM | POA: Diagnosis not present

## 2022-01-02 DIAGNOSIS — I420 Dilated cardiomyopathy: Secondary | ICD-10-CM

## 2022-01-02 DIAGNOSIS — G894 Chronic pain syndrome: Secondary | ICD-10-CM | POA: Diagnosis not present

## 2022-01-02 DIAGNOSIS — M5136 Other intervertebral disc degeneration, lumbar region: Secondary | ICD-10-CM | POA: Diagnosis not present

## 2022-01-02 DIAGNOSIS — G8929 Other chronic pain: Secondary | ICD-10-CM | POA: Diagnosis not present

## 2022-01-02 NOTE — Progress Notes (Signed)
Internal Medicine Clinic Attending  Case discussed with Dr. Elliot Gurney  At the time of the visit.  We reviewed the residents history and exam and pertinent patient test results.  I agree with the assessment, diagnosis, and plan of care documented in the residents note.   Patient with isolated episode of epistaxis in the setting of aspirin and flonase use. No history of prior nosebleeds or other mucocutaneous bleeding. Patient instructed to hold flonase and monitor. Call back if bleeding returns.

## 2022-01-24 ENCOUNTER — Inpatient Hospital Stay: Admission: RE | Admit: 2022-01-24 | Payer: Medicaid Other | Source: Ambulatory Visit

## 2022-01-27 ENCOUNTER — Encounter: Payer: Self-pay | Admitting: Neurology

## 2022-01-27 ENCOUNTER — Ambulatory Visit: Payer: Medicaid Other | Admitting: Neurology

## 2022-01-27 ENCOUNTER — Other Ambulatory Visit: Payer: Self-pay | Admitting: Neurology

## 2022-01-27 VITALS — BP 132/80 | HR 100 | Ht 68.0 in | Wt 195.0 lb

## 2022-01-27 DIAGNOSIS — R040 Epistaxis: Secondary | ICD-10-CM | POA: Diagnosis not present

## 2022-01-27 DIAGNOSIS — R519 Headache, unspecified: Secondary | ICD-10-CM

## 2022-01-27 DIAGNOSIS — D8689 Sarcoidosis of other sites: Secondary | ICD-10-CM | POA: Diagnosis not present

## 2022-01-27 DIAGNOSIS — J329 Chronic sinusitis, unspecified: Secondary | ICD-10-CM | POA: Diagnosis not present

## 2022-01-27 DIAGNOSIS — Z79899 Other long term (current) drug therapy: Secondary | ICD-10-CM

## 2022-01-27 DIAGNOSIS — G43709 Chronic migraine without aura, not intractable, without status migrainosus: Secondary | ICD-10-CM | POA: Insufficient documentation

## 2022-01-27 DIAGNOSIS — R2689 Other abnormalities of gait and mobility: Secondary | ICD-10-CM | POA: Diagnosis not present

## 2022-01-27 DIAGNOSIS — R51 Headache with orthostatic component, not elsewhere classified: Secondary | ICD-10-CM | POA: Diagnosis not present

## 2022-01-27 DIAGNOSIS — H539 Unspecified visual disturbance: Secondary | ICD-10-CM | POA: Diagnosis not present

## 2022-01-27 MED ORDER — UBRELVY 100 MG PO TABS: 100.0000 mg | ORAL_TABLET | ORAL | 11 refills | Status: DC | PRN

## 2022-01-27 MED ORDER — AJOVY 225 MG/1.5ML ~~LOC~~ SOAJ
225.0000 mg | SUBCUTANEOUS | 11 refills | Status: DC
Start: 2022-01-27 — End: 2022-06-25

## 2022-01-27 NOTE — Patient Instructions (Addendum)
Neurosarcoidosis: Repeat MRI brain Blood work Referral to ENT Migraine prevention: Ajovy Acute/emergent: [name]:   Ubrogepant tablets What is this medication? UBROGEPANT (ue BROE je pant) is used to treat migraine headaches with or without aura. An aura is a strange feeling or visual disturbance that warns you of an attack. It is not used to prevent migraines. This medicine may be used for other purposes; ask your health care provider or pharmacist if you have questions. COMMON BRAND NAME(S): [name] What should I tell my care team before I take this medication? They need to know if you have any of these conditions: kidney disease liver disease an unusual or allergic reaction to ubrogepant, other medicines, foods, dyes, or preservatives pregnant or trying to get pregnant breast-feeding How should I use this medication? Take this medicine by mouth with a glass of water. Follow the directions on the prescription label. You can take it with or without food. If it upsets your stomach, take it with food. Take your medicine at regular intervals. Do not take it more often than directed. Do not stop taking except on your doctor's advice. Talk to your pediatrician about the use of this medicine in children. Special care may be needed. Overdosage: If you think you have taken too much of this medicine contact a poison control center or emergency room at once. NOTE: This medicine is only for you. Do not share this medicine with others. What if I miss a dose? This does not apply. This medicine is not for regular use. What may interact with this medication? Do not take this medicine with any of the following medicines: ceritinib certain antibiotics like chloramphenicol, clarithromycin, telithromycin certain antivirals for HIV like atazanavir, cobicistat, darunavir, delavirdine, fosamprenavir, indinavir, ritonavir certain medicines for fungal infections like itraconazole, ketoconazole, posaconazole,  voriconazole conivaptan grapefruit idelalisib mifepristone nefazodone ribociclib This medicine may also interact with the following medications: carvedilol certain medicines for seizures like phenobarbital, phenytoin ciprofloxacin cyclosporine eltrombopag fluconazole fluvoxamine quinidine rifampin [facility]. John's wort verapamil This list may not describe all possible interactions. Give your health care provider a list of all the medicines, herbs, non-prescription drugs, or dietary supplements you use. Also tell them if you smoke, drink alcohol, or use illegal drugs. Some items may interact with your medicine. What should I watch for while using this medication? Visit your health care professional for regular checks on your progress. Tell your health care professional if your symptoms do not start to get better or if they get worse. Your mouth may get dry. Chewing sugarless gum or sucking hard candy and drinking plenty of water may help. Contact your health care professional if the problem does not go away or is severe. What side effects may I notice from receiving this medication? Side effects that you should report to your doctor or health care professional as soon as possible: allergic reactions like skin rash, itching or hives; swelling of the face, lips, or tongue Side effects that usually do not require medical attention (report these to your doctor or health care professional if they continue or are bothersome): drowsiness dry mouth nausea tiredness This list may not describe all possible side effects. Call your doctor for medical advice about side effects. You may report side effects to FDA at 1-800-FDA-1088. Where should I keep my medication? Keep out of the reach of children. Store at room temperature between 15 and 30 degrees C (59 and 86 degrees F). Throw away any unused medicine after the expiration date. NOTE: This sheet  is a summary. It may not cover all possible  information. If you have questions about this medicine, talk to your doctor, pharmacist, or health care provider.  2022 Elsevier/Gold Standard (2019-02-03 00:00:00) Rolanda Lundborg injection What is this medication? FREMANEZUMAB (fre ma NEZ ue mab) is used to prevent migraine headaches. This medicine may be used for other purposes; ask your health care provider or pharmacist if you have questions. COMMON BRAND NAME(S): AJOVY What should I tell my care team before I take this medication? They need to know if you have any of these conditions: an unusual or allergic reaction to fremanezumab, other medicines, foods, dyes, or preservatives pregnant or trying to get pregnant breast-feeding How should I use this medication? This medicine is for injection under the skin. You will be taught how to prepare and give this medicine. Use exactly as directed. Take your medicine at regular intervals. Do not take your medicine more often than directed. It is important that you put your used needles and syringes in a special sharps container. Do not put them in a trash can. If you do not have a sharps container, call your pharmacist or healthcare provider to get one. Talk to your pediatrician regarding the use of this medicine in children. Special care may be needed. Overdosage: If you think you have taken too much of this medicine contact a poison control center or emergency room at once. NOTE: This medicine is only for you. Do not share this medicine with others. What if I miss a dose? If you miss a dose, take it as soon as you can. If it is almost time for your next dose, take only that dose. Do not take double or extra doses. What may interact with this medication? Interactions are not expected. This list may not describe all possible interactions. Give your health care provider a list of all the medicines, herbs, non-prescription drugs, or dietary supplements you use. Also tell them if you smoke, drink  alcohol, or use illegal drugs. Some items may interact with your medicine. What should I watch for while using this medication? Tell your doctor or healthcare professional if your symptoms do not start to get better or if they get worse. What side effects may I notice from receiving this medication? Side effects that you should report to your doctor or health care professional as soon as possible: allergic reactions like skin rash, itching or hives, swelling of the face, lips, or tongue Side effects that usually do not require medical attention (report these to your doctor or health care professional if they continue or are bothersome): pain, redness, or irritation at site where injected This list may not describe all possible side effects. Call your doctor for medical advice about side effects. You may report side effects to FDA at 1-800-FDA-1088. Where should I keep my medication? Keep out of the reach of children. You will be instructed on how to store this medicine. Throw away any unused medicine after the expiration date on the label. NOTE: This sheet is a summary. It may not cover all possible information. If you have questions about this medicine, talk to your doctor, pharmacist, or health care provider.  2022 Elsevier/Gold Standard (2017-08-18 00:00:00)

## 2022-01-27 NOTE — Progress Notes (Signed)
Reason for visit: Migraine headache, neurosarcoidosis  [name] [name] is an 56 y.o. female  She has 2 really bad nose bleeds and it was bad. Her nose scabs and having a lot of sinus issues. She has big scabs. Dr, Jannifer Franklin used to give her OTC steroid spray. She says she is stable with her headaches, she goes to pain management for the back, she had surgery on her back, her headaches can be severe, at least 20 headache days a months, they can be mild or range to severe. She has 8 severe headaches a month for > 1 year, she gets dizzy by looking, the light bother her, nausea, movement makes it worse, no vomiting, if she bends over it hurts, positional, she can wake with them in the morning and can last 24 hours. She takes oxycodone for the back pain. She tried Aimovig, we discussed botox, discussed Ajovy. Vision changes. She had a partial hysterectomy.   MRI brain IMPRESSION: personally reviewed and agree (additional 5 minutes in addition to visit) 1. Stable postoperative changes in the posterior fossa. 2. No evidence of acute intracranial abnormality or active neurosarcoidosis. 3. Right maxillary sinus fluid and mucosal thickening, correlate for acute sinusitis.  Patient complains of symptoms per HPI as well as the following symptoms: headache . Pertinent negatives and positives per HPI. All others negative   History of present illness:  [name] [name] is a 56 year old right-handed black female with a history of neurosarcoidosis diagnosed many years ago, the patient has been very stable in this regard for quite a number of years.  The patient was to come off of the methotrexate and last seen, but she apparently felt that her headaches were worse off the medication and restarted the drug on her own.  She takes 2.5 mg once a week.  The patient has had some left shoulder discomfort, she is to have surgery next week for rotator cuff tear.  The patient has been seen for sleep evaluation, she has never  set up the sleep evaluation as ordered.  She is planning on doing this after her shoulder surgery.  She continues to have very frequent headaches, at least 15 headache days a month.  Aimovig helped initially but has not been effective more recently.  I recommended Botox therapy, but the patient is not sure that she wants to have this done.  Her last hemoglobin A1c was 6.1.  She returns to the office today for an evaluation.  Past Medical History:  Diagnosis Date   Abdominal pain 06/25/2010   CT abd/pelvis 05/2014: Very large amount of stool noted distending the colon. The size consistent with severe constipation/fecal impaction.     Abnormal cervical Papanicolaou smear 04/17/2017   Anemia    Arthritis    lumbar region- radiculopathy   Cancer ([place]) 2013    Thyroid Ca-thyroidectomy   Chronic fatigue    DM (diabetes mellitus) (HCC)    Gait disturbance    GERD (gastroesophageal reflux disease)    Headache(784.0)    Herniated lumbar disc without myelopathy 08/20/2016   History of osteoporosis 05/25/2007   DEXA 2014 Since the baseline examination in 2002, there has been a  statistically significant 19.5% increase in bone mineral density in  the lumbar spine and a statistically significant 14.6% increase in  bone mineral density in the left femur.   DEXA 2002 with osteoporosis of lumbar spine and moderate osteopenia of left hip MRI 2002 left hip negative for avascular necrosis  Qualifier: Diagnosis  o   HTN (hypertension)    Hypothyroidism    Insomnia 01/02/2015   Insomnia 01/02/2015   Intermittent left-sided chest pain 01/26/2013   Left sided numbness 12/10/2018   Nephrolithiasis    Obesity    OSA (obstructive sleep apnea)    last test- many yrs. ago, done at [facility], no CPAP in use   Osteoporosis    Pain and swelling of lower leg, left 12/16/2017   Peripheral neuropathy    Prolonged QT interval    Rectocele 07/23/2020   [facility]   Sarcoidosis    sarcoidosis in Brain, treated with surgery     Past Surgical History:  Procedure Laterality Date   BRAIN SURGERY  2001   for sarcoidosis   BREAST BIOPSY Left    BREAST BIOPSY Right    BREAST EXCISIONAL BIOPSY Right    x2 benign   BREAST EXCISIONAL BIOPSY Left    benign   BREAST SURGERY Bilateral    biopsies    DILATION AND CURETTAGE OF UTERUS     HYSTEROSCOPY     LUMBAR LAMINECTOMY/DECOMPRESSION MICRODISCECTOMY Left 08/20/2016   Procedure: Left Lumbar four-five Microdiskectomy;  Surgeon: Leeroy Cha, MD;  Location: [facility];  Service: Neurosurgery;  Laterality: Left;   PARTIAL HYSTERECTOMY  2013   ROTATOR CUFF REPAIR Right    07/2013   Suboccipital craniotomy     THYROIDECTOMY     Precancerous lesion    Family History  Problem Relation Age of Onset   Heart disease Mother        questionable CAD and arrythmias    Cancer Mother        Breast cancer   Breast cancer Mother 58   Venous thrombosis Sister    Diabetes Sister    Heart disease Sister    Diabetes Brother    Heart disease Brother    Cancer Maternal Aunt    Breast cancer Maternal Aunt    Venous thrombosis Sister    Diabetes Sister    Heart disease Sister     Social history:  reports that she has never smoked. She has never used smokeless tobacco. She reports that she does not drink alcohol and does not use drugs.    Allergies  Allergen Reactions   Lisinopril Cough    Medications:  Prior to Admission medications   Medication Sig Start Date End Date Taking? Authorizing Provider  ACCU-CHEK GUIDE test strip USE AS INSTRUCTED TO CHECK BLOOD SUGAR UP TO 1 TIME A DAY 05/13/21  Yes Sanjuan Dame, MD  albuterol (VENTOLIN HFA) 108 (90 Base) MCG/ACT inhaler Inhale 2 puffs into the lungs every 6 (six) hours as needed for wheezing or shortness of breath. 05/16/20  Yes Earlene Plater, MD  aspirin 81 MG chewable tablet Chew 81 mg by mouth daily.   Yes [provider]  atorvastatin (LIPITOR) 20 MG tablet Take 1 tablet (20 mg total) by mouth  daily. 08/30/20  Yes Gaylan Gerold, DO  B-D ULTRAFINE III SHORT PEN 31G X 8 MM MISC USE TO INJECT INSULIN 1 TIME DAILY DIAG CODE E11.9 INSULIN DEPENDENT 03/12/21  Yes Aslam, Loralyn Freshwater, MD  blood glucose meter kit and supplies KIT Dispense based on patient and insurance preference. Use up to four times daily as directed. (FOR ICD-9 250.00, 250.01). 06/15/19  Yes Agyei, Obed K, MD  carvedilol (COREG) 6.25 MG tablet Take 1 tablet (6.25 mg total) by mouth 2 (two) times daily. 12/21/19  Yes Lelon Perla, MD  Dulaglutide (TRULICITY) 3.27 MD/4.7WL  SOPN Inject 0.75 mg into the skin once a week. 04/04/21  Yes Aldine Contes, MD  DULoxetine (CYMBALTA) 30 MG capsule duloxetine 30 mg capsule,delayed release  TAKE 1 CAPSULE BY MOUTH EVERY DAY   Yes [provider]  empagliflozin (JARDIANCE) 10 MG TABS tablet Take 1 tablet (10 mg total) by mouth daily. 02/19/21  Yes Axel Filler, MD  Erenumab-aooe (AIMOVIG) 140 MG/ML SOAJ Inject 140 mg into the skin every 30 (thirty) days. 05/09/20  Yes Kathrynn Ducking, MD  fluticasone (FLONASE) 50 MCG/ACT nasal spray PLACE 1 SPRAY INTO BOTH NOSTRILS DAILY. 05/13/21 05/13/22 Yes Sanjuan Dame, MD  folic acid (FOLVITE) 1 MG tablet TAKE 1 TABLET BY MOUTH EVERY DAY 02/04/21  Yes Gaylan Gerold, DO  levothyroxine (SYNTHROID) 137 MCG tablet TAKE 1 TABLET BY MOUTH EVERY DAY BEFORE BREAKFAST 08/23/20  Yes Aldine Contes, MD  losartan (COZAAR) 50 MG tablet TAKE 1 TABLET BY MOUTH EVERY DAY 02/15/21  Yes Sanjuan Dame, MD  metFORMIN (GLUCOPHAGE) 1000 MG tablet TAKE 1 TABLET BY MOUTH TWICE A DAY WITH MEALS 03/19/21  Yes Mosetta Anis, MD  Multiple Vitamin (MULTIVITAMIN WITH MINERALS) TABS tablet Take 1 tablet by mouth every evening.    Yes [provider]  oxyCODONE-acetaminophen (PERCOCET) 10-325 MG tablet Take 1 tablet by mouth every 4 (four) hours as needed for pain.   Yes [provider]  pantoprazole (PROTONIX) 20 MG tablet Take 1 tablet (20 mg total)  by mouth daily. 08/29/20  Yes Gaylan Gerold, DO  diazepam (VALIUM) 5 MG tablet Take 1 tablet one hour prior to scan 12/21/19   Lelon Perla, MD     Physical exam: Exam: Gen: NAD, conversant, well nourised, obese, well groomed                     CV: RRR, no MRG. No Carotid Bruits. No peripheral edema, warm, nontender Eyes: Conjunctivae clear without exudates or hemorrhage  Neuro: Detailed Neurologic Exam  Speech:    Speech is normal; fluent and spontaneous with normal comprehension.  Cognition:    The patient is oriented to person, place, and time;     recent and remote memory intact;     language fluent;     normal attention, concentration,     fund of knowledge Cranial Nerves:    The pupils are equal, round, and reactive to light. The fundi are flat. Visual fields are full to finger confrontation. Extraocular movements are intact. Trigeminal sensation is intact and the muscles of mastication are normal. The face is symmetric. The palate elevates in the midline. Hearing intact. Voice is normal. Shoulder shrug is normal. The tongue has normal motion without fasciculations.   Coordination:    Normal finger to nose   Gait:    Imbalance on tandem, normal gait  Motor Observation:    No asymmetry, no atrophy, and no involuntary movements noted. Tone:    Normal muscle tone.    Posture:    Posture is normal. normal erect    Strength:    Strength is V/V in the upper and lower limbs.      Sensation: intact to LT     Reflex Exam:  DTR's:    Deep tendon reflexes in the upper and lower extremities are symmetrical bilaterally.   Toes:    The toes are equiv bilaterally.   Clonus:    Clonus is absent.   Assessment/Plan: 56 year old with hx of neurosarcoidosis, worsening headaches and migraines, chronic  migraines, failed multiple medications.   1.  Neurosarcoidosis, worsening headaches, repeat MRI for surveillance of neurosarcoidosis  2.  Migraine headache, intractable,  failed aimovig, try Ajovy for prevention. Triptans containdicated due to long QT and risk of arrhythmias will order ubrelvy  3, scabbing in nose, nosebleeds, sinusitis/drainage, refer to ENT  4. She declines botox  Orders Placed This Encounter  Procedures   MR BRAIN W WO CONTRAST   CBC with Differential/Platelets   Comprehensive metabolic panel   TSH   Ambulatory referral to ENT   Meds ordered this encounter  Medications   Ubrogepant (UBRELVY) 100 MG TABS    Sig: Take 100 mg by mouth every 2 (two) hours as needed. Maximum 210m a day.    Dispense:  16 tablet    Refill:  11   Fremanezumab-vfrm (AJOVY) 225 MG/1.5ML SOAJ    Sig: Inject 225 mg into the skin every 30 (thirty) days.    Dispense:  1.5 mL    Refill:  [address] [facility] [address] [place] [address] Phone [phone] [phone] I spent over 40 minutes of face-to-face and non-face-to-face time with patient on the  1. Neurosarcoidosis   2. Chronic migraine without aura without status migrainosus, not intractable   3. Positional headache   4. Vision changes   5. Imbalance   6. Worsening headaches   7. Morning headache   8. Long-term use of high-risk medication   9. Sinusitis, unspecified chronicity, unspecified location   10. Bleeding from the nose    diagnosis.  This included previsit chart review, lab review, study review, order entry, electronic health record documentation, patient education on the different diagnostic and therapeutic options, counseling and coordination of care, risks and benefits of management, compliance, or risk factor reduction

## 2022-01-28 ENCOUNTER — Telehealth: Payer: Self-pay | Admitting: Neurology

## 2022-01-28 ENCOUNTER — Telehealth: Payer: Self-pay | Admitting: *Deleted

## 2022-01-28 LAB — COMPREHENSIVE METABOLIC PANEL
ALT: 17 IU/L (ref 0–32)
AST: 20 IU/L (ref 0–40)
Albumin/Globulin Ratio: 1.6 (ref 1.2–2.2)
Albumin: 4.7 g/dL (ref 3.8–4.9)
Alkaline Phosphatase: 91 IU/L (ref 44–121)
BUN/Creatinine Ratio: 25 — ABNORMAL HIGH (ref 9–23)
BUN: 18 mg/dL (ref 6–24)
Bilirubin Total: 0.2 mg/dL (ref 0.0–1.2)
CO2: 24 mmol/L (ref 20–29)
Calcium: 9.7 mg/dL (ref 8.7–10.2)
Chloride: 104 mmol/L (ref 96–106)
Creatinine, Ser: 0.73 mg/dL (ref 0.57–1.00)
Globulin, Total: 3 g/dL (ref 1.5–4.5)
Glucose: 99 mg/dL (ref 70–99)
Potassium: 4.9 mmol/L (ref 3.5–5.2)
Sodium: 143 mmol/L (ref 134–144)
Total Protein: 7.7 g/dL (ref 6.0–8.5)
eGFR: 98 mL/min/{1.73_m2} (ref >59)

## 2022-01-28 LAB — CBC WITH DIFFERENTIAL/PLATELET
Basophils Absolute: 0 10*3/uL (ref 0.0–0.2)
Basos: 1 %
EOS (ABSOLUTE): 0.1 10*3/uL (ref 0.0–0.4)
Eos: 2 %
Hematocrit: 40.2 % (ref 34.0–46.6)
Hemoglobin: 12.8 g/dL (ref 11.1–15.9)
Immature Grans (Abs): 0 10*3/uL (ref 0.0–0.1)
Immature Granulocytes: 0 %
Lymphocytes Absolute: 2 10*3/uL (ref 0.7–3.1)
Lymphs: 35 %
MCH: 27 pg (ref 26.6–33.0)
MCHC: 31.8 g/dL (ref 31.5–35.7)
MCV: 85 fL (ref 79–97)
Monocytes Absolute: 0.5 10*3/uL (ref 0.1–0.9)
Monocytes: 8 %
Neutrophils Absolute: 3.1 10*3/uL (ref 1.4–7.0)
Neutrophils: 54 %
Platelets: 319 10*3/uL (ref 150–450)
RBC: 4.74 x10E6/uL (ref 3.77–5.28)
RDW: 13.8 % (ref 11.7–15.4)
WBC: 5.7 10*3/uL (ref 3.4–10.8)

## 2022-01-28 LAB — TSH: TSH: 0.106 u[IU]/mL — ABNORMAL LOW (ref 0.450–4.500)

## 2022-01-28 NOTE — Telephone Encounter (Signed)
Completed [name] PA on Cover My Meds. Key: [id number]. Awaiting determination from [facility].

## 2022-01-28 NOTE — Telephone Encounter (Addendum)
Referral sent to [facility] [phone].

## 2022-01-28 NOTE — Telephone Encounter (Signed)
[facility] Josem Kaufmann: [id number] (exp. 01/28/22 to 03/29/22) order sent to GI, they will reach out to the patient to schedule.

## 2022-01-28 NOTE — Telephone Encounter (Signed)
[facility] pending faxed notes

## 2022-01-29 DIAGNOSIS — Z419 Encounter for procedure for purposes other than remedying health state, unspecified: Secondary | ICD-10-CM | POA: Diagnosis not present

## 2022-01-30 DIAGNOSIS — M5136 Other intervertebral disc degeneration, lumbar region: Secondary | ICD-10-CM | POA: Diagnosis not present

## 2022-01-30 DIAGNOSIS — M79662 Pain in left lower leg: Secondary | ICD-10-CM | POA: Diagnosis not present

## 2022-01-30 DIAGNOSIS — M25512 Pain in left shoulder: Secondary | ICD-10-CM | POA: Diagnosis not present

## 2022-01-30 DIAGNOSIS — G894 Chronic pain syndrome: Secondary | ICD-10-CM | POA: Diagnosis not present

## 2022-01-30 DIAGNOSIS — M961 Postlaminectomy syndrome, not elsewhere classified: Secondary | ICD-10-CM | POA: Diagnosis not present

## 2022-01-30 DIAGNOSIS — G8929 Other chronic pain: Secondary | ICD-10-CM | POA: Diagnosis not present

## 2022-01-31 ENCOUNTER — Other Ambulatory Visit: Payer: Self-pay | Admitting: Cardiology

## 2022-01-31 DIAGNOSIS — I420 Dilated cardiomyopathy: Secondary | ICD-10-CM

## 2022-02-07 ENCOUNTER — Ambulatory Visit: Payer: Medicaid Other | Admitting: Student

## 2022-02-07 ENCOUNTER — Encounter: Payer: Self-pay | Admitting: Student

## 2022-02-07 VITALS — BP 137/67 | HR 86 | Temp 98.1°F | Ht 68.0 in | Wt 200.8 lb

## 2022-02-07 DIAGNOSIS — R7303 Prediabetes: Secondary | ICD-10-CM

## 2022-02-07 DIAGNOSIS — N816 Rectocele: Secondary | ICD-10-CM | POA: Diagnosis not present

## 2022-02-07 DIAGNOSIS — R109 Unspecified abdominal pain: Secondary | ICD-10-CM | POA: Diagnosis not present

## 2022-02-07 DIAGNOSIS — K5902 Outlet dysfunction constipation: Secondary | ICD-10-CM | POA: Diagnosis not present

## 2022-02-07 DIAGNOSIS — E039 Hypothyroidism, unspecified: Secondary | ICD-10-CM | POA: Diagnosis not present

## 2022-02-07 DIAGNOSIS — Z13220 Encounter for screening for lipoid disorders: Secondary | ICD-10-CM | POA: Diagnosis not present

## 2022-02-07 DIAGNOSIS — Z0000 Encounter for general adult medical examination without abnormal findings: Secondary | ICD-10-CM

## 2022-02-07 NOTE — Patient Instructions (Signed)
[name], it was a pleasure seeing you today! ? ?Today we discussed: ?- I have placed referrals to urogynecology and physical therapy for your constipation and abdominal pain. ? ?- I will call you on Monday for the results of the lab work. ? ?I have ordered the following labs today: ? ? ?Lab Orders    ?     T4, Free    ?     Hemoglobin A1c    ?     Lipid Profile     ? ?Referrals ordered today:  ? ? ?Referral Orders    ?     Ambulatory referral to Urogynecology    ?     Ambulatory referral to Physical Therapy     ? ? ?Follow-up: 3 months  ? ?Please make sure to arrive 15 minutes prior to your next appointment. If you arrive late, you may be asked to reschedule.  ? ?We look forward to seeing you next time. Please call our clinic at [phone] if you have any questions or concerns. The best time to call is Monday-Friday from 9am-4pm, but there is someone available 24/7. If after hours or the weekend, call the main hospital number and ask for the Internal Medicine Resident On-Call. If you need medication refills, please notify your pharmacy one week in advance and they will send [name] a request. ? ?Thank you for letting us take part in your care. Wishing you the best! ? ?Thank you, ?Sanjuan Dame, MD ?

## 2022-02-08 LAB — LIPID PANEL
Chol/HDL Ratio: 2.7 ratio (ref 0.0–4.4)
Cholesterol, Total: 120 mg/dL (ref 100–199)
HDL: 44 mg/dL (ref >39)
LDL Chol Calc (NIH): 59 mg/dL (ref 0–99)
Triglycerides: 87 mg/dL (ref 0–149)
VLDL Cholesterol Cal: 17 mg/dL (ref 5–40)

## 2022-02-08 LAB — T4, FREE: Free T4: 1.94 ng/dL — ABNORMAL HIGH (ref 0.82–1.77)

## 2022-02-08 LAB — HEMOGLOBIN A1C
Est. average glucose Bld gHb Est-mCnc: 134 mg/dL
Hgb A1c MFr Bld: 6.3 % — ABNORMAL HIGH (ref 4.8–5.6)

## 2022-02-09 DIAGNOSIS — R109 Unspecified abdominal pain: Secondary | ICD-10-CM | POA: Insufficient documentation

## 2022-02-09 DIAGNOSIS — K5902 Outlet dysfunction constipation: Secondary | ICD-10-CM | POA: Insufficient documentation

## 2022-02-09 NOTE — Assessment & Plan Note (Addendum)
Patient previously had work-up for chronic constipation and nausea. Defecogram in August 2021 revealed anterior rectocele with significant post-evacuation residual. She was referred to urogynecology previously, but never followed up. Discussed importance of following up to potentially improve symptoms, patient verbalized understanding. ? ?- Urogynecology referral placed ?

## 2022-02-09 NOTE — Assessment & Plan Note (Addendum)
Patient is presenting today to discuss constipation, nausea, and recent acute abdominal pain. She reports initial symptoms started in 2020 without known inciting event. Since this time she reports being able to eat only small amounts of food before becoming nauseous. Describes eating only one banana, a few crackers, and mashed potatoes in one day. When patient has urge to defecate, she has to physically move her stomach with her hands in order to have a bowel movement. Patient typically has 1-2 bowel movements daily. Describes her bowel movements as small, hard balls, brown in color without melena or blood. [name] [name] has tried using Miralax, Metamucil, and magnesium citrate without relief of symptoms. She mentions she has been losing weight because of how little she is able to eat. She was referred to [name] [name] with GI and underwent anorectal manometry and defecogram. 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 Patient did not follow-up with pelvic floor retraining as she felt this was not the cause of her symptoms. She did see [name] [name] twice, last in 10/2021 where she underwent endoscopic vacuum therapy. [name] [name] has not returned as her insurance no longer pays for this. ? ? Discussed with [name] [name] that it will be important to follow-up with pelvic floor retraining in addition to urogyn for rectocele. Unclear whether primary GI pathology that led to rectocele vs rectocele causing weakness of anal sphincters leading to dyssynergia. She has no B symptoms, melena, or lymphadenopathy. Patient reports her insurance no longer covers [name] [name] GI office any longer. Will need to investigate this further, consider another referral to GI pending follow-up with urogyn. ? ?-Pelvic floor physical therapy referral ?-Urogynecology referral ? ?

## 2022-02-09 NOTE — Assessment & Plan Note (Addendum)
Most recent TSH 0.106 last month, will follow-up free T4 today. Patient compliant with daily Synthroid 122mg daily. ? ?ADDENDUM: ?Free T4 elevated 1.9. Patient has plenty of current dose still, will decrease to six doses weekly until bottle runs out. Then will start at Synthroid 1256m daily. Patient verbalized understanding and was able to repeat directions back. ? ?- Decrease Synthroid 13744mto six times weekly ?- After finished, start Synthroid 125m37maily ?- Repeat TSH in 6-8 weeks ?

## 2022-02-09 NOTE — Assessment & Plan Note (Signed)
Patient reports bilateral abdominal pain over the last week. She reports achy, sharp pain without any known aggravating or relieving factors. It has not been associated with eating, drinking, physical movement, urination, bowel movements. Patient does have loose abdominal wall skin from recent weight loss.  ? ?Pain most consistent with abdominal wall pain. No systemic symptoms or associated with urination, bowel movements, or eating. Discussed taking Tylenol for this and returning to clinic in the next few weeks if symptoms persist. ? ?- Tylenol '650mg'$  every 6 hours as needed ?- Return to clinic if symptoms persist  ?

## 2022-02-09 NOTE — Progress Notes (Signed)
? ?CC: abdominal pain ? ?HPI: ? ?[name] [name] is a 56 y.o. person with medical history as below presenting to [facility] for abdominal pain. ? ?Please see problem-based list for further details, assessments, and plans. ? ?Past Medical History:  ?Diagnosis Date  ? Abdominal pain 06/25/2010  ? CT abd/pelvis 05/2014: Very large amount of stool noted distending the colon. The size consistent with severe constipation/fecal impaction.    ? Abnormal cervical Papanicolaou smear 04/17/2017  ? Anemia   ? Arthritis   ? lumbar region- radiculopathy  ? Cancer [facility]) 2013  ?  Thyroid Ca-thyroidectomy  ? Chronic fatigue   ? DM (diabetes mellitus) ([place])   ? Gait disturbance   ? GERD (gastroesophageal reflux disease)   ? Headache(784.0)   ? Herniated lumbar disc without myelopathy 08/20/2016  ? History of osteoporosis 05/25/2007  ? DEXA 2014 Since the baseline examination in 2002, there has been a  statistically significant 19.5% increase in bone mineral density in  the lumbar spine and a statistically significant 14.6% increase in  bone mineral density in the left femur.   DEXA 2002 with osteoporosis of lumbar spine and moderate osteopenia of left hip MRI 2002 left hip negative for avascular necrosis  Qualifier: Diagnosis o  ? HTN (hypertension)   ? Hypothyroidism   ? Insomnia 01/02/2015  ? Insomnia 01/02/2015  ? Intermittent left-sided chest pain 01/26/2013  ? Left sided numbness 12/10/2018  ? Nephrolithiasis   ? Obesity   ? OSA (obstructive sleep apnea)   ? last test- many yrs. ago, done at [facility], no CPAP in use  ? Osteoporosis   ? Pain and swelling of lower leg, left 12/16/2017  ? Peripheral neuropathy   ? Prolonged QT interval   ? Rectocele 07/23/2020  ? [facility]  ? Sarcoidosis   ? 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 ?Review of Systems:  As per HPI ? ?Physical Exam: ? ?Vitals:  ? 02/07/22 0939  ?BP: 137/67  ?Pulse: 86  ?Temp: 98.1 ?F (36.7 ?C)  ?TempSrc: Oral  ?SpO2: 100%  ?Weight: 200 lb 12.8 oz (91.1 kg)  ?Height: '5\' 8"'$   (1.727 m)  ? ?General: Resting comfortably in no acute distress. ?HENT: Normocephalic, atraumatic. No cervical, auricular, supraclavicular lymphadenopathy.  ?CV: Regular rate, rhythm. No murmurs appreciated.  ?Pulm: Normal respiratory effort on room air. Clear to ausculation bilaterally. ?GI: Soft, non-distended, mild tenderness bilateral anterior flanks. Negative Murphy's. No rebound or guarding. Normoactive bowel sounds. ?MSK: Normal bulk, tone. ?Skin: Loose skin on abdomen, no rashes or lesions appreciated. ?Neuro: Awake, alert, conversing appropriately. ?Psych: Normal mood, affect, speech. ? ?Assessment & Plan:  ? ?Rectocele ?Patient previously had work-up for chronic constipation and nausea. Defecogram in August 2021 revealed anterior rectocele with significant post-evacuation residual. She was referred to urogynecology previously, but never followed up. Discussed importance of following up to potentially improve symptoms, patient verbalized understanding. ? ?- Urogynecology referral placed ? ?Dyssynergic defecation ?Patient is presenting today to discuss constipation, nausea, and recent acute abdominal pain. She reports initial symptoms started in 2020 without known inciting event. Since this time she reports being able to eat only small amounts of food before becoming nauseous. Describes eating only one banana, a few crackers, and mashed potatoes in one day. When patient has urge to defecate, she has to physically move her stomach with her hands in order to have a bowel movement. Patient typically has 1-2 bowel movements daily. Describes her bowel movements as small, hard balls, brown in color without  melena or blood. [name] [name] has tried using Miralax, Metamucil, and magnesium citrate without relief of symptoms. She mentions she has been losing weight because of how little she is able to eat. She was referred to Dr. Darrick Grinder with GI and underwent anorectal manometry and defecogram. 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 Patient did not follow-up with pelvic floor retraining as she felt this was not the cause of her symptoms. She did see Dr. Benson Norway twice, last in 10/2021 where she underwent endoscopic vacuum therapy. [name] [name] has not returned as her insurance no longer pays for this. ? ? Discussed with [name] [name] that it will be important to follow-up with pelvic floor retraining in addition to urogyn for rectocele. Unclear whether primary GI pathology that led to rectocele vs rectocele causing weakness of anal sphincters leading to dyssynergia. She has no B symptoms, melena, or lymphadenopathy. Patient reports her insurance no longer covers Dr. Ulyses Amor GI office any longer. Will need to investigate this further, consider another referral to GI pending follow-up with urogyn. ? ?-Pelvic floor physical therapy referral ?-Urogynecology referral ? ? ?Abdominal wall pain in flank ?Patient reports bilateral abdominal pain over the last week. She reports achy, sharp pain without any known aggravating or relieving factors. It has not been associated with eating, drinking, physical movement, urination, bowel movements. Patient does have loose abdominal wall skin from recent weight loss.  ? ?Pain most consistent with abdominal wall pain. No systemic symptoms or associated with urination, bowel movements, or eating. Discussed taking Tylenol for this and returning to clinic in the next few weeks if symptoms persist. ? ?- Tylenol '650mg'$  every 6 hours as needed ?- Return to clinic if symptoms persist  ? ?Healthcare maintenance ?- Will repeat lipid panel and A1c today ? ?Hypothyroidism ?Most recent TSH 0.106 last month, will follow-up free T4 today. Patient compliant with daily Synthroid 14mg daily. ? ?Patient discussed with Dr.  MCain Sieve? ?PSanjuan Dame MD ?Internal Medicine PGY-2 ?Pager: [phone]? ?

## 2022-02-09 NOTE — Assessment & Plan Note (Signed)
-   Will repeat lipid panel and A1c today ?

## 2022-02-10 ENCOUNTER — Other Ambulatory Visit: Payer: Self-pay | Admitting: Internal Medicine

## 2022-02-10 DIAGNOSIS — Z1231 Encounter for screening mammogram for malignant neoplasm of breast: Secondary | ICD-10-CM

## 2022-02-10 NOTE — Addendum Note (Signed)
Addended bySanjuan Dame on: 02/10/2022 03:53 PM ? ? Modules accepted: Orders ? ?

## 2022-02-10 NOTE — Telephone Encounter (Signed)
Received a fax from [facility] stating [name] [name] has been denied.  Patient is not allowed to have more than or equal to 15 headache days per month during the prior 6 months.  ?

## 2022-02-11 NOTE — Therapy (Signed)
?OUTPATIENT PHYSICAL THERAPY FEMALE PELVIC EVALUATION ? ? ?Patient Name: [name] [name] ?MRN: [id number] ?DOB:Jan 26, 1966, 56 y.o., female ?Today's Date: 02/11/2022 ? ? ? ?Past Medical History:  ?Diagnosis Date  ? Abdominal pain 06/25/2010  ? CT abd/pelvis 05/2014: Very large amount of stool noted distending the colon. The size consistent with severe constipation/fecal impaction.    ? Abnormal cervical Papanicolaou smear 04/17/2017  ? Anemia   ? Arthritis   ? lumbar region- radiculopathy  ? Cancer [facility]) 2013  ?  Thyroid Ca-thyroidectomy  ? Chronic fatigue   ? DM (diabetes mellitus) ([place])   ? Gait disturbance   ? GERD (gastroesophageal reflux disease)   ? Headache(784.0)   ? Herniated lumbar disc without myelopathy 08/20/2016  ? History of osteoporosis 05/25/2007  ? DEXA 2014 Since the baseline examination in 2002, there has been a  statistically significant 19.5% increase in bone mineral density in  the lumbar spine and a statistically significant 14.6% increase in  bone mineral density in the left femur.   DEXA 2002 with osteoporosis of lumbar spine and moderate osteopenia of left hip MRI 2002 left hip negative for avascular necrosis  Qualifier: Diagnosis o  ? HTN (hypertension)   ? Hypothyroidism   ? Insomnia 01/02/2015  ? Insomnia 01/02/2015  ? Intermittent left-sided chest pain 01/26/2013  ? Left sided numbness 12/10/2018  ? Nephrolithiasis   ? Obesity   ? OSA (obstructive sleep apnea)   ? last test- many yrs. ago, done at [facility], no CPAP in use  ? Osteoporosis   ? Pain and swelling of lower leg, left 12/16/2017  ? Peripheral neuropathy   ? Prolonged QT interval   ? Rectocele 07/23/2020  ? [facility]  ? Sarcoidosis   ? 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 ?Past Surgical History:  ?Procedure Laterality Date  ? BRAIN SURGERY  2001  ? for sarcoidosis  ? BREAST BIOPSY Left   ? BREAST BIOPSY Right   ? BREAST EXCISIONAL BIOPSY Right   ? x2 benign  ? BREAST EXCISIONAL BIOPSY Left   ? benign  ? BREAST SURGERY Bilateral    ? biopsies   ? DILATION AND CURETTAGE OF UTERUS    ? HYSTEROSCOPY    ? LUMBAR LAMINECTOMY/DECOMPRESSION MICRODISCECTOMY Left 08/20/2016  ? Procedure: Left Lumbar four-five Microdiskectomy;  Surgeon: Leeroy Cha, MD;  Location: [place] [facility];  Service: Neurosurgery;  Laterality: Left;  ? PARTIAL HYSTERECTOMY  2013  ? ROTATOR CUFF REPAIR Right   ? 07/2013  ? Suboccipital craniotomy    ? THYROIDECTOMY    ? Precancerous lesion  ? ?Patient Active Problem List  ? Diagnosis Date Noted  ? Dyssynergic defecation 02/09/2022  ? Abdominal wall pain in flank 02/09/2022  ? Chronic migraine without aura without status migrainosus, not intractable 01/27/2022  ? Epistaxis 12/30/2021  ? Closed fracture of multiple pubic rami ([place]) 06/13/2021  ? Osteoporosis 06/13/2021  ? Women's annual routine gynecological examination 05/13/2021  ? History of abdominal hysterectomy 05/13/2021  ? Rectocele 05/13/2021  ? Cough 05/16/2020  ? Neck mass 02/15/2020  ? 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 Chronic constipation 08/24/2019  ? Gluteal pain 05/24/2019  ? Chronic left shoulder pain 01/30/2019  ? Obesity 11/24/2016  ? Healthcare maintenance 02/18/2016  ? Common migraine with intractable migraine 02/09/2013  ? Hypothyroidism 05/08/2011  ? Hyperlipidemia 01/28/2010  ? Sciatica of left side associated with disorder of lumbar spine 10/20/2007  ? 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 Controlled diabetes mellitus type 2, ([place]) 05/25/2007  ?  OBSTRUCTIVE SLEEP APNEA 05/25/2007  ? Essential hypertension 05/25/2007  ? Gastroesophageal reflux disease 05/25/2007  ? LONG QT SYNDROME 05/25/2007  ? ? ?PCP: Scarlett Presto, MD ? ?REFERRING PROVIDER: Velna Ochs, MD ? ?REFERRING DIAG: N81.6 (ICD-10-CM) - Rectocele ? ?THERAPY DIAG:  ?No diagnosis found. ? ?ONSET DATE: 12/02/2015 ? ?SUBJECTIVE:                                                                                                                                                                                           ? ?SUBJECTIVE STATEMENT: ?Patient states that she is having a lot of issues with bowel movements for years; she has been told that she needs to have surgery to repair. She states that when she feels fullness in rectum she will feel very nauseous and is having trouble eating. Pt states that after having shoulder surgery, she was diagnosed with multiple pelvic fractures with unknown cause - she thinks all fractures were on Rt side of pelvis. She is cleared for all activities now.  ?Fluid intake: Yes: 48oz of water   ? ?Patient confirms identification and approves PT to assess pelvic floor and treatment Yes ? ?PERTINENT HISTORY:  ?Multiple fractures of pubic ramus ?Sexual abuse: No ? ?PAIN:  ?Are you having pain? No ? ? ?BOWEL MOVEMENT ?Pain with bowel movement: No - prior to bowel movement and then sometimes during - she feels like she has to lift up stomach to let bowel movement come out. ?Type of bowel movement:Frequency periods of having small one daily and then periods of going a week without one, Strain Yes, and Splinting sometimes, but more often on abdomen ?Fully empty rectum: No ?Leakage: No ?Pads: No ?Fiber supplement: No ? ?URINATION ?Pain with urination: No ?Fully empty bladder: No ?Stream: Strong ?Urgency: No ?Frequency: every 3 hours ?Leakage:  none ?Pads: No ? ?INTERCOURSE ?Pain with intercourse:  none ?Ability to have vaginal penetration:  Yes: - ?Types of stimulation: - ?[place]ax: Yes: - ?Marinoff Scale  ? ?PREGNANCY ?Vaginal deliveries 0 ?Tearing No ?C-section deliveries 5 ?Currently pregnant No ? ? ?PRECAUTIONS: None ? ?WEIGHT BEARING RESTRICTIONS No ? ?FALLS:  ?Has patient fallen in last 6 months? No, Number of falls: 0 ? ?LIVING ENVIRONMENT: ?Lives with: lives alone ?Lives in: House/apartment ? ?OCCUPATION: disabled ? ?PLOF: Independent ? ?PATIENT GOALS to have easier bowel movements ? ? ?OBJECTIVE:  ? ?DIAGNOSTIC FINDINGS:  ?MRI performed 08/2021 to rule out cauda equina;  defecogram demonstrated paradoxical puborectalis contraction/dyssenergia with bearing down; anal manometry demonstrated high tone and weak sphincters ? ?COGNITION: ? Overall cognitive status: Within functional limits for tasks assessed   ? ? ?POSTURE:  ?  Posterior pelvic tilt, decrease hip extension/lumbar lordosis, forward head posture and rounded shoulder ? ?PALPATION: ?Internal Pelvic Floor Vaginal exam: posterior levator ani tightness and discomfort Rt>Lt; Rectal exam: significant puborectalis and coccygeus tightness and discomfort bil ? ?External Perineal Exam WNL ? ? ? ?PELVIC MMT: ?  ?MMT  ?02/11/2022  ?Vaginal 2/5, pelvic floor endurance 5 seconds, 10 repetitions  ?Internal Anal Sphincter 1/5  ?External Anal Sphincter 2/5  ?Puborectalis 2/5  ?Diastasis Recti Not checked  ?(Blank rows = not tested) ? ? ?TONE: ?High ? ?PROLAPSE: ?Unable to assess this treatment session due to dyssnergia of pelvic floor contraction when she is trying to bear down and push; attempted multimodal cues in time we had today to help improve this coordination, but patient unable to achieve; we discussed at length how this has likely contributed to rectocele ? ? ?TODAY'S TREATMENT 02/12/22 ?EVAL  ?Treatment: pt education - see below ? ?Check all possible CPT codes: 50539 - Self Care    ? ?If treatment provided at initial evaluation, no treatment charged due to lack of authorization.    ? ? ?PATIENT EDUCATION:  ?Education details: Extensive pt education performed on pelvic floor anatomy, pressure management, dyssynergia of pelvic floor contraction when bearing down/pushing, squatty potty, relaxed toileting mechanics, splinting/use of Femmeze, and self-bowel massage. ?Person educated: Patient ?Education method: Explanation, Demonstration, Tactile cues, Verbal cues, and Handouts ?Education comprehension: verbalized understanding ? ? ?HOME EXERCISE PROGRAM: ?NA - written handouts given for lifestyle intervention ? ?ASSESSMENT: ? ?CLINICAL  IMPRESSION: ?Patient is a 56 y.o. female who was seen today for physical therapy evaluation and treatment for difficulty with bowel movements. Exam findings notable for pelvic floor/anal sphincter weakness 1-2

## 2022-02-11 NOTE — Progress Notes (Signed)
Internal Medicine Clinic Attending ? ?Case discussed with Dr. Braswell  At the time of the visit.  We reviewed the resident?s history and exam and pertinent patient test results.  I agree with the assessment, diagnosis, and plan of care documented in the resident?s note.  ?

## 2022-02-12 ENCOUNTER — Ambulatory Visit: Payer: Medicaid Other | Attending: Internal Medicine

## 2022-02-12 ENCOUNTER — Other Ambulatory Visit: Payer: Self-pay

## 2022-02-12 DIAGNOSIS — N816 Rectocele: Secondary | ICD-10-CM | POA: Insufficient documentation

## 2022-02-12 DIAGNOSIS — R279 Unspecified lack of coordination: Secondary | ICD-10-CM | POA: Insufficient documentation

## 2022-02-12 DIAGNOSIS — M6281 Muscle weakness (generalized): Secondary | ICD-10-CM | POA: Diagnosis not present

## 2022-02-12 DIAGNOSIS — M62838 Other muscle spasm: Secondary | ICD-10-CM | POA: Insufficient documentation

## 2022-02-12 NOTE — Patient Instructions (Signed)
Squatty potty: ?When your knees are level or below the level of your hips, pelvic floor ?muscles are pressed against rectum, preventing ease of bowel movement. ?By getting knees above the level of the hips, these pelvic floor muscles ?relax, allowing easier passage of bowel movement. ?? Ways to get knees above hips: ?o Physiological scientist (7inch and 9inch versions) ?o Small stool ?o Roll of toilet paper under each foot ?o Hardback book or stack of magazines under each foot ? ?Bowel massage: ?To assist with more regular and more comfortable bowel movements, try ?performing bowel massage nightly for 5-10 minutes. ?Place hands in the lower right side of your abdomen to start; in small ?circles, massage up, across, and down the left side of your abdomen. ?Pressure does not need to be hard, but just comfortable. ?You can use lotion or oil to make more comfortable. ? ?Look into [facility] for splinting or help with having bowel movements; until you get one of these, try taking a small amount of toilet paper and applying pressure to outside of vagina to help improve ease of bowel movement.  ? ? ? ?

## 2022-02-15 ENCOUNTER — Ambulatory Visit
Admission: RE | Admit: 2022-02-15 | Discharge: 2022-02-15 | Disposition: A | Discharge status: Home / Self Care | Payer: Medicaid Other | Source: Ambulatory Visit | Attending: Neurology | Admitting: Neurology

## 2022-02-15 ENCOUNTER — Other Ambulatory Visit: Payer: Self-pay

## 2022-02-15 DIAGNOSIS — D8689 Sarcoidosis of other sites: Secondary | ICD-10-CM

## 2022-02-15 DIAGNOSIS — H539 Unspecified visual disturbance: Secondary | ICD-10-CM | POA: Diagnosis not present

## 2022-02-15 DIAGNOSIS — R519 Headache, unspecified: Secondary | ICD-10-CM

## 2022-02-15 DIAGNOSIS — R51 Headache with orthostatic component, not elsewhere classified: Secondary | ICD-10-CM

## 2022-02-15 DIAGNOSIS — R2689 Other abnormalities of gait and mobility: Secondary | ICD-10-CM

## 2022-02-15 MED ORDER — GADOBENATE DIMEGLUMINE 529 MG/ML IV SOLN
20.0000 mL | Freq: Once | INTRAVENOUS | Status: AC | PRN
  Administered 2022-02-15: 20 mL via INTRAVENOUS

## 2022-02-17 ENCOUNTER — Ambulatory Visit
Admission: RE | Admit: 2022-02-17 | Discharge: 2022-02-17 | Disposition: A | Discharge status: Home / Self Care | Payer: Medicaid Other | Source: Ambulatory Visit | Attending: Internal Medicine | Admitting: Internal Medicine

## 2022-02-17 ENCOUNTER — Other Ambulatory Visit: Payer: Self-pay

## 2022-02-17 DIAGNOSIS — Z1231 Encounter for screening mammogram for malignant neoplasm of breast: Secondary | ICD-10-CM

## 2022-02-27 ENCOUNTER — Ambulatory Visit: Payer: Medicaid Other

## 2022-02-27 DIAGNOSIS — M25512 Pain in left shoulder: Secondary | ICD-10-CM | POA: Diagnosis not present

## 2022-02-27 DIAGNOSIS — G894 Chronic pain syndrome: Secondary | ICD-10-CM | POA: Diagnosis not present

## 2022-02-27 DIAGNOSIS — M62838 Other muscle spasm: Secondary | ICD-10-CM | POA: Diagnosis not present

## 2022-02-27 DIAGNOSIS — R279 Unspecified lack of coordination: Secondary | ICD-10-CM

## 2022-02-27 DIAGNOSIS — M961 Postlaminectomy syndrome, not elsewhere classified: Secondary | ICD-10-CM | POA: Diagnosis not present

## 2022-02-27 DIAGNOSIS — M6281 Muscle weakness (generalized): Secondary | ICD-10-CM

## 2022-02-27 DIAGNOSIS — M5136 Other intervertebral disc degeneration, lumbar region: Secondary | ICD-10-CM | POA: Diagnosis not present

## 2022-02-27 DIAGNOSIS — M79662 Pain in left lower leg: Secondary | ICD-10-CM | POA: Diagnosis not present

## 2022-02-27 DIAGNOSIS — G8929 Other chronic pain: Secondary | ICD-10-CM | POA: Diagnosis not present

## 2022-02-27 DIAGNOSIS — N816 Rectocele: Secondary | ICD-10-CM | POA: Diagnosis not present

## 2022-02-27 NOTE — Therapy (Signed)
?OUTPATIENT PHYSICAL THERAPY TREATMENT NOTE ? ? ?Patient Name: [name] [name] ?MRN: [id number] ?DOB:[birth year], 56 y.o., female ?Today's Date: 02/27/2022 ? ?PCP: Scarlett Presto, MD ?REFERRING PROVIDER: Velna Ochs, MD ? ? PT End of Session - 02/27/22 1619   ? ? Visit Number 2   ? Date for PT Re-Evaluation 05/07/22   ? Authorization Type [facility]   ? Authorization Time Period 02/12/22-04/13/22   ? Authorization - Visit Number 2   ? Authorization - Number of Visits 4   ? PT Start Time [phone]   ? PT Stop Time 1657   ? PT Time Calculation (min) 39 min   ? Activity Tolerance Patient tolerated treatment well   ? Behavior During Therapy [facility] for tasks assessed/performed   ? ?  ?  ? ?  ? ? ?Past Medical History:  ?Diagnosis Date  ? Abdominal pain 06/25/2010  ? CT abd/pelvis 05/2014: Very large amount of stool noted distending the colon. The size consistent with severe constipation/fecal impaction.    ? Abnormal cervical Papanicolaou smear 04/17/2017  ? Anemia   ? Arthritis   ? lumbar region- radiculopathy  ? Cancer [facility]) 2013  ?  Thyroid Ca-thyroidectomy  ? Chronic fatigue   ? DM (diabetes mellitus) ([place])   ? Gait disturbance   ? GERD (gastroesophageal reflux disease)   ? Headache(784.0)   ? Herniated lumbar disc without myelopathy 08/20/2016  ? History of osteoporosis 05/25/2007  ? DEXA 2014 Since the baseline examination in 2002, there has been a  statistically significant 19.5% increase in bone mineral density in  the lumbar spine and a statistically significant 14.6% increase in  bone mineral density in the left femur.   DEXA 2002 with osteoporosis of lumbar spine and moderate osteopenia of left hip MRI 2002 left hip negative for avascular necrosis  Qualifier: Diagnosis o  ? HTN (hypertension)   ? Hypothyroidism   ? Insomnia 01/02/2015  ? Insomnia 01/02/2015  ? Intermittent left-sided chest pain 01/26/2013  ? Left sided numbness 12/10/2018  ? Nephrolithiasis   ? Obesity   ? OSA (obstructive sleep apnea)   ? last test-  many yrs. ago, done at [facility], no CPAP in use  ? Osteoporosis   ? Pain and swelling of lower leg, left 12/16/2017  ? Peripheral neuropathy   ? Prolonged QT interval   ? Rectocele 07/23/2020  ? [facility]  ? Sarcoidosis   ? 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 ?Past Surgical History:  ?Procedure Laterality Date  ? BRAIN SURGERY  2001  ? for sarcoidosis  ? BREAST BIOPSY Left   ? BREAST BIOPSY Right   ? BREAST EXCISIONAL BIOPSY Right   ? x2 benign  ? BREAST EXCISIONAL BIOPSY Left   ? benign  ? BREAST SURGERY Bilateral   ? biopsies   ? DILATION AND CURETTAGE OF UTERUS    ? HYSTEROSCOPY    ? LUMBAR LAMINECTOMY/DECOMPRESSION MICRODISCECTOMY Left 08/20/2016  ? Procedure: Left Lumbar four-five Microdiskectomy;  Surgeon: Leeroy Cha, MD;  Location: [place] [facility];  Service: Neurosurgery;  Laterality: Left;  ? PARTIAL HYSTERECTOMY  2013  ? ROTATOR CUFF REPAIR Right   ? 07/2013  ? Suboccipital craniotomy    ? THYROIDECTOMY    ? Precancerous lesion  ? ?Patient Active Problem List  ? Diagnosis Date Noted  ? Dyssynergic defecation 02/09/2022  ? Abdominal wall pain in flank 02/09/2022  ? Chronic migraine without aura without status migrainosus, not intractable 01/27/2022  ? Epistaxis 12/30/2021  ? Closed fracture  of multiple pubic rami ([place]) 06/13/2021  ? Osteoporosis 06/13/2021  ? Women's annual routine gynecological examination 05/13/2021  ? History of abdominal hysterectomy 05/13/2021  ? Rectocele 05/13/2021  ? Cough 05/16/2020  ? Neck mass 02/15/2020  ? 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 Chronic constipation 08/24/2019  ? Gluteal pain 05/24/2019  ? Chronic left shoulder pain 01/30/2019  ? Obesity 11/24/2016  ? Healthcare maintenance 02/18/2016  ? Common migraine with intractable migraine 02/09/2013  ? Hypothyroidism 05/08/2011  ? Hyperlipidemia 01/28/2010  ? Sciatica of left side associated with disorder of lumbar spine 10/20/2007  ? 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 Controlled diabetes mellitus type 2, ([place])  05/25/2007  ? OBSTRUCTIVE SLEEP APNEA 05/25/2007  ? Essential hypertension 05/25/2007  ? Gastroesophageal reflux disease 05/25/2007  ? LONG QT SYNDROME 05/25/2007  ? ? ?REFERRING DIAG: N81.6 (ICD-10-CM) - Rectocele ? ?THERAPY DIAG:  ?Other muscle spasm ? ?Muscle weakness (generalized) ? ?Unspecified lack of coordination ? ?PERTINENT HISTORY: partial hysterectomy, multiple pelvic fractures, hx cancer ? ?PRECAUTIONS: NA ? ?SUBJECTIVE: Pt states that she has been working on splinting and feels like it is incredibly helpful.  ? ? ?SUBJECTIVE 02/12/22:                                                                                                                                                                                          ?  ?SUBJECTIVE STATEMENT: ?Patient states that she is having a lot of issues with bowel movements for years; she has been told that she needs to have surgery to repair. She states that when she feels fullness in rectum she will feel very nauseous and is having trouble eating. Pt states that after having shoulder surgery, she was diagnosed with multiple pelvic fractures with unknown cause - she thinks all fractures were on Rt side of pelvis. She is cleared for all activities now.  ?Fluid intake: Yes: 48oz of water   ?  ?Patient confirms identification and approves PT to assess pelvic floor and treatment Yes ?  ?PERTINENT HISTORY:  ?Multiple fractures of pubic ramus ?Sexual abuse: No ?  ?PAIN:  ?Are you having pain? No ?  ?  ?BOWEL MOVEMENT ?Pain with bowel movement: No - prior to bowel movement and then sometimes during - she feels like she has to lift up stomach to let bowel movement come out. ?Type of bowel movement:Frequency periods of having small one daily and then periods of going a week without one, Strain Yes, and Splinting sometimes, but more often on abdomen ?Fully empty rectum: No ?Leakage: No ?Pads: No ?Fiber supplement: No ?  ?URINATION ?Pain with urination: No ?Fully empty  bladder: No ?Stream: Strong ?Urgency: No ?Frequency: every 3 hours ?Leakage:  none ?Pads: No ?  ?INTERCOURSE ?Pain with intercourse:  none ?Ability to have vaginal penetration:  Yes: - ?Types of stimulation: - ?Climax: Yes: - ?Marinoff Scale  ?  ?PREGNANCY ?Vaginal deliveries 0 ?Tearing No ?C-section deliveries 5 ?Currently pregnant No ?  ?  ?PATIENT GOALS to have easier bowel movements ?  ?  ?OBJECTIVE 02/12/22:  ?  ?DIAGNOSTIC FINDINGS:  ?MRI performed 08/2021 to rule out cauda equina; defecogram demonstrated paradoxical puborectalis contraction/dyssenergia with bearing down; anal manometry demonstrated high tone and weak sphincters ?  ?COGNITION: ?           Overall cognitive status: Within functional limits for tasks assessed              ?  ?  ?POSTURE:  ?Posterior pelvic tilt, decrease hip extension/lumbar lordosis, forward head posture and rounded shoulder ?  ?PALPATION: ?Internal Pelvic Floor Vaginal exam: posterior levator ani tightness and discomfort Rt>Lt; Rectal exam: significant puborectalis and coccygeus tightness and discomfort bil ?  ?External Perineal Exam WNL ?  ?  ?  ?PELVIC MMT: ?  ?MMT   ?02/11/2022  ?Vaginal 2/5, pelvic floor endurance 5 seconds, 10 repetitions  ?Internal Anal Sphincter 1/5  ?External Anal Sphincter 2/5  ?Puborectalis 2/5  ?Diastasis Recti Not checked  ?(Blank rows = not tested) ?  ?  ?TONE: ?High ?  ?PROLAPSE: ?Unable to assess this treatment session due to dyssnergia of pelvic floor contraction when she is trying to bear down and push; attempted multimodal cues in time we had today to help improve this coordination, but patient unable to achieve; we discussed at length how this has likely contributed to rectocele ?  ?  ?TODAY'S TREATMENT 02/27/22: ?Manual: ?Soft tissue mobilization: ?Scar tissue mobilization:  ?lower abdominal scar tissue ?Myofascial release: ?Bowel massage ?Spinal mobilization: ?Internal pelvic floor techniques: ?Dry needling: ?Neuromuscular re-education: ?Core  retraining:  ?Core facilitation: ?Form correction: ?Pelvic floor contraction training: ?Down training: ?Diaphragmatic breathing 5 min ?Cat/cow 2 x 10 ?Happy baby 10 breaths ?Child's pose 10 breaths ?Exercises: ?S

## 2022-02-27 NOTE — Patient Instructions (Signed)
Scar tissue mobilization: ?The first phase of working on scar tissue is without lotion or oil. ?You will develop a feel for where your scar is restricted, or not moving as ?much as the skin around it. These are the areas you want to focus on. ?Begin by placing fingers on scar tissue; gently press the skin gently in one ?direction. Do not let your fingers slide over the skin, but pull any adhesions ?under it. Maintain pressure for several seconds. ?Perform this technique in different directions over each restricted spot you ?find. ?Pressure does not need to be intense, but only comfortable. ?Perform for 5-10 minutes daily. ? ?Scar tissue desensitization: ?In order to decrease the sensitivity of scar tissue, use oil or lotion (perform ?after scar tissue mobilization). ?Very gently massage over and around scar, not increasing discomfort at all. ?Perform for several minutes daily. ? ? ?Squatty potty!!! ?

## 2022-03-01 DIAGNOSIS — Z419 Encounter for procedure for purposes other than remedying health state, unspecified: Secondary | ICD-10-CM | POA: Diagnosis not present

## 2022-03-08 ENCOUNTER — Other Ambulatory Visit: Payer: Self-pay | Admitting: Cardiology

## 2022-03-08 DIAGNOSIS — I420 Dilated cardiomyopathy: Secondary | ICD-10-CM

## 2022-03-13 ENCOUNTER — Ambulatory Visit: Payer: Medicaid Other | Attending: Internal Medicine

## 2022-03-13 DIAGNOSIS — R279 Unspecified lack of coordination: Secondary | ICD-10-CM | POA: Diagnosis not present

## 2022-03-13 DIAGNOSIS — M62838 Other muscle spasm: Secondary | ICD-10-CM | POA: Insufficient documentation

## 2022-03-13 DIAGNOSIS — M6281 Muscle weakness (generalized): Secondary | ICD-10-CM | POA: Insufficient documentation

## 2022-03-13 NOTE — Patient Instructions (Signed)
Bladder/bowel retraining: ? ?8-12 grams of fiber with each meal ?After each meal, go attempt to have a bowel movement within 5 minutes and don?t sit on the toilet for more than 5 minutes ?Drink 4-8oz an hour ? ?For two weeks. ? ?

## 2022-03-13 NOTE — Therapy (Signed)
?OUTPATIENT PHYSICAL THERAPY TREATMENT NOTE ? ? ?Patient Name: [name] [name] ?MRN: [id number] ?DOB:31-Mar-1966, 56 y.o., female ?Today's Date: 03/13/2022 ? ?PCP: Scarlett Presto, MD ?REFERRING PROVIDER: Velna Ochs, MD ? ? PT End of Session - 03/13/22 1612   ? ? Visit Number 3   ? Date for PT Re-Evaluation 05/07/22   ? Authorization Type wellcare   ? Authorization Time Period 02/12/22-04/13/22   ? Authorization - Visit Number 3   ? Authorization - Number of Visits 4   ? PT Start Time 1615   ? PT Stop Time 1655   ? PT Time Calculation (min) 40 min   ? Activity Tolerance Patient tolerated treatment well   ? Behavior During Therapy [facility] for tasks assessed/performed   ? ?  ?  ? ?  ? ? ? ?Past Medical History:  ?Diagnosis Date  ? Abdominal pain 06/25/2010  ? CT abd/pelvis 05/2014: Very large amount of stool noted distending the colon. The size consistent with severe constipation/fecal impaction.    ? Abnormal cervical Papanicolaou smear 04/17/2017  ? Anemia   ? Arthritis   ? lumbar region- radiculopathy  ? Cancer [facility]) 2013  ?  Thyroid Ca-thyroidectomy  ? Chronic fatigue   ? DM (diabetes mellitus) ([place])   ? Gait disturbance   ? GERD (gastroesophageal reflux disease)   ? Headache(784.0)   ? Herniated lumbar disc without myelopathy 08/20/2016  ? History of osteoporosis 05/25/2007  ? DEXA 2014 Since the baseline examination in 2002, there has been a  statistically significant 19.5% increase in bone mineral density in  the lumbar spine and a statistically significant 14.6% increase in  bone mineral density in the left femur.   DEXA 2002 with osteoporosis of lumbar spine and moderate osteopenia of left hip MRI 2002 left hip negative for avascular necrosis  Qualifier: Diagnosis o  ? HTN (hypertension)   ? Hypothyroidism   ? Insomnia 01/02/2015  ? Insomnia 01/02/2015  ? Intermittent left-sided chest pain 01/26/2013  ? Left sided numbness 12/10/2018  ? Nephrolithiasis   ? Obesity   ? OSA (obstructive sleep apnea)   ? last test-  many yrs. ago, done at [facility], no CPAP in use  ? Osteoporosis   ? Pain and swelling of lower leg, left 12/16/2017  ? Peripheral neuropathy   ? Prolonged QT interval   ? Rectocele 07/23/2020  ? [facility]  ? Sarcoidosis   ? 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 ?Past Surgical History:  ?Procedure Laterality Date  ? BRAIN SURGERY  2001  ? for sarcoidosis  ? BREAST BIOPSY Left   ? BREAST BIOPSY Right   ? BREAST EXCISIONAL BIOPSY Right   ? x2 benign  ? BREAST EXCISIONAL BIOPSY Left   ? benign  ? BREAST SURGERY Bilateral   ? biopsies   ? DILATION AND CURETTAGE OF UTERUS    ? HYSTEROSCOPY    ? LUMBAR LAMINECTOMY/DECOMPRESSION MICRODISCECTOMY Left 08/20/2016  ? Procedure: Left Lumbar four-five Microdiskectomy;  Surgeon: Leeroy Cha, MD;  Location: [place] [facility];  Service: Neurosurgery;  Laterality: Left;  ? PARTIAL HYSTERECTOMY  2013  ? ROTATOR CUFF REPAIR Right   ? 07/2013  ? Suboccipital craniotomy    ? THYROIDECTOMY    ? Precancerous lesion  ? ?Patient Active Problem List  ? Diagnosis Date Noted  ? Dyssynergic defecation 02/09/2022  ? Abdominal wall pain in flank 02/09/2022  ? Chronic migraine without aura without status migrainosus, not intractable 01/27/2022  ? Epistaxis 12/30/2021  ? Closed  fracture of multiple pubic rami ([place]) 06/13/2021  ? Osteoporosis 06/13/2021  ? Women's annual routine gynecological examination 05/13/2021  ? History of abdominal hysterectomy 05/13/2021  ? Rectocele 05/13/2021  ? Cough 05/16/2020  ? Neck mass 02/15/2020  ? 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 Chronic constipation 08/24/2019  ? Gluteal pain 05/24/2019  ? Chronic left shoulder pain 01/30/2019  ? Obesity 11/24/2016  ? Healthcare maintenance 02/18/2016  ? Common migraine with intractable migraine 02/09/2013  ? Hypothyroidism 05/08/2011  ? Hyperlipidemia 01/28/2010  ? Sciatica of left side associated with disorder of lumbar spine 10/20/2007  ? 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 Controlled diabetes mellitus type 2, ([place])  05/25/2007  ? OBSTRUCTIVE SLEEP APNEA 05/25/2007  ? Essential hypertension 05/25/2007  ? Gastroesophageal reflux disease 05/25/2007  ? LONG QT SYNDROME 05/25/2007  ? ? ?REFERRING DIAG: N81.6 (ICD-10-CM) - Rectocele ? ?THERAPY DIAG:  ?Other muscle spasm ? ?Muscle weakness (generalized) ? ?Unspecified lack of coordination ? ?PERTINENT HISTORY: partial hysterectomy, multiple pelvic fractures, hx cancer ? ?PRECAUTIONS: NA ? ?SUBJECTIVE: Pt states that she has been sick over the last.  ? ? ?SUBJECTIVE 02/12/22:                                                                                                                                                                                          ?  ?SUBJECTIVE STATEMENT: ?Patient states that she is having a lot of issues with bowel movements for years; she has been told that she needs to have surgery to repair. She states that when she feels fullness in rectum she will feel very nauseous and is having trouble eating. Pt states that after having shoulder surgery, she was diagnosed with multiple pelvic fractures with unknown cause - she thinks all fractures were on Rt side of pelvis. She is cleared for all activities now.  ?Fluid intake: Yes: 48oz of water   ?  ?Patient confirms identification and approves PT to assess pelvic floor and treatment Yes ?  ?PERTINENT HISTORY:  ?Multiple fractures of pubic ramus ?Sexual abuse: No ?  ?PAIN:  ?Are you having pain? No ?  ?  ?BOWEL MOVEMENT ?Pain with bowel movement: No - prior to bowel movement and then sometimes during - she feels like she has to lift up stomach to let bowel movement come out. ?Type of bowel movement:Frequency periods of having small one daily and then periods of going a week without one, Strain Yes, and Splinting sometimes, but more often on abdomen ?Fully empty rectum: No ?Leakage: No ?Pads: No ?Fiber supplement: No ?  ?URINATION ?Pain with urination: No ?Fully empty bladder: No ?Stream: Strong ?Urgency:  No ?Frequency: every 3 hours ?Leakage:  none ?Pads: No ?  ?INTERCOURSE ?Pain with intercourse:  none ?Ability to have vaginal penetration:  Yes: - ?Types of stimulation: - ?Climax: Yes: - ?Marinoff Scale  ?  ?PREGNANCY ?Vaginal deliveries 0 ?Tearing No ?C-section deliveries 5 ?Currently pregnant No ?  ?  ?PATIENT GOALS to have easier bowel movements ?  ?  ?OBJECTIVE 02/12/22:  ?  ?DIAGNOSTIC FINDINGS:  ?MRI performed 08/2021 to rule out cauda equina; defecogram demonstrated paradoxical puborectalis contraction/dyssenergia with bearing down; anal manometry demonstrated high tone and weak sphincters ?  ?COGNITION: ?           Overall cognitive status: Within functional limits for tasks assessed              ?  ?  ?POSTURE:  ?Posterior pelvic tilt, decrease hip extension/lumbar lordosis, forward head posture and rounded shoulder ?  ?PALPATION: ?Internal Pelvic Floor Vaginal exam: posterior levator ani tightness and discomfort Rt>Lt; Rectal exam: significant puborectalis and coccygeus tightness and discomfort bil ?  ?External Perineal Exam WNL ?  ?  ?  ?PELVIC MMT: ?  ?MMT   ?02/11/2022  ?Vaginal 2/5, pelvic floor endurance 5 seconds, 10 repetitions  ?Internal Anal Sphincter 1/5  ?External Anal Sphincter 2/5  ?Puborectalis 2/5  ?Diastasis Recti Not checked  ?(Blank rows = not tested) ?  ?  ?TONE: ?High ?  ?PROLAPSE: ?Unable to assess this treatment session due to dyssnergia of pelvic floor contraction when she is trying to bear down and push; attempted multimodal cues in time we had today to help improve this coordination, but patient unable to achieve; we discussed at length how this has likely contributed to rectocele ?  ?  ?TODAY'S TREATMENT 03/13/22: ?Manual: ?Soft tissue mobilization: ?Bil lumbar paraspinals  ?Lt posterolateral hip mm ?Scar tissue mobilization: ?Myofascial release: ?Spinal mobilization: ?Internal pelvic floor techniques: ?Internal rectal release of pelvic floor muscles  ?Coccygeal mobs and  traction ?Dry needling: ?Neuromuscular re-education: ?Core retraining:  ?Core facilitation: ?Form correction: ?Pelvic floor contraction training: ?Bulge/relaxation training with multimodal cues and diaphragmatic breathing ?[place]

## 2022-03-20 ENCOUNTER — Ambulatory Visit: Payer: Medicaid Other

## 2022-03-27 ENCOUNTER — Ambulatory Visit: Payer: Medicaid Other

## 2022-03-27 DIAGNOSIS — M6281 Muscle weakness (generalized): Secondary | ICD-10-CM | POA: Diagnosis not present

## 2022-03-27 DIAGNOSIS — G8929 Other chronic pain: Secondary | ICD-10-CM | POA: Diagnosis not present

## 2022-03-27 DIAGNOSIS — M25512 Pain in left shoulder: Secondary | ICD-10-CM | POA: Diagnosis not present

## 2022-03-27 DIAGNOSIS — R279 Unspecified lack of coordination: Secondary | ICD-10-CM

## 2022-03-27 DIAGNOSIS — M62838 Other muscle spasm: Secondary | ICD-10-CM

## 2022-03-27 DIAGNOSIS — G894 Chronic pain syndrome: Secondary | ICD-10-CM | POA: Diagnosis not present

## 2022-03-27 DIAGNOSIS — M961 Postlaminectomy syndrome, not elsewhere classified: Secondary | ICD-10-CM | POA: Diagnosis not present

## 2022-03-27 DIAGNOSIS — M5136 Other intervertebral disc degeneration, lumbar region: Secondary | ICD-10-CM | POA: Diagnosis not present

## 2022-03-27 DIAGNOSIS — M79622 Pain in left upper arm: Secondary | ICD-10-CM | POA: Diagnosis not present

## 2022-03-27 NOTE — Therapy (Signed)
?OUTPATIENT PHYSICAL THERAPY TREATMENT NOTE ? ? ?Patient Name: [name] [name] ?MRN: [id number] ?DOB:[birth year], 56 y.o., female ?Today's Date: 03/27/2022 ? ?PCP: Scarlett Presto, MD ?REFERRING PROVIDER: Velna Ochs, MD ? ? PT End of Session - 03/27/22 1618   ? ? Visit Number 4   ? Date for PT Re-Evaluation 05/07/22   ? Authorization Type [employer]   ? Authorization Time Period 02/12/22-04/13/22   ? Authorization - Visit Number 4   ? Authorization - Number of Visits 4   ? PT Start Time 1615   ? PT Stop Time 1655   ? PT Time Calculation (min) 40 min   ? Activity Tolerance Patient tolerated treatment well   ? Behavior During Therapy [facility] for tasks assessed/performed   ? ?  ?  ? ?  ? ? ? ?Past Medical History:  ?Diagnosis Date  ? Abdominal pain 06/25/2010  ? CT abd/pelvis 05/2014: Very large amount of stool noted distending the colon. The size consistent with severe constipation/fecal impaction.    ? Abnormal cervical Papanicolaou smear 04/17/2017  ? Anemia   ? Arthritis   ? lumbar region- radiculopathy  ? Cancer [facility]) 2013  ?  Thyroid Ca-thyroidectomy  ? Chronic fatigue   ? DM (diabetes mellitus) ([place])   ? Gait disturbance   ? GERD (gastroesophageal reflux disease)   ? Headache(784.0)   ? Herniated lumbar disc without myelopathy 08/20/2016  ? History of osteoporosis 05/25/2007  ? DEXA 2014 Since the baseline examination in 2002, there has been a  statistically significant 19.5% increase in bone mineral density in  the lumbar spine and a statistically significant 14.6% increase in  bone mineral density in the left femur.   DEXA 2002 with osteoporosis of lumbar spine and moderate osteopenia of left hip MRI 2002 left hip negative for avascular necrosis  Qualifier: Diagnosis o  ? HTN (hypertension)   ? Hypothyroidism   ? Insomnia 01/02/2015  ? Insomnia 01/02/2015  ? Intermittent left-sided chest pain 01/26/2013  ? Left sided numbness 12/10/2018  ? Nephrolithiasis   ? Obesity   ? OSA (obstructive sleep apnea)   ? last test-  many yrs. ago, done at [facility], no CPAP in use  ? Osteoporosis   ? Pain and swelling of lower leg, left 12/16/2017  ? Peripheral neuropathy   ? Prolonged QT interval   ? Rectocele 07/23/2020  ? [facility]  ? Sarcoidosis   ? 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 ?Past Surgical History:  ?Procedure Laterality Date  ? BRAIN SURGERY  2001  ? for sarcoidosis  ? BREAST BIOPSY Left   ? BREAST BIOPSY Right   ? BREAST EXCISIONAL BIOPSY Right   ? x2 benign  ? BREAST EXCISIONAL BIOPSY Left   ? benign  ? BREAST SURGERY Bilateral   ? biopsies   ? DILATION AND CURETTAGE OF UTERUS    ? HYSTEROSCOPY    ? LUMBAR LAMINECTOMY/DECOMPRESSION MICRODISCECTOMY Left 08/20/2016  ? Procedure: Left Lumbar four-five Microdiskectomy;  Surgeon: Leeroy Cha, MD;  Location: [place] [facility];  Service: Neurosurgery;  Laterality: Left;  ? PARTIAL HYSTERECTOMY  2013  ? ROTATOR CUFF REPAIR Right   ? 07/2013  ? Suboccipital craniotomy    ? THYROIDECTOMY    ? Precancerous lesion  ? ?Patient Active Problem List  ? Diagnosis Date Noted  ? Dyssynergic defecation 02/09/2022  ? Abdominal wall pain in flank 02/09/2022  ? Chronic migraine without aura without status migrainosus, not intractable 01/27/2022  ? Epistaxis 12/30/2021  ? Closed  fracture of multiple pubic rami ([place]) 06/13/2021  ? Osteoporosis 06/13/2021  ? Women's annual routine gynecological examination 05/13/2021  ? History of abdominal hysterectomy 05/13/2021  ? Rectocele 05/13/2021  ? Cough 05/16/2020  ? Neck mass 02/15/2020  ? 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 Chronic constipation 08/24/2019  ? Gluteal pain 05/24/2019  ? Chronic left shoulder pain 01/30/2019  ? Obesity 11/24/2016  ? Healthcare maintenance 02/18/2016  ? Common migraine with intractable migraine 02/09/2013  ? Hypothyroidism 05/08/2011  ? Hyperlipidemia 01/28/2010  ? Sciatica of left side associated with disorder of lumbar spine 10/20/2007  ? 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 Controlled diabetes mellitus type 2, ([place])  05/25/2007  ? OBSTRUCTIVE SLEEP APNEA 05/25/2007  ? Essential hypertension 05/25/2007  ? Gastroesophageal reflux disease 05/25/2007  ? LONG QT SYNDROME 05/25/2007  ? ? ?REFERRING DIAG: N81.6 (ICD-10-CM) - Rectocele ? ?THERAPY DIAG:  ?Other muscle spasm ? ?Muscle weakness (generalized) ? ?Unspecified lack of coordination ? ?PERTINENT HISTORY: partial hysterectomy, multiple pelvic fractures, hx cancer ? ?PRECAUTIONS: NA ? ?SUBJECTIVE: Pt reports seeing increased frequency of bowel movements after last treatment session. She continues to feel very bothered by vaginal bulge.  ? ? ?SUBJECTIVE 02/12/22:                                                                                                                                                                                          ?  ?SUBJECTIVE STATEMENT: ?Patient states that she is having a lot of issues with bowel movements for years; she has been told that she needs to have surgery to repair. She states that when she feels fullness in rectum she will feel very nauseous and is having trouble eating. Pt states that after having shoulder surgery, she was diagnosed with multiple pelvic fractures with unknown cause - she thinks all fractures were on Rt side of pelvis. She is cleared for all activities now.  ?Fluid intake: Yes: 48oz of water   ?  ?Patient confirms identification and approves PT to assess pelvic floor and treatment Yes ?  ?PERTINENT HISTORY:  ?Multiple fractures of pubic ramus ?Sexual abuse: No ?  ?PAIN:  ?Are you having pain? No ?  ?  ?BOWEL MOVEMENT ?Pain with bowel movement: No - prior to bowel movement and then sometimes during - she feels like she has to lift up stomach to let bowel movement come out. ?Type of bowel movement:Frequency periods of having small one daily and then periods of going a week without one, Strain Yes, and Splinting sometimes, but more often on abdomen ?Fully empty rectum: No ?Leakage: No ?Pads: No ?Fiber supplement: No ?   ?  URINATION ?Pain with urination: No ?Fully empty bladder: No ?Stream: Strong ?Urgency: No ?Frequency: every 3 hours ?Leakage:  none ?Pads: No ?  ?INTERCOURSE ?Pain with intercourse:  none ?Ability to have vaginal penetration:  Yes: - ?Types of stimulation: - ?Climax: Yes: - ?Marinoff Scale  ?  ?PREGNANCY ?Vaginal deliveries 0 ?Tearing No ?C-section deliveries 5 ?Currently pregnant No ?  ?  ?PATIENT GOALS to have easier bowel movements ?  ?  ?OBJECTIVE 02/12/22:  ?  ?DIAGNOSTIC FINDINGS:  ?MRI performed 08/2021 to rule out cauda equina; defecogram demonstrated paradoxical puborectalis contraction/dyssenergia with bearing down; anal manometry demonstrated high tone and weak sphincters ?  ?COGNITION: ?           Overall cognitive status: Within functional limits for tasks assessed              ?  ?  ?POSTURE:  ?Posterior pelvic tilt, decrease hip extension/lumbar lordosis, forward head posture and rounded shoulder ?  ?PALPATION: ?Internal Pelvic Floor Vaginal exam: posterior levator ani tightness and discomfort Rt>Lt; Rectal exam: significant puborectalis and coccygeus tightness and discomfort bil ?  ?External Perineal Exam WNL ?  ?  ?  ?PELVIC MMT: ?  ?MMT   ?02/11/2022  ?Vaginal 2/5, pelvic floor endurance 5 seconds, 10 repetitions  ?Internal Anal Sphincter 1/5  ?External Anal Sphincter 2/5  ?Puborectalis 2/5  ?Diastasis Recti Not checked  ?(Blank rows = not tested) ?  ?  ?TONE: ?High ?  ?PROLAPSE: ?Unable to assess this treatment session due to dyssnergia of pelvic floor contraction when she is trying to bear down and push; attempted multimodal cues in time we had today to help improve this coordination, but patient unable to achieve; we discussed at length how this has likely contributed to rectocele ?  ?  ?TODAY'S TREATMENT 03/27/22: ?Manual: ?Soft tissue mobilization: ?Bil lumbar paraspinals  ?Lt posterolateral hip mm ?Scar tissue mobilization: ?Myofascial release: ?Spinal mobilization: ?Internal pelvic floor  techniques: ?Internal rectal release of pelvic floor muscles  ?Coccygeal mobs and traction ?Manual training for pelvic floor bulge/relaxation  ?No emotional/communication barriers or cognitive limitation. Patient is

## 2022-03-28 ENCOUNTER — Other Ambulatory Visit: Payer: Medicaid Other

## 2022-03-31 DIAGNOSIS — Z419 Encounter for procedure for purposes other than remedying health state, unspecified: Secondary | ICD-10-CM | POA: Diagnosis not present

## 2022-04-03 ENCOUNTER — Ambulatory Visit: Payer: Medicaid Other | Attending: Internal Medicine

## 2022-04-03 DIAGNOSIS — R279 Unspecified lack of coordination: Secondary | ICD-10-CM | POA: Diagnosis not present

## 2022-04-03 DIAGNOSIS — M6281 Muscle weakness (generalized): Secondary | ICD-10-CM | POA: Diagnosis not present

## 2022-04-03 DIAGNOSIS — M62838 Other muscle spasm: Secondary | ICD-10-CM | POA: Insufficient documentation

## 2022-04-03 NOTE — Therapy (Signed)
?OUTPATIENT PHYSICAL THERAPY TREATMENT NOTE ? ? ?Patient Name: [name] [name] ?MRN: [id number] ?DOB:02/05/1966, 56 y.o., female ?Today's Date: 04/03/2022 ? ?PCP: Scarlett Presto, MD ?REFERRING PROVIDER: Scarlett Presto, MD ? ? PT End of Session - 04/03/22 1622   ? ? Visit Number 5   ? Date for PT Re-Evaluation 05/07/22   ? Authorization Type [facility]   ? Authorization Time Period waiting on auth   ? PT Start Time 5993   ? PT Stop Time 1701   ? PT Time Calculation (min) 40 min   ? Activity Tolerance Patient tolerated treatment well   ? Behavior During Therapy [facility] for tasks assessed/performed   ? ?  ?  ? ?  ? ? ? ? ?Past Medical History:  ?Diagnosis Date  ? Abdominal pain 06/25/2010  ? CT abd/pelvis 05/2014: Very large amount of stool noted distending the colon. The size consistent with severe constipation/fecal impaction.    ? Abnormal cervical Papanicolaou smear 04/17/2017  ? Anemia   ? Arthritis   ? lumbar region- radiculopathy  ? Cancer [facility]) 2013  ?  Thyroid Ca-thyroidectomy  ? Chronic fatigue   ? DM (diabetes mellitus) ([place])   ? Gait disturbance   ? GERD (gastroesophageal reflux disease)   ? Headache(784.0)   ? Herniated lumbar disc without myelopathy 08/20/2016  ? History of osteoporosis 05/25/2007  ? DEXA 2014 Since the baseline examination in 2002, there has been a  statistically significant 19.5% increase in bone mineral density in  the lumbar spine and a statistically significant 14.6% increase in  bone mineral density in the left femur.   DEXA 2002 with osteoporosis of lumbar spine and moderate osteopenia of left hip MRI 2002 left hip negative for avascular necrosis  Qualifier: Diagnosis o  ? HTN (hypertension)   ? Hypothyroidism   ? Insomnia 01/02/2015  ? Insomnia 01/02/2015  ? Intermittent left-sided chest pain 01/26/2013  ? Left sided numbness 12/10/2018  ? Nephrolithiasis   ? Obesity   ? OSA (obstructive sleep apnea)   ? last test- many yrs. ago, done at [facility], no CPAP in use  ? Osteoporosis   ? Pain and  swelling of lower leg, left 12/16/2017  ? Peripheral neuropathy   ? Prolonged QT interval   ? Rectocele 07/23/2020  ? [facility]  ? Sarcoidosis   ? 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 ?Past Surgical History:  ?Procedure Laterality Date  ? BRAIN SURGERY  2001  ? for sarcoidosis  ? BREAST BIOPSY Left   ? BREAST BIOPSY Right   ? BREAST EXCISIONAL BIOPSY Right   ? x2 benign  ? BREAST EXCISIONAL BIOPSY Left   ? benign  ? BREAST SURGERY Bilateral   ? biopsies   ? DILATION AND CURETTAGE OF UTERUS    ? HYSTEROSCOPY    ? LUMBAR LAMINECTOMY/DECOMPRESSION MICRODISCECTOMY Left 08/20/2016  ? Procedure: Left Lumbar four-five Microdiskectomy;  Surgeon: Leeroy Cha, MD;  Location: [place] [facility];  Service: Neurosurgery;  Laterality: Left;  ? PARTIAL HYSTERECTOMY  2013  ? ROTATOR CUFF REPAIR Right   ? 07/2013  ? Suboccipital craniotomy    ? THYROIDECTOMY    ? Precancerous lesion  ? ?Patient Active Problem List  ? Diagnosis Date Noted  ? Dyssynergic defecation 02/09/2022  ? Abdominal wall pain in flank 02/09/2022  ? Chronic migraine without aura without status migrainosus, not intractable 01/27/2022  ? Epistaxis 12/30/2021  ? Closed fracture of multiple pubic rami ([place]) 06/13/2021  ? Osteoporosis 06/13/2021  ? Women's  annual routine gynecological examination 05/13/2021  ? History of abdominal hysterectomy 05/13/2021  ? Rectocele 05/13/2021  ? Cough 05/16/2020  ? Neck mass 02/15/2020  ? 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 Chronic constipation 08/24/2019  ? Gluteal pain 05/24/2019  ? Chronic left shoulder pain 01/30/2019  ? Obesity 11/24/2016  ? Healthcare maintenance 02/18/2016  ? Common migraine with intractable migraine 02/09/2013  ? Hypothyroidism 05/08/2011  ? Hyperlipidemia 01/28/2010  ? Sciatica of left side associated with disorder of lumbar spine 10/20/2007  ? 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 Controlled diabetes mellitus type 2, ([place]) 05/25/2007  ? OBSTRUCTIVE SLEEP APNEA 05/25/2007  ? Essential hypertension  05/25/2007  ? Gastroesophageal reflux disease 05/25/2007  ? LONG QT SYNDROME 05/25/2007  ? ? ?REFERRING DIAG: N81.6 (ICD-10-CM) - Rectocele ? ?THERAPY DIAG:  ?Other muscle spasm ? ?Muscle weakness (generalized) ? ?Unspecified lack of coordination ? ?PERTINENT HISTORY: partial hysterectomy, multiple pelvic fractures, hx cancer ? ?PRECAUTIONS: NA ? ?SUBJECTIVE: Pt states that she tried all of the poise impressa and they made her feel very crampy. She still feels abdominal pain prior to bowel movements. She has not noticed increased frequency of bowel movements over the last week, but she does feel like when she does have bowel movement she knows things that are helpful. She feels like hip/LB pain was a little less painful over the last week.  ? ? ?SUBJECTIVE 02/12/22:                                                                                                                                                                                          ?  ?SUBJECTIVE STATEMENT: ?Patient states that she is having a lot of issues with bowel movements for years; she has been told that she needs to have surgery to repair. She states that when she feels fullness in rectum she will feel very nauseous and is having trouble eating. Pt states that after having shoulder surgery, she was diagnosed with multiple pelvic fractures with unknown cause - she thinks all fractures were on Rt side of pelvis. She is cleared for all activities now.  ?Fluid intake: Yes: 48oz of water   ?  ?Patient confirms identification and approves PT to assess pelvic floor and treatment Yes ?  ?PERTINENT HISTORY:  ?Multiple fractures of pubic ramus ?Sexual abuse: No ?  ?PAIN:  ?Are you having pain? No ?  ?  ?BOWEL MOVEMENT ?Pain with bowel movement: No - prior to bowel movement and then sometimes during - she feels like she has to lift up stomach to let bowel movement come out. ?Type of bowel movement:Frequency periods of having small  one daily and then  periods of going a week without one, Strain Yes, and Splinting sometimes, but more often on abdomen ?Fully empty rectum: No ?Leakage: No ?Pads: No ?Fiber supplement: No ?  ?URINATION ?Pain with urination: No ?Fully empty bladder: No ?Stream: Strong ?Urgency: No ?Frequency: every 3 hours ?Leakage:  none ?Pads: No ?  ?INTERCOURSE ?Pain with intercourse:  none ?Ability to have vaginal penetration:  Yes: - ?Types of stimulation: - ?Climax: Yes: - ?Marinoff Scale  ?  ?PREGNANCY ?Vaginal deliveries 0 ?Tearing No ?C-section deliveries 5 ?Currently pregnant No ?  ?  ?PATIENT GOALS to have easier bowel movements ?  ?  ?OBJECTIVE 02/12/22:  ?  ?DIAGNOSTIC FINDINGS:  ?MRI performed 08/2021 to rule out cauda equina; defecogram demonstrated paradoxical puborectalis contraction/dyssenergia with bearing down; anal manometry demonstrated high tone and weak sphincters ?  ?COGNITION: ?           Overall cognitive status: Within functional limits for tasks assessed              ?  ?  ?POSTURE:  ?Posterior pelvic tilt, decrease hip extension/lumbar lordosis, forward head posture and rounded shoulder ?  ?PALPATION: ?Internal Pelvic Floor Vaginal exam: posterior levator ani tightness and discomfort Rt>Lt; Rectal exam: significant puborectalis and coccygeus tightness and discomfort bil ?  ?External Perineal Exam WNL ?  ?  ?  ?PELVIC MMT: ?  ?MMT   ?02/11/2022  ?Vaginal 2/5, pelvic floor endurance 5 seconds, 10 repetitions  ?Internal Anal Sphincter 1/5  ?External Anal Sphincter 2/5  ?Puborectalis 2/5  ?Diastasis Recti Not checked  ?(Blank rows = not tested) ?  ?  ?TONE: ?High ?  ?PROLAPSE: ?Unable to assess this treatment session due to dyssnergia of pelvic floor contraction when she is trying to bear down and push; attempted multimodal cues in time we had today to help improve this coordination, but patient unable to achieve; we discussed at length how this has likely contributed to rectocele ?  ?  ?TODAY'S TREATMENT 04/03/22: ?Manual: ?Soft  tissue mobilization: ?Bil lumbar paraspinals and glutes/deep hip rotators ?Scar tissue mobilization: ?Myofascial release: ?Spinal mobilization: ?Internal pelvic floor techniques: ?Dry needling: ?Neuromuscular re

## 2022-04-04 ENCOUNTER — Other Ambulatory Visit: Payer: Self-pay | Admitting: Cardiovascular Disease

## 2022-04-04 DIAGNOSIS — I420 Dilated cardiomyopathy: Secondary | ICD-10-CM

## 2022-04-10 ENCOUNTER — Ambulatory Visit: Payer: Medicaid Other

## 2022-04-10 DIAGNOSIS — R279 Unspecified lack of coordination: Secondary | ICD-10-CM

## 2022-04-10 DIAGNOSIS — M62838 Other muscle spasm: Secondary | ICD-10-CM | POA: Diagnosis not present

## 2022-04-10 DIAGNOSIS — M6281 Muscle weakness (generalized): Secondary | ICD-10-CM | POA: Diagnosis not present

## 2022-04-10 NOTE — Therapy (Addendum)
OUTPATIENT PHYSICAL THERAPY TREATMENT NOTE   Patient Name: [name] [name] MRN: [id number] DOB:[birth year], 56 y.o., female Today's Date: 04/10/2022  PCP: Scarlett Presto, MD REFERRING PROVIDER: Velna Ochs, MD   PT End of Session - 04/10/22 1613     Visit Number 6    Date for PT Re-Evaluation 05/07/22    Authorization Type [employer]    Authorization Time Period waiting on auth    Activity Tolerance Patient tolerated treatment well    Behavior During Therapy [facility] for tasks assessed/performed               Past Medical History:  Diagnosis Date   Abdominal pain 06/25/2010   CT abd/pelvis 05/2014: Very large amount of stool noted distending the colon. The size consistent with severe constipation/fecal impaction.     Abnormal cervical Papanicolaou smear 04/17/2017   Anemia    Arthritis    lumbar region- radiculopathy   Cancer ([place]) 2013    Thyroid Ca-thyroidectomy   Chronic fatigue    DM (diabetes mellitus) (HCC)    Gait disturbance    GERD (gastroesophageal reflux disease)    Headache(784.0)    Herniated lumbar disc without myelopathy 08/20/2016   History of osteoporosis 05/25/2007   DEXA 2014 Since the baseline examination in 2002, there has been a  statistically significant 19.5% increase in bone mineral density in  the lumbar spine and a statistically significant 14.6% increase in  bone mineral density in the left femur.   DEXA 2002 with osteoporosis of lumbar spine and moderate osteopenia of left hip MRI 2002 left hip negative for avascular necrosis  Qualifier: Diagnosis o   HTN (hypertension)    Hypothyroidism    Insomnia 01/02/2015   Insomnia 01/02/2015   Intermittent left-sided chest pain 01/26/2013   Left sided numbness 12/10/2018   Nephrolithiasis    Obesity    OSA (obstructive sleep apnea)    last test- many yrs. 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, treated with surgery   Past Surgical History:  Procedure Laterality Date   BRAIN SURGERY  2001   for sarcoidosis   BREAST BIOPSY Left    BREAST BIOPSY Right    BREAST EXCISIONAL BIOPSY Right    x2 benign   BREAST EXCISIONAL BIOPSY Left    benign   BREAST SURGERY Bilateral    biopsies    DILATION AND CURETTAGE OF UTERUS     HYSTEROSCOPY     LUMBAR LAMINECTOMY/DECOMPRESSION MICRODISCECTOMY Left 08/20/2016   Procedure: Left Lumbar four-five Microdiskectomy;  Surgeon: Leeroy Cha, MD;  Location: [facility];  Service: Neurosurgery;  Laterality: Left;   PARTIAL HYSTERECTOMY  2013   ROTATOR CUFF REPAIR Right    07/2013   Suboccipital craniotomy     THYROIDECTOMY     Precancerous lesion   Patient Active Problem List   Diagnosis Date Noted   Dyssynergic defecation 02/09/2022   Abdominal wall pain in flank 02/09/2022   Chronic migraine without aura without status migrainosus, not intractable 01/27/2022   Epistaxis 12/30/2021   Closed fracture of multiple pubic rami ([place]) 06/13/2021   Osteoporosis 06/13/2021   Women's annual routine gynecological examination 05/13/2021   History of abdominal hysterectomy 05/13/2021   Rectocele 05/13/2021   Cough 05/16/2020   Neck  mass 02/15/2020   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 08/24/2019   Chronic constipation 08/24/2019   Gluteal pain 05/24/2019   Chronic left shoulder pain 01/30/2019   Obesity 11/24/2016   Healthcare maintenance 02/18/2016   Common migraine with intractable migraine 02/09/2013   Hypothyroidism 05/08/2011   Hyperlipidemia 01/28/2010   Sciatica of left side associated with disorder of lumbar spine 10/20/2007   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 She did have pain in Rt flank again, which typically happens when she needs to have bowel movement - it has been a while since she has had that pain. She feels like new exercises are helpful and she enjoys them. She did have a rough week with having bowel movements and feels like it may be due to not having the internal rectal release techniques last week.    SUBJECTIVE 02/12/22:                                                                                                                                                                                            SUBJECTIVE STATEMENT: Patient states that she is having a lot of issues with bowel movements for years; she has been told that she needs to have surgery to repair. She states that when she feels fullness in rectum she will feel very nauseous and is having trouble eating. Pt states that after having shoulder surgery, she was diagnosed with multiple pelvic fractures with unknown cause - she thinks all fractures were on Rt side of pelvis. She is cleared for all activities now.  Fluid intake: Yes: 48oz of water     Patient confirms identification and approves PT to assess pelvic floor and treatment Yes   PERTINENT HISTORY:  Multiple fractures of pubic ramus Sexual abuse: No   PAIN:  Are you having pain? No     BOWEL MOVEMENT Pain with bowel movement: No - prior to bowel movement and then sometimes during - she feels like she has to lift up stomach to let bowel movement come out. Type of bowel movement:Frequency periods of having small one daily and then periods of going a week without one, Strain Yes, and  Splinting sometimes, but more often on abdomen Fully empty rectum: No Leakage: No Pads: No Fiber supplement: No   URINATION Pain with urination: No Fully empty bladder: No Stream: Strong Urgency: No Frequency: every 3 hours Leakage:  none Pads: No   INTERCOURSE Pain with intercourse:  none Ability to have vaginal penetration:  Yes: - Types of stimulation: - Climax: Yes: - Marinoff Scale    PREGNANCY Vaginal deliveries 0 Tearing No C-section deliveries 5 Currently pregnant No     PATIENT GOALS to have easier bowel movements     OBJECTIVE 02/12/22:    DIAGNOSTIC FINDINGS:  MRI performed 08/2021 to rule out cauda equina; defecogram demonstrated paradoxical puborectalis contraction/dyssenergia with bearing down; anal manometry demonstrated high tone and weak sphincters   COGNITION:            Overall cognitive status: Within functional limits for tasks assessed                  POSTURE:  Posterior pelvic tilt, decrease hip extension/lumbar lordosis, forward head posture and rounded shoulder   PALPATION: Internal Pelvic Floor Vaginal exam: posterior levator ani tightness and discomfort Rt>Lt; Rectal exam: significant puborectalis and coccygeus tightness and discomfort bil   External Perineal Exam WNL       PELVIC MMT:   MMT   02/11/2022  Vaginal 2/5, pelvic floor endurance 5 seconds, 10 repetitions  Internal Anal Sphincter 1/5  External Anal Sphincter 2/5  Puborectalis 2/5  Diastasis Recti Not checked  (Blank rows = not tested)     TONE: High   PROLAPSE: Unable to assess this treatment session due to dyssnergia of pelvic floor contraction when she is trying to bear down and push; attempted multimodal cues in time we had today to help improve this coordination, but patient unable to achieve; we discussed at length how this has likely contributed to rectocele     TODAY'S TREATMENT 04/10/22 Manual: Soft tissue mobilization: Lt glute Rt flank, obliques,  iliac crest muscle attachments Scar tissue mobilization: Myofascial release: Spinal mobilization: Internal pelvic floor techniques: Coccyx mobilizations/traction Puborectalis/posterior levator ani release via rectum bil Dry needling: One trial needle to Lt glute med with consent provided by patient Neuromuscular re-education: Core retraining:  Core facilitation: Form correction: Pelvic floor contraction training: Down training: Exercises: Stretches/mobility: Strengthening: Therapeutic activities: Functional strengthening activities: Self-care:    TREATMENT 04/03/22: Manual: Soft tissue mobilization: Bil lumbar paraspinals and glutes/deep hip rotators Scar tissue mobilization: Myofascial release: Spinal mobilization: Internal pelvic floor techniques: Dry needling: Neuromuscular re-education: Core retraining:  Core facilitation: Form correction: Pelvic floor contraction training: Down training: Exercises: Stretches/mobility: Lower trunk rotation 3 x 10 Single knee to chest 5x bil Bent knee fall out 5 x bil Strengthening: Bridge 2 x 10 Clam shells 10x Therapeutic activities: Functional strengthening activities: Self-care:    TREATMENT 03/27/22: Manual: Soft tissue mobilization: Bil lumbar paraspinals  Lt posterolateral hip mm Scar tissue mobilization: Myofascial release: Spinal mobilization: Internal pelvic floor techniques: Internal rectal release of pelvic floor muscles  Coccygeal mobs and traction Manual training for pelvic floor bulge/relaxation  No emotional/communication barriers or cognitive limitation. Patient is motivated to learn. Patient understands and agrees with treatment goals and plan. PT explains patient will be examined in standing, sitting, and lying down to see how their muscles and joints work. When they are ready, they will be asked to remove their underwear so PT can examine their perineum. The patient is also given the option of  providing their own chaperone as  one is not provided in our facility. The patient also has the right and is explained the right to defer or refuse any part of the evaluation or treatment including the internal exam. With the patient's consent, PT will use one gloved finger to gently assess the muscles of the pelvic floor, seeing how well it contracts and relaxes and if there is muscle symmetry. After, the patient will get dressed and PT and patient will discuss exam findings and plan of care. PT and patient discuss plan of care, schedule, attendance policy and HEP activities.  Dry needling: Neuromuscular re-education: Core retraining:  Core facilitation: Form correction: Pelvic floor contraction training: Down training: Exercises: Stretches/mobility: Strengthening: Therapeutic activities: Functional strengthening activities: Self-care: Poise impressa - sizing kit Pessary       PATIENT EDUCATION:  Education details: HEP progressions Person educated: Patient Education method: Consulting civil engineer, Demonstration, Tactile cues, Verbal cues, and Handouts Education comprehension: verbalized understanding     HOME EXERCISE PROGRAM: [id number]   ASSESSMENT:   CLINICAL IMPRESSION: Return to internal manual techniques to pelvic floor and coccyx in order to decrease tension/restriction; since these techniques were not performed last treatment session and she had a more difficult week with bowel movements, it will be a good test to see if these techniques are what is helping to increase ease/frequency of bowel movements. We also performed manual techniques to Rt flank to see if it would decrease any restriction that is causing difficulty with bowel movements and increasing pain when she is unable to evacuate. She was willing to trial DN to Lt glutes to see if she could tolerate it; she did very well and had significant twitch response. She will continue to benefit from skilled PT intervention in order  to address impairments, improve ability to have regular and complete bowel movements, and improve QOL.      OBJECTIVE IMPAIRMENTS decreased activity tolerance, decreased coordination, decreased endurance, decreased mobility, decreased ROM, decreased strength, hypomobility, increased fascial restrictions, increased muscle spasms, impaired tone, postural dysfunction, and pain.    ACTIVITY LIMITATIONS  bowel movements .    PERSONAL FACTORS 1-2 comorbidities: partial hysterectomy 2013; multiple right pelvic fractures October 2022  are also affecting patient's functional outcome.      REHAB POTENTIAL: Good   CLINICAL DECISION MAKING: Stable/uncomplicated   EVALUATION COMPLEXITY: Low     GOALS: Goals reviewed with patient? Yes   SHORT TERM GOALS: Target date: 03/11/2022   Pt will be independent with HEP.  Baseline:  Goal status: MET   2.  Pt will be independent with use of squatty potty, relaxed toileting mechanics, and improved bowel movement techniques in order to increase ease of bowel movements and complete evacuation.    Baseline:  Goal status: IN PROGRESS   3.  Pt will be able to correctly perform diaphragmatic breathing and appropriate pressure management in order to prevent worsening vaginal wall laxity and improve pelvic floor A/ROM.    Baseline:  Goal status: IN PROGRESS     LONG TERM GOALS: Target date: 05/06/2022   Pt will be independent with advanced HEP.  Baseline:  Goal status: IN PROGRESS   2.  Pt will demonstrate normal pelvic floor muscle tone and A/ROM, able to achieve 4/5 strength with contractions and 10 sec endurance, in order to provide appropriate lumbopelvic support in functional activities.    Baseline:  Goal status: IN PROGRESS   3.  Pt will report increased to 5 bowel movements a week with no straining and appropriate voiding mechanics.  Baseline:  Goal status: IN PROGRESS   4.  Pt will report no lower abdominal pain or nausea greater than 2/10.   Baseline:  Goal status: IN PROGRESS     PLAN: PT FREQUENCY: 1x/week   PT DURATION: 12 weeks   PLANNED INTERVENTIONS: Therapeutic exercises, Therapeutic activity, Neuromuscular re-education, Balance training, Gait training, Patient/Family education, Joint mobilization, Dry Needling, and Manual therapy   PLAN FOR NEXT SESSION: Continue DN to Lt glutes if tolerated well; continue manual techniques to coccyx/pelvic floor; progress gentle strengthening.     Heather Roberts, PT, DPT05/11/235:16 PM    PHYSICAL THERAPY DISCHARGE SUMMARY  Visits from Start of Care: 6  Current functional level related to goals / functional outcomes: Incomplete   Remaining deficits: See above   Education / Equipment: HEP   Patient agrees to discharge. Patient goals were partially met. Patient is being discharged due to not returning since the last visit.  Heather Roberts, PT, DPT12/26/239:34 AM

## 2022-04-12 ENCOUNTER — Other Ambulatory Visit: Payer: Self-pay | Admitting: Student

## 2022-04-24 DIAGNOSIS — G8929 Other chronic pain: Secondary | ICD-10-CM | POA: Diagnosis not present

## 2022-04-24 DIAGNOSIS — G894 Chronic pain syndrome: Secondary | ICD-10-CM | POA: Diagnosis not present

## 2022-04-24 DIAGNOSIS — M79662 Pain in left lower leg: Secondary | ICD-10-CM | POA: Diagnosis not present

## 2022-04-24 DIAGNOSIS — M961 Postlaminectomy syndrome, not elsewhere classified: Secondary | ICD-10-CM | POA: Diagnosis not present

## 2022-04-24 DIAGNOSIS — M5136 Other intervertebral disc degeneration, lumbar region: Secondary | ICD-10-CM | POA: Diagnosis not present

## 2022-04-24 DIAGNOSIS — M25512 Pain in left shoulder: Secondary | ICD-10-CM | POA: Diagnosis not present

## 2022-04-25 IMAGING — CR DG LUMBAR SPINE COMPLETE 4+V
5 series · 5 of 5 positions shown · non-contrast
Comparison: Lumbar spine 06/30/2019.  MRI 07/13/2017.

CLINICAL DATA: Lumbago.

EXAM:
LUMBAR SPINE - COMPLETE 4+ VIEW

[w lumbar spine ap]
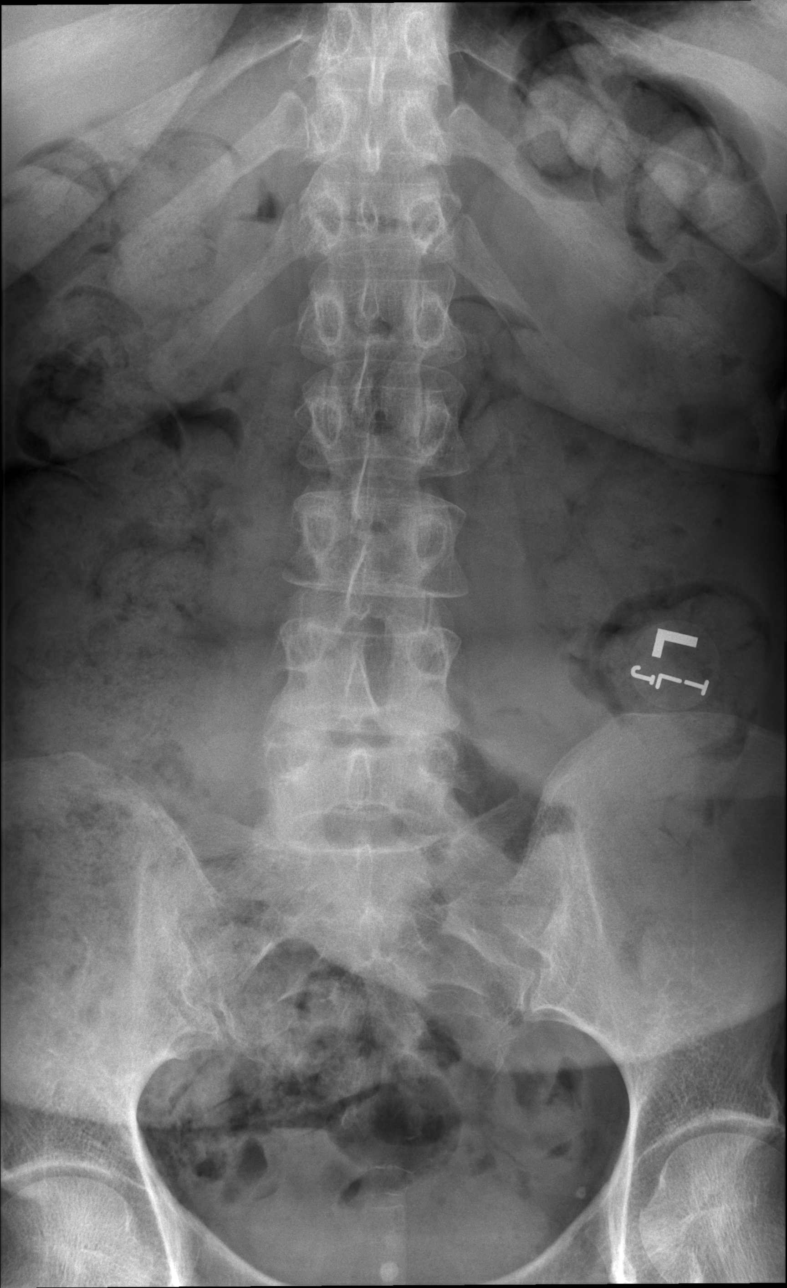

[w lumbar spine obl (1 of 2)]
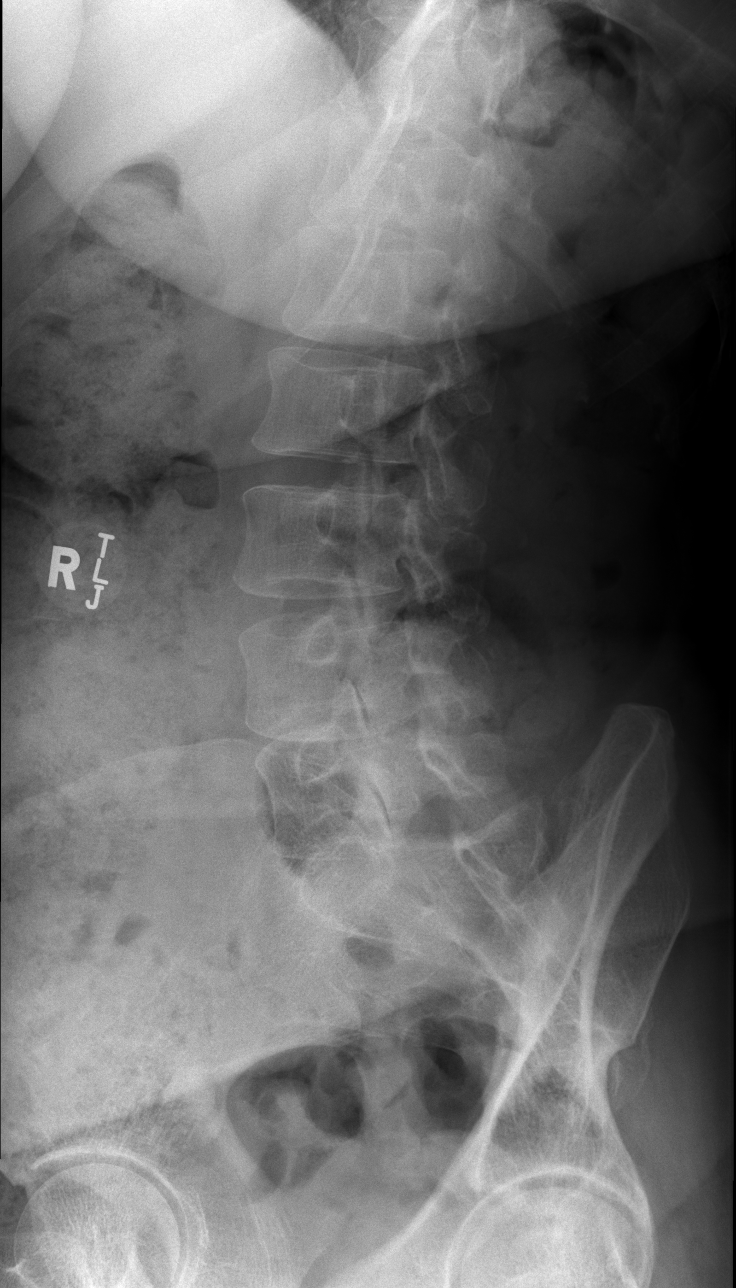

[w lumbar spine obl (2 of 2)]
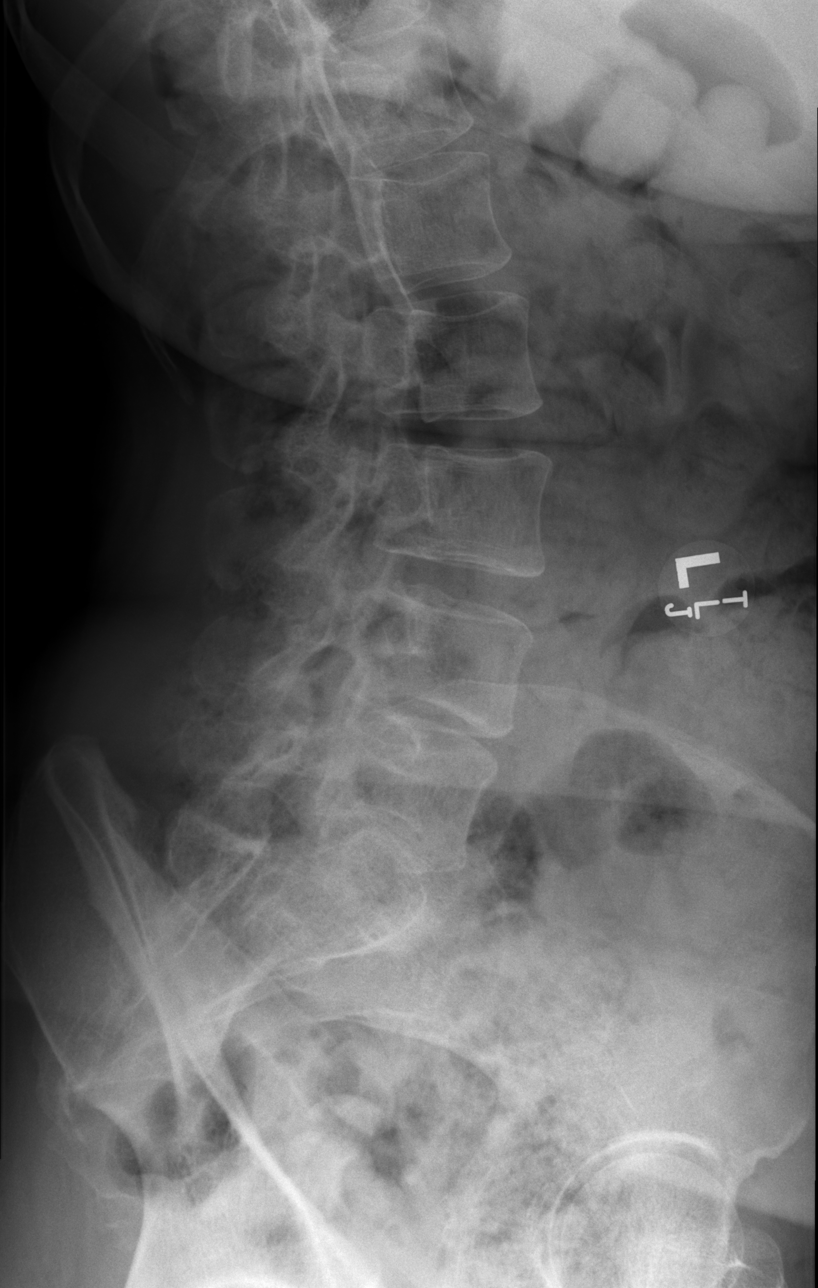

[w lumbar spine lat]
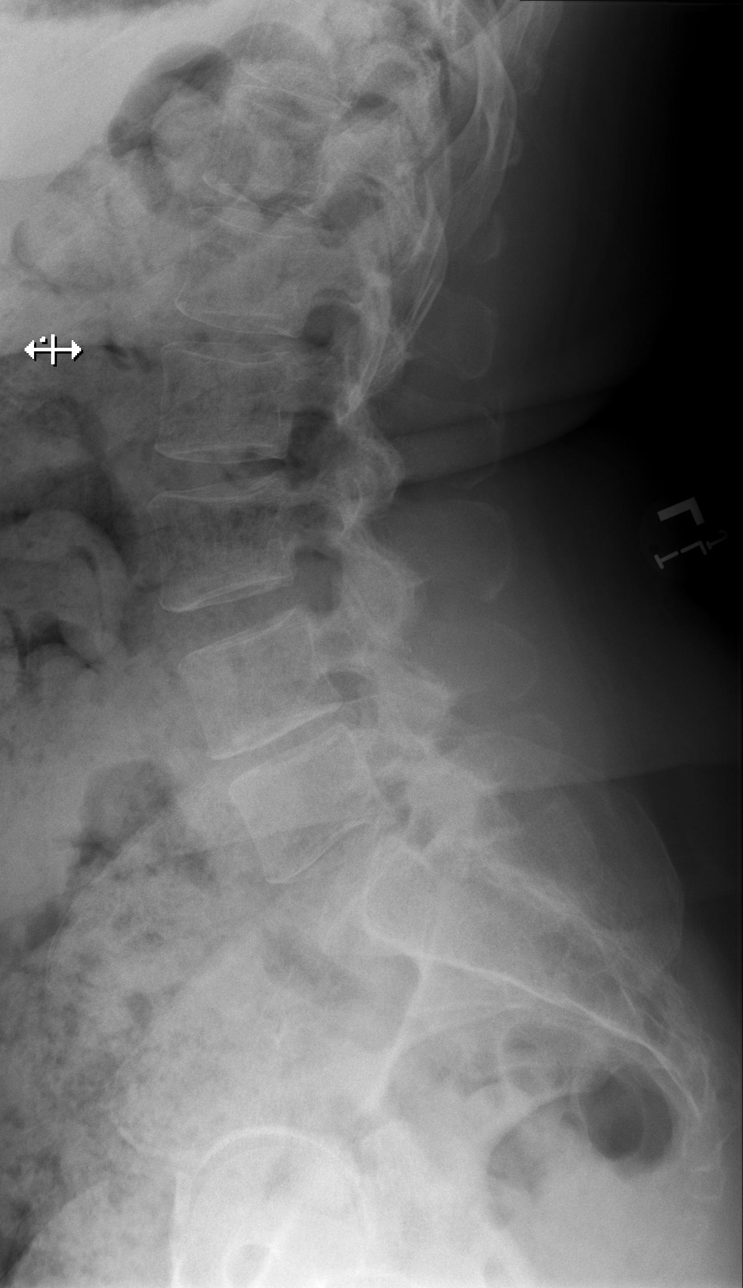

[w lumbar l-5 s-1 spot]
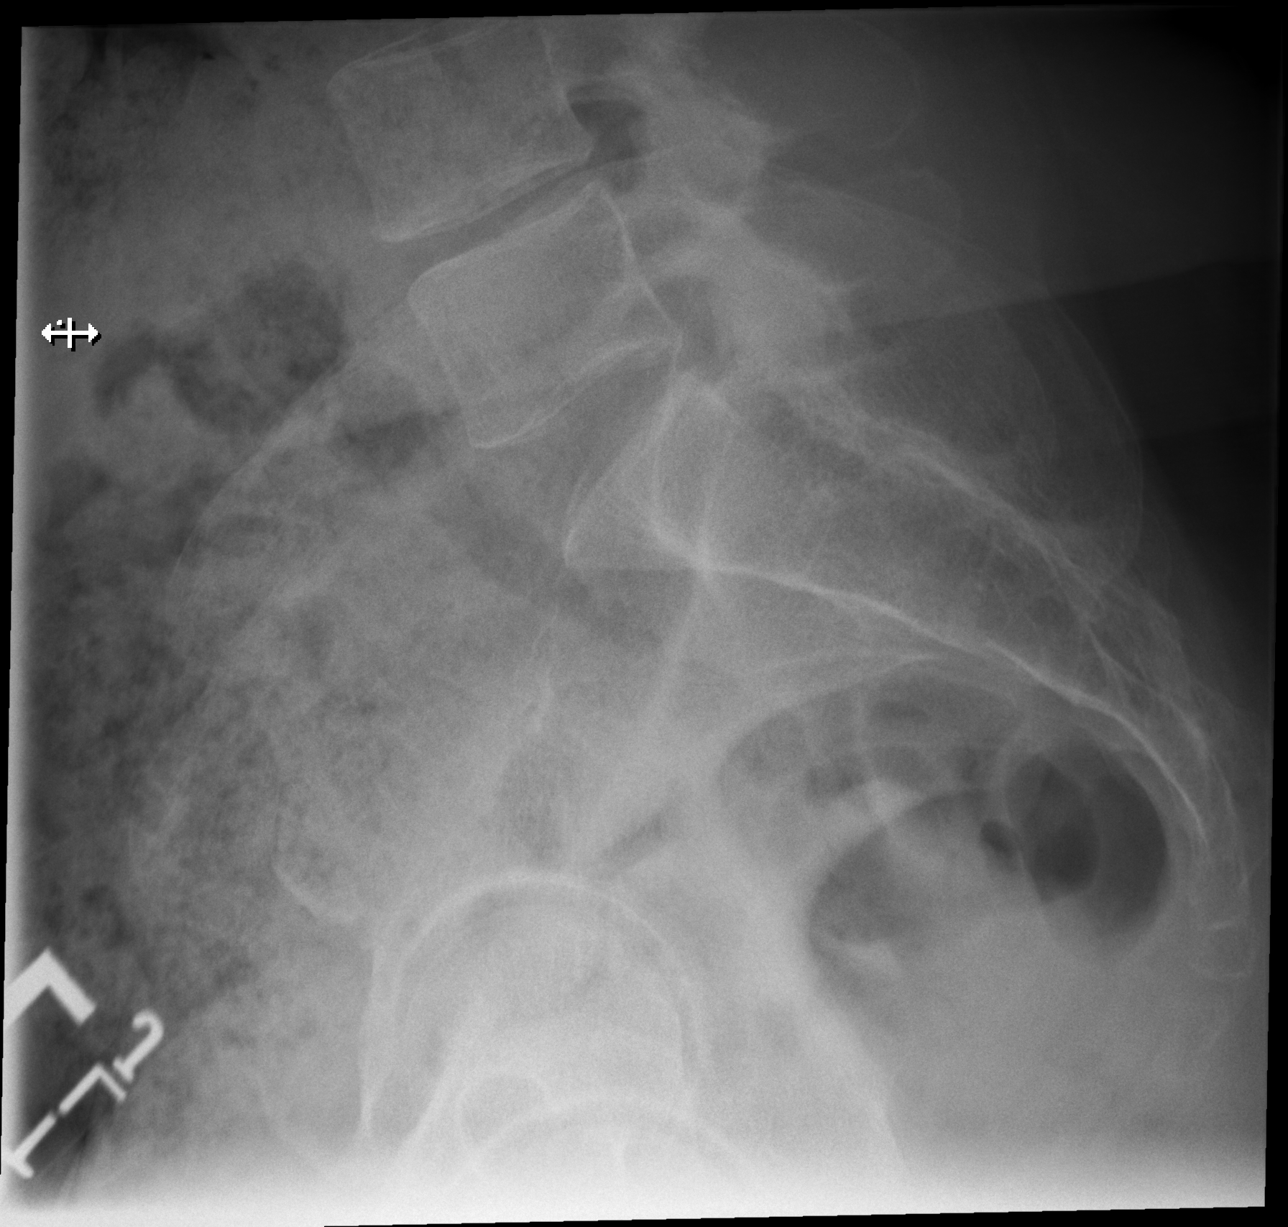

[5 of 5 positions shown; findings below may reference images not displayed]

FINDINGS: Lumbar spine numbered the lowest segmented appearing lumbar shaped
vertebrae on lateral view as L5. Mild L4-L5 disc degeneration and
endplate sclerosis consistent degenerative change again noted. No
acute bony abnormality identified. No evidence of fracture. Pelvic
calcifications consistent phleboliths.
IMPRESSION: Mild L4-L5 disc degeneration and endplate sclerosis again noted. No
acute abnormality identified.

## 2022-05-01 ENCOUNTER — Ambulatory Visit: Payer: Medicaid Other

## 2022-05-01 DIAGNOSIS — Z419 Encounter for procedure for purposes other than remedying health state, unspecified: Secondary | ICD-10-CM | POA: Diagnosis not present

## 2022-05-02 ENCOUNTER — Ambulatory Visit (INDEPENDENT_AMBULATORY_CARE_PROVIDER_SITE_OTHER): Payer: Medicaid Other | Admitting: Student

## 2022-05-02 ENCOUNTER — Other Ambulatory Visit: Payer: Self-pay | Admitting: Student

## 2022-05-02 VITALS — BP 129/68 | HR 84 | Temp 98.7°F | Wt 202.9 lb

## 2022-05-02 DIAGNOSIS — E890 Postprocedural hypothyroidism: Secondary | ICD-10-CM

## 2022-05-02 DIAGNOSIS — Z7984 Long term (current) use of oral hypoglycemic drugs: Secondary | ICD-10-CM | POA: Diagnosis not present

## 2022-05-02 DIAGNOSIS — R1084 Generalized abdominal pain: Secondary | ICD-10-CM | POA: Diagnosis not present

## 2022-05-02 DIAGNOSIS — E119 Type 2 diabetes mellitus without complications: Secondary | ICD-10-CM

## 2022-05-02 MED ORDER — TRULICITY 0.75 MG/0.5ML ~~LOC~~ SOAJ: 1.5000 mg | SUBCUTANEOUS | 3 refills | Status: DC

## 2022-05-02 NOTE — Patient Instructions (Addendum)
We will get labs and an x ray of your abdomen. The exact cause of your pain is not clear. We will get some labs and run some tests.   Please use an enema or suppository every other day for THREE times.

## 2022-05-03 LAB — URINALYSIS, ROUTINE W REFLEX MICROSCOPIC
Bilirubin, UA: NEGATIVE
Ketones, UA: NEGATIVE
Leukocytes,UA: NEGATIVE
Nitrite, UA: NEGATIVE
Protein,UA: NEGATIVE
RBC, UA: NEGATIVE
Specific Gravity, UA: 1.013 (ref 1.005–1.030)
Urobilinogen, Ur: 0.2 mg/dL (ref 0.2–1.0)
pH, UA: 6.5 (ref 5.0–7.5)

## 2022-05-03 LAB — CMP14 + ANION GAP
ALT: 20 IU/L (ref 0–32)
AST: 17 IU/L (ref 0–40)
Albumin/Globulin Ratio: 1.6 (ref 1.2–2.2)
Albumin: 4.3 g/dL (ref 3.8–4.9)
Alkaline Phosphatase: 81 IU/L (ref 44–121)
Anion Gap: 17 mmol/L (ref 10.0–18.0)
BUN/Creatinine Ratio: 25 — ABNORMAL HIGH (ref 9–23)
BUN: 19 mg/dL (ref 6–24)
Bilirubin Total: <0.2 mg/dL (ref 0.0–1.2)
CO2: 24 mmol/L (ref 20–29)
Calcium: 9.5 mg/dL (ref 8.7–10.2)
Chloride: 101 mmol/L (ref 96–106)
Creatinine, Ser: 0.76 mg/dL (ref 0.57–1.00)
Globulin, Total: 2.7 g/dL (ref 1.5–4.5)
Glucose: 134 mg/dL — ABNORMAL HIGH (ref 70–99)
Potassium: 5 mmol/L (ref 3.5–5.2)
Sodium: 142 mmol/L (ref 134–144)
Total Protein: 7 g/dL (ref 6.0–8.5)
eGFR: 93 mL/min/{1.73_m2} (ref >59)

## 2022-05-03 LAB — TSH: TSH: 1.09 u[IU]/mL (ref 0.450–4.500)

## 2022-05-04 NOTE — Assessment & Plan Note (Signed)
TSH WNL this clinic visit after reduction in synthroid. Continue with current dose.   Component Ref Range & Units 2 d ago (05/02/22) 3 mo ago (01/27/22) 1 yr ago (03/21/21) 1 yr ago (08/29/20) 2 yr ago (02/15/20) 3 yr ago (01/28/19) 4 yr ago (10/16/17)  TSH 0.450 - 4.500 uIU/mL 1.090  0.106Low

## 2022-05-04 NOTE — Progress Notes (Signed)
   CC: abdominal pain  HPI:  [name] is a 56 y.o. F with PMH per below who presents for abdominal pain of 2 weeks duration in the RUQ and LUQ. Please see problem based charting under encounters tab for further details.    Past Medical History:  Diagnosis Date   Abdominal pain 06/25/2010   CT abd/pelvis 05/2014: Very large amount of stool noted distending the colon. The size consistent with severe constipation/fecal impaction.     Abnormal cervical Papanicolaou smear 04/17/2017   Anemia    Arthritis    lumbar region- radiculopathy   Cancer ([place]) 2013    Thyroid Ca-thyroidectomy   Chronic fatigue    DM (diabetes mellitus) (HCC)    Gait disturbance    GERD (gastroesophageal reflux disease)    Headache(784.0)    Herniated lumbar disc without myelopathy 08/20/2016   History of osteoporosis 05/25/2007   DEXA 2014 Since the baseline examination in 2002, there has been a  statistically significant 19.5% increase in bone mineral density in  the lumbar spine and a statistically significant 14.6% increase in  bone mineral density in the left femur.   DEXA 2002 with osteoporosis of lumbar spine and moderate osteopenia of left hip MRI 2002 left hip negative for avascular necrosis  Qualifier: Diagnosis o   HTN (hypertension)    Hypothyroidism    Insomnia 01/02/2015   Insomnia 01/02/2015   Intermittent left-sided chest pain 01/26/2013   Left sided numbness 12/10/2018   Nephrolithiasis    Obesity    OSA (obstructive sleep apnea)    last test- many yrs. 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    Physical Exam:  Vitals:   05/02/22 1037  BP: 129/68  Pulse: 84  Temp: 98.7 F (37.1  C)  TempSrc: Oral  SpO2: 99%  Weight: 202 lb 14.4 oz (92 kg)    Constitutional: Well-developed, well-nourished, and in no distress.  HENT:  Head: Normocephalic and atraumatic.  Eyes: EOM are normal.  Neck: Normal range of motion.  Cardiovascular: Normal rate, regular rhythm, intact distal pulses. No gallop and no friction rub.  No murmur heard. No lower extremity edema  Pulmonary: Non labored breathing on room air, no wheezing or rales  Abdominal: Soft. Normal bowel sounds. Non distended. Very Mild tenderness to deep palpation in the RUQ and LUQ Musculoskeletal: Normal range of motion.        General: No tenderness or edema.  Neurological: Alert and oriented to person, place, and time. Non focal  Skin: Skin is warm and dry.    Assessment & Plan:   See Encounters Tab for problem based charting.  Patient discussed with Dr.  Saverio Danker

## 2022-05-04 NOTE — Assessment & Plan Note (Signed)
Patient was recently seen in our clinic for abdominal pain, that was felt to be due to abdominal wall pain. Patient states that her current pain began 2 weeks ago. She does not recall any precipitating factors. She describes the pain as a dull aching pain that is located in her RUQ and LUQ. The pain occasionally travels down the sides of her abdomen and to the center of her chest. She has used oxycodone tabs which help decreased the pain but do not completely resolved it.    She has no associated nausea, vomiting, diarrhea, or fever. She notes no association with the ingestion of food. She has no dysuria or gross hematuria.   On exam: Abdominal exam is benign. Soft, non peritonitic, very mild TTP in R/LUQ.   Unclear exact etiology of patient's pain. She reports a history of constipation and upon further chart review she was noted to have a defecogram in 2021 with the following results:   1.Puborectalis paradoxus during evacuation  2.Pelvic dyssynergia during the pull-up maneuver, and bear down  3.Anterior rectocele (2 cm) with significant postevacuation residual  Perhaps incomplete emptying of stool is leading to significant stool build up and subsequent abdominal pain. Patient does note that she did have a soft bowel movement the day prior to her clinic visit. She states that she was unable to follow up with GI at [facility] or in [place] due to changes in insurance. Patient also denies any association with her bowel movements and precipitation of exacerbation of the abdominal pain.   Given that patient notes no association of pain with meals, the bilateral nature of the pain, benign exam as well as the persistent dull aching nature of the pain, Biliary colic or gallbladder etiology for her symptoms is less likely. CMP this clinic visit is also unremarkable for elevation in her LFTs, provides further evidence this is not the etiology of her pain.   Also considered renal colic as source of patient's pain  given her history of nephrolithiasis. Her urinalysis obtained this clinic visit showed no evidence of hematuria.    Patient is on multiple medications which affect gut motility but she has been stable on these for some time before these symptoms began.   Perhaps, it is abdominal wall pain.  -Will however obtain abdominal xray, RUQ [name]  - Encouraged patient to discontinue oxycodone use as this could slow down her gut motility and potentially exacerbate the pain.  -Also encouraged patient to use suppository/enema every other day until she follows up in clinic in 2 weeks.

## 2022-05-05 NOTE — Progress Notes (Signed)
Internal Medicine Clinic Attending  Case discussed with Dr. Carter  At the time of the visit.  We reviewed the resident's history and exam and pertinent patient test results.  I agree with the assessment, diagnosis, and plan of care documented in the resident's note.  

## 2022-05-15 ENCOUNTER — Ambulatory Visit (HOSPITAL_COMMUNITY)
Admission: RE | Admit: 2022-05-15 | Discharge: 2022-05-15 | Disposition: A | Discharge status: Home / Self Care | Payer: Medicaid Other | Source: Ambulatory Visit | Attending: Internal Medicine | Admitting: Internal Medicine

## 2022-05-15 DIAGNOSIS — R1084 Generalized abdominal pain: Secondary | ICD-10-CM | POA: Diagnosis not present

## 2022-05-15 DIAGNOSIS — R109 Unspecified abdominal pain: Secondary | ICD-10-CM | POA: Diagnosis not present

## 2022-05-16 ENCOUNTER — Encounter: Payer: Medicaid Other | Admitting: Student

## 2022-05-21 ENCOUNTER — Other Ambulatory Visit: Payer: Self-pay | Admitting: Internal Medicine

## 2022-05-21 ENCOUNTER — Telehealth: Payer: Self-pay

## 2022-05-21 ENCOUNTER — Other Ambulatory Visit: Payer: Self-pay | Admitting: Cardiology

## 2022-05-21 DIAGNOSIS — E119 Type 2 diabetes mellitus without complications: Secondary | ICD-10-CM

## 2022-05-21 DIAGNOSIS — I420 Dilated cardiomyopathy: Secondary | ICD-10-CM

## 2022-05-21 DIAGNOSIS — E890 Postprocedural hypothyroidism: Secondary | ICD-10-CM

## 2022-05-21 NOTE — Telephone Encounter (Signed)
called patient to have her schedule an appointment to have her future meds refilled. Refilled carvedilol. Pt did not answer, voice message left.

## 2022-05-22 DIAGNOSIS — M25512 Pain in left shoulder: Secondary | ICD-10-CM | POA: Diagnosis not present

## 2022-05-22 DIAGNOSIS — G8929 Other chronic pain: Secondary | ICD-10-CM | POA: Diagnosis not present

## 2022-05-22 DIAGNOSIS — M961 Postlaminectomy syndrome, not elsewhere classified: Secondary | ICD-10-CM | POA: Diagnosis not present

## 2022-05-22 DIAGNOSIS — G894 Chronic pain syndrome: Secondary | ICD-10-CM | POA: Diagnosis not present

## 2022-05-22 DIAGNOSIS — M5136 Other intervertebral disc degeneration, lumbar region: Secondary | ICD-10-CM | POA: Diagnosis not present

## 2022-05-22 DIAGNOSIS — M79662 Pain in left lower leg: Secondary | ICD-10-CM | POA: Diagnosis not present

## 2022-05-24 ENCOUNTER — Encounter: Payer: Self-pay | Admitting: *Deleted

## 2022-05-26 ENCOUNTER — Ambulatory Visit: Payer: Medicaid Other | Attending: Internal Medicine

## 2022-05-26 DIAGNOSIS — R279 Unspecified lack of coordination: Secondary | ICD-10-CM | POA: Insufficient documentation

## 2022-05-26 DIAGNOSIS — M62838 Other muscle spasm: Secondary | ICD-10-CM | POA: Insufficient documentation

## 2022-05-26 DIAGNOSIS — M6281 Muscle weakness (generalized): Secondary | ICD-10-CM | POA: Insufficient documentation

## 2022-05-26 NOTE — Therapy (Deleted)
OUTPATIENT PHYSICAL THERAPY TREATMENT NOTE   Patient Name: [name] [name] MRN: [id number] DOB:[birth year], 56 y.o., female Today's Date: 05/26/2022  PCP: Angelique Blonder, DO REFERRING PROVIDER: Velna Ochs, MD       Past Medical History:  Diagnosis Date   Abdominal pain 06/25/2010   CT abd/pelvis 05/2014: Very large amount of stool noted distending the colon. The size consistent with severe constipation/fecal impaction.     Abnormal cervical Papanicolaou smear 04/17/2017   Anemia    Arthritis    lumbar region- radiculopathy   Cancer ([place]) 2013    Thyroid Ca-thyroidectomy   Chronic fatigue    DM (diabetes mellitus) (HCC)    Gait disturbance    GERD (gastroesophageal reflux disease)    Headache(784.0)    Herniated lumbar disc without myelopathy 08/20/2016   History of osteoporosis 05/25/2007   DEXA 2014 Since the baseline examination in 2002, there has been a  statistically significant 19.5% increase in bone mineral density in  the lumbar spine and a statistically significant 14.6% increase in  bone mineral density in the left femur.   DEXA 2002 with osteoporosis of lumbar spine and moderate osteopenia of left hip MRI 2002 left hip negative for avascular necrosis  Qualifier: Diagnosis o   HTN (hypertension)    Hypothyroidism    Insomnia 01/02/2015   Insomnia 01/02/2015   Intermittent left-sided chest pain 01/26/2013   Left sided numbness 12/10/2018   Nephrolithiasis    Obesity    OSA (obstructive sleep apnea)    last test- many yrs. 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, treated with surgery   Past Surgical History:  Procedure Laterality Date   BRAIN SURGERY  2001   for sarcoidosis   BREAST BIOPSY Left    BREAST BIOPSY Right    BREAST EXCISIONAL BIOPSY Right    x2 benign    BREAST EXCISIONAL BIOPSY Left    benign   BREAST SURGERY Bilateral    biopsies    DILATION AND CURETTAGE OF UTERUS     HYSTEROSCOPY     LUMBAR LAMINECTOMY/DECOMPRESSION MICRODISCECTOMY Left 08/20/2016   Procedure: Left Lumbar four-five Microdiskectomy;  Surgeon: Leeroy Cha, MD;  Location: [facility];  Service: Neurosurgery;  Laterality: Left;   PARTIAL HYSTERECTOMY  2013   ROTATOR CUFF REPAIR Right    07/2013   Suboccipital craniotomy     THYROIDECTOMY     Precancerous lesion   Patient Active Problem List   Diagnosis Date Noted   Dyssynergic defecation 02/09/2022   Unspecified abdominal pain 02/09/2022   Chronic migraine without aura without status migrainosus, not intractable 01/27/2022   Epistaxis 12/30/2021   Closed fracture of multiple pubic rami ([place]) 06/13/2021   Osteoporosis 06/13/2021   Women's annual routine gynecological examination 05/13/2021   History of abdominal hysterectomy 05/13/2021   Rectocele 05/13/2021   Cough 05/16/2020   Neck mass 02/15/2020   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 08/24/2019   Chronic constipation 08/24/2019   Gluteal pain 05/24/2019   Chronic left shoulder pain 01/30/2019   Obesity 11/24/2016   Healthcare maintenance 02/18/2016   Common migraine with intractable migraine 02/09/2013   Hypothyroidism 05/08/2011   Hyperlipidemia 01/28/2010   Sciatica of left side associated with disorder of lumbar spine 10/20/2007   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  PERTINENT HISTORY: partial hysterectomy, multiple pelvic fractures, hx cancer  PRECAUTIONS: NA  SUBJECTIVE: Pt states that she was sore after last treatment session. She did have pain in Rt flank  again, which typically happens when she needs to have bowel movement - it has been a while since she has had that pain. She feels like new exercises are helpful and she enjoys them. She did have a rough week with having bowel movements and feels like it may be due to not having the internal rectal release techniques last week.    SUBJECTIVE 02/12/22:                                                                                                                                                                                            SUBJECTIVE STATEMENT: Patient states that she is having a lot of issues with bowel movements for years; she has been told that she needs to have surgery to repair. She states that when she feels fullness in rectum she will feel very nauseous and is having trouble eating. Pt states that after having shoulder surgery, she was diagnosed with multiple pelvic fractures with unknown cause - she thinks all fractures were on Rt side of pelvis. She is cleared for all activities now.  Fluid intake: Yes: 48oz of water     Patient confirms identification and approves PT to assess pelvic floor and treatment Yes   PERTINENT HISTORY:  Multiple fractures of pubic ramus Sexual abuse: No   PAIN:  Are you having pain? No     BOWEL MOVEMENT Pain with bowel movement: No - prior to bowel movement and then sometimes during - she feels like she has to lift up stomach to let bowel movement come out. Type of bowel movement:Frequency periods of having small one daily and then periods of going a week without one, Strain Yes, and Splinting sometimes, but more often on abdomen Fully empty rectum: No Leakage: No Pads: No Fiber supplement: No   URINATION Pain with urination: No Fully empty bladder: No Stream: Strong Urgency: No Frequency: every 3 hours Leakage:  none Pads: No   INTERCOURSE Pain with intercourse:  none Ability to have vaginal penetration:  Yes: - Types of  stimulation: - Climax: Yes: - Marinoff Scale    PREGNANCY Vaginal deliveries 0 Tearing No C-section  deliveries 5 Currently pregnant No     PATIENT GOALS to have easier bowel movements     OBJECTIVE 02/12/22:    DIAGNOSTIC FINDINGS:  MRI performed 08/2021 to rule out cauda equina; defecogram demonstrated paradoxical puborectalis contraction/dyssenergia with bearing down; anal manometry demonstrated high tone and weak sphincters   COGNITION:            Overall cognitive status: Within functional limits for tasks assessed                  POSTURE:  Posterior pelvic tilt, decrease hip extension/lumbar lordosis, forward head posture and rounded shoulder   PALPATION: Internal Pelvic Floor Vaginal exam: posterior levator ani tightness and discomfort Rt>Lt; Rectal exam: significant puborectalis and coccygeus tightness and discomfort bil   External Perineal Exam WNL       PELVIC MMT:   MMT   02/11/2022  Vaginal 2/5, pelvic floor endurance 5 seconds, 10 repetitions  Internal Anal Sphincter 1/5  External Anal Sphincter 2/5  Puborectalis 2/5  Diastasis Recti Not checked  (Blank rows = not tested)     TONE: High   PROLAPSE: Unable to assess this treatment session due to dyssnergia of pelvic floor contraction when she is trying to bear down and push; attempted multimodal cues in time we had today to help improve this coordination, but patient unable to achieve; we discussed at length how this has likely contributed to rectocele     TODAY'S TREATMENT 04/10/22 Manual: Soft tissue mobilization: Lt glute Rt flank, obliques, iliac crest muscle attachments Scar tissue mobilization: Myofascial release: Spinal mobilization: Internal pelvic floor techniques: Coccyx mobilizations/traction Puborectalis/posterior levator ani release via rectum bil Dry needling: One trial needle to Lt glute med with consent provided by patient Neuromuscular re-education: Core retraining:  Core  facilitation: Form correction: Pelvic floor contraction training: Down training: Exercises: Stretches/mobility: Strengthening: Therapeutic activities: Functional strengthening activities: Self-care:    TREATMENT 04/03/22: Manual: Soft tissue mobilization: Bil lumbar paraspinals and glutes/deep hip rotators Scar tissue mobilization: Myofascial release: Spinal mobilization: Internal pelvic floor techniques: Dry needling: Neuromuscular re-education: Core retraining:  Core facilitation: Form correction: Pelvic floor contraction training: Down training: Exercises: Stretches/mobility: Lower trunk rotation 3 x 10 Single knee to chest 5x bil Bent knee fall out 5 x bil Strengthening: Bridge 2 x 10 Clam shells 10x Therapeutic activities: Functional strengthening activities: Self-care:    TREATMENT 03/27/22: Manual: Soft tissue mobilization: Bil lumbar paraspinals  Lt posterolateral hip mm Scar tissue mobilization: Myofascial release: Spinal mobilization: Internal pelvic floor techniques: Internal rectal release of pelvic floor muscles  Coccygeal mobs and traction Manual training for pelvic floor bulge/relaxation  No emotional/communication barriers or cognitive limitation. Patient is motivated to learn. Patient understands and agrees with treatment goals and plan. PT explains patient will be examined in standing, sitting, and lying down to see how their muscles and joints work. When they are ready, they will be asked to remove their underwear so PT can examine their perineum. The patient is also given the option of providing their own chaperone as one is not provided in our facility. The patient also has the right and is explained the right to defer or refuse any part of the evaluation or treatment including the internal exam. With the patient's consent, PT will use one gloved finger to gently assess the muscles of the pelvic floor, seeing how well it contracts and relaxes  and if there is muscle symmetry. After, the patient will get dressed and PT and patient will discuss  exam findings and plan of care. PT and patient discuss plan of care, schedule, attendance policy and HEP activities.  Dry needling: Neuromuscular re-education: Core retraining:  Core facilitation: Form correction: Pelvic floor contraction training: Down training: Exercises: Stretches/mobility: Strengthening: Therapeutic activities: Functional strengthening activities: Self-care: Poise impressa - sizing kit Pessary       PATIENT EDUCATION:  Education details: HEP progressions Person educated: Patient Education method: Consulting civil engineer, Demonstration, Tactile cues, Verbal cues, and Handouts Education comprehension: verbalized understanding     HOME EXERCISE PROGRAM: [id number]   ASSESSMENT:   CLINICAL IMPRESSION: Return to internal manual techniques to pelvic floor and coccyx in order to decrease tension/restriction; since these techniques were not performed last treatment session and she had a more difficult week with bowel movements, it will be a good test to see if these techniques are what is helping to increase ease/frequency of bowel movements. We also performed manual techniques to Rt flank to see if it would decrease any restriction that is causing difficulty with bowel movements and increasing pain when she is unable to evacuate. She was willing to trial DN to Lt glutes to see if she could tolerate it; she did very well and had significant twitch response. She will continue to benefit from skilled PT intervention in order to address impairments, improve ability to have regular and complete bowel movements, and improve QOL.      OBJECTIVE IMPAIRMENTS decreased activity tolerance, decreased coordination, decreased endurance, decreased mobility, decreased ROM, decreased strength, hypomobility, increased fascial restrictions, increased muscle spasms, impaired tone, postural  dysfunction, and pain.    ACTIVITY LIMITATIONS  bowel movements .    PERSONAL FACTORS 1-2 comorbidities: partial hysterectomy 2013; multiple right pelvic fractures October 2022  are also affecting patient's functional outcome.      REHAB POTENTIAL: Good   CLINICAL DECISION MAKING: Stable/uncomplicated   EVALUATION COMPLEXITY: Low     GOALS: Goals reviewed with patient? Yes   SHORT TERM GOALS: Target date: 03/11/2022   Pt will be independent with HEP.  Baseline:  Goal status: MET   2.  Pt will be independent with use of squatty potty, relaxed toileting mechanics, and improved bowel movement techniques in order to increase ease of bowel movements and complete evacuation.    Baseline:  Goal status: IN PROGRESS   3.  Pt will be able to correctly perform diaphragmatic breathing and appropriate pressure management in order to prevent worsening vaginal wall laxity and improve pelvic floor A/ROM.    Baseline:  Goal status: IN PROGRESS     LONG TERM GOALS: Target date: 05/06/2022   Pt will be independent with advanced HEP.  Baseline:  Goal status: IN PROGRESS   2.  Pt will demonstrate normal pelvic floor muscle tone and A/ROM, able to achieve 4/5 strength with contractions and 10 sec endurance, in order to provide appropriate lumbopelvic support in functional activities.    Baseline:  Goal status: IN PROGRESS   3.  Pt will report increased to 5 bowel movements a week with no straining and appropriate voiding mechanics.  Baseline:  Goal status: IN PROGRESS   4.  Pt will report no lower abdominal pain or nausea greater than 2/10.  Baseline:  Goal status: IN PROGRESS     PLAN: PT FREQUENCY: 1x/week   PT DURATION: 12 weeks   PLANNED INTERVENTIONS: Therapeutic exercises, Therapeutic activity, Neuromuscular re-education, Balance training, Gait training, Patient/Family education, Joint mobilization, Dry Needling, and Manual therapy   PLAN FOR NEXT SESSION: Continue DN to Lt  glutes if tolerated well; continue manual techniques to coccyx/pelvic floor; progress gentle strengthening.     Heather Roberts, PT, DPT06/26/231:49 PM

## 2022-05-31 DIAGNOSIS — Z419 Encounter for procedure for purposes other than remedying health state, unspecified: Secondary | ICD-10-CM | POA: Diagnosis not present

## 2022-06-01 ENCOUNTER — Other Ambulatory Visit: Payer: Self-pay | Admitting: Internal Medicine

## 2022-06-01 ENCOUNTER — Other Ambulatory Visit: Payer: Self-pay | Admitting: Student

## 2022-06-01 ENCOUNTER — Other Ambulatory Visit: Payer: Self-pay | Admitting: Cardiology

## 2022-06-01 DIAGNOSIS — E785 Hyperlipidemia, unspecified: Secondary | ICD-10-CM

## 2022-06-01 DIAGNOSIS — I420 Dilated cardiomyopathy: Secondary | ICD-10-CM

## 2022-06-01 DIAGNOSIS — E119 Type 2 diabetes mellitus without complications: Secondary | ICD-10-CM

## 2022-06-10 ENCOUNTER — Telehealth: Payer: Self-pay | Admitting: Student

## 2022-06-10 NOTE — Telephone Encounter (Signed)
Patient has an appointment scheduled for tomorrow morning.

## 2022-06-10 NOTE — Telephone Encounter (Signed)
Patient is sch for   Name: [name], [name] MRN: [id number]  Date: 06/11/2022 Status: Sch  Time: 8:45 AM Length: 30  Visit Type: OPEN ESTABLISHED [726] Copay: $0.00  Provider: Drucie Opitz, MD     Pt states Yesterday she had Chest pains, and Constipation.  Patient states today the pain has moved to her Right shoulder and Neck.  Pt is requesting a call back to make sure it is ok to wait for tomorrow appointment.

## 2022-06-10 NOTE — Telephone Encounter (Signed)
Call to patient states that she got backed up.  Took a Prune-lax with some juice was relieved.  No chest pain now.  Pain  in neck and right shoulder now.  Has been having gas.  Took some Gas a lot.  Has a procedure planned for surgery to help with bowel movements.  Feeling better is just tired from the constipation.  Patient has an appointment with her Cardiac doctor soon.

## 2022-06-11 ENCOUNTER — Encounter: Payer: Self-pay | Admitting: Student

## 2022-06-11 ENCOUNTER — Ambulatory Visit: Payer: Medicaid Other | Admitting: Student

## 2022-06-11 ENCOUNTER — Other Ambulatory Visit: Payer: Self-pay

## 2022-06-11 ENCOUNTER — Other Ambulatory Visit: Payer: Self-pay | Admitting: Student

## 2022-06-11 VITALS — BP 145/83 | HR 114 | Temp 98.1°F | Ht 68.0 in | Wt 197.8 lb

## 2022-06-11 DIAGNOSIS — I10 Essential (primary) hypertension: Secondary | ICD-10-CM | POA: Diagnosis not present

## 2022-06-11 DIAGNOSIS — M25511 Pain in right shoulder: Secondary | ICD-10-CM

## 2022-06-11 DIAGNOSIS — K5909 Other constipation: Secondary | ICD-10-CM

## 2022-06-11 DIAGNOSIS — I420 Dilated cardiomyopathy: Secondary | ICD-10-CM

## 2022-06-11 DIAGNOSIS — M25512 Pain in left shoulder: Secondary | ICD-10-CM

## 2022-06-11 DIAGNOSIS — Z7984 Long term (current) use of oral hypoglycemic drugs: Secondary | ICD-10-CM | POA: Diagnosis not present

## 2022-06-11 DIAGNOSIS — G8929 Other chronic pain: Secondary | ICD-10-CM | POA: Diagnosis not present

## 2022-06-11 DIAGNOSIS — E785 Hyperlipidemia, unspecified: Secondary | ICD-10-CM

## 2022-06-11 DIAGNOSIS — E119 Type 2 diabetes mellitus without complications: Secondary | ICD-10-CM | POA: Diagnosis not present

## 2022-06-11 MED ORDER — CARVEDILOL 6.25 MG PO TABS: 6.2500 mg | ORAL_TABLET | Freq: Two times a day (BID) | ORAL | 2 refills | Status: DC

## 2022-06-11 MED ORDER — METHOCARBAMOL 750 MG PO TABS: 750.0000 mg | ORAL_TABLET | Freq: Three times a day (TID) | ORAL | 1 refills | Status: DC | PRN

## 2022-06-11 MED ORDER — CARVEDILOL 6.25 MG PO TABS: 6.2500 mg | ORAL_TABLET | Freq: Two times a day (BID) | ORAL | 0 refills | Status: DC

## 2022-06-11 MED ORDER — METHOCARBAMOL 750 MG PO TABS: 750.0000 mg | ORAL_TABLET | Freq: Three times a day (TID) | ORAL | 0 refills | Status: DC | PRN

## 2022-06-11 NOTE — Progress Notes (Signed)
CC: Right sided neck and shoulder pain  HPI:  [name] is a 56 y.o. female living with a history stated below and presents today for R neck and shoulder pain. Please see problem based assessment and plan for additional details.  Past Medical History:  Diagnosis Date   Abdominal pain 06/25/2010   CT abd/pelvis 05/2014: Very large amount of stool noted distending the colon. The size consistent with severe constipation/fecal impaction.     Abnormal cervical Papanicolaou smear 04/17/2017   Anemia    Arthritis    lumbar region- radiculopathy   Cancer ([place]) 2013    Thyroid Ca-thyroidectomy   Chronic fatigue    DM (diabetes mellitus) (HCC)    Gait disturbance    GERD (gastroesophageal reflux disease)    Headache(784.0)    Herniated lumbar disc without myelopathy 08/20/2016   History of osteoporosis 05/25/2007   DEXA 2014 Since the baseline examination in 2002, there has been a  statistically significant 19.5% increase in bone mineral density in  the lumbar spine and a statistically significant 14.6% increase in  bone mineral density in the left femur.   DEXA 2002 with osteoporosis of lumbar spine and moderate osteopenia of left hip MRI 2002 left hip negative for avascular necrosis  Qualifier: Diagnosis o   HTN (hypertension)    Hypothyroidism    Insomnia 01/02/2015   Insomnia 01/02/2015   Intermittent left-sided chest pain 01/26/2013   Left sided numbness 12/10/2018   Nephrolithiasis    Obesity    OSA (obstructive sleep apnea)    last test- many yrs. 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 18 g 1   aspirin 81 MG chewable tablet Chew 81 mg by mouth daily.     atorvastatin (LIPITOR) 20 MG tablet TAKE 1 TABLET BY MOUTH EVERY DAY 90 tablet 3   B-D ULTRAFINE III SHORT PEN 31G X 8 MM MISC USE TO INJECT INSULIN 1 TIME DAILY DIAG CODE E11.9 INSULIN DEPENDENT 100 each 4   blood glucose meter kit and supplies KIT Dispense based on patient and insurance preference. Use up to four times daily as directed. (FOR ICD-9 250.00, 250.01). 1 each 0   Dulaglutide (TRULICITY) 4.09 WJ/1.9JY SOPN Inject 1.5 mg into the skin once a week. 6 mL 3   DULoxetine (CYMBALTA) 30 MG capsule duloxetine 30 mg capsule,delayed release  TAKE 1 CAPSULE BY MOUTH EVERY DAY     fluticasone (FLONASE) 50 MCG/ACT nasal spray SPRAY 1 SPRAY INTO BOTH NOSTRILS DAILY. 16 mL 5   folic acid (FOLVITE) 1 MG tablet TAKE 1 TABLET BY MOUTH EVERY DAY 90 tablet 3   Fremanezumab-vfrm (AJOVY) 225 MG/1.5ML SOAJ Inject 225 mg into the skin every 30 (thirty) days. 1.5 mL 11   JARDIANCE 10 MG TABS tablet TAKE 1 TABLET BY MOUTH EVERY DAY 90 tablet 3  levothyroxine (SYNTHROID) 137 MCG tablet TAKE 1 TABLET BY MOUTH EVERY DAY BEFORE BREAKFAST 90 tablet 1   losartan (COZAAR) 50 MG tablet TAKE 1 TABLET BY MOUTH EVERY DAY 90 tablet 3   metFORMIN (GLUCOPHAGE) 1000 MG tablet Take 1 tablet (1,000 mg total) by mouth 2 (two) times daily with a meal. 180 tablet 1   methotrexate (RHEUMATREX) 2.5 MG tablet TAKE 1 TABLET (2.5 MG TOTAL) BY MOUTH ONCE A WEEK. CAUTION:CHEMOTHERAPY. PROTECT FROM LIGHT. 12 tablet 2   Multiple Vitamin (MULTIVITAMIN WITH MINERALS) TABS tablet Take 1 tablet by mouth every evening.      pantoprazole (PROTONIX) 20 MG tablet Take 1 tablet (20 mg total) by mouth daily. 90 tablet 1   polyethylene glycol (COLYTE) 240 g solution Drink 8 ounces every 10 minutes until the entire  solution is consumed 4000 mL 0   Ubrogepant (UBRELVY) 100 MG TABS Take 100 mg by mouth every 2 (two) hours as needed. Maximum 229m a day. 16 tablet 11   No current facility-administered medications on file prior to visit.    Family History  Problem Relation Age of Onset   Heart disease Mother        questionable CAD and arrythmias    Cancer Mother        Breast cancer   Breast cancer Mother 670  Venous thrombosis Sister    Diabetes Sister    Heart disease Sister    Diabetes Brother    Heart disease Brother    Cancer Maternal Aunt    Breast cancer Maternal Aunt    Venous thrombosis Sister    Diabetes Sister    Heart disease Sister     Social History   Socioeconomic History   Marital status: Married    Spouse name: JNeill Loft  Number of children: 4   Years of education: 12   Highest education level: Not on file  Occupational History    Employer: UNEMPLOYED  Tobacco Use   Smoking status: Never   Smokeless tobacco: Never  Vaping Use   Vaping Use: Never used  Substance and Sexual Activity   Alcohol use: No    Alcohol/week: 0.0 standard drinks of alcohol   Drug use: No   Sexual activity: Not Currently    Birth control/protection: Surgical    Comment: hysterectomy 2013  Other Topics Concern   Not on file  Social History Narrative   [place]    Drinks very little caffeine   Patient is right handed.    Social Determinants of Health   Financial Resource Strain: Not on file  Food Insecurity: Food Insecurity Present (05/13/2021)   Hunger Vital Sign    Worried About Running Out of Food in the Last Year: Sometimes true    Ran Out of Food in the Last Year: Sometimes true  Transportation Needs: No Transportation Needs (05/13/2021)   PRAPARE - THydrologist(Medical): No    Lack of Transportation (Non-Medical): No  Physical Activity: Not on file  Stress: Not on file  Social Connections: Not on file  Intimate Partner  Violence: Not on file    Review of Systems: ROS negative except for what is noted on the assessment and plan.  Vitals:   06/11/22 0851  BP: (!) 145/83  Pulse: (!) 114  Temp: 98.1 F (36.7 C)  TempSrc: Oral  SpO2: 100%  Weight: 197 lb 12.8 oz (89.7 kg)  Height: _0  (1.727 m)  Physical Exam: Constitutional: well-appearing woman, uncomfortably sitting, in no acute distress HENT: normocephalic atraumatic, mucous membranes moist Eyes: conjunctiva non-erythematous Cardiovascular: regular rate and rhythm, no m/r/g Pulmonary/Chest: normal work of breathing on room air, lungs clear to auscultation bilaterally MSK: normal bulk and tone, no redness, masses, on R neck and shoulder. Tenderness is present on R Neck and shoulder. Skin: warm and dry   Assessment & Plan:   Right shoulder pain Patient presents today with right-sided neck pain that extends to her right shoulder.  Pain starts on the base of her neck on the right side and radiates to the back of the shoulder.  Patient denies pain radiating down her arm.  Pain is localized to that region.  Pain started 2 days ago, however she states that she has had a pain similar to this last year.  Pain is constant, and patient has tried to use hot showers and half of her oxycodone to alleviate the pain.  Patient states the pain is a 5 out of 10 on rest, and on movement it goes up to about a 7 or 8 out of 10.  Patient also states that anytime she has to use her neck (while swallowing, or using a straw) there is a significant amount of pain as well.  Patient denies any trauma or falls in that area.  On physical exam there is no erythema, swelling, or masses.  The region is tender to palpation.  Patient states on internal rotation and flexion, the pain is the most.    Plan:  1. This pain is most likely musculoskeletal, affecting her sternocleidomastoid, trapezius, and anterior scalene muscles.  We will be giving her Robaxin 750 mg to be taken as  needed.  2.  Recommended some neck stretching exercises to be performed at home.     Essential hypertension Patient's blood pressure was elevated today at 145/83.her pulse today was 114.  Patient was previously on carvedilol, she has not been able to see a cardiologist due to [name] insurance change however she has [name] appointment within the next 2 months. Plan: 1.  Will prescribe carvedilol 6.25 mg 2.  Recommended to take blood pressure at home  Hyperlipidemia Patient's last lipid panel was done on February 07, 2022, which showed normal cholesterol, triglycerides, LDL, HDL.  She is currently on atorvastatin 20 mg once a day.  1.  We will do a lipid panel today, as she is overdue. We will call patient with the results as soon as they arrive.  Chronic constipation Patient called the internal medicine office on July 11, complaining of chest pain.  Evaluation of chest pain was done today however it seems unlikely that its [name] angina because of the fact that the pain is constant and has nothing to do with exertion.  Chest pain was most likely due to chronic constipation, as patient states that chest pain was relieved upon having a bowel movement.  Plan:   1.  Patient is scheduled to see gastroenterology on July 24, we will follow-up after the appointment.  Patient seen with Dr. Thomasene Ripple, M.D. [facility], PGY-1 Phone: [phone] Date 06/11/2022 Time 6:12 PM

## 2022-06-11 NOTE — Assessment & Plan Note (Signed)
Patient's blood pressure was elevated today at 145/83.her pulse today was 114.  Patient was previously on carvedilol, she has not been able to see a cardiologist due to an insurance change however she has an appointment within the next 2 months. Plan: 1.  Will prescribe carvedilol 6.25 mg 2.  Recommended to take blood pressure at home

## 2022-06-11 NOTE — Assessment & Plan Note (Addendum)
Patient presents today with right-sided neck pain that extends to her right shoulder.  Pain starts on the base of her neck on the right side and radiates to the back of the shoulder.  Patient denies pain radiating down her arm.  Pain is localized to that region.  Pain started 2 days ago, however she states that she has had a pain similar to this last year.  Pain is constant, and patient has tried to use hot showers and half of her oxycodone to alleviate the pain.  Patient states the pain is a 5 out of 10 on rest, and on movement it goes up to about a 7 or 8 out of 10.  Patient also states that anytime she has to use her neck (while swallowing, or using a straw) there is a significant amount of pain as well.  Patient denies any trauma or falls in that area.  On physical exam there is no erythema, swelling, or masses.  The region is tender to palpation.  Patient states on internal rotation and flexion, the pain is the most.    Plan:  1. This pain is most likely musculoskeletal, affecting her sternocleidomastoid, trapezius, and anterior scalene muscles.  We will be giving her Robaxin 750 mg to be taken as needed.  2.  Recommended some neck stretching exercises to be performed at home.

## 2022-06-11 NOTE — Assessment & Plan Note (Signed)
Patient called the internal medicine office on July 11, complaining of chest pain.  Evaluation of chest pain was done today however it seems unlikely that its an angina because of the fact that the pain is constant and has nothing to do with exertion.  Chest pain was most likely due to chronic constipation, as patient states that chest pain was relieved upon having a bowel movement.  Plan:   1.  Patient is scheduled to see gastroenterology on July 24, we will follow-up after the appointment.

## 2022-06-11 NOTE — Patient Instructions (Signed)
Thank you so much for coming to the clinic today [facility]  Today we talked about a few things here is a quick summary  1.  We talked about how you are having some neck and shoulder pain as well as some chest pain from last night.  Hopefully your chest pain gets better as you have more bowel movements, and we gave you some medicine to help with the neck and shoulder pain as well.  2.  We are getting some labs today just to check your sugar and your cholesterol, I will call you with the results.  If you have any questions please feel free to contact the clinic at [phone]. Thank you.  Dr. Marlene Lard Sinead Hockman

## 2022-06-11 NOTE — Addendum Note (Signed)
Addended by: Riesa Pope on: 06/11/2022 06:53 PM   Modules accepted: Orders

## 2022-06-11 NOTE — Assessment & Plan Note (Addendum)
Patient's last lipid panel was done on February 07, 2022, which showed normal cholesterol, triglycerides, LDL, HDL.  She is currently on atorvastatin 20 mg once a day.  1.  We will do a lipid panel today, as she is overdue. We will call patient with the results as soon as they arrive.

## 2022-06-13 MED ORDER — METHOCARBAMOL 750 MG PO TABS: 750.0000 mg | ORAL_TABLET | Freq: Three times a day (TID) | ORAL | 0 refills | Status: DC | PRN

## 2022-06-15 NOTE — Progress Notes (Unsigned)
Cardiology Office Note:    Date:  06/25/2022   ID:  [name], DOB [birth year], MRN [id number]  PCP:  [name] Blonder, DO  Cardiologist:  None  Electrophysiologist:  None   Referring MD: [name] Presto, MD   Chief Complaint: routine follow-up of non-ischemic cardiomyopathy  History of Present Illness:    [name] [name] is a 56 y.o. 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  Patient has a long history of atypical chest pain. Myoview in 04/2011 showed EF of 49% with a small apical defect felt to mostly likely be from soft tissues attenuation (although small infarct could not be excluded) but no ischemia. Stress Echo in 03/2013 was normal. Exercise Myoview in 12/2018 showed LVEF of 43% with no evidence of ischemia or prior infarct. Last Echo in 12/2018 showed LVEF of 45-50% with no regional wall motion abnormalities and grade 1 diastolic dysfunction.   Patient was last seen by [name] [name] in 12/2019 at which time she denied any chest pain, dyspnea, palpitations, or syncope. Given unclear etiology for her cardiomyopathy and history of sarcoidosis of her brain, cardiac MRI was ordered. Cardiac MRI in 01/2020 showed mildly dilated LV with EF of 44% with diffuse hypokinesis and no regional wall motion abnormalities as well as mildly dilated left atrium and trivial MR and TR. There was no evidence of infiltrative or inflammatory cardiomyopathy. Findings were consistent with non-ischemic dilated cardiomyopathy.  Patient presents today for follow-up.  Patient is doing well overall.  She does report some atypical lower chest/upper abdominal pain over the last 2 weeks that has  coincided with severe constipation. Pain has improved as she has been having more bowel movements. She states she was recently prescribed medicine to "clean her out" but she needs to pick this up from the Pharmacy. She is still having some discomfort that occurs randomly (she is still constipated) but states it is much improved. She does states this feels similar but not exactly the same to the atypical chest pain she has had in the past. She denies any shortness of breath, orthopnea, PND, lower extremity edema, palpitations, lightheadedness, dizziness, or syncope.  She is scheduled to have a colonoscopy in October 2023 and states she then also may need to have surgery on her colon because it is "messed up." She is easily able to complete >4.0. She stays active riding bikes and walking with her husband and denies any chest pain with these activities which is very reassuring.   Past Medical History:  Diagnosis Date   Abdominal pain 06/25/2010   CT abd/pelvis 05/2014: Very large amount of stool noted distending the colon. The size consistent with severe constipation/fecal impaction.     Abnormal cervical Papanicolaou smear 04/17/2017   Anemia    Arthritis    lumbar region- radiculopathy   Atypical chest pain    Cancer ([place]) 2013    Thyroid Ca-thyroidectomy   Chronic fatigue    DM (diabetes mellitus) (HCC)    Gait disturbance    GERD (gastroesophageal reflux disease)    Headache(784.0)    Herniated lumbar disc without myelopathy 08/20/2016   History of osteoporosis 05/25/2007   DEXA 2014 Since the baseline examination in 2002, there has been a  statistically significant 19.5% increase in bone mineral density in  the lumbar spine and a statistically significant 14.6% increase in  bone mineral density in the left femur.   DEXA 2002 with osteoporosis of lumbar spine and moderate osteopenia of left hip MRI 2002 left hip negative for avascular necrosis  Qualifier: Diagnosis o   HTN (hypertension)     Hyperlipidemia    Hypothyroidism    Insomnia 01/02/2015   Intermittent left-sided chest pain 01/26/2013   Left sided numbness 12/10/2018   Nephrolithiasis    Nonischemic cardiomyopathy (HCC)    Obesity    OSA (obstructive sleep apnea)    last test- many yrs. ago, done at [facility], no CPAP in use   Osteoporosis    Pain and swelling of lower leg, left 12/16/2017   Peripheral neuropathy    Prolonged QT interval    Rectocele 07/23/2020   [facility]   Sarcoidosis    sarcoidosis in Brain, treated with surgery    Past Surgical History:  Procedure Laterality Date   BRAIN SURGERY  2001   for sarcoidosis   BREAST BIOPSY Left    BREAST BIOPSY Right    BREAST EXCISIONAL BIOPSY Right    x2 benign   BREAST EXCISIONAL BIOPSY Left    benign   BREAST SURGERY Bilateral    biopsies    DILATION AND CURETTAGE OF UTERUS     HYSTEROSCOPY     LUMBAR LAMINECTOMY/DECOMPRESSION MICRODISCECTOMY Left 08/20/2016   Procedure: Left Lumbar four-five Microdiskectomy;  Surgeon: Leeroy Cha, MD;  Location: [facility];  Service: Neurosurgery;  Laterality: Left;   PARTIAL HYSTERECTOMY  2013   ROTATOR CUFF REPAIR Right    07/2013   Suboccipital craniotomy     THYROIDECTOMY     Precancerous lesion    Current Medications: Current Meds  Medication Sig   ACCU-CHEK GUIDE test strip USE AS INSTRUCTED TO CHECK BLOOD SUGAR UP TO 1 TIME A DAY   albuterol (VENTOLIN HFA) 108 (90 Base) MCG/ACT inhaler Inhale 2 puffs into the lungs every 6 (six) hours as needed for wheezing or shortness of breath.   amLODipine (NORVASC) 5 MG tablet Take 1 tablet by mouth daily.   aspirin 81 MG chewable tablet Chew 81 mg by mouth daily.   atorvastatin (LIPITOR) 20 MG tablet TAKE 1 TABLET BY MOUTH EVERY DAY   B-D ULTRAFINE III SHORT PEN 31G X 8 MM MISC USE TO INJECT INSULIN 1 TIME DAILY DIAG CODE E11.9 INSULIN DEPENDENT   blood glucose meter kit and supplies KIT Dispense based on patient and insurance preference. Use up to four times daily  as directed. (FOR ICD-9 250.00, 250.01).   carvedilol (COREG) 6.25 MG tablet Take 1 tablet (6.25 mg total) by mouth 2 (two) times daily with a meal.   cycloSPORINE (RESTASIS) 0.05 % ophthalmic emulsion Place 1 drop into both eyes 2 (two) times daily.   Dulaglutide (TRULICITY) 1.94 RD/4.0CX SOPN Inject 1.5 mg into the skin once a week.   fluticasone (FLONASE) 50 MCG/ACT nasal spray SPRAY 1 SPRAY INTO BOTH NOSTRILS DAILY.   folic acid (FOLVITE) 1 MG tablet TAKE 1 TABLET BY MOUTH EVERY DAY   JARDIANCE 10 MG TABS tablet TAKE 1 TABLET BY MOUTH EVERY DAY   levothyroxine (SYNTHROID) 137 MCG tablet TAKE 1 TABLET BY MOUTH EVERY DAY BEFORE BREAKFAST   losartan (COZAAR) 50 MG tablet TAKE 1 TABLET BY MOUTH EVERY DAY   metFORMIN (GLUCOPHAGE) 1000 MG tablet Take 1 tablet (1,000 mg total) by mouth 2 (two)  times daily with a meal.   methocarbamol (ROBAXIN-750) 750 MG tablet Take 1 tablet (750 mg total) by mouth every 8 (eight) hours as needed for muscle spasms.   methotrexate (RHEUMATREX) 2.5 MG tablet TAKE 1 TABLET (2.5 MG TOTAL) BY MOUTH ONCE A WEEK. CAUTION:CHEMOTHERAPY. PROTECT FROM LIGHT.   Multiple Vitamin (MULTIVITAMIN WITH MINERALS) TABS tablet Take 1 tablet by mouth every evening.    ondansetron (ZOFRAN-ODT) 4 MG disintegrating tablet Take by mouth.   oxyCODONE (ROXICODONE) 15 MG immediate release tablet Take 30 mg by mouth every 6 (six) hours.   pantoprazole (PROTONIX) 20 MG tablet Take 1 tablet (20 mg total) by mouth daily.   polyethylene glycol (COLYTE) 240 g solution Drink 8 ounces every 10 minutes until the entire solution is consumed   Ubrogepant (UBRELVY) 100 MG TABS Take 100 mg by mouth every 2 (two) hours as needed. Maximum 251m a day.     Allergies:   Lisinopril   Social History   Socioeconomic History   Marital status: Married    Spouse name: JNeill Loft  Number of children: 4   Years of education: 12   Highest education level: Not on file  Occupational History    Employer:  UNEMPLOYED  Tobacco Use   Smoking status: Never   Smokeless tobacco: Never  Vaping Use   Vaping Use: Never used  Substance and Sexual Activity   Alcohol use: No    Alcohol/week: 0.0 standard drinks of alcohol   Drug use: No   Sexual activity: Not Currently    Birth control/protection: Surgical    Comment: hysterectomy 2013  Other Topics Concern   Not on file  Social History Narrative   [place]    Drinks very little caffeine   Patient is right handed.    Social Determinants of Health   Financial Resource Strain: Not on file  Food Insecurity: Food Insecurity Present (05/13/2021)   Hunger Vital Sign    Worried About Running Out of Food in the Last Year: Sometimes true    Ran Out of Food in the Last Year: Sometimes true  Transportation Needs: No Transportation Needs (05/13/2021)   PRAPARE - THydrologist(Medical): No    Lack of Transportation (Non-Medical): No  Physical Activity: Not on file  Stress: Not on file  Social Connections: Not on file     Family History: The patient's family history includes Breast cancer in her maternal aunt; Breast cancer (age of onset: 656 in her mother; Cancer in her maternal aunt and mother; Diabetes in her brother, sister, and sister; Heart disease in her brother, mother, sister, and sister; Venous thrombosis in her sister and sister.  ROS:   Please see the history of present illness.     EKGs/Labs/Other Studies Reviewed:    The following studies were reviewed today:  Myoview 12/09/2018: Nuclear stress EF: 43%. The left ventricular ejection fraction is moderately decreased (30-44%). The patient walked for 6 minutes of a standard Bruce protocol treadmill test. She achieved a peak heart rate of 162 which is 95% predicted maximal heart rate. At peak exercise there were no ST or T wave changes to suggest ischemia. This is a low risk study. There is no evidence of ischemia and no evidence of previous  infarction. The left ventricular systolic function is mildly depressed. _______________  Echocardiogram 12/13/2018: Study Conclusions: - Left ventricle: The cavity size was normal. Wall thickness was    normal. Systolic function was  mildly reduced. The estimated    ejection fraction was in the range of 45% to 50%. Wall motion was    normal; there were no regional wall motion abnormalities. Doppler    parameters are consistent with abnormal left ventricular    relaxation (grade 1 diastolic dysfunction). _______________  Cardiac MRI 01/27/2020: Impressions: 1. Mildly dilated left ventricle with normal wall thickness and mildly decreased systolic function (LVEF = 44%). There is diffuse hypokinesis and no regional wall motion abnormalities. There is no late gadolinium enhancement in the left ventricular myocardium. 2. Normal right ventricular size, thickness and systolic function (LVEF = 63%). There are no regional wall motion abnormalities. 3. Mildly dilated left atrium (44 mm), normal size of the right atrium. 4.  Trivial mitral and tricuspid regurgitation.   There is no evidence for infiltrative or inflammatory cardiomyopathy, no evidence for cardiac sarcoidosis. These findings are consistent with non-ischemic dilated cardiomyopathy.  EKG:  EKG ordered today. EKG personally reviewed and demonstrates normal sinus rhythm, rate 85 bpm, with non-specific T wave changes. Normal axis. QTc 461 ms.  Recent Labs: 01/27/2022: Hemoglobin 12.8; Platelets 319 05/02/2022: ALT 20; BUN 19; Creatinine, Ser 0.76; Potassium 5.0; Sodium 142; TSH 1.090  Recent Lipid Panel    Component Value Date/Time   CHOL 125 06/16/2022 1434   TRIG 55 06/16/2022 1434   HDL 48 06/16/2022 1434   CHOLHDL 2.6 06/16/2022 1434   CHOLHDL 2.3 12/06/2014 1552   VLDL 14 12/06/2014 1552   LDLCALC 65 06/16/2022 1434    Physical Exam:    Vital Signs: BP 124/66   Pulse 85   Ht 5' 8" (1.727 m)   Wt 199 lb 9.6 oz (90.5 kg)    LMP 12/03/2011   SpO2 98%   BMI 30.35 kg/m     Wt Readings from Last 3 Encounters:  06/25/22 199 lb 9.6 oz (90.5 kg)  06/11/22 197 lb 12.8 oz (89.7 kg)  05/02/22 202 lb 14.4 oz (92 kg)     General: 56 y.o. African-American female in no acute distress. HEENT: Normocephalic and atraumatic. Sclera clear.  Neck: Supple. No JVD. Heart: RRR. Distinct S1 and S2. No murmurs, gallops, or rubs.  Lungs: No increased work of breathing. Clear to ausculation bilaterally. No wheezes, rhonchi, or rales.  Abdomen: Soft, non-distended, and non-tender to palpation.  Extremities: No lower extremity edema.    Skin: Warm and dry. Neuro: Alert and oriented x3. No focal deficits. Psych: Normal affect. Responds appropriately.   Assessment:    1. Nonischemic cardiomyopathy ([place])   2. Atypical chest pain     Plan:    Non-Ischemic Cardiomyopathy  Echo in 12/2018 showed LVEF of 45-50% with no regional wall motion abnormalities and grade 1 diastolic dysfunction. Cardiac MRI in 01/2020 showed mildly dilated LV with EF of 44% with diffuse hypokinesis and no regional wall motion abnormalities as well as mildly dilated left atrium and trivial MR and TR. There was no evidence of infiltrative or inflammatory cardiomyopathy. Findings were consistent with non-ischemic dilated cardiomyopathy. - Euvolemic on exam. - Continue Losartan 60m daily. - Continue Coreg 6.240mtwice daily. - Continue Jardiance 1075maily. - Will update Echo to reassess LV function. If EF has dropped, [facility] ischemic evaluation.  Atypical Chest Pain Patient has a long history of atypical chest pain and has had multiple stress tests in the past. Last Myoview in 2020 was low risk with no evidence of ischemia or prior infarct.  - Patient  reports atypical chest pain/abdominal pain over the last 2 weeks which seems to be due to severe constipation.  - EKG shows no acute ischemic changes. -  Patient denies any chest pain with physical activity such as riding her bike and walking which is very reassuring. I don't think this is cardiac chest pain at this time. Suspect this is due to constipation as symptoms are improving with treatment of constipation.  - Advised patient to let us know if she continues to have chest pain have constipation has resolved.  Hypertension BP well controlled. - Continue current medications: Amlodipine 32m daily, Losartan 567mdaily, and Coreg 6.2582mwiced aily.  Hyperlipidemia Recent lipid panel on 06/16/2022: Total Cholesterol 125, Triglycerides 55,  HDL 48, LDL 65. - Continue Lipitor 50m36mily. - Labs followed by PCP.  Type 2 Diabetes Mellitus Hemoglobin A1c 6.8% on 06/16/2022. - On Metformin, Trulicity, Jardiance, and Insulin at home. - Management per PCP.  History of Prolonged QT History of QT prolongation.  - QTc 461ms78mEKG today.   Pre-Op Evaluation Patient reports that she is going to need a colonoscopy in 08/2022. Patient is stable from a cardiac standpoint. She has atypical chest pain as described above which sounds like it is due to constipation and not cardiac in nature. No acute CHF symptoms, palpitations, dizziness, or syncope. She is able to complete >4.0 METs of activity with any chest pain. Therefore, based on ACC/AHA guidelines, patient would be at acceptable risk for the planned colonoscopy without further cardiovascular testing. I will route this recommendation to the requesting party via Epic fax function.  Of note, I am repeating an Echo as above to reassess LV function; however, she has no CHF symptoms. Therefore, she does not need to wait on Echo for low risk colonoscopy.   Disposition: Follow up in 1 year.   Medication Adjustments/Labs and Tests Ordered: Current medicines are reviewed at length with the patient today.  Concerns regarding medicines are outlined above.  Orders Placed This Encounter  Procedures   EKG  12-Lead   ECHOCARDIOGRAM COMPLETE   No orders of the defined types were placed in this encounter.   Patient Instructions  Medication Instructions:  Your physician recommends that you continue on your current medications as directed. Please refer to the Current Medication list given to you today.   *If you need a refill on your cardiac medications before your next appointment, please call your pharmacy*   Lab Work: NONE ordered at this time of appointment   If you have labs (blood work) drawn today and your tests are completely normal, you will receive your results only by: [place] have MyChart) OR A paper copy in the mail If you have any lab test that is abnormal or we need to change your treatment, we will call you to review the results.   Testing/Procedures: Your physician has requested that you have an echocardiogram. Echocardiography is a painless test that uses sound waves to create images of your heart. It provides your doctor with information about the size and shape of your heart and how well your heart's chambers and valves are working. This procedure takes approximately one hour. There are no restrictions for this procedure.    Follow-Up: At [facility] and your health needs are our priority.  As part of our continuing mission to provide you with exceptional heart care, we have created designated Provider Care Teams.  These Care Teams include your primary Cardiologist (physician) and Advanced  Practice Providers (APPs -  Physician Assistants and Nurse Practitioners) who all work together to provide you with the care you need, when you need it.  We recommend signing up for the patient portal called "MyChart".  Sign up information is provided on this After Visit Summary.  MyChart is used to connect with patients for Virtual Visits (Telemedicine).  Patients are able to view lab/test results, encounter notes, upcoming appointments, etc.  Non-urgent messages  can be sent to your provider as well.   To learn more about what you can do with MyChart, go to [email or web address].    Your next appointment:   1 year(s)  The format for your next appointment:   In Person  Provider:   Sande Rives, PA-C     or [name] [name]      Other Instructions   Important Information About Sugar         Signed, Darreld Mclean, PA-C  06/25/2022 6:32 PM    [place].

## 2022-06-16 ENCOUNTER — Other Ambulatory Visit: Payer: Medicaid Other

## 2022-06-16 DIAGNOSIS — E119 Type 2 diabetes mellitus without complications: Secondary | ICD-10-CM | POA: Diagnosis not present

## 2022-06-18 LAB — LIPID PANEL
Chol/HDL Ratio: 2.6 ratio (ref 0.0–4.4)
Cholesterol, Total: 125 mg/dL (ref 100–199)
HDL: 48 mg/dL (ref >39)
LDL Chol Calc (NIH): 65 mg/dL (ref 0–99)
Triglycerides: 55 mg/dL (ref 0–149)
VLDL Cholesterol Cal: 12 mg/dL (ref 5–40)

## 2022-06-18 LAB — HEMOGLOBIN A1C
Est. average glucose Bld gHb Est-mCnc: 148 mg/dL
Hgb A1c MFr Bld: 6.8 % — ABNORMAL HIGH (ref 4.8–5.6)

## 2022-06-18 NOTE — Progress Notes (Signed)
Internal Medicine Clinic Attending  I saw and evaluated the patient.  I personally confirmed the key portions of the history and exam documented by the resident  and I reviewed pertinent patient test results.  The assessment, diagnosis, and plan were formulated together and I agree with the documentation in the resident's note.  

## 2022-06-19 DIAGNOSIS — M79662 Pain in left lower leg: Secondary | ICD-10-CM | POA: Diagnosis not present

## 2022-06-19 DIAGNOSIS — M961 Postlaminectomy syndrome, not elsewhere classified: Secondary | ICD-10-CM | POA: Diagnosis not present

## 2022-06-19 DIAGNOSIS — G8929 Other chronic pain: Secondary | ICD-10-CM | POA: Diagnosis not present

## 2022-06-19 DIAGNOSIS — M25512 Pain in left shoulder: Secondary | ICD-10-CM | POA: Diagnosis not present

## 2022-06-19 DIAGNOSIS — M5136 Other intervertebral disc degeneration, lumbar region: Secondary | ICD-10-CM | POA: Diagnosis not present

## 2022-06-19 DIAGNOSIS — G894 Chronic pain syndrome: Secondary | ICD-10-CM | POA: Diagnosis not present

## 2022-06-20 NOTE — Telephone Encounter (Signed)
Discussed results of Lipid panel with patient on the phone today, reassured her all labs were normal.

## 2022-06-23 DIAGNOSIS — Z1211 Encounter for screening for malignant neoplasm of colon: Secondary | ICD-10-CM | POA: Diagnosis not present

## 2022-06-23 DIAGNOSIS — K5904 Chronic idiopathic constipation: Secondary | ICD-10-CM | POA: Diagnosis not present

## 2022-06-24 ENCOUNTER — Encounter: Payer: Self-pay | Admitting: Student

## 2022-06-24 DIAGNOSIS — I428 Other cardiomyopathies: Secondary | ICD-10-CM | POA: Insufficient documentation

## 2022-06-25 ENCOUNTER — Encounter: Payer: Self-pay | Admitting: Student

## 2022-06-25 ENCOUNTER — Ambulatory Visit (INDEPENDENT_AMBULATORY_CARE_PROVIDER_SITE_OTHER): Payer: Medicaid Other | Admitting: Student

## 2022-06-25 VITALS — BP 124/66 | HR 85 | Ht 68.0 in | Wt 199.6 lb

## 2022-06-25 DIAGNOSIS — R0789 Other chest pain: Secondary | ICD-10-CM

## 2022-06-25 DIAGNOSIS — I428 Other cardiomyopathies: Secondary | ICD-10-CM

## 2022-06-25 NOTE — Patient Instructions (Signed)
Medication Instructions:  Your physician recommends that you continue on your current medications as directed. Please refer to the Current Medication list given to you today.   *If you need a refill on your cardiac medications before your next appointment, please call your pharmacy*   Lab Work: NONE ordered at this time of appointment   If you have labs (blood work) drawn today and your tests are completely normal, you will receive your results only by: [place] (if you have MyChart) OR A paper copy in the mail If you have any lab test that is abnormal or we need to change your treatment, we will call you to review the results.   Testing/Procedures: Your physician has requested that you have an echocardiogram. Echocardiography is a painless test that uses sound waves to create images of your heart. It provides your doctor with information about the size and shape of your heart and how well your heart's chambers and valves are working. This procedure takes approximately one hour. There are no restrictions for this procedure.    Follow-Up: At [facility], you and your health needs are our priority.  As part of our continuing mission to provide you with exceptional heart care, we have created designated Provider Care Teams.  These Care Teams include your primary Cardiologist (physician) and Advanced Practice Providers (APPs -  Physician Assistants and Nurse Practitioners) who all work together to provide you with the care you need, when you need it.  We recommend signing up for the patient portal called "MyChart".  Sign up information is provided on this After Visit Summary.  MyChart is used to connect with patients for Virtual Visits (Telemedicine).  Patients are able to view lab/test results, encounter notes, upcoming appointments, etc.  Non-urgent messages can be sent to your provider as well.   To learn more about what you can do with MyChart, go to [email or web address].     Your next appointment:   1 year(s)  The format for your next appointment:   In Person  Provider:   Sande Rives, PA-C     or Dr. Stanford Breed      Other Instructions   Important Information About Sugar

## 2022-06-28 ENCOUNTER — Other Ambulatory Visit: Payer: Self-pay | Admitting: Student

## 2022-07-01 DIAGNOSIS — Z419 Encounter for procedure for purposes other than remedying health state, unspecified: Secondary | ICD-10-CM | POA: Diagnosis not present

## 2022-07-17 DIAGNOSIS — M5136 Other intervertebral disc degeneration, lumbar region: Secondary | ICD-10-CM | POA: Diagnosis not present

## 2022-07-17 DIAGNOSIS — M961 Postlaminectomy syndrome, not elsewhere classified: Secondary | ICD-10-CM | POA: Diagnosis not present

## 2022-07-17 DIAGNOSIS — M25512 Pain in left shoulder: Secondary | ICD-10-CM | POA: Diagnosis not present

## 2022-07-17 DIAGNOSIS — G894 Chronic pain syndrome: Secondary | ICD-10-CM | POA: Diagnosis not present

## 2022-07-17 DIAGNOSIS — M79662 Pain in left lower leg: Secondary | ICD-10-CM | POA: Diagnosis not present

## 2022-07-17 DIAGNOSIS — G8929 Other chronic pain: Secondary | ICD-10-CM | POA: Diagnosis not present

## 2022-07-25 ENCOUNTER — Other Ambulatory Visit: Payer: Self-pay | Admitting: Student

## 2022-07-29 NOTE — Progress Notes (Unsigned)
Patient: [name] [name] Date of Birth: March 04, 1966  Reason for Visit: Follow up History from: Patient Primary Neurologist: Dr. Jaynee Eagles   ASSESSMENT AND PLAN 56 y.o. year old female   1.  Neurosarcoidosis 2.  Chronic migraine headache -No more than 3 migraines a week  -Reorder Ajovy for migraine prevention -Reorder [name] to take at onset of acute headache -MRI of the brain with and without contrast March 2023 showed age-related findings -Remains on low dose methotrexate, Dr. Jannifer Franklin was prescribing, now from PCP, claims when tried to stop completely had worsening headaches  -Follow up with 6 months with Dr. Jaynee Eagles   IMPRESSION: This MRI of the brain with and without contrast shows the following: 1.   No acute findings.  2.   Two punctate T2/FLAIR hyperintense foci in the subcortical white matter of the frontal lobes.  This is a nonspecific finding most consistent with very minimal chronic microvascular ischemic change or minimal sequela of migraine headache.  The focus on the left was not present on the 2020 MRI 3.   Sequela of remote posterior fossa surgery, unchanged in appearance compared to the previous MRI. 4.   Normal enhancement pattern.   HISTORY  Dr. Jaynee Eagles 01/27/22: [name] [name] is an 56 y.o. female   She has 2 really bad nose bleeds and it was bad. Her nose scabs and having a lot of sinus issues. She has big scabs. Dr, Jannifer Franklin used to give her OTC steroid spray. She says she is stable with her headaches, she goes to pain management for the back, she had surgery on her back, her headaches can be severe, at least 20 headache days a months, they can be mild or range to severe. She has 8 severe headaches a month for > 1 year, she gets dizzy by looking, the light bother her, nausea, movement makes it worse, no vomiting, if she bends over it hurts, positional, she can wake with them in the morning and can last 24 hours. She takes oxycodone for the back pain. She tried Aimovig,  we discussed botox, discussed Ajovy. Vision changes. She had a partial hysterectomy.    MRI brain IMPRESSION: personally reviewed and agree (additional 5 minutes in addition to visit) 1. Stable postoperative changes in the posterior fossa. 2. No evidence of acute intracranial abnormality or active neurosarcoidosis. 3. Right maxillary sinus fluid and mucosal thickening, correlate for acute sinusitis.  Today July 30, 2022 SS: MRI of the brain with and without contrast in March 2023 showed age-related findings. Reports 3 migraines a week, is about normal for her. Never got the Ajovy or Ubrelvy. For headache, will take oxycodone, prescribed for back pain, it does help. Not interested in botox therapy, worries about needles. Not interested in sleep study, snores, had sleep consult with Dr. Rexene Alberts in 2022. We talked about methotrexate, Dr. Jannifer Franklin mentioned she could come off, has decided to stay on low dose. PCP refills now. Thinks she saw ENT, doesn't remember the conclusion, no more nosebleeds.   IMPRESSION: This MRI of the brain with and without contrast shows the following: 1.   No acute findings.  2.   Two punctate T2/FLAIR hyperintense foci in the subcortical white matter of the frontal lobes.  This is a nonspecific finding most consistent with very minimal chronic microvascular ischemic change or minimal sequela of migraine headache.  The focus on the left was not present on the 2020 MRI 3.   Sequela of remote posterior fossa surgery, unchanged in  appearance compared to the previous MRI. 4.   Normal enhancement pattern.    REVIEW OF SYSTEMS: Out of a complete 14 system review of symptoms, the patient complains only of the following symptoms, and all other reviewed systems are negative.  See HPI  ALLERGIES: Allergies  Allergen Reactions   Lisinopril Cough    HOME MEDICATIONS: Outpatient Medications Prior to Visit  Medication Sig Dispense Refill   ACCU-CHEK GUIDE test strip USE AS  INSTRUCTED TO CHECK BLOOD SUGAR UP TO 1 TIME A DAY 50 strip 7   albuterol (VENTOLIN HFA) 108 (90 Base) MCG/ACT inhaler Inhale 2 puffs into the lungs every 6 (six) hours as needed for wheezing or shortness of breath. 18 g 1   amLODipine (NORVASC) 5 MG tablet Take 1 tablet by mouth daily.     aspirin 81 MG chewable tablet Chew 81 mg by mouth daily.     atorvastatin (LIPITOR) 20 MG tablet TAKE 1 TABLET BY MOUTH EVERY DAY 90 tablet 3   B-D ULTRAFINE III SHORT PEN 31G X 8 MM MISC USE TO INJECT INSULIN 1 TIME DAILY DIAG CODE E11.9 INSULIN DEPENDENT 100 each 4   blood glucose meter kit and supplies KIT Dispense based on patient and insurance preference. Use up to four times daily as directed. (FOR ICD-9 250.00, 250.01). 1 each 0   carvedilol (COREG) 6.25 MG tablet TAKE 1 TABLET BY MOUTH 2 TIMES DAILY WITH A MEAL. 30 tablet 2   cycloSPORINE (RESTASIS) 0.05 % ophthalmic emulsion Place 1 drop into both eyes 2 (two) times daily.     Dulaglutide (TRULICITY) 6.06 TK/1.6WF SOPN Inject 1.5 mg into the skin once a week. 6 mL 3   fluticasone (FLONASE) 50 MCG/ACT nasal spray SPRAY 1 SPRAY INTO BOTH NOSTRILS DAILY. 16 mL 5   folic acid (FOLVITE) 1 MG tablet TAKE 1 TABLET BY MOUTH EVERY DAY 90 tablet 3   JARDIANCE 10 MG TABS tablet TAKE 1 TABLET BY MOUTH EVERY DAY 90 tablet 3   levothyroxine (SYNTHROID) 137 MCG tablet TAKE 1 TABLET BY MOUTH EVERY DAY BEFORE BREAKFAST 90 tablet 1   losartan (COZAAR) 50 MG tablet TAKE 1 TABLET BY MOUTH EVERY DAY 90 tablet 3   metFORMIN (GLUCOPHAGE) 1000 MG tablet Take 1 tablet (1,000 mg total) by mouth 2 (two) times daily with a meal. 180 tablet 1   methocarbamol (ROBAXIN-750) 750 MG tablet Take 1 tablet (750 mg total) by mouth every 8 (eight) hours as needed for muscle spasms. 12 tablet 0   methotrexate (RHEUMATREX) 2.5 MG tablet TAKE 1 TABLET (2.5 MG TOTAL) BY MOUTH ONCE A WEEK. CAUTION:CHEMOTHERAPY. PROTECT FROM LIGHT. 12 tablet 2   Multiple Vitamin (MULTIVITAMIN WITH MINERALS) TABS  tablet Take 1 tablet by mouth every evening.      ondansetron (ZOFRAN-ODT) 4 MG disintegrating tablet Take by mouth.     oxyCODONE (ROXICODONE) 15 MG immediate release tablet Take 30 mg by mouth every 6 (six) hours.     pantoprazole (PROTONIX) 20 MG tablet Take 1 tablet (20 mg total) by mouth daily. 90 tablet 1   polyethylene glycol (COLYTE) 240 g solution Drink 8 ounces every 10 minutes until the entire solution is consumed 4000 mL 0   Ubrogepant (UBRELVY) 100 MG TABS Take 100 mg by mouth every 2 (two) hours as needed. Maximum 259m a day. 16 tablet 11   No facility-administered medications prior to visit.    PAST MEDICAL HISTORY: Past Medical History:  Diagnosis Date   Abdominal pain 06/25/2010  CT abd/pelvis 05/2014: Very large amount of stool noted distending the colon. The size consistent with severe constipation/fecal impaction.     Abnormal cervical Papanicolaou smear 04/17/2017   Anemia    Arthritis    lumbar region- radiculopathy   Atypical chest pain    Cancer ([place]) 2013    Thyroid Ca-thyroidectomy   Chronic fatigue    DM (diabetes mellitus) (HCC)    Gait disturbance    GERD (gastroesophageal reflux disease)    Headache(784.0)    Herniated lumbar disc without myelopathy 08/20/2016   History of osteoporosis 05/25/2007   DEXA 2014 Since the baseline examination in 2002, there has been a  statistically significant 19.5% increase in bone mineral density in  the lumbar spine and a statistically significant 14.6% increase in  bone mineral density in the left femur.   DEXA 2002 with osteoporosis of lumbar spine and moderate osteopenia of left hip MRI 2002 left hip negative for avascular necrosis  Qualifier: Diagnosis o   HTN (hypertension)    Hyperlipidemia    Hypothyroidism    Insomnia 01/02/2015   Intermittent left-sided chest pain 01/26/2013   Left sided numbness 12/10/2018   Nephrolithiasis    Nonischemic cardiomyopathy (HCC)    Obesity    OSA (obstructive sleep apnea)     last test- many yrs. ago, done at [facility], no CPAP in use   Osteoporosis    Pain and swelling of lower leg, left 12/16/2017   Peripheral neuropathy    Prolonged QT interval    Rectocele 07/23/2020   [facility]   Sarcoidosis    sarcoidosis in Brain, treated with surgery    PAST SURGICAL HISTORY: Past Surgical History:  Procedure Laterality Date   BRAIN SURGERY  2001   for sarcoidosis   BREAST BIOPSY Left    BREAST BIOPSY Right    BREAST EXCISIONAL BIOPSY Right    x2 benign   BREAST EXCISIONAL BIOPSY Left    benign   BREAST SURGERY Bilateral    biopsies    DILATION AND CURETTAGE OF UTERUS     HYSTEROSCOPY     LUMBAR LAMINECTOMY/DECOMPRESSION MICRODISCECTOMY Left 08/20/2016   Procedure: Left Lumbar four-five Microdiskectomy;  Surgeon: Leeroy Cha, MD;  Location: [facility];  Service: Neurosurgery;  Laterality: Left;   PARTIAL HYSTERECTOMY  2013   ROTATOR CUFF REPAIR Right    07/2013   Suboccipital craniotomy     THYROIDECTOMY     Precancerous lesion    FAMILY HISTORY: Family History  Problem Relation Age of Onset   Heart disease Mother        questionable CAD and arrythmias    Cancer Mother        Breast cancer   Breast cancer Mother 30   Venous thrombosis Sister    Diabetes Sister    Heart disease Sister    Diabetes Brother    Heart disease Brother    Cancer Maternal Aunt    Breast cancer Maternal Aunt    Venous thrombosis Sister    Diabetes Sister    Heart disease Sister     SOCIAL HISTORY: Social History   Socioeconomic History   Marital status: Married    Spouse name: Neill Loft   Number of children: 4   Years of education: 12   Highest education level: Not on file  Occupational History    Employer: UNEMPLOYED  Tobacco Use   Smoking status: Never   Smokeless tobacco: Never  Vaping Use   Vaping Use: Never used  Substance and Sexual Activity   Alcohol use: No    Alcohol/week: 0.0 standard drinks of alcohol   Drug use: No   Sexual activity:  Not Currently    Birth control/protection: Surgical    Comment: hysterectomy 2013  Other Topics Concern   Not on file  Social History Narrative   Lives in [place] alone    Drinks very little caffeine   Patient is right handed.    Social Determinants of Health   Financial Resource Strain: Not on file  Food Insecurity: Food Insecurity Present (05/13/2021)   Hunger Vital Sign    Worried About Running Out of Food in the Last Year: Sometimes true    Ran Out of Food in the Last Year: Sometimes true  Transportation Needs: No Transportation Needs (05/13/2021)   PRAPARE - Hydrologist (Medical): No    Lack of Transportation (Non-Medical): No  Physical Activity: Not on file  Stress: Not on file  Social Connections: Not on file  Intimate Partner Violence: Not on file    PHYSICAL EXAM  Vitals:   07/30/22 1055  BP: 133/81  Pulse: 91  Weight: 199 lb 8 oz (90.5 kg)  Height: 5' 8"  (1.727 m)   Body mass index is 30.33 kg/m.  Generalized: Well developed, in no acute distress  Neurological examination  Mentation: Alert oriented to time, place, history taking. Follows all commands speech and language fluent Cranial nerve II-XII: Pupils were equal round reactive to light. Extraocular movements were full, visual field were full on confrontational test. Facial sensation and strength were normal. Head turning and shoulder shrug  were normal and symmetric. Motor: The motor testing reveals 5 over 5 strength of all 4 extremities. Good symmetric motor tone is noted throughout.  Sensory: Sensory testing is intact to soft touch on all 4 extremities. No evidence of extinction is noted.  Coordination: Cerebellar testing reveals good finger-nose-finger and heel-to-shin bilaterally.  Gait and station: Gait is normal. Tandem gait is slightly unsteady. Reflexes: Deep tendon reflexes are symmetric and normal bilaterally.   DIAGNOSTIC DATA (LABS, IMAGING, TESTING) - I  reviewed patient records, labs, notes, testing and imaging myself where available.  Lab Results  Component Value Date   WBC 5.7 01/27/2022   HGB 12.8 01/27/2022   HCT 40.2 01/27/2022   MCV 85 01/27/2022   PLT 319 01/27/2022      Component Value Date/Time   NA 142 05/02/2022 1148   K 5.0 05/02/2022 1148   CL 101 05/02/2022 1148   CO2 24 05/02/2022 1148   GLUCOSE 134 (H) 05/02/2022 1148   GLUCOSE 113 (H) 06/07/2021 2122   BUN 19 05/02/2022 1148   CREATININE 0.76 05/02/2022 1148   CREATININE 0.84 06/29/2014 1221   CALCIUM 9.5 05/02/2022 1148   PROT 7.0 05/02/2022 1148   ALBUMIN 4.3 05/02/2022 1148   AST 17 05/02/2022 1148   ALT 20 05/02/2022 1148   ALKPHOS 81 05/02/2022 1148   BILITOT <0.2 05/02/2022 1148   GFRNONAA >60 06/07/2021 2122   GFRNONAA 84 06/29/2014 1221   GFRAA 101 05/09/2020 0941   GFRAA >89 06/29/2014 1221   Lab Results  Component Value Date   CHOL 125 06/16/2022   HDL 48 06/16/2022   LDLCALC 65 06/16/2022   TRIG 55 06/16/2022   CHOLHDL 2.6 06/16/2022   Lab Results  Component Value Date   HGBA1C 6.8 (H) 06/16/2022   Lab Results  Component Value Date   VITAMINB12 793 10/29/2006   Lab  Results  Component Value Date   TSH 1.090 05/02/2022    Butler Denmark, AGNP-C, DNP 07/30/2022, 11:42 AM [facility] [address], [place], [place] [address] [phone]

## 2022-07-30 ENCOUNTER — Ambulatory Visit: Payer: Medicaid Other | Admitting: Neurology

## 2022-07-30 ENCOUNTER — Other Ambulatory Visit: Payer: Self-pay | Admitting: Neurology

## 2022-07-30 ENCOUNTER — Encounter: Payer: Self-pay | Admitting: Neurology

## 2022-07-30 DIAGNOSIS — G43709 Chronic migraine without aura, not intractable, without status migrainosus: Secondary | ICD-10-CM

## 2022-07-30 MED ORDER — UBRELVY 100 MG PO TABS: 100.0000 mg | ORAL_TABLET | ORAL | 11 refills | Status: DC | PRN

## 2022-07-30 MED ORDER — AJOVY 225 MG/1.5ML ~~LOC~~ SOAJ
225.0000 mg | SUBCUTANEOUS | 11 refills | Status: DC
Start: 2022-07-30 — End: 2023-01-26

## 2022-07-30 NOTE — Patient Instructions (Signed)
Start taking the Ajovy for headache prevention  Take Ubrelvy at onset of headache, may repeat in 2 hours if needed

## 2022-07-31 ENCOUNTER — Other Ambulatory Visit: Payer: Self-pay | Admitting: Neurology

## 2022-08-01 DIAGNOSIS — Z419 Encounter for procedure for purposes other than remedying health state, unspecified: Secondary | ICD-10-CM | POA: Diagnosis not present

## 2022-08-04 ENCOUNTER — Other Ambulatory Visit: Payer: Self-pay | Admitting: Internal Medicine

## 2022-08-04 DIAGNOSIS — E119 Type 2 diabetes mellitus without complications: Secondary | ICD-10-CM

## 2022-08-05 ENCOUNTER — Telehealth: Payer: Self-pay

## 2022-08-05 ENCOUNTER — Other Ambulatory Visit: Payer: Self-pay | Admitting: Internal Medicine

## 2022-08-05 NOTE — Telephone Encounter (Signed)
Key: [id number] - PA Case ID: [id number] - Rx #: [id number] PA Status pending   Drug Ubrelvy '100MG'$  tablets

## 2022-08-06 ENCOUNTER — Ambulatory Visit (HOSPITAL_COMMUNITY): Payer: Medicaid Other | Attending: Cardiology

## 2022-08-06 DIAGNOSIS — I255 Ischemic cardiomyopathy: Secondary | ICD-10-CM | POA: Diagnosis not present

## 2022-08-06 DIAGNOSIS — I428 Other cardiomyopathies: Secondary | ICD-10-CM | POA: Diagnosis not present

## 2022-08-06 DIAGNOSIS — R0789 Other chest pain: Secondary | ICD-10-CM | POA: Insufficient documentation

## 2022-08-06 LAB — ECHOCARDIOGRAM COMPLETE
Area-P 1/2: 3.95 cm2
S' Lateral: 3.2 cm

## 2022-08-07 ENCOUNTER — Telehealth: Payer: Self-pay | Admitting: Student

## 2022-08-07 NOTE — Telephone Encounter (Signed)
"  There is some mild stiffening of the heart but this is unchanged from prior Echo in 2020"  Pt had a question about this part of the VM left about her echocardiogram and wanted to ask what that means

## 2022-08-07 NOTE — Telephone Encounter (Signed)
Returned call to pt, explained question. All other questions answered.

## 2022-08-15 DIAGNOSIS — M961 Postlaminectomy syndrome, not elsewhere classified: Secondary | ICD-10-CM | POA: Diagnosis not present

## 2022-08-15 DIAGNOSIS — M79662 Pain in left lower leg: Secondary | ICD-10-CM | POA: Diagnosis not present

## 2022-08-15 DIAGNOSIS — M5136 Other intervertebral disc degeneration, lumbar region: Secondary | ICD-10-CM | POA: Diagnosis not present

## 2022-08-15 DIAGNOSIS — G8929 Other chronic pain: Secondary | ICD-10-CM | POA: Diagnosis not present

## 2022-08-15 DIAGNOSIS — G894 Chronic pain syndrome: Secondary | ICD-10-CM | POA: Diagnosis not present

## 2022-08-15 DIAGNOSIS — M25512 Pain in left shoulder: Secondary | ICD-10-CM | POA: Diagnosis not present

## 2022-08-31 DIAGNOSIS — Z419 Encounter for procedure for purposes other than remedying health state, unspecified: Secondary | ICD-10-CM | POA: Diagnosis not present

## 2022-09-02 ENCOUNTER — Other Ambulatory Visit: Payer: Self-pay | Admitting: Internal Medicine

## 2022-09-02 DIAGNOSIS — K6389 Other specified diseases of intestine: Secondary | ICD-10-CM | POA: Diagnosis not present

## 2022-09-02 DIAGNOSIS — E1142 Type 2 diabetes mellitus with diabetic polyneuropathy: Secondary | ICD-10-CM | POA: Diagnosis not present

## 2022-09-02 DIAGNOSIS — G4700 Insomnia, unspecified: Secondary | ICD-10-CM | POA: Diagnosis not present

## 2022-09-02 DIAGNOSIS — Z8521 Personal history of malignant neoplasm of larynx: Secondary | ICD-10-CM | POA: Diagnosis not present

## 2022-09-02 DIAGNOSIS — Z1211 Encounter for screening for malignant neoplasm of colon: Secondary | ICD-10-CM | POA: Diagnosis not present

## 2022-09-02 DIAGNOSIS — Z90710 Acquired absence of both cervix and uterus: Secondary | ICD-10-CM | POA: Diagnosis not present

## 2022-09-02 DIAGNOSIS — I10 Essential (primary) hypertension: Secondary | ICD-10-CM | POA: Diagnosis not present

## 2022-09-02 DIAGNOSIS — Q438 Other specified congenital malformations of intestine: Secondary | ICD-10-CM | POA: Diagnosis not present

## 2022-09-02 DIAGNOSIS — E079 Disorder of thyroid, unspecified: Secondary | ICD-10-CM | POA: Diagnosis not present

## 2022-09-02 DIAGNOSIS — K219 Gastro-esophageal reflux disease without esophagitis: Secondary | ICD-10-CM | POA: Diagnosis not present

## 2022-09-02 DIAGNOSIS — Z9889 Other specified postprocedural states: Secondary | ICD-10-CM | POA: Diagnosis not present

## 2022-09-02 DIAGNOSIS — G4733 Obstructive sleep apnea (adult) (pediatric): Secondary | ICD-10-CM | POA: Diagnosis not present

## 2022-09-02 DIAGNOSIS — M1990 Unspecified osteoarthritis, unspecified site: Secondary | ICD-10-CM | POA: Diagnosis not present

## 2022-09-02 DIAGNOSIS — Z794 Long term (current) use of insulin: Secondary | ICD-10-CM | POA: Diagnosis not present

## 2022-09-12 DIAGNOSIS — G894 Chronic pain syndrome: Secondary | ICD-10-CM | POA: Diagnosis not present

## 2022-09-12 DIAGNOSIS — M961 Postlaminectomy syndrome, not elsewhere classified: Secondary | ICD-10-CM | POA: Diagnosis not present

## 2022-09-12 DIAGNOSIS — M25512 Pain in left shoulder: Secondary | ICD-10-CM | POA: Diagnosis not present

## 2022-09-12 DIAGNOSIS — M5136 Other intervertebral disc degeneration, lumbar region: Secondary | ICD-10-CM | POA: Diagnosis not present

## 2022-09-12 DIAGNOSIS — G8929 Other chronic pain: Secondary | ICD-10-CM | POA: Diagnosis not present

## 2022-09-12 DIAGNOSIS — M79662 Pain in left lower leg: Secondary | ICD-10-CM | POA: Diagnosis not present

## 2022-09-20 ENCOUNTER — Other Ambulatory Visit: Payer: Self-pay | Admitting: Internal Medicine

## 2022-09-22 ENCOUNTER — Encounter: Payer: Self-pay | Admitting: *Deleted

## 2022-10-01 DIAGNOSIS — Z419 Encounter for procedure for purposes other than remedying health state, unspecified: Secondary | ICD-10-CM | POA: Diagnosis not present

## 2022-10-04 ENCOUNTER — Other Ambulatory Visit: Payer: Self-pay | Admitting: Internal Medicine

## 2022-10-10 DIAGNOSIS — M961 Postlaminectomy syndrome, not elsewhere classified: Secondary | ICD-10-CM | POA: Diagnosis not present

## 2022-10-10 DIAGNOSIS — M5136 Other intervertebral disc degeneration, lumbar region: Secondary | ICD-10-CM | POA: Diagnosis not present

## 2022-10-10 DIAGNOSIS — G894 Chronic pain syndrome: Secondary | ICD-10-CM | POA: Diagnosis not present

## 2022-10-10 DIAGNOSIS — M79662 Pain in left lower leg: Secondary | ICD-10-CM | POA: Diagnosis not present

## 2022-10-10 DIAGNOSIS — G8929 Other chronic pain: Secondary | ICD-10-CM | POA: Diagnosis not present

## 2022-10-10 DIAGNOSIS — M25512 Pain in left shoulder: Secondary | ICD-10-CM | POA: Diagnosis not present

## 2022-10-15 ENCOUNTER — Other Ambulatory Visit: Payer: Self-pay | Admitting: Student

## 2022-10-15 NOTE — Telephone Encounter (Signed)
Call to pharmacy-has refills.

## 2022-10-31 DIAGNOSIS — Z419 Encounter for procedure for purposes other than remedying health state, unspecified: Secondary | ICD-10-CM | POA: Diagnosis not present

## 2022-11-06 ENCOUNTER — Other Ambulatory Visit: Payer: Self-pay | Admitting: Internal Medicine

## 2022-11-06 DIAGNOSIS — E119 Type 2 diabetes mellitus without complications: Secondary | ICD-10-CM

## 2022-11-07 DIAGNOSIS — G8929 Other chronic pain: Secondary | ICD-10-CM | POA: Diagnosis not present

## 2022-11-07 DIAGNOSIS — M5136 Other intervertebral disc degeneration, lumbar region: Secondary | ICD-10-CM | POA: Diagnosis not present

## 2022-11-07 DIAGNOSIS — M79662 Pain in left lower leg: Secondary | ICD-10-CM | POA: Diagnosis not present

## 2022-11-07 DIAGNOSIS — G894 Chronic pain syndrome: Secondary | ICD-10-CM | POA: Diagnosis not present

## 2022-11-07 DIAGNOSIS — M961 Postlaminectomy syndrome, not elsewhere classified: Secondary | ICD-10-CM | POA: Diagnosis not present

## 2022-11-07 DIAGNOSIS — M25512 Pain in left shoulder: Secondary | ICD-10-CM | POA: Diagnosis not present

## 2022-12-01 DIAGNOSIS — Z419 Encounter for procedure for purposes other than remedying health state, unspecified: Secondary | ICD-10-CM | POA: Diagnosis not present

## 2022-12-09 ENCOUNTER — Other Ambulatory Visit: Payer: Self-pay | Admitting: Student

## 2022-12-09 ENCOUNTER — Other Ambulatory Visit: Payer: Self-pay

## 2022-12-09 ENCOUNTER — Ambulatory Visit: Payer: Medicaid Other

## 2022-12-09 VITALS — BP 121/70 | HR 89 | Temp 98.2°F | Ht 68.0 in | Wt 199.7 lb

## 2022-12-09 DIAGNOSIS — K5902 Outlet dysfunction constipation: Secondary | ICD-10-CM | POA: Diagnosis not present

## 2022-12-09 DIAGNOSIS — Z7984 Long term (current) use of oral hypoglycemic drugs: Secondary | ICD-10-CM | POA: Diagnosis not present

## 2022-12-09 DIAGNOSIS — N816 Rectocele: Secondary | ICD-10-CM

## 2022-12-09 DIAGNOSIS — Z23 Encounter for immunization: Secondary | ICD-10-CM

## 2022-12-09 DIAGNOSIS — E119 Type 2 diabetes mellitus without complications: Secondary | ICD-10-CM

## 2022-12-09 LAB — POCT GLYCOSYLATED HEMOGLOBIN (HGB A1C): Hemoglobin A1C: 6.2 % — AB (ref 4.0–5.6)

## 2022-12-09 LAB — GLUCOSE, CAPILLARY: Glucose-Capillary: 92 mg/dL (ref 70–99)

## 2022-12-09 NOTE — Progress Notes (Signed)
CC: abdominal pain, constipation  HPI:  [name] [name] is a 57 y.o. with past medical history as below who presents for f/u of her chronic constipation.  See detailed assessment and plan for HPI.  Past Medical History:  Diagnosis Date   Abdominal pain 06/25/2010   CT abd/pelvis 05/2014: Very large amount of stool noted distending the colon. The size consistent with severe constipation/fecal impaction.     Abnormal cervical Papanicolaou smear 04/17/2017   Anemia    Arthritis    lumbar region- radiculopathy   Atypical chest pain    Cancer ([place]) 2013    Thyroid Ca-thyroidectomy   Chronic fatigue    DM (diabetes mellitus) (HCC)    Gait disturbance    GERD (gastroesophageal reflux disease)    Headache(784.0)    Herniated lumbar disc without myelopathy 08/20/2016   History of osteoporosis 05/25/2007   DEXA 2014 Since the baseline examination in 2002, there has been a  statistically significant 19.5% increase in bone mineral density in  the lumbar spine and a statistically significant 14.6% increase in  bone mineral density in the left femur.   DEXA 2002 with osteoporosis of lumbar spine and moderate osteopenia of left hip MRI 2002 left hip negative for avascular necrosis  Qualifier: Diagnosis o   HTN (hypertension)    Hyperlipidemia    Hypothyroidism    Insomnia 01/02/2015   Intermittent left-sided chest pain 01/26/2013   Left sided numbness 12/10/2018   Nephrolithiasis    Nonischemic cardiomyopathy (HCC)    Obesity    OSA (obstructive sleep apnea)    last test- many yrs. 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  Physical Exam:  Vitals:   12/09/22 1018  BP: 121/70  Pulse: 89  Temp: 98.2 F (36.8  C)  TempSrc: Oral  SpO2: 99%  Weight: 199 lb 11.2 oz (90.6 kg)  Height: '5\' 8"'$  (1.727 m)   Physical Exam Constitutional:      General: She is not in acute distress. HENT:     Head: Normocephalic and atraumatic.  Cardiovascular:     Rate and Rhythm: Normal rate and regular rhythm.  Pulmonary:     Effort: Pulmonary effort is normal.  Abdominal:     General: There is no distension.     Palpations: Abdomen is soft.     Tenderness: There is generalized abdominal tenderness.  Skin:    General: Skin is warm and dry.  Neurological:     Mental Status: She is alert and oriented to person, place, and time.  Psychiatric:        Mood and Affect: Mood normal.        Behavior: Behavior normal.      Assessment & Plan:   See Encounters Tab for problem based charting.  Controlled diabetes mellitus type 2, ([place]) Current medications: metformin '1000mg'$  BID, jardiance '10mg'$  daily, trulicity 0.'75mg'$  weekly. Last A1c 6.8. Repeat today 6.2. Patient denies polyuria, polydipsia. She does have severe GI issues but I do not feel trulicity contributes to symptoms as her GI issues are most likely due to structural defect. -continue current regimen -return in 3 months for repeat A1c  Rectocele  Patient continues with abdominal pain related to her chronic constipation. Currently only taking metamucil. Has 2-3 bowel movements a week. She has many other possible contributors to her symptoms, most notably her oxycodone. She additionally has DMII which has been relatively well controlled. Defecogram and anal manometry previously demonstrated puborectalis paradoxus, pelvic dyssynergia, and anterior rectocele (2 cm) with significant postevacuation residual. She was referred to urogynecology but this never materialized. She did attend pelvic floor therapy but didn't feel this helped her symptoms. Of note, recent colonoscopy was normal. -will have patient schedule appointment with urogynecology during visit  today -could consider another round of pelvic floor therapy and/or colorectal surgery referral in the future.    Patient discussed with Dr. Heber [place]

## 2022-12-09 NOTE — Assessment & Plan Note (Deleted)
Patient continues with abdominal pain related to her chronic constipation. Currently only taking metamucil. She has many other possible contributors to her symptoms, most notably her oxycodone. She additionally has DMII which has been relatively well controlled. Defecogram and anal manometry previously demonstrated

## 2022-12-09 NOTE — Assessment & Plan Note (Addendum)
Patient continues with abdominal pain related to her chronic constipation. Currently only taking metamucil. Has 2-3 bowel movements a week. She has many other possible contributors to her symptoms, most notably her oxycodone. She additionally has DMII which has been relatively well controlled. Defecogram and anal manometry previously demonstrated puborectalis paradoxus, pelvic dyssynergia, and anterior rectocele (2 cm) with significant postevacuation residual. She was referred to urogynecology but this never materialized. She did attend pelvic floor therapy but didn't feel this helped her symptoms. Of note, recent colonoscopy was normal. -will have patient schedule appointment with urogynecology during visit today -could consider another round of pelvic floor therapy and/or colorectal surgery referral in the future.

## 2022-12-09 NOTE — Addendum Note (Signed)
Addended by: Linward Natal on: 12/09/2022 11:29 AM   Modules accepted: Orders

## 2022-12-09 NOTE — Assessment & Plan Note (Addendum)
Current medications: metformin '1000mg'$  BID, jardiance '10mg'$  daily, trulicity 0.'75mg'$  weekly. Last A1c 6.8. Repeat today 6.2. Patient denies polyuria, polydipsia. She does have severe GI issues but I do not feel trulicity contributes to symptoms as her GI issues are most likely due to structural defect. -continue current regimen -return in 3 months for repeat A1c

## 2022-12-09 NOTE — Patient Instructions (Signed)
[name], it was a pleasure seeing you today!  Today we discussed: Constipation/abdominal pain- please follow up with urogynecology Diabetes- continue current medications. Will call with A1c results.  I have ordered the following labs today:  Lab Orders         Glucose, capillary         POC Hbg A1C      Tests ordered today:  none  Referrals ordered today:   Referral Orders  No referral(s) requested today     I have ordered the following medication/changed the following medications:   Stop the following medications: There are no discontinued medications.   Start the following medications: No orders of the defined types were placed in this encounter.    Follow-up: 3 months   Please make sure to arrive 15 minutes prior to your next appointment. If you arrive late, you may be asked to reschedule.   We look forward to seeing you next time. Please call our clinic at [phone] if you have any questions or concerns. The best time to call is Monday-Friday from 9am-4pm, but there is someone available 24/7. If after hours or the weekend, call the main hospital number and ask for the Internal Medicine Resident On-Call. If you need medication refills, please notify your pharmacy one week in advance and they will send [name] a request.  Thank you for letting us take part in your care. Wishing you the best!  Thank you, Linward Natal, MD

## 2022-12-11 DIAGNOSIS — M25512 Pain in left shoulder: Secondary | ICD-10-CM | POA: Diagnosis not present

## 2022-12-11 DIAGNOSIS — M79662 Pain in left lower leg: Secondary | ICD-10-CM | POA: Diagnosis not present

## 2022-12-11 DIAGNOSIS — G894 Chronic pain syndrome: Secondary | ICD-10-CM | POA: Diagnosis not present

## 2022-12-11 DIAGNOSIS — M961 Postlaminectomy syndrome, not elsewhere classified: Secondary | ICD-10-CM | POA: Diagnosis not present

## 2022-12-11 DIAGNOSIS — G8929 Other chronic pain: Secondary | ICD-10-CM | POA: Diagnosis not present

## 2022-12-11 DIAGNOSIS — M5136 Other intervertebral disc degeneration, lumbar region: Secondary | ICD-10-CM | POA: Diagnosis not present

## 2022-12-11 NOTE — Progress Notes (Signed)
Internal Medicine Clinic Attending  Case discussed with the resident at the time of the visit.  We reviewed the resident's history and exam and pertinent patient test results.  I agree with the assessment, diagnosis, and plan of care documented in the resident's note.  

## 2022-12-29 ENCOUNTER — Telehealth: Payer: Self-pay

## 2022-12-29 NOTE — Telephone Encounter (Signed)
..  Medicaid Managed Care   Unsuccessful Outreach Note  12/29/2022 Name: [name] [name] MRN: [id number] DOB: 1966-02-27  Referred by: Angelique Blonder, DO Reason for referral : Appointment (I called the patient today to get her scheduled with the MM Team at the request of her insurance plan. I left my name and number on her VM. )   An unsuccessful telephone outreach was attempted today. The patient was referred to the case management team for assistance with care management and care coordination.   Follow Up Plan: The care management team will reach out to the patient again over the next 5 days.   [place]

## 2022-12-30 ENCOUNTER — Ambulatory Visit (INDEPENDENT_AMBULATORY_CARE_PROVIDER_SITE_OTHER): Payer: Medicaid Other | Admitting: Obstetrics and Gynecology

## 2022-12-30 ENCOUNTER — Encounter: Payer: Self-pay | Admitting: Obstetrics and Gynecology

## 2022-12-30 VITALS — BP 116/79 | HR 101 | Ht 68.11 in | Wt 199.0 lb

## 2022-12-30 DIAGNOSIS — R350 Frequency of micturition: Secondary | ICD-10-CM | POA: Diagnosis not present

## 2022-12-30 DIAGNOSIS — M62838 Other muscle spasm: Secondary | ICD-10-CM | POA: Diagnosis not present

## 2022-12-30 DIAGNOSIS — N816 Rectocele: Secondary | ICD-10-CM | POA: Diagnosis not present

## 2022-12-30 DIAGNOSIS — K5904 Chronic idiopathic constipation: Secondary | ICD-10-CM

## 2022-12-30 DIAGNOSIS — K5902 Outlet dysfunction constipation: Secondary | ICD-10-CM | POA: Diagnosis not present

## 2022-12-30 LAB — POCT URINALYSIS DIPSTICK
Bilirubin, UA: NEGATIVE
Blood, UA: NEGATIVE
Glucose, UA: POSITIVE — AB
Ketones, UA: NEGATIVE
Leukocytes, UA: NEGATIVE
Nitrite, UA: NEGATIVE
Protein, UA: NEGATIVE
Spec Grav, UA: 1.02 (ref 1.010–1.025)
Urobilinogen, UA: 0.2 E.U./dL
pH, UA: 6 (ref 5.0–8.0)

## 2022-12-30 MED ORDER — CYCLOBENZAPRINE HCL 5 MG PO TABS: 5.0000 mg | ORAL_TABLET | Freq: Three times a day (TID) | ORAL | 1 refills | Status: DC | PRN

## 2022-12-30 NOTE — Progress Notes (Signed)
[facility] New Patient Evaluation and Consultation  Referring Provider: Angelique Blonder, DO PCP: Angelique Blonder, DO Date of Service: 12/30/2022  SUBJECTIVE Chief Complaint: New Patient (Initial Visit) [name] [name] is a 57 y.o. female here for a consult for a prolapse./)  History of Present Illness: [name] [name] is a 57 y.o. Black or African-American female seen in consultation at the request of Dr. Markus Jarvis for evaluation of prolapse.    Review of records significant for: Has chronic constipation, taking metamucil. Has 2-3 bowel movements a week. Defecogram and anal manometry previously demonstrated puborectalis paradoxus, pelvic dyssynergia, and anterior rectocele (2 cm) with significant postevacuation residual.   Urinary Symptoms: Does not leak urine.   Day time voids 4-6.  Nocturia: 1-3 times per night to void. Voiding dysfunction: she empties her bladder well.  does not use a catheter to empty bladder.  When urinating, she feels dribbling after finishing   UTIs:  0  UTI's in the last year.   Reports history of kidney or bladder stones  Pelvic Organ Prolapse Symptoms:                  She Admits to a feeling of a bulge the vaginal area. It has been present for years.  She Denies seeing a bulge.  This bulge is bothersome. Can feel "balls of poop" stuck in the vaginal area.   Bowel Symptom: Bowel movements: 2-4 time(s) per week. Has gone up to two weeks without a bowel movement.  Stool consistency: hard Straining: yes.  Splinting: yes.  Incomplete evacuation: yes.  Denies accidental bowel leakage / fecal incontinence Bowel regimen: diet, metamucil gummies. Has tried miralax in the past but not taking regularly.  Last colonoscopy: Date- Nov 2023. Negative Has done pelvic PT but did not see a lot of improvement but did feel it helped with relaxation.  Takes oxycodone for the last year due to back pain and headaches.   Sexual Function Sexually active:  yes.  Pain with sex: Yes, deep in the pelvis, has discomfort due to prolapse  Pelvic Pain Admits to pelvic pain Location: pelvis and back Pain occurs: periodically, with constipation Prior pain treatment: pain meds Worsened by: sex, cold   Past Medical History:  Past Medical History:  Diagnosis Date   Abdominal pain 06/25/2010   CT abd/pelvis 05/2014: Very large amount of stool noted distending the colon. The size consistent with severe constipation/fecal impaction.     Abnormal cervical Papanicolaou smear 04/17/2017   Anemia    Arthritis    lumbar region- radiculopathy   Atypical chest pain    Cancer ([place]) 2013    Thyroid Ca-thyroidectomy   Chronic fatigue    DM (diabetes mellitus) (HCC)    Gait disturbance    GERD (gastroesophageal reflux disease)    Headache(784.0)    Herniated lumbar disc without myelopathy 08/20/2016   History of osteoporosis 05/25/2007   DEXA 2014 Since the baseline examination in 2002, there has been a  statistically significant 19.5% increase in bone mineral density in  the lumbar spine and a statistically significant 14.6% increase in  bone mineral density in the left femur.   DEXA 2002 with osteoporosis of lumbar spine and moderate osteopenia of left hip MRI 2002 left hip negative for avascular necrosis  Qualifier: Diagnosis o   HTN (hypertension)    Hyperlipidemia    Hypothyroidism    Insomnia 01/02/2015   Intermittent left-sided chest pain 01/26/2013   Left sided numbness 12/10/2018  Nephrolithiasis    Nonischemic cardiomyopathy (HCC)    Obesity    OSA (obstructive sleep apnea)    last test- many yrs. ago, done at 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, treated with surgery     Past Surgical History:   Past Surgical History:  Procedure Laterality Date   BRAIN SURGERY   2001   for sarcoidosis   BREAST BIOPSY Left    BREAST BIOPSY Right    BREAST EXCISIONAL BIOPSY Right    x2 benign   BREAST EXCISIONAL BIOPSY Left    benign   BREAST SURGERY Bilateral    biopsies    DILATION AND CURETTAGE OF UTERUS     HYSTEROSCOPY     LUMBAR LAMINECTOMY/DECOMPRESSION MICRODISCECTOMY Left 08/20/2016   Procedure: Left Lumbar four-five Microdiskectomy;  Surgeon: Leeroy Cha, MD;  Location: [facility];  Service: Neurosurgery;  Laterality: Left;   PARTIAL HYSTERECTOMY  2013   ROTATOR CUFF REPAIR Right    07/2013   Suboccipital craniotomy     THYROIDECTOMY     Precancerous lesion     Past OB/GYN History: OB History  Gravida Para Term Preterm AB Living  '5 5 2 3 '$ 0 4  SAB IAB Ectopic Multiple Live Births  0 0 0 0 5    # Outcome Date GA Lbr Len/2nd Weight Sex Delivery Anes PTL Lv  5 Term 11/06/92 [redacted]w[redacted]d  M CS-LTranv   LIV  4 Term 09/14/87 349w0d M    LIV  3 Preterm 05/13/86 [id number] CS-LTranv   LIV  2 Preterm 05/04/85 [id number] CS-LTranv   LIV  1 Preterm 1985    F CS-LTranv   ND   S/p hysterectomy   Medications: She has a current medication list which includes the following prescription(s): accu-chek guide, albuterol, amlodipine, aspirin, atorvastatin, b-d ultrafine iii short pen, blood glucose meter kit and supplies, carvedilol, cyclobenzaprine, cyclosporine, trulicity, fluticasone, folic acid, ajovy, jardiance, levothyroxine, losartan, metformin, methotrexate, multivitamin with minerals, ondansetron, oxycodone, pantoprazole, polyethylene glycol, and ubrelvy.   Allergies: Patient is allergic to lisinopril.   Social History:  Social History   Tobacco Use   Smoking status: Never   Smokeless tobacco: Never  Vaping Use   Vaping Use: Never used  Substance Use Topics   Alcohol use: No    Alcohol/week: 0.0 standard drinks of alcohol   Drug use: No    Relationship status: married She lives alone She is not employed. Regular exercise: No History of  abuse: No  Family History:   Family History  Problem Relation Age of Onset   Heart disease Mother        questionable CAD and arrythmias    Cancer Mother        Breast cancer   Breast cancer Mother 63  66enous thrombosis Sister    Diabetes Sister    Heart disease Sister    Diabetes Brother    Heart disease Brother    Cancer Maternal Aunt    Breast cancer Maternal Aunt    Venous thrombosis Sister    Diabetes Sister    Heart disease Sister      Review of Systems: Review of Systems  Constitutional:  Positive for malaise/fatigue and weight loss. Negative  for fever.  Respiratory:  Negative for cough, shortness of breath and wheezing.   Cardiovascular:  Negative for chest pain, palpitations and leg swelling.  Gastrointestinal:  Positive for abdominal pain. Negative for blood in stool.  Genitourinary:  Negative for dysuria.  Musculoskeletal:  Negative for myalgias.  Skin:  Negative for rash.  Neurological:  Positive for headaches. Negative for dizziness.  Endo/Heme/Allergies:  Does not bruise/bleed easily.  Psychiatric/Behavioral:  Negative for depression. The patient is not nervous/anxious.      OBJECTIVE Physical Exam: Vitals:   12/30/22 1313  BP: 116/79  Pulse: (!) 101  Weight: 199 lb (90.3 kg)  Height: 5' 8.11" (1.73 m)    Physical Exam Constitutional:      General: She is not in acute distress. Pulmonary:     Effort: Pulmonary effort is normal.  Abdominal:     General: There is no distension.     Palpations: Abdomen is soft.     Tenderness: There is no abdominal tenderness. There is no rebound.  Musculoskeletal:        General: No swelling. Normal range of motion.  Skin:    General: Skin is warm and dry.     Findings: No rash.  Neurological:     Mental Status: She is alert and oriented to person, place, and time.  Psychiatric:        Mood and Affect: Mood normal.        Behavior: Behavior normal.      GU / Detailed Urogynecologic Evaluation:   Pelvic Exam: Normal external female genitalia; Bartholin's and Skene's glands normal in appearance; urethral meatus normal in appearance, no urethral masses or discharge.   CST: negative  s/p hysterectomy: Speculum exam reveals normal vaginal mucosa with  atrophy and normal vaginal cuff.  Adnexa no mass, fullness, tenderness.    Pelvic floor strength I/V, puborectalis IV/V external anal sphincter IV/V  Pelvic floor musculature: Right levator tender, Right obturator non-tender, Left levator tender, Left obturator non-tender  POP-Q:   POP-Q  -3                                            Aa   -3                                           Ba  -6.5                                              C   2                                            Gh  3                                            Pb  6.5  tvl   -1.5                                            Ap  -1.5                                            Bp                                                 D      Rectal Exam:  Normal sphincter tone, small distal rectocele, enterocoele not present, no rectal masses, noted dyssynergia when asking the patient to bear down.  Post-Void Residual (PVR) by Bladder Scan: In order to evaluate bladder emptying, we discussed obtaining a postvoid residual and she agreed to this procedure.  Procedure: The ultrasound unit was placed on the patient's abdomen in the suprapubic region after the patient had voided. A PVR of 24 ml was obtained by bladder scan.  Laboratory Results: POC urine: + glucose   ASSESSMENT AND PLAN Ms. Warn is a 57 y.o. with:  1. Prolapse of posterior vaginal wall   2. Rectosphincteric dyssynergia   3. Chronic idiopathic constipation   4. Levator spasm   5. Urinary frequency    Stage 0 anterior, Stage I-II posterior, Stage I apical prolapse - For treatment of pelvic organ prolapse, we discussed options for management  including expectant management, conservative management, and surgical management, such as Kegels, a pessary, pelvic floor physical therapy, and specific surgical procedures. - Prolapse is not very significant on exam today, but patient is symptomatic from stool getting stuck in vaginal area. No stool noted in the rectum on exam today so symptoms are less prominent.  - She is interested in surgery and we discussed posterior repair. We reviewed the patient's specific anatomic and functional findings, with the assistance of diagrams and handouts.  We reviewed the treatment options including expectant management, conservative management, medical management, and surgical management.  We reviewed the benefits and risks of each treatment option. We discussed risks of bleeding, infection, damage to surrounding organs including bowel, bladder, blood vessels, ureters and nerves, need for further surgery, numbness and weakness at any body site, buttock pain, postoperative cognitive dysfunction, and the rarer risks of blood clot, heart attack, pneumonia, death. All her questions were answered and she verbalized understanding.    2. Pelvic floor dyssynergia - Noted on exam today and with anorectal manometry.  - She has learned some techniques in pelvic PT but I feels she may benefit from a refresher prior to surgery to help present post op constipation issues. Referral placed.  3. Constipation - For constipation, we reviewed the importance of a better bowel regimen.  We also discussed the importance of avoiding chronic straining, as it can exacerbate her pelvic floor symptoms; we discussed treating constipation and straining prior to surgery, as postoperative straining can lead to damage to the repair and recurrence of symptoms.  - We discussed initiating therapy with increasing fluid intake, fiber supplementation, stool softeners, and laxatives such as miralax. Recommended continuing with metamucil gummies and daily  miralax. Can taper miralax as needed.  4. Levator spasm - The origin of pelvic floor muscle spasm can be multifactorial, including primary, reactive to a different pain source, trauma, or even part of a centralized pain syndrome.Treatment options include pelvic floor physical therapy, local (vaginal) or oral  muscle relaxants, pelvic muscle trigger point injections or centrally acting pain medications.   - Seemed to flare up after pelvic PT sessions. Has taken muscle relaxer in the past. Prescribed flexeril '5mg'$ , can take up to three times a day as needed if she has muscle spasms. Can particularly use around PT appointments.   Request sent for surgery scheduling. Return for pre op visit.   Jaquita Folds, MD

## 2022-12-30 NOTE — Patient Instructions (Addendum)
For constipation, start miralax once a day in addition to the fiber gummies.   You have a stage 2 (out of 4) prolapse.  We discussed the fact that it is not life threatening but there are several treatment options. For treatment of pelvic organ prolapse, we discussed options for management including expectant management, conservative management, and surgical management, such as Kegels, a pessary, pelvic floor physical therapy, and specific surgical procedures.

## 2023-01-01 DIAGNOSIS — Z419 Encounter for procedure for purposes other than remedying health state, unspecified: Secondary | ICD-10-CM | POA: Diagnosis not present

## 2023-01-08 DIAGNOSIS — M79622 Pain in left upper arm: Secondary | ICD-10-CM | POA: Diagnosis not present

## 2023-01-08 DIAGNOSIS — G894 Chronic pain syndrome: Secondary | ICD-10-CM | POA: Diagnosis not present

## 2023-01-08 DIAGNOSIS — M961 Postlaminectomy syndrome, not elsewhere classified: Secondary | ICD-10-CM | POA: Diagnosis not present

## 2023-01-08 DIAGNOSIS — M25512 Pain in left shoulder: Secondary | ICD-10-CM | POA: Diagnosis not present

## 2023-01-08 DIAGNOSIS — Z79891 Long term (current) use of opiate analgesic: Secondary | ICD-10-CM | POA: Diagnosis not present

## 2023-01-08 DIAGNOSIS — M5136 Other intervertebral disc degeneration, lumbar region: Secondary | ICD-10-CM | POA: Diagnosis not present

## 2023-01-08 DIAGNOSIS — G8929 Other chronic pain: Secondary | ICD-10-CM | POA: Diagnosis not present

## 2023-01-16 ENCOUNTER — Ambulatory Visit: Payer: Medicaid Other | Admitting: Obstetrics and Gynecology

## 2023-01-19 ENCOUNTER — Encounter: Payer: Self-pay | Admitting: *Deleted

## 2023-01-26 ENCOUNTER — Encounter: Payer: Self-pay | Admitting: Neurology

## 2023-01-26 ENCOUNTER — Ambulatory Visit: Payer: Medicaid Other | Admitting: Neurology

## 2023-01-26 VITALS — BP 125/66 | HR 97 | Ht 68.0 in | Wt 198.4 lb

## 2023-01-26 DIAGNOSIS — H539 Unspecified visual disturbance: Secondary | ICD-10-CM

## 2023-01-26 DIAGNOSIS — R42 Dizziness and giddiness: Secondary | ICD-10-CM

## 2023-01-26 DIAGNOSIS — D8689 Sarcoidosis of other sites: Secondary | ICD-10-CM

## 2023-01-26 DIAGNOSIS — R5383 Other fatigue: Secondary | ICD-10-CM | POA: Diagnosis not present

## 2023-01-26 DIAGNOSIS — G43709 Chronic migraine without aura, not intractable, without status migrainosus: Secondary | ICD-10-CM

## 2023-01-26 DIAGNOSIS — R519 Headache, unspecified: Secondary | ICD-10-CM

## 2023-01-26 MED ORDER — EMGALITY 120 MG/ML ~~LOC~~ SOAJ: 240.0000 mg | Freq: Once | SUBCUTANEOUS | 11 refills | Status: AC

## 2023-01-26 NOTE — Progress Notes (Signed)
Reason for visit: Migraine headache, neurosarcoidosis  [name] [name] is an 57 y.o. female  01/26/2023: last seen 6 months ago, Ajovy was reordered, worked well at first and thn work at all. [name] [name] was ordered and denied. She already tried Aimovig and did not help. Yesterday she had the worst headache. In the back of the head. And in the shoulders, migraines behind the right eye, pulsating/pounding/throbbing, nausea, light and sound sensitivity, having 12 migraine days a month and >15 total headache days a month. More dizziness, pain in the back of the head, nausea, vomiting, occipital headaches, nocturnal/positional headaches. Ongoing for > 6 months, moderate to severe migraines. We discussed options today. She endorsed compliance for at least 6 months with ajovy.   - start Emgality, discussed compliance will check in may - failed topiramate, aimovig(she has constipation, contraindicated), amitriptyline, propranolol, imitrex, maxalt, amlodipine. Carvedilol, cymbalta, gabapentin, lisinopril,losartan, lyrica,   Patient complains of symptoms per HPI as well as the following symptoms: migraines . Pertinent negatives and positives per HPI. All others negative  07/30/2022 [name] Cruz NP:  1.  Neurosarcoidosis Last MRi stable no acute findings 2.  Chronic migraine headache -No more than 3 migraines a week  -Reorder Ajovy for migraine prevention -Reorder [name] [name] to take at onset of acute headache -MRI of the brain with and without contrast March 2023 showed age-related findings -Remains on low dose methotrexate, Dr. Jannifer Franklin was prescribing, now from PCP, claims when tried to stop completely had worsening headaches  -Follow up with 6 months with Dr. Jaynee Eagles   IMPRESSION: This MRI of the brain with and without contrast shows the following: 1.   No acute findings.  2.   Two punctate T2/FLAIR hyperintense foci in the subcortical white matter of the frontal lobes.  This is a nonspecific finding most  consistent with very minimal chronic microvascular ischemic change or minimal sequela of migraine headache.  The focus on the left was not present on the 2020 MRI 3.   Sequela of remote posterior fossa surgery, unchanged in appearance compared to the previous MRI. 4.   Normal enhancement pattern   01/27/2022: She has 2 really bad nose bleeds and it was bad. Her nose scabs and having a lot of sinus issues. She has big scabs. Dr, Jannifer Franklin used to give her OTC steroid spray. She says she is stable with her headaches, she goes to pain management for the back, she had surgery on her back, her headaches can be severe, at least 20 headache days a months, they can be mild or range to severe. She has 8 severe headaches a month for > 1 year, she gets dizzy by looking, the light bother her, nausea, movement makes it worse, no vomiting, if she bends over it hurts, positional, she can wake with them in the morning and can last 24 hours. She takes oxycodone for the back pain. She tried Aimovig, we discussed botox, discussed Ajovy. Vision changes. She had a partial hysterectomy.   MRI brain IMPRESSION: personally reviewed and agree (additional 5 minutes in addition to visit) 1. Stable postoperative changes in the posterior fossa. 2. No evidence of acute intracranial abnormality or active neurosarcoidosis. 3. Right maxillary sinus fluid and mucosal thickening, correlate for acute sinusitis.  Patient complains of symptoms per HPI as well as the following symptoms: headache . Pertinent negatives and positives per HPI. All others negative   History of present illness:  [name] [name] is a 57 year old right-handed black female with a history of neurosarcoidosis diagnosed  many years ago, the patient has been very stable in this regard for quite a number of years.  The patient was to come off of the methotrexate and last seen, but she apparently felt that her headaches were worse off the medication and restarted the drug on  her own.  She takes 2.5 mg once a week.  The patient has had some left shoulder discomfort, she is to have surgery next week for rotator cuff tear.  The patient has been seen for sleep evaluation, she has never set up the sleep evaluation as ordered.  She is planning on doing this after her shoulder surgery.  She continues to have very frequent headaches, at least 15 headache days a month.  Aimovig helped initially but has not been effective more recently.  I recommended Botox therapy, but the patient is not sure that she wants to have this done.  Her last hemoglobin A1c was 6.1.  She returns to the office today for an evaluation.  Past Medical History:  Diagnosis Date   Abdominal pain 06/25/2010   CT abd/pelvis 05/2014: Very large amount of stool noted distending the colon. The size consistent with severe constipation/fecal impaction.     Abnormal cervical Papanicolaou smear 04/17/2017   Anemia    Arthritis    lumbar region- radiculopathy   Atypical chest pain    Cancer ([place]) 2013    Thyroid Ca-thyroidectomy   Chronic fatigue    DM (diabetes mellitus) (HCC)    Gait disturbance    GERD (gastroesophageal reflux disease)    Headache(784.0)    Herniated lumbar disc without myelopathy 08/20/2016   History of osteoporosis 05/25/2007   DEXA 2014 Since the baseline examination in 2002, there has been a  statistically significant 19.5% increase in bone mineral density in  the lumbar spine and a statistically significant 14.6% increase in  bone mineral density in the left femur.   DEXA 2002 with osteoporosis of lumbar spine and moderate osteopenia of left hip MRI 2002 left hip negative for avascular necrosis  Qualifier: Diagnosis o   HTN (hypertension)    Hyperlipidemia    Hypothyroidism    Insomnia 01/02/2015   Intermittent left-sided chest pain 01/26/2013   Left sided numbness 12/10/2018   Nephrolithiasis    Nonischemic cardiomyopathy (HCC)    Obesity    OSA (obstructive sleep apnea)    last  test- many yrs. ago, done at [facility], no CPAP in use   Osteoporosis    Pain and swelling of lower leg, left 12/16/2017   Peripheral neuropathy    Prolonged QT interval    Rectocele 07/23/2020   [facility]   Sarcoidosis    sarcoidosis in Brain, treated with surgery    Past Surgical History:  Procedure Laterality Date   BRAIN SURGERY  2001   for sarcoidosis   BREAST BIOPSY Left    BREAST BIOPSY Right    BREAST EXCISIONAL BIOPSY Right    x2 benign   BREAST EXCISIONAL BIOPSY Left    benign   BREAST SURGERY Bilateral    biopsies    DILATION AND CURETTAGE OF UTERUS     HYSTEROSCOPY     LUMBAR LAMINECTOMY/DECOMPRESSION MICRODISCECTOMY Left 08/20/2016   Procedure: Left Lumbar four-five Microdiskectomy;  Surgeon: Leeroy Cha, MD;  Location: [facility];  Service: Neurosurgery;  Laterality: Left;   PARTIAL HYSTERECTOMY  2013   ROTATOR CUFF REPAIR Right    07/2013   Suboccipital craniotomy     THYROIDECTOMY     Precancerous  lesion    Family History  Problem Relation Age of Onset   Heart disease Mother        questionable CAD and arrythmias    Cancer Mother        Breast cancer   Breast cancer Mother 18   Venous thrombosis Sister    Diabetes Sister    Heart disease Sister    Diabetes Brother    Heart disease Brother    Cancer Maternal Aunt    Breast cancer Maternal Aunt    Venous thrombosis Sister    Diabetes Sister    Heart disease Sister     Social history:  reports that she has never smoked. She has never used smokeless tobacco. She reports that she does not drink alcohol and does not use drugs.    Allergies  Allergen Reactions   Lisinopril Cough    Medications:  Prior to Admission medications   Medication Sig Start Date End Date Taking? Authorizing Provider  ACCU-CHEK GUIDE test strip USE AS INSTRUCTED TO CHECK BLOOD SUGAR UP TO 1 TIME A DAY 05/13/21  Yes Sanjuan Dame, MD  albuterol (VENTOLIN HFA) 108 (90 Base) MCG/ACT inhaler Inhale 2 puffs into the lungs every  6 (six) hours as needed for wheezing or shortness of breath. 05/16/20  Yes Earlene Plater, MD  aspirin 81 MG chewable tablet Chew 81 mg by mouth daily.   Yes [provider]  atorvastatin (LIPITOR) 20 MG tablet Take 1 tablet (20 mg total) by mouth daily. 08/30/20  Yes Gaylan Gerold, DO  B-D ULTRAFINE III SHORT PEN 31G X 8 MM MISC USE TO INJECT INSULIN 1 TIME DAILY DIAG CODE E11.9 INSULIN DEPENDENT 03/12/21  Yes Aslam, Loralyn Freshwater, MD  blood glucose meter kit and supplies KIT Dispense based on patient and insurance preference. Use up to four times daily as directed. (FOR ICD-9 250.00, 250.01). 06/15/19  Yes Agyei, Obed K, MD  carvedilol (COREG) 6.25 MG tablet Take 1 tablet (6.25 mg total) by mouth 2 (two) times daily. 12/21/19  Yes Lelon Perla, MD  Dulaglutide (TRULICITY) A999333 0000000 SOPN Inject 0.75 mg into the skin once a week. 04/04/21  Yes Aldine Contes, MD  DULoxetine (CYMBALTA) 30 MG capsule duloxetine 30 mg capsule,delayed release  TAKE 1 CAPSULE BY MOUTH EVERY DAY   Yes [provider]  empagliflozin (JARDIANCE) 10 MG TABS tablet Take 1 tablet (10 mg total) by mouth daily. 02/19/21  Yes Axel Filler, MD  Erenumab-aooe (AIMOVIG) 140 MG/ML SOAJ Inject 140 mg into the skin every 30 (thirty) days. 05/09/20  Yes Kathrynn Ducking, MD  fluticasone (FLONASE) 50 MCG/ACT nasal spray PLACE 1 SPRAY INTO BOTH NOSTRILS DAILY. 05/13/21 05/13/22 Yes Sanjuan Dame, MD  folic acid (FOLVITE) 1 MG tablet TAKE 1 TABLET BY MOUTH EVERY DAY 02/04/21  Yes Gaylan Gerold, DO  levothyroxine (SYNTHROID) 137 MCG tablet TAKE 1 TABLET BY MOUTH EVERY DAY BEFORE BREAKFAST 08/23/20  Yes Aldine Contes, MD  losartan (COZAAR) 50 MG tablet TAKE 1 TABLET BY MOUTH EVERY DAY 02/15/21  Yes Sanjuan Dame, MD  metFORMIN (GLUCOPHAGE) 1000 MG tablet TAKE 1 TABLET BY MOUTH TWICE A DAY WITH MEALS 03/19/21  Yes Mosetta Anis, MD  Multiple Vitamin (MULTIVITAMIN WITH MINERALS) TABS tablet Take 1 tablet by mouth  every evening.    Yes [provider]  oxyCODONE-acetaminophen (PERCOCET) 10-325 MG tablet Take 1 tablet by mouth every 4 (four) hours as needed for pain.   Yes [provider]  pantoprazole (PROTONIX) 20  MG tablet Take 1 tablet (20 mg total) by mouth daily. 08/29/20  Yes Gaylan Gerold, DO  diazepam (VALIUM) 5 MG tablet Take 1 tablet one hour prior to scan 12/21/19   Lelon Perla, MD   Exam: NAD, pleasant                  Speech:    Speech is normal; fluent and spontaneous with normal comprehension.  Cognition:    The patient is oriented to person, place, and time;     recent and remote memory intact;     language fluent;    Cranial Nerves:    The pupils are equal, round, and reactive to light.Trigeminal sensation is intact and the muscles of mastication are normal. The face is symmetric. The palate elevates in the midline. Hearing intact. Voice is normal. Shoulder shrug is normal. The tongue has normal motion without fasciculations.   Coordination:  No dysmetria  Motor Observation:    No asymmetry, no atrophy, and no involuntary movements noted. Tone:    Normal muscle tone.     Strength:    Strength is V/V in the upper and lower limbs.      Sensation: intact to LT    Assessment/Plan: 57 year old with hx of neurosarcoidosis, worsening headaches and migraines, chronic migraines, failed multiple medications.   1.  Neurosarcoidosis, worsening headaches, repeat MRI for surveillance of neurosarcoidosis  2.  Migraine headache,migraines behind the right eye, pulsating/pounding/throbbing, nausea, light and sound sensitivity, having 12 migraine days a month and >15 total headache days a month lasts 12-48 hours. More dizziness, pain in the back of the head, nausea, vomiting, occipital headaches, nocturnal/positional headaches. Ongoing for > 6 months, moderate to severe migraines.   - start Emgality  - failed topiramate, aimovig(she has constipation, contraindicated),  amitriptyline, propranolol, imitrex, maxalt, amlodipine. Carvedilol, cymbalta, gabapentin, lisinopril,losartan, lyrica, ajovy - She declines botox - she will bring back here for [name] to inject emgality  2 injections first month- she will bring it back to [name] to inject make a note to call by the end of the week (by March 7th before goes out of town) Then one injection every month Emgality MRI of the brain w/wo contrast Blood work today   Orders Placed This Encounter  Procedures   MR BRAIN W WO CONTRAST   CBC with Differential/Platelets   Comprehensive metabolic panel   TSH Rfx on Abnormal to Free T4   Meds ordered this encounter  Medications   Galcanezumab-gnlm (EMGALITY) 120 MG/ML SOAJ    Sig: Inject 240 mg into the skin once for 1 dose.    Dispense:  2 mL    Refill:  [place], MD [facility] [address] [place], [place] [address]  Phone [phone] Fax [phone]  I spent over 45 minutes of face-to-face and non-face-to-face time with patient on the  1. Dizziness   2. Visual changes   3. Neurosarcoidosis   4. Worsening headaches   5. Nocturnal headaches   6. Chronic migraine without aura without status migrainosus, not intractable   7. Other fatigue     diagnosis.  This included previsit chart review, lab review, study review, order entry, electronic health record documentation, patient education on the different diagnostic and therapeutic options, counseling and coordination of care, risks and benefits of management, compliance, or risk factor reduction

## 2023-01-26 NOTE — Patient Instructions (Addendum)
2 injections first month- she will bring it back to [name] to inject make a note to call by the end of the week (by March 7th before goes out of town) Then one injection every month Emgality MRI of the brain w/wo contrast Blood work  [place] What is this medication? GALCANEZUMAB (gal ka NEZ ue mab) prevents migraines. It works by blocking a substance in the body that causes migraines. It may also be used to treat cluster headaches. It is a monoclonal antibody. This medicine may be used for other purposes; ask your health care provider or pharmacist if you have questions. COMMON BRAND NAME(S): Emgality What should I tell my care team before I take this medication? They need to know if you have any of these conditions: An unusual or allergic reaction to galcanezumab, other medications, foods, dyes, or preservatives Pregnant or trying to get pregnant Breast-feeding How should I use this medication? This medication is injected under the skin. You will be taught how to prepare and give it. Take it as directed on the prescription label. Keep taking it unless your care team tells you to stop. It is important that you put your used needles and syringes in a special sharps container. Do not put them in a trash can. If you do not have a sharps container, call your pharmacist or care team to get one. Talk to your care team about the use of this medication in children. Special care may be needed. Overdosage: If you think you have taken too much of this medicine contact a poison control center or emergency room at once. NOTE: This medicine is only for you. Do not share this medicine with others. What if I miss a dose? If you miss a dose, take it as soon as you can. If it is almost time for your next dose, take only that dose. Do not take double or extra doses. What may interact with this medication? Interactions are not expected. This list may not describe all possible interactions. Give your  health care provider a list of all the medicines, herbs, non-prescription drugs, or dietary supplements you use. Also tell them if you smoke, drink alcohol, or use illegal drugs. Some items may interact with your medicine. What should I watch for while using this medication? Visit your care team for regular checks on your progress. Tell your care team if your symptoms do not start to get better or if they get worse. What side effects may I notice from receiving this medication? Side effects that you should report to your care team as soon as possible: Allergic reactions or angioedema--skin rash, itching or hives, swelling of the face, eyes, lips, tongue, arms, or legs, trouble swallowing or breathing Side effects that usually do not require medical attention (report to your care team if they continue or are bothersome): Pain, redness, or irritation at injection site This list may not describe all possible side effects. Call your doctor for medical advice about side effects. You may report side effects to FDA at 1-800-FDA-1088. Where should I keep my medication? Keep out of the reach of children and pets. Store in a refrigerator or at room temperature between 20 and 25 degrees C (68 and 77 degrees F). Refrigeration (preferred): Store in the refrigerator. Do not freeze. Keep in the original container until you are ready to take it. Remove the dose from the carton about 30 minutes before it is time for you to use it. If the dose is not  used, it may be stored in original container at room temperature for 7 days. Get rid of any unused medication after the expiration date. Room Temperature: This medication may be stored at room temperature for up to 7 days. Keep it in the original container. Protect from light until time of use. If it is stored at room temperature, get rid of any unused medication after 7 days or after it expires, whichever is first. To get rid of medications that are no longer needed or  have expired: Take the medication to a medication take-back program. Check with your pharmacy or law enforcement to find a location. If you cannot return the medication, ask your pharmacist or care team how to get rid of this medication safely. NOTE: This sheet is a summary. It may not cover all possible information. If you have questions about this medicine, talk to your doctor, pharmacist, or health care provider.  2023 Elsevier/Gold Standard (2022-01-07 00:00:00)

## 2023-01-27 LAB — CBC WITH DIFFERENTIAL/PLATELET
Basophils Absolute: 0 10*3/uL (ref 0.0–0.2)
Basos: 0 %
EOS (ABSOLUTE): 0.1 10*3/uL (ref 0.0–0.4)
Eos: 2 %
Hematocrit: 39.9 % (ref 34.0–46.6)
Hemoglobin: 12.5 g/dL (ref 11.1–15.9)
Immature Grans (Abs): 0 10*3/uL (ref 0.0–0.1)
Immature Granulocytes: 0 %
Lymphocytes Absolute: 2.3 10*3/uL (ref 0.7–3.1)
Lymphs: 40 %
MCH: 26.5 pg — ABNORMAL LOW (ref 26.6–33.0)
MCHC: 31.3 g/dL — ABNORMAL LOW (ref 31.5–35.7)
MCV: 85 fL (ref 79–97)
Monocytes Absolute: 0.4 10*3/uL (ref 0.1–0.9)
Monocytes: 6 %
Neutrophils Absolute: 2.8 10*3/uL (ref 1.4–7.0)
Neutrophils: 52 %
Platelets: 344 10*3/uL (ref 150–450)
RBC: 4.72 x10E6/uL (ref 3.77–5.28)
RDW: 14.2 % (ref 11.7–15.4)
WBC: 5.6 10*3/uL (ref 3.4–10.8)

## 2023-01-27 LAB — COMPREHENSIVE METABOLIC PANEL
ALT: 21 IU/L (ref 0–32)
AST: 20 IU/L (ref 0–40)
Albumin/Globulin Ratio: 1.5 (ref 1.2–2.2)
Albumin: 4.2 g/dL (ref 3.8–4.9)
Alkaline Phosphatase: 83 IU/L (ref 44–121)
BUN/Creatinine Ratio: 18 (ref 9–23)
BUN: 16 mg/dL (ref 6–24)
Bilirubin Total: 0.2 mg/dL (ref 0.0–1.2)
CO2: 23 mmol/L (ref 20–29)
Calcium: 9.3 mg/dL (ref 8.7–10.2)
Chloride: 101 mmol/L (ref 96–106)
Creatinine, Ser: 0.88 mg/dL (ref 0.57–1.00)
Globulin, Total: 2.8 g/dL (ref 1.5–4.5)
Glucose: 103 mg/dL — ABNORMAL HIGH (ref 70–99)
Potassium: 4.1 mmol/L (ref 3.5–5.2)
Sodium: 141 mmol/L (ref 134–144)
Total Protein: 7 g/dL (ref 6.0–8.5)
eGFR: 78 mL/min/{1.73_m2} (ref >59)

## 2023-01-27 LAB — TSH RFX ON ABNORMAL TO FREE T4: TSH: 1.66 u[IU]/mL (ref 0.450–4.500)

## 2023-01-28 DIAGNOSIS — H538 Other visual disturbances: Secondary | ICD-10-CM | POA: Diagnosis not present

## 2023-01-28 DIAGNOSIS — H25813 Combined forms of age-related cataract, bilateral: Secondary | ICD-10-CM | POA: Diagnosis not present

## 2023-01-28 DIAGNOSIS — H40013 Open angle with borderline findings, low risk, bilateral: Secondary | ICD-10-CM | POA: Diagnosis not present

## 2023-01-28 DIAGNOSIS — H35033 Hypertensive retinopathy, bilateral: Secondary | ICD-10-CM | POA: Diagnosis not present

## 2023-01-28 DIAGNOSIS — E119 Type 2 diabetes mellitus without complications: Secondary | ICD-10-CM | POA: Diagnosis not present

## 2023-01-29 ENCOUNTER — Telehealth: Payer: Self-pay | Admitting: Neurology

## 2023-01-29 ENCOUNTER — Ambulatory Visit: Payer: Medicaid Other | Admitting: Neurology

## 2023-01-29 NOTE — Telephone Encounter (Signed)
[employer] med[employer] [name] KaufmannKO:[id number] exp. 01/28/2023-03/29/2023 sent to [facility] [id number]

## 2023-01-30 DIAGNOSIS — Z419 Encounter for procedure for purposes other than remedying health state, unspecified: Secondary | ICD-10-CM | POA: Diagnosis not present

## 2023-02-03 DIAGNOSIS — M5136 Other intervertebral disc degeneration, lumbar region: Secondary | ICD-10-CM | POA: Diagnosis not present

## 2023-02-03 DIAGNOSIS — G894 Chronic pain syndrome: Secondary | ICD-10-CM | POA: Diagnosis not present

## 2023-02-03 DIAGNOSIS — M961 Postlaminectomy syndrome, not elsewhere classified: Secondary | ICD-10-CM | POA: Diagnosis not present

## 2023-02-03 DIAGNOSIS — M79662 Pain in left lower leg: Secondary | ICD-10-CM | POA: Diagnosis not present

## 2023-02-03 DIAGNOSIS — M25512 Pain in left shoulder: Secondary | ICD-10-CM | POA: Diagnosis not present

## 2023-02-03 DIAGNOSIS — G8929 Other chronic pain: Secondary | ICD-10-CM | POA: Diagnosis not present

## 2023-02-04 ENCOUNTER — Encounter: Payer: Self-pay | Admitting: Obstetrics and Gynecology

## 2023-02-04 ENCOUNTER — Ambulatory Visit (INDEPENDENT_AMBULATORY_CARE_PROVIDER_SITE_OTHER): Payer: Medicaid Other | Admitting: Obstetrics and Gynecology

## 2023-02-04 VITALS — BP 116/78 | HR 86 | Wt 197.8 lb

## 2023-02-04 DIAGNOSIS — N816 Rectocele: Secondary | ICD-10-CM | POA: Diagnosis not present

## 2023-02-04 DIAGNOSIS — Z01818 Encounter for other preprocedural examination: Secondary | ICD-10-CM

## 2023-02-04 MED ORDER — ACETAMINOPHEN 500 MG PO TABS: 500.0000 mg | ORAL_TABLET | Freq: Four times a day (QID) | ORAL | 0 refills | Status: DC | PRN

## 2023-02-04 MED ORDER — POLYETHYLENE GLYCOL 3350 17 GM/SCOOP PO POWD: 17.0000 g | Freq: Every day | ORAL | 0 refills | Status: DC

## 2023-02-04 MED ORDER — IBUPROFEN 600 MG PO TABS: 600.0000 mg | ORAL_TABLET | Freq: Four times a day (QID) | ORAL | 0 refills | Status: DC | PRN

## 2023-02-04 NOTE — Progress Notes (Signed)
Urogynecology Pre-Operative Eval  Subjective Chief Complaint: [name] [name] presents for a preoperative encounter.   History of Present Illness: [name] [name] is a 57 y.o. female who presents for preoperative visit.  She is scheduled to undergo :  Exam under anesthesia, POSTERIOR REPAIR (RECTOCELE)  on 02/26/20.  Her symptoms include pelvic organ prolapse, and she was was found to have Stage 0 anterior, Stage I-II posterior, Stage I apical prolapse.   Urodynamics showed: Deferred   Past Medical History:  Diagnosis Date   Abdominal pain 06/25/2010   CT abd/pelvis 05/2014: Very large amount of stool noted distending the colon. The size consistent with severe constipation/fecal impaction.     Abnormal cervical Papanicolaou smear 04/17/2017   Anemia    Arthritis    lumbar region- radiculopathy   Atypical chest pain    Cancer ([place]) 2013    Thyroid Ca-thyroidectomy   Chronic fatigue    DM (diabetes mellitus) (HCC)    Gait disturbance    GERD (gastroesophageal reflux disease)    Headache(784.0)    Herniated lumbar disc without myelopathy 08/20/2016   History of osteoporosis 05/25/2007   DEXA 2014 Since the baseline examination in 2002, there has been a  statistically significant 19.5% increase in bone mineral density in  the lumbar spine and a statistically significant 14.6% increase in  bone mineral density in the left femur.   DEXA 2002 with osteoporosis of lumbar spine and moderate osteopenia of left hip MRI 2002 left hip negative for avascular necrosis  Qualifier: Diagnosis o   HTN (hypertension)    Hyperlipidemia    Hypothyroidism    Insomnia 01/02/2015   Intermittent left-sided chest pain 01/26/2013   Left sided numbness 12/10/2018   Nephrolithiasis    Nonischemic cardiomyopathy (HCC)    Obesity    OSA (obstructive sleep apnea)    last test- many yrs. 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, treated with surgery     Past Surgical History:  Procedure Laterality Date   BRAIN SURGERY  2001   for sarcoidosis   BREAST BIOPSY Left    BREAST BIOPSY Right    BREAST EXCISIONAL BIOPSY Right    x2 benign   BREAST EXCISIONAL BIOPSY Left    benign   BREAST SURGERY Bilateral    biopsies    DILATION AND CURETTAGE OF UTERUS     HYSTEROSCOPY     LUMBAR LAMINECTOMY/DECOMPRESSION MICRODISCECTOMY Left 08/20/2016   Procedure: Left Lumbar four-five Microdiskectomy;  Surgeon: Leeroy Cha, MD;  Location: [facility];  Service: Neurosurgery;  Laterality: Left;   PARTIAL HYSTERECTOMY  2013   ROTATOR CUFF REPAIR Right    07/2013   Suboccipital craniotomy     THYROIDECTOMY     Precancerous lesion    is allergic to lisinopril.   Family History  Problem Relation Age of Onset   Heart disease Mother        questionable CAD and arrythmias    Cancer Mother        Breast cancer   Breast cancer Mother 24   Venous thrombosis Sister    Diabetes Sister    Heart disease Sister    Diabetes Brother    Heart disease Brother    Cancer Maternal  Aunt    Breast cancer Maternal Aunt    Venous thrombosis Sister    Diabetes Sister    Heart disease Sister     Social History   Tobacco Use   Smoking status: Never   Smokeless tobacco: Never  Vaping Use   Vaping Use: Never used  Substance Use Topics   Alcohol use: No    Alcohol/week: 0.0 standard drinks of alcohol   Drug use: No     Review of Systems was negative for a full 10 system review except as noted in the History of Present Illness.   Current Outpatient Medications:    ACCU-CHEK GUIDE test strip, USE AS INSTRUCTED TO CHECK BLOOD SUGAR UP TO 1 TIME A DAY, Disp: 50 strip, Rfl: 7   albuterol (VENTOLIN HFA) 108 (90 Base) MCG/ACT inhaler, Inhale 2 puffs into the lungs every 6 (six) hours as needed for wheezing or  shortness of breath., Disp: 18 g, Rfl: 1   amLODipine (NORVASC) 5 MG tablet, Take 1 tablet by mouth daily., Disp: , Rfl:    aspirin 81 MG chewable tablet, Chew 81 mg by mouth daily., Disp: , Rfl:    atorvastatin (LIPITOR) 20 MG tablet, TAKE 1 TABLET BY MOUTH EVERY DAY, Disp: 90 tablet, Rfl: 3   B-D ULTRAFINE III SHORT PEN 31G X 8 MM MISC, USE TO INJECT INSULIN 1 TIME DAILY DIAG CODE E11.9 INSULIN DEPENDENT, Disp: 100 each, Rfl: 4   blood glucose meter kit and supplies KIT, Dispense based on patient and insurance preference. Use up to four times daily as directed. (FOR ICD-9 250.00, 250.01)., Disp: 1 each, Rfl: 0   carvedilol (COREG) 6.25 MG tablet, TAKE 1 TABLET BY MOUTH TWICE A DAY WITH FOOD, Disp: 180 tablet, Rfl: 1   cyclobenzaprine (FLEXERIL) 5 MG tablet, Take 1 tablet (5 mg total) by mouth 3 (three) times daily as needed for muscle spasms., Disp: 60 tablet, Rfl: 1   cycloSPORINE (RESTASIS) 0.05 % ophthalmic emulsion, Place 1 drop into both eyes 2 (two) times daily., Disp: , Rfl:    Dulaglutide (TRULICITY) A999333 0000000 SOPN, INJECT 0.75 MG INTO THE SKIN ONCE A WEEK., Disp: 6 mL, Rfl: 3   fluticasone (FLONASE) 50 MCG/ACT nasal spray, SPRAY 1 SPRAY INTO BOTH NOSTRILS DAILY., Disp: 16 mL, Rfl: 5   folic acid (FOLVITE) 1 MG tablet, TAKE 1 TABLET BY MOUTH EVERY DAY, Disp: 90 tablet, Rfl: 3   JARDIANCE 10 MG TABS tablet, TAKE 1 TABLET BY MOUTH EVERY DAY, Disp: 90 tablet, Rfl: 3   levothyroxine (SYNTHROID) 137 MCG tablet, TAKE 1 TABLET BY MOUTH EVERY DAY BEFORE BREAKFAST, Disp: 90 tablet, Rfl: 1   losartan (COZAAR) 50 MG tablet, TAKE 1 TABLET BY MOUTH EVERY DAY, Disp: 90 tablet, Rfl: 3   metFORMIN (GLUCOPHAGE) 1000 MG tablet, TAKE 1 TABLET (1,000 MG TOTAL) BY MOUTH TWICE A DAY WITH FOOD, Disp: 180 tablet, Rfl: 1   methotrexate (RHEUMATREX) 2.5 MG tablet, TAKE 1 TABLET (2.5 MG TOTAL) BY MOUTH ONCE A WEEK. CAUTION:CHEMOTHERAPY. PROTECT FROM LIGHT., Disp: 12 tablet, Rfl: 3   Multiple Vitamin  (MULTIVITAMIN WITH MINERALS) TABS tablet, Take 1 tablet by mouth every evening. , Disp: , Rfl:    ondansetron (ZOFRAN-ODT) 4 MG disintegrating tablet, Take by mouth., Disp: , Rfl:    oxyCODONE (ROXICODONE) 15 MG immediate release tablet, Take 30 mg by mouth every 6 (six) hours., Disp: , Rfl:    pantoprazole (PROTONIX) 20 MG tablet, Take 1 tablet (20 mg total) by mouth daily.,  Disp: 90 tablet, Rfl: 1   polyethylene glycol (COLYTE) 240 g solution, Drink 8 ounces every 10 minutes until the entire solution is consumed, Disp: 4000 mL, Rfl: 0   Objective There were no vitals filed for this visit.  Gen: NAD CV: S1 S2 RRR Lungs: Clear to auscultation bilaterally Abd: soft, nontender   Previous Pelvic Exam showed: Normal external female genitalia; Bartholin's and Skene's glands normal in appearance; urethral meatus normal in appearance, no urethral masses or discharge.    CST: negative   s/p hysterectomy: Speculum exam reveals normal vaginal mucosa with  atrophy and normal vaginal cuff.  Adnexa no mass, fullness, tenderness.     Pelvic floor strength I/V, puborectalis IV/V external anal sphincter IV/V   Pelvic floor musculature: Right levator tender, Right obturator non-tender, Left levator tender, Left obturator non-tender   POP-Q:    POP-Q   -3                                            Aa   -3                                           Ba   -6.5                                              C    2                                            Gh   3                                            Pb   6.5                                            tvl    -1.5                                            Ap   -1.5                                            Bp                                                  D          Rectal Exam:  Normal sphincter tone, small distal rectocele, enterocoele not present, no  rectal masses, noted dyssynergia when asking the patient to bear  down.    Assessment/ Plan  Assessment: The patient is a 57 y.o. year old scheduled to undergo Exam under anesthesia, [place] (RECTOCELE). Verbal consent was obtained for these procedures.  Plan: General Surgical Consent: The patient has previously been counseled on alternative treatments, and the decision by the patient and provider was to proceed with the procedure listed above.  For all procedures, there are risks of bleeding, infection, damage to surrounding organs including but not limited to bowel, bladder, blood vessels, ureters and nerves, and need for further surgery if an injury were to occur. These risks are all low with minimally invasive surgery.   There are risks of numbness and weakness at any body site or buttock/rectal pain.  It is possible that baseline pain can be worsened by surgery, either with or without mesh. If surgery is vaginal, there is also a low risk of possible conversion to laparoscopy or open abdominal incision where indicated. Very rare risks include blood transfusion, blood clot, heart attack, pneumonia, or death.   There is also a risk of short-term postoperative urinary retention with need to use a catheter. About half of patients need to go home from surgery with a catheter, which is then later removed in the office. The risk of long-term need for a catheter is very low. There is also a risk of worsening of overactive bladder.    Prolapse (with or without mesh): Risk factors for surgical failure  include things that put pressure on your pelvis and the surgical repair, including obesity, chronic cough, and heavy lifting or straining (including lifting children or adults, straining on the toilet, or lifting heavy objects such as furniture or anything weighing >25 lbs. Risks of recurrence is 20-30% with vaginal native tissue repair and a less than 10% with sacrocolpopexy with mesh.    We discussed consent for blood products. Risks for blood transfusion  include allergic reactions, other reactions that can affect different body organs and managed accordingly, transmission of infectious diseases such as HIV or Hepatitis. However, the blood is screened. Patient consents for blood products.  Pre-operative instructions:  She was instructed to not take Aspirin/NSAIDs x 7days prior to surgery. She may continue her '81mg'$  ASA. Antibiotic prophylaxis was ordered as indicated.  Catheter use: Patient will go home with foley if needed after post-operative voiding trial.  Post-operative instructions:  She was provided with specific post-operative instructions, including precautions and signs/symptoms for which we would recommend contacting us, in addition to daytime and after-hours contact phone numbers. This was provided on a handout.   Post-operative medications: Prescriptions for motrin, tylenol, and miralax were sent to her pharmacy. Discussed using ibuprofen and tylenol on a schedule to limit use of narcotics. Letter faxed to pain management as patient currently takes '15mg'$  of Roxicodone every 4 hours. Awaiting response.   Laboratory testing:  We will check labs: As requested by anesthesia.   Preoperative clearance:  She does not require surgical clearance.    Post-operative follow-up:  A post-operative appointment will be made for 6 weeks from the date of surgery. If she needs a post-operative nurse visit for a voiding trial, that will be set up after she leaves the hospital.    Patient will call the clinic or use MyChart should anything change or any new issues arise.   Berton Mount, NP

## 2023-02-04 NOTE — H&P (Signed)
[facility] H&P  Subjective Chief Complaint: [name] [name] presents for a preoperative encounter.   History of Present Illness: [name] [name] is a 57 y.o. female who presents for preoperative visit.  She is scheduled to undergo :  Exam under anesthesia, POSTERIOR REPAIR (RECTOCELE)  on 02/26/20.  Her symptoms include pelvic organ prolapse, and she was was found to have Stage 0 anterior, Stage I-II posterior, Stage I apical prolapse.   Urodynamics showed: Deferred   Past Medical History:  Diagnosis Date   Abdominal pain 06/25/2010   CT abd/pelvis 05/2014: Very large amount of stool noted distending the colon. The size consistent with severe constipation/fecal impaction.     Abnormal cervical Papanicolaou smear 04/17/2017   Anemia    Arthritis    lumbar region- radiculopathy   Atypical chest pain    Cancer ([place]) 2013    Thyroid Ca-thyroidectomy   Chronic fatigue    DM (diabetes mellitus) (HCC)    Gait disturbance    GERD (gastroesophageal reflux disease)    Headache(784.0)    Herniated lumbar disc without myelopathy 08/20/2016   History of osteoporosis 05/25/2007   DEXA 2014 Since the baseline examination in 2002, there has been a  statistically significant 19.5% increase in bone mineral density in  the lumbar spine and a statistically significant 14.6% increase in  bone mineral density in the left femur.   DEXA 2002 with osteoporosis of lumbar spine and moderate osteopenia of left hip MRI 2002 left hip negative for avascular necrosis  Qualifier: Diagnosis o   HTN (hypertension)    Hyperlipidemia    Hypothyroidism    Insomnia 01/02/2015   Intermittent left-sided chest pain 01/26/2013   Left sided numbness 12/10/2018   Nephrolithiasis    Nonischemic cardiomyopathy (HCC)    Obesity    OSA (obstructive sleep apnea)    last test- many yrs. 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, treated with surgery     Past Surgical History:  Procedure Laterality Date   BRAIN SURGERY  2001   for sarcoidosis   BREAST BIOPSY Left    BREAST BIOPSY Right    BREAST EXCISIONAL BIOPSY Right    x2 benign   BREAST EXCISIONAL BIOPSY Left    benign   BREAST SURGERY Bilateral    biopsies    DILATION AND CURETTAGE OF UTERUS     HYSTEROSCOPY     LUMBAR LAMINECTOMY/DECOMPRESSION MICRODISCECTOMY Left 08/20/2016   Procedure: Left Lumbar four-five Microdiskectomy;  Surgeon: Leeroy Cha, MD;  Location: [facility];  Service: Neurosurgery;  Laterality: Left;   PARTIAL HYSTERECTOMY  2013   ROTATOR CUFF REPAIR Right    07/2013   Suboccipital craniotomy     THYROIDECTOMY     Precancerous lesion    is allergic to lisinopril.   Family History  Problem Relation Age of Onset   Heart disease Mother        questionable CAD and arrythmias    Cancer Mother        Breast cancer   Breast cancer Mother 22   Venous thrombosis Sister    Diabetes Sister    Heart disease Sister    Diabetes Brother    Heart disease Brother    Cancer Maternal Aunt  Breast cancer Maternal Aunt    Venous thrombosis Sister    Diabetes Sister    Heart disease Sister     Social History   Tobacco Use   Smoking status: Never   Smokeless tobacco: Never  Vaping Use   Vaping Use: Never used  Substance Use Topics   Alcohol use: No    Alcohol/week: 0.0 standard drinks of alcohol   Drug use: No     Review of Systems was negative for a full 10 system review except as noted in the History of Present Illness.  No current facility-administered medications for this encounter.  Current Outpatient Medications:    ACCU-CHEK GUIDE test strip, USE AS INSTRUCTED TO CHECK BLOOD SUGAR UP TO 1 TIME A DAY, Disp: 50 strip, Rfl: 7   acetaminophen (TYLENOL) 500 MG tablet, Take 1 tablet (500 mg total) by mouth every 6 (six)  hours as needed (pain)., Disp: 30 tablet, Rfl: 0   albuterol (VENTOLIN HFA) 108 (90 Base) MCG/ACT inhaler, Inhale 2 puffs into the lungs every 6 (six) hours as needed for wheezing or shortness of breath., Disp: 18 g, Rfl: 1   amLODipine (NORVASC) 5 MG tablet, Take 1 tablet by mouth daily., Disp: , Rfl:    aspirin 81 MG chewable tablet, Chew 81 mg by mouth daily., Disp: , Rfl:    atorvastatin (LIPITOR) 20 MG tablet, TAKE 1 TABLET BY MOUTH EVERY DAY, Disp: 90 tablet, Rfl: 3   B-D ULTRAFINE III SHORT PEN 31G X 8 MM MISC, USE TO INJECT INSULIN 1 TIME DAILY DIAG CODE E11.9 INSULIN DEPENDENT, Disp: 100 each, Rfl: 4   blood glucose meter kit and supplies KIT, Dispense based on patient and insurance preference. Use up to four times daily as directed. (FOR ICD-9 250.00, 250.01)., Disp: 1 each, Rfl: 0   carvedilol (COREG) 6.25 MG tablet, TAKE 1 TABLET BY MOUTH TWICE A DAY WITH FOOD, Disp: 180 tablet, Rfl: 1   cyclobenzaprine (FLEXERIL) 5 MG tablet, Take 1 tablet (5 mg total) by mouth 3 (three) times daily as needed for muscle spasms., Disp: 60 tablet, Rfl: 1   cycloSPORINE (RESTASIS) 0.05 % ophthalmic emulsion, Place 1 drop into both eyes 2 (two) times daily., Disp: , Rfl:    Dulaglutide (TRULICITY) A999333 0000000 SOPN, INJECT 0.75 MG INTO THE SKIN ONCE A WEEK., Disp: 6 mL, Rfl: 3   fluticasone (FLONASE) 50 MCG/ACT nasal spray, SPRAY 1 SPRAY INTO BOTH NOSTRILS DAILY., Disp: 16 mL, Rfl: 5   folic acid (FOLVITE) 1 MG tablet, TAKE 1 TABLET BY MOUTH EVERY DAY, Disp: 90 tablet, Rfl: 3   ibuprofen (ADVIL) 600 MG tablet, Take 1 tablet (600 mg total) by mouth every 6 (six) hours as needed., Disp: 30 tablet, Rfl: 0   JARDIANCE 10 MG TABS tablet, TAKE 1 TABLET BY MOUTH EVERY DAY, Disp: 90 tablet, Rfl: 3   levothyroxine (SYNTHROID) 137 MCG tablet, TAKE 1 TABLET BY MOUTH EVERY DAY BEFORE BREAKFAST, Disp: 90 tablet, Rfl: 1   losartan (COZAAR) 50 MG tablet, TAKE 1 TABLET BY MOUTH EVERY DAY, Disp: 90 tablet, Rfl: 3    metFORMIN (GLUCOPHAGE) 1000 MG tablet, TAKE 1 TABLET (1,000 MG TOTAL) BY MOUTH TWICE A DAY WITH FOOD, Disp: 180 tablet, Rfl: 1   methotrexate (RHEUMATREX) 2.5 MG tablet, TAKE 1 TABLET (2.5 MG TOTAL) BY MOUTH ONCE A WEEK. CAUTION:CHEMOTHERAPY. PROTECT FROM LIGHT., Disp: 12 tablet, Rfl: 3   Multiple Vitamin (MULTIVITAMIN WITH MINERALS) TABS tablet, Take 1 tablet by mouth every evening. , Disp: ,  Rfl:    ondansetron (ZOFRAN-ODT) 4 MG disintegrating tablet, Take by mouth., Disp: , Rfl:    oxyCODONE (ROXICODONE) 15 MG immediate release tablet, Take 30 mg by mouth every 6 (six) hours., Disp: , Rfl:    pantoprazole (PROTONIX) 20 MG tablet, Take 1 tablet (20 mg total) by mouth daily., Disp: 90 tablet, Rfl: 1   polyethylene glycol (COLYTE) 240 g solution, Drink 8 ounces every 10 minutes until the entire solution is consumed, Disp: 4000 mL, Rfl: 0   polyethylene glycol powder (GLYCOLAX/MIRALAX) 17 GM/SCOOP powder, Take 17 g by mouth daily. Drink 17g (1 scoop) dissolved in water per day., Disp: 255 g, Rfl: 0   Objective There were no vitals filed for this visit.  Gen: NAD CV: S1 S2 RRR Lungs: Clear to auscultation bilaterally Abd: soft, nontender   Previous Pelvic Exam showed: Normal external female genitalia; Bartholin's and Skene's glands normal in appearance; urethral meatus normal in appearance, no urethral masses or discharge.    CST: negative   s/p hysterectomy: Speculum exam reveals normal vaginal mucosa with  atrophy and normal vaginal cuff.  Adnexa no mass, fullness, tenderness.     Pelvic floor strength I/V, puborectalis IV/V external anal sphincter IV/V   Pelvic floor musculature: Right levator tender, Right obturator non-tender, Left levator tender, Left obturator non-tender   POP-Q:    POP-Q   -3                                            Aa   -3                                           Ba   -6.5                                              C    2                                             Gh   3                                            Pb   6.5                                            tvl    -1.5                                            Ap   -1.5  Bp                                                  D          Rectal Exam:  Normal sphincter tone, small distal rectocele, enterocoele not present, no rectal masses, noted dyssynergia when asking the patient to bear down.    Assessment/ Plan  Assessment: The patient is a 57 y.o. year old scheduled to undergo Exam under anesthesia, [place] (RECTOCELE). Verbal consent was obtained for these procedures.

## 2023-02-23 ENCOUNTER — Encounter (HOSPITAL_BASED_OUTPATIENT_CLINIC_OR_DEPARTMENT_OTHER): Payer: Self-pay | Admitting: Obstetrics and Gynecology

## 2023-02-24 ENCOUNTER — Encounter (HOSPITAL_BASED_OUTPATIENT_CLINIC_OR_DEPARTMENT_OTHER): Payer: Self-pay | Admitting: Obstetrics and Gynecology

## 2023-02-24 NOTE — Progress Notes (Signed)
Spoke w/ via phone for pre-op interview--- pt Lab needs dos---- [employer] results------ current EKG in epic/ chart COVID test -----patient states asymptomatic no test needed Arrive at ------- 0530 on 02-26-2023 NPO after MN NO Solid Food.  Clear liquids from MN until--- 0430 Med rec completed Medications to take morning of surgery ----- coreg, synthroid, eye drops Diabetic medication ----- do not take metformin morning of surgery. Pt stated she was given instructions about not doing trulicity week prior to surgery.  Pt last dose was Wednesday 02-18-2023, informed pt not do to her dose tomorrow Wednesday prior to surgery and explained why.  Pt verbalized understanding. Patient instructed no nail polish to be worn day of surgery Patient instructed to bring photo id and insurance card day of surgery Patient aware to have Driver (ride ) / caregiver    for 24 hours after surgery -- husband, jose Patient Special Instructions ----- n/a Pre-Op special Istructions ----- n/a Patient verbalized understanding of instructions that were given at this phone interview. Patient denies shortness of breath, chest pain, fever, cough at this phone interview.  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

## 2023-02-26 ENCOUNTER — Encounter (HOSPITAL_BASED_OUTPATIENT_CLINIC_OR_DEPARTMENT_OTHER): Payer: Self-pay | Admitting: Obstetrics and Gynecology

## 2023-02-26 ENCOUNTER — Other Ambulatory Visit: Payer: Self-pay

## 2023-02-26 ENCOUNTER — Telehealth: Payer: Self-pay | Admitting: Obstetrics and Gynecology

## 2023-02-26 ENCOUNTER — Encounter (HOSPITAL_BASED_OUTPATIENT_CLINIC_OR_DEPARTMENT_OTHER)
Admission: RE | Disposition: A | Discharge status: Home / Self Care | Payer: Self-pay | Source: Home / Self Care | Attending: Obstetrics and Gynecology

## 2023-02-26 ENCOUNTER — Ambulatory Visit (HOSPITAL_BASED_OUTPATIENT_CLINIC_OR_DEPARTMENT_OTHER)
Admission: RE | Admit: 2023-02-26 | Discharge: 2023-02-26 | Disposition: A | Discharge status: Home / Self Care | Payer: Medicaid Other | Attending: Obstetrics and Gynecology | Admitting: Obstetrics and Gynecology

## 2023-02-26 ENCOUNTER — Ambulatory Visit (HOSPITAL_BASED_OUTPATIENT_CLINIC_OR_DEPARTMENT_OTHER): Payer: Medicaid Other

## 2023-02-26 DIAGNOSIS — Z7984 Long term (current) use of oral hypoglycemic drugs: Secondary | ICD-10-CM | POA: Diagnosis not present

## 2023-02-26 DIAGNOSIS — E119 Type 2 diabetes mellitus without complications: Secondary | ICD-10-CM

## 2023-02-26 DIAGNOSIS — N816 Rectocele: Secondary | ICD-10-CM

## 2023-02-26 DIAGNOSIS — M1990 Unspecified osteoarthritis, unspecified site: Secondary | ICD-10-CM | POA: Diagnosis not present

## 2023-02-26 DIAGNOSIS — Z79899 Other long term (current) drug therapy: Secondary | ICD-10-CM | POA: Insufficient documentation

## 2023-02-26 DIAGNOSIS — E890 Postprocedural hypothyroidism: Secondary | ICD-10-CM | POA: Diagnosis not present

## 2023-02-26 DIAGNOSIS — K219 Gastro-esophageal reflux disease without esophagitis: Secondary | ICD-10-CM | POA: Diagnosis not present

## 2023-02-26 DIAGNOSIS — E039 Hypothyroidism, unspecified: Secondary | ICD-10-CM

## 2023-02-26 DIAGNOSIS — Z7985 Long-term (current) use of injectable non-insulin antidiabetic drugs: Secondary | ICD-10-CM | POA: Insufficient documentation

## 2023-02-26 DIAGNOSIS — Z01818 Encounter for other preprocedural examination: Secondary | ICD-10-CM

## 2023-02-26 DIAGNOSIS — I509 Heart failure, unspecified: Secondary | ICD-10-CM | POA: Diagnosis not present

## 2023-02-26 DIAGNOSIS — I110 Hypertensive heart disease with heart failure: Secondary | ICD-10-CM | POA: Diagnosis not present

## 2023-02-26 DIAGNOSIS — G4733 Obstructive sleep apnea (adult) (pediatric): Secondary | ICD-10-CM | POA: Insufficient documentation

## 2023-02-26 HISTORY — DX: Other constipation: K59.09

## 2023-02-26 HISTORY — DX: Personal history of urinary calculi: Z87.442

## 2023-02-26 HISTORY — PX: RECTOCELE REPAIR: SHX761

## 2023-02-26 HISTORY — DX: Type 2 diabetes mellitus without complications: E11.9

## 2023-02-26 HISTORY — DX: Stress incontinence (female) (male): N39.3

## 2023-02-26 HISTORY — DX: Presence of spectacles and contact lenses: Z97.3

## 2023-02-26 HISTORY — DX: Chronic migraine without aura, not intractable, without status migrainosus: G43.709

## 2023-02-26 LAB — POCT I-STAT, CHEM 8
BUN: 21 mg/dL — ABNORMAL HIGH (ref 6–20)
Calcium, Ion: 1.1 mmol/L — ABNORMAL LOW (ref 1.15–1.40)
Chloride: 103 mmol/L (ref 98–111)
Creatinine, Ser: 0.7 mg/dL (ref 0.44–1.00)
Glucose, Bld: 99 mg/dL (ref 70–99)
HCT: 37 % (ref 36.0–46.0)
Hemoglobin: 12.6 g/dL (ref 12.0–15.0)
Potassium: 5.7 mmol/L — ABNORMAL HIGH (ref 3.5–5.1)
Sodium: 140 mmol/L (ref 135–145)
TCO2: 30 mmol/L (ref 22–32)

## 2023-02-26 LAB — GLUCOSE, CAPILLARY: Glucose-Capillary: 100 mg/dL — ABNORMAL HIGH (ref 70–99)

## 2023-02-26 SURGERY — COLPORRHAPHY, POSTERIOR, FOR RECTOCELE REPAIR
Anesthesia: General | Site: Vagina

## 2023-02-26 MED ORDER — ACETAMINOPHEN 500 MG PO TABS
ORAL_TABLET | ORAL | Status: AC
  Filled 2023-02-26: qty 2

## 2023-02-26 MED ORDER — PROPOFOL 10 MG/ML IV BOLUS
INTRAVENOUS | Status: AC
  Filled 2023-02-26: qty 20

## 2023-02-26 MED ORDER — CEFAZOLIN SODIUM-DEXTROSE 2-4 GM/100ML-% IV SOLN
2.0000 g | INTRAVENOUS | Status: AC
  Administered 2023-02-26: 2 g via INTRAVENOUS

## 2023-02-26 MED ORDER — PROPOFOL 10 MG/ML IV BOLUS
INTRAVENOUS | Status: DC | PRN
  Administered 2023-02-26 (×2): 200 mg via INTRAVENOUS

## 2023-02-26 MED ORDER — ONDANSETRON HCL 4 MG/2ML IJ SOLN
INTRAMUSCULAR | Status: DC | PRN
  Administered 2023-02-26: 4 mg via INTRAVENOUS

## 2023-02-26 MED ORDER — CEFAZOLIN SODIUM-DEXTROSE 2-4 GM/100ML-% IV SOLN
INTRAVENOUS | Status: AC
  Filled 2023-02-26: qty 100

## 2023-02-26 MED ORDER — ACETAMINOPHEN 500 MG PO TABS
1000.0000 mg | ORAL_TABLET | ORAL | Status: AC
  Administered 2023-02-26: 1000 mg via ORAL

## 2023-02-26 MED ORDER — GABAPENTIN 300 MG PO CAPS
300.0000 mg | ORAL_CAPSULE | ORAL | Status: AC
  Administered 2023-02-26: 300 mg via ORAL

## 2023-02-26 MED ORDER — ONDANSETRON HCL 4 MG/2ML IJ SOLN
INTRAMUSCULAR | Status: AC
  Filled 2023-02-26: qty 2

## 2023-02-26 MED ORDER — FENTANYL CITRATE (PF) 100 MCG/2ML IJ SOLN
INTRAMUSCULAR | Status: AC
  Filled 2023-02-26: qty 2

## 2023-02-26 MED ORDER — DEXAMETHASONE SODIUM PHOSPHATE 10 MG/ML IJ SOLN
INTRAMUSCULAR | Status: AC
  Filled 2023-02-26: qty 1

## 2023-02-26 MED ORDER — FENTANYL CITRATE (PF) 100 MCG/2ML IJ SOLN
INTRAMUSCULAR | Status: DC | PRN
  Administered 2023-02-26 (×2): 50 ug via INTRAVENOUS

## 2023-02-26 MED ORDER — GABAPENTIN 300 MG PO CAPS
ORAL_CAPSULE | ORAL | Status: AC
  Filled 2023-02-26: qty 1

## 2023-02-26 MED ORDER — FENTANYL CITRATE (PF) 100 MCG/2ML IJ SOLN: 25.0000 ug | INTRAMUSCULAR | Status: DC | PRN

## 2023-02-26 MED ORDER — LIDOCAINE-EPINEPHRINE 1 %-1:100000 IJ SOLN
INTRAMUSCULAR | Status: DC | PRN
  Administered 2023-02-26: 5 mL

## 2023-02-26 MED ORDER — MIDAZOLAM HCL 2 MG/2ML IJ SOLN
INTRAMUSCULAR | Status: AC
  Filled 2023-02-26: qty 2

## 2023-02-26 MED ORDER — LIDOCAINE HCL (PF) 2 % IJ SOLN
INTRAMUSCULAR | Status: AC
  Filled 2023-02-26: qty 5

## 2023-02-26 MED ORDER — LACTATED RINGERS IV SOLN: INTRAVENOUS | Status: DC

## 2023-02-26 MED ORDER — MIDAZOLAM HCL 5 MG/5ML IJ SOLN
INTRAMUSCULAR | Status: DC | PRN
  Administered 2023-02-26: 2 mg via INTRAVENOUS

## 2023-02-26 MED ORDER — 0.9 % SODIUM CHLORIDE (POUR BTL) OPTIME
TOPICAL | Status: DC | PRN
  Administered 2023-02-26: 500 mL

## 2023-02-26 MED ORDER — DEXAMETHASONE SODIUM PHOSPHATE 10 MG/ML IJ SOLN
INTRAMUSCULAR | Status: DC | PRN
  Administered 2023-02-26: 10 mg via INTRAVENOUS

## 2023-02-26 MED ORDER — LIDOCAINE 2% (20 MG/ML) 5 ML SYRINGE
INTRAMUSCULAR | Status: DC | PRN
  Administered 2023-02-26: 100 mg via INTRAVENOUS

## 2023-02-26 SURGICAL SUPPLY — 33 items
AGENT HMST KT MTR STRL THRMB (HEMOSTASIS)
BLADE SURG 15 STRL LF DISP TIS (BLADE) ×1 IMPLANT
BLADE SURG 15 STRL SS (BLADE) ×1
GAUZE 4X4 16PLY ~~LOC~~+RFID DBL (SPONGE) IMPLANT
GLOVE BIOGEL PI IND STRL 6.5 (GLOVE) ×1 IMPLANT
GLOVE BIOGEL PI IND STRL 7.0 (GLOVE) ×1 IMPLANT
GLOVE ECLIPSE 6.0 STRL STRAW (GLOVE) ×1 IMPLANT
GOWN STRL REUS W/TWL LRG LVL3 (GOWN DISPOSABLE) ×1 IMPLANT
HIBICLENS CHG 4% 4OZ BTL (MISCELLANEOUS) ×1 IMPLANT
HOLDER FOLEY CATH W/STRAP (MISCELLANEOUS) ×1 IMPLANT
KIT TURNOVER CYSTO (KITS) ×1 IMPLANT
NDL HYPO 22X1.5 SAFETY MO (MISCELLANEOUS) ×1 IMPLANT
NDL MAYO 6 CRC TAPER PT (NEEDLE) IMPLANT
NEEDLE HYPO 22X1.5 SAFETY MO (MISCELLANEOUS) ×1 IMPLANT
NEEDLE MAYO 6 CRC TAPER PT (NEEDLE) IMPLANT
NS IRRIG 500ML POUR BTL (IV SOLUTION) IMPLANT
PACK VAGINAL WOMENS (CUSTOM PROCEDURE TRAY) ×1 IMPLANT
PAD OB MATERNITY 4.3X12.25 (PERSONAL CARE ITEMS) ×1 IMPLANT
RETRACTOR LONE STAR DISPOSABLE (INSTRUMENTS) ×1 IMPLANT
RETRACTOR STAY HOOK 5MM (MISCELLANEOUS) ×1 IMPLANT
SET IRRIG Y TYPE TUR BLADDER L (SET/KITS/TRAYS/PACK) ×1 IMPLANT
SLEEVE SCD COMPRESS KNEE MED (STOCKING) ×1 IMPLANT
SPIKE FLUID TRANSFER (MISCELLANEOUS) IMPLANT
SURGIFLO W/THROMBIN 8M KIT (HEMOSTASIS) IMPLANT
SUT ABS MONO DBL WITH NDL 48IN (SUTURE) IMPLANT
SUT VIC AB 0 CT1 27 (SUTURE)
SUT VIC AB 0 CT1 27XBRD ANBCTR (SUTURE) IMPLANT
SUT VIC AB 2-0 SH 27 (SUTURE)
SUT VIC AB 2-0 SH 27XBRD (SUTURE) IMPLANT
SUT VICRYL 2-0 SH 8X27 (SUTURE) ×1 IMPLANT
SYR BULB EAR ULCER 3OZ GRN STR (SYRINGE) ×1 IMPLANT
TOWEL OR 17X24 6PK STRL BLUE (TOWEL DISPOSABLE) ×1 IMPLANT
TRAY FOLEY W/BAG SLVR 14FR LF (SET/KITS/TRAYS/PACK) ×1 IMPLANT

## 2023-02-26 NOTE — Interval H&P Note (Signed)
History and Physical Interval Note:  02/26/2023 7:22 AM  [name] [name]  has presented today for surgery, with the diagnosis of posterior vaginal prolapse.  The various methods of treatment have been discussed with the patient and family. After consideration of risks, benefits and other options for treatment, the patient has consented to  Procedure(s) with comments: POSTERIOR REPAIR (RECTOCELE) (N/A) -  as a surgical intervention.  The patient's history has been reviewed, patient examined, no change in status, stable for surgery.  I have reviewed the patient's chart and labs.  Questions were answered to the patient's satisfaction.     Jaquita Folds

## 2023-02-26 NOTE — Anesthesia Postprocedure Evaluation (Signed)
Anesthesia Post Note  Patient: [name] [name]  Procedure(s) Performed: POSTERIOR REPAIR (RECTOCELE) (Vagina )     Patient location during evaluation: PACU Anesthesia Type: General Level of consciousness: awake and alert Pain management: pain level controlled Vital Signs Assessment: post-procedure vital signs reviewed and stable Respiratory status: spontaneous breathing, nonlabored ventilation, respiratory function stable and patient connected to nasal cannula oxygen Cardiovascular status: blood pressure returned to baseline and stable Postop Assessment: no apparent nausea or vomiting Anesthetic complications: no  No notable events documented.  Last Vitals:  Vitals:   02/26/23 0945 02/26/23 1030  BP: 123/74 125/69  Pulse: 70 69  Resp: 16 14  Temp:  36.6 C  SpO2: 97% 98%    Last Pain:  Vitals:   02/26/23 1030  TempSrc:   PainSc: 0-No pain                 [name]

## 2023-02-26 NOTE — Telephone Encounter (Signed)
[name] [name] underwent posterior repair on 02/26/23.   She did not have a formal voiding trial but was able to void prior to discharge. Please call her for a routine post op check . Thanks!  Jaquita Folds, MD

## 2023-02-26 NOTE — Anesthesia Procedure Notes (Addendum)
Procedure Name: LMA Insertion Date/Time: 02/26/2023 7:43 AM  Performed by: Chandrea Zellman D, CRNAPre-anesthesia Checklist: Patient identified, Emergency Drugs available, Suction available and Patient being monitored Patient Re-evaluated:Patient Re-evaluated prior to induction Oxygen Delivery Method: Circle system utilized Preoxygenation: Pre-oxygenation with 100% oxygen Induction Type: IV induction Ventilation: Mask ventilation without difficulty LMA: LMA inserted LMA Size: 4.0 Tube type: Oral Number of attempts: 1 Placement Confirmation: positive ETCO2 and breath sounds checked- equal and bilateral Tube secured with: Tape Dental Injury: Teeth and Oropharynx as per pre-operative assessment

## 2023-02-26 NOTE — Op Note (Signed)
Operative Note  Preoperative Diagnosis: posterior vaginal prolapse  Postoperative Diagnosis: same  Procedures performed:  Posterior repair  Implants: none  Attending Surgeon: Sherlene Shams, MD  Assistant Surgeon: Haydee Monica, MD PGY4  Anesthesia: General LMA  Findings: On vaginal exam, posterior vaginal prolapse noted   Specimens: * No specimens in log *  Estimated blood loss: 20 mL  IV fluids: 700 mL  Urine output: 10 mL  Complications: none  Procedure in Detail:  After informed consent was obtained, the patient was taken to the operating room where general anesthesia was induced. She was placed in dorsal lithotomy position, taking care to avoid any traction of the extremities and prepped and draped in the usual sterile fashion. A self-retaining lonestar retractor was placed using four elastic blue stays.  After a foley catheter was inserted into the urethra.  Two Allis clamps were placed in the midline of the posterior vaginal wall defect.  1% lidocaine with epinephrine was injected into the vaginal mucosa and over the perineum. A vertical incision was made between these clamps with a 15 blade scalpel and a diamond shaped incision was made over the introitus. Excess perineal skin was removed.  The rectovaginal septum was then dissected off the vaginal mucosa bilaterally.  No enterocele was noted.  The rectovaginal septum was then reapproximated with plicating sutures of 2-0 Vicryl.  After placement of the first plication stitch two fingers were inserted into the vaginal to confirm adequate caliber.  The last distal stitch incorporated the perineal body in a U stitch fashion.  After plication, the excess vaginal mucosa was trimmed and the vaginal mucosa was reapproximated using 2-0 Vicryl sutures in a running fashion.  Irrigation was performed and good hemostasis was noted. Vaginal packing was not placed. The foley catheter was removed.  A rectal examination was normal and  confirmed no sutures within the rectum.  The patient tolerated the procedure well.  She was awakened from anesthesia and transferred to the recovery room in stable condition. All counts were correct x 2.    Jaquita Folds, MD

## 2023-02-26 NOTE — Anesthesia Preprocedure Evaluation (Addendum)
Anesthesia Evaluation  Patient identified by MRN, date of birth, ID band Patient awake    Reviewed: Allergy & Precautions, NPO status , Patient's Chart, lab work & pertinent test results, reviewed documented beta blocker date and time   Airway Mallampati: II  TM Distance: >3 FB Neck ROM: Full    Dental  (+) Chipped,    Pulmonary sleep apnea    Pulmonary exam normal breath sounds clear to auscultation       Cardiovascular hypertension, Pt. on home beta blockers and Pt. on medications +CHF  Normal cardiovascular exam Rhythm:Regular Rate:Normal  Long QT  TTE 2023  1. Left ventricular ejection fraction, by estimation, is 60 to 65%. The  left ventricle has normal function. The left ventricle has no regional  wall motion abnormalities. Left ventricular diastolic parameters are  consistent with Grade I diastolic  dysfunction (impaired relaxation).   2. Right ventricular systolic function is normal. The right ventricular  size is normal. There is normal pulmonary artery systolic pressure. The  estimated right ventricular systolic pressure is [id number] mmHg.   3. The mitral valve is normal in structure. No evidence of mitral valve  regurgitation. No evidence of mitral stenosis.   4. The aortic valve is normal in structure. Aortic valve regurgitation is  not visualized. No aortic stenosis is present.   5. The inferior vena cava is normal in size with greater than 50%  respiratory variability, suggesting right atrial pressure of 3 mmHg.   Stress Test 2020  Nuclear stress EF: 43%. The left ventricular ejection fraction is moderately decreased (30-44%).  The patient walked for 6 minutes of a standard Bruce protocol treadmill test. She achieved a peak heart rate of 162 which is 95% predicted maximal heart rate.  At peak exercise there were no ST or T wave changes to suggest ischemia.  This is a low risk study. There is no evidence of  ischemia and no evidence of previous infarction. The left ventricular systolic function is mildly depressed.     Neuro/Psych  Headaches  negative psych ROS   GI/Hepatic Neg liver ROS,GERD  ,,  Endo/Other  diabetes, Type 2Hypothyroidism    Renal/GU negative Renal ROS  negative genitourinary   Musculoskeletal  (+) Arthritis ,    Abdominal   Peds  Hematology negative hematology ROS (+)   Anesthesia Other Findings   Reproductive/Obstetrics                             Anesthesia Physical Anesthesia Plan  ASA: 3  Anesthesia Plan: General   Post-op Pain Management: Tylenol PO (pre-op)*   Induction: Intravenous  PONV Risk Score and Plan: 3 and Ondansetron, Dexamethasone and Midazolam  Airway Management Planned: LMA  Additional Equipment:   Intra-op Plan:   Post-operative Plan: Extubation in OR  Informed Consent: I have reviewed the patients History and Physical, chart, labs and discussed the procedure including the risks, benefits and alternatives for the proposed anesthesia with the patient or authorized representative who has indicated his/her understanding and acceptance.     Dental advisory given  Plan Discussed with: CRNA  Anesthesia Plan Comments:        Anesthesia Quick Evaluation

## 2023-02-26 NOTE — Discharge Instructions (Addendum)
DO NOT TAKE TYLENOL UNTIL AFTER 4PM today.  POST OPERATIVE INSTRUCTIONS  General Instructions Recovery (not bed rest) will last approximately 6 weeks Walking is encouraged, but refrain from strenuous exercise/ housework/ heavy lifting. No lifting >10lbs  Nothing in the vagina- NO intercourse, tampons or douching Bathing:  Do not submerge in water (NO swimming, bath, hot tub, etc) until after your postop visit. You can shower starting the day after surgery.  No driving until you are not taking narcotic pain medicine and until your pain is well enough controlled that you can slam on the breaks or make sudden movements if needed.   Taking your medications Please take your acetaminophen and ibuprofen on a schedule for the first 48 hours. Take 600mg  ibuprofen, then take 500mg  acetaminophen 3 hours later, then continue to alternate ibuprofen and acetaminophen. That way you are taking each type of medication every 6 hours. Take the prescribed narcotic (oxycodone, tramadol, etc) as needed, with a maximum being every 4 hours.  Take a stool softener daily to keep your stools soft and preventing you from straining. If you have diarrhea, you decrease your stool softener. This is explained more below. We have prescribed you Miralax.  Reasons to Call the Nurse (see last page for phone numbers) Heavy Bleeding (changing your pad every 1-2 hours) Persistent nausea/vomiting Fever (100.4 degrees or more) Incision problems (pus or other fluid coming out, redness, warmth, increased pain)  Things to Expect After Surgery Mild to Moderate pain is normal during the first day or two after surgery. If prescribed, take Ibuprofen or Tylenol first and use the stronger medicine for "break-through" pain. You can overlap these medicines because they work differently.   Constipation   To Prevent Constipation:  Eat a well-balanced diet including protein, grains, fresh fruit and vegetables.  Drink plenty of fluids. Walk  regularly.  Depending on specific instructions from your physician: take Miralax daily and additionally you can add a stool softener (colace/ docusate) and fiber supplement. Continue as long as you're on pain medications.   To Treat Constipation:  If you do not have a bowel movement in 2 days after surgery, you can take 2 Tbs of Milk of Magnesia 1-2 times a day until you have a bowel movement. If diarrhea occurs, decrease the amount or stop the laxative. If no results with Milk of Magnesia, you can drink a bottle of magnesium citrate which you can purchase over the counter.  Fatigue:  This is a normal response to surgery and will improve with time.  Plan frequent rest periods throughout the day.  Gas Pain:  This is very common but can also be very painful! Drink warm liquids such as herbal teas, bouillon or soup. Walking will help you pass more gas.  Mylicon or Gas-X can be taken over the counter.  Leaking Urine:  Varying amounts of leakage may occur after surgery.  This should improve with time. Your bladder needs at least 3 months to recover from surgery. If you leak after surgery, be sure to mention this to your doctor at your post-op visit. If you were taking medications for overactive bladder prior to surgery, be sure to restart the medications immediately after surgery.  Incisions: If you have incisions on your abdomen, the skin glue will dissolve on its own over time. It is ok to gently rinse with soap and water over these incisions but do not scrub.  Catheter Approximately 50% of patients are unable to urinate after surgery and need to go  home with a catheter. This allows your bladder to rest so it can return to full function. If you go home with a catheter, the office will call to set up a voiding trial a few days after surgery. For most patients, by this visit, they are able to urinate on their own. Long term catheter use is rare.   Return to Work  As work demands and recovery times vary  widely, it is hard to predict when you will want to return to work. If you have a desk job with no strenuous physical activity, and if you would like to return sooner than generally recommended, discuss this with your provider or call our office.   Post op concerns  For non-emergent issues, please call the Urogynecology Nurse. Please leave a message and someone will contact you within one business day.  You can also send a message through [facility].   AFTER HOURS (After 5:00 PM and on weekends):  For urgent matters that cannot wait until the next business day. Call our office [phone] and connect to the doctor on call.  Please reserve this for important issues.   **FOR ANY TRUE EMERGENCY ISSUES CALL 911 OR GO TO THE NEAREST EMERGENCY ROOM.** Please inform our office or the doctor on call of any emergency.     APPOINTMENTS: Call [phone]  Post Anesthesia Home Care Instructions  Activity: Get plenty of rest for the remainder of the day. A responsible adult should stay with you for 24 hours following the procedure.  For the next 24 hours, DO NOT: -Drive a car -Paediatric nurse -Drink alcoholic beverages -Take any medication unless instructed by your physician -Make any legal decisions or sign important papers.  Meals: Start with liquid foods such as gelatin or soup. Progress to regular foods as tolerated. Avoid greasy, spicy, heavy foods. If nausea and/or vomiting occur, drink only clear liquids until the nausea and/or vomiting subsides. Call your physician if vomiting continues.  Special Instructions/Symptoms: Your throat may feel dry or sore from the anesthesia or the breathing tube placed in your throat during surgery. If this causes discomfort, gargle with warm salt water. The discomfort should disappear within 24 hours.  If you had a scopolamine patch placed behind your ear for the management of post- operative nausea and/or vomiting:  1. The medication in the patch is  effective for 72 hours, after which it should be removed.  Wrap patch in a tissue and discard in the trash. Wash hands thoroughly with soap and water. 2. You may remove the patch earlier than 72 hours if you experience unpleasant side effects which may include dry mouth, dizziness or visual disturbances. 3. Avoid touching the patch. Wash your hands with soap and water after contact with the patch.

## 2023-02-26 NOTE — Transfer of Care (Signed)
Immediate Anesthesia Transfer of Care Note  Patient: [name] [name]  Procedure(s) Performed: POSTERIOR REPAIR (RECTOCELE) (Vagina )  Patient Location: PACU  Anesthesia Type:General  Level of Consciousness: awake, alert , oriented, and patient cooperative  Airway & Oxygen Therapy: Patient Spontanous Breathing and Patient connected to nasal cannula oxygen  Post-op Assessment: Report given to RN, Post -op Vital signs reviewed and stable, and Patient moving all extremities X 4  Post vital signs: Reviewed and stable  Last Vitals:  Vitals Value Taken Time  BP 128/67 02/26/23 0825  Temp 36.4 C 02/26/23 0825  Pulse 72 02/26/23 0828  Resp 18 02/26/23 0828  SpO2 100 % 02/26/23 0828  Vitals shown include unvalidated device data.  Last Pain:  Vitals:   02/26/23 0825  TempSrc:   PainSc: Asleep      Patients Stated Pain Goal: 5 ([id number])  Complications: No notable events documented.

## 2023-03-02 ENCOUNTER — Encounter (HOSPITAL_BASED_OUTPATIENT_CLINIC_OR_DEPARTMENT_OTHER): Payer: Self-pay | Admitting: Obstetrics and Gynecology

## 2023-03-02 DIAGNOSIS — Z419 Encounter for procedure for purposes other than remedying health state, unspecified: Secondary | ICD-10-CM | POA: Diagnosis not present

## 2023-03-02 NOTE — Telephone Encounter (Signed)
Called and spoke with patient   Patient reports her pain is a 6/10. She has been taking pain Oxycodone 15mg  every 4 hours per her pain contract.  Bleeding is well controlled and is small streaks. No heavy clots or heavy bleeding.   Patient has been able to have a bowel movement.   Patient reports she has been urinating normally.   Endorses a normal bowel movement.  Patient reports she has the office number and will call if she needs anything. Overall feels she is doing well.

## 2023-03-10 ENCOUNTER — Encounter: Payer: Medicaid Other | Admitting: Student

## 2023-03-24 ENCOUNTER — Ambulatory Visit: Payer: Medicaid Other | Admitting: Student

## 2023-03-24 VITALS — BP 136/81 | HR 79 | Temp 97.8°F | Wt 195.2 lb

## 2023-03-24 DIAGNOSIS — Z794 Long term (current) use of insulin: Secondary | ICD-10-CM | POA: Diagnosis not present

## 2023-03-24 DIAGNOSIS — N816 Rectocele: Secondary | ICD-10-CM

## 2023-03-24 DIAGNOSIS — E119 Type 2 diabetes mellitus without complications: Secondary | ICD-10-CM

## 2023-03-24 DIAGNOSIS — I10 Essential (primary) hypertension: Secondary | ICD-10-CM

## 2023-03-24 LAB — POCT GLYCOSYLATED HEMOGLOBIN (HGB A1C): Hemoglobin A1C: 6.3 % — AB (ref 4.0–5.6)

## 2023-03-24 LAB — GLUCOSE, CAPILLARY: Glucose-Capillary: 98 mg/dL (ref 70–99)

## 2023-03-24 NOTE — Assessment & Plan Note (Addendum)
Patient with previously noted rectocele, now s/p posterior repair with Dr. Florian Buff on 02/26/2023. She is doing well after surgical repair, still experiencing some pain around surgical site. She has oxycodone for pain relief when pain is severe. Has follow up in 3 weeks for removal of stitches.

## 2023-03-24 NOTE — Assessment & Plan Note (Signed)
Patient currently on metformin  BID, jardiance  daily, and trulicity 0.75mg  weekly. A1c was 6.2% about 3 months ago, repeat A1c today of 6.3%. She remains well controlled. Tolerating her medications fine at this time. Repeat A1c in 3 months and if remains well controlled, can consider lengthening time between checks to every 6 months. Foot exam performed today. She did obtain her diabetic eye exam with Dr. Dione Booze in Feb 2024.  Plan: -continue metformin, jardiance, trulicity -foot exam done today -repeat A1c in 3 months

## 2023-03-24 NOTE — Patient Instructions (Signed)
[name] [name],  It was a pleasure seeing you in the clinic today.   Your A1c looks great! It is 6.3% today so keep up the good work! You got your foot exam done today. At the next visit, we can get your pap smear done as this is due. Please come back to see [name] in 2 months for your next visit.  Please call our clinic at [phone] if you have any questions or concerns. The best time to call is Monday-Friday from 9am-4pm, but there is someone available 24/7 at the same number. If you need medication refills, please notify your pharmacy one week in advance and they will send [name] a request.   Thank you for letting us take part in your care. We look forward to seeing you next time!

## 2023-03-24 NOTE — Assessment & Plan Note (Signed)
Patient living with HTN, currently on losartan  daily, norvasc  qhs, coreg 6.25mg  BID, and jardiance  daily. BP is slightly elevated in clinic today at 136/81, likely from pain around surgical site after recent posterior repair of rectocele. Will avoid any medication changes at this time. Follow up in 2 months for repeat BP check.

## 2023-03-24 NOTE — Progress Notes (Signed)
CC: f/u HTN, T2DM  HPI:  [name] is a 57 y.o. female with history listed below presenting to the [facility] for f/u HTN, T2DM. Please see individualized problem based charting for full HPI.  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   followed by pcp;    s/p total thyroidecotmy ,  hurthle cell caricnoma,  no radiation/ chemo   Nephrolithiasis    02-23-2023  currently has nonobstuctive stones   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   neurologist--- dr Lucia Gaskins;   s/p suboccipital craniotomy resection tumor 10-30-2000 by dr Newell Coral ;  intermittant symptoms dizziness worsening nocturnal headaches/ migraines;  treated with methotrate   Nonischemic cardiomyopathy    followed by cardiology---   cardiac MRI 01-27-2020 ef 44%;  echo 08-06-2022  ef 60%   Osteoporosis    Peripheral neuropathy    per pt intermittantly feet   Rectocele    Stress incontinence due to pelvic organ prolapse    Type 2 diabetes mellitus    followed by pcp   (02-24-2023  per pt does not check blood sugar)   Wears contact lenses     Review of Systems:  Negative aside from that listed in individualized problem based charting.  Physical Exam:  Vitals:   03/24/23 1107  BP: 136/81  Pulse: 79  Temp: 97.8 F (36.6 C)  TempSrc: Oral  SpO2: 100%  Weight: 195 lb 3.2 oz (88.5 kg)   Physical Exam Constitutional:      Appearance: Normal appearance. She is normal weight. She is not ill-appearing.  HENT:      Mouth/Throat:     Mouth: Mucous membranes are moist.     Pharynx: Oropharynx is clear. No oropharyngeal exudate.  Eyes:     General: No scleral icterus.    Extraocular Movements: Extraocular movements intact.     Conjunctiva/sclera: Conjunctivae normal.     Pupils: Pupils are equal, round, and reactive to light.  Cardiovascular:     Rate and Rhythm: Normal rate and regular rhythm.     Heart sounds: Normal heart sounds. No murmur heard.    No friction rub. No gallop.  Pulmonary:     Effort: Pulmonary effort is normal.     Breath sounds: Normal breath sounds. No wheezing, rhonchi or rales.  Abdominal:     General: Bowel sounds are normal. There is no distension.     Palpations: Abdomen is soft.     Tenderness: There is no abdominal tenderness. There is no guarding or rebound.  Musculoskeletal:        General: No swelling. Normal range of motion.  Skin:    General: Skin is warm and dry.  Neurological:     General: No focal deficit present.     Mental Status: She is alert and oriented to person, place, and time.  Psychiatric:        Mood and Affect: Mood normal.  Behavior: Behavior normal.      Assessment & Plan:   See Encounters Tab for problem based charting.  Patient discussed with Dr. Heber Camp Ver[place]

## 2023-03-31 DIAGNOSIS — M5136 Other intervertebral disc degeneration, lumbar region: Secondary | ICD-10-CM | POA: Diagnosis not present

## 2023-03-31 DIAGNOSIS — G8929 Other chronic pain: Secondary | ICD-10-CM | POA: Diagnosis not present

## 2023-03-31 DIAGNOSIS — M79662 Pain in left lower leg: Secondary | ICD-10-CM | POA: Diagnosis not present

## 2023-03-31 DIAGNOSIS — M961 Postlaminectomy syndrome, not elsewhere classified: Secondary | ICD-10-CM | POA: Diagnosis not present

## 2023-03-31 DIAGNOSIS — G894 Chronic pain syndrome: Secondary | ICD-10-CM | POA: Diagnosis not present

## 2023-03-31 DIAGNOSIS — M25512 Pain in left shoulder: Secondary | ICD-10-CM | POA: Diagnosis not present

## 2023-03-31 NOTE — Progress Notes (Signed)
Internal Medicine Clinic Attending  Case discussed with the resident at the time of the visit.  We reviewed the resident's history and exam and pertinent patient test results.  I agree with the assessment, diagnosis, and plan of care documented in the resident's note.  

## 2023-04-01 ENCOUNTER — Encounter: Payer: Self-pay | Admitting: Obstetrics and Gynecology

## 2023-04-01 ENCOUNTER — Ambulatory Visit (INDEPENDENT_AMBULATORY_CARE_PROVIDER_SITE_OTHER): Payer: Medicaid Other | Admitting: Obstetrics and Gynecology

## 2023-04-01 VITALS — BP 120/74 | HR 105

## 2023-04-01 DIAGNOSIS — R110 Nausea: Secondary | ICD-10-CM

## 2023-04-01 DIAGNOSIS — M62838 Other muscle spasm: Secondary | ICD-10-CM

## 2023-04-01 DIAGNOSIS — Z419 Encounter for procedure for purposes other than remedying health state, unspecified: Secondary | ICD-10-CM | POA: Diagnosis not present

## 2023-04-01 MED ORDER — CYCLOBENZAPRINE HCL 5 MG PO TABS: 5.0000 mg | ORAL_TABLET | Freq: Three times a day (TID) | ORAL | 0 refills | Status: DC | PRN

## 2023-04-01 MED ORDER — ONDANSETRON HCL 4 MG PO TABS: 4.0000 mg | ORAL_TABLET | Freq: Three times a day (TID) | ORAL | 0 refills | Status: DC | PRN

## 2023-04-01 NOTE — Progress Notes (Signed)
[facility]  Date of Visit: 04/01/2023  History of Present Illness: [name] [name] is a 57 y.o. female scheduled today for a post-operative visit.   Surgery: s/p posterior repair on 02/26/23  She passed her postoperative void trial.   Postoperative course has been uncomplicated.   Today she reports she has some nausea from the discomfort in her pelvis. Pain extends from buttock up to low back.   UTI in the last 6 weeks? No  Pain? No  She has returned to her normal activity (except for postop restrictions) Vaginal bulge? No  Stress incontinence: No  Urgency/frequency: No  Urge incontinence: No  Voiding dysfunction: No  Bowel issues: No   Subjective Success: Do you usually have a bulge or something falling out that you can see or feel in the vaginal area? No  Retreatment Success: Any retreatment with surgery or pessary for any compartment? No    Medications: She has a current medication list which includes the following prescription(s): accu-chek guide, albuterol, amlodipine, apple cider vinegar, aspirin, atorvastatin, b-d ultrafine iii short pen, blood glucose meter kit and supplies, calcium & magnesium carbonates, carvedilol, cyclosporine, trulicity, fluticasone, folic acid, jardiance, levothyroxine, losartan, metamucil fiber, metformin, methotrexate, multivitamin with minerals, ondansetron, ondansetron, oxycodone, polyethylene glycol powder, and cyclobenzaprine.   Allergies: Patient is allergic to lisinopril.   Physical Exam: BP 120/74   Pulse (!) 105   LMP 12/03/2011    Pelvic Examination: Vagina: Incisions healing well. Sutures are present at incision line and there is not granulation tissue. + tenderness on palpation of pelvic floor muscles. No pelvic masses.     POP-Q: POP-Q  -3                                            Aa   -3                                           Ba  -7                                              C   2                                             Gh  3.5                                            Pb  7                                            tvl   -3  Ap  -3                                            Bp                                                 D     ---------------------------------------------------------  Assessment and Plan:  1. Nausea   2. Levator spasm    - Well healed from the surgery overall. Has some pelvic floor muscle spasm which is likely contributing to the pain around her rectum. Has attended pelvic PT previously for constipation and dyssyenergia and she is interested in attending again. Will also prescribe flexeril 5mg  up to TID for muscle spasm. She was given an Rx for zofran.  - Can resume regular activity including exercise and intercourse,  if desired.  - Discussed avoidance of heavy lifting and straining long term to reduce the risk of recurrence. We discussed importance of preventing constipation.   Return as needed  Marguerita Beards, MD

## 2023-04-10 ENCOUNTER — Other Ambulatory Visit: Payer: Self-pay | Admitting: Internal Medicine

## 2023-04-10 DIAGNOSIS — E119 Type 2 diabetes mellitus without complications: Secondary | ICD-10-CM

## 2023-04-10 DIAGNOSIS — I10 Essential (primary) hypertension: Secondary | ICD-10-CM

## 2023-04-13 ENCOUNTER — Other Ambulatory Visit (HOSPITAL_COMMUNITY): Payer: Self-pay

## 2023-04-13 ENCOUNTER — Telehealth: Payer: Self-pay

## 2023-04-13 ENCOUNTER — Encounter: Payer: Self-pay | Admitting: *Deleted

## 2023-04-13 NOTE — Telephone Encounter (Signed)
Pharmacy Patient Advocate Encounter  Received notification from [facility] that the request for prior authorization for Emgality has been denied due to See below.       Please be advised we currently do not have a Pharmacist to review denials, therefore you will need to process appeals accordingly as needed. Thanks for your support at this time.   You may call  or fax , to appeal.  Denial letter has been placed in the chart.

## 2023-04-13 NOTE — Telephone Encounter (Signed)
I sent patient a mychart message asking her if she's utilizing any prophylactic intervention modalities.

## 2023-04-13 NOTE — Telephone Encounter (Signed)
Patient Advocate Encounter   Received notification from [facility] that prior authorization is required for Emgality 120MG /ML auto-injectors (migraine)   Submitted: 04-13-2023 Key [id number]  Status is pending

## 2023-04-15 ENCOUNTER — Other Ambulatory Visit: Payer: Self-pay

## 2023-04-15 DIAGNOSIS — E119 Type 2 diabetes mellitus without complications: Secondary | ICD-10-CM

## 2023-04-15 DIAGNOSIS — I10 Essential (primary) hypertension: Secondary | ICD-10-CM

## 2023-04-16 ENCOUNTER — Other Ambulatory Visit: Payer: Self-pay | Admitting: Student

## 2023-04-16 ENCOUNTER — Other Ambulatory Visit: Payer: Self-pay | Admitting: *Deleted

## 2023-04-16 DIAGNOSIS — I10 Essential (primary) hypertension: Secondary | ICD-10-CM

## 2023-04-17 NOTE — Telephone Encounter (Signed)
Call to patient scheduled for lab only appointment on 04/21/2023 at 11 AM.

## 2023-04-21 ENCOUNTER — Other Ambulatory Visit (INDEPENDENT_AMBULATORY_CARE_PROVIDER_SITE_OTHER): Payer: Medicaid Other

## 2023-04-21 DIAGNOSIS — I10 Essential (primary) hypertension: Secondary | ICD-10-CM

## 2023-04-21 NOTE — Telephone Encounter (Signed)
Patient read the mychart message but hasn't responded. I called the patient and LVM (ok per DPR) advising patient that Emgality has been denied by insurance because they need information on what patient is doing (non-medication) to prevent migraines. This can be lifestyle modifications, behavior therapy, physical therapy, diet, exercise, etc. I asked the pt to call us back (left office number) or send a MyChart message.   If patient doesn't respond this can be discussed when she comes in for her visit w/ [name] NP on 05/05/23.

## 2023-04-22 LAB — BMP8+ANION GAP
Anion Gap: 21 mmol/L — ABNORMAL HIGH (ref 10.0–18.0)
BUN/Creatinine Ratio: 17 (ref 9–23)
BUN: 13 mg/dL (ref 6–24)
CO2: 21 mmol/L (ref 20–29)
Calcium: 9.4 mg/dL (ref 8.7–10.2)
Chloride: 100 mmol/L (ref 96–106)
Creatinine, Ser: 0.78 mg/dL (ref 0.57–1.00)
Glucose: 101 mg/dL — ABNORMAL HIGH (ref 70–99)
Potassium: 4.3 mmol/L (ref 3.5–5.2)
Sodium: 142 mmol/L (ref 134–144)
eGFR: 90 mL/min/{1.73_m2} (ref >59)

## 2023-04-22 MED ORDER — METFORMIN HCL 1000 MG PO TABS: 1000.0000 mg | ORAL_TABLET | Freq: Two times a day (BID) | ORAL | 3 refills | Status: DC

## 2023-04-22 MED ORDER — LOSARTAN POTASSIUM 50 MG PO TABS: 50.0000 mg | ORAL_TABLET | Freq: Every day | ORAL | 3 refills | Status: DC

## 2023-04-23 ENCOUNTER — Other Ambulatory Visit: Payer: Self-pay | Admitting: Student

## 2023-04-24 ENCOUNTER — Other Ambulatory Visit: Payer: Self-pay | Admitting: Student

## 2023-04-24 DIAGNOSIS — E119 Type 2 diabetes mellitus without complications: Secondary | ICD-10-CM

## 2023-04-26 ENCOUNTER — Other Ambulatory Visit: Payer: Self-pay | Admitting: Internal Medicine

## 2023-04-26 DIAGNOSIS — E890 Postprocedural hypothyroidism: Secondary | ICD-10-CM

## 2023-04-28 DIAGNOSIS — G8929 Other chronic pain: Secondary | ICD-10-CM | POA: Diagnosis not present

## 2023-04-28 DIAGNOSIS — M25512 Pain in left shoulder: Secondary | ICD-10-CM | POA: Diagnosis not present

## 2023-04-28 DIAGNOSIS — M5136 Other intervertebral disc degeneration, lumbar region: Secondary | ICD-10-CM | POA: Diagnosis not present

## 2023-04-28 DIAGNOSIS — G894 Chronic pain syndrome: Secondary | ICD-10-CM | POA: Diagnosis not present

## 2023-04-28 DIAGNOSIS — M961 Postlaminectomy syndrome, not elsewhere classified: Secondary | ICD-10-CM | POA: Diagnosis not present

## 2023-04-28 DIAGNOSIS — M79662 Pain in left lower leg: Secondary | ICD-10-CM | POA: Diagnosis not present

## 2023-05-02 DIAGNOSIS — Z419 Encounter for procedure for purposes other than remedying health state, unspecified: Secondary | ICD-10-CM | POA: Diagnosis not present

## 2023-05-05 ENCOUNTER — Encounter: Payer: Self-pay | Admitting: Neurology

## 2023-05-05 ENCOUNTER — Telehealth: Payer: Self-pay | Admitting: Neurology

## 2023-05-05 ENCOUNTER — Ambulatory Visit: Payer: Medicaid Other | Admitting: Neurology

## 2023-05-05 VITALS — BP 105/70 | HR 91 | Ht 68.0 in | Wt 191.0 lb

## 2023-05-05 DIAGNOSIS — D8689 Sarcoidosis of other sites: Secondary | ICD-10-CM | POA: Diagnosis not present

## 2023-05-05 DIAGNOSIS — G43709 Chronic migraine without aura, not intractable, without status migrainosus: Secondary | ICD-10-CM

## 2023-05-05 MED ORDER — EMGALITY 120 MG/ML ~~LOC~~ SOAJ: 240.0000 mg | Freq: Once | SUBCUTANEOUS | 0 refills | Status: AC

## 2023-05-05 MED ORDER — EMGALITY 120 MG/ML ~~LOC~~ SOAJ: 120.0000 mg | SUBCUTANEOUS | 11 refills | Status: DC

## 2023-05-05 NOTE — Telephone Encounter (Signed)
Dr. Lucia Gaskins ordered MRI brain in Feb 2024, patient claims didn't know about it. Can you assist her in getting this setup? Let me know if you need new order. Thanks

## 2023-05-05 NOTE — Patient Instructions (Signed)
Start Emgality for migraine prevention, 1st month is 2 injection as loading dose followed 30 days later by 1 injection for monthly maintenance to continue on   Get MRI brain completed, someone will contact you

## 2023-05-05 NOTE — Progress Notes (Signed)
Patient: [name] [name] Date of Birth: [birth year]  Reason for Visit: Follow up History from: Patient Primary Neurologist: Dr. Lucia Gaskins   ASSESSMENT AND PLAN 57 y.o. year old female   1.  Neurosarcoidosis 2.  Chronic migraine headache -Reorder Emgality for migraine prevention -MRI of the brain with and without contrast was ordered at last visit with Dr. Lucia Gaskins but was not completed, will reach out to Tori about scheduling (04/21/23 creatinine 0.78) -Tried and failed topiramate, aimovig(she has constipation, contraindicated), amitriptyline, propranolol, imitrex, maxalt, amlodipine. Carvedilol, cymbalta, gabapentin, lisinopril,losartan, lyrica, ajovy.  She declines Botox -For migraine management has tried practicing good sleep habits, exercise, avoiding triggers, drinking water for migraine prevention -Follow-up 6 months or sooner if needed  Meds ordered this encounter  Medications   Galcanezumab-gnlm (EMGALITY) 120 MG/ML SOAJ    Sig: Inject 240 mg into the skin once for 1 dose.    Dispense:  2 mL    Refill:  0    Fill this 1st at loading dose   Galcanezumab-gnlm (EMGALITY) 120 MG/ML SOAJ    Sig: Inject 120 mg into the skin every 30 (thirty) days.    Dispense:  1.12 mL    Refill:  11    Fill this 2nd as maintenance   IMPRESSION: This MRI of the brain with and without contrast shows the following: 1.   No acute findings.  2.   Two punctate T2/FLAIR hyperintense foci in the subcortical white matter of the frontal lobes.  This is a nonspecific finding most consistent with very minimal chronic microvascular ischemic change or minimal sequela of migraine headache.  The focus on the left was not present on the 2020 MRI 3.   Sequela of remote posterior fossa surgery, unchanged in appearance compared to the previous MRI. 4.   Normal enhancement pattern.   HISTORY  Dr. Lucia Gaskins 01/27/22: [name] [name] is an 57 y.o. female   She has 2 really bad nose bleeds and it was bad. Her nose scabs  and having a lot of sinus issues. She has big scabs. Dr, Anne Hahn used to give her OTC steroid spray. She says she is stable with her headaches, she goes to pain management for the back, she had surgery on her back, her headaches can be severe, at least 20 headache days a months, they can be mild or range to severe. She has 8 severe headaches a month for > 1 year, she gets dizzy by looking, the light bother her, nausea, movement makes it worse, no vomiting, if she bends over it hurts, positional, she can wake with them in the morning and can last 24 hours. She takes oxycodone for the back pain. She tried Aimovig, we discussed botox, discussed Ajovy. Vision changes. She had a partial hysterectomy.    MRI brain IMPRESSION: personally reviewed and agree (additional 5 minutes in addition to visit) 1. Stable postoperative changes in the posterior fossa. 2. No evidence of acute intracranial abnormality or active neurosarcoidosis. 3. Right maxillary sinus fluid and mucosal thickening, correlate for acute sinusitis.  July 30, 2022 SS: MRI of the brain with and without contrast in March 2023 showed age-related findings. Reports 3 migraines a week, is about normal for her. Never got the Ajovy or Ubrelvy. For headache, will take oxycodone, prescribed for back pain, it does help. Not interested in botox therapy, worries about needles. Not interested in sleep study, snores, had sleep consult with Dr. Frances Furbish in 2022. We talked about methotrexate, Dr. Anne Hahn mentioned she could  come off, has decided to stay on low dose. PCP refills now. Thinks she saw ENT, doesn't remember the conclusion, no more nosebleeds.   IMPRESSION: This MRI of the brain with and without contrast shows the following: 1.   No acute findings.  2.   Two punctate T2/FLAIR hyperintense foci in the subcortical white matter of the frontal lobes.  This is a nonspecific finding most consistent with very minimal chronic microvascular ischemic change or  minimal sequela of migraine headache.  The focus on the left was not present on the 2020 MRI 3.   Sequela of remote posterior fossa surgery, unchanged in appearance compared to the previous MRI. 4.   Normal enhancement pattern.    Dr.Ahern 01/26/2023: last seen 6 months ago, Ajovy was reordered, worked well at first and thn work at all. Bernita Raisin was ordered and denied. She already tried Aimovig and did not help. Yesterday she had the worst headache. In the back of the head. And in the shoulders, migraines behind the right eye, pulsating/pounding/throbbing, nausea, light and sound sensitivity, having 12 migraine days a month and >15 total headache days a month. More dizziness, pain in the back of the head, nausea, vomiting, occipital headaches, nocturnal/positional headaches. Ongoing for > 6 months, moderate to severe migraines. We discussed options today. She endorsed compliance for at least 6 months with ajovy.    - start Emgality, discussed compliance will check in may - failed topiramate, aimovig(she has constipation, contraindicated), amitriptyline, propranolol, imitrex, maxalt, amlodipine. Carvedilol, cymbalta, gabapentin, lisinopril,losartan, lyrica,  Update May 05, 2023 SS: Feels migraines are about the same. At last visit started [facility], declined Botox.  MRI of the brain was ordered for surveillance of neurosarcoid, was not completed, she didn't remember it.  Emgality was denied due to insurance needing to know what nonmedication modalities she was utilizing for migraine prevention. Trouble sleeping. Right now, 2-3 migraines a week, in a month 3 migraines are severe. Has 1-2 mild headaches a week. Takes oxycodone for back pain, helps headache, uses cold rag, lay in dark room for migraine. She tries to stay hydrated, get good sleep, walks for exercise.   REVIEW OF SYSTEMS: Out of a complete 14 system review of symptoms, the patient complains only of the following symptoms, and all other reviewed  systems are negative.  See HPI  ALLERGIES: Allergies  Allergen Reactions   Lisinopril Cough    HOME MEDICATIONS: Outpatient Medications Prior to Visit  Medication Sig Dispense Refill   ACCU-CHEK GUIDE test strip USE AS INSTRUCTED TO CHECK BLOOD SUGAR UP TO 1 TIME A DAY 50 strip 7   albuterol (VENTOLIN HFA) 108 (90 Base) MCG/ACT inhaler Inhale 2 puffs into the lungs every 6 (six) hours as needed for wheezing or shortness of breath. 18 g 1   amLODipine (NORVASC) 5 MG tablet Take 1 tablet by mouth at bedtime.     APPLE CIDER VINEGAR PO Take by mouth daily. chew     aspirin 81 MG chewable tablet Chew 81 mg by mouth daily.     atorvastatin (LIPITOR) 20 MG tablet TAKE 1 TABLET BY MOUTH EVERY DAY (Patient taking differently: Take 20 mg by mouth at bedtime.) 90 tablet 3   B-D ULTRAFINE III SHORT PEN 31G X 8 MM MISC USE TO INJECT INSULIN 1 TIME DAILY DIAG CODE E11.9 INSULIN DEPENDENT 100 each 4   blood glucose meter kit and supplies KIT Dispense based on patient and insurance preference. Use up to four times daily as directed. (FOR  ICD-9 250.00, 250.01). 1 each 0   carvedilol (COREG) 6.25 MG tablet Take 1 tablet (6.25 mg total) by mouth 2 (two) times daily with a meal. 180 tablet 3   cyclobenzaprine (FLEXERIL) 5 MG tablet Take 1 tablet (5 mg total) by mouth 3 (three) times daily as needed for muscle spasms. 60 tablet 0   cycloSPORINE (RESTASIS) 0.05 % ophthalmic emulsion Place 1 drop into both eyes 2 (two) times daily.     Dulaglutide (TRULICITY) 0.75 MG/0.5ML SOPN Inject 0.75 mg into the skin once a week. Wednesday's 6 mL 3   fluticasone (FLONASE) 50 MCG/ACT nasal spray SPRAY 1 SPRAY INTO BOTH NOSTRILS DAILY. (Patient taking differently: Place 1 spray into both nostrils at bedtime.) 16 mL 5   folic acid (FOLVITE) 1 MG tablet TAKE 1 TABLET BY MOUTH EVERY DAY (Patient taking differently: Take 1 mg by mouth at bedtime.) 90 tablet 3   JARDIANCE 10 MG TABS tablet TAKE 1 TABLET BY MOUTH EVERY DAY 90  tablet 3   levothyroxine (SYNTHROID) 137 MCG tablet Take 1 tablet (137 mcg total) by mouth daily before breakfast. TAKE 1 TABLET BY MOUTH EVERY DAY BEFORE BREAKFAST 90 tablet 3   losartan (COZAAR) 50 MG tablet Take 1 tablet (50 mg total) by mouth daily. 90 tablet 3   Metamucil Fiber CHEW Chew by mouth 2 (two) times daily.     metFORMIN (GLUCOPHAGE) 1000 MG tablet Take 1 tablet (1,000 mg total) by mouth 2 (two) times daily with a meal. 180 tablet 3   methotrexate (RHEUMATREX) 2.5 MG tablet TAKE 1 TABLET (2.5 MG TOTAL) BY MOUTH ONCE A WEEK. CAUTION:CHEMOTHERAPY. PROTECT FROM LIGHT. (Patient taking differently: Take 2.5 mg by mouth once a week. Saturday's) 12 tablet 3   Multiple Vitamin (MULTIVITAMIN WITH MINERALS) TABS tablet Take 1 tablet by mouth every evening.      ondansetron (ZOFRAN) 4 MG tablet Take 1 tablet (4 mg total) by mouth every 8 (eight) hours as needed for nausea or vomiting. 30 tablet 0   ondansetron (ZOFRAN-ODT) 4 MG disintegrating tablet Take 4 mg by mouth every 8 (eight) hours as needed for nausea.     oxyCODONE (ROXICODONE) 15 MG immediate release tablet Take 1-2 tablets by mouth every 6 (six) hours.     polyethylene glycol powder (GLYCOLAX/MIRALAX) 17 GM/SCOOP powder Take 17 g by mouth daily. Drink 17g (1 scoop) dissolved in water per day. (Patient taking differently: Take 17 g by mouth 2 (two) times daily as needed. Drink 17g (1 scoop) dissolved in water per day.) 255 g 0   Calcium & Magnesium Carbonates (MYLANTA PO) Take by mouth as needed (heartburn).     No facility-administered medications prior to visit.    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   followed by pcp;    s/p total thyroidecotmy ,  hurthle cell caricnoma,  no radiation/ chemo   Nephrolithiasis  02-23-2023  currently has nonobstuctive stones   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   neurologist--- dr Lucia Gaskins;   s/p suboccipital craniotomy resection tumor 10-30-2000 by dr Newell Coral ;  intermittant symptoms dizziness worsening nocturnal headaches/ migraines;  treated with methotrate   Nonischemic cardiomyopathy (HCC)    followed by cardiology---   cardiac MRI 01-27-2020 ef 44%;  echo 08-06-2022  ef 60%   Osteoporosis    Peripheral neuropathy    per pt intermittantly feet   Rectocele    Stress incontinence due to pelvic organ prolapse    Type 2 diabetes mellitus (HCC)    followed by pcp   (02-24-2023  per pt does not check blood sugar)   Wears contact lenses     PAST SURGICAL HISTORY: Past Surgical History:  Procedure Laterality Date   BREAST EXCISIONAL BIOPSY Bilateral    yrs ago;  per pt benign   CESAREAN SECTION     x5   last one 1993   CRANIOTOMY POSTERIOR FOSSA  10/30/2000   @[facility]  by dr Newell Coral;   SUBOCCIPITAL CRANIOTOMY AND RESECTION OF TUMOR POSTERIOR FOSSA MASS   CYSTOSCOPY WITH RETROGRADE PYELOGRAM, URETEROSCOPY AND STENT PLACEMENT Left 08/15/2011   @[facility]  by dr Brunilda Payor   CYSTOSCOPY/RETROGRADE/URETEROSCOPY/STONE EXTRACTION WITH BASKET  03/13/2006   @[facility]  by dr Brunilda Payor   HYSTEROSCOPY W/ ENDOMETRIAL ABLATION  07/28/2007   @[facility]   by dr Gaynell Face;   novasure ablation   LUMBAR LAMINECTOMY/DECOMPRESSION MICRODISCECTOMY Left 08/20/2016   Procedure: Left Lumbar four-five Microdiskectomy;  Surgeon: Hilda Lias, MD;  Location: [facility];  Service: Neurosurgery;  Laterality: Left;   RECTOCELE REPAIR N/A 02/26/2023   Procedure: POSTERIOR REPAIR (RECTOCELE);  Surgeon:  Marguerita Beards, MD;  Location: [facility];  Service: Gynecology;  Laterality: N/A;  Total time requested is 1 hour   ROTATOR CUFF REPAIR Right 07/2013   TOTAL ABDOMINAL HYSTERECTOMY  12/2021   @[facility] ;   w/  bilateral salpingectomy and c/s scar revision   TOTAL THYROIDECTOMY Bilateral 12/09/2010   @[facility]  by dr Derrell Lolling    FAMILY HISTORY: Family History  Problem Relation Age of Onset   Heart disease Mother        questionable CAD and arrythmias    Cancer Mother        Breast cancer   Breast cancer Mother 25   Venous thrombosis Sister    Diabetes Sister    Heart disease Sister    Diabetes Brother    Heart disease Brother    Cancer Maternal Aunt    Breast cancer Maternal Aunt    Venous thrombosis Sister    Diabetes Sister    Heart disease Sister     SOCIAL HISTORY: Social History   Socioeconomic History   Marital status: Married    Spouse name: Stephenie Acres   Number of children: 4   Years of education: 12   Highest education level: Not on file  Occupational History    Employer: UNEMPLOYED  Tobacco Use   Smoking status: Never   Smokeless tobacco: Never  Vaping Use   Vaping Use: Never used  Substance and Sexual Activity   Alcohol use: No    Alcohol/week: 0.0 standard drinks of alcohol   Drug use: Never   Sexual activity: Not on file    Comment: hysterectomy 2013  Other Topics Concern   Not on file  Social History Narrative   Lives in [place] alone    Drinks very little caffeine   Patient is right handed.  Social Determinants of Health   Financial Resource Strain: Low Risk  (12/09/2022)   Overall Financial Resource Strain (CARDIA)    Difficulty of Paying Living Expenses: Not hard at all  Food Insecurity: No Food Insecurity (12/09/2022)   Hunger Vital Sign    Worried About Running Out of Food in the Last Year: Never true    Ran Out of Food in the Last Year: Never true  Transportation Needs: No Transportation Needs (12/09/2022)   PRAPARE -  Administrator, Civil Service (Medical): No    Lack of Transportation (Non-Medical): No  Physical Activity: Not on file  Stress: Not on file  Social Connections: Moderately Integrated (12/09/2022)   Social Connection and Isolation Panel [NHANES]    Frequency of Communication with Friends and Family: More than three times a week    Frequency of Social Gatherings with Friends and Family: Three times a week    Attends Religious Services: 1 to 4 times per year    Active Member of Clubs or Organizations: No    Attends Banker Meetings: 1 to 4 times per year    Marital Status: Separated  Intimate Partner Violence: Not At Risk (12/09/2022)   Humiliation, Afraid, Rape, and Kick questionnaire    Fear of Current or Ex-Partner: No    Emotionally Abused: No    Physically Abused: No    Sexually Abused: No    PHYSICAL EXAM  Vitals:   05/05/23 1544  BP: 105/70  Pulse: 91  Weight: 191 lb (86.6 kg)  Height: 5\' 8"  (1.727 m)    Body mass index is 29.04 kg/m.  Generalized: Well developed, in no acute distress  Neurological examination  Mentation: Alert oriented to time, place, history taking. Follows all commands speech and language fluent Cranial nerve II-XII: Pupils were equal round reactive to light. Extraocular movements were full, visual field were full on confrontational test. Facial sensation and strength were normal. Head turning and shoulder shrug  were normal and symmetric. Motor: The motor testing reveals 5 over 5 strength of all 4 extremities. Good symmetric motor tone is noted throughout.  Sensory: Sensory testing is intact to soft touch on all 4 extremities. No evidence of extinction is noted.  Coordination: Cerebellar testing reveals good finger-nose-finger and heel-to-shin bilaterally.  Gait and station: Gait is normal.  Reflexes: Deep tendon reflexes are symmetric and normal bilaterally.   DIAGNOSTIC DATA (LABS, IMAGING, TESTING) - I reviewed patient  records, labs, notes, testing and imaging myself where available.  Lab Results  Component Value Date   WBC 5.6 01/26/2023   HGB 12.6 02/26/2023   HCT 37.0 02/26/2023   MCV 85 01/26/2023   PLT 344 01/26/2023      Component Value Date/Time   NA 142 04/21/2023 1106   K 4.3 04/21/2023 1106   CL 100 04/21/2023 1106   CO2 21 04/21/2023 1106   GLUCOSE 101 (H) 04/21/2023 1106   GLUCOSE 99 02/26/2023 0606   BUN 13 04/21/2023 1106   CREATININE 0.78 04/21/2023 1106   CREATININE 0.84 06/29/2014 1221   CALCIUM 9.4 04/21/2023 1106   PROT 7.0 01/26/2023 1400   ALBUMIN 4.2 01/26/2023 1400   AST 20 01/26/2023 1400   ALT 21 01/26/2023 1400   ALKPHOS 83 01/26/2023 1400   BILITOT 0.2 01/26/2023 1400   GFRNONAA >60 06/07/2021 2122   GFRNONAA 84 06/29/2014 1221   GFRAA 101 05/09/2020 0941   GFRAA >89 06/29/2014 1221   Lab Results  Component Value Date  CHOL 125 06/16/2022   HDL 48 06/16/2022   LDLCALC 65 06/16/2022   TRIG 55 06/16/2022   CHOLHDL 2.6 06/16/2022   Lab Results  Component Value Date   HGBA1C 6.3 (A) 03/24/2023   Lab Results  Component Value Date   VITAMINB12 793 10/29/2006   Lab Results  Component Value Date   TSH 1.660 01/26/2023    Margie Ege, AGNP-C, DNP 05/05/2023, 4:14 PM [facility] [address] [place], [place] [address] [phone]

## 2023-05-06 NOTE — Telephone Encounter (Signed)
I submitted a new authorization to her insurance and it is pending-I had to fax the office notes.

## 2023-05-13 ENCOUNTER — Telehealth: Payer: Self-pay | Admitting: Neurology

## 2023-05-13 NOTE — Telephone Encounter (Signed)
[employer] medicaid Berkley Harvey: [id number] exp. 05/06/2023-07/05/2023 sent to GI [phone]

## 2023-05-20 ENCOUNTER — Other Ambulatory Visit (HOSPITAL_COMMUNITY): Payer: Self-pay

## 2023-05-21 ENCOUNTER — Other Ambulatory Visit: Payer: Self-pay | Admitting: Student

## 2023-05-21 DIAGNOSIS — Z1231 Encounter for screening mammogram for malignant neoplasm of breast: Secondary | ICD-10-CM

## 2023-05-25 ENCOUNTER — Encounter: Payer: Self-pay | Admitting: Student

## 2023-05-25 ENCOUNTER — Other Ambulatory Visit: Payer: Self-pay | Admitting: Student

## 2023-05-25 ENCOUNTER — Ambulatory Visit: Payer: Medicaid Other | Admitting: Student

## 2023-05-25 DIAGNOSIS — E119 Type 2 diabetes mellitus without complications: Secondary | ICD-10-CM

## 2023-05-25 DIAGNOSIS — I10 Essential (primary) hypertension: Secondary | ICD-10-CM

## 2023-05-25 MED ORDER — TRULICITY 0.75 MG/0.5ML ~~LOC~~ SOAJ: 0.7500 mg | SUBCUTANEOUS | 3 refills | Status: DC

## 2023-05-25 MED ORDER — LOSARTAN POTASSIUM 100 MG PO TABS: 100.0000 mg | ORAL_TABLET | Freq: Every day | ORAL | 3 refills | Status: DC

## 2023-05-25 MED ORDER — FOLIC ACID 1 MG PO TABS: 1.0000 mg | ORAL_TABLET | Freq: Every day | ORAL | 3 refills | Status: DC

## 2023-05-25 NOTE — Progress Notes (Signed)
    [name] [name] presented today for blood pressure check. Patient is prescribed blood pressure medications and I confirmed that patient did take their blood pressure medication prior to today's appointment. Blood pressure was taken in the usual and appropriate manner using an automated BP cuff.     Vitals:   05/25/23 1311 05/25/23 1330  BP: (!) 140/75 (!) 142/73      Results of today's visit will be routed to Dr. Austin Miles for review and further management.

## 2023-05-25 NOTE — Progress Notes (Signed)
Blood pressure mildly elevated at BP visit. Will increase losartan to 100mg  daily in hopes to bring to goal. She is aware of this and in agreement. Recent BMP 1 month ago with stable kidney function and electrolytes. Will have her f/u in 1 month for repeat BP check and A1c.   Merrilyn Puma, MD [facility], PGY-3 05/25/2023, 1:38 PM

## 2023-05-25 NOTE — Progress Notes (Deleted)
Blood pressure mildly elevated at BP visit. Will increase losartan to 100mg daily in hopes to bring to goal. She is aware of this and in agreement. Recent BMP 1 month ago with stable kidney function and electrolytes. Will have her f/u in 1 month for repeat BP check and A1c.   Antwyne Pingree, MD [facility], PGY-3 05/25/2023, 1:38 PM     

## 2023-05-26 DIAGNOSIS — M79662 Pain in left lower leg: Secondary | ICD-10-CM | POA: Diagnosis not present

## 2023-05-26 DIAGNOSIS — G894 Chronic pain syndrome: Secondary | ICD-10-CM | POA: Diagnosis not present

## 2023-05-26 DIAGNOSIS — M5136 Other intervertebral disc degeneration, lumbar region: Secondary | ICD-10-CM | POA: Diagnosis not present

## 2023-05-26 DIAGNOSIS — G8929 Other chronic pain: Secondary | ICD-10-CM | POA: Diagnosis not present

## 2023-05-26 DIAGNOSIS — M25512 Pain in left shoulder: Secondary | ICD-10-CM | POA: Diagnosis not present

## 2023-05-26 DIAGNOSIS — M961 Postlaminectomy syndrome, not elsewhere classified: Secondary | ICD-10-CM | POA: Diagnosis not present

## 2023-05-28 NOTE — Progress Notes (Addendum)
Internal Medicine Clinic Attending  Case discussed with Dr. Austin Miles  At the time of this RN visit.  We reviewed the patient's blood pressure.  I agree with the assessment, and plan of care documented in the resident's note.

## 2023-05-30 ENCOUNTER — Ambulatory Visit
Admission: RE | Admit: 2023-05-30 | Discharge: 2023-05-30 | Disposition: A | Discharge status: Home / Self Care | Payer: Medicaid Other | Source: Ambulatory Visit | Attending: Neurology | Admitting: Neurology

## 2023-05-30 ENCOUNTER — Other Ambulatory Visit: Payer: Medicaid Other

## 2023-05-30 DIAGNOSIS — H539 Unspecified visual disturbance: Secondary | ICD-10-CM

## 2023-05-30 DIAGNOSIS — D8689 Sarcoidosis of other sites: Secondary | ICD-10-CM | POA: Diagnosis not present

## 2023-05-30 DIAGNOSIS — R519 Headache, unspecified: Secondary | ICD-10-CM

## 2023-05-30 DIAGNOSIS — R42 Dizziness and giddiness: Secondary | ICD-10-CM

## 2023-05-30 MED ORDER — GADOPICLENOL 0.5 MMOL/ML IV SOLN
10.0000 mL | Freq: Once | INTRAVENOUS | Status: AC | PRN
  Administered 2023-05-30: 10 mL via INTRAVENOUS

## 2023-06-01 DIAGNOSIS — Z419 Encounter for procedure for purposes other than remedying health state, unspecified: Secondary | ICD-10-CM | POA: Diagnosis not present

## 2023-06-10 ENCOUNTER — Ambulatory Visit
Admission: RE | Admit: 2023-06-10 | Discharge: 2023-06-10 | Disposition: A | Discharge status: Home / Self Care | Payer: Medicaid Other | Source: Ambulatory Visit | Attending: Neurology | Admitting: Neurology

## 2023-06-10 DIAGNOSIS — Z1231 Encounter for screening mammogram for malignant neoplasm of breast: Secondary | ICD-10-CM

## 2023-06-23 DIAGNOSIS — G894 Chronic pain syndrome: Secondary | ICD-10-CM | POA: Diagnosis not present

## 2023-06-23 DIAGNOSIS — M79662 Pain in left lower leg: Secondary | ICD-10-CM | POA: Diagnosis not present

## 2023-06-23 DIAGNOSIS — G8929 Other chronic pain: Secondary | ICD-10-CM | POA: Diagnosis not present

## 2023-06-23 DIAGNOSIS — M961 Postlaminectomy syndrome, not elsewhere classified: Secondary | ICD-10-CM | POA: Diagnosis not present

## 2023-06-23 DIAGNOSIS — M25512 Pain in left shoulder: Secondary | ICD-10-CM | POA: Diagnosis not present

## 2023-06-23 DIAGNOSIS — M5136 Other intervertebral disc degeneration, lumbar region: Secondary | ICD-10-CM | POA: Diagnosis not present

## 2023-07-02 DIAGNOSIS — Z419 Encounter for procedure for purposes other than remedying health state, unspecified: Secondary | ICD-10-CM | POA: Diagnosis not present

## 2023-07-13 ENCOUNTER — Other Ambulatory Visit: Payer: Self-pay | Admitting: Student

## 2023-07-13 DIAGNOSIS — E119 Type 2 diabetes mellitus without complications: Secondary | ICD-10-CM

## 2023-07-13 MED ORDER — EMPAGLIFLOZIN 10 MG PO TABS: 10.0000 mg | ORAL_TABLET | Freq: Every day | ORAL | 3 refills | Status: DC

## 2023-07-13 NOTE — Telephone Encounter (Signed)
JARDIANCE 10 MG TABS tablet   [id number] - [place], [place] - [address]

## 2023-07-14 ENCOUNTER — Telehealth: Payer: Self-pay

## 2023-07-14 NOTE — Telephone Encounter (Addendum)
DECISION : KEY : [id number]    Approved today by [facility] Medicaid 2017 Approved.    This drug has been approved.    Approved quantity: 90 units per 90 day(s). The drug has been approved from 06/30/2023 to 07/13/2024.    Please call the pharmacy to process your prescription claim.   Generic or biosimilar substitution may be required when available and preferred on the formulary.   Authorization Expiration Date: 07/13/2024   Drug Jardiance 10MG  tablets   Form [facility] Medicaid of [employer] Prior Authorization Request Form [phone] NCPDP)     ( COPY SENT TO PHARMACY AND PLACED TO SCAN TO CHART )

## 2023-07-14 NOTE — Telephone Encounter (Signed)
Pa for pt ( ARDIANCE 10MG  TAB ) came through .Marland Kitchen Was submitted with last office notes and labs awaiting approval or denial

## 2023-07-21 DIAGNOSIS — M5136 Other intervertebral disc degeneration, lumbar region: Secondary | ICD-10-CM | POA: Diagnosis not present

## 2023-07-21 DIAGNOSIS — M25512 Pain in left shoulder: Secondary | ICD-10-CM | POA: Diagnosis not present

## 2023-07-21 DIAGNOSIS — M79662 Pain in left lower leg: Secondary | ICD-10-CM | POA: Diagnosis not present

## 2023-07-21 DIAGNOSIS — G894 Chronic pain syndrome: Secondary | ICD-10-CM | POA: Diagnosis not present

## 2023-07-21 DIAGNOSIS — G8929 Other chronic pain: Secondary | ICD-10-CM | POA: Diagnosis not present

## 2023-07-21 DIAGNOSIS — M961 Postlaminectomy syndrome, not elsewhere classified: Secondary | ICD-10-CM | POA: Diagnosis not present

## 2023-08-02 DIAGNOSIS — Z419 Encounter for procedure for purposes other than remedying health state, unspecified: Secondary | ICD-10-CM | POA: Diagnosis not present

## 2023-08-18 DIAGNOSIS — M961 Postlaminectomy syndrome, not elsewhere classified: Secondary | ICD-10-CM | POA: Diagnosis not present

## 2023-08-18 DIAGNOSIS — G8929 Other chronic pain: Secondary | ICD-10-CM | POA: Diagnosis not present

## 2023-08-18 DIAGNOSIS — M25512 Pain in left shoulder: Secondary | ICD-10-CM | POA: Diagnosis not present

## 2023-08-18 DIAGNOSIS — G894 Chronic pain syndrome: Secondary | ICD-10-CM | POA: Diagnosis not present

## 2023-08-18 DIAGNOSIS — M79662 Pain in left lower leg: Secondary | ICD-10-CM | POA: Diagnosis not present

## 2023-08-18 DIAGNOSIS — M5136 Other intervertebral disc degeneration, lumbar region: Secondary | ICD-10-CM | POA: Diagnosis not present

## 2023-08-24 ENCOUNTER — Other Ambulatory Visit: Payer: Self-pay

## 2023-08-24 DIAGNOSIS — E785 Hyperlipidemia, unspecified: Secondary | ICD-10-CM

## 2023-08-24 MED ORDER — ATORVASTATIN CALCIUM 20 MG PO TABS: 20.0000 mg | ORAL_TABLET | Freq: Every day | ORAL | 3 refills | Status: DC

## 2023-09-01 DIAGNOSIS — Z419 Encounter for procedure for purposes other than remedying health state, unspecified: Secondary | ICD-10-CM | POA: Diagnosis not present

## 2023-09-15 DIAGNOSIS — M25512 Pain in left shoulder: Secondary | ICD-10-CM | POA: Diagnosis not present

## 2023-09-15 DIAGNOSIS — M5136 Other intervertebral disc degeneration, lumbar region with discogenic back pain only: Secondary | ICD-10-CM | POA: Diagnosis not present

## 2023-09-15 DIAGNOSIS — M961 Postlaminectomy syndrome, not elsewhere classified: Secondary | ICD-10-CM | POA: Diagnosis not present

## 2023-09-15 DIAGNOSIS — M79662 Pain in left lower leg: Secondary | ICD-10-CM | POA: Diagnosis not present

## 2023-09-15 DIAGNOSIS — G8929 Other chronic pain: Secondary | ICD-10-CM | POA: Diagnosis not present

## 2023-09-15 DIAGNOSIS — G894 Chronic pain syndrome: Secondary | ICD-10-CM | POA: Diagnosis not present

## 2023-10-02 DIAGNOSIS — Z419 Encounter for procedure for purposes other than remedying health state, unspecified: Secondary | ICD-10-CM | POA: Diagnosis not present

## 2023-10-07 ENCOUNTER — Telehealth: Payer: Self-pay

## 2023-10-07 NOTE — Telephone Encounter (Signed)
-----   Message from Glean Salvo sent at 10/06/2023  9:49 PM EST ----- Hi team,   Dr. Lucia Gaskins sent me a message back in the summer wanting me to dismiss this patient for not doing what we say. We have ordered CGRP multiple times and she has never gotten it. Can you call her pharmacy and make sure she has been Tourist information centre manager. She did get an MRI. She hasn't seemed to interested in pursing her management plan. I have an appointment with her coming up. Thanks, Maralyn Sago

## 2023-10-07 NOTE — Telephone Encounter (Signed)
Dispenses (EMGALITY)  No dispenses within 180 days of the adherence period (since 11/07/2022)

## 2023-10-07 NOTE — Telephone Encounter (Signed)
Called and spoke to pharmacy who stated that she had never picked it up there in the past but that when they tried to run it through they stated gets a msg needing pa for the emgality

## 2023-10-08 ENCOUNTER — Other Ambulatory Visit (HOSPITAL_COMMUNITY): Payer: Self-pay

## 2023-10-08 NOTE — Telephone Encounter (Addendum)
[name] [name] would you review patient for dismissal for non compliance please? Thank you

## 2023-10-08 NOTE — Telephone Encounter (Signed)
Please call the patient to see why she has not started taking Emgality.  Back in August 2023 she never got Ajovy.  Dr. Daisy Blossom ordered Emgality in February 2024.  I saw her in June 2024 I reordered [employer]  Unclear why she does not start the multiple CGRP's we order.  I do not see any calls indicating issues picking up.  If she does not wish to be on medications we prescribe for migraines, not sure she has much benefit in seeing our office.

## 2023-10-08 NOTE — Telephone Encounter (Signed)
Called and spoke to patient about medications and she states she hasn't taken emgality in two months because it wasn't really helpful and she already takes shots for diabetes and doesn't want any more injections, plus she is a patient at the pain clinic. I advised on not returning if not taking medications as prescribed and she said she wasn't going to cancel her appointment because she is still having the same issues and they have not been resolved. Patient states she will not keep taking emgality and will be her for her appointment on the 4th of December.

## 2023-10-08 NOTE — Telephone Encounter (Signed)
Will this PT need the loading dose?

## 2023-10-14 ENCOUNTER — Ambulatory Visit (INDEPENDENT_AMBULATORY_CARE_PROVIDER_SITE_OTHER): Payer: Medicaid Other | Admitting: Obstetrics and Gynecology

## 2023-10-14 ENCOUNTER — Encounter: Payer: Self-pay | Admitting: Neurology

## 2023-10-14 ENCOUNTER — Encounter: Payer: Self-pay | Admitting: Obstetrics and Gynecology

## 2023-10-14 VITALS — BP 105/72 | HR 93

## 2023-10-14 DIAGNOSIS — K5904 Chronic idiopathic constipation: Secondary | ICD-10-CM

## 2023-10-14 MED ORDER — POLYETHYLENE GLYCOL 3350 17 GM/SCOOP PO POWD: 17.0000 g | Freq: Every day | ORAL | 11 refills | Status: AC

## 2023-10-14 MED ORDER — PSYLLIUM 58.6 % PO PACK: 1.0000 | PACK | Freq: Every day | ORAL | 11 refills | Status: AC

## 2023-10-14 NOTE — Patient Instructions (Addendum)
For constipation, we reviewed the importance of a better bowel regimen.  We also discussed the importance of avoiding chronic straining, as it can exacerbate her pelvic floor symptoms; we discussed treating constipation and straining prior to surgery, as postoperative straining can lead to damage to the repair and recurrence of symptoms.   Start taking miralax and fiber supplement daily. Drink a lot of water daily to stay hydrated.

## 2023-10-14 NOTE — Progress Notes (Signed)
[facility] Return Visit  SUBJECTIVE  History of Present Illness: [name] [name] is a 57 y.o. female seen in follow-up for.  She feels stool inside of her vagina- small balls are getting stuck. She is taking miralax every few days. She is also taking fiber gummies. She feels the poop has been slowly getting stuck more often.   She is s/p posterior repair on 02/26/23. After her surgery, she did have improvement in her symptoms but they have returned.   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   Past Surgical History: She  has a past surgical history that includes Hysteroscopy w/ endometrial ablation (07/28/2007); Total abdominal hysterectomy (12/2021); Total thyroidectomy (Bilateral, 12/09/2010); Rotator cuff repair (Right, 07/2013); Craniotomy posterior fossa (10/30/2000); Lumbar laminectomy/decompression microdiscectomy (Left, 08/20/2016); Breast excisional biopsy (Bilateral); Cystoscopy/retrograde/ureteroscopy/stone extraction with basket (03/13/2006); Cystoscopy with retrograde pyelogram, ureteroscopy and stent placement (Left, 08/15/2011); Cesarean section; and Rectocele repair (N/A, 02/26/2023).   Medications: She has a current medication list which includes the following prescription(s): polyethylene glycol powder, psyllium, accu-chek guide, albuterol, amlodipine, apple cider vinegar,  aspirin, atorvastatin, b-d ultrafine iii short pen, blood glucose meter kit and supplies, carvedilol, cyclobenzaprine, cyclosporine, trulicity, empagliflozin, fluticasone, folic acid, emgality, levothyroxine, losartan, metamucil fiber, metformin, methotrexate, multivitamin with minerals, ondansetron, oxycodone, and polyethylene glycol powder.   Allergies: Patient is allergic to lisinopril.   Social History: Patient  reports that she has never smoked. She has never used smokeless tobacco. She reports that she does not drink alcohol and does not use drugs.      OBJECTIVE     Physical Exam: Vitals:   10/14/23 1324  BP: 105/72  Pulse: 93   Gen: No apparent distress, A&O x 3.  Detailed Urogynecologic Evaluation:  Normal external genitalia. On speculum, normal vaginal mucosa. On bimanual, no masses present. Ball of stool noted near vaginal apex.   POP-Q  -3                                            Aa   -3                                           Ba  -8                                              C   2                                            Gh  4  Pb  7                                            tvl   -3                                            Ap  -3                                            Bp  -7                                              D   Also examined in standing position.    ASSESSMENT AND PLAN    [name] [name] is a 57 y.o. with:  1. Chronic idiopathic constipation    - No prolapse noted on exam today. - Ball of stool noted at vaginal apex but there was no bulge near introitus. - Recommended starting miralax and psyllium fiber supplement daily for constipation. We discussed that the "balls" of stool is a sign of constipation, even if she is able to go frequently. Showed her picture of ideal stool of [place] 3-4, and that if the stool is harder, it may get stuck or not pass as easily. However there is no  rectocele present to fix on exam today.  - If she does not see improvement with this regimen, then would recommend returning to GI for further discussion of medication for constipation.   Marguerita Beards, MD

## 2023-10-26 ENCOUNTER — Other Ambulatory Visit: Payer: Self-pay

## 2023-10-26 ENCOUNTER — Encounter: Payer: Self-pay | Admitting: Internal Medicine

## 2023-10-26 ENCOUNTER — Ambulatory Visit: Payer: Medicaid Other | Admitting: Internal Medicine

## 2023-10-26 VITALS — BP 138/70 | HR 90 | Temp 98.2°F | Ht 68.0 in | Wt 202.3 lb

## 2023-10-26 DIAGNOSIS — Z7985 Long-term (current) use of injectable non-insulin antidiabetic drugs: Secondary | ICD-10-CM | POA: Diagnosis not present

## 2023-10-26 DIAGNOSIS — Z0000 Encounter for general adult medical examination without abnormal findings: Secondary | ICD-10-CM | POA: Insufficient documentation

## 2023-10-26 DIAGNOSIS — I10 Essential (primary) hypertension: Secondary | ICD-10-CM | POA: Diagnosis not present

## 2023-10-26 DIAGNOSIS — N816 Rectocele: Secondary | ICD-10-CM | POA: Diagnosis not present

## 2023-10-26 DIAGNOSIS — Z23 Encounter for immunization: Secondary | ICD-10-CM

## 2023-10-26 DIAGNOSIS — Z7984 Long term (current) use of oral hypoglycemic drugs: Secondary | ICD-10-CM | POA: Diagnosis not present

## 2023-10-26 DIAGNOSIS — E119 Type 2 diabetes mellitus without complications: Secondary | ICD-10-CM

## 2023-10-26 DIAGNOSIS — D8689 Sarcoidosis of other sites: Secondary | ICD-10-CM

## 2023-10-26 LAB — GLUCOSE, CAPILLARY: Glucose-Capillary: 93 mg/dL (ref 70–99)

## 2023-10-26 LAB — POCT GLYCOSYLATED HEMOGLOBIN (HGB A1C): Hemoglobin A1C: 6.1 % — AB (ref 4.0–5.6)

## 2023-10-26 MED ORDER — EMPAGLIFLOZIN 10 MG PO TABS: 10.0000 mg | ORAL_TABLET | Freq: Every day | ORAL | 3 refills | Status: DC

## 2023-10-26 MED ORDER — TRULICITY 1.5 MG/0.5ML ~~LOC~~ SOAJ: 1.5000 mg | SUBCUTANEOUS | 0 refills | Status: DC

## 2023-10-26 MED ORDER — TRULICITY 3 MG/0.5ML ~~LOC~~ SOAJ: 3.0000 mg | SUBCUTANEOUS | 0 refills | Status: DC

## 2023-10-26 NOTE — Progress Notes (Addendum)
CC: HTN, DM  HPI:  [name] is a 57 y.o. female living with a history stated below and presents today for a follow up of her HTN and DM. Please see problem based assessment and plan for additional details.  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,  no radiation / chemo   HTN (hypertension)    Hyperlipidemia    Hypothyroidism, postsurgical 12/2010   followed by pcp;    s/p total thyroidecotmy ,  hurthle cell caricnoma,  no radiation/ chemo   Nephrolithiasis    02-23-2023  currently has nonobstuctive stones   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   neurologist--- dr [name] [name];   s/p suboccipital craniotomy resection tumor 10-30-2000 by dr [name] [name] ;  intermittant symptoms dizziness worsening nocturnal headaches/ migraines;  treated with methotrate   Nonischemic cardiomyopathy (HCC)    followed by cardiology---   cardiac MRI 01-27-2020 ef 44%;  echo 08-06-2022  ef 60%   Osteoporosis    Peripheral neuropathy    per pt intermittantly feet   Rectocele    Stress incontinence due to pelvic organ prolapse    Type 2 diabetes mellitus (HCC)    followed by pcp   (02-24-2023  per pt does not check blood sugar)   Wears contact lenses     Current Outpatient Medications on File Prior to Visit  Medication Sig Dispense Refill   ACCU-CHEK GUIDE test strip USE AS INSTRUCTED TO CHECK BLOOD SUGAR UP TO 1 TIME A DAY 50 strip 7   albuterol (VENTOLIN HFA) 108 (90 Base) MCG/ACT inhaler Inhale 2 puffs into the lungs every 6 (six) hours as needed for wheezing or shortness of breath. 18 g 1    amLODipine (NORVASC) 5 MG tablet Take 1 tablet by mouth at bedtime.     APPLE CIDER VINEGAR PO Take by mouth daily. chew     aspirin 81 MG chewable tablet Chew 81 mg by mouth daily.     atorvastatin (LIPITOR) 20 MG tablet Take 1 tablet (20 mg total) by mouth at bedtime. 90 tablet 3   B-D ULTRAFINE III SHORT PEN 31G X 8 MM MISC USE TO INJECT INSULIN 1 TIME DAILY DIAG CODE E11.9 INSULIN DEPENDENT 100 each 4   blood glucose meter kit and supplies KIT Dispense based on patient and insurance preference. Use up to four times daily as directed. (FOR ICD-9 250.00, 250.01). 1 each 0   carvedilol (COREG) 6.25 MG tablet Take 1 tablet (6.25 mg total) by mouth 2 (two) times daily with a meal. 180 tablet 3   cyclobenzaprine (FLEXERIL) 5 MG tablet Take 1 tablet (5 mg total) by mouth 3 (three) times daily as needed for muscle spasms. 60 tablet 0   cycloSPORINE (RESTASIS) 0.05 % ophthalmic emulsion Place 1 drop into both eyes 2 (two) times daily.     fluticasone (FLONASE) 50 MCG/ACT nasal spray SPRAY 1 SPRAY INTO BOTH NOSTRILS DAILY. (Patient taking differently: Place 1 spray into both nostrils at bedtime.) 16 mL 5   folic acid (FOLVITE)  1 MG tablet Take 1 tablet (1 mg total) by mouth daily. 90 tablet 3   Galcanezumab-gnlm (EMGALITY) 120 MG/ML SOAJ Inject 120 mg into the skin every 30 (thirty) days. 1.12 mL 11   levothyroxine (SYNTHROID) 137 MCG tablet Take 1 tablet (137 mcg total) by mouth daily before breakfast. TAKE 1 TABLET BY MOUTH EVERY DAY BEFORE BREAKFAST 90 tablet 3   losartan (COZAAR) 100 MG tablet Take 1 tablet (100 mg total) by mouth daily. 90 tablet 3   Metamucil Fiber CHEW Chew by mouth 2 (two) times daily.     metFORMIN (GLUCOPHAGE) 1000 MG tablet Take 1 tablet (1,000 mg total) by mouth 2 (two) times daily with a meal. 180 tablet 3   Multiple Vitamin (MULTIVITAMIN WITH MINERALS) TABS tablet Take 1 tablet by mouth every evening.      ondansetron (ZOFRAN) 4 MG tablet Take 1 tablet (4 mg total) by mouth  every 8 (eight) hours as needed for nausea or vomiting. 30 tablet 0   oxyCODONE (ROXICODONE) 15 MG immediate release tablet Take 1-2 tablets by mouth every 6 (six) hours.     polyethylene glycol powder (GLYCOLAX/MIRALAX) 17 GM/SCOOP powder Take 17 g by mouth daily. Drink 17g (1 scoop) dissolved in water per day. (Patient taking differently: Take 17 g by mouth 2 (two) times daily as needed. Drink 17g (1 scoop) dissolved in water per day.) 255 g 0   polyethylene glycol powder (GLYCOLAX/MIRALAX) 17 GM/SCOOP powder Take 17 g by mouth daily. Drink 17g (1 scoop) dissolved in water per day. 510 g 11   psyllium (METAMUCIL) 58.6 % packet Take 1 packet by mouth daily. 30 each 11   No current facility-administered medications on file prior to visit.    Family History  Problem Relation Age of Onset   Heart disease Mother        questionable CAD and arrythmias    Cancer Mother        Breast cancer   Breast cancer Mother 3   Venous thrombosis Sister    Diabetes Sister    Heart disease Sister    Diabetes Brother    Heart disease Brother    Cancer Maternal Aunt    Breast cancer Maternal Aunt    Venous thrombosis Sister    Diabetes Sister    Heart disease Sister     Social History   Socioeconomic History   Marital status: Married    Spouse name: Stephenie Acres   Number of children: 4   Years of education: 12   Highest education level: Not on file  Occupational History    Employer: UNEMPLOYED  Tobacco Use   Smoking status: Never   Smokeless tobacco: Never  Vaping Use   Vaping status: Never Used  Substance and Sexual Activity   Alcohol use: No    Alcohol/week: 0.0 standard drinks of alcohol   Drug use: Never   Sexual activity: Not on file    Comment: hysterectomy 2013  Other Topics Concern   Not on file  Social History Narrative   Lives in [place] alone    Drinks very little caffeine   Patient is right handed.    Social Determinants of Health   Financial Resource Strain:  Low Risk  (12/09/2022)   Overall Financial Resource Strain (CARDIA)    Difficulty of Paying Living Expenses: Not hard at all  Food Insecurity: No Food Insecurity (12/09/2022)   Hunger Vital Sign    Worried About Running Out of Food in the Last  Year: Never true    Ran Out of Food in the Last Year: Never true  Transportation Needs: No Transportation Needs (12/09/2022)   PRAPARE - Administrator, Civil Service (Medical): No    Lack of Transportation (Non-Medical): No  Physical Activity: Not on file  Stress: Not on file  Social Connections: Moderately Integrated (12/09/2022)   Social Connection and Isolation Panel [NHANES]    Frequency of Communication with Friends and Family: More than three times a week    Frequency of Social Gatherings with Friends and Family: Three times a week    Attends Religious Services: 1 to 4 times per year    Active Member of Clubs or Organizations: No    Attends Banker Meetings: 1 to 4 times per year    Marital Status: Separated  Intimate Partner Violence: Not At Risk (12/09/2022)   Humiliation, Afraid, Rape, and Kick questionnaire    Fear of Current or Ex-Partner: No    Emotionally Abused: No    Physically Abused: No    Sexually Abused: No    Review of Systems: ROS negative except for what is noted on the assessment and plan.  Vitals:   10/26/23 1030  BP: 138/70  Pulse: 90  Temp: 98.2 F (36.8 C)  TempSrc: Oral  SpO2: 100%  Weight: 202 lb 4.8 oz (91.8 kg)  Height: 5\' 8"  (1.727 m)    Physical Exam: Constitutional: well-appearing middle aged female sitting in chair, in no acute distress Cardiovascular: regular rate and rhythm, no m/r/g Pulmonary/Chest: normal work of breathing on room air Abdominal: soft, non-tender, non-distended MSK: normal bulk and tone Skin: warm and dry Psych: normal mood and behavior  Assessment & Plan:    Patient discussed with Dr. Sol Blazing  Essential hypertension Blood pressure is well controlled  today on losartan 100 mg daily.   Plan: - Continue losartan 100 mg daily - BMP today  Controlled diabetes mellitus type 2, (HCC) Diabetes remains well controlled with A1c of 6.1% today. She is on metformin 1000 mg bid, jardiance 10 mg daily, and trulicity 0.75 mg weekly.   Plan: - Continue metformin and jardiance - Increase trulicity to 1.5 mg/week (if well tolerated after 4 weeks can start 3 mg/week dose) - Lipid panel today - Saw Dr. Dione Booze (ophtho) this year  Rectocele Office visit 11/25: The patient has a history of a rectocele and is s/p posterior repair on 02/26/23. The patient was doing well after surgery, but notes that her symptoms have since recurred. She is now feeling stool come down her legs again, and also notes that she feels that there is stool around/near her vagina. She saw her urogynecologist on 11/13 and they did not note any prolapse on exam and advised her to take miralax and fiber, as they thought she was constipated. With the patient telling me that she feels there is stool around her vagina, and I am concerned she may have a fistula, specifically a rectovaginal fistula. She had a colonoscpy one year ago that did not show any polyps, but just showed a tortuous colon.  Plan: - Referral to GI for new colonoscopy (with concern for fistula)  Addendum 12/2:  I have been in touch with this patient's urogynecologist. She did an extensive exam on this patient and the uroogynecologist is NOT concerned about a fistula. Rather, she suspects this patient is very constipated, as she can feel stool through her vaginal wall, but there is no actual stool in the vagina.  The patient was counseled to use miralax and psyllium.   Healthcare maintenance Flu shot given today   Tracia Lacomb, D.O. [facility] Internal Medicine, PGY-3 Phone: [phone] Date 11/03/2023 Time 3:07 PM

## 2023-10-26 NOTE — Assessment & Plan Note (Signed)
Flu shot given today

## 2023-10-26 NOTE — Patient Instructions (Signed)
Thank you, Ms.Breck ABYGALE SHOFNER for allowing [name] to provide your care today. Today we discussed:  Your blood pressure is under good control - keep taking losartan once a day Your diabetes is also under good control! Keep taking metformin twice a day, jardiance once a day and trulicity once a week. I sent in a higher dose of Trulicity to help you lose weight I have sent a referral to a new neurologist Also sent a referral to GI for a colonoscopy   I have ordered the following labs for you:   Lab Orders         Microalbumin / Creatinine Urine Ratio         Glucose, capillary         BMP8+Anion Gap         Lipid Profile         POC Hbg A1C        Referrals ordered today:    Referral Orders         Ambulatory referral to Neurology         Ambulatory referral to Gastroenterology      I have ordered the following medication/changed the following medications:   Stop the following medications: Medications Discontinued During This Encounter  Medication Reason   Dulaglutide (TRULICITY) 0.75 MG/0.5ML SOPN    empagliflozin (JARDIANCE) 10 MG TABS tablet Reorder     Start the following medications: Meds ordered this encounter  Medications   empagliflozin (JARDIANCE) 10 MG TABS tablet    Sig: Take 1 tablet (10 mg total) by mouth daily.    Dispense:  90 tablet    Refill:  3   Dulaglutide (TRULICITY) 1.5 MG/0.5ML SOAJ    Sig: Inject 1.5 mg into the skin once a week.    Dispense:  3 mL    Refill:  0     Follow up: 3-4 months     Should you have any questions or concerns please call the internal medicine clinic at [phone].     Elza Rafter, D.O. [facility]

## 2023-10-26 NOTE — Assessment & Plan Note (Signed)
Blood pressure is well controlled today on losartan 100 mg daily.   Plan: - Continue losartan 100 mg daily - BMP today

## 2023-10-26 NOTE — Assessment & Plan Note (Addendum)
Office visit 11/25: The patient has a history of a rectocele and is s/p posterior repair on 02/26/23. The patient was doing well after surgery, but notes that her symptoms have since recurred. She is now feeling stool come down her legs again, and also notes that she feels that there is stool around/near her vagina. She saw her urogynecologist on 11/13 and they did not note any prolapse on exam and advised her to take miralax and fiber, as they thought she was constipated. With the patient telling me that she feels there is stool around her vagina, and I am concerned she may have a fistula, specifically a rectovaginal fistula. She had a colonoscpy one year ago that did not show any polyps, but just showed a tortuous colon.  Plan: - Referral to GI for new colonoscopy (with concern for fistula)  Addendum 12/2:  I have been in touch with this patient's urogynecologist. She did an extensive exam on this patient and the uroogynecologist is NOT concerned about a fistula. Rather, she suspects this patient is very constipated, as she can feel stool through her vaginal wall, but there is no actual stool in the vagina. The patient was counseled to use miralax and psyllium.

## 2023-10-26 NOTE — Assessment & Plan Note (Addendum)
Diabetes remains well controlled with A1c of 6.1% today. She is on metformin 1000 mg bid, jardiance 10 mg daily, and trulicity 0.75 mg weekly.   Plan: - Continue metformin and jardiance - Increase trulicity to 1.5 mg/week (if well tolerated after 4 weeks can start 3 mg/week dose) - Lipid panel today - Saw Dr. Dione Booze (ophtho) this year

## 2023-10-27 ENCOUNTER — Other Ambulatory Visit: Payer: Self-pay | Admitting: Internal Medicine

## 2023-10-27 LAB — LIPID PANEL
Chol/HDL Ratio: 2.8 {ratio} (ref 0.0–4.4)
Cholesterol, Total: 138 mg/dL (ref 100–199)
HDL: 49 mg/dL (ref >39)
LDL Chol Calc (NIH): 75 mg/dL (ref 0–99)
Triglycerides: 72 mg/dL (ref 0–149)
VLDL Cholesterol Cal: 14 mg/dL (ref 5–40)

## 2023-10-27 LAB — BMP8+ANION GAP
Anion Gap: 17 mmol/L (ref 10.0–18.0)
BUN/Creatinine Ratio: 22 (ref 9–23)
BUN: 16 mg/dL (ref 6–24)
CO2: 22 mmol/L (ref 20–29)
Calcium: 9.2 mg/dL (ref 8.7–10.2)
Chloride: 103 mmol/L (ref 96–106)
Creatinine, Ser: 0.73 mg/dL (ref 0.57–1.00)
Glucose: 87 mg/dL (ref 70–99)
Potassium: 3.9 mmol/L (ref 3.5–5.2)
Sodium: 142 mmol/L (ref 134–144)
eGFR: 97 mL/min/{1.73_m2} (ref >59)

## 2023-10-27 LAB — MICROALBUMIN / CREATININE URINE RATIO
Creatinine, Urine: 122.7 mg/dL
Microalb/Creat Ratio: 7 mg/g{creat} (ref 0–29)
Microalbumin, Urine: 8 ug/mL

## 2023-10-27 MED ORDER — METHOTREXATE SODIUM 2.5 MG PO TABS: 2.5000 mg | ORAL_TABLET | ORAL | 0 refills | Status: DC

## 2023-10-27 NOTE — Progress Notes (Signed)
Sent in refill for patient's methotrexate that she takes once weekly for neurosarcoidosis. Patient was dismissed from her neurologist on 11/13 due to "non-compliance of the medical treatment set forth by the physician" and I sent in a referral to a new neurologist yesterday. Patient states that she declined botox therapy for her migraine headaches, but otherwise has complied with the medications prescribed by her neurologist. Will send in refills of methotrexate until she is able to establish with a new neurologist.

## 2023-10-27 NOTE — Progress Notes (Signed)
Called about normal results.

## 2023-11-01 DIAGNOSIS — Z419 Encounter for procedure for purposes other than remedying health state, unspecified: Secondary | ICD-10-CM | POA: Diagnosis not present

## 2023-11-02 NOTE — Progress Notes (Signed)
Internal Medicine Clinic Attending  Case discussed with the resident at the time of the visit.  We reviewed the resident's history and exam and pertinent patient test results.  I agree with the assessment, diagnosis, and plan of care documented in the resident's note.  

## 2023-11-04 ENCOUNTER — Ambulatory Visit: Payer: Medicaid Other | Admitting: Neurology

## 2023-11-05 ENCOUNTER — Other Ambulatory Visit: Payer: Self-pay | Admitting: Internal Medicine

## 2023-11-05 DIAGNOSIS — D8689 Sarcoidosis of other sites: Secondary | ICD-10-CM

## 2023-11-05 NOTE — Progress Notes (Signed)
Referral to neurology placed as patient has a hx of neurosarcoidosis.

## 2023-11-10 DIAGNOSIS — G894 Chronic pain syndrome: Secondary | ICD-10-CM | POA: Diagnosis not present

## 2023-11-10 DIAGNOSIS — M25512 Pain in left shoulder: Secondary | ICD-10-CM | POA: Diagnosis not present

## 2023-11-10 DIAGNOSIS — M961 Postlaminectomy syndrome, not elsewhere classified: Secondary | ICD-10-CM | POA: Diagnosis not present

## 2023-11-10 DIAGNOSIS — Z79891 Long term (current) use of opiate analgesic: Secondary | ICD-10-CM | POA: Diagnosis not present

## 2023-11-10 DIAGNOSIS — M79662 Pain in left lower leg: Secondary | ICD-10-CM | POA: Diagnosis not present

## 2023-11-17 ENCOUNTER — Ambulatory Visit (INDEPENDENT_AMBULATORY_CARE_PROVIDER_SITE_OTHER): Payer: Medicaid Other | Admitting: Obstetrics and Gynecology

## 2023-11-17 ENCOUNTER — Encounter: Payer: Self-pay | Admitting: Obstetrics and Gynecology

## 2023-11-17 VITALS — BP 117/72 | HR 105

## 2023-11-17 DIAGNOSIS — K5902 Outlet dysfunction constipation: Secondary | ICD-10-CM | POA: Diagnosis not present

## 2023-11-17 NOTE — Progress Notes (Signed)
[facility] Return Visit  SUBJECTIVE  History of Present Illness: [name] [name] is a 57 y.o. female seen in follow-up for constipation and defecatory dysfunction.  Has been using miralax daily. She does feel her stool is softer but still has hard balls of stool that she can feel through her vaginal wall. She has used a finger in the vagina and in the rectum to try to push out the stool. She has seen GI last year and wants a colonoscopy repeated. Sometimes feels a lot of cramping. Has an appt with Dr Elnoria Howard (GI) in a few weeks for the colonoscopy.   She is s/p posterior repair on 02/26/23.   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   Past Surgical History: She  has a past surgical history that includes Hysteroscopy w/ endometrial ablation (07/28/2007); Total abdominal hysterectomy (12/2021); Total thyroidectomy (Bilateral, 12/09/2010); Rotator cuff repair (Right, 07/2013); Craniotomy posterior fossa (10/30/2000); Lumbar laminectomy/decompression microdiscectomy (Left, 08/20/2016); Breast excisional biopsy (Bilateral); Cystoscopy/retrograde/ureteroscopy/stone extraction with basket (03/13/2006); Cystoscopy with retrograde pyelogram, ureteroscopy and stent placement (Left, 08/15/2011); Cesarean section; and Rectocele repair (N/A, 02/26/2023).   Medications: She has a current medication list which  includes the following prescription(s): accu-chek guide, albuterol, amlodipine, apple cider vinegar, aspirin, atorvastatin, b-d ultrafine iii short pen, blood glucose meter kit and supplies, carvedilol, cyclobenzaprine, cyclosporine, trulicity, [START ON 11/30/2023] trulicity, empagliflozin, fluticasone, folic acid, emgality, levothyroxine, losartan, metamucil fiber, metformin, methotrexate, multivitamin with minerals, ondansetron, oxycodone, polyethylene glycol powder, polyethylene glycol powder, and psyllium.   Allergies: Patient is allergic to lisinopril.   Social History: Patient  reports that she has never smoked. She has never used smokeless tobacco. She reports that she does not drink alcohol and does not use drugs.      OBJECTIVE     Physical Exam: Vitals:   11/17/23 1402  BP: 117/72  Pulse: (!) 105    Gen: No apparent distress, A&O x 3.  Detailed Urogynecologic Evaluation:  Normal external genitalia. On speculum, normal vaginal mucosa. Rectovaginal exam normal with no evidence of fistula.    ASSESSMENT AND PLAN    [name] [name] is a 57 y.o. with:  1. Rectosphincteric dyssynergia     - No prolapse noted on exam today. - Detailed discussion today regarding how balls of stool can be felt through the vaginal wall because they are pushing from the rectum. Miralax has made stool a little softer but has not really improved symptoms.  - recommend further GI follow up to improve contstipation. She has colonoscopy scheduled in a few weeks.  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 We discussed revisiting pelvic PT now that she does not have prolapse, as she may be able to make more progress. Referral placed.   Return as needed  Marguerita Beards, MD   Time spent: I spent 25 minutes dedicated to the care of this patient on the date of this encounter to include pre-visit review of records,  face-to-face time with the patient  and post visit documentation.

## 2023-11-23 ENCOUNTER — Other Ambulatory Visit: Payer: Self-pay

## 2023-11-23 NOTE — Telephone Encounter (Signed)
Open error 

## 2023-11-30 ENCOUNTER — Telehealth: Payer: Self-pay

## 2023-11-30 NOTE — Telephone Encounter (Signed)
Patient called she stated she went to [facility] in [place] and they do not accept her insurance, patient is requesting a call back.

## 2023-12-02 DIAGNOSIS — Z419 Encounter for procedure for purposes other than remedying health state, unspecified: Secondary | ICD-10-CM | POA: Diagnosis not present

## 2023-12-08 DIAGNOSIS — M25512 Pain in left shoulder: Secondary | ICD-10-CM | POA: Diagnosis not present

## 2023-12-08 DIAGNOSIS — G8929 Other chronic pain: Secondary | ICD-10-CM | POA: Diagnosis not present

## 2023-12-08 DIAGNOSIS — Z79891 Long term (current) use of opiate analgesic: Secondary | ICD-10-CM | POA: Diagnosis not present

## 2023-12-08 DIAGNOSIS — M79662 Pain in left lower leg: Secondary | ICD-10-CM | POA: Diagnosis not present

## 2023-12-08 DIAGNOSIS — G894 Chronic pain syndrome: Secondary | ICD-10-CM | POA: Diagnosis not present

## 2023-12-08 DIAGNOSIS — M961 Postlaminectomy syndrome, not elsewhere classified: Secondary | ICD-10-CM | POA: Diagnosis not present

## 2023-12-09 ENCOUNTER — Telehealth: Payer: Self-pay | Admitting: *Deleted

## 2023-12-09 ENCOUNTER — Telehealth: Payer: Self-pay

## 2023-12-09 DIAGNOSIS — E119 Type 2 diabetes mellitus without complications: Secondary | ICD-10-CM

## 2023-12-09 MED ORDER — TRULICITY 3 MG/0.5ML ~~LOC~~ SOAJ: 3.0000 mg | SUBCUTANEOUS | 3 refills | Status: DC

## 2023-12-09 NOTE — Telephone Encounter (Signed)
 Pt is requesting a call back... she stated that at her last OV with Dr Jimmy she was told that she would up her dose of the ( Trulicity  ) she is on the 3mg   at the moment when she went to the pharmacy they wont fill her meds and  stated that the Dr is to call them

## 2023-12-09 NOTE — Telephone Encounter (Signed)
 Pt was called / informed of Dr Chelsea Aus response and rx for 3mg  has been sent to the pharmacy.

## 2023-12-09 NOTE — Telephone Encounter (Signed)
 Sent refill for Trulicity 3 mg weekly to [facility] pharmacy on [address] (6 mL, 3 refills). Her diabetes is at goal with current regimen (A1c 6.1% in 10/2023). Thanks

## 2023-12-09 NOTE — Telephone Encounter (Signed)
 Pt stated the doctor suppose to increase the Trulicity dosage; current she's at 3 mg. She takes this med every Wednesday. Send new rx if appropriate to [facility] pharmacy. Thanks

## 2024-01-02 DIAGNOSIS — Z419 Encounter for procedure for purposes other than remedying health state, unspecified: Secondary | ICD-10-CM | POA: Diagnosis not present

## 2024-01-05 DIAGNOSIS — M25512 Pain in left shoulder: Secondary | ICD-10-CM | POA: Diagnosis not present

## 2024-01-05 DIAGNOSIS — M79662 Pain in left lower leg: Secondary | ICD-10-CM | POA: Diagnosis not present

## 2024-01-05 DIAGNOSIS — Z79891 Long term (current) use of opiate analgesic: Secondary | ICD-10-CM | POA: Diagnosis not present

## 2024-01-05 DIAGNOSIS — G894 Chronic pain syndrome: Secondary | ICD-10-CM | POA: Diagnosis not present

## 2024-01-05 DIAGNOSIS — G8929 Other chronic pain: Secondary | ICD-10-CM | POA: Diagnosis not present

## 2024-01-22 ENCOUNTER — Other Ambulatory Visit: Payer: Self-pay | Admitting: Internal Medicine

## 2024-01-26 ENCOUNTER — Ambulatory Visit: Payer: Medicaid Other | Admitting: Internal Medicine

## 2024-01-26 ENCOUNTER — Telehealth: Payer: Self-pay

## 2024-01-26 VITALS — BP 137/71 | HR 91 | Temp 98.1°F | Ht 68.0 in | Wt 201.7 lb

## 2024-01-26 DIAGNOSIS — E119 Type 2 diabetes mellitus without complications: Secondary | ICD-10-CM

## 2024-01-26 DIAGNOSIS — Z7984 Long term (current) use of oral hypoglycemic drugs: Secondary | ICD-10-CM | POA: Diagnosis not present

## 2024-01-26 DIAGNOSIS — E890 Postprocedural hypothyroidism: Secondary | ICD-10-CM | POA: Diagnosis not present

## 2024-01-26 DIAGNOSIS — Z7985 Long-term (current) use of injectable non-insulin antidiabetic drugs: Secondary | ICD-10-CM

## 2024-01-26 DIAGNOSIS — M8000XS Age-related osteoporosis with current pathological fracture, unspecified site, sequela: Secondary | ICD-10-CM

## 2024-01-26 DIAGNOSIS — I10 Essential (primary) hypertension: Secondary | ICD-10-CM

## 2024-01-26 LAB — GLUCOSE, CAPILLARY: Glucose-Capillary: 112 mg/dL — ABNORMAL HIGH (ref 70–99)

## 2024-01-26 LAB — POCT GLYCOSYLATED HEMOGLOBIN (HGB A1C): Hemoglobin A1C: 6.3 % — AB (ref 4.0–5.6)

## 2024-01-26 MED ORDER — MOUNJARO 2.5 MG/0.5ML ~~LOC~~ SOAJ: 2.5000 mg | SUBCUTANEOUS | 1 refills | Status: DC

## 2024-01-26 MED ORDER — CARVEDILOL 6.25 MG PO TABS: 6.2500 mg | ORAL_TABLET | Freq: Two times a day (BID) | ORAL | 3 refills | Status: DC

## 2024-01-26 NOTE — Telephone Encounter (Signed)
 Prior Authorization for patient [name] [name] 2.5MG /0.5ML auto-injectors) came through on cover my meds was submitted with last office notes and labs awaiting approval or denial.  KEY:[id number]

## 2024-01-26 NOTE — Telephone Encounter (Signed)
 [name] [name] (Key: [place] April) PA Case ID #: [id number] Rx #: [id number] Need Help? Call us at [phone] Outcome Approved today by [facility] Medicaid 2017 Approved. This drug has been approved. Approved quantity: 2 milliliters per 28 day(s). You may fill up to a 34 day supply at a retail pharmacy. You may fill up to a 90 day supply for maintenance drugs, please refer to the formulary for details. Please call the pharmacy to process your prescription claim. Effective Date: 01/26/2024 Authorization Expiration Date: 01/25/2025 Drug Mounjaro 2.5MG /0.5ML auto-injectors ePA cloud logo Form [facility] Medicaid of [employer] Electronic Prior Authorization Request Form [phone] NCPDP) Original Claim Info 75

## 2024-01-26 NOTE — Progress Notes (Signed)
 CC: diabetes  HPI:  [name] [name] is a 58 y.o. female living with a history stated below and presents today for a 3 month follow up of her diabetes. Please see problem based assessment and plan for additional details.  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   followed by pcp;    s/p total thyroidecotmy ,  hurthle cell caricnoma,  no radiation/ chemo   Nephrolithiasis    02-23-2023  currently has nonobstuctive stones   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   neurologist--- dr Lucia Gaskins;   s/p suboccipital craniotomy resection tumor 10-30-2000 by dr Newell Coral ;  intermittant symptoms dizziness worsening nocturnal headaches/ migraines;  treated with methotrate   Nonischemic cardiomyopathy (HCC)    followed by cardiology---   cardiac MRI 01-27-2020 ef 44%;  echo 08-06-2022  ef 60%   Osteoporosis    Peripheral neuropathy    per pt intermittantly feet   Rectocele    Stress incontinence due to pelvic organ prolapse    Type 2 diabetes mellitus (HCC)    followed by pcp   (02-24-2023  per pt does not check blood sugar)   Wears contact lenses     Current Outpatient Medications on File Prior to Visit  Medication Sig Dispense Refill   ACCU-CHEK GUIDE test strip USE AS INSTRUCTED TO CHECK BLOOD SUGAR UP TO 1 TIME A DAY 50 strip 7   albuterol (VENTOLIN HFA) 108 (90 Base) MCG/ACT inhaler Inhale 2 puffs into the lungs every 6 (six) hours as needed for wheezing or shortness of breath. 18 g  1   amLODipine (NORVASC) 5 MG tablet Take 1 tablet by mouth at bedtime.     APPLE CIDER VINEGAR PO Take by mouth daily. chew     aspirin 81 MG chewable tablet Chew 81 mg by mouth daily.     atorvastatin (LIPITOR) 20 MG tablet Take 1 tablet (20 mg total) by mouth at bedtime. 90 tablet 3   B-D ULTRAFINE III SHORT PEN 31G X 8 MM MISC USE TO INJECT INSULIN 1 TIME DAILY DIAG CODE E11.9 INSULIN DEPENDENT 100 each 4   blood glucose meter kit and supplies KIT Dispense based on patient and insurance preference. Use up to four times daily as directed. (FOR ICD-9 250.00, 250.01). 1 each 0   cyclobenzaprine (FLEXERIL) 5 MG tablet Take 1 tablet (5 mg total) by mouth 3 (three) times daily as needed for muscle spasms. 60 tablet 0   cycloSPORINE (RESTASIS) 0.05 % ophthalmic emulsion Place 1 drop into both eyes 2 (two) times daily.     empagliflozin (JARDIANCE) 10 MG TABS tablet Take 1 tablet (10 mg total) by mouth daily. 90 tablet 3   fluticasone (FLONASE) 50 MCG/ACT nasal spray SPRAY 1 SPRAY INTO BOTH NOSTRILS DAILY. (Patient taking differently: Place 1 spray into both nostrils at bedtime.) 16 mL 5   folic acid (FOLVITE) 1 MG tablet Take 1 tablet (  1 mg total) by mouth daily. 90 tablet 3   Galcanezumab-gnlm (EMGALITY) 120 MG/ML SOAJ Inject 120 mg into the skin every 30 (thirty) days. 1.12 mL 11   levothyroxine (SYNTHROID) 137 MCG tablet Take 1 tablet (137 mcg total) by mouth daily before breakfast. TAKE 1 TABLET BY MOUTH EVERY DAY BEFORE BREAKFAST 90 tablet 3   losartan (COZAAR) 100 MG tablet Take 1 tablet (100 mg total) by mouth daily. 90 tablet 3   Metamucil Fiber CHEW Chew by mouth 2 (two) times daily.     metFORMIN (GLUCOPHAGE) 1000 MG tablet Take 1 tablet (1,000 mg total) by mouth 2 (two) times daily with a meal. 180 tablet 3   methotrexate (RHEUMATREX) 2.5 MG tablet TAKE 1 TABLET BY MOUTH ONCE A WEEK. CAUTION:CHEMOTHERAPY. PROTECT FROM LIGHT. STRENGTH: 2.5 MG 12 tablet 0   Multiple Vitamin (MULTIVITAMIN WITH  MINERALS) TABS tablet Take 1 tablet by mouth every evening.      oxyCODONE (ROXICODONE) 15 MG immediate release tablet Take 1-2 tablets by mouth every 6 (six) hours.     polyethylene glycol powder (GLYCOLAX/MIRALAX) 17 GM/SCOOP powder Take 17 g by mouth daily. Drink 17g (1 scoop) dissolved in water per day. 510 g 11   psyllium (METAMUCIL) 58.6 % packet Take 1 packet by mouth daily. 30 each 11   No current facility-administered medications on file prior to visit.    Family History  Problem Relation Age of Onset   Heart disease Mother        questionable CAD and arrythmias    Cancer Mother        Breast cancer   Breast cancer Mother 65   Venous thrombosis Sister    Diabetes Sister    Heart disease Sister    Diabetes Brother    Heart disease Brother    Cancer Maternal Aunt    Breast cancer Maternal Aunt    Venous thrombosis Sister    Diabetes Sister    Heart disease Sister     Social History   Socioeconomic History   Marital status: Married    Spouse name: Stephenie Acres   Number of children: 4   Years of education: 12   Highest education level: Not on file  Occupational History    Employer: UNEMPLOYED  Tobacco Use   Smoking status: Never   Smokeless tobacco: Never  Vaping Use   Vaping status: Never Used  Substance and Sexual Activity   Alcohol use: No    Alcohol/week: 0.0 standard drinks of alcohol   Drug use: Never   Sexual activity: Not on file    Comment: hysterectomy 2013  Other Topics Concern   Not on file  Social History Narrative   Lives in [place] alone    Drinks very little caffeine   Patient is right handed.    Social Drivers of Corporate investment banker Strain: Low Risk  (12/09/2022)   Overall Financial Resource Strain (CARDIA)    Difficulty of Paying Living Expenses: Not hard at all  Food Insecurity: No Food Insecurity (12/09/2022)   Hunger Vital Sign    Worried About Running Out of Food in the Last Year: Never true    Ran Out of Food in the  Last Year: Never true  Transportation Needs: No Transportation Needs (12/09/2022)   PRAPARE - Administrator, Civil Service (Medical): No    Lack of Transportation (Non-Medical): No  Physical Activity: Not on file  Stress: Not on file  Social Connections: Moderately  Integrated (12/09/2022)   Social Connection and Isolation Panel [NHANES]    Frequency of Communication with Friends and Family: More than three times a week    Frequency of Social Gatherings with Friends and Family: Three times a week    Attends Religious Services: 1 to 4 times per year    Active Member of Clubs or Organizations: No    Attends Banker Meetings: 1 to 4 times per year    Marital Status: Separated  Intimate Partner Violence: Not At Risk (12/09/2022)   Humiliation, Afraid, Rape, and Kick questionnaire    Fear of Current or Ex-Partner: No    Emotionally Abused: No    Physically Abused: No    Sexually Abused: No    Review of Systems: ROS negative except for what is noted on the assessment and plan.  Vitals:   01/26/24 1422  BP: 137/71  Pulse: 91  Temp: 98.1 F (36.7 C)  TempSrc: Oral  SpO2: 100%  Weight: 201 lb 11.2 oz (91.5 kg)  Height: 5\' 8"  (1.727 m)    Physical Exam: Constitutional: appears well, NAD Cardiovascular: regular rate and rhythm, no m/r/g Pulmonary/Chest: normal work of breathing on room air, lungs clear to auscultation bilaterally MSK: normal bulk and tone Skin: warm and dry Psych: normal mood and behavior  Assessment & Plan:   Patient discussed with Dr. Mayford Knife  Hypothyroidism Hx of thyroid cancer s/p thyroidectomy in 2012 and remains on synthroid 137 mcg daily. TSH has not been checked in 1 year, will recheck today.    Controlled diabetes mellitus type 2, (HCC) Diabetes remains well-controlled with an A1c of 6.3%.  She takes metformin 1 g twice daily, Jardiance 10 mg daily, Trulicity 3 mg/week.  She has not had any weight loss with Trulicity, despite  uptitrating its dose.  Plan: - Continue metformin 1 g twice daily - Continue Jardiance 10 mg daily - Start Mounjaro 2.5 mg/week (continue Trulicity until she gets [place])  Osteoporosis Patient has a history of osteoporosis (previously treated) and last DEXA scan upon my review was 2014.  Plan: - Vit D and DEXA scan   Carinna Newhart, D.O. [facility], PGY-3 Phone: [phone] Date 01/26/2024 Time 3:21 PM

## 2024-01-26 NOTE — Assessment & Plan Note (Signed)
 Diabetes remains well-controlled with an A1c of 6.3%.  She takes metformin 1 g twice daily, Jardiance 10 mg daily, Trulicity 3 mg/week.  She has not had any weight loss with Trulicity, despite uptitrating its dose.  Plan: - Continue metformin 1 g twice daily - Continue Jardiance 10 mg daily - Start Mounjaro 2.5 mg/week (continue Trulicity until she gets [employer])

## 2024-01-26 NOTE — Patient Instructions (Signed)
 Thank you, [name] for allowing [name] to provide your care today. Today we discussed:  Diabetes Keep taking Trulicity UNTIL you get Mounjaro/Tirzepatide - once you get this, you will stop Trulicity as this will replace it Keep taking metformin and jardiance  We are rechecking your thyroid and vitamin D levels today  Keep taking losartan once a day, carvedilol twice a day, and amlodipine once a day  for your blood pressure/heart  I have ordered the following labs for you:   Lab Orders         Glucose, capillary         TSH         Vitamin D (25 hydroxy)         POC Hbg A1C       Referrals ordered today:   Referral Orders  No referral(s) requested today     I have ordered the following medication/changed the following medications:   Stop the following medications: Medications Discontinued During This Encounter  Medication Reason   polyethylene glycol powder (GLYCOLAX/MIRALAX) 17 GM/SCOOP powder    ondansetron (ZOFRAN) 4 MG tablet    Dulaglutide (TRULICITY) 3 MG/0.5ML SOAJ    carvedilol (COREG) 6.25 MG tablet Reorder     Start the following medications: Meds ordered this encounter  Medications   tirzepatide (MOUNJARO) 2.5 MG/0.5ML Pen    Sig: Inject 2.5 mg into the skin once a week.    Dispense:  3 mL    Refill:  1   carvedilol (COREG) 6.25 MG tablet    Sig: Take 1 tablet (6.25 mg total) by mouth 2 (two) times daily with a meal.    Dispense:  180 tablet    Refill:  3     Follow up: 3 months    Should you have any questions or concerns please call the internal medicine clinic at [phone].     Elza Rafter, D.O. [facility]

## 2024-01-26 NOTE — Assessment & Plan Note (Signed)
 Patient has a history of osteoporosis (previously treated) and last DEXA scan upon my review was 2014.  Plan: - Vit D and DEXA scan

## 2024-01-26 NOTE — Assessment & Plan Note (Signed)
 Hx of thyroid cancer s/p thyroidectomy in 2012 and remains on synthroid 137 mcg daily. TSH has not been checked in 1 year, will recheck today.

## 2024-01-26 NOTE — Assessment & Plan Note (Signed)
 The patient has a history of hypertension and HFmrEF.  Continue losartan 100 mg daily, Jardiance 10 mg daily, Coreg 6.25 mg twice daily, and amlodipine 10 mg daily.

## 2024-01-27 LAB — TSH: TSH: 0.411 u[IU]/mL — ABNORMAL LOW (ref 0.450–4.500)

## 2024-01-27 LAB — VITAMIN D 25 HYDROXY (VIT D DEFICIENCY, FRACTURES): Vit D, 25-Hydroxy: 45 ng/mL (ref 30.0–100.0)

## 2024-01-27 NOTE — Addendum Note (Signed)
 Addended by: Elza Rafter on: 01/27/2024 06:51 AM   Modules accepted: Orders

## 2024-01-29 ENCOUNTER — Other Ambulatory Visit: Payer: Self-pay | Admitting: Internal Medicine

## 2024-01-29 DIAGNOSIS — E119 Type 2 diabetes mellitus without complications: Secondary | ICD-10-CM

## 2024-01-29 LAB — T4, FREE: Free T4: 1.85 ng/dL — ABNORMAL HIGH (ref 0.82–1.77)

## 2024-01-29 LAB — SPECIMEN STATUS REPORT

## 2024-01-29 MED ORDER — LEVOTHYROXINE SODIUM 125 MCG PO TABS: 125.0000 ug | ORAL_TABLET | Freq: Every day | ORAL | 11 refills | Status: DC

## 2024-01-29 MED ORDER — EMPAGLIFLOZIN 10 MG PO TABS
10.0000 mg | ORAL_TABLET | Freq: Every day | ORAL | 3 refills | Status: DC
Start: 2024-01-29 — End: 2024-08-24

## 2024-01-29 NOTE — Progress Notes (Signed)
 TSH low and free T4 elevated to 1.85, consistent with hyperthyroid (over-repletion with synthroid). Reduced synthroid from 137 mcg to 125 mcg and have asked the patient to make an appt in ~4-6 weeks to recheck thyroid labs.

## 2024-01-29 NOTE — Progress Notes (Signed)
 Refilled jardiance and sent in lower dose of synthroid - see result note.

## 2024-01-30 DIAGNOSIS — Z419 Encounter for procedure for purposes other than remedying health state, unspecified: Secondary | ICD-10-CM | POA: Diagnosis not present

## 2024-02-01 NOTE — Progress Notes (Signed)
 Internal Medicine Clinic Attending  Case discussed with the resident at the time of the visit.  We reviewed the resident's history and exam and pertinent patient test results.  I agree with the assessment, diagnosis, and plan of care documented in the resident's note.

## 2024-02-02 DIAGNOSIS — G894 Chronic pain syndrome: Secondary | ICD-10-CM | POA: Diagnosis not present

## 2024-02-02 DIAGNOSIS — M961 Postlaminectomy syndrome, not elsewhere classified: Secondary | ICD-10-CM | POA: Diagnosis not present

## 2024-02-02 DIAGNOSIS — M25512 Pain in left shoulder: Secondary | ICD-10-CM | POA: Diagnosis not present

## 2024-02-02 DIAGNOSIS — G8929 Other chronic pain: Secondary | ICD-10-CM | POA: Diagnosis not present

## 2024-02-02 DIAGNOSIS — Z79891 Long term (current) use of opiate analgesic: Secondary | ICD-10-CM | POA: Diagnosis not present

## 2024-02-22 DIAGNOSIS — I5020 Unspecified systolic (congestive) heart failure: Secondary | ICD-10-CM | POA: Diagnosis not present

## 2024-02-22 DIAGNOSIS — Z79899 Other long term (current) drug therapy: Secondary | ICD-10-CM | POA: Diagnosis not present

## 2024-02-22 DIAGNOSIS — G43709 Chronic migraine without aura, not intractable, without status migrainosus: Secondary | ICD-10-CM | POA: Diagnosis not present

## 2024-02-22 DIAGNOSIS — D84821 Immunodeficiency due to drugs: Secondary | ICD-10-CM | POA: Diagnosis not present

## 2024-02-22 DIAGNOSIS — D8689 Sarcoidosis of other sites: Secondary | ICD-10-CM | POA: Diagnosis not present

## 2024-02-22 DIAGNOSIS — E118 Type 2 diabetes mellitus with unspecified complications: Secondary | ICD-10-CM | POA: Diagnosis not present

## 2024-02-26 ENCOUNTER — Telehealth: Payer: Self-pay

## 2024-02-26 ENCOUNTER — Ambulatory Visit: Payer: Medicaid Other | Admitting: Internal Medicine

## 2024-02-26 VITALS — BP 116/74 | HR 80 | Temp 97.9°F | Ht 68.0 in | Wt 195.4 lb

## 2024-02-26 DIAGNOSIS — Z7985 Long-term (current) use of injectable non-insulin antidiabetic drugs: Secondary | ICD-10-CM

## 2024-02-26 DIAGNOSIS — E119 Type 2 diabetes mellitus without complications: Secondary | ICD-10-CM | POA: Diagnosis not present

## 2024-02-26 DIAGNOSIS — E890 Postprocedural hypothyroidism: Secondary | ICD-10-CM

## 2024-02-26 MED ORDER — MOUNJARO 5 MG/0.5ML ~~LOC~~ SOAJ: 5.0000 mg | SUBCUTANEOUS | 3 refills | Status: DC

## 2024-02-26 NOTE — Telephone Encounter (Signed)
 Prior Authorization for patient [name] [name] 5MG /0.5ML auto-injectors) came through on cover my meds was submitted with last office notes and labs awaiting approval or denial.  ZOX:[id number])

## 2024-02-26 NOTE — Progress Notes (Signed)
 Subjective:  CC: thyroid labs  HPI:  [name] is a 57 y.o. female with a past medical history of HFmrHF, type 2 diabetes, HTN, HLD, neurosarcoidosis, and OSA who presents today for follow-up on thyroid.   Please see problem based assessment and plan for additional details.  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   followed by pcp;    s/p total thyroidecotmy ,  hurthle cell caricnoma,  no radiation/ chemo   Nephrolithiasis    02-23-2023  currently has nonobstuctive stones   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   neurologist--- dr Lucia Gaskins;   s/p suboccipital craniotomy resection tumor 10-30-2000 by dr Newell Coral ;  intermittant symptoms dizziness worsening nocturnal headaches/ migraines;  treated with methotrate   Nonischemic cardiomyopathy (HCC)    followed by cardiology---   cardiac MRI 01-27-2020 ef 44%;  echo 08-06-2022  ef 60%   Osteoporosis    Peripheral neuropathy    per pt intermittantly feet   Rectocele    Stress incontinence due to pelvic organ prolapse    Type 2 diabetes mellitus (HCC)    followed by pcp   (02-24-2023  per pt does not check blood sugar)   Wears contact lenses     MEDICATIONS:  Methotrexate 2.5 mg Amlodipine 10 mg  Oxycodone 15 mg Metformin 1000 mg twice daily Jardiance 10 mg Losartan 100 mg Emgality every 30 days Synthroid 125 mcg Mounjaro 2.5 mg weekly Atorvastatin 20 mg Carvedilol 6.25 mg twice daily Aspirin 81 mg  Family  History  Problem Relation Age of Onset   Heart disease Mother        questionable CAD and arrythmias    Cancer Mother        Breast cancer   Breast cancer Mother 37   Venous thrombosis Sister    Diabetes Sister    Heart disease Sister    Diabetes Brother    Heart disease Brother    Cancer Maternal Aunt    Breast cancer Maternal Aunt    Venous thrombosis Sister    Diabetes Sister    Heart disease Sister     Past Surgical History:  Procedure Laterality Date   BREAST EXCISIONAL BIOPSY Bilateral    yrs ago;  per pt benign   CESAREAN SECTION     x5   last one 1993   CRANIOTOMY POSTERIOR FOSSA  10/30/2000   @[facility]  by dr Newell Coral;   SUBOCCIPITAL CRANIOTOMY AND RESECTION OF TUMOR POSTERIOR FOSSA MASS   CYSTOSCOPY WITH RETROGRADE PYELOGRAM, URETEROSCOPY AND STENT PLACEMENT Left 08/15/2011   @[facility]  by dr Brunilda Payor   CYSTOSCOPY/RETROGRADE/URETEROSCOPY/STONE EXTRACTION WITH BASKET  03/13/2006   @[facility]  by dr Brunilda Payor   HYSTEROSCOPY W/ ENDOMETRIAL ABLATION  07/28/2007   @[facility]   by dr Gaynell Face;   novasure ablation   LUMBAR LAMINECTOMY/DECOMPRESSION MICRODISCECTOMY Left 08/20/2016   Procedure: Left Lumbar four-five Microdiskectomy;  Surgeon: Hilda Lias, MD;  Location: [facility] NEURO  ORS;  Service: Neurosurgery;  Laterality: Left;   RECTOCELE REPAIR N/A 02/26/2023   Procedure: POSTERIOR REPAIR (RECTOCELE);  Surgeon: Marguerita Beards, MD;  Location: [facility];  Service: Gynecology;  Laterality: N/A;  Total time requested is 1 hour   ROTATOR CUFF REPAIR Right 07/2013   TOTAL ABDOMINAL HYSTERECTOMY  12/2021   @[facility] ;   w/  bilateral salpingectomy and c/s scar revision   TOTAL THYROIDECTOMY Bilateral 12/09/2010   @[facility]  by dr Derrell Lolling     Social History   Socioeconomic History   Marital status: Married    Spouse name: Stephenie Acres   Number of children: 4   Years of education: 12   Highest education level: Not on file  Occupational History    Employer: UNEMPLOYED  Tobacco Use   Smoking  status: Never   Smokeless tobacco: Never  Vaping Use   Vaping status: Never Used  Substance and Sexual Activity   Alcohol use: No    Alcohol/week: 0.0 standard drinks of alcohol   Drug use: Never   Sexual activity: Not on file    Comment: hysterectomy 2013  Other Topics Concern   Not on file  Social History Narrative   Lives in [place] alone    Drinks very little caffeine   Patient is right handed.    Social Drivers of Health   Financial Resource Strain: Patient Declined (02/22/2024)   Received from [facility]   Overall Financial Resource Strain (CARDIA)    Difficulty of Paying Living Expenses: Patient declined  Food Insecurity: Patient Declined (02/22/2024)   [facility]   Hunger Vital Sign    Worried About Running Out of Food in the Last Year: Patient declined    Ran Out of Food in the Last Year: Patient declined  Transportation Needs: Patient Declined (02/22/2024)   Received from [facility] - Transportation    Lack of Transportation (Medical): Patient declined    Lack of Transportation (Non-Medical): Patient declined  Physical Activity: Not on file  Stress: Not on file  Social Connections: Moderately Integrated (12/09/2022)   Social Connection and Isolation Panel [NHANES]    Frequency of Communication with Friends and Family: More than three times a week    Frequency of Social Gatherings with Friends and Family: Three times a week    Attends Religious Services: 1 to 4 times per year    Active Member of Clubs or Organizations: No    Attends Banker Meetings: 1 to 4 times per year    Marital Status: Separated  Intimate Partner Violence: Not At Risk (12/09/2022)   Humiliation, Afraid, Rape, and Kick questionnaire    Fear of Current or Ex-Partner: No    Emotionally Abused: No    Physically Abused: No    Sexually Abused: No    Review of Systems: ROS negative except for what is noted on the assessment and plan.  Objective:    Vitals:   02/26/24 1045  BP: 116/74  Pulse: 80  Temp: 97.9 F (36.6 C)  TempSrc: Oral  SpO2: 100%  Weight: 195 lb 6.4 oz (88.6 kg)  Height: 5\' 8"  (1.727 m)    Physical Exam: Constitutional: well-appearing, in no acute distress Cardiovascular: regular rate and rhythm, no m/r/g Pulmonary/Chest: normal work of breathing on room air, lungs clear to auscultation bilaterally Abdominal: soft, non-tender, non-distended MSK: normal bulk and tone Neurological: alert & oriented x 3, normal gait Skin: warm and dry   Assessment &  Plan:  Hypothyroidism Thyroid medication was decreased at last office visit as TSH was low and free T4 elevated at 1.85.  Synthyroid was decreased from 137 mcg to 125 mcg.  Labs rechecked and TSH marginally higher but remains below normal.  Free T4 remains elevated. P: Decrease Synthroid from 125 mcg to 100 Follow-up in 4-6 weeks for lab only visit. TSH, free t4 future order placed  Controlled diabetes mellitus type 2, (HCC) She started on Mounjaro 2.5 mg at last visit in February.  She is doing well on current dosing.  She is lost about 5 pounds.  A1c well-controlled at 6.3%.  Plan Continue metformin 1 g twice daily Continue Jardiance 10 mg Increase Mounjaro from 2.5 mg weekly to 5 mg weekly Diabetes has been very well-controlled the last 2 years.  Would plan to recheck A1c in August.    Patient discussed with Dr. Cleda Daub   Ruven Corradi, D.O. [facility]  PGY-3 Pager: [phone]  Phone: [phone] Date 02/27/2024  Time 8:44 PM

## 2024-02-26 NOTE — Patient Instructions (Addendum)
 Thank you, [name] for allowing [name] to provide your care today.   Thyroid Checking your thyroid labs and will call with results next week.  Diabetes Your diabetes blood work is look well-controlled for several years.  I think follow-up in 5 months to repeat that blood work.  I have sent in Mounjaro 5 mg weekly if you have side effects please call us and we can go back down to the lower dosing  I have attached some information for grief counseling.  I have ordered the following labs for you:  Lab Orders         TSH         T4, Free      I have ordered the following medication/changed the following medications:   Stop the following medications: Medications Discontinued During This Encounter  Medication Reason   tirzepatide Greggory Keen) 2.5 MG/0.5ML Pen      Start the following medications: Meds ordered this encounter  Medications   tirzepatide (MOUNJARO) 5 MG/0.5ML Pen    Sig: Inject 5 mg into the skin once a week.    Dispense:  2 mL    Refill:  3     Follow up: 5 months for diabetes  We look forward to seeing you next time. Please call our clinic at [phone] if you have any questions or concerns. The best time to call is Monday-Friday from 9am-4pm, but there is someone available 24/7. If after hours or the weekend, call the main hospital number and ask for the Internal Medicine Resident On-Call. If you need medication refills, please notify your pharmacy one week in advance and they will send [name] a request.   Thank you for trusting me with your care. Wishing you the best!   Rudene Christians, DO [facility]

## 2024-02-27 ENCOUNTER — Encounter: Payer: Self-pay | Admitting: Internal Medicine

## 2024-02-27 LAB — TSH: TSH: 0.439 u[IU]/mL — ABNORMAL LOW (ref 0.450–4.500)

## 2024-02-27 LAB — T4, FREE: Free T4: 1.91 ng/dL — ABNORMAL HIGH (ref 0.82–1.77)

## 2024-02-27 MED ORDER — LEVOTHYROXINE SODIUM 100 MCG PO TABS: 100.0000 ug | ORAL_TABLET | Freq: Every day | ORAL | 11 refills | Status: DC

## 2024-02-27 NOTE — Assessment & Plan Note (Signed)
 She started on Mounjaro 2.5 mg at last visit in February.  She is doing well on current dosing.  She is lost about 5 pounds.  A1c well-controlled at 6.3%.  Plan Continue metformin 1 g twice daily Continue Jardiance 10 mg Increase Mounjaro from 2.5 mg weekly to 5 mg weekly Diabetes has been very well-controlled the last 2 years.  Would plan to recheck A1c in August.

## 2024-02-27 NOTE — Assessment & Plan Note (Signed)
 Thyroid medication was decreased at last office visit as TSH was low and free T4 elevated at 1.85.  Synthyroid was decreased from 137 mcg to 125 mcg.  Labs rechecked and TSH marginally higher but remains below normal.  Free T4 remains elevated. P: Decrease Synthroid from 125 mcg to 100 Follow-up in 4-6 weeks for lab only visit. TSH, free t4 future order placed

## 2024-02-28 NOTE — Progress Notes (Signed)
 Internal Medicine Clinic Attending  Case discussed with the resident at the time of the visit.  We reviewed the resident's history and exam and pertinent patient test results.  I agree with the assessment, diagnosis, and plan of care documented in the resident's note.

## 2024-02-29 NOTE — Telephone Encounter (Signed)
 [name] [name] ([name] [name]) Rx #: [id number] [name] [name] 5MG /0.5ML auto-injectors Form [employer] Medicaid of [employer] Electronic Prior Authorization Request Form [phone] NCPDP) Created Sent to Plan Plan Response Submit Clinical Questions Determination Favorable Message from Plan Approved. This drug has been approved. Approved quantity: 2 milliliters per 28 day(s). You may fill up to a 34 day supply at a retail pharmacy. You may fill up to a 90 day supply for maintenance drugs, please refer to the formulary for details. Please call the pharmacy to process your prescription claim.. Authorization Expiration Date: May 26, 2024.

## 2024-03-01 DIAGNOSIS — M79662 Pain in left lower leg: Secondary | ICD-10-CM | POA: Diagnosis not present

## 2024-03-01 DIAGNOSIS — M25512 Pain in left shoulder: Secondary | ICD-10-CM | POA: Diagnosis not present

## 2024-03-01 DIAGNOSIS — G894 Chronic pain syndrome: Secondary | ICD-10-CM | POA: Diagnosis not present

## 2024-03-01 DIAGNOSIS — M961 Postlaminectomy syndrome, not elsewhere classified: Secondary | ICD-10-CM | POA: Diagnosis not present

## 2024-03-01 DIAGNOSIS — Z79891 Long term (current) use of opiate analgesic: Secondary | ICD-10-CM | POA: Diagnosis not present

## 2024-03-12 DIAGNOSIS — Z419 Encounter for procedure for purposes other than remedying health state, unspecified: Secondary | ICD-10-CM | POA: Diagnosis not present

## 2024-03-15 ENCOUNTER — Encounter: Payer: Self-pay | Admitting: Nurse Practitioner

## 2024-03-15 DIAGNOSIS — E1136 Type 2 diabetes mellitus with diabetic cataract: Secondary | ICD-10-CM | POA: Diagnosis not present

## 2024-03-15 DIAGNOSIS — M1990 Unspecified osteoarthritis, unspecified site: Secondary | ICD-10-CM | POA: Diagnosis not present

## 2024-03-15 DIAGNOSIS — E1142 Type 2 diabetes mellitus with diabetic polyneuropathy: Secondary | ICD-10-CM | POA: Diagnosis not present

## 2024-03-15 DIAGNOSIS — I700 Atherosclerosis of aorta: Secondary | ICD-10-CM | POA: Diagnosis not present

## 2024-03-15 DIAGNOSIS — G834 Cauda equina syndrome: Secondary | ICD-10-CM | POA: Diagnosis not present

## 2024-03-15 DIAGNOSIS — I255 Ischemic cardiomyopathy: Secondary | ICD-10-CM | POA: Diagnosis not present

## 2024-03-15 DIAGNOSIS — Z7984 Long term (current) use of oral hypoglycemic drugs: Secondary | ICD-10-CM | POA: Diagnosis not present

## 2024-03-15 DIAGNOSIS — K219 Gastro-esophageal reflux disease without esophagitis: Secondary | ICD-10-CM | POA: Diagnosis not present

## 2024-03-15 DIAGNOSIS — I10 Essential (primary) hypertension: Secondary | ICD-10-CM | POA: Diagnosis not present

## 2024-03-15 DIAGNOSIS — M810 Age-related osteoporosis without current pathological fracture: Secondary | ICD-10-CM | POA: Diagnosis not present

## 2024-03-15 DIAGNOSIS — Z7982 Long term (current) use of aspirin: Secondary | ICD-10-CM | POA: Diagnosis not present

## 2024-03-16 ENCOUNTER — Ambulatory Visit: Payer: Self-pay | Admitting: *Deleted

## 2024-03-16 NOTE — Telephone Encounter (Signed)
 Copied from CRM [id number]. Topic: Clinical - Red Word Triage >> Mar 16, 2024  8:41 AM Jayson Michael wrote: [facility] that prompted transfer to Nurse Triage: Patient reports severe abdominal pain and burning for a little over 2 weeks. Concerned it may be an ulcer due to history. She has been eating smaller portions to manage the discomfort. Reason for Disposition  [1] MODERATE pain (e.g., interferes with normal activities) AND [2] pain comes and goes (cramps) AND [3] present > 24 hours  (Exception: Pain with Vomiting or Diarrhea - see that Guideline.)  Answer Assessment - Initial Assessment Questions 1. LOCATION: "Where does it hurt?"      I'm having stomach pain.   I've had an ulcer before.   It's been a long time since this has happened.   They wanted to do an emergency colonoscopy 2 weeks ago but my insurance was not accepted.   So it was not done. [facility] nurse came to my house yesterday. I had a death in my family and I lost my appetite.  It was a violent death.   I've not been eating much.    My stomach has been hurting and burning in the top of my stomach like when I had the ulcer before. I'm nauseas too.       The nurse came to my house yesterday from [facility] and checked me.     She heard something in my lungs and told me to take Mucinex.    She set me up with [facility].  It's to see a doctor for a colonoscopy, I think.   It's a GI doctor.   I see them May 11, 2024.  I've always had a problem with having BMs.     2. RADIATION: "Does the pain shoot anywhere else?" (e.g., chest, back)     It's in the top middle part of my stomach burning and nausea.    3. ONSET: "When did the pain begin?" (e.g., minutes, hours or days ago)      Started about 3-4 weeks ago 4. SUDDEN: "Gradual or sudden onset?"     Not asked 5. PATTERN "Does the pain come and go, or is it constant?"    - If it comes and goes: "How long does it last?" "Do you have pain now?"     (Note: Comes and goes means the pain is  intermittent. It goes away completely between bouts.)    - If constant: "Is it getting better, staying the same, or getting worse?"      (Note: Constant means the pain never goes away completely; most serious pain is constant and gets worse.)      constant 6. SEVERITY: "How bad is the pain?"  (e.g., Scale 1-10; mild, moderate, or severe)    - MILD (1-3): Doesn't interfere with normal activities, abdomen soft and not tender to touch.     - MODERATE (4-7): Interferes with normal activities or awakens from sleep, abdomen tender to touch.     - SEVERE (8-10): Excruciating pain, doubled over, unable to do any normal activities.       8/10 at the worst.   7. RECURRENT SYMPTOM: "Have you ever had this type of stomach pain before?" If Yes, ask: "When was the last time?" and "What happened that time?"      Yes   I've had an ulcer in my stomach before.   I also have problems having BMs. 8. CAUSE: "What do you think is causing  the stomach pain?"     Ulcer 9. RELIEVING/AGGRAVATING FACTORS: "What makes it better or worse?" (e.g., antacids, bending or twisting motion, bowel movement)     Nothing 10. OTHER SYMPTOMS: "Do you have any other symptoms?" (e.g., back pain, diarrhea, fever, urination pain, vomiting)       Constipation 11. PREGNANCY: "Is there any chance you are pregnant?" "When was your last menstrual period?"       N/A due to age  Protocols used: Abdominal Pain - Female-A-AH  Chief Complaint: Burning in top of stomach and constipation Symptoms: above Frequency: for last 3-4 weeks Pertinent Negatives: Patient denies She has a history of having an ulcer. Disposition: [] ED /[] Urgent Care (no appt availability in office) / [x] Appointment(In office/virtual)/ []  [place] [facility]/ [] Home Care/ [] Refused Recommended Disposition /[] [facility] [facility]/ []  Follow-up with PCP Additional Notes: Appt made with Dr. Zheng for 03/22/2024.

## 2024-03-22 ENCOUNTER — Other Ambulatory Visit: Payer: Self-pay | Admitting: Student

## 2024-03-22 ENCOUNTER — Ambulatory Visit: Payer: Self-pay | Admitting: Internal Medicine

## 2024-03-22 DIAGNOSIS — E119 Type 2 diabetes mellitus without complications: Secondary | ICD-10-CM

## 2024-03-22 NOTE — Progress Notes (Deleted)
 Subjective:  CC: abdominal pain  HPI:  [name] is a 58 y.o. female with a past medical history of HFmrHF, type 2 diabetes, HTN, HLD, neurosarcoidosis, and OSA who presents today for acute visit for severe abdominal pain and burning for last 3 weeks.  Concerned that she has a history of a gastric ulcer.   Please see problem based assessment and plan for additional details.  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   followed by pcp;    s/p total thyroidecotmy ,  hurthle cell caricnoma,  no radiation/ chemo   Nephrolithiasis    02-23-2023  currently has nonobstuctive stones   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   neurologist--- dr Tresia Fruit;   s/p suboccipital craniotomy resection tumor 10-30-2000 by dr Cipriano Creeks ;  intermittant symptoms dizziness worsening nocturnal headaches/ migraines;  treated with methotrate   Nonischemic cardiomyopathy (HCC)    followed by cardiology---   cardiac MRI 01-27-2020 ef 44%;  echo 08-06-2022  ef 60%   Osteoporosis    Peripheral neuropathy    per pt intermittantly feet   Rectocele    Stress incontinence due to pelvic organ prolapse    Type 2 diabetes mellitus (HCC)    followed by pcp   (02-24-2023  per pt does not check blood sugar)   Wears contact lenses     MEDICATIONS:  Methotrexate  0.5 mg daily Cyclosporine ophthalmic drops Amlodipine  5 mg daily Metformin  1000 mg twice daily Jardiance  10 mg daily Levothyroxine  100 mcg  daily Losartan  100 mg daily Emgality  every 30 days Atorvastatin  20 mg Mounjaro 5 mg weekly Carvedilol  6.25 mg twice daily Aspirin  81 mg daily  Family History  Problem Relation Age of Onset   Heart disease Mother        questionable CAD and arrythmias    Cancer Mother        Breast cancer   Breast cancer Mother 52   Venous thrombosis Sister    Diabetes Sister    Heart disease Sister    Diabetes Brother    Heart disease Brother    Cancer Maternal Aunt    Breast cancer Maternal Aunt    Venous thrombosis Sister    Diabetes Sister    Heart disease Sister     Past Surgical History:  Procedure Laterality Date   BREAST EXCISIONAL BIOPSY Bilateral    yrs ago;  per pt benign   CESAREAN SECTION     x5   last one 1993   CRANIOTOMY POSTERIOR FOSSA  10/30/2000   @[facility]  by dr Cipriano Creeks;   SUBOCCIPITAL CRANIOTOMY AND RESECTION OF TUMOR POSTERIOR FOSSA MASS   CYSTOSCOPY WITH RETROGRADE PYELOGRAM, URETEROSCOPY AND STENT PLACEMENT Left 08/15/2011   @[facility]  by dr Levi Real   CYSTOSCOPY/RETROGRADE/URETEROSCOPY/STONE EXTRACTION WITH BASKET  03/13/2006   @[facility]  by dr Levi Real   HYSTEROSCOPY W/ ENDOMETRIAL ABLATION  07/28/2007   @[facility]   by dr Charon Copper;  novasure ablation   LUMBAR LAMINECTOMY/DECOMPRESSION MICRODISCECTOMY Left 08/20/2016   Procedure: Left Lumbar four-five Microdiskectomy;  Surgeon: Claudetta Cuba, MD;  Location: [facility];  Service: Neurosurgery;  Laterality: Left;   RECTOCELE REPAIR N/A 02/26/2023   Procedure: POSTERIOR REPAIR (RECTOCELE);  Surgeon: Arma Lamp, MD;  Location: [facility];  Service: Gynecology;  Laterality: N/A;  Total time requested is 1 hour   ROTATOR CUFF REPAIR Right 07/2013   TOTAL ABDOMINAL HYSTERECTOMY  12/2021   @[facility] ;   w/  bilateral salpingectomy and c/s scar revision   TOTAL THYROIDECTOMY Bilateral 12/09/2010   @[facility]  by dr Lauralee Poll     Social History   Socioeconomic History   Marital status: Married    Spouse name: Dionne Frederick    Number of children: 4   Years of education: 12   Highest education level: Not on file  Occupational History    Employer: UNEMPLOYED  Tobacco Use   Smoking status: Never   Smokeless tobacco: Never  Vaping Use   Vaping status: Never Used  Substance and Sexual Activity   Alcohol use: No    Alcohol/week: 0.0 standard drinks of alcohol   Drug use: Never   Sexual activity: Not on file    Comment: hysterectomy 2013  Other Topics Concern   Not on file  Social History Narrative   Lives in [place] alone    Drinks very little caffeine   Patient is right handed.    Social Drivers of Health   Financial Resource Strain: Patient Declined (02/22/2024)   [facility]   Overall Financial Resource Strain (CARDIA)    Difficulty of Paying Living Expenses: Patient declined  Food Insecurity: Patient Declined (02/22/2024)   Received from [facility]   Hunger Vital Sign    Worried About Running Out of Food in the Last Year: Patient declined    Ran Out of Food in the Last Year: Patient declined  Transportation Needs: Patient Declined (02/22/2024)   Received from [facility]   PRAPARE - Transportation    Lack of Transportation (Medical): Patient declined    Lack of Transportation (Non-Medical): Patient declined  Physical Activity: Not on file  Stress: Not on file  Social Connections: Moderately Integrated (12/09/2022)   Social Connection and Isolation Panel [NHANES]    Frequency of Communication with Friends and Family: More than three times a week    Frequency of Social Gatherings with Friends and Family: Three times a week    Attends Religious Services: 1 to 4 times per year    Active Member of Clubs or Organizations: No    Attends Banker Meetings: 1 to 4 times per year    Marital Status: Separated  Intimate Partner Violence: Not At Risk (12/09/2022)   Humiliation, Afraid, Rape, and Kick questionnaire    Fear of Current or Ex-Partner: No    Emotionally Abused:  No    Physically Abused: No    Sexually Abused: No    Review of Systems: ROS negative except for what is noted on the assessment and plan.  Objective:  There were no vitals filed for this visit.  Physical Exam: Constitutional: well-appearing *** sitting in ***, in no acute distress HENT: normocephalic atraumatic, mucous membranes moist Eyes: conjunctiva non-erythematous Neck: supple Cardiovascular: regular rate and rhythm, no m/r/g Pulmonary/Chest: normal work of breathing on room air, lungs clear to auscultation bilaterally Abdominal: soft, non-tender, non-distended MSK: normal bulk and tone Neurological: alert & oriented x 3, 5/5 strength  in bilateral upper and lower extremities, normal gait Skin: warm and dry Psych: ***     Assessment & Plan:  No problem-specific Assessment & Plan notes found for this encounter.   Abdominal pain She has follow-up with our GI June 11 .  Colonoscopy in 2023 with no polyps masses or diverticulosis noted.  She was to have a repeat colonoscopy in 10 years.   Hypothyroidism Thyroid  was decreased from 1 2 5  mcg to 100 mcg 3/28.  She was to follow-up in 4 to 6 weeks for repeat TSH and free T4.  Patient {GC/GE:[id number]::"discussed with","seen with"} Dr. {[id number]::"WJXBJYNW","G. Hoffman","Mullen","Narendra","Machen","Vincent","Guilloud","Lau"}   [employer], D.O. [facility]  PGY-3 Pager: [phone]  Phone: [phone] Date 03/22/2024  Time 8:26 AM

## 2024-03-25 ENCOUNTER — Ambulatory Visit: Admitting: Student

## 2024-03-25 VITALS — BP 113/64 | HR 84 | Temp 98.1°F | Ht 68.0 in | Wt 192.5 lb

## 2024-03-25 DIAGNOSIS — K219 Gastro-esophageal reflux disease without esophagitis: Secondary | ICD-10-CM

## 2024-03-25 DIAGNOSIS — E890 Postprocedural hypothyroidism: Secondary | ICD-10-CM

## 2024-03-25 DIAGNOSIS — E039 Hypothyroidism, unspecified: Secondary | ICD-10-CM

## 2024-03-25 MED ORDER — FAMOTIDINE 20 MG PO TABS: 20.0000 mg | ORAL_TABLET | Freq: Two times a day (BID) | ORAL | 1 refills | Status: DC
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
## 2024-03-25 NOTE — Patient Instructions (Addendum)
 Thank you, [name] for allowing us  to provide your care today. Today we discussed   -I am glad that your abdominal burning sensation is slowly getting better. Glad to hear no more nausea or vomiting. -We will start with famotidine 20 mg twice a day for the burning sensation. -Lets check back in 3-4 weeks to see how you are doing and if further testing needs to be done.  -Please make sure to stay hydrated to address your constipation.   -You have appt with [facility] in June.     I have ordered the following medication/changed the following medications:  Start the following medications: Meds ordered this encounter  Medications   famotidine (PEPCID) 20 MG tablet    Sig: Take 1 tablet (20 mg total) by mouth 2 (two) times daily.    Dispense:  60 tablet    Refill:  1     Follow up:  around 4 weeks    Should you have any questions or concerns please call the internal medicine clinic at [phone].    Ayva Veilleux, D.O. [facility]
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
## 2024-03-26 ENCOUNTER — Encounter: Payer: Self-pay | Admitting: Student

## 2024-03-26 NOTE — Assessment & Plan Note (Signed)
 At end of visit, patient had mentioned she is still taking levothyroxine  125 mcg daily instead of the decreased dose of 100 mcg as discussed at last visit. Recent labs suggest medication induced hyperthyroidism. Discussed with patient to take levothyroxine  100 mcg and next visit to repeat TSH and free T4.

## 2024-03-29 DIAGNOSIS — M79662 Pain in left lower leg: Secondary | ICD-10-CM | POA: Diagnosis not present

## 2024-03-29 DIAGNOSIS — M25512 Pain in left shoulder: Secondary | ICD-10-CM | POA: Diagnosis not present

## 2024-03-29 DIAGNOSIS — M961 Postlaminectomy syndrome, not elsewhere classified: Secondary | ICD-10-CM | POA: Diagnosis not present

## 2024-03-29 DIAGNOSIS — G894 Chronic pain syndrome: Secondary | ICD-10-CM | POA: Diagnosis not present

## 2024-03-29 DIAGNOSIS — Z79891 Long term (current) use of opiate analgesic: Secondary | ICD-10-CM | POA: Diagnosis not present

## 2024-03-30 NOTE — Progress Notes (Signed)
 Internal Medicine Clinic Attending  Case discussed with the resident at the time of the visit.  We reviewed the resident's history and exam and pertinent patient test results.  I agree with the assessment, diagnosis, and plan of care documented in the resident's note.

## 2024-04-11 DIAGNOSIS — Z419 Encounter for procedure for purposes other than remedying health state, unspecified: Secondary | ICD-10-CM | POA: Diagnosis not present

## 2024-04-18 ENCOUNTER — Other Ambulatory Visit: Payer: Self-pay | Admitting: Student

## 2024-04-18 NOTE — Telephone Encounter (Signed)
 Medication sent to pharmacy

## 2024-04-23 ENCOUNTER — Other Ambulatory Visit: Payer: Self-pay | Admitting: Student

## 2024-04-26 ENCOUNTER — Encounter: Payer: Medicaid Other | Admitting: Student

## 2024-04-26 ENCOUNTER — Telehealth: Payer: Self-pay

## 2024-04-26 DIAGNOSIS — M79662 Pain in left lower leg: Secondary | ICD-10-CM | POA: Diagnosis not present

## 2024-04-26 DIAGNOSIS — G894 Chronic pain syndrome: Secondary | ICD-10-CM | POA: Diagnosis not present

## 2024-04-26 DIAGNOSIS — Z79891 Long term (current) use of opiate analgesic: Secondary | ICD-10-CM | POA: Diagnosis not present

## 2024-04-26 DIAGNOSIS — M25512 Pain in left shoulder: Secondary | ICD-10-CM | POA: Diagnosis not present

## 2024-04-26 DIAGNOSIS — M961 Postlaminectomy syndrome, not elsewhere classified: Secondary | ICD-10-CM | POA: Diagnosis not present

## 2024-04-26 DIAGNOSIS — G8929 Other chronic pain: Secondary | ICD-10-CM | POA: Diagnosis not present

## 2024-04-26 NOTE — Telephone Encounter (Signed)
 I called pt to resch her an appt that she missed today (04/26/2024). Pt did not answer her phone, lvm to call the clinic.

## 2024-04-26 NOTE — Progress Notes (Deleted)
 CC: ***  HPI:  [name] is a 58 y.o. female with a past medical history of hypertension, chronic migraines, GERD, congestive heart failure, hypothyroidism, type 2 diabetes who presents for 12-month follow-up appointment.  Please see assessment and plan for full HPI.  Medications: Reactive airway disease: Albuterol  2 puffs every 6 hours as needed Hypertension: Amlodipine  5 mg daily, losartan  100 mg daily CAD: Aspirin  81 mg daily, Lipitor 20 mg nightly HFmrEF: Coreg  6.25 mg twice daily, Jardiance  10 mg daily, losartan  100 mg daily Pain: Flexeril  5 mg 3 times daily as needed Diabetes: Jardiance  10 mg daily, metformin  1000 mg twice daily, Mounjaro 5 mg weekly GERD: Pepcid  20 mg twice daily Allergic rhinitis: Flonase  Folic acid  deficiency: Folic acid  supplementation Migraines: Emgality  120 mg monthly  Hypothyroidism: Synthroid  100 mcg daily Neurosarcoidosis: Methotrexate  2.5 mg weekly, oxycodone  15 mg 1-2 mg every 6 hours as needed Constipation: Metamucil, MiraLAX   Last seen in the clinic on 0/25/25.  At that time patient had abdominal pain.  Patient was started on famotidine .  Patient instructed to come back in 4 weeks.  She states that if she does not get better we will need potential H. pylori testing.  Due to last week patient is doing better today.  Patient at time discussed levothyroxine .  Patient instructed to take 100 mcg instead of 125 given she was taking more than prescribed.  Addressed diabetes today.  Eye exam Foot exam Tdap  Ask how GERD is doing. 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   followed by pcp;    s/p total thyroidecotmy ,  hurthle cell caricnoma,  no radiation/ chemo   Nephrolithiasis    02-23-2023  currently has nonobstuctive stones   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   neurologist--- dr Tresia Fruit;   s/p suboccipital craniotomy resection tumor 10-30-2000 by dr Cipriano Creeks ;  intermittant symptoms dizziness worsening nocturnal headaches/ migraines;  treated with methotrate   Nonischemic cardiomyopathy (HCC)    followed by cardiology---   cardiac MRI 01-27-2020 ef 44%;  echo 08-06-2022  ef 60%   Osteoporosis    Peripheral neuropathy    per pt intermittantly feet   Rectocele    Stress incontinence due to pelvic organ prolapse    Type 2 diabetes mellitus (HCC)    followed by pcp   (02-24-2023  per pt does not check blood sugar)   Wears contact lenses      Current Outpatient Medications:    ACCU-CHEK GUIDE test strip, USE AS INSTRUCTED TO CHECK BLOOD SUGAR UP TO 1 TIME A DAY, Disp: 50 strip, Rfl: 7   albuterol  (VENTOLIN  HFA) 108 (90 Base) MCG/ACT inhaler, Inhale 2 puffs into the lungs every 6 (six) hours as needed for wheezing or shortness of breath., Disp: 18 g, Rfl: 1   amLODipine  (NORVASC ) 5 MG tablet, Take 1 tablet by mouth at bedtime., Disp: ,  Rfl:    APPLE CIDER VINEGAR PO, Take by mouth daily. chew, Disp: , Rfl:    aspirin  81 MG chewable tablet, Chew 81 mg by mouth daily., Disp: , Rfl:    atorvastatin  (LIPITOR) 20 MG tablet, Take 1 tablet (20 mg total) by mouth at bedtime., Disp: 90 tablet, Rfl: 3   B-D ULTRAFINE III SHORT PEN 31G X 8 MM MISC, USE TO INJECT INSULIN  1 TIME DAILY DIAG CODE E11.9 INSULIN  DEPENDENT, Disp: 100 each, Rfl: 4   blood glucose meter kit and supplies KIT, Dispense based on patient and insurance preference. Use up to four times daily as directed. (FOR ICD-9 250.00,  250.01)., Disp: 1 each, Rfl: 0   carvedilol  (COREG ) 6.25 MG tablet, Take 1 tablet (6.25 mg total) by mouth 2 (two) times daily with a meal., Disp: 180 tablet, Rfl: 3   cyclobenzaprine  (FLEXERIL ) 5 MG tablet, Take 1 tablet (5 mg total) by mouth 3 (three) times daily as needed for muscle spasms., Disp: 60 tablet, Rfl: 0   cycloSPORINE (RESTASIS) 0.05 % ophthalmic emulsion, Place 1 drop into both eyes 2 (two) times daily., Disp: , Rfl:    empagliflozin  (JARDIANCE ) 10 MG TABS tablet, Take 1 tablet (10 mg total) by mouth daily., Disp: 90 tablet, Rfl: 3   famotidine  (PEPCID ) 20 MG tablet, TAKE 1 TABLET BY MOUTH TWICE A DAY, Disp: 180 tablet, Rfl: 1   fluticasone  (FLONASE ) 50 MCG/ACT nasal spray, SPRAY 1 SPRAY INTO BOTH NOSTRILS DAILY. (Patient taking differently: Place 1 spray into both nostrils at bedtime.), Disp: 16 mL, Rfl: 5   folic acid  (FOLVITE ) 1 MG tablet, Take 1 tablet (1 mg total) by mouth daily., Disp: 90 tablet, Rfl: 3   Galcanezumab -gnlm (EMGALITY ) 120 MG/ML SOAJ, Inject 120 mg into the skin every 30 (thirty) days., Disp: 1.12 mL, Rfl: 11   levothyroxine  (SYNTHROID ) 100 MCG tablet, Take 1 tablet (100 mcg total) by mouth daily., Disp: 30 tablet, Rfl: 11   losartan  (COZAAR ) 100 MG tablet, Take 1 tablet (100 mg total) by mouth daily., Disp: 90 tablet, Rfl: 3   Metamucil Fiber CHEW, Chew by mouth 2 (two) times daily., Disp: , Rfl:    metFORMIN  (GLUCOPHAGE ) 1000 MG tablet, TAKE 1 TABLET (1,000 MG TOTAL) BY MOUTH TWICE A DAY WITH FOOD, Disp: 180 tablet, Rfl: 3   methotrexate  (RHEUMATREX) 2.5 MG tablet, TAKE 1 TABLET BY MOUTH ONCE A WEEK. CAUTION:CHEMOTHERAPY. PROTECT FROM LIGHT. STRENGTH: 2.5 MG, Disp: 12 tablet, Rfl: 0   Multiple Vitamin (MULTIVITAMIN WITH MINERALS) TABS tablet, Take 1 tablet by mouth every evening. , Disp: , Rfl:    oxyCODONE  (ROXICODONE ) 15 MG immediate release tablet, Take 1-2 tablets by mouth every 6 (six) hours., Disp: , Rfl:    polyethylene glycol powder (GLYCOLAX /MIRALAX ) 17  GM/SCOOP powder, Take 17 g by mouth daily. Drink 17g (1 scoop) dissolved in water per day., Disp: 510 g, Rfl: 11   psyllium (METAMUCIL) 58.6 % packet, Take 1 packet by mouth daily., Disp: 30 each, Rfl: 11   tirzepatide (MOUNJARO) 5 MG/0.5ML Pen, Inject 5 mg into the skin once a week., Disp: 2 mL, Rfl: 3  Review of Systems:  ***  Constitutional: Eye: Respiratory: Cardiovascular: GI: MSK: GU: Skin: Neuro: Endocrine:   Physical Exam:  There were no vitals filed for this visit. *** General: Patient is sitting comfortably in the room  Eyes: Pupils equal and reactive to light, EOM intact  Head: Normocephalic, atraumatic  Neck: Supple, nontender, full range of motion, No JVD Cardio:  Regular rate and rhythm, no murmurs, rubs or gallops. 2+ pulses to bilateral upper and lower extremities  Chest: No chest tenderness Pulmonary: Clear to ausculation bilaterally with no rales, rhonchi, and crackles  Abdomen: Soft, nontender with normoactive bowel sounds with no rebound or guarding  Neuro: Alert and orientated x3. CN II-XII intact. Sensation intact to upper and lower extremities. 2+ patellar reflex.  Back: No midline tenderness, no step off or deformities noted. No paraspinal muscle tenderness.  Skin: No rashes noted  MSK: 5/5 strength to upper and lower extremities.    Assessment & Plan:   No problem-specific Assessment & Plan notes found for this encounter.    Patient {GC/GE:[id number]::"discussed with","seen with"} Dr. {NAMES:[id number]::"Guilloud","Hoffman","Mullen","Narendra","Williams","Vincent"}  Jonelle Neri, DO PGY-2 Internal Medicine Resident

## 2024-05-09 ENCOUNTER — Other Ambulatory Visit: Payer: Self-pay

## 2024-05-09 MED ORDER — FOLIC ACID 1 MG PO TABS: 1.0000 mg | ORAL_TABLET | Freq: Every day | ORAL | 3 refills | Status: DC

## 2024-05-11 ENCOUNTER — Ambulatory Visit: Admitting: Nurse Practitioner

## 2024-05-12 DIAGNOSIS — Z419 Encounter for procedure for purposes other than remedying health state, unspecified: Secondary | ICD-10-CM | POA: Diagnosis not present

## 2024-05-15 ENCOUNTER — Other Ambulatory Visit: Payer: Self-pay | Admitting: Student

## 2024-05-16 ENCOUNTER — Telehealth: Payer: Self-pay

## 2024-05-16 NOTE — Telephone Encounter (Signed)
 [name] [name] (Key: Portia Brittle) Mounjaro  5MG /0.5ML auto-injectors Form [employer] [place]  Electronic Prior Authorization Request Form (2017 NCPDP) Created Sent to Plan Plan Response Submit Clinical Questions Determination Message from Plan Prior Authorization Not Required

## 2024-05-16 NOTE — Telephone Encounter (Signed)
 Prior Authorization for patient (Mounjaro  5MG /0.5ML auto-injectors) came through on cover my meds was submitted awaiting approval or denial  ZOX:[id number]

## 2024-05-18 ENCOUNTER — Telehealth: Payer: Self-pay | Admitting: *Deleted

## 2024-05-18 ENCOUNTER — Other Ambulatory Visit: Payer: Self-pay | Admitting: Student

## 2024-05-18 MED ORDER — METHOTREXATE SODIUM 2.5 MG PO TABS: 2.5000 mg | ORAL_TABLET | ORAL | 3 refills | Status: AC

## 2024-05-18 NOTE — Telephone Encounter (Signed)
 Call to Pharmacy.  Prescription was not received for the Methotreate.  Needs ro be resubmitted. Copied from CRM [id number]. Topic: Clinical - Prescription Issue >> May 18, 2024  9:41 AM Crispin Dolphin wrote: Reason for CRM: Patient called states She currently does not have neurologist and she requested a refill for methotrexate  (RHEUMATREX) 2.5 MG tablet and they pharmacy told her to contact her PCP. Per chart it was denied for not appropriate - show 12 sent 5/27. Patient states pharmacy not showing this and only gave her one. Can someone reach out to pharmacy? Thank You

## 2024-05-19 NOTE — Telephone Encounter (Signed)
 Call to patient message was left that her prescription for the Methotrexate  has been refilled.

## 2024-05-24 DIAGNOSIS — G894 Chronic pain syndrome: Secondary | ICD-10-CM | POA: Diagnosis not present

## 2024-05-24 DIAGNOSIS — M961 Postlaminectomy syndrome, not elsewhere classified: Secondary | ICD-10-CM | POA: Diagnosis not present

## 2024-05-24 DIAGNOSIS — M25512 Pain in left shoulder: Secondary | ICD-10-CM | POA: Diagnosis not present

## 2024-05-24 DIAGNOSIS — M5136 Other intervertebral disc degeneration, lumbar region with discogenic back pain only: Secondary | ICD-10-CM | POA: Diagnosis not present

## 2024-05-24 DIAGNOSIS — Z79891 Long term (current) use of opiate analgesic: Secondary | ICD-10-CM | POA: Diagnosis not present

## 2024-06-11 DIAGNOSIS — Z419 Encounter for procedure for purposes other than remedying health state, unspecified: Secondary | ICD-10-CM | POA: Diagnosis not present

## 2024-06-21 DIAGNOSIS — Z79891 Long term (current) use of opiate analgesic: Secondary | ICD-10-CM | POA: Diagnosis not present

## 2024-06-21 DIAGNOSIS — G894 Chronic pain syndrome: Secondary | ICD-10-CM | POA: Diagnosis not present

## 2024-06-21 DIAGNOSIS — M25512 Pain in left shoulder: Secondary | ICD-10-CM | POA: Diagnosis not present

## 2024-06-21 DIAGNOSIS — G8929 Other chronic pain: Secondary | ICD-10-CM | POA: Diagnosis not present

## 2024-06-21 DIAGNOSIS — M79662 Pain in left lower leg: Secondary | ICD-10-CM | POA: Diagnosis not present

## 2024-06-28 ENCOUNTER — Other Ambulatory Visit: Payer: Self-pay | Admitting: Student

## 2024-06-28 DIAGNOSIS — E119 Type 2 diabetes mellitus without complications: Secondary | ICD-10-CM

## 2024-06-28 MED ORDER — MOUNJARO 5 MG/0.5ML ~~LOC~~ SOAJ: 5.0000 mg | SUBCUTANEOUS | 3 refills | Status: DC

## 2024-06-28 NOTE — Telephone Encounter (Signed)
 Copied from CRM [id number]. Topic: Clinical - Medication Refill >> Jun 28, 2024  3:05 PM Carrielelia G wrote: Medication: tirzepatide  (MOUNJARO ) 5 MG/0.5ML Pen  Has the patient contacted their pharmacy? Yes (Agent: If no, request that the patient contact the pharmacy for the refill. If patient does not wish to contact the pharmacy document the reason why and proceed with request.) (Agent: If yes, when and what did the pharmacy advise?)  This is the patient's preferred pharmacy:  [id number] - [place], [place] - [address] [place] [address] Phone: [phone] Fax: [phone]   Is this the correct pharmacy for this prescription? Yes If no, delete pharmacy and type the correct one.   Has the prescription been filled recently? Yes  Is the patient out of the medication? Yes  Has the patient been seen for an appointment in the last year OR does the patient have an upcoming appointment? Yes  Can we respond through MyChart? Yes  Agent: Please be advised that Rx refills may take up to 3 business days. We ask that you follow-up with your pharmacy.

## 2024-07-11 ENCOUNTER — Encounter: Payer: Self-pay | Admitting: Student

## 2024-07-11 ENCOUNTER — Other Ambulatory Visit: Payer: Self-pay | Admitting: Neurology

## 2024-07-11 DIAGNOSIS — Z1231 Encounter for screening mammogram for malignant neoplasm of breast: Secondary | ICD-10-CM

## 2024-07-12 ENCOUNTER — Ambulatory Visit
Admission: RE | Admit: 2024-07-12 | Discharge: 2024-07-12 | Disposition: A | Discharge status: Home / Self Care | Source: Ambulatory Visit | Attending: Neurology | Admitting: Neurology

## 2024-07-12 DIAGNOSIS — Z1231 Encounter for screening mammogram for malignant neoplasm of breast: Secondary | ICD-10-CM | POA: Diagnosis not present

## 2024-07-12 DIAGNOSIS — Z419 Encounter for procedure for purposes other than remedying health state, unspecified: Secondary | ICD-10-CM | POA: Diagnosis not present

## 2024-07-14 ENCOUNTER — Encounter: Payer: Self-pay | Admitting: Neurology

## 2024-07-15 ENCOUNTER — Ambulatory Visit: Payer: Self-pay | Admitting: Student

## 2024-07-19 ENCOUNTER — Ambulatory Visit: Admitting: Nurse Practitioner

## 2024-07-19 DIAGNOSIS — M25512 Pain in left shoulder: Secondary | ICD-10-CM | POA: Diagnosis not present

## 2024-07-19 DIAGNOSIS — M5136 Other intervertebral disc degeneration, lumbar region with discogenic back pain only: Secondary | ICD-10-CM | POA: Diagnosis not present

## 2024-07-19 DIAGNOSIS — Z79891 Long term (current) use of opiate analgesic: Secondary | ICD-10-CM | POA: Diagnosis not present

## 2024-07-19 DIAGNOSIS — M79662 Pain in left lower leg: Secondary | ICD-10-CM | POA: Diagnosis not present

## 2024-07-19 DIAGNOSIS — G894 Chronic pain syndrome: Secondary | ICD-10-CM | POA: Diagnosis not present

## 2024-07-22 ENCOUNTER — Encounter (HOSPITAL_COMMUNITY): Payer: Self-pay | Admitting: *Deleted

## 2024-07-22 ENCOUNTER — Other Ambulatory Visit: Payer: Self-pay

## 2024-07-22 ENCOUNTER — Emergency Department (HOSPITAL_COMMUNITY)

## 2024-07-22 ENCOUNTER — Emergency Department (HOSPITAL_COMMUNITY)
Admission: EM | Admit: 2024-07-22 | Discharge: 2024-07-22 | Disposition: A | Discharge status: Home / Self Care | Attending: Emergency Medicine | Admitting: Emergency Medicine

## 2024-07-22 DIAGNOSIS — Z79899 Other long term (current) drug therapy: Secondary | ICD-10-CM | POA: Insufficient documentation

## 2024-07-22 DIAGNOSIS — K5900 Constipation, unspecified: Secondary | ICD-10-CM | POA: Diagnosis not present

## 2024-07-22 DIAGNOSIS — K429 Umbilical hernia without obstruction or gangrene: Secondary | ICD-10-CM | POA: Diagnosis not present

## 2024-07-22 DIAGNOSIS — R0789 Other chest pain: Secondary | ICD-10-CM | POA: Insufficient documentation

## 2024-07-22 DIAGNOSIS — Z7984 Long term (current) use of oral hypoglycemic drugs: Secondary | ICD-10-CM | POA: Insufficient documentation

## 2024-07-22 DIAGNOSIS — R1030 Lower abdominal pain, unspecified: Secondary | ICD-10-CM

## 2024-07-22 DIAGNOSIS — Z7982 Long term (current) use of aspirin: Secondary | ICD-10-CM | POA: Diagnosis not present

## 2024-07-22 DIAGNOSIS — E1140 Type 2 diabetes mellitus with diabetic neuropathy, unspecified: Secondary | ICD-10-CM | POA: Insufficient documentation

## 2024-07-22 DIAGNOSIS — E039 Hypothyroidism, unspecified: Secondary | ICD-10-CM | POA: Diagnosis not present

## 2024-07-22 DIAGNOSIS — I10 Essential (primary) hypertension: Secondary | ICD-10-CM | POA: Insufficient documentation

## 2024-07-22 DIAGNOSIS — M542 Cervicalgia: Secondary | ICD-10-CM | POA: Diagnosis not present

## 2024-07-22 DIAGNOSIS — R109 Unspecified abdominal pain: Secondary | ICD-10-CM | POA: Diagnosis not present

## 2024-07-22 DIAGNOSIS — R519 Headache, unspecified: Secondary | ICD-10-CM | POA: Diagnosis not present

## 2024-07-22 DIAGNOSIS — R079 Chest pain, unspecified: Secondary | ICD-10-CM | POA: Diagnosis not present

## 2024-07-22 LAB — CBC
HCT: 39.4 % (ref 36.0–46.0)
Hemoglobin: 12.1 g/dL (ref 12.0–15.0)
MCH: 27.3 pg (ref 26.0–34.0)
MCHC: 30.7 g/dL (ref 30.0–36.0)
MCV: 88.9 fL (ref 80.0–100.0)
Platelets: 330 K/uL (ref 150–400)
RBC: 4.43 MIL/uL (ref 3.87–5.11)
RDW: 13.8 % (ref 11.5–15.5)
WBC: 5.3 K/uL (ref 4.0–10.5)
nRBC: 0 % (ref 0.0–0.2)

## 2024-07-22 LAB — BASIC METABOLIC PANEL WITH GFR
Anion gap: 10 (ref 5–15)
BUN: 8 mg/dL (ref 6–20)
CO2: 26 mmol/L (ref 22–32)
Calcium: 8.8 mg/dL — ABNORMAL LOW (ref 8.9–10.3)
Chloride: 104 mmol/L (ref 98–111)
Creatinine, Ser: 0.89 mg/dL (ref 0.44–1.00)
GFR, Estimated: >60 mL/min (ref >60)
Glucose, Bld: 90 mg/dL (ref 70–99)
Potassium: 4.2 mmol/L (ref 3.5–5.1)
Sodium: 140 mmol/L (ref 135–145)

## 2024-07-22 LAB — TROPONIN I (HIGH SENSITIVITY)
Troponin I (High Sensitivity): 3 ng/L (ref <18)
Troponin I (High Sensitivity): 2 ng/L (ref <18)

## 2024-07-22 MED ORDER — ONDANSETRON HCL 4 MG/2ML IJ SOLN
4.0000 mg | Freq: Once | INTRAMUSCULAR | Status: AC
  Administered 2024-07-22: 4 mg via INTRAVENOUS
  Filled 2024-07-22: qty 2

## 2024-07-22 MED ORDER — OMEPRAZOLE 20 MG PO CPDR: 20.0000 mg | DELAYED_RELEASE_CAPSULE | Freq: Two times a day (BID) | ORAL | 0 refills | Status: DC

## 2024-07-22 MED ORDER — IOHEXOL 350 MG/ML SOLN
75.0000 mL | Freq: Once | INTRAVENOUS | Status: AC | PRN
Start: 2024-07-22 — End: 2024-07-22
  Administered 2024-07-22: 75 mL via INTRAVENOUS

## 2024-07-22 NOTE — Discharge Instructions (Signed)
 Take the Prilosec as directed for the next 2 weeks in case this is peptic ulcer disease or esophageal reflux disease.  Make an appointment to follow-up with your doctor.  For the constipation would recommend MiraLAX  available over-the-counter take it at least twice a day for the next several days until you start having a bowel movement then once a day.  But you can take it 4 times a day it is very safe.  But once you start having a bowel movement I would just start taking it once a day.  Return for any new or worse symptoms.

## 2024-07-22 NOTE — ED Triage Notes (Signed)
 The pt has a brain tumor and for 3 days  she has had neck and the back of her head pain with chest pain also and constipation

## 2024-07-22 NOTE — ED Provider Notes (Addendum)
 [place] [facility] AT [facility] Provider Note   CSN: [id number] Arrival date & time: 07/22/24  1629     Patient presents with: Chest Pain and Brain Tumor   [name] [name] is a 58 y.o. female.   Patient with multiple complaints.  Complaint to the back of the head left side of the neck anterior chest is kind of a burning sensation and some lower quadrant abdominal pain.  Is concerned that maybe she is constipated.  Some nausea but no vomiting.  No fevers no dysuria.  Followed by internal medicine clinic.  Past medical history significant for neurosarcoidosis and a adult diagnosed in 2001.  Known history of gastroesophageal reflux disease type 2 diabetes peripheral neuropathy hypertension chronic migraines hypothyroidism postsurgical in 2012 nonischemic cardiomyopathy followed by cardiology chronic constipation history of kidney stones.  Past surgical history significant for total abdominal hysterectomy craniotomy posterior fossa in 2001 done by Dr. Alix.  Lumbar laminectomy done in 2017.  Patient has never used tobacco products.  Patient last seen in the emergency department in July 2022 for flank pain.  Patient denies any fevers any dysuria.       Prior to Admission medications   Medication Sig Start Date End Date Taking? Authorizing Provider  ACCU-CHEK GUIDE test strip USE AS INSTRUCTED TO CHECK BLOOD SUGAR UP TO 1 TIME A DAY 06/02/22   Nguyen, Quan, DO  albuterol  (VENTOLIN  HFA) 108 (90 Base) MCG/ACT inhaler Inhale 2 puffs into the lungs every 6 (six) hours as needed for wheezing or shortness of breath. 05/16/20   Marsa Barter, MD  amLODipine  (NORVASC ) 5 MG tablet Take 1 tablet by mouth at bedtime.    [provider]  APPLE CIDER VINEGAR PO Take by mouth daily. chew    [provider]  aspirin  81 MG chewable tablet Chew 81 mg by mouth daily.    [provider]  atorvastatin  (LIPITOR) 20 MG tablet Take 1 tablet (20 mg total) by  mouth at bedtime. 08/24/23   Zheng, Michael, DO  B-D ULTRAFINE III SHORT PEN 31G X 8 MM MISC USE TO INJECT INSULIN  1 TIME DAILY DIAG CODE E11.9 INSULIN  DEPENDENT 09/23/21   Demaio, Alexa, MD  blood glucose meter kit and supplies KIT Dispense based on patient and insurance preference. Use up to four times daily as directed. (FOR ICD-9 250.00, 250.01). 06/15/19   Agyei, Obed K, MD  carvedilol  (COREG ) 6.25 MG tablet Take 1 tablet (6.25 mg total) by mouth 2 (two) times daily with a meal. 01/26/24   Atway, Rayann N, DO  cyclobenzaprine  (FLEXERIL ) 5 MG tablet Take 1 tablet (5 mg total) by mouth 3 (three) times daily as needed for muscle spasms. 04/01/23   Marilynne Rosaline SAILOR, MD  cycloSPORINE (RESTASIS) 0.05 % ophthalmic emulsion Place 1 drop into both eyes 2 (two) times daily.    [provider]  empagliflozin  (JARDIANCE ) 10 MG TABS tablet Take 1 tablet (10 mg total) by mouth daily. 01/29/24   Atway, Rayann N, DO  famotidine  (PEPCID ) 20 MG tablet TAKE 1 TABLET BY MOUTH TWICE A DAY 04/18/24   Zheng, Michael, DO  fluticasone  (FLONASE ) 50 MCG/ACT nasal spray SPRAY 1 SPRAY INTO BOTH NOSTRILS DAILY. Patient taking differently: Place 1 spray into both nostrils at bedtime. 11/05/21   Demaio, Alexa, MD  folic acid  (FOLVITE ) 1 MG tablet Take 1 tablet (1 mg total) by mouth daily. 05/09/24   Zheng, Michael, DO  Galcanezumab -gnlm (EMGALITY ) 120 MG/ML SOAJ Inject 120 mg into  the skin every 30 (thirty) days. 05/05/23   Gayland Lauraine PARAS, NP  levothyroxine  (SYNTHROID ) 100 MCG tablet Take 1 tablet (100 mcg total) by mouth daily. 02/27/24 02/26/25  Masters, Katie, DO  losartan  (COZAAR ) 100 MG tablet Take 1 tablet (100 mg total) by mouth daily. 05/25/23   Jinwala, Sagar H, MD  Metamucil Fiber CHEW Chew by mouth 2 (two) times daily.    [provider]  metFORMIN  (GLUCOPHAGE ) 1000 MG tablet TAKE 1 TABLET (1,000 MG TOTAL) BY MOUTH TWICE A DAY WITH FOOD 03/22/24   Zheng, Michael, DO  methotrexate  (RHEUMATREX) 2.5 MG tablet  Take 1 tablet (2.5 mg total) by mouth once a week. Caution:Chemotherapy. Protect from light. 05/18/24   Zheng, Michael, DO  Multiple Vitamin (MULTIVITAMIN WITH MINERALS) TABS tablet Take 1 tablet by mouth every evening.     [provider]  oxyCODONE  (ROXICODONE ) 15 MG immediate release tablet Take 1-2 tablets by mouth every 6 (six) hours. 06/02/22   [provider]  polyethylene glycol powder (GLYCOLAX /MIRALAX ) 17 GM/SCOOP powder Take 17 g by mouth daily. Drink 17g (1 scoop) dissolved in water per day. 10/14/23 10/08/24  Marilynne Rosaline SAILOR, MD  psyllium (METAMUCIL) 58.6 % packet Take 1 packet by mouth daily. 10/14/23   Marilynne Rosaline SAILOR, MD  tirzepatide  (MOUNJARO ) 5 MG/0.5ML Pen Inject 5 mg into the skin once a week. 06/28/24   Zheng, Michael, DO    Allergies: Lisinopril     Review of Systems  Constitutional:  Negative for chills and fever.  HENT:  Negative for ear pain and sore throat.   Eyes:  Negative for pain and visual disturbance.  Respiratory:  Negative for cough and shortness of breath.   Cardiovascular:  Positive for chest pain. Negative for palpitations.  Gastrointestinal:  Positive for abdominal distention and nausea. Negative for abdominal pain, diarrhea and vomiting.  Genitourinary:  Negative for dysuria and hematuria.  Musculoskeletal:  Positive for neck pain. Negative for arthralgias and back pain.  Skin:  Negative for color change and rash.  Neurological:  Positive for headaches. Negative for seizures and syncope.  All other systems reviewed and are negative.   Updated Vital Signs BP 124/82 (BP Location: Right Arm)   Pulse 87   Temp 98.2 F (36.8 C)   Resp 16   Ht 1.727 m (5' 8)   Wt 87.3 kg   LMP 12/03/2011   SpO2 100%   BMI 29.26 kg/m   Physical Exam Vitals and nursing note reviewed.  Constitutional:      General: She is not in acute distress.    Appearance: Normal appearance. She is well-developed.  HENT:     Head: Normocephalic and  atraumatic.     Mouth/Throat:     Mouth: Mucous membranes are moist.  Eyes:     Extraocular Movements: Extraocular movements intact.     Conjunctiva/sclera: Conjunctivae normal.     Pupils: Pupils are equal, round, and reactive to light.  Cardiovascular:     Rate and Rhythm: Normal rate and regular rhythm.     Heart sounds: No murmur heard. Pulmonary:     Effort: Pulmonary effort is normal. No respiratory distress.     Breath sounds: Normal breath sounds. No wheezing or rales.  Chest:     Chest wall: No tenderness.  Abdominal:     Palpations: Abdomen is soft.     Tenderness: There is no abdominal tenderness.  Musculoskeletal:        General: No swelling.  Cervical back: Normal range of motion and neck supple. No rigidity.     Right lower leg: No edema.     Left lower leg: No edema.  Skin:    General: Skin is warm and dry.     Capillary Refill: Capillary refill takes less than 2 seconds.  Neurological:     General: No focal deficit present.     Mental Status: She is alert and oriented to person, place, and time.  Psychiatric:        Mood and Affect: Mood normal.     (all labs ordered are listed, but only abnormal results are displayed) Labs Reviewed  CBC  BASIC METABOLIC PANEL WITH GFR  TROPONIN I (HIGH SENSITIVITY)  TROPONIN I (HIGH SENSITIVITY)    EKG: EKG Interpretation Date/Time:  Friday July 22 2024 16:38:49 EDT Ventricular Rate:  88 PR Interval:  124 QRS Duration:  76 QT Interval:  338 QTC Calculation: 408 R Axis:   27  Text Interpretation: Normal sinus rhythm Low voltage QRS Nonspecific ST and T wave abnormality Abnormal ECG When compared with ECG of 03-Jun-2018 07:55, PREVIOUS ECG IS PRESENT No significant change since last tracing Confirmed by Vikram Tillett [phone]) on 07/22/2024 7:08:46 PM  Radiology: [facility] Chest 2 View Result Date: 07/22/2024 CLINICAL DATA:  Chest pain. EXAM: CHEST - 2 VIEW COMPARISON:  May 09, 2020 FINDINGS: The heart size and  mediastinal contours are within normal limits. A stable, likely benign 2.6 cm opacity is seen projecting over the lateral aspect of the mid right lung on the frontal view. No acute infiltrate, pleural effusion or pneumothorax is identified. The visualized skeletal structures are unremarkable. IMPRESSION: No active cardiopulmonary disease. Electronically Signed   By: Suzen Dials M.D.   On: 07/22/2024 18:19     Procedures   Medications Ordered in the ED - No data to display                                  Medical Decision Making Amount and/or Complexity of Data Reviewed Radiology: ordered.  Risk Prescription drug management.   Labs done in triage CBC white count 5.3 hemoglobin 12.1 platelets 330.  Chest x-ray 2 view no active cardiopulmonary disease.  EKG without any acute findings.  Troponin pending basic metabolic panel pending.  Will get CT head and neck and will get CT abdomen and pelvis.  Patient metabolic panel normal.  Normal renal function normal electrolytes.  Troponin 3 and 2 so very reassuring.  CT head no acute intercranial abnormality no acute displaced fracture or injury to the cervical spine.  CT abdomen and pelvis constipation with fecalized material within the distal small bowel lumen may be due to an incompetent ileocecal valve versus slow transition no findings consistent with bowel obstruction no findings of hysterical coral colitis.  Will start the patient on MiraLAX .  And have him follow-up with her doctors.  No acute findings for her concerns.  The other thing is there could be a component of gastroesophageal reflux disease going on as well.  Make sure that she is on Prilosec or Pepcid .  Also peptic ulcer disease is not ruled out.  Final diagnoses:  Atypical chest pain  Lower abdominal pain    ED Discharge Orders     None          Geraldene Hamilton, MD 07/22/24 [id number]    Geraldene Hamilton, MD 07/22/24 2239

## 2024-07-24 NOTE — Progress Notes (Unsigned)
 Ellouise Console, PA-C [address] [place], [place]  [address] Phone: [phone]   Gastroenterology Consultation  Referring Provider:     Elicia Sharper, DO Primary Care Physician:  Elicia Sharper, DO Primary Gastroenterologist:  Ellouise Console, PA-C / Glendia Holt, MD  Reason for Consultation:     Chronic constipation, GERD, discuss EGD        HPI:   [name] [name] is a 58 y.o. y/o female referred for consultation & management  by Elicia Sharper, DO.  New patient.  Here to evaluate chronic constipation.  Also has history of GERD.  Previous patient of Dr. Rollin.  Last saw Dr. Rollin 11/2023.  She was referred to [facility] for a second opinion of her constipation.  She was diagnosed with dyssynergistic defecation and a 2 cm anterior rectocele.  Her last colonoscopy was 08/2022 and she was referred for biofeedback training.   08/2022 Colonoscopy by Dr. Myrick Keels at [facility]:  The colonoscopy was performed with moderate difficulty due to a tortuous colon. Successful completion of the procedure was aided by applying abdominal pressure. The patient tolerated the procedure well. The quality of the bowel preparation was fair. Good visualization acheived with irrigation of the colon.  The entire examined colon is normal. No polyps masses or diverticulosis noted. No specimens collected.   10-year repeat colonoscopy recommended (due 08/2032).  Had a recent ED evaluation for chest pain 07/22/2024.  Troponins negative.  EKG within normal limits.  Normal CBC and BMP with Hgb 12.1.  Diagnosed with GERD.  Currently on Prilosec and Pepcid .  Takes MiraLAX  for constipation.  Patient is requesting to have EGD to further evaluate noncardiac chest pain and acid reflux.  She takes ibuprofen  for headaches.  No other NSAID use.  Reports having peptic ulcer many years ago in her early 54s.  She has not had a EGD since then.  MiraLAX  is not working well, she still feels constipated.  She reports having rectocele  repair surgery by GYN a year ago.  Denies rectal bleeding or unintentional weight loss.  Has been on Mounjaro  for several months and constipation has been worse since then.  Also takes oxycodone  as needed for chronic pain.  07/22/2024 CT abdomen pelvis with contrast: Constipation with fecalized material within the distal small bowel lumen may be due to an incompetent ileocecal valve versus slow transition state. No findings of bowel obstruction. No findings of stercoral colitis.  07/2020: Defecogram at [facility]: 1.Puborectalis paradoxus during evacuation  2.Pelvic dyssynergia during the pull-up maneuver, and bear down  3.Anterior rectocele (2 cm) with significant postevacuation residual   10/2014 Colonoscopy by Dr. Rollin:  Colonoscopy showed torturous colon, the lumen of the colon was significantly dilated.  Fair / Good prep, no polyps.  10-year repeat due 10/2024.  PMH: Type 2 diabetes, nonischemic cardiomyopathy, chronic constipation, GERD, sleep apnea, hypothyroidism, hypertension, Hx thyroid  cancer, neurosarcoidosis.  Currently on Mounjaro .  Takes 81 mg aspirin  daily.  No other blood thinners.  08/2022 last echo showed normal LVEF 60 to 65%.  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   followed by pcp;    s/p total thyroidecotmy ,  hurthle cell caricnoma,  no radiation/ chemo   Nephrolithiasis    02-23-2023  currently has nonobstuctive stones   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    neurologist--- dr ines;   s/p suboccipital craniotomy resection tumor 10-30-2000 by dr alix ;  intermittant symptoms dizziness worsening nocturnal headaches/ migraines;  treated with methotrate   Nonischemic cardiomyopathy (HCC)    followed by cardiology---   cardiac MRI 01-27-2020 ef 44%;  echo 08-06-2022  ef 60%   Osteoporosis    Peripheral neuropathy    per pt intermittantly feet   Rectocele    Stress incontinence due to pelvic organ prolapse    Type 2 diabetes mellitus (HCC)    followed by pcp   (02-24-2023  per pt does not check blood sugar)   Wears contact lenses     Past Surgical History:  Procedure Laterality Date   BREAST EXCISIONAL BIOPSY Bilateral    yrs ago;  per pt benign   CESAREAN SECTION     x5   last one 1993   CRANIOTOMY POSTERIOR FOSSA  10/30/2000   @[facility]  by dr alix;   SUBOCCIPITAL CRANIOTOMY AND RESECTION OF TUMOR POSTERIOR FOSSA MASS   CYSTOSCOPY WITH RETROGRADE PYELOGRAM, URETEROSCOPY AND STENT PLACEMENT Left 08/15/2011   @[facility]  by dr aleene   CYSTOSCOPY/RETROGRADE/URETEROSCOPY/STONE EXTRACTION WITH BASKET  03/13/2006   @[facility]  by dr aleene   HYSTEROSCOPY W/ ENDOMETRIAL ABLATION  07/28/2007   @[facility]   by dr layman;   novasure ablation   LUMBAR LAMINECTOMY/DECOMPRESSION MICRODISCECTOMY Left 08/20/2016   Procedure: Left Lumbar four-five Microdiskectomy;  Surgeon: Catalina Stains, MD;  Location: [facility];  Service: Neurosurgery;  Laterality: Left;   RECTOCELE REPAIR N/A 02/26/2023   Procedure: POSTERIOR REPAIR (RECTOCELE);  Surgeon: Marilynne Rosaline SAILOR, MD;  Location: [facility];  Service: Gynecology;  Laterality: N/A;  Total time requested is 1 hour   ROTATOR CUFF REPAIR Right 07/2013   TOTAL ABDOMINAL HYSTERECTOMY  12/2021   @[facility] ;   w/  bilateral salpingectomy and c/s scar revision   TOTAL THYROIDECTOMY Bilateral 12/09/2010   @[facility]  by dr gail    Prior to Admission medications   Medication Sig Start Date End Date Taking? Authorizing Provider   ACCU-CHEK GUIDE test strip USE AS INSTRUCTED TO CHECK BLOOD SUGAR UP TO 1 TIME A DAY 06/02/22   Nguyen, Quan, DO  albuterol  (VENTOLIN  HFA) 108 (90 Base) MCG/ACT inhaler Inhale 2 puffs into the lungs every 6 (six) hours as needed for wheezing or shortness of breath. 05/16/20   Marsa Barter, MD  amLODipine  (NORVASC ) 5 MG tablet Take 1 tablet by mouth at bedtime.    [provider]  APPLE CIDER VINEGAR PO Take by mouth daily. chew    [provider]  aspirin  81 MG chewable tablet Chew 81 mg by mouth daily.    [provider]  atorvastatin  (LIPITOR) 20 MG tablet Take 1 tablet (20 mg total) by mouth at bedtime. 08/24/23   Zheng, Michael, DO  B-D ULTRAFINE III SHORT PEN 31G X 8 MM MISC USE TO INJECT INSULIN  1 TIME DAILY DIAG CODE E11.9 INSULIN  DEPENDENT 09/23/21   Demaio, Alexa, MD  blood glucose meter kit and supplies KIT Dispense based on patient and insurance preference. Use up  to four times daily as directed. (FOR ICD-9 250.00, 250.01). 06/15/19   Zenaida Clarissa POUR, MD  carvedilol  (COREG ) 6.25 MG tablet Take 1 tablet (6.25 mg total) by mouth 2 (two) times daily with a meal. 01/26/24   Atway, Rayann N, DO  cyclobenzaprine  (FLEXERIL ) 5 MG tablet Take 1 tablet (5 mg total) by mouth 3 (three) times daily as needed for muscle spasms. 04/01/23   Marilynne Rosaline SAILOR, MD  cycloSPORINE (RESTASIS) 0.05 % ophthalmic emulsion Place 1 drop into both eyes 2 (two) times daily.    [provider]  empagliflozin  (JARDIANCE ) 10 MG TABS tablet Take 1 tablet (10 mg total) by mouth daily. 01/29/24   Atway, Rayann N, DO  famotidine  (PEPCID ) 20 MG tablet TAKE 1 TABLET BY MOUTH TWICE A DAY 04/18/24   Zheng, Michael, DO  fluticasone  (FLONASE ) 50 MCG/ACT nasal spray SPRAY 1 SPRAY INTO BOTH NOSTRILS DAILY. Patient taking differently: Place 1 spray into both nostrils at bedtime. 11/05/21   Demaio, Alexa, MD  folic acid  (FOLVITE ) 1 MG tablet Take 1 tablet (1 mg total) by mouth daily. 05/09/24   Zheng,  Michael, DO  Galcanezumab -gnlm (EMGALITY ) 120 MG/ML SOAJ Inject 120 mg into the skin every 30 (thirty) days. 05/05/23   Gayland Lauraine PARAS, NP  levothyroxine  (SYNTHROID ) 100 MCG tablet Take 1 tablet (100 mcg total) by mouth daily. 02/27/24 02/26/25  Masters, Katie, DO  losartan  (COZAAR ) 100 MG tablet Take 1 tablet (100 mg total) by mouth daily. 05/25/23   Jinwala, Sagar H, MD  Metamucil Fiber CHEW Chew by mouth 2 (two) times daily.    [provider]  metFORMIN  (GLUCOPHAGE ) 1000 MG tablet TAKE 1 TABLET (1,000 MG TOTAL) BY MOUTH TWICE A DAY WITH FOOD 03/22/24   Zheng, Michael, DO  methotrexate  (RHEUMATREX) 2.5 MG tablet Take 1 tablet (2.5 mg total) by mouth once a week. Caution:Chemotherapy. Protect from light. 05/18/24   Zheng, Michael, DO  Multiple Vitamin (MULTIVITAMIN WITH MINERALS) TABS tablet Take 1 tablet by mouth every evening.     [provider]  omeprazole  (PRILOSEC) 20 MG capsule Take 1 capsule (20 mg total) by mouth 2 (two) times daily before a meal. 07/22/24   Zackowski, Scott, MD  oxyCODONE  (ROXICODONE ) 15 MG immediate release tablet Take 1-2 tablets by mouth every 6 (six) hours. 06/02/22   [provider]  polyethylene glycol powder (GLYCOLAX /MIRALAX ) 17 GM/SCOOP powder Take 17 g by mouth daily. Drink 17g (1 scoop) dissolved in water per day. 10/14/23 10/08/24  Marilynne Rosaline SAILOR, MD  psyllium (METAMUCIL) 58.6 % packet Take 1 packet by mouth daily. 10/14/23   Marilynne Rosaline SAILOR, MD  tirzepatide  (MOUNJARO ) 5 MG/0.5ML Pen Inject 5 mg into the skin once a week. 06/28/24   Elicia Sharper, DO    Family History  Problem Relation Age of Onset   Heart disease Mother        questionable CAD and arrythmias    Breast cancer Mother 52       recur 71   Diabetes Mother    Bone cancer Mother    Cirrhosis Father    Diabetes Father    Venous thrombosis Sister    Diabetes Sister    Heart disease Sister    Venous thrombosis Sister    Diabetes Sister    Heart disease Sister     Diabetes Brother    Heart disease Brother    Diabetes Brother    Diabetes Maternal Grandmother    Diabetes Paternal Grandmother  Breast cancer Maternal Aunt    Hypertension Son      Social History   Tobacco Use   Smoking status: Never   Smokeless tobacco: Never  Vaping Use   Vaping status: Never Used  Substance Use Topics   Alcohol use: No    Alcohol/week: 0.0 standard drinks of alcohol   Drug use: Never    Allergies as of 07/25/2024 - Review Complete 07/25/2024  Allergen Reaction Noted   Lisinopril  Cough 11/21/2020    Review of Systems:    All systems reviewed and negative except where noted in HPI.   Physical Exam:  BP 94/60 (BP Location: Left Arm, Patient Position: Sitting, Cuff Size: Normal)   Pulse 94   Ht 5' 7.5 (1.715 m) Comment: height measured without shoes  Wt 170 lb 4 oz (77.2 kg)   LMP 12/03/2011   BMI 26.27 kg/m  Patient's last menstrual period was 12/03/2011.  General:   Alert,  Well-developed, well-nourished, pleasant and cooperative in NAD Lungs:  Respirations even and unlabored.  Clear throughout to auscultation.   No wheezes, crackles, or rhonchi. No acute distress. Heart:  Regular rate and rhythm; no murmurs, clicks, rubs, or gallops. Abdomen:  Normal bowel sounds.  No bruits.  Soft, and non-distended without masses, hepatosplenomegaly or hernias noted.  Mild epigastric and bilateral lower abdominal tenderness.  No guarding or rebound tenderness.    Neurologic:  Alert and oriented x3;  grossly normal neurologically. Psych:  Alert and cooperative. Normal mood and affect.  Imaging Studies: CT ABDOMEN PELVIS W CONTRAST Result Date: 07/22/2024 CLINICAL DATA:  Abdominal pain, acute, nonlocalized EXAM: CT ABDOMEN AND PELVIS WITH CONTRAST TECHNIQUE: Multidetector CT imaging of the abdomen and pelvis was performed using the standard protocol following bolus administration of intravenous contrast. RADIATION DOSE REDUCTION: This exam was performed  according to the departmental dose-optimization program which includes automated exposure control, adjustment of the mA and/or kV according to patient size and/or use of iterative reconstruction technique. CONTRAST:  75mL OMNIPAQUE  IOHEXOL  350 MG/ML SOLN COMPARISON:  CT renal 06/08/2021 FINDINGS: Lower chest: No acute abnormality. Hepatobiliary: No focal liver abnormality. No gallstones, gallbladder wall thickening, or pericholecystic fluid. No biliary dilatation. Pancreas: No focal lesion. Normal pancreatic contour. No surrounding inflammatory changes. No main pancreatic ductal dilatation. Spleen: Normal in size without focal abnormality. Adrenals/Urinary Tract: No adrenal nodule bilaterally. Bilateral kidneys enhance symmetrically. No hydronephrosis. No hydroureter. The urinary bladder is unremarkable. On delayed imaging, there is no urothelial wall thickening and there are no filling defects in the opacified portions of the bilateral collecting systems or ureters. Stomach/Bowel: Stomach is within normal limits. No evidence of bowel wall thickening or dilatation. Fecalized material within the distal small bowel lumen may be due to an incompetent ileocecal valve versus slow transition state. Stool throughout the colon. Appendix appears normal. Vascular/Lymphatic: No abdominal aorta or iliac aneurysm. Mild atherosclerotic plaque of the aorta and its branches. No abdominal, pelvic, or inguinal lymphadenopathy. Reproductive: Status post hysterectomy. No adnexal masses. Other: No intraperitoneal free fluid. No intraperitoneal free gas. No organized fluid collection. Musculoskeletal: Tiny fat containing umbilical hernia. No suspicious lytic or blastic osseous lesions. No acute displaced fracture. IMPRESSION: Constipation with fecalized material within the distal small bowel lumen may be due to an incompetent ileocecal valve versus slow transition state. No findings of bowel obstruction. No findings of stercoral colitis.  Electronically Signed   By: Morgane  Naveau M.D.   On: 07/22/2024 21:45   CT Head Wo Contrast Result Date: 07/22/2024 CLINICAL  DATA:  Headache, new onset (Age >= 51y); Neck pain, acute, no red flags EXAM: CT HEAD WITHOUT CONTRAST CT CERVICAL SPINE WITHOUT CONTRAST TECHNIQUE: Multidetector CT imaging of the head and cervical spine was performed following the standard protocol without intravenous contrast. Multiplanar CT image reconstructions of the cervical spine were also generated. RADIATION DOSE REDUCTION: This exam was performed according to the departmental dose-optimization program which includes automated exposure control, adjustment of the mA and/or kV according to patient size and/or use of iterative reconstruction technique. COMPARISON:  MRI head 05/30/2023 images without report FINDINGS: CT HEAD FINDINGS Brain: No evidence of large-territorial acute infarction. No parenchymal hemorrhage. No mass lesion. No extra-axial collection. No mass effect or midline shift. No hydrocephalus. Basilar cisterns are patent. Posterior fossa midline posterior surgical clips. Vascular: No hyperdense vessel. Skull: Prior suboccipital craniectomy. Sinuses/Orbits: Paranasal sinuses and mastoid air cells are clear. The orbits are unremarkable. Other: None. CT CERVICAL SPINE FINDINGS Alignment: Normal. Skull base and vertebrae: No acute fracture. No aggressive appearing focal osseous lesion or focal pathologic process. Soft tissues and spinal canal: No prevertebral fluid or swelling. No visible canal hematoma. Upper chest: Unremarkable. Other: Thyroidectomy surgical changes. IMPRESSION: 1. No acute intracranial abnormality. 2. No acute displaced fracture or traumatic listhesis of the cervical spine. Electronically Signed   By: Morgane  Naveau M.D.   On: 07/22/2024 21:24   CT Cervical Spine Wo Contrast Result Date: 07/22/2024 CLINICAL DATA:  Headache, new onset (Age >= 51y); Neck pain, acute, no red flags EXAM: CT HEAD  WITHOUT CONTRAST CT CERVICAL SPINE WITHOUT CONTRAST TECHNIQUE: Multidetector CT imaging of the head and cervical spine was performed following the standard protocol without intravenous contrast. Multiplanar CT image reconstructions of the cervical spine were also generated. RADIATION DOSE REDUCTION: This exam was performed according to the departmental dose-optimization program which includes automated exposure control, adjustment of the mA and/or kV according to patient size and/or use of iterative reconstruction technique. COMPARISON:  MRI head 05/30/2023 images without report FINDINGS: CT HEAD FINDINGS Brain: No evidence of large-territorial acute infarction. No parenchymal hemorrhage. No mass lesion. No extra-axial collection. No mass effect or midline shift. No hydrocephalus. Basilar cisterns are patent. Posterior fossa midline posterior surgical clips. Vascular: No hyperdense vessel. Skull: Prior suboccipital craniectomy. Sinuses/Orbits: Paranasal sinuses and mastoid air cells are clear. The orbits are unremarkable. Other: None. CT CERVICAL SPINE FINDINGS Alignment: Normal. Skull base and vertebrae: No acute fracture. No aggressive appearing focal osseous lesion or focal pathologic process. Soft tissues and spinal canal: No prevertebral fluid or swelling. No visible canal hematoma. Upper chest: Unremarkable. Other: Thyroidectomy surgical changes. IMPRESSION: 1. No acute intracranial abnormality. 2. No acute displaced fracture or traumatic listhesis of the cervical spine. Electronically Signed   By: Morgane  Naveau M.D.   On: 07/22/2024 21:24   DG Chest 2 View Result Date: 07/22/2024 CLINICAL DATA:  Chest pain. EXAM: CHEST - 2 VIEW COMPARISON:  May 09, 2020 FINDINGS: The heart size and mediastinal contours are within normal limits. A stable, likely benign 2.6 cm opacity is seen projecting over the lateral aspect of the mid right lung on the frontal view. No acute infiltrate, pleural effusion or pneumothorax  is identified. The visualized skeletal structures are unremarkable. IMPRESSION: No active cardiopulmonary disease. Electronically Signed   By: Suzen Dials M.D.   On: 07/22/2024 18:19   MM 3D SCREENING MAMMOGRAM BILATERAL BREAST Result Date: 07/14/2024 CLINICAL DATA:  Screening. EXAM: DIGITAL SCREENING BILATERAL MAMMOGRAM WITH TOMOSYNTHESIS AND CAD TECHNIQUE: Bilateral screening digital craniocaudal  and mediolateral oblique mammograms were obtained. Bilateral screening digital breast tomosynthesis was performed. The images were evaluated with computer-aided detection. COMPARISON:  Previous exam(s). ACR Breast Density Category c: The breasts are heterogeneously dense, which may obscure small masses. FINDINGS: There are no findings suspicious for malignancy. IMPRESSION: No mammographic evidence of malignancy. A result letter of this screening mammogram will be mailed directly to the patient. RECOMMENDATION: Screening mammogram in one year. (Code:[id number]-B-01Y) BI-RADS CATEGORY  1: Negative. Electronically Signed   By: Toribio Agreste M.D.   On: 07/14/2024 10:11    Labs: CBC    Component Value Date/Time   WBC 5.3 07/22/2024 1656   RBC 4.43 07/22/2024 1656   HGB 12.1 07/22/2024 1656   HGB 12.5 01/26/2023 1400   HCT 39.4 07/22/2024 1656   HCT 39.9 01/26/2023 1400   PLT 330 07/22/2024 1656   PLT 344 01/26/2023 1400   MCV 88.9 07/22/2024 1656   MCV 85 01/26/2023 1400    CMP     Component Value Date/Time   NA 140 07/22/2024 1656   NA 142 10/26/2023 1104   K 4.2 07/22/2024 1656   CL 104 07/22/2024 1656   CO2 26 07/22/2024 1656   GLUCOSE 90 07/22/2024 1656   BUN 8 07/22/2024 1656   BUN 16 10/26/2023 1104   CREATININE 0.89 07/22/2024 1656   CREATININE 0.84 06/29/2014 1221   CALCIUM  8.8 (L) 07/22/2024 1656   PROT 7.0 01/26/2023 1400   ALBUMIN 4.2 01/26/2023 1400   AST 20 01/26/2023 1400   ALT 21 01/26/2023 1400   ALKPHOS 83 01/26/2023 1400   BILITOT 0.2 01/26/2023 1400   GFRNONAA >60  07/22/2024 1656   GFRNONAA 84 06/29/2014 1221   GFRAA 101 05/09/2020 0941   GFRAA >89 06/29/2014 1221    Assessment and Plan:   [name] [name] is a 58 y.o. y/o female has been referred for:   1.  Chronic constipation: For many years.  She has undergone extensive GI evaluation by Dr. Rollin and by Dr. Lenward Shelter at [facility] for chronic constipation.  Including defecography and pelvic floor physical therapy with biofeedback.  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 - For Constipation Try: Samples of Linzess 72 mcg QD for 1 week,  then 145 mcg QD for 1 week,  then 290 mcg QD for 1 week.   Please let me know which dose works best, and then we can send in a prescription.   - Recommend High Fiber diet with fruits, vegetables, and whole grains. - Drink 64 ounces of Fluids Daily.   2.  Rectocele: She reports having surgical repair approx. 1 year ago. - Followed by GYN surgeon.  3.  Chronic GERD / Epigastric Pain / Remote history of PUD age 55. - She is instructed to avoid Ibuprofen  and NSAIDS. - Scheduling EGD I discussed risks of EGD with patient to include risk of bleeding, perforation, and risk of sedation.  Patient expressed understanding and agrees to proceed with EGD.   4.  Colon cancer screening is up-to-date.  Negative colonoscopies 08/2022 and 10/2014. - 10-year repeat screening colonoscopy will be due 08/2032. - She will need extra 2-day prep for future procedures.  Follow up 4 weeks after procedure.  Ellouise Console, PA-C

## 2024-07-25 ENCOUNTER — Encounter: Payer: Self-pay | Admitting: Physician Assistant

## 2024-07-25 ENCOUNTER — Ambulatory Visit: Admitting: Physician Assistant

## 2024-07-25 VITALS — BP 94/60 | HR 94 | Ht 67.5 in | Wt 170.2 lb

## 2024-07-25 DIAGNOSIS — N816 Rectocele: Secondary | ICD-10-CM | POA: Diagnosis not present

## 2024-07-25 DIAGNOSIS — K219 Gastro-esophageal reflux disease without esophagitis: Secondary | ICD-10-CM | POA: Diagnosis not present

## 2024-07-25 DIAGNOSIS — K5909 Other constipation: Secondary | ICD-10-CM | POA: Diagnosis not present

## 2024-07-25 DIAGNOSIS — K5904 Chronic idiopathic constipation: Secondary | ICD-10-CM

## 2024-07-25 DIAGNOSIS — R1013 Epigastric pain: Secondary | ICD-10-CM | POA: Diagnosis not present

## 2024-07-25 MED ORDER — FAMOTIDINE 20 MG PO TABS: 20.0000 mg | ORAL_TABLET | Freq: Two times a day (BID) | ORAL | 3 refills | Status: DC

## 2024-07-25 MED ORDER — OMEPRAZOLE 20 MG PO CPDR: 20.0000 mg | DELAYED_RELEASE_CAPSULE | Freq: Two times a day (BID) | ORAL | 3 refills | Status: AC

## 2024-07-25 NOTE — Addendum Note (Signed)
 Addended by: LANETTE ALETHEA CROME on: 07/25/2024 02:05 PM   Modules accepted: Orders

## 2024-07-25 NOTE — Patient Instructions (Addendum)
 For Constipation Try: Samples of Linzess 72 mcg QD for 1 week,  then 145 mcg QD for 1 week,  then 290 mcg QD for 1 week.   Please let me know which dose works best, and then we can send in a prescription.    You have been scheduled for an Endoscopy. Please follow written instructions given to you at your visit today.  If you use inhalers (even only as needed), please bring them with you on the day of your procedure.  If you take any of the following medications, they will need to be adjusted prior to your procedure:   DO NOT TAKE 7 DAYS PRIOR TO TEST- Trulicity  (dulaglutide ) Ozempic, Wegovy (semaglutide) Mounjaro  (tirzepatide ) Bydureon  Bcise (exanatide extended release)  DO NOT TAKE 1 DAY PRIOR TO YOUR TEST Rybelsus (semaglutide) Adlyxin (lixisenatide) Victoza  (liraglutide ) Byetta  (exanatide) ___________________________________________________________________________  Please follow up sooner if symptoms increase or worsen  Due to recent changes in healthcare laws, you may see the results of your imaging and laboratory studies on MyChart before your provider has had a chance to review them.  We understand that in some cases there may be results that are confusing or concerning to you. Not all laboratory results come back in the same time frame and the provider may be waiting for multiple results in order to interpret others.  Please give us  48 hours in order for your provider to thoroughly review all the results before contacting the office for clarification of your results.   Thank you for trusting me with your gastrointestinal care!   Ellouise Console, PA-C _______________________________________________________  If your blood pressure at your visit was 140/90 or greater, please contact your primary care physician to follow up on this.  _______________________________________________________  If you are age 40 or older, your body mass index should be between 23-30. Your Body mass  index is 26.27 kg/m. If this is out of the aforementioned range listed, please consider follow up with your Primary Care Provider.  If you are age 8 or younger, your body mass index should be between 19-25. Your Body mass index is 26.27 kg/m. If this is out of the aformentioned range listed, please consider follow up with your Primary Care Provider.   ________________________________________________________  The [facility] providers would like to encourage you to use MYCHART to communicate with providers for non-urgent requests or questions.  Due to long hold times on the telephone, sending your provider a message by [facility] may be a faster and more efficient way to get a response.  Please allow 48 business hours for a response.  Please remember that this is for non-urgent requests.  _______________________________________________________

## 2024-07-27 ENCOUNTER — Encounter: Payer: Self-pay | Admitting: Gastroenterology

## 2024-07-27 ENCOUNTER — Ambulatory Visit: Admitting: Gastroenterology

## 2024-07-27 VITALS — BP 95/45 | HR 83 | Temp 98.2°F | Resp 11 | Ht 67.5 in | Wt 170.0 lb

## 2024-07-27 DIAGNOSIS — R1013 Epigastric pain: Secondary | ICD-10-CM

## 2024-07-27 DIAGNOSIS — K2950 Unspecified chronic gastritis without bleeding: Secondary | ICD-10-CM | POA: Diagnosis not present

## 2024-07-27 DIAGNOSIS — T182XXA Foreign body in stomach, initial encounter: Secondary | ICD-10-CM | POA: Diagnosis not present

## 2024-07-27 DIAGNOSIS — K219 Gastro-esophageal reflux disease without esophagitis: Secondary | ICD-10-CM

## 2024-07-27 DIAGNOSIS — R0789 Other chest pain: Secondary | ICD-10-CM | POA: Diagnosis not present

## 2024-07-27 DIAGNOSIS — B9681 Helicobacter pylori [H. pylori] as the cause of diseases classified elsewhere: Secondary | ICD-10-CM | POA: Diagnosis not present

## 2024-07-27 MED ORDER — SODIUM CHLORIDE 0.9 % IV SOLN: 500.0000 mL | Freq: Once | INTRAVENOUS | Status: DC

## 2024-07-27 NOTE — Patient Instructions (Signed)

## 2024-07-27 NOTE — Op Note (Addendum)
 [facility] Patient Name: Annebelle Bostic Procedure Date: 07/27/2024 8:31 AM MRN: [id number] Endoscopist: Glendia E. Stacia , MD, [id number] Age: 58 Referring MD:  Date of Birth: 09-25-1966 Gender: Female Account #: [id number] Procedure:                Upper GI endoscopy Indications:              Chest pain (non cardiac) Medicines:                Monitored Anesthesia Care Procedure:                Pre-Anesthesia Assessment:                           - Prior to the procedure, a History and Physical                            was performed, and patient medications and                            allergies were reviewed. The patient's tolerance of                            previous anesthesia was also reviewed. The risks                            and benefits of the procedure and the sedation                            options and risks were discussed with the patient.                            All questions were answered, and informed consent                            was obtained. Prior Anticoagulants: The patient has                            taken no anticoagulant or antiplatelet agents. ASA                            Grade Assessment: III - A patient with severe                            systemic disease. After reviewing the risks and                            benefits, the patient was deemed in satisfactory                            condition to undergo the procedure.                           After obtaining informed consent, the endoscope was  passed under direct vision. Throughout the                            procedure, the patient's blood pressure, pulse, and                            oxygen saturations were monitored continuously. The                            Olympus Scope [id number] was introduced through the                            mouth, and advanced to the second part of duodenum.                            The upper GI  endoscopy was accomplished without                            difficulty. The patient tolerated the procedure                            well. Scope In: Scope Out: Findings:                 The examined esophagus was normal. Biopsies were                            taken with a cold forceps for histology. Estimated                            blood loss was minimal.                           A medium amount of food (residue) was found in the                            gastric body and in the gastric antrum. This                            limited exam of some parts of the gastric body and                            antrum.                           The exam of the stomach was otherwise normal.                           Biopsies were taken with a cold forceps in the                            gastric antrum for Helicobacter pylori testing.                            Estimated blood  loss was minimal.                           The examined duodenum was normal. Complications:            No immediate complications. Estimated Blood Loss:     Estimated blood loss was minimal. Impression:               - Normal esophagus. Biopsied.                           - A medium amount of food (residue) in the stomach.                            This is likely related to Mounjaro .                           - Normal examined duodenum.                           - Biopsies were taken with a cold forceps for                            Helicobacter pylori testing.                           - No endoscopic abnormalities to explain patient's                            chest pain. Recommendation:           - Patient has a contact number available for                            emergencies. The signs and symptoms of potential                            delayed complications were discussed with the                            patient. Return to normal activities tomorrow.                            Written discharge  instructions were provided to the                            patient.                           - Resume previous diet.                           - Continue present medications.                           - Await pathology results.                           -  Follow up as needed in the office. Alverda Nazzaro E. Stacia, MD 07/27/2024 9:09:52 AM This report has been signed electronically.

## 2024-07-27 NOTE — Progress Notes (Signed)
Pt. states no medical or surgical changes since previsit or office visit. 

## 2024-07-27 NOTE — Progress Notes (Signed)
 History and Physical Interval Note:  07/27/2024 8:41 AM  [name] [name]  has presented today for endoscopic procedure(s), with the diagnosis of  Encounter Diagnoses  Name Primary?   Gastroesophageal reflux disease, unspecified whether esophagitis present Yes   Abdominal pain, epigastric   .  The various methods of evaluation and treatment have been discussed with the patient and/or family. After consideration of risks, benefits and other options for treatment, the patient has consented to  the endoscopic procedure(s).   The patient's history has been reviewed, patient examined, no change in status, stable for endoscopic procedure(s).  I have reviewed the patient's chart and labs.  Questions were answered to the patient's satisfaction.     Kiera Hussey E. Stacia, MD [facility]

## 2024-07-27 NOTE — Progress Notes (Signed)
 Vss nad trans to pacu

## 2024-07-27 NOTE — Progress Notes (Signed)
 Called to room to assist during endoscopic procedure.  Patient ID and intended procedure confirmed with present staff. Received instructions for my participation in the procedure from the performing physician.

## 2024-07-28 ENCOUNTER — Telehealth: Payer: Self-pay

## 2024-07-28 NOTE — Telephone Encounter (Signed)
  Follow up Call-     07/27/2024    8:22 AM  Call back number  Post procedure Call Back phone  # [phone]  Permission to leave phone message Yes     Patient questions:  Do you have a fever, pain , or abdominal swelling? No. Pain Score  0 *  Have you tolerated food without any problems? Yes.    Have you been able to return to your normal activities? Yes.    Do you have any questions about your discharge instructions: Diet   No. Medications  No. Follow up visit  No.  Do you have questions or concerns about your Care? No.  Actions: * If pain score is 4 or above: No action needed, pain <4.

## 2024-07-29 LAB — SURGICAL PATHOLOGY

## 2024-07-31 NOTE — Progress Notes (Signed)
 Agree with the assessment and plan as outlined by Brigitte Canard, PA-C.

## 2024-08-01 ENCOUNTER — Ambulatory Visit: Payer: Self-pay | Admitting: Gastroenterology

## 2024-08-01 NOTE — Progress Notes (Signed)
 Ms. Bezold,  The biopsies from your recent upper GI Endoscopy were notable for H. Pylori gastritis.  H. pylori is a common bacteria which can cause chronic symptoms of abdominal pain, nausea and bloating.  It can also cause iron deficiency anemia as well as increase the risk of stomach ulcers and stomach cancer.  We need to eradicate the bacteria.  Sometimes the bacteria is very difficult to eradicate, so it is  important to take the medications as directed. Please take the medications below:  1) Omeprazole  20 mg 2 times a day x 14 d 2) Pepto Bismol 2 tabs (262 mg each) 4 times a day x 14 d 3) Metronidazole  250 mg 4 times a day x 14 d 4) doxycycline  100 mg 2 times a day x 14 d  4 weeks after treatment completed, check H. Pylori stool antigen to confirm eradication (must be off acid suppression therapy for 2 weeks prior to specimen submission)  Dx: H. Pylori gastritis

## 2024-08-02 ENCOUNTER — Other Ambulatory Visit: Payer: Self-pay

## 2024-08-02 DIAGNOSIS — R1013 Epigastric pain: Secondary | ICD-10-CM

## 2024-08-02 DIAGNOSIS — A048 Other specified bacterial intestinal infections: Secondary | ICD-10-CM

## 2024-08-02 MED ORDER — BISMUTH 262 MG PO CHEW
524.0000 mg | CHEWABLE_TABLET | Freq: Four times a day (QID) | ORAL | 0 refills | Status: DC
Start: 2024-08-02 — End: 2024-10-19

## 2024-08-02 MED ORDER — OMEPRAZOLE 20 MG PO CPDR: 20.0000 mg | DELAYED_RELEASE_CAPSULE | Freq: Two times a day (BID) | ORAL | 0 refills | Status: AC

## 2024-08-02 MED ORDER — DOXYCYCLINE HYCLATE 100 MG PO CAPS: 100.0000 mg | ORAL_CAPSULE | Freq: Two times a day (BID) | ORAL | 0 refills | Status: AC

## 2024-08-02 MED ORDER — METRONIDAZOLE 250 MG PO TABS: 250.0000 mg | ORAL_TABLET | Freq: Four times a day (QID) | ORAL | 0 refills | Status: DC

## 2024-08-12 DIAGNOSIS — Z419 Encounter for procedure for purposes other than remedying health state, unspecified: Secondary | ICD-10-CM | POA: Diagnosis not present

## 2024-08-16 DIAGNOSIS — M25512 Pain in left shoulder: Secondary | ICD-10-CM | POA: Diagnosis not present

## 2024-08-16 DIAGNOSIS — M5136 Other intervertebral disc degeneration, lumbar region with discogenic back pain only: Secondary | ICD-10-CM | POA: Diagnosis not present

## 2024-08-16 DIAGNOSIS — M961 Postlaminectomy syndrome, not elsewhere classified: Secondary | ICD-10-CM | POA: Diagnosis not present

## 2024-08-22 ENCOUNTER — Ambulatory Visit: Payer: Self-pay | Admitting: Student

## 2024-08-22 NOTE — Progress Notes (Deleted)
 CC: ***  HPI: [name] [name] is a 58 y.o. female living with a history stated below and presents today for ***. Please see problem based assessment and plan for additional details.  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   followed by pcp;    s/p total thyroidecotmy ,  hurthle cell caricnoma,  no radiation/ chemo   Nephrolithiasis    02-23-2023  currently has nonobstuctive stones   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   neurologist--- dr ines;   s/p suboccipital craniotomy resection tumor 10-30-2000 by dr alix ;  intermittant symptoms dizziness worsening nocturnal headaches/ migraines;  treated with methotrate   Nonischemic cardiomyopathy (HCC)    followed by cardiology---   cardiac MRI 01-27-2020 ef 44%;  echo 08-06-2022  ef 60%   Osteoporosis    Peripheral neuropathy    per pt intermittantly feet   Rectocele    Stress incontinence due to pelvic organ prolapse    Type 2 diabetes mellitus (HCC)    followed by pcp   (02-24-2023  per pt does not check blood sugar)   Wears contact lenses     Current Outpatient Medications on File Prior to Visit  Medication Sig Dispense Refill   ACCU-CHEK GUIDE test strip USE AS INSTRUCTED TO CHECK BLOOD SUGAR UP TO 1 TIME A DAY 50 strip 7   albuterol  (VENTOLIN  HFA) 108 (90 Base) MCG/ACT inhaler Inhale 2 puffs into the lungs every 6 (six) hours as needed for wheezing or shortness of breath. 18 g 1   amLODipine  (NORVASC ) 5 MG tablet  Take 1 tablet by mouth at bedtime.     APPLE CIDER VINEGAR PO Take by mouth daily. chew (Patient not taking: Reported on 07/27/2024)     aspirin  81 MG chewable tablet Chew 81 mg by mouth daily.     atorvastatin  (LIPITOR) 20 MG tablet Take 1 tablet (20 mg total) by mouth at bedtime. 90 tablet 3   B-D ULTRAFINE III SHORT PEN 31G X 8 MM MISC USE TO INJECT INSULIN  1 TIME DAILY DIAG CODE E11.9 INSULIN  DEPENDENT (Patient not taking: Reported on 07/27/2024) 100 each 4   Bismuth  262 MG CHEW Chew 524 mg by mouth in the morning, at noon, in the evening, and at bedtime. 112 tablet 0   blood glucose meter kit and supplies KIT Dispense based on patient and insurance preference. Use up to four times daily as directed. (FOR ICD-9 250.00, 250.01). 1 each 0   carvedilol  (COREG ) 6.25 MG tablet Take 1 tablet (6.25 mg total) by mouth 2 (two) times daily with a meal. 180 tablet 3   cycloSPORINE (RESTASIS) 0.05 % ophthalmic emulsion Place 1 drop into both eyes 2 (two) times daily.     empagliflozin  (JARDIANCE ) 10 MG TABS tablet Take 1 tablet (10 mg total) by mouth daily. 90 tablet 3   famotidine  (PEPCID ) 20 MG tablet Take 1 tablet (20 mg total)  by mouth 2 (two) times daily. 180 tablet 3   fluticasone  (FLONASE ) 50 MCG/ACT nasal spray SPRAY 1 SPRAY INTO BOTH NOSTRILS DAILY. (Patient taking differently: Place 1 spray into both nostrils at bedtime.) 16 mL 5   folic acid  (FOLVITE ) 1 MG tablet Take 1 tablet (1 mg total) by mouth daily. 90 tablet 3   levothyroxine  (SYNTHROID ) 100 MCG tablet Take 1 tablet (100 mcg total) by mouth daily. 30 tablet 11   losartan  (COZAAR ) 100 MG tablet Take 1 tablet (100 mg total) by mouth daily. 90 tablet 3   Metamucil Fiber CHEW Chew by mouth 2 (two) times daily.     metFORMIN  (GLUCOPHAGE ) 1000 MG tablet TAKE 1 TABLET (1,000 MG TOTAL) BY MOUTH TWICE A DAY WITH FOOD 180 tablet 3   methotrexate  (RHEUMATREX) 2.5 MG tablet Take 1 tablet (2.5 mg total) by mouth once a week. Caution:Chemotherapy. Protect  from light. 12 tablet 3   metroNIDAZOLE  (FLAGYL ) 250 MG tablet Take 1 tablet (250 mg total) by mouth 4 (four) times daily. 56 tablet 0   Multiple Vitamin (MULTIVITAMIN WITH MINERALS) TABS tablet Take 1 tablet by mouth every evening.      omeprazole  (PRILOSEC) 20 MG capsule Take 1 capsule (20 mg total) by mouth 2 (two) times daily before a meal. 180 capsule 3   omeprazole  (PRILOSEC) 20 MG capsule Take 1 capsule (20 mg total) by mouth 2 (two) times daily before a meal. 28 capsule 0   oxyCODONE  (ROXICODONE ) 15 MG immediate release tablet Take 1-2 tablets by mouth every 6 (six) hours.     polyethylene glycol powder (GLYCOLAX /MIRALAX ) 17 GM/SCOOP powder Take 17 g by mouth daily. Drink 17g (1 scoop) dissolved in water per day. 510 g 11   psyllium (METAMUCIL) 58.6 % packet Take 1 packet by mouth daily. 30 each 11   tirzepatide  (MOUNJARO ) 5 MG/0.5ML Pen Inject 5 mg into the skin once a week. 2 mL 3   No current facility-administered medications on file prior to visit.    Family History  Problem Relation Age of Onset   Heart disease Mother        questionable CAD and arrythmias    Breast cancer Mother 8       recur 65   Diabetes Mother    Bone cancer Mother    Cirrhosis Father    Diabetes Father    Venous thrombosis Sister    Diabetes Sister    Heart disease Sister    Venous thrombosis Sister    Diabetes Sister    Heart disease Sister    Diabetes Brother    Heart disease Brother    Diabetes Brother    Breast cancer Maternal Aunt    Diabetes Maternal Grandmother    Diabetes Paternal Grandmother    Hypertension Son    Colon cancer Neg Hx    Colon polyps Neg Hx    Esophageal cancer Neg Hx    Rectal cancer Neg Hx    Stomach cancer Neg Hx     Social History   Socioeconomic History   Marital status: Married    Spouse name: [name]   Number of children: 4   Years of education: 12   Highest education level: Not on file  Occupational History    Employer: UNEMPLOYED  Tobacco  Use   Smoking status: Never   Smokeless tobacco: Never  Vaping Use   Vaping status: Never Used  Substance and Sexual Activity   Alcohol use: No  Alcohol/week: 0.0 standard drinks of alcohol   Drug use: Never   Sexual activity: Not on file    Comment: hysterectomy 2013  Other Topics Concern   Not on file  Social History Narrative   Lives in [place] alone    Drinks very little caffeine   Patient is right handed.    Social Drivers of Health   Financial Resource Strain: Patient Declined (02/22/2024)   Received from [facility]   Overall Financial Resource Strain (CARDIA)    Difficulty of Paying Living Expenses: Patient declined  Food Insecurity: Patient Declined (02/22/2024)   Received from [facility]   Hunger Vital Sign    Within the past 12 months, you worried that your food would run out before you got the money to buy more.: Patient declined    Within the past 12 months, the food you bought just didn't last and you didn't have money to get more.: Patient declined  Transportation Needs: Patient Declined (02/22/2024)   Received from [facility]   PRAPARE - Transportation    Lack of Transportation (Medical): Patient declined    Lack of Transportation (Non-Medical): Patient declined  Physical Activity: Not on file  Stress: Not on file  Social Connections: Moderately Integrated (12/09/2022)   Social Connection and Isolation Panel    Frequency of Communication with Friends and Family: More than three times a week    Frequency of Social Gatherings with Friends and Family: Three times a week    Attends Religious Services: 1 to 4 times per year    Active Member of Clubs or Organizations: No    Attends Banker Meetings: 1 to 4 times per year    Marital Status: Separated  Intimate Partner Violence: Not At Risk (12/09/2022)   Humiliation, Afraid, Rape, and Kick questionnaire    Fear of Current or Ex-Partner: No    Emotionally Abused: No    Physically Abused: No     Sexually Abused: No    Review of Systems: ROS negative except for what is noted on the assessment and plan.  There were no vitals filed for this visit.  Physical Exam  Physical Exam: Constitutional: well-appearing *** sitting in ***, in no acute distress HENT: normocephalic atraumatic, mucous membranes moist Eyes: conjunctiva non-erythematous Neck: supple Cardiovascular: regular rate and rhythm, no m/r/g Pulmonary/Chest: normal work of breathing on room air, lungs clear to auscultation bilaterally Abdominal: soft, non-tender, non-distended MSK: *** Neurological: alert & oriented x 3, 5/5 strength in bilateral upper and lower extremities, normal gait Skin: warm and dry Psych: ***  Assessment & Plan:   No problem-specific Assessment & Plan notes found for this encounter. PMH: HFrEF, HTN, migraines, OSA, rectocele, GERD, chronic constipation, hypothyroidism, type 2 diabetes, neurosarcoidosis  Methotrexate  2.5 mg Amlodipine  10 mg  Oxycodone  15 mg Metformin  1000 mg twice daily Jardiance  10 mg Losartan  100 mg Emgality  every 30 days Synthroid  125 mcg Mounjaro  2.5 mg weekly Atorvastatin  20 mg Carvedilol  6.25 mg twice daily Aspirin  81 mg Linzess   Hypothyroidism - Noted medication induced hyperthyroidism and levothyroxine  decreased to 100 mcg  HTN HFrEF -GDMT: Carvedilol  6.25 mg twice daily, losartan  100 mg, Jardiance  10 mg -Also on amlodipine *** -  T2DM. Status: ***. Last A1c of 6.3 in 01/2024.  A1c today is***.  Currently taking metformin  1 g twice daily, Jardiance  10 mg, Mounjaro  5 mg.  Patient *** hypoglycemia.  Per review of ***, average blood glucose is***.  Average fasting blood glucose is***. ***Foot exam.   Plan -  Continue *** -A1c *** -Ophthalmology exam: *** -Urine ACR *** -LDL *** on ***, taking ***    Care Tdap, eye, foot, A1c, flu  Patient {GC/GE:[id number]::discussed with,seen with} Dr. {[id number]::Tpoopjfd,Z.  Hoffman,Winfrey,Narendra,Chun,Chambliss,Lau,Machen}  Ozell Nearing, D.O. [facility] Internal Medicine, PGY-3 Phone: [phone] Date 08/22/2024 Time 11:50 AM

## 2024-08-24 ENCOUNTER — Telehealth: Payer: Self-pay

## 2024-08-24 ENCOUNTER — Other Ambulatory Visit: Payer: Self-pay | Admitting: Student

## 2024-08-24 ENCOUNTER — Ambulatory Visit: Admitting: Student

## 2024-08-24 ENCOUNTER — Encounter: Payer: Self-pay | Admitting: Student

## 2024-08-24 VITALS — BP 108/73 | HR 94 | Ht 67.5 in | Wt 171.6 lb

## 2024-08-24 DIAGNOSIS — Z23 Encounter for immunization: Secondary | ICD-10-CM | POA: Diagnosis not present

## 2024-08-24 DIAGNOSIS — Z7984 Long term (current) use of oral hypoglycemic drugs: Secondary | ICD-10-CM

## 2024-08-24 DIAGNOSIS — Z7985 Long-term (current) use of injectable non-insulin antidiabetic drugs: Secondary | ICD-10-CM

## 2024-08-24 DIAGNOSIS — E785 Hyperlipidemia, unspecified: Secondary | ICD-10-CM

## 2024-08-24 DIAGNOSIS — E119 Type 2 diabetes mellitus without complications: Secondary | ICD-10-CM

## 2024-08-24 DIAGNOSIS — A048 Other specified bacterial intestinal infections: Secondary | ICD-10-CM | POA: Diagnosis not present

## 2024-08-24 DIAGNOSIS — E890 Postprocedural hypothyroidism: Secondary | ICD-10-CM

## 2024-08-24 LAB — POCT GLYCOSYLATED HEMOGLOBIN (HGB A1C): HbA1c, POC (controlled diabetic range): 5.3 % (ref 0.0–7.0)

## 2024-08-24 LAB — GLUCOSE, CAPILLARY: Glucose-Capillary: 107 mg/dL — ABNORMAL HIGH (ref 70–99)

## 2024-08-24 MED ORDER — FOLIC ACID 1 MG PO TABS: 1.0000 mg | ORAL_TABLET | Freq: Every day | ORAL | 3 refills | Status: AC

## 2024-08-24 MED ORDER — COVID-19 MRNA VAC-TRIS(PFIZER) 30 MCG/0.3ML IM SUSY: 0.3000 mL | PREFILLED_SYRINGE | Freq: Once | INTRAMUSCULAR | 0 refills | Status: AC

## 2024-08-24 MED ORDER — EMPAGLIFLOZIN 10 MG PO TABS: 10.0000 mg | ORAL_TABLET | Freq: Every day | ORAL | 3 refills | Status: AC

## 2024-08-24 NOTE — Telephone Encounter (Signed)
 Medication sent to pharmacy

## 2024-08-24 NOTE — Assessment & Plan Note (Signed)
 Noted medication induced hyperthyroidism and levothyroxine  decreased to 100 mcg in AM. Confirmed she is taking 100 mcg and not 125 mcg. Will repeat TSH today.

## 2024-08-24 NOTE — Telephone Encounter (Signed)
 [name] [name] ([name]) PA Case ID #: [id number] Rx #: [id number] Need Help? Call us  at [phone] Outcome Approved today by [facility] Medicaid 2017 Approved. This drug has been approved. Approved quantity: 90 units per 90 day(s). The drug has been approved from 08/10/2024 to 08/24/2025. Please call the pharmacy to process your prescription claim. Generic or biosimilar substitution may be required when available and preferred on the formulary. Effective Date: 08/10/2024 Authorization Expiration Date: 08/24/2025 Drug Jardiance  10MG  tablets ePA cloud logo Form [employer] of [place]  Electronic Prior Authorization Request Form [id number] NCPDP) Original Claim Info 22

## 2024-08-24 NOTE — Progress Notes (Signed)
 CC: routine visit  HPI: [name] is a 58 y.o. female living with a history stated below and presents today for routine visit. Please see problem based assessment and plan for additional details.  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   followed by pcp;    s/p total thyroidecotmy ,  hurthle cell caricnoma,  no radiation/ chemo   Nephrolithiasis    02-23-2023  currently has nonobstuctive stones   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   neurologist--- dr [name] [name];   s/p suboccipital craniotomy resection tumor 10-30-2000 by dr [name] [name] ;  intermittant symptoms dizziness worsening nocturnal headaches/ migraines;  treated with methotrate   Nonischemic cardiomyopathy (HCC)    followed by cardiology---   cardiac MRI 01-27-2020 ef 44%;  echo 08-06-2022  ef 60%   Osteoporosis    Peripheral neuropathy    per pt intermittantly feet   Rectocele    Stress incontinence due to pelvic organ prolapse    Type 2 diabetes mellitus (HCC)    followed by pcp   (02-24-2023  per pt does not check blood sugar)   Wears contact lenses     Current Outpatient Medications on File Prior to Visit  Medication Sig Dispense Refill   ACCU-CHEK GUIDE test strip USE AS INSTRUCTED TO CHECK BLOOD SUGAR UP TO 1 TIME A DAY 50 strip 7   albuterol  (VENTOLIN  HFA) 108 (90 Base) MCG/ACT inhaler Inhale 2 puffs into the lungs every 6 (six) hours as needed for wheezing or shortness of breath. 18 g 1   amLODipine   (NORVASC ) 5 MG tablet Take 1 tablet by mouth at bedtime.     APPLE CIDER VINEGAR PO Take by mouth daily. chew (Patient not taking: Reported on 07/27/2024)     aspirin  81 MG chewable tablet Chew 81 mg by mouth daily.     atorvastatin  (LIPITOR) 20 MG tablet Take 1 tablet (20 mg total) by mouth at bedtime. 90 tablet 3   B-D ULTRAFINE III SHORT PEN 31G X 8 MM MISC USE TO INJECT INSULIN  1 TIME DAILY DIAG CODE E11.9 INSULIN  DEPENDENT (Patient not taking: Reported on 07/27/2024) 100 each 4   Bismuth  262 MG CHEW Chew 524 mg by mouth in the morning, at noon, in the evening, and at bedtime. 112 tablet 0   blood glucose meter kit and supplies KIT Dispense based on patient and insurance preference. Use up to four times daily as directed. (FOR ICD-9 250.00, 250.01). 1 each 0   carvedilol  (COREG ) 6.25 MG tablet Take 1 tablet (6.25 mg total) by mouth 2 (two) times daily with a meal. 180 tablet 3   cycloSPORINE (RESTASIS) 0.05 % ophthalmic emulsion Place 1 drop into both eyes 2 (two) times daily.     famotidine  (PEPCID ) 20 MG tablet Take 1 tablet (20 mg total) by mouth 2 (two) times daily. 180 tablet 3   fluticasone  (FLONASE ) 50 MCG/ACT nasal spray SPRAY  1 SPRAY INTO BOTH NOSTRILS DAILY. (Patient taking differently: Place 1 spray into both nostrils at bedtime.) 16 mL 5   levothyroxine  (SYNTHROID ) 100 MCG tablet Take 1 tablet (100 mcg total) by mouth daily. 30 tablet 11   losartan  (COZAAR ) 100 MG tablet Take 1 tablet (100 mg total) by mouth daily. 90 tablet 3   Metamucil Fiber CHEW Chew by mouth 2 (two) times daily.     metFORMIN  (GLUCOPHAGE ) 1000 MG tablet TAKE 1 TABLET (1,000 MG TOTAL) BY MOUTH TWICE A DAY WITH FOOD 180 tablet 3   methotrexate  (RHEUMATREX) 2.5 MG tablet Take 1 tablet (2.5 mg total) by mouth once a week. Caution:Chemotherapy. Protect from light. 12 tablet 3   metroNIDAZOLE  (FLAGYL ) 250 MG tablet Take 1 tablet (250 mg total) by mouth 4 (four) times daily. 56 tablet 0   Multiple Vitamin (MULTIVITAMIN  WITH MINERALS) TABS tablet Take 1 tablet by mouth every evening.      omeprazole  (PRILOSEC) 20 MG capsule Take 1 capsule (20 mg total) by mouth 2 (two) times daily before a meal. 180 capsule 3   omeprazole  (PRILOSEC) 20 MG capsule Take 1 capsule (20 mg total) by mouth 2 (two) times daily before a meal. 28 capsule 0   oxyCODONE  (ROXICODONE ) 15 MG immediate release tablet Take 1-2 tablets by mouth every 6 (six) hours.     polyethylene glycol powder (GLYCOLAX /MIRALAX ) 17 GM/SCOOP powder Take 17 g by mouth daily. Drink 17g (1 scoop) dissolved in water per day. 510 g 11   psyllium (METAMUCIL) 58.6 % packet Take 1 packet by mouth daily. 30 each 11   tirzepatide  (MOUNJARO ) 5 MG/0.5ML Pen Inject 5 mg into the skin once a week. 2 mL 3   No current facility-administered medications on file prior to visit.    Family History  Problem Relation Age of Onset   Heart disease Mother        questionable CAD and arrythmias    Breast cancer Mother 23       recur 89   Diabetes Mother    Bone cancer Mother    Cirrhosis Father    Diabetes Father    Venous thrombosis Sister    Diabetes Sister    Heart disease Sister    Venous thrombosis Sister    Diabetes Sister    Heart disease Sister    Diabetes Brother    Heart disease Brother    Diabetes Brother    Breast cancer Maternal Aunt    Diabetes Maternal Grandmother    Diabetes Paternal Grandmother    Hypertension Son    Colon cancer Neg Hx    Colon polyps Neg Hx    Esophageal cancer Neg Hx    Rectal cancer Neg Hx    Stomach cancer Neg Hx     Social History   Socioeconomic History   Marital status: Married    Spouse name: [name] [name]   Number of children: 4   Years of education: 12   Highest education level: Not on file  Occupational History    Employer: UNEMPLOYED  Tobacco Use   Smoking status: Never   Smokeless tobacco: Never  Vaping Use   Vaping status: Never Used  Substance and Sexual Activity   Alcohol use: No    Alcohol/week:  0.0 standard drinks of alcohol   Drug use: Never   Sexual activity: Not on file    Comment: hysterectomy 2013  Other Topics Concern   Not on file  Social History Narrative  Lives in [place] alone    Drinks very little caffeine   Patient is right handed.    Social Drivers of Health   Financial Resource Strain: Patient Declined (02/22/2024)   Received from [facility]   Overall Financial Resource Strain (CARDIA)    Difficulty of Paying Living Expenses: Patient declined  Food Insecurity: Patient Declined (02/22/2024)   Received from [facility]   Hunger Vital Sign    Within the past 12 months, you worried that your food would run out before you got the money to buy more.: Patient declined    Within the past 12 months, the food you bought just didn't last and you didn't have money to get more.: Patient declined  Transportation Needs: Patient Declined (02/22/2024)   Received from [facility]   PRAPARE - Transportation    Lack of Transportation (Medical): Patient declined    Lack of Transportation (Non-Medical): Patient declined  Physical Activity: Not on file  Stress: Not on file  Social Connections: Moderately Integrated (12/09/2022)   Social Connection and Isolation Panel    Frequency of Communication with Friends and Family: More than three times a week    Frequency of Social Gatherings with Friends and Family: Three times a week    Attends Religious Services: 1 to 4 times per year    Active Member of Clubs or Organizations: No    Attends Banker Meetings: 1 to 4 times per year    Marital Status: Separated  Intimate Partner Violence: Not At Risk (12/09/2022)   Humiliation, Afraid, Rape, and Kick questionnaire    Fear of Current or Ex-Partner: No    Emotionally Abused: No    Physically Abused: No    Sexually Abused: No    Review of Systems: ROS negative except for what is noted on the assessment and plan.  Vitals:   08/24/24 0857  BP: 108/73  Pulse: 94   SpO2: 97%  Weight: 171 lb 9.6 oz (77.8 kg)  Height: 5' 7.5 (1.715 m)   Physical Exam: Constitutional: well-appearing female sitting in chair, in no acute distress Cardiovascular: regular rate and rhythm Pulmonary/Chest: normal work of breathing on room air MSK: Foot: no open wounds/cuts, palpable DP pulses bilaterally, warm and dry Neurological: alert & oriented x 3  Assessment & Plan:   Assessment & Plan Controlled type 2 diabetes mellitus without complication, without long-term current use of insulin  (HCC) Status: controlled. Last A1c of 6.3 in 01/2024.  A1c today is 5.3.  Currently taking metformin  1 g twice daily, Jardiance  10 mg, Mounjaro  5 mg.  Completed foot exam today.   Plan -Continue metformin , Jardiance  and Mounjaro   -A1c adjusted to every 6 months  -Ophthalmology exam: [place], last visit 2024 -Urine ACR normal, 10/2023 -LDL 75 on 10/2023, taking atorvastatin  20 mg, can repeat at next OV Postoperative hypothyroidism Noted medication induced hyperthyroidism and levothyroxine  decreased to 100 mcg in AM. Confirmed she is taking 100 mcg and not 125 mcg. Will repeat TSH today.  Encounter for immunization Received flu and Tdap today. Covid Rx printed for patient to get at pharmacy.  H. pylori infection Noted on recent EGD. Reports finishing quadruple therapy. Will need to collect stool to confirm eradication. Patient is aware to wait 2 weeks after therapy completion.    Return in about 3 months (around 11/23/2024) for routine visit.   Patient discussed with Dr. CHARLENA Rosan Ozell Elicia, D.O. [facility] Internal Medicine, PGY-3 Phone: [phone] Date 08/24/2024 Time 1:38 PM

## 2024-08-24 NOTE — Assessment & Plan Note (Signed)
 Status: controlled. Last A1c of 6.3 in 01/2024.  A1c today is 5.3.  Currently taking metformin  1 g twice daily, Jardiance  10 mg, Mounjaro  5 mg.  Completed foot exam today.   Plan -Continue metformin , Jardiance  and Mounjaro   -A1c adjusted to every 6 months  -Ophthalmology exam: [place], last visit 2024 -Urine ACR normal, 10/2023 -LDL 75 on 10/2023, taking atorvastatin  20 mg, can repeat at next OV

## 2024-08-24 NOTE — Telephone Encounter (Addendum)
 Prior Authorization for patient (Jardiance  10MG  tablets) came through on cover my meds was submitted with last office notes and labs awaiting approval or denial.  XZB:[id number]

## 2024-08-24 NOTE — Patient Instructions (Addendum)
 Thank you, [name] for allowing us  to provide your care today. Today we discussed:  -A1c doing great, 5.3% -Complete your treatment for H pylori (bacteria in stomach). Check in with your GI doctor since you will need to recheck stool sample.  -Medications refilled and sent.  -Blood work to check thyroid  today.  -Received flu and tetanus shot today. Sent prescription for COVID vaccine.   [facility] neurologist [facility] clinic in [place], [place]  Address: [address], [place], [place] [address] Phone: [phone]  I have ordered the following medication/changed the following medications:  Start the following medications: Meds ordered this encounter  Medications   COVID-19 mRNA vaccine, Pfizer, (COMIRNATY) syringe    Sig: Inject 0.3 mLs into the muscle once for 1 dose.    Dispense:  0.3 mL    Refill:  0    Approved at provider discretion.  Product selection permitted.   empagliflozin  (JARDIANCE ) 10 MG TABS tablet    Sig: Take 1 tablet (10 mg total) by mouth daily.    Dispense:  90 tablet    Refill:  3   folic acid  (FOLVITE ) 1 MG tablet    Sig: Take 1 tablet (1 mg total) by mouth daily.    Dispense:  90 tablet    Refill:  3     Follow up: 3 months   Should you have any questions or concerns please call the internal medicine clinic at [phone].    Burnette Sautter, D.O. [facility]

## 2024-08-24 NOTE — Assessment & Plan Note (Signed)
 Noted on recent EGD. Reports finishing quadruple therapy. Will need to collect stool to confirm eradication. Patient is aware to wait 2 weeks after therapy completion.

## 2024-08-25 ENCOUNTER — Ambulatory Visit: Payer: Self-pay | Admitting: Student

## 2024-08-25 LAB — TSH: TSH: 1.47 u[IU]/mL (ref 0.450–4.500)

## 2024-08-29 NOTE — Progress Notes (Signed)
 Internal Medicine Clinic Attending  Case discussed with the resident at the time of the visit.  We reviewed the resident's history and exam and pertinent patient test results.  I agree with the assessment, diagnosis, and plan of care documented in the resident's note.

## 2024-08-31 ENCOUNTER — Ambulatory Visit: Admitting: Student

## 2024-09-13 DIAGNOSIS — M79662 Pain in left lower leg: Secondary | ICD-10-CM | POA: Diagnosis not present

## 2024-09-13 DIAGNOSIS — M5136 Other intervertebral disc degeneration, lumbar region with discogenic back pain only: Secondary | ICD-10-CM | POA: Diagnosis not present

## 2024-09-13 DIAGNOSIS — M25512 Pain in left shoulder: Secondary | ICD-10-CM | POA: Diagnosis not present

## 2024-09-23 ENCOUNTER — Other Ambulatory Visit: Payer: Medicaid Other

## 2024-09-27 ENCOUNTER — Other Ambulatory Visit

## 2024-09-27 DIAGNOSIS — A048 Other specified bacterial intestinal infections: Secondary | ICD-10-CM

## 2024-09-27 DIAGNOSIS — R1013 Epigastric pain: Secondary | ICD-10-CM

## 2024-09-29 LAB — H. PYLORI ANTIGEN, STOOL: H pylori Ag, Stl: NEGATIVE

## 2024-10-04 ENCOUNTER — Ambulatory Visit: Payer: Self-pay | Admitting: Gastroenterology

## 2024-10-04 NOTE — Progress Notes (Signed)
 [name] [name],  Your H pylori test was negative, indicating that the bacteria was successfully eradicated with the antibiotics.  Have your symptoms improved?

## 2024-10-13 ENCOUNTER — Other Ambulatory Visit: Payer: Self-pay

## 2024-10-13 DIAGNOSIS — E890 Postprocedural hypothyroidism: Secondary | ICD-10-CM

## 2024-10-13 MED ORDER — LEVOTHYROXINE SODIUM 100 MCG PO TABS: 100.0000 ug | ORAL_TABLET | Freq: Every day | ORAL | 11 refills | Status: AC

## 2024-10-13 NOTE — Telephone Encounter (Signed)
 Last rx was written by a provider who is no longer with [facility]. Pharmacy is requesting a new rx.

## 2024-10-16 ENCOUNTER — Other Ambulatory Visit: Payer: Self-pay | Admitting: Student

## 2024-10-16 DIAGNOSIS — E119 Type 2 diabetes mellitus without complications: Secondary | ICD-10-CM

## 2024-10-17 NOTE — Telephone Encounter (Signed)
 Patient has medicaid. Medicaid no longer covers GLP-1. I called the patient to see if she would like to come in to discuss alternative weight loss options/ placing a referral to healthy weight and wellness. I was unable to reach the patient, I lvm for patient to give us  a call back.

## 2024-10-19 ENCOUNTER — Ambulatory Visit: Payer: Self-pay | Admitting: Student

## 2024-10-19 VITALS — BP 110/72 | HR 84 | Temp 98.3°F | Ht 67.0 in | Wt 179.0 lb

## 2024-10-19 DIAGNOSIS — D8689 Sarcoidosis of other sites: Secondary | ICD-10-CM

## 2024-10-19 DIAGNOSIS — I10 Essential (primary) hypertension: Secondary | ICD-10-CM | POA: Diagnosis not present

## 2024-10-19 DIAGNOSIS — Z79899 Other long term (current) drug therapy: Secondary | ICD-10-CM

## 2024-10-19 DIAGNOSIS — E119 Type 2 diabetes mellitus without complications: Secondary | ICD-10-CM

## 2024-10-19 DIAGNOSIS — Z833 Family history of diabetes mellitus: Secondary | ICD-10-CM | POA: Diagnosis not present

## 2024-10-19 DIAGNOSIS — Z8249 Family history of ischemic heart disease and other diseases of the circulatory system: Secondary | ICD-10-CM | POA: Diagnosis not present

## 2024-10-19 DIAGNOSIS — Z7985 Long-term (current) use of injectable non-insulin antidiabetic drugs: Secondary | ICD-10-CM

## 2024-10-19 MED ORDER — SEMAGLUTIDE (1 MG/DOSE) 4 MG/3ML ~~LOC~~ SOPN: 1.0000 mg | PEN_INJECTOR | SUBCUTANEOUS | 0 refills | Status: DC

## 2024-10-19 MED ORDER — SEMAGLUTIDE(0.25 OR 0.5MG/DOS) 2 MG/3ML ~~LOC~~ SOPN: 0.5000 mg | PEN_INJECTOR | SUBCUTANEOUS | 0 refills | Status: DC

## 2024-10-19 MED ORDER — SEMAGLUTIDE(0.25 OR 0.5MG/DOS) 2 MG/3ML ~~LOC~~ SOPN: 0.2500 mg | PEN_INJECTOR | SUBCUTANEOUS | 0 refills | Status: AC

## 2024-10-19 MED ORDER — OZEMPIC (2 MG/DOSE) 8 MG/3ML ~~LOC~~ SOPN: 2.0000 mg | PEN_INJECTOR | SUBCUTANEOUS | 3 refills | Status: DC

## 2024-10-19 NOTE — Patient Instructions (Addendum)
 Call the neurology office for a follow-up appointment for your neurosarcoidosis: [phone]  Remember to bring all of the medications that you take (including over the counter medications and supplements) with you to every clinic visit.  This after visit summary is an important review of tests, referrals, and medication changes that were discussed during your visit. If you have questions or concerns, call [phone]. Outside of clinic business hours, call the main hospital at [phone] and ask the operator for the on-call internal medicine resident.   Ozell Kung MD 10/19/2024, 2:57 PM

## 2024-10-19 NOTE — Progress Notes (Unsigned)
 Internal Medicine Clinic Attending  Case discussed with the resident at the time of the visit.  We reviewed the resident's history and exam and pertinent patient test results.  I agree with the assessment, diagnosis, and plan of care documented in the resident's note.

## 2024-10-19 NOTE — Assessment & Plan Note (Signed)
 SABRA

## 2024-10-19 NOTE — Assessment & Plan Note (Signed)
  Orders:   Semaglutide ,0.25 or 0.5MG /DOS, 2 MG/3ML SOPN; Inject 0.25 mg into the skin once a week for 28 days.   Semaglutide ,0.25 or 0.5MG /DOS, 2 MG/3ML SOPN; Inject 0.5 mg into the skin once a week for 28 days.   Semaglutide , 1 MG/DOSE, 4 MG/3ML SOPN; Inject 1 mg into the skin once a week for 27 days.   Semaglutide , 2 MG/DOSE, (OZEMPIC , 2 MG/DOSE,) 8 MG/3ML SOPN; Inject 2 mg into the skin once a week.   Microalbumin / creatinine urine ratio

## 2024-10-19 NOTE — Progress Notes (Unsigned)
 Patient name: [name] [name] Date of birth: May 31, 1966 Date of visit: 10/20/24  Subjective:  Reason for visit: Follow-up (Follow up on medication that her insurance will not cover / medication refill)  Current Outpatient Medications  Medication Instructions   ACCU-CHEK GUIDE test strip USE AS INSTRUCTED TO CHECK BLOOD SUGAR UP TO 1 TIME A DAY   albuterol  (VENTOLIN  HFA) 108 (90 Base) MCG/ACT inhaler 2 puffs, Inhalation, Every 6 hours PRN   amLODipine  (NORVASC ) 5 MG tablet 1 tablet, Daily at bedtime   APPLE CIDER VINEGAR PO Daily   aspirin  81 mg, Daily   atorvastatin  (LIPITOR) 20 MG tablet TAKE 1 TABLET BY MOUTH EVERYDAY AT BEDTIME   B-D ULTRAFINE III SHORT PEN 31G X 8 MM MISC USE TO INJECT INSULIN  1 TIME DAILY DIAG CODE E11.9 INSULIN  DEPENDENT   Bismuth  524 mg, Oral, 4 times daily   blood glucose meter kit and supplies KIT Dispense based on patient and insurance preference. Use up to four times daily as directed. (FOR ICD-9 250.00, 250.01).   carvedilol  (COREG ) 6.25 mg, Oral, 2 times daily with meals   cycloSPORINE (RESTASIS) 0.05 % ophthalmic emulsion 1 drop, 2 times daily   empagliflozin  (JARDIANCE ) 10 mg, Oral, Daily   famotidine  (PEPCID ) 20 mg, Oral, 2 times daily   fluticasone  (FLONASE ) 50 MCG/ACT nasal spray SPRAY 1 SPRAY INTO BOTH NOSTRILS DAILY.   folic acid  (FOLVITE ) 1 mg, Oral, Daily   levothyroxine  (SYNTHROID ) 100 mcg, Oral, Daily   losartan  (COZAAR ) 100 mg, Oral, Daily   Metamucil Fiber CHEW 2 times daily   metFORMIN  (GLUCOPHAGE ) 1000 MG tablet TAKE 1 TABLET (1,000 MG TOTAL) BY MOUTH TWICE A DAY WITH FOOD   methotrexate  (RHEUMATREX) 2.5 mg, Oral, Weekly, Caution:Chemotherapy. Protect from light.   metroNIDAZOLE  (FLAGYL ) 250 mg, Oral, 4 times daily   Mounjaro  5 mg, Subcutaneous, Weekly   Multiple Vitamin (MULTIVITAMIN WITH MINERALS) TABS tablet 1 tablet, Every evening   omeprazole  (PRILOSEC) 20 mg, Oral, 2 times daily before meals   omeprazole  (PRILOSEC) 20 mg, Oral, 2  times daily before meals   oxyCODONE  (ROXICODONE ) 15 MG immediate release tablet 1-2 tablets, Every 6 hours   psyllium (METAMUCIL) 58.6 % packet 1 packet, Oral, Daily    Objective: Today's Vitals   10/19/24 1438  BP: 110/72  Pulse: 84  Temp: 98.3 F (36.8 C)  TempSrc: Oral  SpO2: 98%  Weight: 179 lb (81.2 kg)  Height: 5' 7 (1.702 m)  PainSc: 6   PainLoc: Groin  Body mass index is 28.04 kg/m.   Physical Exam Constitutional:      Appearance: Normal appearance.  Cardiovascular:     Rate and Rhythm: Normal rate and regular rhythm.     Heart sounds: No murmur heard. Pulmonary:     Effort: Pulmonary effort is normal. No respiratory distress.     Breath sounds: No wheezing.  Musculoskeletal:     Right lower leg: No edema.     Left lower leg: No edema.  Skin:    General: Skin is warm and dry.  Neurological:     Mental Status: She is alert.     Cranial Nerves: No facial asymmetry.  Psychiatric:        Mood and Affect: Affect normal.        Speech: Speech normal.        Behavior: Behavior normal.     Assessment & Plan Controlled diabetes mellitus type 2, (HCC) Lab Results  Component Value Date   HGBA1C 5.3 08/24/2024  HGBA1C 6.3 (A) 01/26/2024   HGBA1C 6.1 (A) 10/26/2023   Chronic, stable. Well-controlled. Continue metformin  1000 mg bid, jardiance  10 mg daily. Mounjaro  not covered by insurance so resuming Ozempic. Orders:   Semaglutide,0.25 or 0.5MG /DOS, 2 MG/3ML SOPN; Inject 0.25 mg into the skin once a week for 28 days.   Semaglutide,0.25 or 0.5MG /DOS, 2 MG/3ML SOPN; Inject 0.5 mg into the skin once a week for 28 days.   Semaglutide, 1 MG/DOSE, 4 MG/3ML SOPN; Inject 1 mg into the skin once a week for 27 days.   Semaglutide, 2 MG/DOSE, (OZEMPIC, 2 MG/DOSE,) 8 MG/3ML SOPN; Inject 2 mg into the skin once a week.   Microalbumin / creatinine urine ratio   Essential hypertension BP Readings from Last 3 Encounters:  10/19/24 110/72  08/24/24 108/73  07/27/24 (!)  95/45   Chronic, stable. Continue losartan  100 mg daily, Coreg  6.25 mg twice daily, and amlodipine  5 mg daily.     Neurosarcoidosis (HCC) Chronic, stable. Seems to have been in remission for a long time per prior neurology notes. She gets yearly surveillance brain MRI but hasn't had one this year. On a low dose of methotrexate , 2.5 mg weekly for this. We have been refilling. It would be good if she could re-establish with a neurology practice for surveillance of her neurosarcoidosis. Contact information for the last practice she saw was provided and she was encouraged to call for follow-up appointment.      Return in about 3 months (around 01/19/2025).  Ozell Kung MD 10/20/2024, 9:08 AM

## 2024-10-20 ENCOUNTER — Telehealth: Payer: Self-pay

## 2024-10-20 LAB — MICROALBUMIN / CREATININE URINE RATIO
Creatinine, Urine: 53.6 mg/dL
Microalb/Creat Ratio: <6 mg/g{creat} (ref 0–29)
Microalbumin, Urine: <3 ug/mL

## 2024-10-20 NOTE — Telephone Encounter (Signed)
 [name] [name] (Key: [id number]) Rx #: [id number] Ozempic (0.25 or 0.5 MG/DOSE) 2MG /3ML pen-injectors Form [employer] of [place]  Electronic Prior Authorization Request Form (2017 NCPDP) Created 17 hours ago Sent to Plan 17 hours ago Plan Response 17 hours ago Submit Clinical Questions 8 minutes ago Determination Favorable 3 minutes ago Message from Plan Approved. This drug has been approved. Approved quantity: 3 units per 28 day(s). The drug has been approved from 10/20/2024 to 10/20/2025. Please call the pharmacy to process your prescription claim. Generic or biosimilar substitution may be required when available and preferred on the formulary.. Authorization Expiration Date: October 20, 2025.   Lvm regarding the approval.

## 2024-10-20 NOTE — Telephone Encounter (Signed)
 Prior Authorization for patient (Ozempic  (0.25 or 0.5 MG/DOSE) 2MG /3ML pen-injectors) came through on cover my meds was submitted with last office notes and labs awaiting approval or denial.  XZB:[id number]

## 2024-10-21 ENCOUNTER — Ambulatory Visit: Payer: Self-pay | Admitting: Student

## 2024-11-14 ENCOUNTER — Other Ambulatory Visit: Payer: Self-pay | Admitting: Student

## 2024-11-14 ENCOUNTER — Telehealth: Payer: Self-pay | Admitting: *Deleted

## 2024-11-14 DIAGNOSIS — I10 Essential (primary) hypertension: Secondary | ICD-10-CM

## 2024-11-14 MED ORDER — CARVEDILOL 6.25 MG PO TABS: 6.2500 mg | ORAL_TABLET | Freq: Two times a day (BID) | ORAL | 3 refills | Status: AC

## 2024-11-14 NOTE — Progress Notes (Signed)
 CRM Telephone Note Communication  Reason for CRM: Patient passed by the pharmacy for carvedilol  (COREG ) 6.25 MG tablet    However she was advised that the insurance will not the prescription if its set to twice a day. She would like it to be changed to once a day so it can get accepted.      She also states that her Mounjaro  was changed to ozempic  but she doesn't like how ozempic  makes her stomach feel and would like to be placed on mounjaro  again    I spoke w/ [facility] and current Rx for carvedilol  was prescribed by a former resident, which is why there is an issue w/ dispensing it. I resent Rx for Carvedilol  6.25 mg BID to pharmacy. Attempted call to patient but no response. Wanted to discuss Mounjaro  since LOV mentioned Mounjaro  no longer being covered by her insurance and that was the reason for switching to Ozempic . Will attempt 2nd call tomorrow.

## 2024-11-17 ENCOUNTER — Other Ambulatory Visit: Payer: Self-pay | Admitting: Student

## 2024-11-17 ENCOUNTER — Telehealth: Payer: Self-pay

## 2024-11-17 ENCOUNTER — Telehealth: Payer: Self-pay | Admitting: *Deleted

## 2024-11-17 DIAGNOSIS — E119 Type 2 diabetes mellitus without complications: Secondary | ICD-10-CM

## 2024-11-17 MED ORDER — MOUNJARO 2.5 MG/0.5ML ~~LOC~~ SOAJ: 2.5000 mg | SUBCUTANEOUS | 1 refills | Status: AC

## 2024-11-17 NOTE — Telephone Encounter (Signed)
 Copied from CRM #[id number]. Topic: Clinical - Medication Refill >> Nov 17, 2024 10:54 AM DeAngela L wrote: Medication: empagliflozin  (JARDIANCE ) 10 MG TABS tablet  Has the patient contacted their pharmacy? No  (Agent: If no, request that the patient contact the pharmacy for the refill. If patient does not wish to contact the pharmacy document the reason why and proceed with request.) (Agent: If yes, when and what did the pharmacy advise?)  This is the patient's preferred pharmacy:  [id number] [place], [place] - [address] AT [facility] [address] [place] [address] Phone: [phone] Fax: [phone]  Is this the correct pharmacy for this prescription? Yes  If no, delete pharmacy and type the correct one.   Has the prescription been filled recently? Yes   Is the patient out of the medication? Yes   Has the patient been seen for an appointment in the last year OR does the patient have an upcoming appointment? Yes   Can we respond through MyChart? Yes   Agent: Please be advised that Rx refills may take up to 3 business days. We ask that you follow-up with your pharmacy.

## 2024-11-17 NOTE — Telephone Encounter (Signed)
 Copied from CRM #[id number]. Topic: Clinical - Prescription Issue >> Nov 17, 2024 10:49 AM DeAngela L wrote: Reason for CRM: th patient states she previously was taking Monjaro and the insurance provider covers this and this only needs a Prior Auth since this is for her diabetes   Pt num  [id number]  Patient state when she is taking OZEMPIC  and this is making her feel hungry all the time and sometimes this makes her stomach hurt a little and she is wondering why the medication was changed from Monjaro to ozempic , and the patient would like to go back to the Monjaro and her A1C level was better with Monjaro also

## 2024-11-17 NOTE — Telephone Encounter (Signed)
 [name] [name] (Key: [id number]) Rx #: [id number] Mounjaro  2.5MG /0.5ML auto-injectors Form [employer] of [place]  Electronic Prior Authorization Request Form [id number] NCPDP) Created 1 hour ago Sent to Plan 35 minutes ago Plan Response 34 minutes ago Submit Clinical Questions 31 minutes ago Determination Favorable 14 minutes ago Message from Plan Approved. This drug has been approved. Approved quantity: 2 milliliters per 28 day(s). You may fill up to a 34 day supply at a retail pharmacy. You may fill up to a 90 day supply for maintenance drugs, please refer to the formulary for details. Please call the pharmacy to process your prescription claim.. Authorization Expiration Date: November 17, 2025.  Patient is aware of approval

## 2024-11-17 NOTE — Addendum Note (Signed)
 Addended by: ELICIA SHARPER on: 11/17/2024 11:30 AM   Modules accepted: Orders

## 2024-11-17 NOTE — Telephone Encounter (Signed)
 Prior Authorization for patient (Mounjaro  2.5MG /0.5ML auto-injectors) came through on cover my meds was submitted with last office notes and labs awaiting approval or denial.  KEY:[id number]

## 2024-11-17 NOTE — Telephone Encounter (Signed)
 Called pt - no answer; left message of office's return call.   I called [facility] about Coreg  rx - stated for unknown reason the rx was corrupted but she able to retrieve it and ran it thru again; it will be ready later today.  Pt was called again to informed her of Coreg  rx. Pt also stated Ozempic  hurts her stomach, makes her hungry and gassy and keeps her AIC level down.  She wants to go back on Mounjaro  Per Dr Hildy note, I told pt Mounjaro  is not covered by her insurance - pt stated it is covered, just needs a prior authorization. I told her I will inform her doctor.

## 2024-11-17 NOTE — Telephone Encounter (Signed)
 Jada, PA might be needed for Mounjaro . Thanks

## 2024-11-17 NOTE — Telephone Encounter (Signed)
 Prior Authorization has been submitted awaiting approval or denial.

## 2024-11-17 NOTE — Telephone Encounter (Signed)
 im

## 2024-11-17 NOTE — Telephone Encounter (Signed)
 There are refills on Jardiance ; called pt . Asked pt to call [facility] pharmacy - stated she will.

## 2024-11-17 NOTE — Telephone Encounter (Signed)
 Copied from CRM #[id number]. Topic: Clinical - Prescription Issue >> Nov 14, 2024 12:39 PM Debby BROCKS wrote: Reason for CRM: Patient passed by the pharmacy for carvedilol  (COREG ) 6.25 MG tablet However she was advised that the insurance will not the prescription if its set to twice a day. She would like it to be changed to once a day so it can get accepted.  She also states that her Mounjaro  was changed to ozempic  but she doesn't like how ozempic  makes her stomach feel and would like to be placed on mounjaro  again >> Nov 17, 2024 10:49 AM DeAngela L wrote: The patient spoke with the pharmacy today and the explained that the prescription can't be filled cause the doctor is not a medicaid doctor so he can't call the medicine in  >> Nov 14, 2024  3:53 PM Nurse Kenneth DEL, RN wrote: Will froward to Dr. Elicia.

## 2024-11-21 ENCOUNTER — Telehealth: Payer: Self-pay | Admitting: *Deleted

## 2024-11-21 NOTE — Telephone Encounter (Signed)
 I talked to pt who stated Jardiance  will cost $2000.00 d/t prescriber not being medicaid approved.  Pt stated she got the Mounjaro  and they gave her Coreg  6.25 mg BID. I called [facility]talked to [facility] - they had an [facility] rx for Jardiance  by Dr Jimmy. Informed them Dr Elicia sent rx in September - she found the rx, ran it thru; cost will be $4 - pt was called and informed,

## 2024-11-21 NOTE — Telephone Encounter (Signed)
 Copied from CRM #[id number]. Topic: Clinical - Prescription Issue >> Nov 14, 2024 12:39 PM Debby BROCKS wrote: Reason for CRM: Patient passed by the pharmacy for carvedilol  (COREG ) 6.25 MG tablet However she was advised that the insurance will not the prescription if its set to twice a day. She would like it to be changed to once a day so it can get accepted.  She also states that her Mounjaro  was changed to ozempic  but she doesn't like how ozempic  makes her stomach feel and would like to be placed on mounjaro  again >> Nov 18, 2024  2:27 PM Fredrica W wrote: Patient called. Was seen and they werer supposed to fix her medications. States she reached out to pharmacy - they wanted to charge her full [place] for Jardiance  due to provider not enrolled with medication. She also received a text - unclear or missing info for Rx - request clarification from provider. Dose was due yesterday.  >> Nov 17, 2024 10:49 AM DeAngela L wrote: The patient spoke with the pharmacy today and the explained that the prescription can't be filled cause the doctor is not a medicaid doctor so he can't call the medicine in  >> Nov 14, 2024  3:53 PM Nurse Kenneth DEL, RN wrote: Will froward to Dr. Elicia.

## 2025-01-19 ENCOUNTER — Ambulatory Visit: Admitting: Student
